# Patient Record
Sex: Female | Born: 1950 | Race: White | Hispanic: No | Marital: Married | Smoking: Former smoker
Health system: Southern US, Community
[De-identification: ages and names within clinical notes are randomized; demographics above are authoritative.]

## PROBLEM LIST (undated history)

## (undated) DIAGNOSIS — Z8619 Personal history of other infectious and parasitic diseases: Secondary | ICD-10-CM

## (undated) DIAGNOSIS — R112 Nausea with vomiting, unspecified: Secondary | ICD-10-CM

## (undated) DIAGNOSIS — K219 Gastro-esophageal reflux disease without esophagitis: Secondary | ICD-10-CM

## (undated) DIAGNOSIS — L4050 Arthropathic psoriasis, unspecified: Secondary | ICD-10-CM

## (undated) DIAGNOSIS — M459 Ankylosing spondylitis of unspecified sites in spine: Secondary | ICD-10-CM

## (undated) DIAGNOSIS — J984 Other disorders of lung: Secondary | ICD-10-CM

## (undated) DIAGNOSIS — M8580 Other specified disorders of bone density and structure, unspecified site: Secondary | ICD-10-CM

## (undated) DIAGNOSIS — N6320 Unspecified lump in the left breast, unspecified quadrant: Secondary | ICD-10-CM

## (undated) DIAGNOSIS — F329 Major depressive disorder, single episode, unspecified: Secondary | ICD-10-CM

## (undated) DIAGNOSIS — C801 Malignant (primary) neoplasm, unspecified: Secondary | ICD-10-CM

## (undated) DIAGNOSIS — I10 Essential (primary) hypertension: Secondary | ICD-10-CM

## (undated) DIAGNOSIS — F419 Anxiety disorder, unspecified: Secondary | ICD-10-CM

## (undated) DIAGNOSIS — G4733 Obstructive sleep apnea (adult) (pediatric): Secondary | ICD-10-CM

## (undated) DIAGNOSIS — Z9889 Other specified postprocedural states: Secondary | ICD-10-CM

## (undated) DIAGNOSIS — F429 Obsessive-compulsive disorder, unspecified: Secondary | ICD-10-CM

## (undated) DIAGNOSIS — F32A Depression, unspecified: Secondary | ICD-10-CM

## (undated) DIAGNOSIS — K449 Diaphragmatic hernia without obstruction or gangrene: Secondary | ICD-10-CM

## (undated) DIAGNOSIS — I493 Ventricular premature depolarization: Secondary | ICD-10-CM

## (undated) DIAGNOSIS — Z8719 Personal history of other diseases of the digestive system: Secondary | ICD-10-CM

## (undated) DIAGNOSIS — Z85828 Personal history of other malignant neoplasm of skin: Secondary | ICD-10-CM

## (undated) DIAGNOSIS — G2401 Drug induced subacute dyskinesia: Secondary | ICD-10-CM

## (undated) DIAGNOSIS — M069 Rheumatoid arthritis, unspecified: Secondary | ICD-10-CM

## (undated) DIAGNOSIS — G4700 Insomnia, unspecified: Secondary | ICD-10-CM

## (undated) DIAGNOSIS — D649 Anemia, unspecified: Secondary | ICD-10-CM

## (undated) DIAGNOSIS — R0600 Dyspnea, unspecified: Secondary | ICD-10-CM

## (undated) DIAGNOSIS — M797 Fibromyalgia: Secondary | ICD-10-CM

## (undated) DIAGNOSIS — E785 Hyperlipidemia, unspecified: Secondary | ICD-10-CM

## (undated) HISTORY — DX: Other specified disorders of bone density and structure, unspecified site: M85.80

## (undated) HISTORY — DX: Fibromyalgia: M79.7

## (undated) HISTORY — PX: SPINAL FIXATION SURGERY W/ IMPLANT: SHX785

## (undated) HISTORY — DX: Diaphragmatic hernia without obstruction or gangrene: K44.9

## (undated) HISTORY — PX: BREAST EXCISIONAL BIOPSY: SUR124

## (undated) HISTORY — DX: Ankylosing spondylitis of unspecified sites in spine: M45.9

## (undated) HISTORY — DX: Other disorders of lung: J98.4

## (undated) HISTORY — DX: Insomnia, unspecified: G47.00

## (undated) HISTORY — DX: Ventricular premature depolarization: I49.3

## (undated) HISTORY — DX: Arthropathic psoriasis, unspecified: L40.50

## (undated) HISTORY — DX: Hyperlipidemia, unspecified: E78.5

## (undated) HISTORY — DX: Essential (primary) hypertension: I10

## (undated) HISTORY — DX: Gastro-esophageal reflux disease without esophagitis: K21.9

## (undated) HISTORY — DX: Depression, unspecified: F32.A

## (undated) HISTORY — DX: Obsessive-compulsive disorder, unspecified: F42.9

## (undated) HISTORY — DX: Rheumatoid arthritis, unspecified: M06.9

## (undated) HISTORY — DX: Drug induced subacute dyskinesia: G24.01

## (undated) HISTORY — DX: Major depressive disorder, single episode, unspecified: F32.9

## (undated) HISTORY — DX: Malignant (primary) neoplasm, unspecified: C80.1

---

## 1973-05-25 HISTORY — PX: BREAST SURGERY: SHX581

## 1989-05-25 HISTORY — PX: BUNIONECTOMY: SHX129

## 1993-05-25 HISTORY — PX: EYE SURGERY: SHX253

## 1995-05-26 HISTORY — PX: SHOULDER SURGERY: SHX246

## 1998-05-25 HISTORY — PX: CATARACT EXTRACTION W/ INTRAOCULAR LENS  IMPLANT, BILATERAL: SHX1307

## 1999-01-01 ENCOUNTER — Encounter: Payer: Self-pay | Admitting: Ophthalmology

## 1999-01-01 ENCOUNTER — Ambulatory Visit (HOSPITAL_COMMUNITY): Admission: RE | Admit: 1999-01-01 | Discharge: 1999-01-02 | Payer: Self-pay | Admitting: Ophthalmology

## 1999-10-27 ENCOUNTER — Ambulatory Visit (HOSPITAL_COMMUNITY): Admission: RE | Admit: 1999-10-27 | Discharge: 1999-10-28 | Payer: Self-pay | Admitting: Ophthalmology

## 1999-10-27 HISTORY — PX: OTHER SURGICAL HISTORY: SHX169

## 2000-07-27 ENCOUNTER — Encounter: Payer: Self-pay | Admitting: Family Medicine

## 2000-07-27 ENCOUNTER — Encounter: Admission: RE | Admit: 2000-07-27 | Discharge: 2000-07-27 | Payer: Self-pay | Admitting: Family Medicine

## 2000-08-03 ENCOUNTER — Encounter: Payer: Self-pay | Admitting: Family Medicine

## 2000-08-03 ENCOUNTER — Encounter: Admission: RE | Admit: 2000-08-03 | Discharge: 2000-08-03 | Payer: Self-pay | Admitting: Family Medicine

## 2000-09-09 ENCOUNTER — Other Ambulatory Visit: Admission: RE | Admit: 2000-09-09 | Discharge: 2000-09-09 | Payer: Self-pay | Admitting: *Deleted

## 2000-09-22 ENCOUNTER — Encounter: Payer: Self-pay | Admitting: *Deleted

## 2000-09-22 ENCOUNTER — Encounter: Admission: RE | Admit: 2000-09-22 | Discharge: 2000-09-22 | Payer: Self-pay | Admitting: *Deleted

## 2000-11-15 ENCOUNTER — Other Ambulatory Visit: Admission: RE | Admit: 2000-11-15 | Discharge: 2000-11-15 | Payer: Self-pay | Admitting: *Deleted

## 2000-11-15 ENCOUNTER — Encounter (INDEPENDENT_AMBULATORY_CARE_PROVIDER_SITE_OTHER): Payer: Self-pay

## 2003-04-11 ENCOUNTER — Ambulatory Visit (HOSPITAL_COMMUNITY): Admission: RE | Admit: 2003-04-11 | Discharge: 2003-04-11 | Payer: Self-pay | Admitting: Gastroenterology

## 2003-04-25 ENCOUNTER — Ambulatory Visit (HOSPITAL_COMMUNITY): Admission: RE | Admit: 2003-04-25 | Discharge: 2003-04-25 | Payer: Self-pay | Admitting: Specialist

## 2003-10-31 ENCOUNTER — Ambulatory Visit (HOSPITAL_BASED_OUTPATIENT_CLINIC_OR_DEPARTMENT_OTHER): Admission: RE | Admit: 2003-10-31 | Discharge: 2003-10-31 | Payer: Self-pay | Admitting: Otolaryngology

## 2004-02-20 ENCOUNTER — Encounter: Admission: RE | Admit: 2004-02-20 | Discharge: 2004-02-20 | Payer: Self-pay | Admitting: *Deleted

## 2004-03-03 ENCOUNTER — Observation Stay (HOSPITAL_COMMUNITY): Admission: RE | Admit: 2004-03-03 | Discharge: 2004-03-04 | Payer: Self-pay | Admitting: Orthopedic Surgery

## 2004-03-03 HISTORY — PX: KNEE ARTHROSCOPY: SUR90

## 2004-03-18 ENCOUNTER — Encounter: Admission: RE | Admit: 2004-03-18 | Discharge: 2004-03-18 | Payer: Self-pay | Admitting: *Deleted

## 2005-11-10 ENCOUNTER — Encounter: Admission: RE | Admit: 2005-11-10 | Discharge: 2005-11-10 | Payer: Self-pay | Admitting: Family Medicine

## 2005-12-10 ENCOUNTER — Encounter: Admission: RE | Admit: 2005-12-10 | Discharge: 2005-12-10 | Payer: Self-pay | Admitting: Gastroenterology

## 2005-12-14 ENCOUNTER — Inpatient Hospital Stay (HOSPITAL_COMMUNITY): Admission: RE | Admit: 2005-12-14 | Discharge: 2005-12-17 | Payer: Self-pay | Admitting: Orthopedic Surgery

## 2005-12-14 HISTORY — PX: TOTAL KNEE ARTHROPLASTY: SHX125

## 2006-02-04 ENCOUNTER — Ambulatory Visit (HOSPITAL_COMMUNITY): Admission: RE | Admit: 2006-02-04 | Discharge: 2006-02-04 | Payer: Self-pay | Admitting: Gastroenterology

## 2006-03-26 ENCOUNTER — Encounter: Admission: RE | Admit: 2006-03-26 | Discharge: 2006-03-26 | Payer: Self-pay | Admitting: Otolaryngology

## 2006-04-26 ENCOUNTER — Encounter (INDEPENDENT_AMBULATORY_CARE_PROVIDER_SITE_OTHER): Payer: Self-pay | Admitting: Specialist

## 2006-04-26 ENCOUNTER — Ambulatory Visit (HOSPITAL_COMMUNITY): Admission: RE | Admit: 2006-04-26 | Discharge: 2006-04-27 | Payer: Self-pay | Admitting: Otolaryngology

## 2006-04-26 HISTORY — PX: UVULOPALATOPHARYNGOPLASTY: SHX827

## 2006-06-25 LAB — HM DEXA SCAN

## 2006-07-02 ENCOUNTER — Encounter: Admission: RE | Admit: 2006-07-02 | Discharge: 2006-07-02 | Payer: Self-pay | Admitting: Family Medicine

## 2006-07-02 LAB — HM DEXA SCAN

## 2008-04-17 LAB — HM PAP SMEAR: HM Pap smear: NORMAL

## 2008-04-23 ENCOUNTER — Encounter: Admission: RE | Admit: 2008-04-23 | Discharge: 2008-04-23 | Payer: Self-pay | Admitting: Orthopaedic Surgery

## 2008-09-04 ENCOUNTER — Inpatient Hospital Stay (HOSPITAL_COMMUNITY): Admission: EM | Admit: 2008-09-04 | Discharge: 2008-09-07 | Payer: Self-pay | Admitting: Emergency Medicine

## 2009-01-03 ENCOUNTER — Encounter: Admission: RE | Admit: 2009-01-03 | Discharge: 2009-01-03 | Payer: Self-pay | Admitting: Orthopedic Surgery

## 2009-02-15 ENCOUNTER — Encounter: Admission: RE | Admit: 2009-02-15 | Discharge: 2009-02-15 | Payer: Self-pay | Admitting: Orthopedic Surgery

## 2009-03-01 ENCOUNTER — Encounter: Admission: RE | Admit: 2009-03-01 | Discharge: 2009-03-01 | Payer: Self-pay | Admitting: Family Medicine

## 2009-03-12 ENCOUNTER — Encounter: Admission: RE | Admit: 2009-03-12 | Discharge: 2009-03-12 | Payer: Self-pay | Admitting: Family Medicine

## 2010-01-13 ENCOUNTER — Ambulatory Visit: Payer: Self-pay | Admitting: Cardiology

## 2010-06-06 ENCOUNTER — Encounter
Admission: RE | Admit: 2010-06-06 | Discharge: 2010-06-06 | Payer: Self-pay | Source: Home / Self Care | Attending: Family Medicine | Admitting: Family Medicine

## 2010-06-06 LAB — HM MAMMOGRAPHY: HM Mammogram: NORMAL

## 2010-06-15 ENCOUNTER — Encounter: Payer: Self-pay | Admitting: Otolaryngology

## 2010-09-03 LAB — BASIC METABOLIC PANEL
BUN: 2 mg/dL — ABNORMAL LOW (ref 6–23)
BUN: 4 mg/dL — ABNORMAL LOW (ref 6–23)
BUN: 7 mg/dL (ref 6–23)
CO2: 28 mEq/L (ref 19–32)
CO2: 30 mEq/L (ref 19–32)
CO2: 32 mEq/L (ref 19–32)
Calcium: 8 mg/dL — ABNORMAL LOW (ref 8.4–10.5)
Calcium: 8.4 mg/dL (ref 8.4–10.5)
Calcium: 8.5 mg/dL (ref 8.4–10.5)
Chloride: 101 mEq/L (ref 96–112)
Chloride: 102 mEq/L (ref 96–112)
Chloride: 98 mEq/L (ref 96–112)
Creatinine, Ser: 0.56 mg/dL (ref 0.4–1.2)
Creatinine, Ser: 0.57 mg/dL (ref 0.4–1.2)
Creatinine, Ser: 0.65 mg/dL (ref 0.4–1.2)
GFR calc Af Amer: >60 mL/min (ref >60)
GFR calc Af Amer: >60 mL/min (ref >60)
GFR calc Af Amer: >60 mL/min (ref >60)
GFR calc non Af Amer: >60 mL/min (ref >60)
GFR calc non Af Amer: >60 mL/min (ref >60)
GFR calc non Af Amer: >60 mL/min (ref >60)
Glucose, Bld: 106 mg/dL — ABNORMAL HIGH (ref 70–99)
Glucose, Bld: 123 mg/dL — ABNORMAL HIGH (ref 70–99)
Glucose, Bld: 125 mg/dL — ABNORMAL HIGH (ref 70–99)
Potassium: 3.5 mEq/L (ref 3.5–5.1)
Potassium: 3.7 mEq/L (ref 3.5–5.1)
Potassium: 3.8 mEq/L (ref 3.5–5.1)
Sodium: 133 mEq/L — ABNORMAL LOW (ref 135–145)
Sodium: 136 mEq/L (ref 135–145)
Sodium: 137 mEq/L (ref 135–145)

## 2010-09-03 LAB — CBC
HCT: 28.6 % — ABNORMAL LOW (ref 36.0–46.0)
HCT: 30.5 % — ABNORMAL LOW (ref 36.0–46.0)
HCT: 31.9 % — ABNORMAL LOW (ref 36.0–46.0)
Hemoglobin: 10 g/dL — ABNORMAL LOW (ref 12.0–15.0)
Hemoglobin: 10.7 g/dL — ABNORMAL LOW (ref 12.0–15.0)
Hemoglobin: 10.9 g/dL — ABNORMAL LOW (ref 12.0–15.0)
MCHC: 34.4 g/dL (ref 30.0–36.0)
MCHC: 34.9 g/dL (ref 30.0–36.0)
MCHC: 35 g/dL (ref 30.0–36.0)
MCV: 94.7 fL (ref 78.0–100.0)
MCV: 95 fL (ref 78.0–100.0)
MCV: 96.1 fL (ref 78.0–100.0)
Platelets: 207 10*3/uL (ref 150–400)
Platelets: 217 10*3/uL (ref 150–400)
Platelets: 218 10*3/uL (ref 150–400)
RBC: 3.01 MIL/uL — ABNORMAL LOW (ref 3.87–5.11)
RBC: 3.22 MIL/uL — ABNORMAL LOW (ref 3.87–5.11)
RBC: 3.32 MIL/uL — ABNORMAL LOW (ref 3.87–5.11)
RDW: 13.7 % (ref 11.5–15.5)
RDW: 13.7 % (ref 11.5–15.5)
RDW: 13.9 % (ref 11.5–15.5)
WBC: 6.6 10*3/uL (ref 4.0–10.5)
WBC: 7.5 10*3/uL (ref 4.0–10.5)
WBC: 8.2 10*3/uL (ref 4.0–10.5)

## 2010-10-07 NOTE — Discharge Summary (Signed)
NAME:  Janet, Peterson                   ACCOUNT NO.:  [id number]   MEDICAL RECORD NO.:  [id number]          PATIENT TYPE:  INP   LOCATION:  5024                         FACILITY:  [facility]   PHYSICIAN:  Doralee Albino. Carola Frost, M.D. DATE OF BIRTH:  March 21, 1951   DATE OF ADMISSION:  09/04/2008  DATE OF DISCHARGE:  09/07/2008                               DISCHARGE SUMMARY   DISCHARGE DIAGNOSES:  1. Right trimalleolar ankle fracture dislocation.  2. Hypotension.  3. Hyponatremia, asymptomatic.   ADDITIONAL DISCHARGE DIAGNOSES:  1. Anxiety.  2. Obsessive-compulsive disorder.  3. Hypotension.  4. Gastroesophageal reflux disease.  5. Fibromyalgia.  6. History of migraine.  7. Psoriasis with psoriatic arthritis.  8. History of basal cell skin cancer.  9. History of sleep apnea not on continuous positive airway pressure.   PROCEDURE PERFORMED:  On September 04, 2008, ORIF right trimalleolar ankle  fracture without fixation of posterior lip.   BRIEF HISTORY AND HOSPITAL COURSE:  Janet Peterson is a pleasant 60 year old  Caucasian female who sustained a ground level fall while at home on the  early morning of September 04, 2008.  The patient was reportedly getting  something to eat when she fell in her kitchen resulting in a right ankle  fracture dislocation.  The patient reported that she did not recall  passing out or any other evidence suggestive of a syncopal episode prior  to that, she simply fell resulting in injury to her right ankle.  The  patient was brought to the [facility] for evaluation where it  was determined that she had a trimalleolar ankle fracture of the right  ankle.  The patient was seen by Dr. Jene Every who attempted  reduction which was partially successful and the patient was splinted.  Given the significant injury to her right ankle, Dr. Shelle Iron contacted Dr.  Carola Frost in the Orthopedic Trauma Service for consultation and definitive  management.  As such, the patient was  scheduled for surgery on the day  of her injury where she underwent without any significant complication.  The patient tolerated the procedure as described above very well.  She  was then transported to the PACU for recovery from anesthesia and was  transported up to the Orthopedic floor for continued observation and  pain control.  Postoperative day #1, the patient was doing very well.  She did continue to have some right ankle pain, but was improving and  also had a decreased need for PCA.   Vital signs and laboratory values were unremarkable.  Physical exam was  also unremarkable.  Distal motor and sensory functions were intact.  No  pain with passive motion of her toes were noted.  We did decrease her IV  fluids to [facility].  On postoperative day 1, she was also worked with  physical therapy on postoperative day 1 as well.  She continued with her  home medications as well.  Janet Peterson was also placed on Lovenox for DVT  prophylaxis on postoperative day 1 with anticipated continuation of  Lovenox for 14 days after surgery.  Given  her well controlled pain, we  did anticipate transitioning from IV to p.o. pain medications, and we  are hopeful for discharge either Thursday or Friday.  The patient worked  well with Physical Therapy on postoperative day 1 without any  significant issues.  I was contacted during the afternoon on  postoperative day #1 that the patient did have episodes of hypotension,  which her systolic blood pressures was in the 80s with a diastolic BP in  the 50s.  The patient was given a one time bolus of 250 mL of normal  saline and her blood pressure responded to the bolus.  In addition, her  PM antihypertensives were held.  We did discontinue her Robaxin and  decreased her narcotic dosage to limit additional factors that may be  contributing to her hypotension.  It did not appear based on her labs  that this was due to acute blood anemia as well.  On postoperative day  2,  the patient was feeling better.  Pain is still in her right leg, but  vastly improved.  Again episode of hypotension from the prior day was  noted and we discussed situation with the patient.  The patient did not  have any significant dizziness or lightheadedness.  No shortness of  breath or chest pain were noted.  Overall, the patient was doing much  better and no complications were noted.  Again, vital signs were stable.  On postoperative day 2, temperature 98.0, heart rate of 60, BP of 114/74  with a respiration of 18, and 95%.  On physical exam, again no  significant changes were noted.  The patient continued to do well with  physical therapy.  Durable medical equipment was ordered and obtained  for discharge to home on Friday.  On postoperative day 3, the patient  was feeling great.  No complaints, is ready to discharge home.  No  further episodes of significant hypotension were noted.  No numbness or  tingling.  No shortness of breath or chest pain.  No nausea, vomiting,  or diarrhea.  The patient was tolerating p.o. intake well and voiding  well.   PHYSICAL EXAMINATION:  VITAL SIGNS:  Temperature 97.2, heart rate 82,  respirations 18, 98% on room air, and BP 132/84.  GENERAL:  The patient is awake and alert, in no acute distress.  LUNGS:  Clear.  CARDIAC:  S1 and S2.  ABDOMEN:  Soft and nontender.  Positive bowel sounds.  EXTREMITIES:  Right lower extremity splint is clean, dry and intact.  Decreased edema is noted.  EHL, FHL, muscle tone, motor function is  intact.  Deep peroneal nerve, superficial peroneal nerve, and tibial  nerve sensory functions are intact.  No pain with passive range of  motion is noted.  Contralateral calf is nontender and supple.  Brisk  capillary refill is noted.  Extremities are warm.   LABORATORY DATA:  Sodium 133, potassium 3.7, chloride 98, bicarb 32, BUN  4, creatinine 0.57, and glucose 123.  White blood cell 7.5, hemoglobin  10.9, hematocrit  31.9, and platelets 217.   ASSESSMENT AND PLAN:  7. A 60 year old female status post fall with right trimalleolar ankle      fracture dislocation now postoperative day #3, right trimalleolar      ankle fracture.  The patient will be nonweightbearing, right lower      extremity.  PT and OT.  Full range of motion encouraged for swelling and pain  control.  Continued ice and elevation.  Given history of falls and fall  risk, okay for patient to use wheelchair to help with mobilization.  1. Hypotension.  Did appear to be isolated, but again did note that      the patient did have a BP of 96/62 at 16:30 yesterday;  however for      the remainder of the day, she was in the 110s to 130s over 70 to      80, remained asymptomatic as well.  I do feel that hypotension may      possibly be due to medications such as antihypertensives,      trazodone, and narcotics as well as Zanaflex, which are all      contributing to her episodes of hypotension thankfully in the      hospital.  We did get routine vital signs, which demonstrates these      episodes of hypotension and this actually may be occurring as well      at home leading to fall by the patient.  The medications coupled      with possible hypovolemia/postoperative third spacing from surgery      may also be contributing to hypotension as well, but again feel      that likely due to medications.  We strongly recommend that the      patient followup with her primary care physician to review      medication and to make adjustments as needed.  2. From an orthopedic standpoint, we will limit our narcotic use, but      she will still need narcotics to help control the pain, also      suggest Tylenol as supplement for additional pain.  3. Hypertension/hyperlipidemia/obsessive-compulsive      disorder/OT/insomnia.  The patient will continue with her home meds      and please see no 2, for pain Norco 5/325 one to two p.o. q.4-6 h.      as needed for  pain, will limit the use.  Tylenol may be used as a      supplement.  4. Fluids, electrolytes, nutrition.  Continue with regular diet and we      will discontinue all lines.  5. Deep venous thrombosis prophylaxis.  We will discharge home with      Lovenox x14 days.   DISPOSITION:  Discharge home today with home health PT.  Followup in 10-  14 days.  The patient should also follow up with her primary care  physician, Dr. Maida Sale at [facility].   DISCHARGE MEDICATIONS:  1. Norco 5/325 one to two p.o. q.4-6 h. as needed for pain.  2. Lovenox 40 mg 1 subcutaneous injection daily x14 days.  3. Over-the-counter stool softeners as needed and as directed.  4. The patient may also use over-the-counter Tylenol as needed for      pain 1-2 every 6 hours and instructed that she should not exceed      4000 mg of Tylenol a day and remember that each Norco pill has 325      mg of Tylenol.   ADDITIONAL MEDICATIONS:  The patient may resume,  1. Klor-Con 10 mEq 1 p.o. daily.  2. Hydrochlorothiazide 25 mg 1 p.o. daily.  3. Kapidex 60 mg 1 p.o. daily.  4. Paxil 40 mg 1 p.o. daily.  5. Coreg 12.5 mg 1 p.o. b.i.d.  6. Zanaflex 1 mg 1 p.o. b.i.d.  7. Zetia 10 mg 1 p.o. daily.  8. Midrin 1 p.o. as needed.  9. Trazodone 50 mg 1 p.o. daily at bedtime.  Again, the patient will      follow up with her PCP to evaluate the need for medications.  10.The patient is also on Boniva 150 mg 1 p.o. every month.  We will      continue to hold her Boniva as it is a bisphosphonate, which can      actually limit bone healing capabilities due to its effects on      osteoclast inhibition.  At this point in time, we need appropriate      osteoclast function to promote bone healing and remodeling.   DISCHARGE INSTRUCTIONS AND PLANS:  Janet Peterson did sustain a significant  injury to her right lower extremity; however, we were able to achieve  excellent fixation and excellent alignment of her fracture.  We were  able to  restore length, rotation, and appropriate alignment through open  reduction and internal fixation with plate osteosynthesis.  Janet Peterson  will be strictly nonweightbearing on her right lower extremity for the  next 6-8 weeks.  She will continue to remain in her splint for the next  2 weeks as well.  She should engage in good splint care and she will  prevent her splint from getting dirty or wet .  Again, she will follow  up in office in 2 weeks at which time we will remove the splint and  evaluate her healing as well as obtain x-rays of her right lower  extremity.  At that time, we will also remove her sutures from the  operative wounds.  We would anticipate at that time perhaps placing Ms.  Peterson in a removable boot versus short leg cast depending on healing.  If  placed in a removable boot, we will be able to work on range of motion  activities in the sagittal plane and we will refrain from frontal plane  activities.  Janet Peterson will be discharged on Lovenox for DVT prophylaxis  and will continue this for 14 days postoperatively.  Again, Janet Peterson  needs to follow up with her primary care physician to review the  necessity of her medications which we feel may be a significant  contributing factor to her frequent falls as many of these medications  have hypotension and  dizziness as a major side effect.  We are very hopeful that Janet Peterson  will fully recover from this fracture and will progress very well.  Ms.  Peterson should contact our office with any questions or concerns at [phone].  We also encourage her primary care physician to contact us as  well if she has any questions.      Mearl Latin, PA      Doralee Albino. Carola Frost, M.D.  Electronically Signed    KWP/MEDQ  D:  09/07/2008  T:  09/08/2008  Job:  [id number]   cc:   Jene Every, M.D.  Broadus John T. Pamalee Leyden, MD

## 2010-10-07 NOTE — H&P (Signed)
Janet Peterson, Janet Peterson                   ACCOUNT NO.:  [id number]   MEDICAL RECORD NO.:  [id number]          PATIENT TYPE:  INP   LOCATION:  5024                         FACILITY:  [facility]   PHYSICIAN:  Jene Every, M.D.    DATE OF BIRTH:  Mar 02, 1951   DATE OF ADMISSION:  09/04/2008  DATE OF DISCHARGE:                              HISTORY & PHYSICAL   CHIEF COMPLAINT:  Right ankle pain.   HISTORY:  This is a 60 year old female who fell in the kitchen this  morning after midnight, had an acute pain in the ankle and foot.  She  was seen in the emergency room diagnosed with a fracture dislocation of  the ankle; underwent attempted reduction in the emergency room which was  unsuccessful in terms of splinting.  The reduction apparently was easily  obtained, however, was maintained in the reduction that was the issue.  She was called for orthopedic consultation.   She reports some numbness and tingling noted into the foot __________  dislocated.   REVIEW OF SYSTEMS:  Negative for chest pain, shortness of breath,  headaches, etc.   PAST MEDICAL HISTORY:  Significant for osteoporosis, hypertension, COPD,  chronic back pain, depression, hyperlipidemia.  Tobacco occasional.  Alcoholic beverages occasional.   ALLERGIES:  None.   MEDICATIONS:  Boniva, hydrochlorothiazide, Paxil, Kapidex, Coreg,  Zanaflex, and Zetia.   PHYSICAL EXAMINATION:  GENERAL:  Healthy female in mild amount of  stress.  Mood and affect is appropriate.  Inspection of the foot and  ankle revealed a valgus tilt to the ankle.  Good capillary refill.  She  has dorsiflexion, plantar flexion, 1+ dorsalis pedis pulse.  Tingling is  noted in the lateral aspect of the foot.  No DVT, ipsilateral knee exam  was unremarkable as is the hip.  HEENT:  Within normal limits.  HEART:  Regular rate and rhythm.  PULMONARY:  Clear to auscultation.  ABDOMEN:  Soft and nontender.  Positive bowel sounds.   Radiographs of the ankle  demonstrates a fracture dislocation of the  ankle laterally, fracture of the fibula, widening of syndesmosis.   IMPRESSION:  1. Fracture dislocation of the right ankle.  2. Comorbidities including chronic obstructive pulmonary disease,      hypertension, osteoporosis, obesity, and smoking.   PLAN:  Former re-reduction followed by admission, edema control,  neurovascular check, and delayed ORIF.   The patient in the emergency room, appropriately monitored.  She was  given Propofol.  Following relaxation, there was a reduction maneuver  performed without difficulty, felt the relocation and held her in slight  supination and forward translation.  Then, we applied a splint.  Post-  splinting radiographs are pending.   She had good capillary refill following reduction.   Again, we will admit for preop clearance and possible ORIF or delayed  ORIF following edema reduction.  Discussed the risks and benefits of the  procedure with her husband and the patient.      Jene Every, M.D.  Electronically Signed     JB/MEDQ  D:  09/04/2008  T:  09/05/2008  Job:  [id number]

## 2010-10-07 NOTE — Op Note (Signed)
NAME:  SHALIE, SCHREMP                   ACCOUNT NO.:  [id number]   MEDICAL RECORD NO.:  [id number]          PATIENT TYPE:  INP   LOCATION:  5024                         FACILITY:  [facility]   PHYSICIAN:  Doralee Albino. Carola Frost, M.D. DATE OF BIRTH:  December 04, 1950   DATE OF PROCEDURE:  DATE OF DISCHARGE:                               OPERATIVE REPORT   PREOPERATIVE DIAGNOSIS:  Right trimalleolar fracture dislocation of the  ankle.   POSTOPERATIVE DIAGNOSIS:  Right trimalleolar fracture dislocation of the  ankle.   PROCEDURE:  Open reduction and internal fixation of right trimalleolar  ankle fracture without fixation of the posterior lip.   SURGEON:  Doralee Albino. Carola Frost, MD   ASSISTANT:  Mearl Latin, PA   ANESTHESIA:  General.   COMPLICATIONS:  None.   ESTIMATED BLOOD LOSS:  50 mL.   FINDINGS:  Partial-thickness scrape of the talar dome but no full-  thickness loss.   DISPOSITION:  PACU.   CONDITION:  Stable.   BRIEF SUMMERY AND INDICATION FOR PROCEDURE:  Janet Peterson is a very  pleasant 60 year old female who sustained a ground-level fall resulting  in right ankle fracture dislocation.  She underwent serial reduction  attempts by an ED physician as well as Dr. Jene Every from  Orthopedics.  Unfortunately, fracture was severely unstable and I was  consulted emergently to see if patient could proceed to the OR for  reduction of her ankle and internal fixation if soft tissues allow.  I  discussed with the patient preoperative risks and benefits of surgery  including the possibility of infection, nerve injury, vessel injury,  wound healing problems, arthritis, decreased range of motion, need for  further surgery, heart attack, stroke, DVT, PE, multiple others.  After  full discussion, she wished to proceed.   DESCRIPTION OF PROCEDURE:  Ms. Couts was taken to the operating room  after administration of preoperative antibiotics.  Her right lower  extremity was prepped and draped in usual  sterile fashion after  induction of general anesthesia.  I made a 10-cm incision along the  lateral malleolus, carried dissection carefully down to the periosteum,  which was left intact.  I then opened the medial side through a so-  called hockey-stick incision, which was curvilinear over the  anteromedial malleolus.  The fracture site was distracted on this side  and full evaluation of the articular surface and joint undertaken.  There was a scrape on the talar dome but no full-thickness loss such  that the bone could be visualized.  This was thoroughly irrigated by  removing any fragments.  Curettage was used on the bone ends and a  provisional reduction performed.  I then turned attention back to the  lateral side where the fracture site was likewise cleaned out with  curette lavage, anatomic reduction obtained and maintained with a clamp  provisionally with the help of my assistant Montez Morita.  A Mr. Renae Fickle  applied, maintained reduction, and retraction during instrumentation  from that point forward with three bicortical screws proximally, 3  cancellus screws distally.  The most proximal one  of which was a lag  screw.  This was placed in a buttressing fashion along the posterior  aspect of the fibula.  This restored the mortise and furthermore from  the CT scan the medial aspect of the lateral malleolus could be seen  maintaining reduction within the syndesmosis.  Final AP mortise and  lateral films confirmed appropriate reduction and replacement.  Mr. Renae Fickle  then assisted me with simultaneous wound closure to facilitate expedient  care to minimize operative time.  The patient was awaked from anesthesia  and transported to the PACU in stable condition.  Sterile gently  compressive dressing and posterior stirrup splint was applied prior to  taking her to the recovery room.   PROGNOSIS:  Ms. Crupi will be strictly nonweightbearing on the right  lower extremity.  She will be on DVT  prophylaxis with Lovenox for 10  days after discharge from the hospital.  She will have unrestricted  motion as soon as her wound is evaluated for healing instability, which  will be at her first postoperative followup in 10 days or so.  We  anticipate 1-2 days stay in the hospital.      Doralee Albino. Carola Frost, M.D.  Electronically Signed     MHH/MEDQ  D:  09/04/2008  T:  09/05/2008  Job:  [id number]

## 2010-10-10 ENCOUNTER — Other Ambulatory Visit: Payer: Self-pay | Admitting: Dermatology

## 2010-10-10 NOTE — Op Note (Signed)
Janet Peterson, Janet Peterson                   ACCOUNT NO.:  [id number]   MEDICAL RECORD NO.:  [id number]          PATIENT TYPE:  OBV   LOCATION:  1610                         FACILITY:  [facility]   PHYSICIAN:  Ollen Gross, M.D.    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   PROCEDURE:  Left knee arthroscopy with chondroplasty and lateral retinacular  release.   SURGEON:  Ollen Gross, M.D.   ASSISTANT:  None.   ANESTHESIA:  Spinal.   ESTIMATED BLOOD LOSS:  Minimal.   DRAINS:  Hemovac x1.   COMPLICATIONS:  None.   CONDITION:  Stable to the recovery room.   CLINICAL NOTE:  [name] is a 60 year old female with a long history of  progressively worsening left knee pain.  The pain is mostly anterior.  She  has had numerous nonoperative interventions which have failed.  On exam, she  has patellofemoral crepitus and lateral tilt.  Given the failure of  nonoperative management, she presents now for arthroscopic treatment with  lateral release.   PROCEDURE IN DETAIL:  After successful administration of spinal anesthetic,  a tourniquet is placed high on the left thigh, and left lower extremity  prepped and draped in the usual sterile fashion.  A standard superior medial  and inferolateral incisions were made.  Inflow cannula was passed  superomedially, and camera passed inferolaterally.  Arthroscopic  visualization proceeds.  On the surface of the patella, it has two focal  areas of grade 3 degenerative change, one at the superior pole of the  patella and one down inferior.  There is no focal chondral defect.  The rest  of the patella looks normal.  The trochlea appears normal.  The patella does  appear tilted laterally.  The  medial and lateral gutters are then  visualized, and there is no evidence of any synovitis or loose bodies.  Flexion and valgus force is applied at the knee, and the medial compartment  is entered, which looks perfectly normal.  A spinal needle is used to  localize the inferomedial portal.  A small incision is made, __________  placed, and the probe passed into the joint.  The medial meniscus probes  normally.  The intercondylar notch is visualized, and ACL appears and probes  normally.  The lateral compartment is entered, and it is normal also.   We then debrided the unstable cartilage on the under-surface of the patella,  back to a stable cartilaginous base.  There is no exposed bone.  I then  performed a lateral release, first by marking the junction of the superior  and lateral borders of the patella.  We then started the release from the  lateral side and then switched the ports, putting the camera lateral and the  working port medial.  We then completed the lateral release all the way  down  to the inferior medial portal.  I then decreased the pressure on the inflow  and stopped all minor bleeding with electrocautery.  Once this was  completed, then the arthroscopic equipment is removed from the inferior  portals, and 20 cc of 0.25% Marcaine with epinephrine was injected through  an inflow cannula.  The Hemovac drain is then threaded through an inflow  cannula, and the cannula removed.  The incision is closed, but the drain is  not sewn in.  The drain is hooked to suction.  A bulky sterile dressing is  then placed, and she is awakened and transported to the recovery room in  stable condition.      FA/MEDQ  D:  03/03/2004  T:  03/03/2004  Job:  [id number]

## 2010-10-10 NOTE — Discharge Summary (Signed)
Janet Peterson, Janet Peterson                   ACCOUNT NO.:  [id number]   MEDICAL RECORD NO.:  [id number]          PATIENT TYPE:  INP   LOCATION:  1617                         FACILITY:  [facility]   PHYSICIAN:  Ollen Gross, M.D.    DATE OF BIRTH:  11/10/1950   DATE OF ADMISSION:  12/14/2005  DATE OF DISCHARGE:  12/17/2005                                 DISCHARGE SUMMARY   ADMITTING DIAGNOSES:  1. Osteoarthritis left knee.  2. Obsessive-compulsive disorder.  3. History of migraines.  4. Anxiety.  5. History of bronchitis.  6. Hypertension.  7. Psoriasis.  8. Fibromyalgia.  9. Sleep apnea.  10.Psoriatic arthritis.  11.Reflux disease.  12.History of urinary tract infections.  13.History of basal cell skin cancer.   DISCHARGE DIAGNOSES:  1. Osteoarthritis left knee status post left total knee arthroplasty.  2. Acute blood loss anemia, did not require transfusion.  3. Mild hyponatremia, improved.  4. Obsessive-compulsive disorder.  5. History of migraines.  6. Anxiety.  7. History of bronchitis.  8. Hypertension.  9. Psoriasis.  10.Fibromyalgia.  11.Sleep apnea.  12.Psoriatic arthritis.  13.Reflux disease.  14.History of urinary tract infections.  15.History of basal cell skin cancer.   PROCEDURE:  December 14, 2005:  Left total knee.  Surgeon:  Dr. Lequita Halt.  Assistant:  Avel Peace, P.A.-C.  Tourniquet time 37 minutes.   CONSULTS:  None.   BRIEF HISTORY:  Ms. Gregg is a 60 year old female with known arthritis in  both knees, progressive worsening pain in the left knee with being  refractory to nonoperative management, now presents for a total knee.   LABORATORY DATA:  Preoperative CBC:  Hemoglobin of 12.0, hematocrit 35.0,  white cell count 5.9.  Postoperative hemoglobin 9.6, drifted down to 8.5.  Last noted at 8.4 and 24.2 hematocrit.  PT/PTT 13.1 and 30 respectively, INR  1.9.  Serial pro times followed.  Last noted PT/INR 26.1 and 2.3.  Chem  panel on admission:  Low sodium of  132, low potassium of 3.4.  Remaining  chem panel within normal limits.  Sodium did improve up to 135, potassium  improved up to 4.2, last noted at 3.8.  The remaining electrolytes remained  within normal limits.  Preoperative UA:  Cloudy, otherwise small leukocyte  esterase, few epithelial, 0-2 white cells.  Blood group/type O negative.   EKG, December 09, 2005:  Normal sinus rhythm, ST and T wave abnormalities,  consider anterolateral ischemic.  When compared to December 09, 2005, no  significant change.  Confirmed by Dr. Caprice Kluver.  Two-view chest, December 09, 2005:  No acute disease.   HOSPITAL COURSE:  Admitted to [facility], tolerated procedure  well.  Later transferred to the recovery room and then the orthopedic floor.  Started on PCA and p.o. analgesia for pain control following surgery.  Had a  fair amount of pain and a rough night following the surgery but still a  little bit better by the next morning.  She was a little bit drowsy which  was felt to be due to the IV narcotics.  Hemovac drain was pulled.  Had some  low sodium and pressure was on the lower side so she was bolused with fluids  and switched over to normal saline.  Did have excellent urinary output on  the day of surgery but it was minimal on the morning of day #1.  Urine  output did improve and her pressure improved with the fluids.  Hemoglobin  dropped down to 8.5.  She did not have any symptoms, started getting up with  physical therapy.  By day #2 she was already up ambulating 60 and 90 feet.  Dressing changed, incision looked excellent.  Continued to progress well.  By day #3 was feeling better, no complaints, progressing with physical  therapy, and was ready to go home.   DISCHARGE PLAN:  1. The patient discharged home on December 17, 2005.  2. Discharge diagnoses:  Please see above.  3. Discharge medications:  Iron, Coumadin, Percocet, Robaxin.  4. Diet:  Resume previous home diet.  5. Followup:  Two  weeks.  6. Activity:  Total knee protocol.  Home health PT, home health nursing,      weightbearing as tolerated.   DISPOSITION:  Home.   CONDITION UPON DISCHARGE:  Improved.      Alexzandrew L. Julien Girt, P.A.      Ollen Gross, M.D.  Electronically Signed    ALP/MEDQ  D:  01/27/2006  T:  01/27/2006  Job:  [id number]   cc:   Ernestina Penna, M.D.  Fax: [phone]   Sanjeev K. Corliss Skains, M.D.  Fax: [phone]   Peter M. [place], M.D.  Fax: [phone]

## 2010-10-10 NOTE — Op Note (Signed)
NAME:  Janet Peterson, Janet Peterson                             ACCOUNT NO.:  [id number]   MEDICAL RECORD NO.:  [id number]                   PATIENT TYPE:  AMB   LOCATION:  ENDO                                 FACILITY:  [facility]   PHYSICIAN:  Graylin Shiver, M.D.                DATE OF BIRTH:  19-Jul-1950   DATE OF PROCEDURE:  04/11/2003  DATE OF DISCHARGE:                                 OPERATIVE REPORT   PROCEDURE:  Colonoscopy.   INDICATIONS FOR PROCEDURE:  Rectal bleeding.   CONSENT:  Informed consent was obtained after explanation of the risks of  bleeding, infection, and perforation.   PREMEDICATION:  The procedure was done immediately after an EGD with an  additional 40 mcg of Fentanyl given and 5 mg of Versed given.   PROCEDURE IN DETAIL:  With the patient in the left lateral decubitus  position, a rectal exam was performed and no masses were felt.  The Olympus  colonoscope was inserted into the rectum and advanced around the colon to  the cecum.  Cecal landmarks were identified.  The cecum and ascending colon  were normal.  The transverse colon was normal.  The descending colon,  sigmoid, and rectum were normal.  She tolerated the procedure well without  complications.   IMPRESSION:  Normal colonoscopy to the cecum.                                               Graylin Shiver, M.D.    SFG/MEDQ  D:  04/11/2003  T:  04/11/2003  Job:  [id number]   cc:   Reinaldo Raddle. Lance Bosch, M.D.  [facility]  [address]  [place]  [place] [address]  Fax: [phone]

## 2010-10-10 NOTE — H&P (Signed)
NAME:  Janet Peterson, Janet Peterson NO.:  [id number]   MEDICAL RECORD NO.:  [id number]           PATIENT TYPE:   LOCATION:                                FACILITY:  [facility]   PHYSICIAN:  Ollen Gross, M.D.         DATE OF BIRTH:   DATE OF ADMISSION:  DATE OF DISCHARGE:                                HISTORY & PHYSICAL   CHIEF COMPLAINT:  Left knee pain.   HISTORY OF PRESENT ILLNESS:  The patient is a 60 year old female who had  been for ongoing left knee pain who has a multiple year history of knee pain  that has been progressive in nature.  She has undergone arthroscopy and a  patella chondroplasty with lateral release.  This was back in 2005.  She had  degenerative changes at that point.  She did extremely well until the end of  2006 and since she has had increased pain.  She has undergone multiple  injections in the past with no benefit.  X-rays show significant medial  joint space narrowing where she is just about bone on bone, patellofemoral  narrowing with bone on bone.  It is felt she has reached the point where she  would benefit from undergoing surgical intervention.  Risks and benefits  were discussed.  The patient is subsequently admitted to the hospital.   ALLERGIES:  No known drug allergies.   CURRENT MEDICATIONS:  Hydrochlorothiazide, Diovan, Clonazepam, Paxil,  Nexium, Lipitor, Flexeril, Altace, Mobic, DuoDerm, Rozeram, Boniva.   PAST MEDICAL HISTORY:  1. Obsessive compulsive disorder.  2. History of migraines.  3. Anxiety.  4. History of bronchitis.  5. Hypertension.  6. Reflux disease.  7. History of UTIs.  8. Psoriasis.  9. Fibromyalgia.  10.Psoriatic arthritis.  11.History of sleep apnea.  12.History of basal cell skin cancer.   PAST SURGICAL HISTORY:  1. Lumpectomy, benign, 1975.  2. Ectopic pregnancy, 1982.  3. Cesarean section, 1985.  4. Right foot surgery, 1991.  5. Varicose veins, 1992.  6. Basal cell skin cancer excision, 1992.  7. Eye  surgery, bilateral, 1995.  8. Basal cell skin cancer again in 1997.  9. Shoulder surgery in 1997.  10.Detached retina, 2000.  11.Eye surgery, 2001.  12.Nose surgery, 2003.  13.Cataract surgery, 2004.  14.Knee surgery, 2005.  15.Also a third basal cell skin cancer removal in 1998.   SOCIAL HISTORY:  She is married, part time Engineer, technical sales.  Nonsmoker.  Two drinks  every couple of weeks.  She has a set of twin children.   REVIEW OF SYSTEMS:  GENERAL:  No fevers, chills, or night sweats.  NEUROLOGIC:  She does have obsessive compulsive disorder, anxiety, and  migraines.  No seizures, syncope, or paralysis.  RESPIRATORY:  No shortness  of breath, productive cough, or hemoptysis.  CARDIOVASCULAR:  She has  undergone cardiac workup back in 1997 for an EKG change and another workup  prior to her nose surgery in 2003 for EKG change.  Both workups were  negative.  No chest pain, angina, or orthopnea.  GASTROINTESTINAL:  No  nausea, vomiting, diarrhea, constipation.  GENITOURINARY:  No dysuria,  hematuria, discharge.  MUSCULOSKELETAL:  Left knee.   PHYSICAL EXAMINATION:  VITAL SIGNS:  Pulse 76, respiratory rate 12, blood  pressure 118/84.  GENERAL:  A 60 year old white female, well-nourished, well-developed, no  acute distress, alert, oriented, and cooperative, pleasant.  HEENT:  Normocephalic, atraumatic.  Pupils round and reactive.  Oropharynx  clear.  EOMs intact.  NECK:  Supple.  CHEST:  Clear.  HEART:  Regular rate and rhythm.  No murmur.  S1/S2 noted.  ABDOMEN:  Soft, slightly round.  Nontender.  Bowel sounds present.  RECTAL/BREASTS/GENITALIA:  Not done, not pertinent to present illness.  EXTREMITIES:  Left knee.  No effusion.  Range of motion 5-115.  No  instability.  Marked crepitus.   IMPRESSION:  1. Osteoarthritis, left knee.  2. Obsessive compulsive disorder.  3. History of migraines.  4. Anxiety.  5. History of bronchitis.  6. Hypertension.  7. Psoriasis.  8. Fibromyalgia.   9. History of sleep apnea.  10.History of psoriatic arthritis.  11.Reflux disease.  12.History of urinary tract infections.  13.History of basal cell skin cancer.   PLAN:  The patient is admitted to [facility] to undergo a left  total knee arthroplasty.  Surgery will be performed by Dr. Ollen Gross.      Alexzandrew L. Julien Girt, P.A.      Ollen Gross, M.D.  Electronically Signed    ALP/MEDQ  D:  12/13/2005  T:  12/13/2005  Job:  [id number]   cc:   Ollen Gross, M.D.  Fax: [phone]   Ernestina Penna, M.D.  Fax: [phone]   Sanjeev K. Corliss Skains, M.D.  Fax: [phone]   Peter M. [place], M.D.  Fax: [phone]

## 2010-10-10 NOTE — Op Note (Signed)
[place]  Patient:    Janet Peterson, Janet Peterson                          MRN: [id number] Proc. Date: 10/27/99 Adm. Date:  [id number] Disc. 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  POSTOPERATIVE DIAGNOSES: 1. 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 2. Retinal hole, right eye, inferior of the buckle with risk of reopening once    the vitrectomy had been performed.  PROCEDURE:  Posterior vitrectomy with focal laser photocoagulation of the right eye.  SURGEON:  Ernesto Rutherford, M.D.  ANESTHESIA:  Local retrobulbar with monitored anesthesia control.  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 All medical management _________ to improve her visual acuity had not failed. This is an attempt to improve her visual acuity and functioning via vitrectomy to release traction and remove vitreous debris and diminish the low grade proliferative vitreal retinopathy, grade A or B, potentially exacerbating the cystoid macular edema of the right eye.  The patient also understands the risks of anesthesia including the rare occurrence of death, but also to the eye including hemorrhage, infection, scarring, need for further surgery, no change in vision, loss of vision, or progression of the disease despite intervention.  DESCRIPTION OF PROCEDURE:  After appropriate signed consent was obtained, the patient was taken to the  operating room.  In the operating room, appropriate monitoring followed by mild sedation.  0.75% Marcaine, 5 cc retrobulbar delivered without difficulty into the right eye and an additional 5 cc laterally and fashioned in modified van Lint.  The right periocular region was sterilely prepped and draped in the usual ophthalmic fashion.  Lid speculum applied.  Conjunctival peritomy fashioned temporally and supranasally.  A 4 mm infusion was then secured 4 mm posterior to the limbus in the inferotemporal quadrant.  Placement in the vitreous cavity verified visually.  Superior sclerotomies were then fashioned.  A Wild microscope was placed in position. Core vitrectomy was then begun.  Vitreous skirt was trimmed 360 degrees over the slope of the anterior buckle.  Notable findings of the retinal flap inferiorly which is the initial cause of the retinal hole.  For safety, the decision was made to support the posterior edge of the retinal tear which was on the posterior slope of the buckle with focal laser photocoagulation so as to decrease the risk of redetachment.  Care was taken to avoid the natural lens injury.  At this time, the instruments were removed from the eye.  Superior sclerotomies were closed with 7-0 Vicryl suture.  The conjunctiva closed with 7-0 Vicryl suture after the infusion had been removed and ______ closed. Subconjunctival injection of antibiotic and steroid were applied.  The patient tolerated the procedure well without complications.  Notably, Decadron had been used in the infusion to minimize the intraocular inflammatory results.  The  patient had a sterile patch and Fox shield applied to the right eye and taken to the recovery room in good and stable condition after tolerating the procedure without complication. DD:  10/27/99 TD:  10/30/99 Job: [id number]

## 2010-10-10 NOTE — Op Note (Signed)
Janet, Peterson                   ACCOUNT NO.:  [id number]   MEDICAL RECORD NO.:  [id number]          PATIENT TYPE:  OIB   LOCATION:  3302                         FACILITY:  [facility]   PHYSICIAN:  Janet Peterson, M.D.DATE OF BIRTH:  06-28-1950   DATE OF PROCEDURE:  04/26/2006  DATE OF DISCHARGE:                               OPERATIVE REPORT   PREOPERATIVE DIAGNOSIS:  1. Obstructive sleep apnea.  2. Left-sided nasal obstruction.  3. Recurrent sinus infections with nasal obstruction.   POSTOPERATIVE DIAGNOSIS:  1. Obstructive sleep apnea.  2. Left-sided nasal obstruction.  3. Recurrent sinus infections with nasal obstruction.   OPERATION:  Functional endoscopic sinus surgery with bilateral anterior  ethmoidectomy, bilateral maxillary ostial enlargement.  Bilateral  inferior turbinate reductions.  Uvulopalatopharyngoplasty with  tonsillectomy.   SURGEON:  Janet Peterson, M.D.   ANESTHESIA:  General endotracheal anesthesia.   COMPLICATIONS:  None.   BRIEF CLINICAL NOTE:  Janet Peterson is a 60 year old female who has had a  long problem with trouble sleep pain.  She underwent a sleep test which  showed moderate obstructive sleep apnea with an RDI of 0 and O2 sats in  the 80%.  She also has problems with nasal obstruction, left side worse  than right.  She has had previous septoplasty and turbinate reductions a  number of years ago but still has some problems with left sided nasal  obstruction as well as a history of recurrent sinus infections.  Recent  CT scan showed minimal evidence of significant sinus disease.  The  patient continues to complain of chronic nasal congestion and pressure  in the perinasal area.  She is taken to the operating room at this time  for turbinate reductions and limited sinus procedure.  In addition,  uvulopalatopharyngoplasty with tonsillectomy will be done.   DESCRIPTION OF PROCEDURE:  After adequate endotracheal anesthesia, the  nose  was prepped with Betadine, draped out with sterile towels.  The  nose was then further prepped with Afrin soaked pledgets and the middle  turbinate were injected with Xylocaine with epinephrine.  Using the 0  degree endoscope, the right side was approached first.  The uncinate  process was incised and removed and the anterior ethmoid area was opened  up with straight through cut forceps.  Using the third degree scope, an  accessory maxillary ostia was identified on the right side.  The natural  ostia was identified with curved suction and using back biting and  straight through cut forceps, the accessory and the main maxillary  ostium on the right side were connected.  The maxillary sinus was clear  of any significant disease.  This completed the right side. On the left  side, again, the uncinate process was incised with a sickle knife and  anterior and a few of the posterior ethmoid cells were opened on the  left side.  The maxillary ostium was identified and enlarged slightly  with straight through cup  and back biting forceps.  This completed the  anterior ethmoidectomy and maxillary ostial enlargement.   Next, inferior turbinate reductions  were performed.  On the left side,  an incision was made along the inferior edge of the turbinate and the  mucosa was elevated off the turbinate bone and then, using scissors, the  turbinate bone and lateral turbinate mucosa was amputated.  Suction  cautery was used for hemostasis.  The procedure was repeated on the  right side.  Again, an incision was made along the inferior edge of the  turbinate.  The turbinate mucosa was elevated off of the medial aspect  of the turbinate bone and then the turbinate bone and inferior turbinate  mucosa was amputated.  The remaining posterior turbinate was out  fractured.  This completed the sinus portion of the procedure.  Sinus  packs were placed in the middle meatus bilaterally and the nose was  packed with  some Telfa soaked in bacitracin ointment which will be  removed later today.   A mouth gag was used to expose the oropharynx.  [name] had average sized  embedded tonsils bilaterally.  The tonsils were dissected from tonsillar  fossae.  Then, the uvula was transected at its base and the distal 0.5  cm of palate was incised along with the uvula.  4-0 Vicryl suture was  then used to reapproximate the mucosa on either side of the palate and  uvula.  Hemostasis was obtained with cautery.  The oropharynx was  irrigated with saline and the procedure was completed.  [name] was  awakened from anesthesia and transferred to recovery postop doing well.   DISPOSITION:  Patches will be observed in the step down unit and planned  for discharge in the morning on Lortab Elixir 1 tbs q.4h. p.r.n. pain,  amoxicillin suspension 500 mg b.i.d. for one week.  We will have her  follow up in my office in 10-14 days for recheck.           ______________________________  Janet Peterson, M.D.     CEN/MEDQ  D:  04/26/2006  T:  04/26/2006  Job:  [id number]   cc:   Janet Peterson, M.D.

## 2010-10-10 NOTE — Op Note (Signed)
NAMEARUSHI, Janet Peterson                   ACCOUNT NO.:  [id number]   MEDICAL RECORD NO.:  [id number]          PATIENT TYPE:  INP   LOCATION:  0007                         FACILITY:  [facility]   PHYSICIAN:  Ollen Gross, M.D.    DATE OF BIRTH:  Oct 24, 1950   DATE OF PROCEDURE:  12/14/2005  DATE OF DISCHARGE:                                 OPERATIVE REPORT   PREOPERATIVE DIAGNOSIS:  Osteoarthritis left knee.   POSTOP DIAGNOSIS:  Osteoarthritis left knee.   PROCEDURE:  Left total knee arthroplasty.   SURGEON:  Ollen Gross, M.D.   ASSISTANT:  Alexzandrew L. Julien Girt, P.A.   ANESTHESIA:  General with postop Marcaine pain pump.   ESTIMATED BLOOD LOSS:  Minimal.   DRAIN:  Hemovac x1.   TOURNIQUET:  37 minutes at 300 mmHg.   COMPLICATIONS:  None.   CONDITION:  Stable to recovery.   BRIEF CLINICAL NOTE:  Janet Peterson is a 60 year old female with known arthritic  changes in both knees.  She has progressively worsening pain in the left  knee refractory to nonoperative management.  She presents now for total knee  arthroplasty.   PROCEDURE IN DETAIL:  After successful administration of general anesthetic,  the tourniquet is placed high on the left thigh; and the left lower  extremity prepped and draped in the usual sterile fashion.  Extremities were  wrapped in Esmarch, knee flexed, and tourniquet inflated to 300 mmHg.  Standard midline incision made with the 10-blade through the subcutaneous  tissue to a level of the extensor mechanism.  A fresh blade is used to make  a medial parapatellar arthrotomy, then the soft tissue over the proximal  medial tibia subperiosteally elevated to the joint line with a knife, and  the semimembranosus bursa with a Cobb elevator.  Soft tissue was proximal  and lateral, tibia was also elevated with attention being paid to avoid the  patellar tendon and the tibial tubercle.  Patella subluxed laterally, knee  flexed 90 degrees, ACL, and PCL removed. The drill is  used to create a  starting hole in the  distal femur and the canal was thoroughly irrigated; 5  degrees left valgus alignment guide is placed referencing off the posterior  condyles.  Its rotations is marked and a block pinned to remove 10 mm of the  distal femur.  Distal femoral resection is made with an oscillating saw.  Sizing block is placed and size 3 is most appropriate.  Cutting block is  placed with rotation marked off the epicondylar axis.  The anterior-  posterior chamfer cuts are then made with a size 3.   Tibia is subluxed forward and the menisci removed.  Extramedullary tibial  alignment guide is placed referencing proximally at the medial aspect of the  tibial tubercle and distally along the second metatarsal axis and tibial  crest.  Blocks pinned to remove 10 mm of the nondeficient lateral side.  Tibial resection is made an oscillating saw.  A size 3 is the most  appropriate tibial component and the proximal tibia is prepared to modular  drill  and keel punch for a size 3.  Femoral preparation is completed with an  intercondylar cut.   The size 3 mobile bearing tibial trial and size 3 posterior stabilized  femoral trial, and a 10 mm posterior stabilized rotating platform insert  trial were placed.  With a 10 she hyperextends and is a little loose in  flexion.  With the 12/5 which allowed for full extension with excellent  varus and valgus balance throughout full range of motion.  Patella was  everted and thickness measured to be 22 mm.  Freehand resection is taken to  12 mm. A 38 template is placed, lug holes were drilled, trial patella is  placed and it tracks normally.  Osteophytes are then removed up the  posterior femur.  All trials are removed and the cut bone surfaces prepared  with pulsatile lavage.  Cement is mixed and once ready for implantation, the  size 3 mobile bearing tibial tray, size 3 posterior stabilized femur and 38  patella are cemented into place.  The  patella is held with a clamp.   A trial 12/5 inserts is placed and the knee held in full extension and all  extruded cement removed.  Once the cement is fully hardened, then the  permanent 12.5 mm posterior stabilized rotating platform insert is placed  into the tibial tray.  The wound is copiously irrigated with saline  solution; and the extensor mechanism closed over Hemovac drain with  interrupted #1 PDS.  Flexion against gravity is 135 degrees.  Tourniquets is  released with total time 37 minutes.  Subcu is closed with interrupted 2-0  Vicryl, and subcuticular running 4-0 Monocryl.  The catheter for the  Marcaine pain pump is placed; and the pump is initiated.  Drain is hooked to  suction; bulky sterile dressing applied.  She is placed into a knee  immobilizer, awakened, and transported to recovery in stable condition.      Ollen Gross, M.D.  Electronically Signed     FA/MEDQ  D:  12/14/2005  T:  12/14/2005  Job:  [id number]

## 2010-10-10 NOTE — Op Note (Signed)
NAME:  Janet Peterson, Janet Peterson                             ACCOUNT NO.:  [id number]   MEDICAL RECORD NO.:  [id number]                   PATIENT TYPE:  AMB   LOCATION:  ENDO                                 FACILITY:  [facility]   PHYSICIAN:  Graylin Shiver, M.D.                DATE OF BIRTH:  06-02-1950   DATE OF PROCEDURE:  04/11/2003  DATE OF DISCHARGE:                                 OPERATIVE REPORT   PROCEDURE:  Esophagogastroduodenoscopy.   INDICATIONS FOR PROCEDURE:  Chronic heartburn.   CONSENT:  Informed consent was obtained after explanation of the risks of  bleeding, infection, and perforation.   PREMEDICATION:  Fentanyl 100 mcg IV, Versed 8 mg IV.   PROCEDURE IN DETAIL:  With the patient in the left lateral decubitus  position, the Olympus gastroscope was inserted into the oropharynx and  passed into the esophagus.  It was advanced down the esophagus, into the  stomach, and into the duodenum.  The second portion of all the duodenum were  normal.  The stomach looked normal in its entirety.  The upper fundus and  cardia were also normal on retroflexion.  The scope was then straightened  and brought back.  The esophagogastric junction was at 36 cm and looked  normal.  The esophagus looked normal.  She tolerated the procedure well  without complications.   IMPRESSION:  Normal esophagogastroduodenoscopy.                                               Graylin Shiver, M.D.    SFG/MEDQ  D:  04/11/2003  T:  04/11/2003  Job:  [id number]   cc:   Reinaldo Raddle. Lance Bosch, M.D.  [facility]  [address]  [place]  [place] [address]  Fax: [phone]

## 2010-12-09 ENCOUNTER — Ambulatory Visit (INDEPENDENT_AMBULATORY_CARE_PROVIDER_SITE_OTHER): Payer: 59 | Admitting: Surgery

## 2010-12-09 ENCOUNTER — Encounter (INDEPENDENT_AMBULATORY_CARE_PROVIDER_SITE_OTHER): Payer: Self-pay | Admitting: Surgery

## 2010-12-09 VITALS — BP 138/92 | HR 60 | Temp 97.4°F | Ht 64.5 in | Wt 191.8 lb

## 2010-12-09 DIAGNOSIS — R1013 Epigastric pain: Secondary | ICD-10-CM

## 2010-12-09 NOTE — Patient Instructions (Signed)
You will be scheduled for an abdominal ultrasound and HIDA study to evaluate gallbladder function.  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 these are not causing your  abdominal discomfort and bloating.  Follow up after the above tests are done.

## 2010-12-09 NOTE — Progress Notes (Signed)
Janet Peterson is a 60 y.o. female.    Chief Complaint  Patient presents with  . Other    umbilical hernia    HPI HPI  The patient is sent today at the request of Dr. Evette Cristal due to abdominal discomfort and bloating. She was told she had a umbilical hernia she's had abdominal bloating after meals for a number of years. 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 She had her gallbladder workup about 4 years ago to evaluate her abdominal bloating and discomfort. Most of her discomfort in her epigastrium after meals. They last for a couple of hours and goes away. She was found to have a small umbilical hernia. The discomfort is dull. It goes away on its own after a couple of hours. Food makes it worse.   Past Medical History  Diagnosis Date  . 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 Broken ankle 08/2008    Past Surgical History  Procedure Date  . Bunionectomy 1991  . Breast surgery 1975    lump removed  . Cesarean section 1985  . Eye surgery 1995    rk (laser surgery), semi cornea transplant, detacted retina,  fluid removal  . Shoulder surgery 1997  . Nose surgery 2003  . Joint replacement 2007    left knee    Family History  Problem Relation Age of Onset  . Diabetes Mother   . Heart disease Mother   . Diabetes Father   . Anuerysm Father   . Diabetes Brother   . Heart disease Brother     Social History History  Substance Use Topics  . Smoking status: Passive Smoker  . Smokeless tobacco: Not on file  . Alcohol Use: Yes     special occasions    No Known Allergies  Current Outpatient Prescriptions  Medication Sig Dispense Refill  . clonazePAM (KLONOPIN) 1 MG tablet Take 1 mg by mouth daily.        Marland Kitchen desloratadine (CLARINEX) 5 MG tablet Take 5 mg by mouth daily.        Marland Kitchen dexlansoprazole (KAPIDEX) 60 MG capsule Take 60 mg by mouth daily.        Marland Kitchen ezetimibe (ZETIA) 10 MG tablet Take 10 mg by mouth daily.        .  hydrochlorothiazide 25 MG tablet Take 25 mg by mouth daily.        . mirtazapine (REMERON) 15 MG tablet Take 15 mg by mouth at bedtime.        Marland Kitchen PARoxetine (PAXIL) 40 MG tablet Take 40 mg by mouth every morning.        . SUMAtriptan (IMITREX) 50 MG tablet Take 50 mg by mouth as needed.        Marland Kitchen tiZANidine (ZANAFLEX) 4 MG capsule Take 4 mg by mouth 2 (two) times daily.        . traMADol (ULTRAM) 50 MG tablet Take 50 mg by mouth as needed.        . traZODone (DESYREL) 50 MG tablet Take 50 mg by mouth as needed.          Review of Systems Review of Systems  Constitutional: Negative.   HENT: Negative.   Eyes: Negative.   Respiratory: Negative.   Cardiovascular: Negative.   Gastrointestinal: Positive for heartburn, nausea, vomiting and abdominal pain.  Genitourinary: Negative.   Musculoskeletal: Positive for myalgias, back pain and joint pain.  Skin: Negative.   Neurological: Negative.   Endo/Heme/Allergies: Negative.   Psychiatric/Behavioral: Negative.     Physical Exam Physical Exam  Constitutional: She is oriented to person, place, and time. She appears well-developed and well-nourished.  HENT:  Head: Normocephalic and atraumatic.  Nose: Nose normal.  Eyes: Conjunctivae and EOM are normal. Pupils are equal, round, and reactive to light.  Neck: Normal range of motion. Neck supple.  Cardiovascular: Normal rate, regular rhythm and normal heart sounds.  Exam reveals no gallop.   No murmur heard. Respiratory: Effort normal and breath sounds normal.  GI: Soft. Bowel sounds are normal. There is no tenderness. There is no rebound and no guarding.       Abdominal wall diastasis noted.  Small umbilical hernia.   Musculoskeletal: Normal range of motion.  Neurological: She is alert and oriented to person, place, and time.  Skin: Skin is warm and dry.  Psychiatric: She has a normal mood and affect. Her behavior is normal. Judgment normal.     Blood pressure 138/92, pulse 60, temperature  97.4 F (36.3 C), temperature source Temporal, height 5' 4.5" (1.638 m), weight 191 lb 12.8 oz (87 kg).  Assessment/Plan Epigastric abdominal pain with bloating and small umbilical hernia uncomplicated  Plan: Her symptoms are not related to her small umbilical hernia. She has abdominal wall diastases and this is not related to her symptoms. I recommended working up her gallbladder function and to evaluate for gallstones. If this is normal, we can discuss repair of her umbilical hernia. I do not think the causing her symptoms. 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  She will follow up after the tests are done.  CORNETT,THOMAS A. 12/09/2010, 4:00 PM

## 2010-12-23 ENCOUNTER — Encounter: Payer: Self-pay | Admitting: Family Medicine

## 2010-12-23 DIAGNOSIS — F429 Obsessive-compulsive disorder, unspecified: Secondary | ICD-10-CM | POA: Insufficient documentation

## 2010-12-23 DIAGNOSIS — K219 Gastro-esophageal reflux disease without esophagitis: Secondary | ICD-10-CM | POA: Insufficient documentation

## 2010-12-23 DIAGNOSIS — I10 Essential (primary) hypertension: Secondary | ICD-10-CM | POA: Insufficient documentation

## 2010-12-23 LAB — HM DEXA SCAN

## 2010-12-25 ENCOUNTER — Encounter (HOSPITAL_COMMUNITY)
Admission: RE | Admit: 2010-12-25 | Discharge: 2010-12-25 | Disposition: A | Discharge status: Home / Self Care | Payer: 59 | Source: Ambulatory Visit | Attending: Surgery | Admitting: Surgery

## 2010-12-25 ENCOUNTER — Encounter (HOSPITAL_COMMUNITY): Payer: Self-pay

## 2010-12-25 DIAGNOSIS — R110 Nausea: Secondary | ICD-10-CM | POA: Insufficient documentation

## 2010-12-25 DIAGNOSIS — R109 Unspecified abdominal pain: Secondary | ICD-10-CM | POA: Insufficient documentation

## 2010-12-25 DIAGNOSIS — R1013 Epigastric pain: Secondary | ICD-10-CM

## 2010-12-25 MED ORDER — TECHNETIUM TC 99M MEBROFENIN IV KIT
5.5000 | PACK | Freq: Once | INTRAVENOUS | Status: AC | PRN
  Administered 2010-12-25: 5.5 via INTRAVENOUS

## 2010-12-25 MED ORDER — SINCALIDE 5 MCG IJ SOLR: 0.0200 ug/kg | Freq: Once | INTRAMUSCULAR | Status: DC

## 2011-01-16 ENCOUNTER — Encounter (INDEPENDENT_AMBULATORY_CARE_PROVIDER_SITE_OTHER): Payer: 59 | Admitting: Surgery

## 2011-02-06 ENCOUNTER — Encounter (INDEPENDENT_AMBULATORY_CARE_PROVIDER_SITE_OTHER): Payer: Self-pay | Admitting: Surgery

## 2011-02-17 ENCOUNTER — Ambulatory Visit (INDEPENDENT_AMBULATORY_CARE_PROVIDER_SITE_OTHER): Payer: 59 | Admitting: Cardiology

## 2011-02-17 ENCOUNTER — Encounter: Payer: Self-pay | Admitting: Cardiology

## 2011-02-17 VITALS — BP 152/116 | HR 80 | Ht 65.0 in | Wt 188.4 lb

## 2011-02-17 DIAGNOSIS — I4949 Other premature depolarization: Secondary | ICD-10-CM

## 2011-02-17 DIAGNOSIS — R55 Syncope and collapse: Secondary | ICD-10-CM | POA: Insufficient documentation

## 2011-02-17 DIAGNOSIS — I10 Essential (primary) hypertension: Secondary | ICD-10-CM

## 2011-02-17 DIAGNOSIS — I493 Ventricular premature depolarization: Secondary | ICD-10-CM

## 2011-02-17 NOTE — Assessment & Plan Note (Signed)
Blood pressure today is elevated but her readings at home have been quite acceptable. She is going to continue to monitor this.

## 2011-02-17 NOTE — Progress Notes (Signed)
Janet Peterson Date of Birth: 05-25-1951   History of Present Illness: Janet Peterson is seen for yearly followup. She has a history of syncope related to hypotension. This is exacerbated by beta blocker therapy. Since we stopped this therapy she has had no recurrent syncopal episodes. She denies any dizziness. She's had no palpitations or chest pain. She reports that her blood pressure at home has been under good control with readings typically of 126/80.  Current Outpatient Prescriptions on File Prior to Visit  Medication Sig Dispense Refill  . clonazePAM (KLONOPIN) 1 MG tablet Take 1 mg by mouth daily.        Marland Kitchen desloratadine (CLARINEX) 5 MG tablet Take 5 mg by mouth as needed.       Marland Kitchen dexlansoprazole (KAPIDEX) 60 MG capsule Take 60 mg by mouth daily.        Marland Kitchen ezetimibe (ZETIA) 10 MG tablet Take 10 mg by mouth daily.        . Gabapentin (NEURONTIN PO) Take by mouth 2 (two) times daily.        . hydrochlorothiazide 25 MG tablet Take 25 mg by mouth daily.        Marland Kitchen leflunomide (ARAVA) 20 MG tablet Take 20 mg by mouth daily.        Marland Kitchen PARoxetine (PAXIL) 40 MG tablet Take 40 mg by mouth every morning.        . SUMAtriptan (IMITREX) 50 MG tablet Take 50 mg by mouth as needed.        Marland Kitchen tiZANidine (ZANAFLEX) 4 MG capsule Take 4 mg by mouth 2 (two) times daily.        . traMADol (ULTRAM) 50 MG tablet Take 50 mg by mouth as needed.        . Zolpidem Tartrate (AMBIEN PO) Take by mouth as needed.          Allergies  Allergen Reactions  . Ace Inhibitors   . Augmentin   . Bextra (Valdecoxib)   . Lipitor (Atorvastatin Calcium)   . Lotrel   . Norvasc (Amlodipine Besylate)   . Sulfa Antibiotics   . Zocor (Simvastatin - High Dose)     Past Medical History  Diagnosis Date  . 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 Broken ankle 08/2008  . OCD (obsessive compulsive disorder)   . GERD (gastroesophageal reflux disease)   . Hypertension   . Allergy   . Insomnia   . Fibromyalgia   .  Arthritis   . Osteopenia   . Syncope   . PVC (premature ventricular contraction)     Past Surgical History  Procedure Date  . Bunionectomy 1991  . Breast surgery 1975    lump removed  . Cesarean section 1985  . Eye surgery 1995    rk (laser surgery), semi cornea transplant, detacted retina,  fluid removal  . Shoulder surgery 1997  . Nose surgery 2003  . Joint replacement 2007    left knee    History  Smoking status  . Passive Smoker  Smokeless tobacco  . Not on file    History  Alcohol Use  . Yes    special occasions    Family History  Problem Relation Age of Onset  . Diabetes Mother   . Heart disease Mother   . Diabetes Father   . Anuerysm Father   . Diabetes Brother   . Heart disease Brother     Review of Systems: The  review of systems is positive for chronic back pain. She has received several epidural injections. 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 She is currently taking Neurontin for this. She has gained 10 pounds this past year. All other systems were reviewed and are negative.  Physical Exam: BP 152/116  Pulse 80  Ht 5\' 5"  (1.651 m)  Wt 188 lb 6.4 oz (85.458 kg)  BMI 31.35 kg/m2 She is an overweight white female in no acute distress.The patient is alert and oriented x 3.  The mood and affect are normal.  The skin is warm and dry.  Color is normal.  The HEENT exam reveals that the sclera are nonicteric.  The mucous membranes are moist.  The carotids are 2+ without bruits.  There is no thyromegaly.  There is no JVD.  The lungs are clear.  The chest wall is non tender.  The heart exam reveals a regular rate with a normal S1 and S2.  There are no murmurs, gallops, or rubs.  The PMI is not displaced.   Abdominal exam reveals good bowel sounds.  There is no guarding or rebound.  There is no hepatosplenomegaly or tenderness.  There are no masses.  Exam of the legs reveal no clubbing, cyanosis, or edema.  The legs are without rashes.  The distal pulses are  intact.  Cranial nerves II - XII are intact.  Motor and sensory functions are intact.  The gait is normal.  LABORATORY DATA: ECG demonstrates sinus tachycardia with a rate of 105 beats per minute. She has diffuse nonspecific ST-T wave abnormalities.  Assessment / Plan:

## 2011-02-17 NOTE — Assessment & Plan Note (Signed)
Prior history of syncope related to hypotension. This has resolved. Would avoid beta blocker therapy in the future. I'll plan on seeing her back on a when necessary basis.

## 2011-02-17 NOTE — Patient Instructions (Signed)
Continue your current therapy  I will see you as needed. 

## 2011-04-24 ENCOUNTER — Other Ambulatory Visit: Payer: Self-pay | Admitting: Gastroenterology

## 2011-04-24 LAB — HM COLONOSCOPY

## 2011-07-24 HISTORY — PX: CERVICAL FUSION: SHX112

## 2011-09-07 ENCOUNTER — Other Ambulatory Visit: Payer: Self-pay | Admitting: Family Medicine

## 2011-09-07 DIAGNOSIS — Z1231 Encounter for screening mammogram for malignant neoplasm of breast: Secondary | ICD-10-CM

## 2011-09-18 ENCOUNTER — Ambulatory Visit
Admission: RE | Admit: 2011-09-18 | Discharge: 2011-09-18 | Disposition: A | Discharge status: Home / Self Care | Payer: 59 | Source: Ambulatory Visit | Attending: Family Medicine | Admitting: Family Medicine

## 2011-09-18 DIAGNOSIS — Z1231 Encounter for screening mammogram for malignant neoplasm of breast: Secondary | ICD-10-CM

## 2011-09-18 LAB — HM MAMMOGRAPHY: HM Mammogram: NORMAL

## 2011-10-21 LAB — HM PAP SMEAR: HM Pap smear: NORMAL

## 2012-01-15 ENCOUNTER — Other Ambulatory Visit: Payer: Self-pay | Admitting: Family Medicine

## 2012-01-15 ENCOUNTER — Ambulatory Visit
Admission: RE | Admit: 2012-01-15 | Discharge: 2012-01-15 | Disposition: A | Discharge status: Home / Self Care | Payer: 59 | Source: Ambulatory Visit | Attending: Family Medicine | Admitting: Family Medicine

## 2012-01-15 DIAGNOSIS — M545 Low back pain, unspecified: Secondary | ICD-10-CM

## 2012-08-12 ENCOUNTER — Ambulatory Visit (INDEPENDENT_AMBULATORY_CARE_PROVIDER_SITE_OTHER): Payer: 59 | Admitting: Family Medicine

## 2012-08-12 ENCOUNTER — Encounter: Payer: Self-pay | Admitting: Family Medicine

## 2012-08-12 VITALS — BP 128/71 | HR 88 | Temp 98.3°F | Resp 18 | Wt 187.0 lb

## 2012-08-12 DIAGNOSIS — N390 Urinary tract infection, site not specified: Secondary | ICD-10-CM

## 2012-08-12 DIAGNOSIS — M545 Low back pain, unspecified: Secondary | ICD-10-CM

## 2012-08-12 DIAGNOSIS — N760 Acute vaginitis: Secondary | ICD-10-CM

## 2012-08-12 DIAGNOSIS — IMO0001 Reserved for inherently not codable concepts without codable children: Secondary | ICD-10-CM

## 2012-08-12 DIAGNOSIS — I10 Essential (primary) hypertension: Secondary | ICD-10-CM

## 2012-08-12 DIAGNOSIS — M797 Fibromyalgia: Secondary | ICD-10-CM | POA: Insufficient documentation

## 2012-08-12 LAB — URINALYSIS, ROUTINE W REFLEX MICROSCOPIC
Bilirubin Urine: NEGATIVE
Glucose, UA: NEGATIVE mg/dL
Hgb urine dipstick: NEGATIVE
Ketones, ur: NEGATIVE mg/dL
Nitrite: NEGATIVE
Protein, ur: NEGATIVE mg/dL
Specific Gravity, Urine: 1.015 (ref 1.005–1.030)
Urobilinogen, UA: 0.2 mg/dL (ref 0.0–1.0)
pH: 7 (ref 5.0–8.0)

## 2012-08-12 LAB — URINALYSIS, MICROSCOPIC ONLY
Bacteria, UA: NONE SEEN
Casts: NONE SEEN
Crystals: NONE SEEN

## 2012-08-12 MED ORDER — METOPROLOL SUCCINATE ER 25 MG PO TB24: 25.0000 mg | ORAL_TABLET | Freq: Every day | ORAL | Status: DC

## 2012-08-12 MED ORDER — FLUCONAZOLE 150 MG PO TABS: 150.0000 mg | ORAL_TABLET | Freq: Once | ORAL | Status: DC

## 2012-08-12 NOTE — Progress Notes (Signed)
Subjective:     Patient ID: Janet Peterson, female   DOB: [birth year], 62 y.o.   MRN: [id number]  HPI  Problem #1 hypertension-  she is currently on losartan 100 mg by mouth daily and hydrochlorothiazide 25 mg by mouth daily.  She denies chest pain shortness of breath or dyspnea on exertion.  Blood pressure varies between 120 and 160/80-90.  The average is greater than 140/90. She also reports urinary hesitancy.  She reports vaginal itching.  She denies dysuria or hematuria.  He has chronic low back pain but she is not sure that is related to the urinary symptoms.  She denies fevers or chills Review of Systems    review of systems is otherwise negative Past Medical History  Diagnosis Date  . 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 Broken ankle 08/2008  . OCD (obsessive compulsive disorder)   . GERD (gastroesophageal reflux disease)   . Hypertension   . Allergy   . Insomnia   . Fibromyalgia   . Arthritis   . Osteopenia   . Syncope   . PVC (premature ventricular contraction)    Current Outpatient Prescriptions on File Prior to Visit  Medication Sig Dispense Refill  . clonazePAM (KLONOPIN) 1 MG tablet Take 1 mg by mouth daily.        Marland Kitchen desloratadine (CLARINEX) 5 MG tablet Take 5 mg by mouth as needed.       Marland Kitchen dexlansoprazole (KAPIDEX) 60 MG capsule Take 60 mg by mouth daily.        Marland Kitchen ezetimibe (ZETIA) 10 MG tablet Take 10 mg by mouth daily.        . hydrochlorothiazide 25 MG tablet Take 25 mg by mouth daily.        Marland Kitchen leflunomide (ARAVA) 20 MG tablet Take 20 mg by mouth daily.        Marland Kitchen PARoxetine (PAXIL) 40 MG tablet Take 40 mg by mouth every morning.        . SUMAtriptan (IMITREX) 50 MG tablet Take 50 mg by mouth as needed.        Marland Kitchen tiZANidine (ZANAFLEX) 4 MG capsule Take 4 mg by mouth 2 (two) times daily.        . traMADol (ULTRAM) 50 MG tablet Take 50 mg by mouth as needed.         No current facility-administered medications on file prior to visit.    Objective:   Physical Exam  Constitutional: She appears well-developed and well-nourished.  HENT:  Head: Normocephalic.  Right Ear: External ear normal.  Left Ear: External ear normal.  Eyes: Conjunctivae are normal. Pupils are equal, round, and reactive to light.  Cardiovascular: Normal rate, regular rhythm and normal heart sounds.   Pulmonary/Chest: Effort normal and breath sounds normal.  Abdominal: Soft. Bowel sounds are normal.   no CVA tenderness     Assessment:     Vaginitis Low back pain Hypertension     Plan:     Urinalysis shows no obvious sign of UTI I will send a urine culture.  Meanwhile treat vaginitis with Diflucan 150 mg by mouth x1. Toprol-XL 25 mg by mouth daily for hypertension.  Check blood pressure in one month.

## 2012-08-14 LAB — URINE CULTURE
Colony Count: NO GROWTH
Organism ID, Bacteria: NO GROWTH

## 2012-08-15 NOTE — Progress Notes (Signed)
Pt aware.

## 2012-08-23 ENCOUNTER — Encounter: Payer: Self-pay | Admitting: Family Medicine

## 2012-08-23 DIAGNOSIS — L4050 Arthropathic psoriasis, unspecified: Secondary | ICD-10-CM

## 2012-08-23 DIAGNOSIS — G2581 Restless legs syndrome: Secondary | ICD-10-CM

## 2012-08-23 DIAGNOSIS — E785 Hyperlipidemia, unspecified: Secondary | ICD-10-CM | POA: Insufficient documentation

## 2012-08-23 DIAGNOSIS — G4700 Insomnia, unspecified: Secondary | ICD-10-CM

## 2012-08-23 DIAGNOSIS — M8580 Other specified disorders of bone density and structure, unspecified site: Secondary | ICD-10-CM

## 2012-08-24 ENCOUNTER — Encounter: Payer: Self-pay | Admitting: Physician Assistant

## 2012-08-24 ENCOUNTER — Other Ambulatory Visit: Payer: Self-pay | Admitting: Physician Assistant

## 2012-08-24 ENCOUNTER — Ambulatory Visit (INDEPENDENT_AMBULATORY_CARE_PROVIDER_SITE_OTHER): Payer: 59 | Admitting: Physician Assistant

## 2012-08-24 VITALS — BP 120/70 | HR 76 | Temp 98.4°F | Resp 16 | Wt 188.0 lb

## 2012-08-24 DIAGNOSIS — N898 Other specified noninflammatory disorders of vagina: Secondary | ICD-10-CM

## 2012-08-24 DIAGNOSIS — L408 Other psoriasis: Secondary | ICD-10-CM

## 2012-08-24 DIAGNOSIS — A499 Bacterial infection, unspecified: Secondary | ICD-10-CM

## 2012-08-24 DIAGNOSIS — L409 Psoriasis, unspecified: Secondary | ICD-10-CM

## 2012-08-24 DIAGNOSIS — N760 Acute vaginitis: Secondary | ICD-10-CM

## 2012-08-24 DIAGNOSIS — B9689 Other specified bacterial agents as the cause of diseases classified elsewhere: Secondary | ICD-10-CM

## 2012-08-24 LAB — WET PREP FOR TRICH, YEAST, CLUE
Trich, Wet Prep: NONE SEEN
Yeast Wet Prep HPF POC: NONE SEEN

## 2012-08-24 MED ORDER — TRIAMCINOLONE ACETONIDE 0.1 % EX CREA: TOPICAL_CREAM | Freq: Two times a day (BID) | CUTANEOUS | Status: DC

## 2012-08-24 MED ORDER — METRONIDAZOLE 500 MG PO TABS: 500.0000 mg | ORAL_TABLET | Freq: Three times a day (TID) | ORAL | Status: DC

## 2012-08-24 NOTE — Progress Notes (Signed)
Patient ID: Janet Peterson MRN: [id number], DOB: 13-Jul-1950, 62 y.o. Date of Encounter: 08/24/2012, 2:40 PM    Chief Complaint:  Chief Complaint  Patient presents with  . Vaginal Itching    Has been going on for 2 months off and on  . Vaginal Discharge     HPI: 62 y.o. year old female has been experiencing vaginal irritation and vaginal discharge that is malodorous.     Home Meds: Current Outpatient Prescriptions on File Prior to Visit  Medication Sig Dispense Refill  . clonazePAM (KLONOPIN) 1 MG tablet Take 1 mg by mouth daily.        Marland Kitchen desloratadine (CLARINEX) 5 MG tablet Take 5 mg by mouth as needed.       . ezetimibe (ZETIA) 10 MG tablet Take 10 mg by mouth daily.        . Golimumab (SIMPONI) 50 MG/0.5ML SOLN Inject 50 mg into the skin every 30 (thirty) days.      . hydrochlorothiazide 25 MG tablet Take 25 mg by mouth daily.        Marland Kitchen leflunomide (ARAVA) 20 MG tablet Take 20 mg by mouth daily.        . methocarbamol (ROBAXIN) 750 MG tablet Take 750 mg by mouth 3 (three) times daily.      . metoprolol succinate (TOPROL-XL) 25 MG 24 hr tablet Take 1 tablet (25 mg total) by mouth daily.  30 tablet  3  . oxyCODONE-acetaminophen (PERCOCET) 5-325 MG per tablet Take 1 tablet by mouth every 4 (four) hours as needed for pain.      Marland Kitchen PARoxetine (PAXIL) 40 MG tablet Take 40 mg by mouth every morning.        . pravastatin (PRAVACHOL) 20 MG tablet Take 20 mg by mouth once a week.      . SUMAtriptan (IMITREX) 50 MG tablet Take 50 mg by mouth as needed.        Marland Kitchen tiZANidine (ZANAFLEX) 4 MG capsule Take 4 mg by mouth 2 (two) times daily.        . traMADol (ULTRAM) 50 MG tablet Take 50 mg by mouth as needed.        . traZODone (DESYREL) 150 MG tablet Take 150 mg by mouth at bedtime.       No current facility-administered medications on file prior to visit.    Allergies:  Allergies  Allergen Reactions  . Ace Inhibitors   . Amlodipine Besy-Benazepril Hcl   . Amoxicillin-Pot Clavulanate   .  Bextra (Valdecoxib)   . Lipitor (Atorvastatin Calcium)   . Norvasc (Amlodipine Besylate)   . Sulfa Antibiotics   . Zocor (Simvastatin - High Dose)       Review of Systems: Constitutional: negative for chills, fever, night sweats, weight changes, or fatigue  HEENT: negative for vision changes, hearing loss, congestion, rhinorrhea, ST, epistaxis, or sinus pressure Cardiovascular: negative for chest pain or palpitations Respiratory: negative for hemoptysis, wheezing, shortness of breath, or cough Abdominal: negative for abdominal pain, nausea, vomiting, diarrhea, or constipation Dermatological: negative for rash Neurologic: negative for headache, dizziness, or syncope    Physical Exam: Blood pressure 120/70, pulse 76, temperature 98.4 F (36.9 C), temperature source Oral, resp. rate 16, weight 188 lb (85.276 kg)., Body mass index is 31.28 kg/(m^2). General: Well developed, well nourished, in no acute distress. Neck: Supple. No thyromegaly. Full ROM. No lymphadenopathy. Lungs: Clear bilaterally to auscultation without wheezes, rales, or rhonchi. Breathing is unlabored. Heart: RRR with S1 S2.  No murmurs, rubs, or gallops appreciated. Abdomen: Soft, non-tender, non-distended with normoactive bowel sounds. No hepatomegaly. No rebound/guarding. No obvious abdominal masses. Pelvic: External Genitalia normal. She was unable to get in complete position because she recently had back surgery. Therefor I did not use speculum--just examined external genitalia and inserted swab without speculum. Bimanual exam nml-no cervical motion tenderness. No mass. Psych:  Responds to questions appropriately with a normal affect.     ASSESSMENT AND PLAN:  62 y.o. year old female with  1. Bacterial vaginosis Discussed pathophysiology of this and things to avoid to prevent recurrence. - metroNIDAZOLE (FLAGYL) 500 MG tablet; Take 1 tablet (500 mg total) by mouth 3 (three) times daily.  Dispense: 21 tablet;  Refill: 0  2. Vaginal discharge  - WET PREP FOR TRICH, YEAST, CLUE - GC/chlamydia probe amp, genital - metroNIDAZOLE (FLAGYL) 500 MG tablet; Take 1 tablet (500 mg total) by mouth 3 (three) times daily.  Dispense: 21 tablet; Refill: 0  3. Psoriasis Needs refill on cream. Says this works well. - triamcinolone cream (KENALOG) 0.1 %; Apply topically 2 (two) times daily.  Dispense: 30 g; Refill: 0   Signed, [address] [place], [place], [facility] 08/24/2012 2:40 PM

## 2012-08-25 LAB — GC/CHLAMYDIA PROBE AMP
CT Probe RNA: NEGATIVE
GC Probe RNA: NEGATIVE

## 2012-08-26 ENCOUNTER — Other Ambulatory Visit: Payer: Self-pay | Admitting: Family Medicine

## 2012-09-07 ENCOUNTER — Telehealth: Payer: Self-pay | Admitting: Physician Assistant

## 2012-09-07 DIAGNOSIS — N898 Other specified noninflammatory disorders of vagina: Secondary | ICD-10-CM

## 2012-09-07 DIAGNOSIS — B9689 Other specified bacterial agents as the cause of diseases classified elsewhere: Secondary | ICD-10-CM

## 2012-09-07 DIAGNOSIS — N760 Acute vaginitis: Secondary | ICD-10-CM

## 2012-09-07 MED ORDER — METRONIDAZOLE 500 MG PO TABS: 500.0000 mg | ORAL_TABLET | Freq: Three times a day (TID) | ORAL | Status: DC

## 2012-09-07 NOTE — Telephone Encounter (Signed)
At OV Wet Prep showed BV. Rxed Flagyl 500 for 7days.  Will give one more course of treatment.If does not resolve with this, will ntbs.  Send in Rx for : Flagyl 500mg  1 BID x 7 days # 14/0

## 2012-09-07 NOTE — Telephone Encounter (Signed)
Pt called told to repeat flagyl for another 7 days per provider.  Rx sent to pharmacy.  Pt told NTBS if not better after this course.

## 2012-09-19 ENCOUNTER — Other Ambulatory Visit (INDEPENDENT_AMBULATORY_CARE_PROVIDER_SITE_OTHER): Payer: 59

## 2012-09-19 DIAGNOSIS — Z0000 Encounter for general adult medical examination without abnormal findings: Secondary | ICD-10-CM

## 2012-09-19 DIAGNOSIS — E782 Mixed hyperlipidemia: Secondary | ICD-10-CM

## 2012-09-19 DIAGNOSIS — Z79899 Other long term (current) drug therapy: Secondary | ICD-10-CM

## 2012-09-20 LAB — LIPID PANEL
Cholesterol: 194 mg/dL (ref 0–200)
HDL: 49 mg/dL (ref >39)
LDL Cholesterol: 117 mg/dL — ABNORMAL HIGH (ref 0–99)
Total CHOL/HDL Ratio: 4 Ratio
Triglycerides: 141 mg/dL (ref <150)
VLDL: 28 mg/dL (ref 0–40)

## 2012-09-20 LAB — COMPREHENSIVE METABOLIC PANEL
ALT: <8 U/L (ref 0–35)
AST: 12 U/L (ref 0–37)
Albumin: 4.1 g/dL (ref 3.5–5.2)
Alkaline Phosphatase: 87 U/L (ref 39–117)
BUN: 6 mg/dL (ref 6–23)
CO2: 28 mEq/L (ref 19–32)
Calcium: 10.2 mg/dL (ref 8.4–10.5)
Chloride: 101 mEq/L (ref 96–112)
Creat: 0.62 mg/dL (ref 0.50–1.10)
Glucose, Bld: 116 mg/dL — ABNORMAL HIGH (ref 70–99)
Potassium: 4.3 mEq/L (ref 3.5–5.3)
Sodium: 139 mEq/L (ref 135–145)
Total Bilirubin: 0.4 mg/dL (ref 0.3–1.2)
Total Protein: 6.6 g/dL (ref 6.0–8.3)

## 2012-09-20 LAB — CBC WITH DIFFERENTIAL/PLATELET
Basophils Absolute: 0.1 K/uL (ref 0.0–0.1)
Basophils Relative: 1 % (ref 0–1)
Eosinophils Absolute: 0.1 K/uL (ref 0.0–0.7)
Eosinophils Relative: 2 % (ref 0–5)
HCT: 39.5 % (ref 36.0–46.0)
Hemoglobin: 13.2 g/dL (ref 12.0–15.0)
Lymphocytes Relative: 42 % (ref 12–46)
Lymphs Abs: 2.2 K/uL (ref 0.7–4.0)
MCH: 30 pg (ref 26.0–34.0)
MCHC: 33.4 g/dL (ref 30.0–36.0)
MCV: 89.8 fL (ref 78.0–100.0)
Monocytes Absolute: 0.5 K/uL (ref 0.1–1.0)
Monocytes Relative: 10 % (ref 3–12)
Neutro Abs: 2.4 K/uL (ref 1.7–7.7)
Neutrophils Relative %: 45 % (ref 43–77)
Platelets: 258 K/uL (ref 150–400)
RBC: 4.4 MIL/uL (ref 3.87–5.11)
RDW: 16 % — ABNORMAL HIGH (ref 11.5–15.5)
WBC: 5.3 K/uL (ref 4.0–10.5)

## 2012-09-20 LAB — ALT: ALT: <8 U/L (ref 0–35)

## 2012-09-20 LAB — ALBUMIN: Albumin: 4.1 g/dL (ref 3.5–5.2)

## 2012-09-28 ENCOUNTER — Telehealth: Payer: Self-pay | Admitting: Family Medicine

## 2012-09-28 DIAGNOSIS — I10 Essential (primary) hypertension: Secondary | ICD-10-CM

## 2012-09-28 MED ORDER — METOPROLOL SUCCINATE ER 25 MG PO TB24: 25.0000 mg | ORAL_TABLET | Freq: Every day | ORAL | Status: DC

## 2012-09-28 NOTE — Telephone Encounter (Signed)
Rx Refilled  

## 2012-10-27 ENCOUNTER — Encounter: Payer: Self-pay | Admitting: Physician Assistant

## 2012-10-27 ENCOUNTER — Ambulatory Visit (INDEPENDENT_AMBULATORY_CARE_PROVIDER_SITE_OTHER): Payer: 59 | Admitting: Physician Assistant

## 2012-10-27 VITALS — BP 150/96 | HR 80 | Temp 97.3°F | Resp 18 | Wt 180.0 lb

## 2012-10-27 DIAGNOSIS — J322 Chronic ethmoidal sinusitis: Secondary | ICD-10-CM

## 2012-10-27 MED ORDER — CEFDINIR 300 MG PO CAPS: 300.0000 mg | ORAL_CAPSULE | Freq: Two times a day (BID) | ORAL | Status: DC

## 2012-10-27 NOTE — Progress Notes (Signed)
Patient ID: Janet Peterson MRN: [id number], DOB: [birth year], 62 y.o. Date of Encounter: 10/27/2012, 2:45 PM    Chief Complaint:  Chief Complaint  Patient presents with  . c/o sinus infection/ fluids in ears     HPI: 62 y.o. year old female here for eval of sinus pain/pressure and head, nasal congestion. A lot of times, it jsu tfeels stopped up but occasionally able to get out mucu-thick green. Some drainage down throat but unable to cough out any phlegm/drainage. No chest congestion. No fever. No sore throat. Feels pressure behind ears but no earache.   Home Meds: See attached medication section for any medications that were entered at today's visit. The computer does not put those onto this list.The following list is a list of meds entered prior to today's visit.   Current Outpatient Prescriptions on File Prior to Visit  Medication Sig Dispense Refill  . clonazePAM (KLONOPIN) 1 MG tablet Take 1 mg by mouth daily.        Marland Kitchen desloratadine (CLARINEX) 5 MG tablet Take 5 mg by mouth as needed.       Marland Kitchen dexlansoprazole (DEXILANT) 60 MG capsule Take 60 mg by mouth daily.      Marland Kitchen ezetimibe (ZETIA) 10 MG tablet Take 10 mg by mouth daily.        . Golimumab (SIMPONI) 50 MG/0.5ML SOLN Inject 50 mg into the skin every 30 (thirty) days.      . hydrochlorothiazide 25 MG tablet Take 25 mg by mouth daily.        Marland Kitchen leflunomide (ARAVA) 20 MG tablet Take 20 mg by mouth daily.        Marland Kitchen losartan (COZAAR) 50 MG tablet TAKE 1 TABLET EVERY DAY  30 tablet  3  . methocarbamol (ROBAXIN) 750 MG tablet Take 750 mg by mouth 3 (three) times daily.      . metoprolol succinate (TOPROL-XL) 25 MG 24 hr tablet Take 1 tablet (25 mg total) by mouth daily.  30 tablet  5  . oxyCODONE-acetaminophen (PERCOCET) 5-325 MG per tablet Take 1 tablet by mouth every 4 (four) hours as needed for pain.      Marland Kitchen PARoxetine (PAXIL) 40 MG tablet Take 40 mg by mouth every morning.        . pravastatin (PRAVACHOL) 20 MG tablet Take 20 mg by mouth  once a week.      . SUMAtriptan (IMITREX) 50 MG tablet Take 50 mg by mouth as needed.        Marland Kitchen tiZANidine (ZANAFLEX) 4 MG capsule Take 4 mg by mouth 2 (two) times daily.        . traMADol (ULTRAM) 50 MG tablet Take 50 mg by mouth as needed.        . traZODone (DESYREL) 150 MG tablet Take 150 mg by mouth at bedtime.      . triamcinolone cream (KENALOG) 0.1 % Apply topically 2 (two) times daily.  30 g  0   No current facility-administered medications on file prior to visit.    Allergies:  Allergies  Allergen Reactions  . Ace Inhibitors   . Amlodipine Besy-Benazepril Hcl   . Amoxicillin-Pot Clavulanate   . Bextra (Valdecoxib)   . Lipitor (Atorvastatin Calcium)   . Norvasc (Amlodipine Besylate)   . Sulfa Antibiotics   . Zocor (Simvastatin - High Dose)       Review of Systems: See HPI for pertinent ROS. All other ROS negative.    Physical Exam: Blood pressure  150/96, pulse 80, temperature 97.3 F (36.3 C), temperature source Oral, resp. rate 18, weight 180 lb (81.647 kg)., Body mass index is 29.95 kg/(m^2). General: WNWD WF.  Appears in no acute distress. HEENT: Normocephalic, atraumatic, eyes without discharge, sclera non-icteric, nares are without discharge. Bilateral auditory canals clear, TM's are without perforation, pearly grey and translucent with reflective cone of light bilaterally. Oral cavity moist, posterior pharynx without exudate, erythema, peritonsillar abscess. Bilateral maxillary sinuses with moderate tenderness with percussion.   Neck: Supple. No thyromegaly. No lymphadenopathy. Lungs: Clear bilaterally to auscultation without wheezes, rales, or rhonchi. Breathing is unlabored. Heart: Regular rhythm. No murmurs, rubs, or gallops. Msk:  Strength and tone normal for age. Extremities/Skin: No rashes or suspicious lesions. Neuro: Alert and oriented X 3. Moves all extremities spontaneously. Gait is normal. CNII-XII grossly in tact. Psych:  Responds to questions  appropriately with a normal affect.     ASSESSMENT AND PLAN:  62 y.o. year old female with  1. Ethmoid sinusitis She reprots h/o GI upset with Augmentin. Reports that ZPack usually doesn't work for her. Will use Omnicef. - cefdinir (OMNICEF) 300 MG capsule; Take 1 capsule (300 mg total) by mouth 2 (two) times daily.  Dispense: 20 capsule; Refill: 0 She is using otc Mucinex-cont this. F/U if symtoms do not resolve.   [address] [place], [place], [facility] 10/27/2012 2:45 PM

## 2012-12-23 ENCOUNTER — Other Ambulatory Visit: Payer: Self-pay | Admitting: Family Medicine

## 2012-12-23 ENCOUNTER — Other Ambulatory Visit: Payer: 59

## 2012-12-23 DIAGNOSIS — Z79899 Other long term (current) drug therapy: Secondary | ICD-10-CM

## 2012-12-23 LAB — COMPLETE METABOLIC PANEL WITH GFR
ALT: 8 U/L (ref 0–35)
AST: 16 U/L (ref 0–37)
Albumin: 4 g/dL (ref 3.5–5.2)
Alkaline Phosphatase: 85 U/L (ref 39–117)
BUN: 11 mg/dL (ref 6–23)
CO2: 30 mEq/L (ref 19–32)
Calcium: 9.4 mg/dL (ref 8.4–10.5)
Chloride: 100 mEq/L (ref 96–112)
Creat: 0.74 mg/dL (ref 0.50–1.10)
GFR, Est African American: >89 mL/min
GFR, Est Non African American: 87 mL/min
Glucose, Bld: 101 mg/dL — ABNORMAL HIGH (ref 70–99)
Potassium: 3.7 mEq/L (ref 3.5–5.3)
Sodium: 138 mEq/L (ref 135–145)
Total Bilirubin: 0.5 mg/dL (ref 0.3–1.2)
Total Protein: 6.2 g/dL (ref 6.0–8.3)

## 2012-12-23 LAB — CBC WITH DIFFERENTIAL/PLATELET
Basophils Absolute: 0.1 10*3/uL (ref 0.0–0.1)
Basophils Relative: 1 % (ref 0–1)
Eosinophils Absolute: 0.1 10*3/uL (ref 0.0–0.7)
Eosinophils Relative: 3 % (ref 0–5)
HCT: 37.2 % (ref 36.0–46.0)
Hemoglobin: 12.5 g/dL (ref 12.0–15.0)
Lymphocytes Relative: 47 % — ABNORMAL HIGH (ref 12–46)
Lymphs Abs: 2.5 10*3/uL (ref 0.7–4.0)
MCH: 30.2 pg (ref 26.0–34.0)
MCHC: 33.6 g/dL (ref 30.0–36.0)
MCV: 89.9 fL (ref 78.0–100.0)
Monocytes Absolute: 0.7 10*3/uL (ref 0.1–1.0)
Monocytes Relative: 13 % — ABNORMAL HIGH (ref 3–12)
Neutro Abs: 2 10*3/uL (ref 1.7–7.7)
Neutrophils Relative %: 36 % — ABNORMAL LOW (ref 43–77)
Platelets: 237 10*3/uL (ref 150–400)
RBC: 4.14 MIL/uL (ref 3.87–5.11)
RDW: 14 % (ref 11.5–15.5)
WBC: 5.4 10*3/uL (ref 4.0–10.5)

## 2012-12-28 LAB — LIPID PANEL
Cholesterol: 192 mg/dL (ref 0–200)
HDL: 50 mg/dL (ref >39)
LDL Cholesterol: 114 mg/dL — ABNORMAL HIGH (ref 0–99)
Total CHOL/HDL Ratio: 3.8 Ratio
Triglycerides: 139 mg/dL (ref <150)
VLDL: 28 mg/dL (ref 0–40)

## 2013-02-28 ENCOUNTER — Other Ambulatory Visit (INDEPENDENT_AMBULATORY_CARE_PROVIDER_SITE_OTHER): Payer: 59

## 2013-02-28 DIAGNOSIS — Z23 Encounter for immunization: Secondary | ICD-10-CM

## 2013-03-01 ENCOUNTER — Other Ambulatory Visit: Payer: Self-pay | Admitting: Orthopaedic Surgery

## 2013-03-01 DIAGNOSIS — S22000A Wedge compression fracture of unspecified thoracic vertebra, initial encounter for closed fracture: Secondary | ICD-10-CM

## 2013-03-01 DIAGNOSIS — M419 Scoliosis, unspecified: Secondary | ICD-10-CM

## 2013-03-03 ENCOUNTER — Ambulatory Visit
Admission: RE | Admit: 2013-03-03 | Discharge: 2013-03-03 | Disposition: A | Discharge status: Home / Self Care | Payer: 59 | Source: Ambulatory Visit | Attending: Orthopaedic Surgery | Admitting: Orthopaedic Surgery

## 2013-03-03 VITALS — BP 114/62 | HR 62

## 2013-03-03 DIAGNOSIS — M419 Scoliosis, unspecified: Secondary | ICD-10-CM

## 2013-03-03 DIAGNOSIS — S22000A Wedge compression fracture of unspecified thoracic vertebra, initial encounter for closed fracture: Secondary | ICD-10-CM

## 2013-03-03 DIAGNOSIS — M545 Low back pain, unspecified: Secondary | ICD-10-CM

## 2013-03-03 MED ORDER — DIAZEPAM 5 MG PO TABS
10.0000 mg | ORAL_TABLET | Freq: Once | ORAL | Status: AC
  Administered 2013-03-03: 10 mg via ORAL

## 2013-03-03 MED ORDER — MORPHINE SULFATE 4 MG/ML IJ SOLN
6.0000 mg | Freq: Once | INTRAMUSCULAR | Status: AC
  Administered 2013-03-03: 6 mg via INTRAMUSCULAR

## 2013-03-03 MED ORDER — ONDANSETRON HCL 8 MG PO TABS
8.0000 mg | ORAL_TABLET | Freq: Once | ORAL | Status: AC
  Administered 2013-03-03: 8 mg via ORAL

## 2013-03-03 MED ORDER — IOHEXOL 300 MG/ML  SOLN
10.0000 mL | Freq: Once | INTRAMUSCULAR | Status: AC | PRN
  Administered 2013-03-03: 10 mL via INTRATHECAL

## 2013-03-03 MED ORDER — MEPERIDINE HCL 100 MG/ML IJ SOLN
75.0000 mg | Freq: Once | INTRAMUSCULAR | Status: AC
  Administered 2013-03-03: 75 mg via INTRAMUSCULAR

## 2013-03-03 NOTE — Progress Notes (Signed)
Patient states she has been off Imitrex, Paxil and Trazodone for the past 48 hours.  jkl

## 2013-03-13 HISTORY — PX: THORACIC FUSION: SHX1062

## 2013-04-17 ENCOUNTER — Ambulatory Visit (INDEPENDENT_AMBULATORY_CARE_PROVIDER_SITE_OTHER): Payer: 59 | Admitting: Physician Assistant

## 2013-04-17 ENCOUNTER — Encounter: Payer: Self-pay | Admitting: Physician Assistant

## 2013-04-17 VITALS — BP 134/86 | HR 80 | Temp 98.3°F | Resp 18 | Wt 179.0 lb

## 2013-04-17 DIAGNOSIS — B372 Candidiasis of skin and nail: Secondary | ICD-10-CM

## 2013-04-17 MED ORDER — HYDROXYZINE HCL 50 MG PO TABS: ORAL_TABLET | ORAL | Status: DC

## 2013-04-17 MED ORDER — NYSTATIN 100000 UNIT/GM EX CREA: 1.0000 "application " | TOPICAL_CREAM | Freq: Two times a day (BID) | CUTANEOUS | Status: DC

## 2013-04-17 NOTE — Progress Notes (Signed)
Patient ID: Janet Peterson MRN: [id number], DOB: 01-06-1951, 62 y.o. Date of Encounter: 04/17/2013, 3:08 PM    Chief Complaint:  Chief Complaint  Patient presents with  . rash in both axillas     HPI: 62 y.o. year old white female reports that she had surgery on her spine in October. Not sure if her rash is related to that. Just noticed this rash under both armpits 2 days ago. She's not sure if it may have been there prior to that. Has had minimal itching there. She has used no new deodarant or other products. Says it right after surgery she did have some times of feeling as if she was covered in a sweat.  She also reports that since the surgery she has had some area on her lower shins that feels itchy. She has seen no rash or skin lesions there. However just feels itchy. She has no other areas of itching on any other parts of her skin. No other areas of rash or skin lesions.     Home Meds: See attached medication section for any medications that were entered at today's visit. The computer does not put those onto this list.The following list is a list of meds entered prior to today's visit.   Current Outpatient Prescriptions on File Prior to Visit  Medication Sig Dispense Refill  . clonazePAM (KLONOPIN) 1 MG tablet Take 1 mg by mouth daily.        Marland Kitchen desloratadine (CLARINEX) 5 MG tablet Take 5 mg by mouth as needed.       Marland Kitchen dexlansoprazole (DEXILANT) 60 MG capsule Take 60 mg by mouth daily.      Marland Kitchen ezetimibe (ZETIA) 10 MG tablet Take 10 mg by mouth daily.        . Golimumab (SIMPONI) 50 MG/0.5ML SOLN Inject 50 mg into the skin every 30 (thirty) days.      . hydrochlorothiazide 25 MG tablet Take 25 mg by mouth daily.        Marland Kitchen leflunomide (ARAVA) 20 MG tablet Take 20 mg by mouth daily.        Marland Kitchen losartan (COZAAR) 50 MG tablet TAKE 1 TABLET EVERY DAY  30 tablet  3  . methocarbamol (ROBAXIN) 750 MG tablet Take 750 mg by mouth 3 (three) times daily.      . metoprolol succinate (TOPROL-XL) 25  MG 24 hr tablet Take 1 tablet (25 mg total) by mouth daily.  30 tablet  5  . oxyCODONE-acetaminophen (PERCOCET) 5-325 MG per tablet Take 1 tablet by mouth every 4 (four) hours as needed for pain.      Marland Kitchen PARoxetine (PAXIL) 40 MG tablet Take 40 mg by mouth every morning.        . pravastatin (PRAVACHOL) 20 MG tablet Take 20 mg by mouth once a week.      . SUMAtriptan (IMITREX) 50 MG tablet Take 50 mg by mouth as needed.        Marland Kitchen tiZANidine (ZANAFLEX) 4 MG capsule Take 4 mg by mouth 2 (two) times daily.        . traZODone (DESYREL) 150 MG tablet Take 150 mg by mouth at bedtime.      . traMADol (ULTRAM) 50 MG tablet Take 50 mg by mouth as needed.         No current facility-administered medications on file prior to visit.    Allergies:  Allergies  Allergen Reactions  . Amlodipine Besy-Benazepril Hcl   . Amoxicillin-Pot  Clavulanate   . Bextra [Valdecoxib]   . Lipitor [Atorvastatin Calcium]   . Norvasc [Amlodipine Besylate]   . Sulfa Antibiotics   . Zocor [Simvastatin - High Dose]   . Latex Rash      Review of Systems: See HPI for pertinent ROS. All other ROS negative.    Physical Exam: Blood pressure 134/86, pulse 80, temperature 98.3 F (36.8 C), temperature source Oral, resp. rate 18, weight 179 lb (81.194 kg)., Body mass index is 29.79 kg/(m^2). General: WNWD WF. Appears in no acute distress. Lungs: Clear bilaterally to auscultation without wheezes, rales, or rhonchi. Breathing is unlabored. Heart: Regular rhythm. No murmurs, rubs, or gallops. Msk:  Strength and tone normal for age. Extremities/Skin: Under both axilla, is an approximate 1-1/2 inch area of diffuse pink erythema. Her are no distinct skin lesions. On exam and her lower calves and shins where she says she has some itching. There is no rash and no skin lesions there. Skin appears normal. Neuro: Alert and oriented X 3. Moves all extremities spontaneously. Gait is normal. CNII-XII grossly in tact. Psych:  Responds to  questions appropriately with a normal affect.     ASSESSMENT AND PLAN:  62 y.o. year old female with  1. Candidiasis of skin - nystatin cream (MYCOSTATIN); Apply 1 application topically 2 (two) times daily.  Dispense: 30 g; Refill: 0 - hydrOXYzine (ATARAX/VISTARIL) 50 MG tablet; Take 1-2 up to 3 times a day as needed for itching.  Dispense: 30 tablet; Refill: 1   the areas in her axillae appear to be consistent with Candida.  Discussed with the patient that this comes from areas of moisture in skin folds. She needs to dry the area very well after her showers. To keep the areas dry with using deodorants. If the rash has not resolved with use of nystatin, and I think this is coming from a contact dermatitis. Then she would need to make further her clothing is not rubbing of the skin in that area or make sure that she's not having a reaction to any deodorant.  I have given her Atarax to use for the itching that she's having on her lower extremities. She says that she has an appointment to follow up with a surgeon in just a couple weeks. Told her to discuss this itching with the surgeon if it is not resolved by then.  Murray Hodgkins [place], [place], [facility] 04/17/2013 3:08 PM

## 2013-04-28 ENCOUNTER — Other Ambulatory Visit: Payer: Self-pay | Admitting: Family Medicine

## 2013-04-28 NOTE — Telephone Encounter (Signed)
Medication refilled per protocol. 

## 2013-05-20 ENCOUNTER — Other Ambulatory Visit: Payer: Self-pay | Admitting: Family Medicine

## 2013-05-30 ENCOUNTER — Other Ambulatory Visit: Payer: 59

## 2013-05-30 DIAGNOSIS — Z79899 Other long term (current) drug therapy: Secondary | ICD-10-CM

## 2013-05-30 LAB — COMPLETE METABOLIC PANEL WITH GFR
ALT: 9 U/L (ref 0–35)
AST: 22 U/L (ref 0–37)
Albumin: 3.9 g/dL (ref 3.5–5.2)
Alkaline Phosphatase: 116 U/L (ref 39–117)
BUN: 7 mg/dL (ref 6–23)
CO2: 28 mEq/L (ref 19–32)
Calcium: 9.7 mg/dL (ref 8.4–10.5)
Chloride: 99 mEq/L (ref 96–112)
Creat: 0.59 mg/dL (ref 0.50–1.10)
GFR, Est African American: >89 mL/min
GFR, Est Non African American: >89 mL/min
Glucose, Bld: 102 mg/dL — ABNORMAL HIGH (ref 70–99)
Potassium: 3.6 mEq/L (ref 3.5–5.3)
Sodium: 138 mEq/L (ref 135–145)
Total Bilirubin: 0.4 mg/dL (ref 0.3–1.2)
Total Protein: 6.6 g/dL (ref 6.0–8.3)

## 2013-05-30 LAB — CBC WITH DIFFERENTIAL/PLATELET
Basophils Absolute: 0 10*3/uL (ref 0.0–0.1)
Basophils Relative: 1 % (ref 0–1)
Eosinophils Absolute: 0.1 10*3/uL (ref 0.0–0.7)
Eosinophils Relative: 2 % (ref 0–5)
HCT: 36.5 % (ref 36.0–46.0)
Hemoglobin: 12.1 g/dL (ref 12.0–15.0)
Lymphocytes Relative: 41 % (ref 12–46)
Lymphs Abs: 2.5 10*3/uL (ref 0.7–4.0)
MCH: 28.4 pg (ref 26.0–34.0)
MCHC: 33.2 g/dL (ref 30.0–36.0)
MCV: 85.7 fL (ref 78.0–100.0)
Monocytes Absolute: 0.4 10*3/uL (ref 0.1–1.0)
Monocytes Relative: 7 % (ref 3–12)
Neutro Abs: 3 10*3/uL (ref 1.7–7.7)
Neutrophils Relative %: 49 % (ref 43–77)
Platelets: 265 10*3/uL (ref 150–400)
RBC: 4.26 MIL/uL (ref 3.87–5.11)
RDW: 14.2 % (ref 11.5–15.5)
WBC: 6.1 10*3/uL (ref 4.0–10.5)

## 2013-06-08 ENCOUNTER — Other Ambulatory Visit: Payer: 59

## 2013-06-08 DIAGNOSIS — Z79899 Other long term (current) drug therapy: Secondary | ICD-10-CM

## 2013-06-08 DIAGNOSIS — M899 Disorder of bone, unspecified: Secondary | ICD-10-CM

## 2013-06-08 DIAGNOSIS — M949 Disorder of cartilage, unspecified: Principal | ICD-10-CM

## 2013-06-09 LAB — VITAMIN D 25 HYDROXY (VIT D DEFICIENCY, FRACTURES): Vit D, 25-Hydroxy: 33 ng/mL (ref 30–89)

## 2013-06-12 ENCOUNTER — Encounter: Payer: Self-pay | Admitting: Family Medicine

## 2013-06-12 LAB — QUANTIFERON TB GOLD ASSAY (BLOOD)
Interferon Gamma Release Assay: NEGATIVE
Mitogen value: 2.82 IU/mL
Quantiferon Nil Value: 0.03 IU/mL
Quantiferon Tb Ag Minus Nil Value: 0 IU/mL
TB Ag value: 0.03 IU/mL

## 2013-06-26 ENCOUNTER — Ambulatory Visit (INDEPENDENT_AMBULATORY_CARE_PROVIDER_SITE_OTHER): Payer: 59 | Admitting: Physician Assistant

## 2013-06-26 ENCOUNTER — Encounter: Payer: Self-pay | Admitting: Physician Assistant

## 2013-06-26 ENCOUNTER — Telehealth: Payer: Self-pay | Admitting: Family Medicine

## 2013-06-26 VITALS — BP 148/106 | HR 80 | Temp 98.0°F | Resp 18 | Ht 63.5 in | Wt 180.0 lb

## 2013-06-26 DIAGNOSIS — H8110 Benign paroxysmal vertigo, unspecified ear: Secondary | ICD-10-CM

## 2013-06-26 DIAGNOSIS — R110 Nausea: Secondary | ICD-10-CM

## 2013-06-26 DIAGNOSIS — R42 Dizziness and giddiness: Secondary | ICD-10-CM

## 2013-06-26 DIAGNOSIS — R109 Unspecified abdominal pain: Secondary | ICD-10-CM

## 2013-06-26 LAB — CBC W/MCH & 3 PART DIFF
HCT: 43.9 % (ref 36.0–46.0)
Hemoglobin: 13.2 g/dL (ref 12.0–15.0)
Lymphocytes Relative: 41 % (ref 12–46)
Lymphs Abs: 2.5 10*3/uL (ref 0.7–4.0)
MCH: 28.3 pg (ref 26.0–34.0)
MCHC: 30.1 g/dL (ref 30.0–36.0)
MCV: 94 fL (ref 78.0–100.0)
Neutro Abs: 3 10*3/uL (ref 1.7–7.7)
Neutrophils Relative %: 48 % (ref 43–77)
Platelets: 245 10*3/uL (ref 150–400)
RBC: 4.67 MIL/uL (ref 3.87–5.11)
RDW: 18.2 % — ABNORMAL HIGH (ref 11.5–15.5)
WBC mixed population %: 11 % (ref 3–18)
WBC mixed population: 0.7 10*3/uL (ref 0.1–1.8)
WBC: 6.2 10*3/uL (ref 4.0–10.5)

## 2013-06-26 MED ORDER — MECLIZINE HCL 32 MG PO TABS: 32.0000 mg | ORAL_TABLET | Freq: Three times a day (TID) | ORAL | Status: DC | PRN

## 2013-06-26 MED ORDER — MECLIZINE HCL 25 MG PO TABS: 25.0000 mg | ORAL_TABLET | Freq: Three times a day (TID) | ORAL | Status: DC | PRN

## 2013-06-26 NOTE — Telephone Encounter (Signed)
Pharmacy questioned dose for RX meclizine.  Provider order 32 mg dose.  Pharmacy states that dose not available.  Per provider dose change to 25 mg and new RX to pharmacy

## 2013-06-26 NOTE — Progress Notes (Signed)
Patient ID: Janet Peterson MRN: [id number], DOB: Nov 14, 1950, 63 y.o. Date of Encounter: @DATE @  Chief Complaint:  Chief Complaint  Patient presents with  . stomach issues, dizzy    HPI: 63 y.o. year old white female  presents with above complaints.   Says that she has had problems with positional vertigo in the past. Says she recently had a couple of episodes of this. Was in bed and when she got up developed spinning sensation. Laid back down for a few minutes-resolved. When got up again, the spinning re-occurred. Has had few similar episodes over past several days.   Also reports that over the past couple weeks she has felt a little bit nauseous but says it has gotten much worse over the past 4 days.  States that the last meal she ate was on Friday night. Today it is now Monday. Says that since Friday night, she has only been able to eat several bites of food and then has to stop because she develops nausea. Has not actually vomited. Has had no diarrhea. Says that she is on pain medication and therefore uses a stool softener on a routine basis and therefore had some loose stools on a routine basis. No new changes in bowels.  She  does have psoriatic arthritis as well as rheumatoid arthritis and is on medications for this with Dr. Estanislado Pandy. However, she states that she is on no new rheumatology medications and she feels quite certain that those medications are not causing these side effects.  In regards to the pain medications she mentioned above, she states that her last surgery was 03/13/13. Said that they placed rods from S1 up to T4. She is on pain medications but has been on no increased medications recently. These are stable.  Says in the past she had a different type of abdominal pain and had every test imaginable by Dr. Penelope Coop. States she had endoscopy and colonoscopy approximately 2 years ago which were normal other than some polyps. Again, says that the pain she was experienceing  when she saw Dr. Penelope Coop in the past felt different than her current abd pain.    Past Medical History  Diagnosis Date  . 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 Broken ankle 08/2008  . OCD (obsessive compulsive disorder)   . GERD (gastroesophageal reflux disease)   . Hypertension   . Allergy   . Insomnia   . Fibromyalgia   . Arthritis   . Osteopenia   . Syncope   . PVC (premature ventricular contraction)   . Hyperlipidemia   . Psoriatic arthritis   . Rheumatoid arthritis      Home Meds: See attached medication section for current medication list. Any medications entered into computer today will not appear on this note's list. The medications listed below were entered prior to today. Current Outpatient Prescriptions on File Prior to Visit  Medication Sig Dispense Refill  . clonazePAM (KLONOPIN) 1 MG tablet Take 1 mg by mouth daily.        Marland Kitchen desloratadine (CLARINEX) 5 MG tablet Take 5 mg by mouth as needed.       Marland Kitchen dexlansoprazole (DEXILANT) 60 MG capsule Take 60 mg by mouth daily.      . Golimumab (SIMPONI) 50 MG/0.5ML SOLN Inject 50 mg into the skin every 30 (thirty) days.      . hydrochlorothiazide 25 MG tablet Take 25 mg by mouth daily.        Marland Kitchen  hydrOXYzine (ATARAX/VISTARIL) 50 MG tablet Take 1-2 up to 3 times a day as needed for itching.  30 tablet  1  . leflunomide (ARAVA) 20 MG tablet Take 20 mg by mouth daily.        . methocarbamol (ROBAXIN) 750 MG tablet Take 750 mg by mouth 3 (three) times daily.      . metoprolol succinate (TOPROL-XL) 25 MG 24 hr tablet TAKE 1 TABLET EVERY DAY  30 tablet  5  . nystatin cream (MYCOSTATIN) Apply 1 application topically 2 (two) times daily.  30 g  0  . oxyCODONE-acetaminophen (PERCOCET) 5-325 MG per tablet Take 1 tablet by mouth every 4 (four) hours as needed for pain.      . pravastatin (PRAVACHOL) 20 MG tablet Take 20 mg by mouth once a week.      . SUMAtriptan (IMITREX) 50 MG tablet Take 50 mg by mouth as needed.        Marland Kitchen  tiZANidine (ZANAFLEX) 4 MG capsule Take 4 mg by mouth 2 (two) times daily.        . traZODone (DESYREL) 150 MG tablet Take 150 mg by mouth at bedtime.      Marland Kitchen ZETIA 10 MG tablet TAKE 1 TABLET DAILY  90 tablet  3   No current facility-administered medications on file prior to visit.    Allergies:  Allergies  Allergen Reactions  . Amlodipine Besy-Benazepril Hcl   . Amoxicillin-Pot Clavulanate   . Bextra [Valdecoxib]   . Lipitor [Atorvastatin Calcium]   . Norvasc [Amlodipine Besylate]   . Sulfa Antibiotics   . Zocor [Simvastatin - High Dose]   . Latex Rash    History   Social History  . Marital Status: Married    Spouse Name: N/A    Number of Children: N/A  . Years of Education: N/A   Occupational History  . Not on file.   Social History Main Topics  . Smoking status: Passive Smoke Exposure - Never Smoker  . Smokeless tobacco: Not on file  . Alcohol Use: Yes     Comment: special occasions  . Drug Use: No  . Sexual Activity: Not on file   Other Topics Concern  . Not on file   Social History Narrative  . No narrative on file    Family History  Problem Relation Age of Onset  . Diabetes Mother   . Heart disease Mother   . Diabetes Father   . Anuerysm Father   . Diabetes Brother   . Heart disease Brother      Review of Systems:  See HPI for pertinent ROS. All other ROS negative.    Physical Exam: Blood pressure 148/106, pulse 80, temperature 98 F (36.7 C), temperature source Oral, resp. rate 18, height 5' 3.5" (1.613 m), weight 180 lb (81.647 kg)., Body mass index is 31.38 kg/(m^2). General: WNWD WF. Appears in no acute distress. Neck: Supple. No thyromegaly. No lymphadenopathy. Lungs: Clear bilaterally to auscultation without wheezes, rales, or rhonchi. Breathing is unlabored. Heart: RRR with S1 S2. No murmurs, rubs, or gallops. Abdomen: Soft,  non-distended with normoactive bowel sounds. No hepatomegaly. No rebound/guarding. No obvious abdominal  masses.There is a vertical scar. I asked what this is from and she reports secondary to past C-sections an ectopic pregnancy remotely. She has tenderness with palpation in the left lower abdomen, and tenderness in the left mid/upper abdomen.  Musculoskeletal:  Strength and tone normal for age. Extremities/Skin: Warm and dry. No clubbing or cyanosis. No  edema. No rashes or suspicious lesions. Neuro: Alert and oriented X 3. Moves all extremities spontaneously. Gait is normal. CNII-XII grossly in tact. Psych:  Responds to questions appropriately with a normal affect.   Results for orders placed in visit on 06/26/13  CBC [facility] & 3 PART DIFF      Result Value Range   WBC 6.2  4.0 - 10.5 K/uL   RBC 4.67  3.87 - 5.11 MIL/uL   Hemoglobin 13.2  12.0 - 15.0 g/dL   HCT 43.9  36.0 - 46.0 %   MCV 94.0  78.0 - 100.0 fL   MCH 28.3  26.0 - 34.0 pg   MCHC 30.1  30.0 - 36.0 g/dL   RDW 18.2 (*) 11.5 - 15.5 %   Platelets 245  150 - 400 K/uL   Neutrophils Relative % 48  43 - 77 %   Neutro Abs 3.0  1.7 - 7.7 K/uL   Lymphocytes Relative 41  12 - 46 %   Lymphs Abs 2.5  0.7 - 4.0 K/uL   WBC mixed population % 11  3 - 18 %   WBC mixed population 0.7  0.1 - 1.8 K/uL     ASSESSMENT AND PLAN:  63 y.o. year old female with  1. Nausea - CBC w/[facility] & 3 Part Diff - COMPLETE METABOLIC PANEL WITH GFR  2. Abdominal pain - CBC w/[facility] & 3 Part Diff - COMPLETE METABOLIC PANEL WITH GFR  CBC normal. Afebrile. No indication of bacterial infection/acute abdomen.  Will obtain CT Abdomen/Pelvis. This is being scheduled for tomorrow morning. If her symptoms significantly worsen in the interim she'll need to go to the emergency room.   3. Benign Positional Vertigo:  Meclizine 25mg  Q 6 hrs prn.   Marin Olp [place], [place], [facility] 06/26/2013 3:13 PM

## 2013-06-27 LAB — COMPLETE METABOLIC PANEL WITH GFR
ALT: 14 U/L (ref 0–35)
AST: 28 U/L (ref 0–37)
Albumin: 4.2 g/dL (ref 3.5–5.2)
Alkaline Phosphatase: 111 U/L (ref 39–117)
BUN: 5 mg/dL — ABNORMAL LOW (ref 6–23)
CO2: 31 mEq/L (ref 19–32)
Calcium: 9.8 mg/dL (ref 8.4–10.5)
Chloride: 97 mEq/L (ref 96–112)
Creat: 0.62 mg/dL (ref 0.50–1.10)
GFR, Est African American: >89 mL/min
GFR, Est Non African American: >89 mL/min
Glucose, Bld: 120 mg/dL — ABNORMAL HIGH (ref 70–99)
Potassium: 3.5 mEq/L (ref 3.5–5.3)
Sodium: 135 mEq/L (ref 135–145)
Total Bilirubin: 0.4 mg/dL (ref 0.2–1.2)
Total Protein: 6.8 g/dL (ref 6.0–8.3)

## 2013-06-28 ENCOUNTER — Ambulatory Visit
Admission: RE | Admit: 2013-06-28 | Discharge: 2013-06-28 | Disposition: A | Discharge status: Home / Self Care | Payer: 59 | Source: Ambulatory Visit | Attending: Physician Assistant | Admitting: Physician Assistant

## 2013-06-28 DIAGNOSIS — R110 Nausea: Secondary | ICD-10-CM

## 2013-06-28 DIAGNOSIS — R109 Unspecified abdominal pain: Secondary | ICD-10-CM

## 2013-06-28 MED ORDER — IOHEXOL 300 MG/ML  SOLN
100.0000 mL | Freq: Once | INTRAMUSCULAR | Status: AC | PRN
  Administered 2013-06-28: 100 mL via INTRAVENOUS

## 2013-07-26 ENCOUNTER — Other Ambulatory Visit: Payer: Self-pay | Admitting: Physician Assistant

## 2013-07-26 ENCOUNTER — Encounter: Payer: Self-pay | Admitting: Family Medicine

## 2013-07-26 NOTE — Telephone Encounter (Signed)
Medication refill for one time only.  Patient needs to be seen.  Letter sent for patient to call and schedule 

## 2013-08-15 ENCOUNTER — Other Ambulatory Visit: Payer: 59

## 2013-08-15 ENCOUNTER — Telehealth: Payer: Self-pay | Admitting: Family Medicine

## 2013-08-15 MED ORDER — HYDROCHLOROTHIAZIDE 25 MG PO TABS: 25.0000 mg | ORAL_TABLET | Freq: Every day | ORAL | Status: DC

## 2013-08-15 MED ORDER — DEXLANSOPRAZOLE 60 MG PO CPDR: 60.0000 mg | DELAYED_RELEASE_CAPSULE | Freq: Every day | ORAL | Status: DC

## 2013-08-15 NOTE — Telephone Encounter (Signed)
Rx Refilled  

## 2013-08-15 NOTE — Telephone Encounter (Signed)
Message copied by Alyson Locket on Tue Aug 15, 2013 11:08 AM ------      Message from: Kristine Garbe      Created: Tue Aug 15, 2013 10:48 AM       She needs new Rxs called in for Dexilant and HCTZ      She gets 3 mpmth supply at a time and she is now using      [facility] [place] [facility] instead of mail order ------

## 2013-08-17 ENCOUNTER — Telehealth: Payer: Self-pay | Admitting: Family Medicine

## 2013-08-17 MED ORDER — SUCRALFATE 1 G PO TABS
1.0000 g | ORAL_TABLET | Freq: Three times a day (TID) | ORAL | Status: DC
Start: 2013-08-17 — End: 2018-07-14

## 2013-08-17 NOTE — Telephone Encounter (Signed)
Medication refilled per protocol. 

## 2013-08-28 ENCOUNTER — Other Ambulatory Visit: Payer: 59

## 2013-08-28 DIAGNOSIS — Z79899 Other long term (current) drug therapy: Secondary | ICD-10-CM

## 2013-08-29 LAB — CBC WITH DIFFERENTIAL/PLATELET
Basophils Absolute: 0.1 10*3/uL (ref 0.0–0.1)
Basophils Relative: 1 % (ref 0–1)
Eosinophils Absolute: 0.1 10*3/uL (ref 0.0–0.7)
Eosinophils Relative: 1 % (ref 0–5)
HCT: 37.7 % (ref 36.0–46.0)
Hemoglobin: 12.6 g/dL (ref 12.0–15.0)
Lymphocytes Relative: 30 % (ref 12–46)
Lymphs Abs: 2.3 10*3/uL (ref 0.7–4.0)
MCH: 28.9 pg (ref 26.0–34.0)
MCHC: 33.4 g/dL (ref 30.0–36.0)
MCV: 86.5 fL (ref 78.0–100.0)
Monocytes Absolute: 0.7 10*3/uL (ref 0.1–1.0)
Monocytes Relative: 9 % (ref 3–12)
Neutro Abs: 4.5 10*3/uL (ref 1.7–7.7)
Neutrophils Relative %: 59 % (ref 43–77)
Platelets: 256 10*3/uL (ref 150–400)
RBC: 4.36 MIL/uL (ref 3.87–5.11)
RDW: 16.4 % — ABNORMAL HIGH (ref 11.5–15.5)
WBC: 7.7 10*3/uL (ref 4.0–10.5)

## 2013-08-29 LAB — COMPLETE METABOLIC PANEL WITH GFR
ALT: <8 U/L (ref 0–35)
AST: 17 U/L (ref 0–37)
Albumin: 3.9 g/dL (ref 3.5–5.2)
Alkaline Phosphatase: 92 U/L (ref 39–117)
BUN: 11 mg/dL (ref 6–23)
CO2: 30 mEq/L (ref 19–32)
Calcium: 9.7 mg/dL (ref 8.4–10.5)
Chloride: 98 mEq/L (ref 96–112)
Creat: 0.69 mg/dL (ref 0.50–1.10)
GFR, Est African American: >89 mL/min
GFR, Est Non African American: >89 mL/min
Glucose, Bld: 79 mg/dL (ref 70–99)
Potassium: 3.6 mEq/L (ref 3.5–5.3)
Sodium: 138 mEq/L (ref 135–145)
Total Bilirubin: 0.3 mg/dL (ref 0.2–1.2)
Total Protein: 6.3 g/dL (ref 6.0–8.3)

## 2013-11-21 ENCOUNTER — Encounter: Payer: Self-pay | Admitting: Family Medicine

## 2013-11-21 ENCOUNTER — Ambulatory Visit (INDEPENDENT_AMBULATORY_CARE_PROVIDER_SITE_OTHER): Payer: 59 | Admitting: Family Medicine

## 2013-11-21 VITALS — BP 140/84 | HR 76 | Temp 97.7°F | Resp 16 | Ht 64.0 in | Wt 184.0 lb

## 2013-11-21 DIAGNOSIS — R5383 Other fatigue: Principal | ICD-10-CM

## 2013-11-21 DIAGNOSIS — Z79899 Other long term (current) drug therapy: Secondary | ICD-10-CM

## 2013-11-21 DIAGNOSIS — R5381 Other malaise: Secondary | ICD-10-CM

## 2013-11-21 NOTE — Progress Notes (Signed)
Subjective:    Patient ID: Janet Peterson, female    DOB: 1951-04-11, 63 y.o.   MRN: [id number]  HPI Patient is a very pleasant 63 year old white female with a past medical history significant for fibromyalgia as well as rheumatoid arthritis. She is currently on simponi and arava for rheumatoid arthritis. She's been on both of these medications for more than 7 months. Last 64 months she reports worsening fatigue. Otherwise she is asymptomatic. She denies any fevers or chills or easy bruising. She denies any sudden weight loss or weight gain. She denies any palpitations. She denies any anxiety or depression. She denies any chest pain shortness of breath or dyspnea on exertion. She denies any melena or hematochezia. She denies any abdominal pain. She denies any recent infections. She does have a past history of obstructive sleep apnea that was treated with uvvp.  She has not been checked for obstructive sleep apnea in 8 or 9 years.  However she denies hypersomnolence. This is more fatigue. Past Medical History  Diagnosis Date  . 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 Broken ankle 08/2008  . OCD (obsessive compulsive disorder)   . GERD (gastroesophageal reflux disease)   . Hypertension   . Allergy   . Insomnia   . Fibromyalgia   . Arthritis   . Osteopenia   . Syncope   . PVC (premature ventricular contraction)   . Hyperlipidemia   . Psoriatic arthritis   . Rheumatoid arthritis    Past Surgical History  Procedure Laterality Date  . Bunionectomy  1991  . Breast surgery  1975    lump removed  . Cesarean section  1985  . Eye surgery  1995    rk (laser surgery), semi cornea transplant, detacted retina,  fluid removal  . Shoulder surgery  1997  . Nose surgery  2003  . Joint replacement  2007    left knee  . Spine surgery  03/13/13   Current Outpatient Prescriptions on File Prior to Visit  Medication Sig Dispense Refill  . clonazePAM (KLONOPIN) 1 MG tablet Take 1 mg by mouth  daily.        Marland Kitchen desloratadine (CLARINEX) 5 MG tablet Take 5 mg by mouth as needed.       Marland Kitchen dexlansoprazole (DEXILANT) 60 MG capsule Take 1 capsule (60 mg total) by mouth daily.  90 capsule  4  . Golimumab (SIMPONI) 50 MG/0.5ML SOLN Inject 50 mg into the skin every 30 (thirty) days.      . hydrochlorothiazide (HYDRODIURIL) 25 MG tablet Take 1 tablet (25 mg total) by mouth daily.  90 tablet  4  . hydrOXYzine (ATARAX/VISTARIL) 50 MG tablet Take 1-2 up to 3 times a day as needed for itching.  30 tablet  1  . leflunomide (ARAVA) 20 MG tablet Take 20 mg by mouth daily.        . meclizine (ANTIVERT) 25 MG tablet Take 1 tablet (25 mg total) by mouth 3 (three) times daily as needed for dizziness.  30 tablet  0  . methocarbamol (ROBAXIN) 750 MG tablet Take 750 mg by mouth 3 (three) times daily.      . metoprolol succinate (TOPROL-XL) 25 MG 24 hr tablet TAKE 1 TABLET EVERY DAY  30 tablet  5  . oxyCODONE-acetaminophen (PERCOCET) 5-325 MG per tablet Take 1 tablet by mouth every 4 (four) hours as needed for pain.      . pravastatin (PRAVACHOL) 20 MG tablet  Take 20 mg by mouth once a week.      . sucralfate (CARAFATE) 1 G tablet Take 1 tablet (1 g total) by mouth 4 (four) times daily -  with meals and at bedtime.  120 tablet  3  . SUMAtriptan (IMITREX) 50 MG tablet Take 50 mg by mouth as needed.        . traZODone (DESYREL) 150 MG tablet Take 150 mg by mouth at bedtime.      Marland Kitchen ZETIA 10 MG tablet TAKE 1 TABLET DAILY  90 tablet  3   No current facility-administered medications on file prior to visit.   Allergies  Allergen Reactions  . Amlodipine Besy-Benazepril Hcl   . Amoxicillin-Pot Clavulanate   . Bextra [Valdecoxib]   . Lipitor [Atorvastatin Calcium]   . Norvasc [Amlodipine Besylate]   . Sulfa Antibiotics   . Zocor [Simvastatin - High Dose]   . Latex Rash   History   Social History  . Marital Status: Married    Spouse Name: N/A    Number of Children: N/A  . Years of Education: N/A    Occupational History  . Not on file.   Social History Main Topics  . Smoking status: Passive Smoke Exposure - Never Smoker  . Smokeless tobacco: Not on file  . Alcohol Use: Yes     Comment: special occasions  . Drug Use: No  . Sexual Activity: Not on file   Other Topics Concern  . Not on file   Social History Narrative  . No narrative on file       Review of Systems  All other systems reviewed and are negative.      Objective:   Physical Exam  Vitals reviewed. Constitutional: She is oriented to person, place, and time. She appears well-developed and well-nourished. No distress.  HENT:  Nose: Nose normal.  Mouth/Throat: Oropharynx is clear and moist. No oropharyngeal exudate.  Eyes: Conjunctivae are normal. Pupils are equal, round, and reactive to light. No scleral icterus.  Neck: Neck supple. No JVD present. No thyromegaly present.  Cardiovascular: Normal rate, regular rhythm and normal heart sounds.  Exam reveals no gallop and no friction rub.   No murmur heard. Pulmonary/Chest: Effort normal and breath sounds normal. No respiratory distress. She has no wheezes. She has no rales. She exhibits no tenderness.  Abdominal: Soft. Bowel sounds are normal. She exhibits no distension and no mass. There is no tenderness. There is no rebound and no guarding.  Musculoskeletal: She exhibits no edema.  Lymphadenopathy:    She has no cervical adenopathy.  Neurological: She is alert and oriented to person, place, and time. She has normal reflexes. She displays normal reflexes. No cranial nerve deficit. She exhibits normal muscle tone. Coordination normal.  Skin: Skin is warm. No rash noted. She is not diaphoretic. No erythema.          Assessment & Plan:  1. Other malaise and fatigue  Complicated problem due to the lack of other symptoms. Differential diagnosis includes depression, malignancy, polypharmacy, anemia, hypothyroidism, vitamin deficiency, cardiovascular disease,  obstructive sleep apnea. However will evaluate the patient first with a CMP, CBC, TSH, vitamin B12, and an iron level. If lab work is completely normal, I would suggest a sleep study.  I have also asked the patient to monitor for any other symptoms that may point to a particular organ system as a cause of her fatigue - COMPLETE METABOLIC PANEL WITH GFR - CBC with Differential - TSH -  Vitamin B12 - Iron  2. Encounter for long-term (current) use of other medications - COMPLETE METABOLIC PANEL WITH GFR - CBC with Differential - TSH - Vitamin B12 - Iron

## 2013-11-22 LAB — TSH: TSH: 1.379 u[IU]/mL (ref 0.350–4.500)

## 2013-11-22 LAB — COMPLETE METABOLIC PANEL WITH GFR
ALT: 9 U/L (ref 0–35)
AST: 15 U/L (ref 0–37)
Albumin: 4.1 g/dL (ref 3.5–5.2)
Alkaline Phosphatase: 99 U/L (ref 39–117)
BUN: 6 mg/dL (ref 6–23)
CO2: 30 mEq/L (ref 19–32)
Calcium: 9.5 mg/dL (ref 8.4–10.5)
Chloride: 95 mEq/L — ABNORMAL LOW (ref 96–112)
Creat: 0.61 mg/dL (ref 0.50–1.10)
GFR, Est African American: >89 mL/min
GFR, Est Non African American: >89 mL/min
Glucose, Bld: 84 mg/dL (ref 70–99)
Potassium: 3.9 mEq/L (ref 3.5–5.3)
Sodium: 132 mEq/L — ABNORMAL LOW (ref 135–145)
Total Bilirubin: 0.3 mg/dL (ref 0.2–1.2)
Total Protein: 6.1 g/dL (ref 6.0–8.3)

## 2013-11-22 LAB — CBC WITH DIFFERENTIAL/PLATELET
Basophils Absolute: 0.1 10*3/uL (ref 0.0–0.1)
Basophils Relative: 1 % (ref 0–1)
Eosinophils Absolute: 0.2 10*3/uL (ref 0.0–0.7)
Eosinophils Relative: 3 % (ref 0–5)
HCT: 36.3 % (ref 36.0–46.0)
Hemoglobin: 12.3 g/dL (ref 12.0–15.0)
Lymphocytes Relative: 39 % (ref 12–46)
Lymphs Abs: 2 10*3/uL (ref 0.7–4.0)
MCH: 30.4 pg (ref 26.0–34.0)
MCHC: 33.9 g/dL (ref 30.0–36.0)
MCV: 89.9 fL (ref 78.0–100.0)
Monocytes Absolute: 0.6 10*3/uL (ref 0.1–1.0)
Monocytes Relative: 12 % (ref 3–12)
Neutro Abs: 2.3 10*3/uL (ref 1.7–7.7)
Neutrophils Relative %: 45 % (ref 43–77)
Platelets: 249 10*3/uL (ref 150–400)
RBC: 4.04 MIL/uL (ref 3.87–5.11)
RDW: 14.7 % (ref 11.5–15.5)
WBC: 5 10*3/uL (ref 4.0–10.5)

## 2013-11-22 LAB — IRON: Iron: 91 ug/dL (ref 42–145)

## 2013-11-22 LAB — VITAMIN B12: Vitamin B-12: 496 pg/mL (ref 211–911)

## 2013-11-29 ENCOUNTER — Other Ambulatory Visit: Payer: Self-pay | Admitting: Family Medicine

## 2013-12-13 ENCOUNTER — Other Ambulatory Visit: Payer: Self-pay

## 2013-12-13 DIAGNOSIS — Z1231 Encounter for screening mammogram for malignant neoplasm of breast: Secondary | ICD-10-CM

## 2013-12-18 ENCOUNTER — Ambulatory Visit
Admission: RE | Admit: 2013-12-18 | Discharge: 2013-12-18 | Disposition: A | Discharge status: Home / Self Care | Payer: 59 | Source: Ambulatory Visit

## 2013-12-18 DIAGNOSIS — Z1231 Encounter for screening mammogram for malignant neoplasm of breast: Secondary | ICD-10-CM

## 2013-12-19 ENCOUNTER — Encounter: Payer: Self-pay | Admitting: Family Medicine

## 2013-12-19 ENCOUNTER — Ambulatory Visit (INDEPENDENT_AMBULATORY_CARE_PROVIDER_SITE_OTHER): Payer: 59 | Admitting: Family Medicine

## 2013-12-19 VITALS — BP 126/94 | HR 86 | Temp 97.7°F | Resp 18 | Ht 64.0 in | Wt 181.0 lb

## 2013-12-19 DIAGNOSIS — I10 Essential (primary) hypertension: Secondary | ICD-10-CM

## 2013-12-19 NOTE — Progress Notes (Signed)
Subjective:    Patient ID: Janet Peterson, female    DOB: [birth year], 63 y.o.   MRN: [id number]  HPI Patient's blood  pressure has recently been running extremely high. Her blood pressure has been ranging 140-160/90-103.  Her heart rate has been averaging 90-100. She denies any chest pain shortness of breath or dyspnea on exertion.  Past Medical History  Diagnosis Date  . 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 Broken ankle 08/2008  . OCD (obsessive compulsive disorder)   . GERD (gastroesophageal reflux disease)   . Hypertension   . Allergy   . Insomnia   . Fibromyalgia   . Arthritis   . Osteopenia   . Syncope   . PVC (premature ventricular contraction)   . Hyperlipidemia   . Psoriatic arthritis   . Rheumatoid arthritis    Current Outpatient Prescriptions on File Prior to Visit  Medication Sig Dispense Refill  . clomiPRAMINE (ANAFRANIL) 50 MG capsule Take 50 mg by mouth 2 (two) times daily.       . clonazePAM (KLONOPIN) 1 MG tablet Take 1 mg by mouth daily.        Marland Kitchen desloratadine (CLARINEX) 5 MG tablet Take 5 mg by mouth as needed.       Marland Kitchen dexlansoprazole (DEXILANT) 60 MG capsule Take 1 capsule (60 mg total) by mouth daily.  90 capsule  4  . Golimumab (SIMPONI) 50 MG/0.5ML SOLN Inject 50 mg into the skin every 30 (thirty) days.      . hydrochlorothiazide (HYDRODIURIL) 25 MG tablet Take 1 tablet (25 mg total) by mouth daily.  90 tablet  4  . hydrOXYzine (ATARAX/VISTARIL) 50 MG tablet Take 1-2 up to 3 times a day as needed for itching.  30 tablet  1  . leflunomide (ARAVA) 20 MG tablet Take 20 mg by mouth daily.        . meclizine (ANTIVERT) 25 MG tablet Take 1 tablet (25 mg total) by mouth 3 (three) times daily as needed for dizziness.  30 tablet  0  . methocarbamol (ROBAXIN) 750 MG tablet Take 750 mg by mouth 3 (three) times daily.      . metoprolol succinate (TOPROL-XL) 25 MG 24 hr tablet TAKE 1 TABLET EVERY DAY  30 tablet  11  . oxyCODONE-acetaminophen (PERCOCET) 5-325  MG per tablet Take 1 tablet by mouth every 4 (four) hours as needed for pain.      . pravastatin (PRAVACHOL) 20 MG tablet Take 20 mg by mouth once a week.      . sucralfate (CARAFATE) 1 G tablet Take 1 tablet (1 g total) by mouth 4 (four) times daily -  with meals and at bedtime.  120 tablet  3  . SUMAtriptan (IMITREX) 50 MG tablet Take 50 mg by mouth as needed.        . traZODone (DESYREL) 150 MG tablet Take 150 mg by mouth at bedtime.      Marland Kitchen ZETIA 10 MG tablet TAKE 1 TABLET DAILY  90 tablet  3   No current facility-administered medications on file prior to visit.   Allergies  Allergen Reactions  . Amlodipine Besy-Benazepril Hcl   . Amoxicillin-Pot Clavulanate   . Bextra [Valdecoxib]   . Lipitor [Atorvastatin Calcium]   . Norvasc [Amlodipine Besylate]   . Sulfa Antibiotics   . Zocor [Simvastatin - High Dose]   . Latex Rash   History   Social History  . Marital Status: Married  Spouse Name: N/A    Number of Children: N/A  . Years of Education: N/A   Occupational History  . Not on file.   Social History Main Topics  . Smoking status: Passive Smoke Exposure - Never Smoker  . Smokeless tobacco: Not on file  . Alcohol Use: Yes     Comment: special occasions  . Drug Use: No  . Sexual Activity: Not on file   Other Topics Concern  . Not on file   Social History Narrative  . No narrative on file      Review of Systems  All other systems reviewed and are negative.      Objective:   Physical Exam  Vitals reviewed. Cardiovascular: Normal rate, regular rhythm and normal heart sounds.   No murmur heard. Pulmonary/Chest: Effort normal and breath sounds normal. No respiratory distress. She has no wheezes. She has no rales.          Assessment & Plan:  1. Essential hypertension  Increase Toprol-XL to 50 mg by mouth daily and recheck blood pressure in 2 weeks.

## 2013-12-20 ENCOUNTER — Telehealth: Payer: Self-pay | Admitting: Family Medicine

## 2013-12-20 MED ORDER — METOPROLOL SUCCINATE ER 50 MG PO TB24: 50.0000 mg | ORAL_TABLET | Freq: Every day | ORAL | Status: DC

## 2013-12-20 NOTE — Telephone Encounter (Signed)
Need rx for increased dose of Metoprolol - med sent to pharm and pt aware

## 2013-12-20 NOTE — Telephone Encounter (Signed)
Patient is calling to speak with you about her topral ? Please call her back at [phone]

## 2014-01-01 ENCOUNTER — Telehealth: Payer: Self-pay | Admitting: Family Medicine

## 2014-01-01 NOTE — Telephone Encounter (Signed)
Patient is calling to give dr pickard the average blood pressure she has been having to see if he needs to adjust the dosage of medication  It is 150/94 average

## 2014-01-02 MED ORDER — VALSARTAN 160 MG PO TABS: 160.0000 mg | ORAL_TABLET | Freq: Every day | ORAL | Status: DC

## 2014-01-02 NOTE — Telephone Encounter (Signed)
bp is too high.   I would add hctz 25 mg poqday and recheck bp in 1 month.

## 2014-01-02 NOTE — Telephone Encounter (Signed)
Call placed to patient.   States that she currently is taking HCTZ 67mcg PO QD.   States that MD had mentioned adding Diovan if needed.   MD please advise.

## 2014-01-02 NOTE — Telephone Encounter (Signed)
Prescription sent to pharmacy. .   Call placed to patient and patient made aware.  

## 2014-01-02 NOTE — Telephone Encounter (Signed)
diovan 160 poqday.

## 2014-01-09 ENCOUNTER — Other Ambulatory Visit: Payer: Self-pay | Admitting: Family Medicine

## 2014-03-05 ENCOUNTER — Other Ambulatory Visit: Payer: Self-pay | Admitting: Orthopaedic Surgery

## 2014-03-05 DIAGNOSIS — M4722 Other spondylosis with radiculopathy, cervical region: Secondary | ICD-10-CM

## 2014-03-07 ENCOUNTER — Other Ambulatory Visit: Payer: 59

## 2014-03-07 ENCOUNTER — Ambulatory Visit (INDEPENDENT_AMBULATORY_CARE_PROVIDER_SITE_OTHER): Payer: 59 | Admitting: Family Medicine

## 2014-03-07 DIAGNOSIS — Z23 Encounter for immunization: Secondary | ICD-10-CM

## 2014-03-08 ENCOUNTER — Ambulatory Visit
Admission: RE | Admit: 2014-03-08 | Discharge: 2014-03-08 | Disposition: A | Discharge status: Home / Self Care | Payer: 59 | Source: Ambulatory Visit | Attending: Orthopaedic Surgery | Admitting: Orthopaedic Surgery

## 2014-03-08 DIAGNOSIS — M4722 Other spondylosis with radiculopathy, cervical region: Secondary | ICD-10-CM

## 2014-03-09 ENCOUNTER — Other Ambulatory Visit: Payer: 59

## 2014-03-21 ENCOUNTER — Telehealth: Payer: Self-pay | Admitting: Family Medicine

## 2014-03-21 NOTE — Telephone Encounter (Signed)
PA submitted through [email or web address]  

## 2014-03-23 NOTE — Telephone Encounter (Signed)
Additional paperwork was sent by insurance and filled out and faxed back

## 2014-03-23 NOTE — Telephone Encounter (Signed)
Medication approved from 03/23/2014 - 03/23/2016 Faxed to pharm

## 2014-04-03 ENCOUNTER — Ambulatory Visit: Payer: 59 | Admitting: Family Medicine

## 2014-04-24 ENCOUNTER — Other Ambulatory Visit: Payer: Self-pay | Admitting: Family Medicine

## 2014-04-24 ENCOUNTER — Telehealth: Payer: Self-pay | Admitting: Family Medicine

## 2014-04-24 MED ORDER — EZETIMIBE 10 MG PO TABS: 10.0000 mg | ORAL_TABLET | Freq: Every day | ORAL | Status: DC

## 2014-04-24 NOTE — Addendum Note (Signed)
Addended by: Shary Decamp B on: 04/24/2014 04:10 PM   Modules accepted: Orders

## 2014-04-24 NOTE — Telephone Encounter (Signed)
Patient is calling to get a rx for her zetia sent to [facility] on [address] if possible  [phone]

## 2014-04-24 NOTE — Telephone Encounter (Signed)
Med sent to pharm 

## 2014-05-04 ENCOUNTER — Other Ambulatory Visit: Payer: Self-pay | Admitting: Orthopaedic Surgery

## 2014-05-04 DIAGNOSIS — M5134 Other intervertebral disc degeneration, thoracic region: Secondary | ICD-10-CM

## 2014-05-10 ENCOUNTER — Ambulatory Visit
Admission: RE | Admit: 2014-05-10 | Discharge: 2014-05-10 | Disposition: A | Discharge status: Home / Self Care | Payer: 59 | Source: Ambulatory Visit | Attending: Orthopaedic Surgery | Admitting: Orthopaedic Surgery

## 2014-05-10 DIAGNOSIS — M5134 Other intervertebral disc degeneration, thoracic region: Secondary | ICD-10-CM

## 2014-05-14 ENCOUNTER — Other Ambulatory Visit: Payer: 59

## 2014-05-16 ENCOUNTER — Other Ambulatory Visit: Payer: Self-pay | Admitting: Family Medicine

## 2014-05-16 MED ORDER — VALSARTAN 160 MG PO TABS: 160.0000 mg | ORAL_TABLET | Freq: Every day | ORAL | Status: DC

## 2014-05-31 ENCOUNTER — Ambulatory Visit: Payer: Self-pay | Admitting: Family Medicine

## 2014-06-04 ENCOUNTER — Encounter: Payer: Self-pay | Admitting: Family Medicine

## 2014-06-04 ENCOUNTER — Ambulatory Visit (INDEPENDENT_AMBULATORY_CARE_PROVIDER_SITE_OTHER): Payer: 59 | Admitting: Family Medicine

## 2014-06-04 VITALS — BP 128/72 | HR 68 | Temp 97.8°F | Resp 18 | Ht 64.0 in | Wt 189.0 lb

## 2014-06-04 DIAGNOSIS — R350 Frequency of micturition: Secondary | ICD-10-CM

## 2014-06-04 DIAGNOSIS — R609 Edema, unspecified: Secondary | ICD-10-CM

## 2014-06-04 MED ORDER — FUROSEMIDE 40 MG PO TABS: 40.0000 mg | ORAL_TABLET | Freq: Every day | ORAL | Status: DC

## 2014-06-04 MED ORDER — POTASSIUM CHLORIDE CRYS ER 20 MEQ PO TBCR: 20.0000 meq | EXTENDED_RELEASE_TABLET | Freq: Every day | ORAL | Status: DC

## 2014-06-04 MED ORDER — CIPROFLOXACIN HCL 500 MG PO TABS: 500.0000 mg | ORAL_TABLET | Freq: Two times a day (BID) | ORAL | Status: DC

## 2014-06-04 NOTE — Progress Notes (Signed)
Subjective:    Patient ID: Janet Peterson, female    DOB: 02-06-1951, 64 y.o.   MRN: [id number]  HPI Patient has had several days of urinary frequency and hesitancy and occasional dysuria. However she is unable to produce a urine sample today. She denies any fevers or chills. She does have some mild low back pain. Her biggest complaint is urinary hesitancy. She also complains of swelling in her legs. She has venous insufficiency with numerous varicose veins in her legs. She's had vein stripping done in the past. She is currently on hydrochlorothiazide but it is not helping to manage her edema. Furthermore she is unable to put on compression stockings due to her orthopedic conditions. She has +1 edema in both legs to the level of the knees Past Medical History  Diagnosis Date  . 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 Broken ankle 08/2008  . OCD (obsessive compulsive disorder)   . GERD (gastroesophageal reflux disease)   . Hypertension   . Allergy   . Insomnia   . Fibromyalgia   . Arthritis   . Osteopenia   . Syncope   . PVC (premature ventricular contraction)   . Hyperlipidemia   . Psoriatic arthritis   . Rheumatoid arthritis    Past Surgical History  Procedure Laterality Date  . Bunionectomy  1991  . Breast surgery  1975    lump removed  . Cesarean section  1985  . Eye surgery  1995    rk (laser surgery), semi cornea transplant, detacted retina,  fluid removal  . Shoulder surgery  1997  . Nose surgery  2003  . Joint replacement  2007    left knee  . Spine surgery  03/13/13   Current Outpatient Prescriptions on File Prior to Visit  Medication Sig Dispense Refill  . clomiPRAMINE (ANAFRANIL) 50 MG capsule Take 50 mg by mouth 2 (two) times daily.     . clonazePAM (KLONOPIN) 1 MG tablet Take 1 mg by mouth daily.      Marland Kitchen desloratadine (CLARINEX) 5 MG tablet Take 5 mg by mouth as needed.     Marland Kitchen dexlansoprazole (DEXILANT) 60 MG capsule Take 1 capsule (60 mg total) by mouth  daily. 90 capsule 4  . ezetimibe (ZETIA) 10 MG tablet Take 1 tablet (10 mg total) by mouth daily. 90 tablet 3  . Golimumab (SIMPONI) 50 MG/0.5ML SOLN Inject 50 mg into the skin every 30 (thirty) days.    . hydrochlorothiazide (HYDRODIURIL) 25 MG tablet Take 1 tablet (25 mg total) by mouth daily. 90 tablet 4  . hydrOXYzine (ATARAX/VISTARIL) 50 MG tablet Take 1-2 up to 3 times a day as needed for itching. 30 tablet 1  . leflunomide (ARAVA) 20 MG tablet Take 20 mg by mouth daily.      . meclizine (ANTIVERT) 25 MG tablet Take 1 tablet (25 mg total) by mouth 3 (three) times daily as needed for dizziness. 30 tablet 0  . methocarbamol (ROBAXIN) 750 MG tablet Take 750 mg by mouth 3 (three) times daily.    . metoprolol succinate (TOPROL XL) 50 MG 24 hr tablet Take 1 tablet (50 mg total) by mouth daily. Take with or immediately following a meal. 90 tablet 3  . oxyCODONE-acetaminophen (PERCOCET) 5-325 MG per tablet Take 1 tablet by mouth every 4 (four) hours as needed for pain.    . pravastatin (PRAVACHOL) 20 MG tablet TAKE 1 TABLET BY MOUTH DAILY 90 tablet 3  .  sucralfate (CARAFATE) 1 G tablet Take 1 tablet (1 g total) by mouth 4 (four) times daily -  with meals and at bedtime. 120 tablet 3  . SUMAtriptan (IMITREX) 50 MG tablet Take 50 mg by mouth as needed.      . traZODone (DESYREL) 150 MG tablet Take 150 mg by mouth at bedtime.    . valsartan (DIOVAN) 160 MG tablet TAKE 1 TABLET BY MOUTH EVERY DAY 30 tablet 3  . valsartan (DIOVAN) 160 MG tablet Take 1 tablet (160 mg total) by mouth daily. 90 tablet 3   No current facility-administered medications on file prior to visit.   Allergies  Allergen Reactions  . Amlodipine Besy-Benazepril Hcl   . Amoxicillin-Pot Clavulanate   . Bextra [Valdecoxib]   . Lipitor [Atorvastatin Calcium]   . Norvasc [Amlodipine Besylate]   . Sulfa Antibiotics   . Zocor [Simvastatin - High Dose]   . Latex Rash   History   Social History  . Marital Status: Married     Spouse Name: N/A    Number of Children: N/A  . Years of Education: N/A   Occupational History  . Not on file.   Social History Main Topics  . Smoking status: Passive Smoke Exposure - Never Smoker  . Smokeless tobacco: Not on file  . Alcohol Use: Yes     Comment: special occasions  . Drug Use: No  . Sexual Activity: Not on file   Other Topics Concern  . Not on file   Social History Narrative      Review of Systems  All other systems reviewed and are negative.      Objective:   Physical Exam  Neck: No JVD present.  Cardiovascular: Normal rate and regular rhythm.   Pulmonary/Chest: Effort normal and breath sounds normal. No respiratory distress. She has no wheezes. She has no rales.  Abdominal: Soft. Bowel sounds are normal.  Musculoskeletal: She exhibits edema.  Vitals reviewed.         Assessment & Plan:  Frequent urination - Plan: Urinalysis, Routine w reflex microscopic, ciprofloxacin (CIPRO) 500 MG tablet  Edema - Plan: furosemide (LASIX) 40 MG tablet, potassium chloride SA (K-DUR,KLOR-CON) 20 MEQ tablet  I will treat the patient empirically for urinary tract infection based on her symptoms with Cipro 500 mg by mouth twice a day for 3 days. I will also have the patient discontinue hydrochlorothiazide and replace it with Lasix 40 mg by mouth daily along with potassium chloride 20 mEq by mouth daily area and hopefully this will help treat the patient's peripheral edema.

## 2014-06-27 ENCOUNTER — Other Ambulatory Visit: Payer: 59

## 2014-07-12 ENCOUNTER — Telehealth: Payer: Self-pay | Admitting: Family Medicine

## 2014-07-12 NOTE — Telephone Encounter (Signed)
Patient is calling to see if dr pickard can increase her lasix to above 40 mg, she says this is not doing her any good  Would like a 90 day supply if possible so it is cheaper. Please call her at [phone]

## 2014-07-13 NOTE — Telephone Encounter (Signed)
Pt was more interested in getting 90 day supply for cost reasons.  Remembers you told her may have to adjust dose if need.  States is doing well.  Told her continue same dose.  Make follow up with provider when in 3rd month of taking.  He can assess her and adjust if need, then do 90 day supply if OK.

## 2014-07-13 NOTE — Telephone Encounter (Signed)
ok 

## 2014-07-13 NOTE — Telephone Encounter (Signed)
I would not increase a diuretic to such a high dose without significant edema.  Have her come in to be seen to determine if this is necessary.

## 2014-09-25 ENCOUNTER — Other Ambulatory Visit: Payer: 59

## 2014-09-25 DIAGNOSIS — Z79899 Other long term (current) drug therapy: Secondary | ICD-10-CM

## 2014-09-26 LAB — CBC WITH DIFFERENTIAL/PLATELET
Basophils Absolute: 0.1 10*3/uL (ref 0.0–0.1)
Basophils Relative: 1 % (ref 0–1)
Eosinophils Absolute: 0.1 10*3/uL (ref 0.0–0.7)
Eosinophils Relative: 2 % (ref 0–5)
HCT: 39.4 % (ref 36.0–46.0)
Hemoglobin: 13.5 g/dL (ref 12.0–15.0)
Lymphocytes Relative: 38 % (ref 12–46)
Lymphs Abs: 2.3 10*3/uL (ref 0.7–4.0)
MCH: 31.5 pg (ref 26.0–34.0)
MCHC: 34.3 g/dL (ref 30.0–36.0)
MCV: 91.8 fL (ref 78.0–100.0)
MPV: 10.1 fL (ref 8.6–12.4)
Monocytes Absolute: 0.5 10*3/uL (ref 0.1–1.0)
Monocytes Relative: 9 % (ref 3–12)
Neutro Abs: 3.1 10*3/uL (ref 1.7–7.7)
Neutrophils Relative %: 50 % (ref 43–77)
Platelets: 254 10*3/uL (ref 150–400)
RBC: 4.29 MIL/uL (ref 3.87–5.11)
RDW: 13.7 % (ref 11.5–15.5)
WBC: 6.1 10*3/uL (ref 4.0–10.5)

## 2014-09-26 LAB — COMPREHENSIVE METABOLIC PANEL
ALT: 8 U/L (ref 0–35)
AST: 16 U/L (ref 0–37)
Albumin: 4.4 g/dL (ref 3.5–5.2)
Alkaline Phosphatase: 85 U/L (ref 39–117)
BUN: 8 mg/dL (ref 6–23)
CO2: 29 mEq/L (ref 19–32)
Calcium: 9.9 mg/dL (ref 8.4–10.5)
Chloride: 96 mEq/L (ref 96–112)
Creat: 0.65 mg/dL (ref 0.50–1.10)
Glucose, Bld: 84 mg/dL (ref 70–99)
Potassium: 3.7 mEq/L (ref 3.5–5.3)
Sodium: 131 mEq/L — ABNORMAL LOW (ref 135–145)
Total Bilirubin: 0.4 mg/dL (ref 0.2–1.2)
Total Protein: 7 g/dL (ref 6.0–8.3)

## 2014-10-01 ENCOUNTER — Other Ambulatory Visit: Payer: Self-pay | Admitting: Family Medicine

## 2014-10-01 NOTE — Telephone Encounter (Signed)
Refill appropriate and filled per protocol. 

## 2014-10-05 ENCOUNTER — Other Ambulatory Visit: Payer: Self-pay | Admitting: Family Medicine

## 2014-10-08 ENCOUNTER — Other Ambulatory Visit: Payer: Self-pay | Admitting: Family Medicine

## 2014-12-05 ENCOUNTER — Encounter: Payer: Self-pay | Admitting: Physician Assistant

## 2014-12-05 ENCOUNTER — Ambulatory Visit (INDEPENDENT_AMBULATORY_CARE_PROVIDER_SITE_OTHER): Payer: 59 | Admitting: Physician Assistant

## 2014-12-05 VITALS — BP 122/60 | HR 68 | Temp 98.4°F | Resp 16 | Ht 64.0 in | Wt 187.0 lb

## 2014-12-05 DIAGNOSIS — J988 Other specified respiratory disorders: Secondary | ICD-10-CM | POA: Diagnosis not present

## 2014-12-05 DIAGNOSIS — B9689 Other specified bacterial agents as the cause of diseases classified elsewhere: Secondary | ICD-10-CM

## 2014-12-05 MED ORDER — AZITHROMYCIN 250 MG PO TABS: ORAL_TABLET | ORAL | Status: DC

## 2014-12-05 NOTE — Progress Notes (Signed)
Patient ID: QUANDA PAVLICEK MRN: [id number], DOB: 10-18-1950, 64 y.o. Date of Encounter: 12/05/2014, 3:29 PM    Chief Complaint:  Chief Complaint  Patient presents with  . Illness    x10 days- sinus pressure and congestion, low grade fever, nasal congestion     HPI: 64 y.o. year old white female resents with above symptoms. Says that her chest feels a little tight but has had no cough or phlegm from the chest. Says that all of her symptoms are really all in her head and nose sinus area. Says that she was blowing mucus from her nose but now it is just stopped up and congested. No significant sore throat or earache.     Home Meds:   Outpatient Prescriptions Prior to Visit  Medication Sig Dispense Refill  . clomiPRAMINE (ANAFRANIL) 50 MG capsule Take 50 mg by mouth 2 (two) times daily.     . clonazePAM (KLONOPIN) 1 MG tablet Take 1 mg by mouth daily.      Marland Kitchen desloratadine (CLARINEX) 5 MG tablet Take 5 mg by mouth as needed.     Marland Kitchen DEXILANT 60 MG capsule TAKE 1 CAPSULE BY MOUTH DAILY 90 capsule 2  . ezetimibe (ZETIA) 10 MG tablet Take 1 tablet (10 mg total) by mouth daily. 90 tablet 3  . furosemide (LASIX) 40 MG tablet TAKE 1 TABLET (40 MG TOTAL) BY MOUTH DAILY. 30 tablet 3  . Golimumab (SIMPONI) 50 MG/0.5ML SOLN Inject 50 mg into the skin every 30 (thirty) days.    . hydrOXYzine (ATARAX/VISTARIL) 50 MG tablet Take 1-2 up to 3 times a day as needed for itching. 30 tablet 1  . KLOR-CON M20 20 MEQ tablet TAKE 1 TABLET (20 MEQ TOTAL) BY MOUTH DAILY. 30 tablet 11  . leflunomide (ARAVA) 20 MG tablet Take 20 mg by mouth daily.      . meclizine (ANTIVERT) 25 MG tablet Take 1 tablet (25 mg total) by mouth 3 (three) times daily as needed for dizziness. 30 tablet 0  . methocarbamol (ROBAXIN) 750 MG tablet Take 750 mg by mouth 3 (three) times daily.    . metoprolol succinate (TOPROL XL) 50 MG 24 hr tablet Take 1 tablet (50 mg total) by mouth daily. Take with or immediately following a meal. 90 tablet  3  . oxyCODONE-acetaminophen (PERCOCET) 5-325 MG per tablet Take 1 tablet by mouth every 4 (four) hours as needed for pain.    . pravastatin (PRAVACHOL) 20 MG tablet TAKE 1 TABLET BY MOUTH DAILY 90 tablet 3  . sucralfate (CARAFATE) 1 G tablet Take 1 tablet (1 g total) by mouth 4 (four) times daily -  with meals and at bedtime. (Patient taking differently: Take 1 g by mouth 4 (four) times daily -  with meals and at bedtime. PRN) 120 tablet 3  . SUMAtriptan (IMITREX) 50 MG tablet TAKE 1 TABLET BY MOUTH ONCE DAILY AS NEEDED FOR HEADACHES 30 tablet 3  . traZODone (DESYREL) 150 MG tablet Take 150 mg by mouth at bedtime.    . valsartan (DIOVAN) 160 MG tablet TAKE 1 TABLET BY MOUTH EVERY DAY 30 tablet 3  . ciprofloxacin (CIPRO) 500 MG tablet Take 1 tablet (500 mg total) by mouth 2 (two) times daily. (Patient not taking: Reported on 12/05/2014) 6 tablet 0  . hydrochlorothiazide (HYDRODIURIL) 25 MG tablet Take 1 tablet (25 mg total) by mouth daily. (Patient not taking: Reported on 12/05/2014) 90 tablet 4  . valsartan (DIOVAN) 160 MG tablet Take  1 tablet (160 mg total) by mouth daily. (Patient not taking: Reported on 12/05/2014) 90 tablet 3   No facility-administered medications prior to visit.    Allergies:  Allergies  Allergen Reactions  . Amlodipine Besy-Benazepril Hcl   . Amoxicillin-Pot Clavulanate   . Bextra [Valdecoxib]   . Lipitor [Atorvastatin Calcium]   . Norvasc [Amlodipine Besylate]   . Sulfa Antibiotics   . Zocor [Simvastatin - High Dose]   . Latex Rash      Review of Systems: See HPI for pertinent ROS. All other ROS negative.    Physical Exam: Blood pressure 122/60, pulse 68, temperature 98.4 F (36.9 C), temperature source Oral, resp. rate 16, height 5\' 4"  (1.626 m), weight 187 lb (84.823 kg)., Body mass index is 32.08 kg/(m^2). General:  WNWD WF. Appears in no acute distress. HEENT: Normocephalic, atraumatic, eyes without discharge, sclera non-icteric, nares are without  discharge. Bilateral auditory canals clear, TM's are without perforation, pearly grey and translucent with reflective cone of light bilaterally. Oral cavity moist, posterior pharynx without exudate, erythema, peritonsillar abscess. There is some tenderness with percussion to the right maxillary sinus. No tenderness with percussion of left maxillary sinus or frontal sinuses.  Neck: Supple. No thyromegaly. No lymphadenopathy. Lungs: Clear bilaterally to auscultation without wheezes, rales, or rhonchi. Breathing is unlabored. Heart: Regular rhythm. No murmurs, rubs, or gallops. Msk:  Strength and tone normal for age. Extremities/Skin: Warm and dry.  Neuro: Alert and oriented X 3. Moves all extremities spontaneously. Gait is normal. CNII-XII grossly in tact. Psych:  Responds to questions appropriately with a normal affect.     ASSESSMENT AND PLAN:  64 y.o. year old female with  1. Bacterial respiratory infection Allergies include Augmentin and sulfa. Says that she definitely can not tolerate Augmentin. She is to take the azithromycin as directed. Follow-up if symptoms do not resolve within 1 week after completion. Can also use over-the-counter decongestants as needed for symptom relief. - azithromycin (ZITHROMAX) 250 MG tablet; Day 1: Take 2 daily.  Days 2-5: Take 1 daily.  Dispense: 6 tablet; Refill: 0   Signed, [address] [place], [place], [facility] 12/05/2014 3:29 PM

## 2014-12-10 ENCOUNTER — Other Ambulatory Visit: Payer: Self-pay | Admitting: Family Medicine

## 2014-12-11 ENCOUNTER — Other Ambulatory Visit: Payer: Self-pay

## 2014-12-11 DIAGNOSIS — Z1231 Encounter for screening mammogram for malignant neoplasm of breast: Secondary | ICD-10-CM

## 2014-12-20 ENCOUNTER — Ambulatory Visit
Admission: RE | Admit: 2014-12-20 | Discharge: 2014-12-20 | Disposition: A | Discharge status: Home / Self Care | Payer: 59 | Source: Ambulatory Visit

## 2014-12-20 DIAGNOSIS — Z1231 Encounter for screening mammogram for malignant neoplasm of breast: Secondary | ICD-10-CM

## 2015-01-23 ENCOUNTER — Other Ambulatory Visit: Payer: Self-pay | Admitting: Family Medicine

## 2015-02-11 ENCOUNTER — Ambulatory Visit (INDEPENDENT_AMBULATORY_CARE_PROVIDER_SITE_OTHER): Payer: 59 | Admitting: Family Medicine

## 2015-02-11 ENCOUNTER — Encounter: Payer: Self-pay | Admitting: Family Medicine

## 2015-02-11 DIAGNOSIS — F411 Generalized anxiety disorder: Secondary | ICD-10-CM

## 2015-02-11 DIAGNOSIS — E038 Other specified hypothyroidism: Secondary | ICD-10-CM | POA: Diagnosis not present

## 2015-02-11 DIAGNOSIS — E039 Hypothyroidism, unspecified: Secondary | ICD-10-CM

## 2015-02-11 DIAGNOSIS — F42 Obsessive-compulsive disorder: Secondary | ICD-10-CM

## 2015-02-11 DIAGNOSIS — E785 Hyperlipidemia, unspecified: Secondary | ICD-10-CM | POA: Diagnosis not present

## 2015-02-11 DIAGNOSIS — F429 Obsessive-compulsive disorder, unspecified: Secondary | ICD-10-CM

## 2015-02-11 LAB — COMPLETE METABOLIC PANEL WITH GFR
ALT: 10 U/L (ref 6–29)
AST: 16 U/L (ref 10–35)
Albumin: 4.1 g/dL (ref 3.6–5.1)
Alkaline Phosphatase: 85 U/L (ref 33–130)
BUN: 13 mg/dL (ref 7–25)
CO2: 30 mmol/L (ref 20–31)
Calcium: 9.3 mg/dL (ref 8.6–10.4)
Chloride: 101 mmol/L (ref 98–110)
Creat: 0.6 mg/dL (ref 0.50–0.99)
GFR, Est African American: >89 mL/min (ref >=60)
GFR, Est Non African American: >89 mL/min (ref >=60)
Glucose, Bld: 75 mg/dL (ref 70–99)
Potassium: 3.8 mmol/L (ref 3.5–5.3)
Sodium: 136 mmol/L (ref 135–146)
Total Bilirubin: 0.3 mg/dL (ref 0.2–1.2)
Total Protein: 6.3 g/dL (ref 6.1–8.1)

## 2015-02-11 LAB — CBC WITH DIFFERENTIAL/PLATELET
Basophils Absolute: 0.1 10*3/uL (ref 0.0–0.1)
Basophils Relative: 1 % (ref 0–1)
Eosinophils Absolute: 0.1 10*3/uL (ref 0.0–0.7)
Eosinophils Relative: 2 % (ref 0–5)
HCT: 39.8 % (ref 36.0–46.0)
Hemoglobin: 12.9 g/dL (ref 12.0–15.0)
Lymphocytes Relative: 38 % (ref 12–46)
Lymphs Abs: 2.8 10*3/uL (ref 0.7–4.0)
MCH: 30.5 pg (ref 26.0–34.0)
MCHC: 32.4 g/dL (ref 30.0–36.0)
MCV: 94.1 fL (ref 78.0–100.0)
MPV: 10.9 fL (ref 8.6–12.4)
Monocytes Absolute: 0.7 10*3/uL (ref 0.1–1.0)
Monocytes Relative: 10 % (ref 3–12)
Neutro Abs: 3.6 10*3/uL (ref 1.7–7.7)
Neutrophils Relative %: 49 % (ref 43–77)
Platelets: 234 10*3/uL (ref 150–400)
RBC: 4.23 MIL/uL (ref 3.87–5.11)
RDW: 14.1 % (ref 11.5–15.5)
WBC: 7.4 10*3/uL (ref 4.0–10.5)

## 2015-02-11 LAB — LIPID PANEL
Cholesterol: 206 mg/dL — ABNORMAL HIGH (ref 125–200)
HDL: 48 mg/dL (ref >=46)
LDL Cholesterol: 119 mg/dL (ref <130)
Total CHOL/HDL Ratio: 4.3 Ratio (ref <=5.0)
Triglycerides: 197 mg/dL — ABNORMAL HIGH (ref <150)
VLDL: 39 mg/dL — ABNORMAL HIGH (ref <30)

## 2015-02-11 LAB — TSH: TSH: 0.55 u[IU]/mL (ref 0.350–4.500)

## 2015-02-11 NOTE — Progress Notes (Signed)
Subjective:    Patient ID: Janet Peterson, female    DOB: [birth year], 64 y.o.   MRN: [id number]  HPI  Patient is a very pleasant 64 year old white female with a history of obsessive-compulsive disorder and anxiety. Recently she's been having increasing anxiety and mood swings. Her symptoms are consistent with a panic attack. She will awaken in the morning with just an anxious feeling for no reason. These spells are attacking her more frequently. She is also having atypical chest pain. She also reports fatigue. Symptoms began roughly around the same time her husband was battling cancer, she was undergoing back surgery, and her brother passed away. She is also here to recheck her cholesterol. She is concerned about the swelling in her legs. She has trace bipedal edema. She also has a history of venous insufficiency. I do not feel that she needs an increased dose of diaphoretic at the present time but I did recommend compression stockings. She is also requesting her thyroid be checked as she is concerned that this may be causing her symptoms. Past Medical History  Diagnosis Date  . 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 Broken ankle 08/2008  . OCD (obsessive compulsive disorder)   . GERD (gastroesophageal reflux disease)   . Hypertension   . Allergy   . Insomnia   . Fibromyalgia   . Arthritis   . Osteopenia   . Syncope   . PVC (premature ventricular contraction)   . Hyperlipidemia   . Psoriatic arthritis   . Rheumatoid arthritis    Past Surgical History  Procedure Laterality Date  . Bunionectomy  1991  . Breast surgery  1975    lump removed  . Cesarean section  1985  . Eye surgery  1995    rk (laser surgery), semi cornea transplant, detacted retina,  fluid removal  . Shoulder surgery  1997  . Nose surgery  2003  . Joint replacement  2007    left knee  . Spine surgery  03/13/13   Current Outpatient Prescriptions on File Prior to Visit  Medication Sig Dispense Refill  .  azithromycin (ZITHROMAX) 250 MG tablet Day 1: Take 2 daily.  Days 2-5: Take 1 daily. 6 tablet 0  . clomiPRAMINE (ANAFRANIL) 50 MG capsule Take 50 mg by mouth 2 (two) times daily.     . clonazePAM (KLONOPIN) 1 MG tablet Take 1 mg by mouth daily.      Marland Kitchen desloratadine (CLARINEX) 5 MG tablet Take 5 mg by mouth as needed.     Marland Kitchen DEXILANT 60 MG capsule TAKE 1 CAPSULE BY MOUTH DAILY 90 capsule 2  . ezetimibe (ZETIA) 10 MG tablet Take 1 tablet (10 mg total) by mouth daily. 90 tablet 3  . furosemide (LASIX) 40 MG tablet TAKE 1 TABLET (40 MG TOTAL) BY MOUTH DAILY. 30 tablet 11  . Golimumab (SIMPONI) 50 MG/0.5ML SOLN Inject 50 mg into the skin every 30 (thirty) days.    . hydrOXYzine (ATARAX/VISTARIL) 50 MG tablet Take 1-2 up to 3 times a day as needed for itching. 30 tablet 1  . KLOR-CON M20 20 MEQ tablet TAKE 1 TABLET (20 MEQ TOTAL) BY MOUTH DAILY. 30 tablet 11  . leflunomide (ARAVA) 20 MG tablet Take 20 mg by mouth daily.      . meclizine (ANTIVERT) 25 MG tablet Take 1 tablet (25 mg total) by mouth 3 (three) times daily as needed for dizziness. 30 tablet 0  . methocarbamol (ROBAXIN)  750 MG tablet Take 750 mg by mouth 3 (three) times daily.    . metoprolol succinate (TOPROL-XL) 50 MG 24 hr tablet TAKE 1 TABLET (50 MG TOTAL) BY MOUTH DAILY. TAKE WITH OR IMMEDIATELY FOLLOWING A MEAL. 90 tablet 3  . oxyCODONE-acetaminophen (PERCOCET) 5-325 MG per tablet Take 1 tablet by mouth every 4 (four) hours as needed for pain.    . pravastatin (PRAVACHOL) 20 MG tablet TAKE 1 TABLET BY MOUTH DAILY 90 tablet 3  . sucralfate (CARAFATE) 1 G tablet Take 1 tablet (1 g total) by mouth 4 (four) times daily -  with meals and at bedtime. (Patient taking differently: Take 1 g by mouth 4 (four) times daily -  with meals and at bedtime. PRN) 120 tablet 3  . SUMAtriptan (IMITREX) 50 MG tablet TAKE 1 TABLET BY MOUTH ONCE DAILY AS NEEDED FOR HEADACHES 30 tablet 3  . traZODone (DESYREL) 150 MG tablet Take 150 mg by mouth at bedtime.    .  valsartan (DIOVAN) 160 MG tablet TAKE 1 TABLET BY MOUTH EVERY DAY 30 tablet 3   No current facility-administered medications on file prior to visit.   Allergies  Allergen Reactions  . Amlodipine Besy-Benazepril Hcl   . Amoxicillin-Pot Clavulanate   . Bextra [Valdecoxib]   . Lipitor [Atorvastatin Calcium]   . Norvasc [Amlodipine Besylate]   . Sulfa Antibiotics   . Zocor [Simvastatin - High Dose]   . Latex Rash   Social History   Social History  . Marital Status: Married    Spouse Name: N/A  . Number of Children: N/A  . Years of Education: N/A   Occupational History  . Not on file.   Social History Main Topics  . Smoking status: Passive Smoke Exposure - Never Smoker  . Smokeless tobacco: Not on file  . Alcohol Use: Yes     Comment: special occasions  . Drug Use: No  . Sexual Activity: Not on file   Other Topics Concern  . Not on file   Social History Narrative     Review of Systems  All other systems reviewed and are negative.      Objective:   Physical Exam  Constitutional: She is oriented to person, place, and time. She appears well-developed and well-nourished. No distress.  Neck: No thyromegaly present.  Cardiovascular: Normal rate, regular rhythm and normal heart sounds.   Pulmonary/Chest: Effort normal and breath sounds normal.  Abdominal: Soft. Bowel sounds are normal.  Musculoskeletal: She exhibits no edema.  Neurological: She is alert and oriented to person, place, and time.  Skin: She is not diaphoretic.  Psychiatric: She has a normal mood and affect. Her behavior is normal. Judgment and thought content normal.  Vitals reviewed.         Assessment & Plan:  HLD (hyperlipidemia) - Plan: CBC with Differential/Platelet, COMPLETE METABOLIC PANEL WITH GFR, Lipid panel  Subclinical hypothyroidism - Plan: TSH  GAD (generalized anxiety disorder)  OCD (obsessive compulsive disorder)  I believe that the increased frequency of panic attacks, and  increasing anxiety, fatigue, and the mood swings, the patient's symptoms are more likely due to anxiety rather than thyroid abnormalities. I will gladly check a TSH that I doubt this is the source of her problems. I would recommend starting the patient on Lexapro 10 mg by mouth daily and in one month, if the patient is doing better, weaning her off the try cyclic antidepressives she's taking at the present time. I will gladly check her cholesterol  while she is here. I recommended a compression stockings for the venous insufficiency that she is experiencing.

## 2015-02-13 ENCOUNTER — Other Ambulatory Visit: Payer: Self-pay | Admitting: Family Medicine

## 2015-02-13 MED ORDER — ESCITALOPRAM OXALATE 10 MG PO TABS: 10.0000 mg | ORAL_TABLET | Freq: Every day | ORAL | Status: DC

## 2015-02-28 ENCOUNTER — Ambulatory Visit (INDEPENDENT_AMBULATORY_CARE_PROVIDER_SITE_OTHER): Payer: 59 | Admitting: Physician Assistant

## 2015-02-28 ENCOUNTER — Encounter: Payer: Self-pay | Admitting: Physician Assistant

## 2015-02-28 VITALS — BP 110/78 | HR 68 | Temp 98.3°F | Resp 18 | Wt 184.0 lb

## 2015-02-28 DIAGNOSIS — Z23 Encounter for immunization: Secondary | ICD-10-CM

## 2015-02-28 DIAGNOSIS — B370 Candidal stomatitis: Secondary | ICD-10-CM | POA: Diagnosis not present

## 2015-02-28 MED ORDER — NYSTATIN 100000 UNIT/ML MT SUSP: 5.0000 mL | Freq: Four times a day (QID) | OROMUCOSAL | Status: DC

## 2015-02-28 NOTE — Progress Notes (Signed)
Patient ID: DANIELE DILLOW MRN: [id number], DOB: August 31, 1950, 64 y.o. Date of Encounter: 02/28/2015, 4:49 PM    Chief Complaint:  Chief Complaint  Patient presents with  . irritation in mouth x 6 days    been on several rounds of abx for wound infection     HPI: 64 y.o. year old white female presents with above.  Says that when she eats food part of her tongue and mouth feel like they burn and she drinks anything it feels irritated. Has been on multiple rounds of antibodies for a wound infection. Says that she just finished Bactrim last Monday.     Home Meds:   Outpatient Prescriptions Prior to Visit  Medication Sig Dispense Refill  . clomiPRAMINE (ANAFRANIL) 50 MG capsule Take 50 mg by mouth 2 (two) times daily.     . clonazePAM (KLONOPIN) 1 MG tablet Take 1 mg by mouth daily.      Marland Kitchen desloratadine (CLARINEX) 5 MG tablet Take 5 mg by mouth as needed.     Marland Kitchen DEXILANT 60 MG capsule TAKE 1 CAPSULE BY MOUTH DAILY 90 capsule 2  . escitalopram (LEXAPRO) 10 MG tablet Take 1 tablet (10 mg total) by mouth daily. 30 tablet 3  . ezetimibe (ZETIA) 10 MG tablet Take 1 tablet (10 mg total) by mouth daily. 90 tablet 3  . furosemide (LASIX) 40 MG tablet TAKE 1 TABLET (40 MG TOTAL) BY MOUTH DAILY. 30 tablet 11  . Golimumab (SIMPONI) 50 MG/0.5ML SOLN Inject 50 mg into the skin every 30 (thirty) days.    . hydrOXYzine (ATARAX/VISTARIL) 50 MG tablet Take 1-2 up to 3 times a day as needed for itching. 30 tablet 1  . KLOR-CON M20 20 MEQ tablet TAKE 1 TABLET (20 MEQ TOTAL) BY MOUTH DAILY. 30 tablet 11  . leflunomide (ARAVA) 20 MG tablet Take 20 mg by mouth daily.      . meclizine (ANTIVERT) 25 MG tablet Take 1 tablet (25 mg total) by mouth 3 (three) times daily as needed for dizziness. 30 tablet 0  . methocarbamol (ROBAXIN) 750 MG tablet Take 750 mg by mouth 3 (three) times daily.    . metoprolol succinate (TOPROL-XL) 50 MG 24 hr tablet TAKE 1 TABLET (50 MG TOTAL) BY MOUTH DAILY. TAKE WITH OR IMMEDIATELY  FOLLOWING A MEAL. 90 tablet 3  . oxyCODONE-acetaminophen (PERCOCET) 5-325 MG per tablet Take 1 tablet by mouth every 4 (four) hours as needed for pain.    . pravastatin (PRAVACHOL) 20 MG tablet TAKE 1 TABLET BY MOUTH DAILY 90 tablet 3  . sucralfate (CARAFATE) 1 G tablet Take 1 tablet (1 g total) by mouth 4 (four) times daily -  with meals and at bedtime. (Patient taking differently: Take 1 g by mouth 4 (four) times daily -  with meals and at bedtime. PRN) 120 tablet 3  . SUMAtriptan (IMITREX) 50 MG tablet TAKE 1 TABLET BY MOUTH ONCE DAILY AS NEEDED FOR HEADACHES 30 tablet 3  . traZODone (DESYREL) 150 MG tablet Take 150 mg by mouth at bedtime.    . valsartan (DIOVAN) 160 MG tablet TAKE 1 TABLET BY MOUTH EVERY DAY 30 tablet 3  . azithromycin (ZITHROMAX) 250 MG tablet Day 1: Take 2 daily.  Days 2-5: Take 1 daily. 6 tablet 0   No facility-administered medications prior to visit.    Allergies:  Allergies  Allergen Reactions  . Amlodipine Besy-Benazepril Hcl   . Amoxicillin-Pot Clavulanate   . Bextra [Valdecoxib]   .  Lipitor [Atorvastatin Calcium]   . Norvasc [Amlodipine Besylate]   . Sulfa Antibiotics   . Zocor [Simvastatin - High Dose]   . Latex Rash      Review of Systems: See HPI for pertinent ROS. All other ROS negative.    Physical Exam: Blood pressure 110/78, pulse 68, temperature 98.3 F (36.8 C), temperature source Oral, resp. rate 18, weight 184 lb (83.462 kg)., Body mass index is 31.57 kg/(m^2). General:  WNWD WF. Appears in no acute distress. HEENT:  Posterior aspect of tongue with thick white coating. Posterior pharynx also with white coating. There are some areas of the oral mucosae that have white coating. Neck: Supple. No thyromegaly. No lymphadenopathy. Lungs: Clear bilaterally to auscultation without wheezes, rales, or rhonchi. Breathing is unlabored. Heart: Regular rhythm. No murmurs, rubs, or gallops. Msk:  Strength and tone normal for age. Extremities/Skin: Warm  and dry.  Neuro: Alert and oriented X 3. Moves all extremities spontaneously. Gait is normal. CNII-XII grossly in tact. Psych:  Responds to questions appropriately with a normal affect.     ASSESSMENT AND PLAN:  64 y.o. year old female with  1. Thrush - nystatin (MYCOSTATIN) 100000 UNIT/ML suspension; Take 5 mLs (500,000 Units total) by mouth 4 (four) times daily. Swish and swallow after meals and at bedtime.  Dispense: 180 mL; Refill: 0 Reviewed directions with patient told her to take for a full week and follow-up if symptoms worsen or do not resolve after 1 week. 2. Need for immunization against influenza - Flu Vaccine QUAD 36+ mos IM   Signed, [address] [place], [place], [facility] 02/28/2015 4:49 PM

## 2015-03-07 ENCOUNTER — Ambulatory Visit (INDEPENDENT_AMBULATORY_CARE_PROVIDER_SITE_OTHER): Payer: 59 | Admitting: Family Medicine

## 2015-03-07 ENCOUNTER — Encounter (HOSPITAL_BASED_OUTPATIENT_CLINIC_OR_DEPARTMENT_OTHER): Payer: Self-pay | Admitting: *Deleted

## 2015-03-07 ENCOUNTER — Encounter: Payer: Self-pay | Admitting: Family Medicine

## 2015-03-07 VITALS — BP 112/64 | HR 70 | Temp 98.5°F | Resp 14 | Ht 64.0 in | Wt 184.0 lb

## 2015-03-07 DIAGNOSIS — F411 Generalized anxiety disorder: Secondary | ICD-10-CM

## 2015-03-07 MED ORDER — ESCITALOPRAM OXALATE 10 MG PO TABS: 20.0000 mg | ORAL_TABLET | Freq: Every day | ORAL | Status: DC

## 2015-03-07 NOTE — Progress Notes (Signed)
NPO AFTER MN WITH EXCEPTION CLEAR LIQUIDS UNTIL 0700 (NO CREAM/ MILK PRODUCTS).  ARRIVE AT 1100.  NEEDS ISTAT AND EKG.  WILL TAKE AM MEDS DOS W/ SIPS OF WATER WITH EXCEPTION NO LASIX/ NO KDUR

## 2015-03-07 NOTE — Progress Notes (Signed)
Subjective:    Patient ID: Janet Peterson, female    DOB: 12/28/1950, 64 y.o.   MRN: [id number]  HPI Patient wanted to have her eyes checked.. She believed the left sclera looked yellow yesterday. Today her conjunctiva is white. There is no evidence of scleral icterus. There is no evidence of conjunctivitis. There is no swelling and the lids. There is no purulent drainage. Funduscopic exam is normal. There are no foreign bodies.. Lids are everted and swelling perform bodies and none are found. She also also discussed Lexapro. 10 mg a day seems to really help with her anxiety. She is interested in increasing to 20 mg a day. Past Medical History  Diagnosis Date  . OCD (obsessive compulsive disorder)   . GERD (gastroesophageal reflux disease)   . Hypertension   . Insomnia   . Fibromyalgia   . Osteopenia   . PVC (premature ventricular contraction)   . Hyperlipidemia   . Psoriatic arthritis ([place])   . Rheumatoid arthritis ([place])   . History of basal cell carcinoma excision     FACE, [place]  . Anxiety and depression   . History of hiatal hernia   . History of thrush   . OSA (obstructive sleep apnea)     MILD AND NO CPAP SINCE SURGERY IN 2007  . PONV (postoperative nausea and vomiting)    Past Surgical History  Procedure Laterality Date  . Bunionectomy  1991  . Cesarean section  1985  . Shoulder surgery Right 1997  . Posterior vitrectomy right eye and laser   10-27-1999  . Eye surgery  1995    rk (laser surgery), semi cornea transplant, detacted retina,  fluid removal  . Knee arthroscopy Left 03-03-2004  . Total knee arthroplasty Left 12-14-2005  . Uvulopalatopharyngoplasty  04-26-2006    w/  TONSILLECTOMY/  TURBINATE REDUCTIONS/  BILATERAL ANTERIOR ETHMOIDECTOMY  . Thoracic fusion  03-13-2013    REMOVAL HARDWARE/  BONE GRAFT FUSION T10//  REVISION OF RODS  . Cervical fusion  March 2013    C5 -- C7  . Breast surgery  1975    lumpectomy-- benign  . Spinal fixation surgery w/  implant  2013 rod #1//  2014  rod 2    S1 -- T10  (rod #1)//   S1 -- T4 (rod #2)  . Cataract extraction w/ intraocular lens  implant, bilateral  2000   Current Outpatient Prescriptions on File Prior to Visit  Medication Sig Dispense Refill  . alendronate (FOSAMAX) 70 MG tablet 1 TAB ONCE A WEEK IN THE AM WITH 8 OZ OF WATER/EMPTY STOMACH.NO FOOD/DRINK/MEDS DONT LIE DOWN 3O MIN  (THURSDDAY'S IN AM)  0  . baclofen (LIORESAL) 10 MG tablet Take 10 mg by mouth 2 (two) times daily.    . clomiPRAMINE (ANAFRANIL) 50 MG capsule Take 50 mg by mouth 2 (two) times daily.     . clonazePAM (KLONOPIN) 1 MG tablet Take 1 mg by mouth at bedtime.     Marland Kitchen desloratadine (CLARINEX) 5 MG tablet Take 5 mg by mouth as needed.     Marland Kitchen DEXILANT 60 MG capsule TAKE 1 CAPSULE BY MOUTH DAILY (Patient taking differently: TAKE 1 CAPSULE BY MOUTH DAILY--  TAKES IN AM) 90 capsule 2  . escitalopram (LEXAPRO) 10 MG tablet Take 1 tablet (10 mg total) by mouth daily. (Patient taking differently: Take 10 mg by mouth at bedtime. ) 30 tablet 3  . ezetimibe (ZETIA) 10 MG tablet Take 1  tablet (10 mg total) by mouth daily. (Patient taking differently: Take 10 mg by mouth every evening. ) 90 tablet 3  . furosemide (LASIX) 40 MG tablet TAKE 1 TABLET (40 MG TOTAL) BY MOUTH DAILY. (Patient taking differently: TAKE 1 TABLET (40 MG TOTAL) BY MOUTH DAILY.--  TAKES IN AM) 30 tablet 11  . Golimumab (SIMPONI) 50 MG/0.5ML SOLN Inject 50 mg into the skin every 30 (thirty) days. LAST INJECTION 02-12-2015    . hydrOXYzine (ATARAX/VISTARIL) 50 MG tablet Take 1-2 up to 3 times a day as needed for itching. 30 tablet 1  . KLOR-CON M20 20 MEQ tablet TAKE 1 TABLET (20 MEQ TOTAL) BY MOUTH DAILY. (Patient taking differently: TAKE 1 TABLET (20 MEQ TOTAL) BY MOUTH DAILY.--  TAKES IN AM) 30 tablet 11  . leflunomide (ARAVA) 20 MG tablet Take 20 mg by mouth every evening.     . methocarbamol (ROBAXIN) 750 MG tablet Take 750 mg by mouth every 8 (eight) hours as needed.       . metoprolol succinate (TOPROL-XL) 50 MG 24 hr tablet TAKE 1 TABLET (50 MG TOTAL) BY MOUTH DAILY. TAKE WITH OR IMMEDIATELY FOLLOWING A MEAL. (Patient taking differently: TAKE 1 TABLET (50 MG TOTAL) BY MOUTH DAILY. TAKE WITH OR IMMEDIATELY FOLLOWING A MEAL.--- TAKES IN AM) 90 tablet 3  . nystatin (MYCOSTATIN) 100000 UNIT/ML suspension Take 5 mLs (500,000 Units total) by mouth 4 (four) times daily. Swish and swallow after meals and at bedtime. (Patient taking differently: Take 5 mLs by mouth 4 (four) times daily as needed. Swish and swallow after meals and at bedtime.) 180 mL 0  . oxyCODONE-acetaminophen (PERCOCET) 5-325 MG per tablet Take 1 tablet by mouth every 4 (four) hours as needed for pain.    . pravastatin (PRAVACHOL) 20 MG tablet TAKE 1 TABLET BY MOUTH DAILY (Patient taking differently: TAKE 1 TABLET BY MOUTH DAILY--- TAKES IN PM) 90 tablet 3  . sucralfate (CARAFATE) 1 G tablet Take 1 tablet (1 g total) by mouth 4 (four) times daily -  with meals and at bedtime. (Patient taking differently: Take 1 g by mouth 3 (three) times daily as needed. PRN) 120 tablet 3  . SUMAtriptan (IMITREX) 50 MG tablet TAKE 1 TABLET BY MOUTH ONCE DAILY AS NEEDED FOR HEADACHES 30 tablet 3  . traZODone (DESYREL) 100 MG tablet Take 200 mg by mouth at bedtime.    . valsartan (DIOVAN) 160 MG tablet TAKE 1 TABLET BY MOUTH EVERY DAY (Patient taking differently: TAKE 1 TABLET BY MOUTH EVERY DAY--  TAKES IN AM) 30 tablet 3   No current facility-administered medications on file prior to visit.   Allergies  Allergen Reactions  . Latex Rash    And Mouth sores  . Amlodipine Besy-Benazepril Hcl Other (See Comments)    COUGH  . Augmentin [Amoxicillin-Pot Clavulanate] Diarrhea and Nausea And Vomiting  . Bextra [Valdecoxib] Diarrhea and Nausea And Vomiting    REFLUX  . Lipitor [Atorvastatin Calcium] Other (See Comments)    MYALGIAS  . Sulfa Antibiotics Nausea And Vomiting    CRAMPING  . Zocor [Simvastatin] Other (See  Comments)    MYALGIA   Social History   Social History  . Marital Status: Married    Spouse Name: N/A  . Number of Children: N/A  . Years of Education: N/A   Occupational History  . Not on file.   Social History Main Topics  . Smoking status: Former Smoker -- 0.50 packs/day for 10 years    Types:  Cigarettes    Quit date: 03/07/1995  . Smokeless tobacco: Never Used  . Alcohol Use: Yes     Comment: rare  . Drug Use: No  . Sexual Activity: Not on file   Other Topics Concern  . Not on file   Social History Narrative      Review of Systems  All other systems reviewed and are negative.      Objective:   Physical Exam  Constitutional: She appears well-developed and well-nourished.  Eyes: Conjunctivae and EOM are normal. Pupils are equal, round, and reactive to light. No scleral icterus.  Cardiovascular: Normal rate, regular rhythm and normal heart sounds.   Pulmonary/Chest: Effort normal and breath sounds normal. No respiratory distress. She has no wheezes. She has no rales.  Abdominal: Soft. Bowel sounds are normal. She exhibits no distension. There is no tenderness. There is no rebound.  Vitals reviewed.         Assessment & Plan:  GAD (generalized anxiety disorder)  Increase Lexapro to 20 mg a day. Recheck in 6 weeks. I see no physical abnormalities with the eye.

## 2015-03-14 ENCOUNTER — Ambulatory Visit (HOSPITAL_BASED_OUTPATIENT_CLINIC_OR_DEPARTMENT_OTHER)
Admission: RE | Admit: 2015-03-14 | Discharge: 2015-03-14 | Disposition: A | Discharge status: Home / Self Care | Payer: 59 | Source: Ambulatory Visit | Attending: Orthopedic Surgery | Admitting: Orthopedic Surgery

## 2015-03-14 ENCOUNTER — Encounter (HOSPITAL_BASED_OUTPATIENT_CLINIC_OR_DEPARTMENT_OTHER): Payer: Self-pay | Admitting: *Deleted

## 2015-03-14 ENCOUNTER — Ambulatory Visit (HOSPITAL_BASED_OUTPATIENT_CLINIC_OR_DEPARTMENT_OTHER): Payer: 59 | Admitting: Anesthesiology

## 2015-03-14 ENCOUNTER — Encounter (HOSPITAL_BASED_OUTPATIENT_CLINIC_OR_DEPARTMENT_OTHER)
Admission: RE | Disposition: A | Discharge status: Home / Self Care | Payer: Self-pay | Source: Ambulatory Visit | Attending: Orthopedic Surgery

## 2015-03-14 DIAGNOSIS — Z85828 Personal history of other malignant neoplasm of skin: Secondary | ICD-10-CM | POA: Insufficient documentation

## 2015-03-14 DIAGNOSIS — E785 Hyperlipidemia, unspecified: Secondary | ICD-10-CM | POA: Diagnosis not present

## 2015-03-14 DIAGNOSIS — M05741 Rheumatoid arthritis with rheumatoid factor of right hand without organ or systems involvement: Secondary | ICD-10-CM

## 2015-03-14 DIAGNOSIS — F429 Obsessive-compulsive disorder, unspecified: Secondary | ICD-10-CM | POA: Diagnosis not present

## 2015-03-14 DIAGNOSIS — Z882 Allergy status to sulfonamides status: Secondary | ICD-10-CM | POA: Diagnosis not present

## 2015-03-14 DIAGNOSIS — I493 Ventricular premature depolarization: Secondary | ICD-10-CM | POA: Insufficient documentation

## 2015-03-14 DIAGNOSIS — M05742 Rheumatoid arthritis with rheumatoid factor of left hand without organ or systems involvement: Secondary | ICD-10-CM

## 2015-03-14 DIAGNOSIS — M19041 Primary osteoarthritis, right hand: Secondary | ICD-10-CM | POA: Insufficient documentation

## 2015-03-14 DIAGNOSIS — F329 Major depressive disorder, single episode, unspecified: Secondary | ICD-10-CM | POA: Diagnosis not present

## 2015-03-14 DIAGNOSIS — Z881 Allergy status to other antibiotic agents status: Secondary | ICD-10-CM | POA: Insufficient documentation

## 2015-03-14 DIAGNOSIS — M797 Fibromyalgia: Secondary | ICD-10-CM | POA: Diagnosis not present

## 2015-03-14 DIAGNOSIS — M069 Rheumatoid arthritis, unspecified: Secondary | ICD-10-CM | POA: Insufficient documentation

## 2015-03-14 DIAGNOSIS — Z91040 Latex allergy status: Secondary | ICD-10-CM | POA: Diagnosis not present

## 2015-03-14 DIAGNOSIS — Z888 Allergy status to other drugs, medicaments and biological substances status: Secondary | ICD-10-CM | POA: Diagnosis not present

## 2015-03-14 DIAGNOSIS — F419 Anxiety disorder, unspecified: Secondary | ICD-10-CM | POA: Diagnosis not present

## 2015-03-14 DIAGNOSIS — K219 Gastro-esophageal reflux disease without esophagitis: Secondary | ICD-10-CM | POA: Insufficient documentation

## 2015-03-14 DIAGNOSIS — G4733 Obstructive sleep apnea (adult) (pediatric): Secondary | ICD-10-CM | POA: Insufficient documentation

## 2015-03-14 DIAGNOSIS — I10 Essential (primary) hypertension: Secondary | ICD-10-CM | POA: Diagnosis not present

## 2015-03-14 DIAGNOSIS — M8580 Other specified disorders of bone density and structure, unspecified site: Secondary | ICD-10-CM | POA: Insufficient documentation

## 2015-03-14 DIAGNOSIS — Z87891 Personal history of nicotine dependence: Secondary | ICD-10-CM | POA: Insufficient documentation

## 2015-03-14 DIAGNOSIS — K449 Diaphragmatic hernia without obstruction or gangrene: Secondary | ICD-10-CM | POA: Diagnosis not present

## 2015-03-14 DIAGNOSIS — L4050 Arthropathic psoriasis, unspecified: Secondary | ICD-10-CM | POA: Diagnosis not present

## 2015-03-14 DIAGNOSIS — G4700 Insomnia, unspecified: Secondary | ICD-10-CM | POA: Insufficient documentation

## 2015-03-14 HISTORY — DX: Depression, unspecified: F32.A

## 2015-03-14 HISTORY — PX: DISTAL INTERPHALANGEAL JOINT FUSION: SHX6428

## 2015-03-14 HISTORY — DX: Nausea with vomiting, unspecified: R11.2

## 2015-03-14 HISTORY — DX: Other specified postprocedural states: Z98.890

## 2015-03-14 HISTORY — DX: Personal history of other diseases of the digestive system: Z87.19

## 2015-03-14 HISTORY — DX: Personal history of other malignant neoplasm of skin: Z85.828

## 2015-03-14 HISTORY — DX: Personal history of other infectious and parasitic diseases: Z86.19

## 2015-03-14 HISTORY — DX: Anxiety disorder, unspecified: F41.9

## 2015-03-14 HISTORY — DX: Major depressive disorder, single episode, unspecified: F32.9

## 2015-03-14 HISTORY — DX: Obstructive sleep apnea (adult) (pediatric): G47.33

## 2015-03-14 LAB — POCT I-STAT 4, (NA,K, GLUC, HGB,HCT)
Glucose, Bld: 107 mg/dL — ABNORMAL HIGH (ref 65–99)
HCT: 40 % (ref 36.0–46.0)
Hemoglobin: 13.6 g/dL (ref 12.0–15.0)
Potassium: 3.9 mmol/L (ref 3.5–5.1)
Sodium: 137 mmol/L (ref 135–145)

## 2015-03-14 SURGERY — DISTAL INTERPHALANGEAL JOINT FUSION
Anesthesia: General | Site: Finger | Laterality: Right

## 2015-03-14 MED ORDER — LACTATED RINGERS IV SOLN
INTRAVENOUS | Status: DC
  Administered 2015-03-14 (×2): via INTRAVENOUS
  Filled 2015-03-14: qty 1000

## 2015-03-14 MED ORDER — OXYCODONE HCL 5 MG PO TABS
5.0000 mg | ORAL_TABLET | Freq: Once | ORAL | Status: AC | PRN
  Administered 2015-03-14: 5 mg via ORAL
  Filled 2015-03-14: qty 1

## 2015-03-14 MED ORDER — PHENYLEPHRINE HCL 10 MG/ML IJ SOLN
INTRAMUSCULAR | Status: DC | PRN
  Administered 2015-03-14: 40 ug via INTRAVENOUS
  Administered 2015-03-14 (×2): 80 ug via INTRAVENOUS

## 2015-03-14 MED ORDER — HYDROMORPHONE HCL 1 MG/ML IJ SOLN
INTRAMUSCULAR | Status: AC
  Filled 2015-03-14: qty 1

## 2015-03-14 MED ORDER — CHLORHEXIDINE GLUCONATE 4 % EX LIQD
60.0000 mL | Freq: Once | CUTANEOUS | Status: DC
  Filled 2015-03-14: qty 60

## 2015-03-14 MED ORDER — FENTANYL CITRATE (PF) 100 MCG/2ML IJ SOLN
INTRAMUSCULAR | Status: DC | PRN
  Administered 2015-03-14: 100 ug via INTRAVENOUS

## 2015-03-14 MED ORDER — OXYCODONE HCL 5 MG PO TABS
ORAL_TABLET | ORAL | Status: AC
  Filled 2015-03-14: qty 1

## 2015-03-14 MED ORDER — CLINDAMYCIN PHOSPHATE 900 MG/50ML IV SOLN
900.0000 mg | INTRAVENOUS | Status: AC
  Administered 2015-03-14: 50 mg via INTRAVENOUS
  Filled 2015-03-14: qty 50

## 2015-03-14 MED ORDER — LIDOCAINE HCL (CARDIAC) 20 MG/ML IV SOLN
INTRAVENOUS | Status: DC | PRN
  Administered 2015-03-14: 65 mg via INTRAVENOUS

## 2015-03-14 MED ORDER — SCOPOLAMINE 1 MG/3DAYS TD PT72
MEDICATED_PATCH | TRANSDERMAL | Status: AC
  Filled 2015-03-14: qty 1

## 2015-03-14 MED ORDER — CLINDAMYCIN PHOSPHATE 900 MG/50ML IV SOLN
INTRAVENOUS | Status: AC
  Filled 2015-03-14: qty 50

## 2015-03-14 MED ORDER — ACETAMINOPHEN 10 MG/ML IV SOLN
INTRAVENOUS | Status: DC | PRN
  Administered 2015-03-14: 1000 mg via INTRAVENOUS

## 2015-03-14 MED ORDER — PROPOFOL 10 MG/ML IV BOLUS
INTRAVENOUS | Status: DC | PRN
  Administered 2015-03-14: 200 mg via INTRAVENOUS

## 2015-03-14 MED ORDER — MIDAZOLAM HCL 2 MG/2ML IJ SOLN
INTRAMUSCULAR | Status: AC
  Filled 2015-03-14: qty 2

## 2015-03-14 MED ORDER — PROMETHAZINE HCL 25 MG/ML IJ SOLN
6.2500 mg | INTRAMUSCULAR | Status: DC | PRN
  Filled 2015-03-14: qty 1

## 2015-03-14 MED ORDER — HYDROMORPHONE HCL 1 MG/ML IJ SOLN
0.2500 mg | INTRAMUSCULAR | Status: DC | PRN
  Administered 2015-03-14: 0.5 mg via INTRAVENOUS
  Administered 2015-03-14: 0.25 mg via INTRAVENOUS
  Filled 2015-03-14: qty 1

## 2015-03-14 MED ORDER — MEPERIDINE HCL 25 MG/ML IJ SOLN
6.2500 mg | INTRAMUSCULAR | Status: DC | PRN
  Filled 2015-03-14: qty 1

## 2015-03-14 MED ORDER — BUPIVACAINE HCL (PF) 0.25 % IJ SOLN
INTRAMUSCULAR | Status: DC | PRN
  Administered 2015-03-14: 5 mL

## 2015-03-14 MED ORDER — ONDANSETRON HCL 4 MG/2ML IJ SOLN
INTRAMUSCULAR | Status: DC | PRN
  Administered 2015-03-14: 4 mg via INTRAVENOUS

## 2015-03-14 MED ORDER — SCOPOLAMINE 1 MG/3DAYS TD PT72
MEDICATED_PATCH | TRANSDERMAL | Status: DC | PRN
  Administered 2015-03-14: 1 via TRANSDERMAL

## 2015-03-14 MED ORDER — FENTANYL CITRATE (PF) 100 MCG/2ML IJ SOLN
INTRAMUSCULAR | Status: AC
  Filled 2015-03-14: qty 2

## 2015-03-14 MED ORDER — MIDAZOLAM HCL 5 MG/5ML IJ SOLN
INTRAMUSCULAR | Status: DC | PRN
  Administered 2015-03-14: 2 mg via INTRAVENOUS

## 2015-03-14 MED ORDER — DEXAMETHASONE SODIUM PHOSPHATE 4 MG/ML IJ SOLN
INTRAMUSCULAR | Status: DC | PRN
  Administered 2015-03-14: 10 mg via INTRAVENOUS

## 2015-03-14 MED ORDER — OXYCODONE HCL 5 MG/5ML PO SOLN
5.0000 mg | Freq: Once | ORAL | Status: AC | PRN
  Filled 2015-03-14: qty 5

## 2015-03-14 SURGICAL SUPPLY — 65 items
APL SKNCLS STERI-STRIP NONHPOA (GAUZE/BANDAGES/DRESSINGS)
BANDAGE CONFORM 3  STR LF (GAUZE/BANDAGES/DRESSINGS) IMPLANT
BANDAGE ELASTIC 3 VELCRO ST LF (GAUZE/BANDAGES/DRESSINGS) IMPLANT
BANDAGE ELASTIC 4 VELCRO ST LF (GAUZE/BANDAGES/DRESSINGS) IMPLANT
BANDAGE GAUZE ELAST BULKY 4 IN (GAUZE/BANDAGES/DRESSINGS) IMPLANT
BENZOIN TINCTURE PRP APPL 2/3 (GAUZE/BANDAGES/DRESSINGS) IMPLANT
BLADE SURG 15 STRL LF DISP TIS (BLADE) ×1 IMPLANT
BLADE SURG 15 STRL SS (BLADE) ×2
BNDG CMPR 9X4 STRL LF SNTH (GAUZE/BANDAGES/DRESSINGS)
BNDG COHESIVE 1X5 TAN STRL LF (GAUZE/BANDAGES/DRESSINGS) ×1 IMPLANT
BNDG COHESIVE 3X5 TAN STRL LF (GAUZE/BANDAGES/DRESSINGS) IMPLANT
BNDG ESMARK 4X9 LF (GAUZE/BANDAGES/DRESSINGS) IMPLANT
BNDG GAUZE ELAST 4 BULKY (GAUZE/BANDAGES/DRESSINGS) IMPLANT
CLOTH BEACON ORANGE TIMEOUT ST (SAFETY) ×2 IMPLANT
CORDS BIPOLAR (ELECTRODE) IMPLANT
COVER BACK TABLE 60X90IN (DRAPES) ×2 IMPLANT
CUFF TOURNIQUET SINGLE 18IN (TOURNIQUET CUFF) ×2 IMPLANT
CUFF TOURNIQUET SINGLE 24IN (TOURNIQUET CUFF) IMPLANT
DRAIN PENROSE 18X1/4 LTX STRL (WOUND CARE) IMPLANT
DRAPE EXTREMITY T 121X128X90 (DRAPE) ×2 IMPLANT
DRAPE OEC MINIVIEW 54X84 (DRAPES) IMPLANT
DRAPE SURG 17X23 STRL (DRAPES) IMPLANT
DRSG EMULSION OIL 3X3 NADH (GAUZE/BANDAGES/DRESSINGS) ×1 IMPLANT
ELECT NDL TIP 2.8 STRL (NEEDLE) IMPLANT
ELECT NEEDLE TIP 2.8 STRL (NEEDLE) IMPLANT
ELECT REM PT RETURN 9FT ADLT (ELECTROSURGICAL) ×2
ELECTRODE REM PT RTRN 9FT ADLT (ELECTROSURGICAL) ×1 IMPLANT
GAUZE SPONGE 4X4 16PLY XRAY LF (GAUZE/BANDAGES/DRESSINGS) IMPLANT
GAUZE XEROFORM 1X8 LF (GAUZE/BANDAGES/DRESSINGS) ×2 IMPLANT
GLOVE BIO SURGEON STRL SZ8 (GLOVE) ×2 IMPLANT
GOWN STRL REUS W/TWL LRG LVL3 (GOWN DISPOSABLE) ×2 IMPLANT
GOWN STRL REUS W/TWL XL LVL3 (GOWN DISPOSABLE) ×2 IMPLANT
K-WIRE .035X4 (WIRE) ×1 IMPLANT
K-WIRE .045X4 (WIRE) ×1 IMPLANT
KIT ROOM TURNOVER WOR (KITS) ×2 IMPLANT
LOOP VESSEL MAXI BLUE (MISCELLANEOUS) IMPLANT
MANIFOLD NEPTUNE II (INSTRUMENTS) IMPLANT
NDL HYPO 25X1 1.5 SAFETY (NEEDLE) IMPLANT
NEEDLE HYPO 25X1 1.5 SAFETY (NEEDLE) IMPLANT
NS IRRIG 500ML POUR BTL (IV SOLUTION) ×2 IMPLANT
PACK BASIN DAY SURGERY FS (CUSTOM PROCEDURE TRAY) ×2 IMPLANT
PAD CAST 3X4 CTTN HI CHSV (CAST SUPPLIES) ×1 IMPLANT
PADDING CAST ABS 4INX4YD NS (CAST SUPPLIES) ×1
PADDING CAST ABS COTTON 4X4 ST (CAST SUPPLIES) ×1 IMPLANT
PADDING CAST COTTON 3X4 STRL (CAST SUPPLIES) ×2
PENCIL BUTTON HOLSTER BLD 10FT (ELECTRODE) IMPLANT
SPLINT PLASTER CAST XFAST 3X15 (CAST SUPPLIES) IMPLANT
SPLINT PLASTER CAST XFAST 4X15 (CAST SUPPLIES) IMPLANT
SPLINT PLASTER XTRA FAST SET 4 (CAST SUPPLIES)
SPLINT PLASTER XTRA FASTSET 3X (CAST SUPPLIES)
SPONGE GAUZE 4X4 12PLY (GAUZE/BANDAGES/DRESSINGS) ×2 IMPLANT
SPONGE GAUZE 4X4 12PLY STER LF (GAUZE/BANDAGES/DRESSINGS) ×1 IMPLANT
STOCKINETTE 4X48 STRL (DRAPES) ×2 IMPLANT
STRIP CLOSURE SKIN 1/2X4 (GAUZE/BANDAGES/DRESSINGS) IMPLANT
SUT ETHILON 4 0 P 3 18 (SUTURE) IMPLANT
SUT ETHILON 5 0 P 3 18 (SUTURE)
SUT NYLON ETHILON 5-0 P-3 1X18 (SUTURE) IMPLANT
SUT PROLENE 4 0 PS 2 18 (SUTURE) ×2 IMPLANT
SUT VIC AB 3-0 PS2 18 (SUTURE) ×2
SUT VIC AB 3-0 PS2 18XBRD (SUTURE) IMPLANT
SYR BULB 3OZ (MISCELLANEOUS) ×2 IMPLANT
SYR CONTROL 10ML LL (SYRINGE) IMPLANT
TOWEL OR 17X24 6PK STRL BLUE (TOWEL DISPOSABLE) ×4 IMPLANT
UNDERPAD 30X30 INCONTINENT (UNDERPADS AND DIAPERS) ×2 IMPLANT
WATER STERILE IRR 500ML POUR (IV SOLUTION) ×2 IMPLANT

## 2015-03-14 NOTE — Anesthesia Postprocedure Evaluation (Signed)
  Anesthesia Post-op Note  Patient: Janet Peterson  Procedure(s) Performed: Procedure(s) (LRB): RIGHT LONG FINGER DISTAL INTERPHALANGEAL JOINT ARTHRODESIS (Right)  Patient Location: PACU  Anesthesia Type: General  Level of Consciousness: awake and alert   Airway and Oxygen Therapy: Patient Spontanous Breathing  Post-op Pain: mild  Post-op Assessment: Post-op Vital signs reviewed, Patient's Cardiovascular Status Stable, Respiratory Function Stable, Patent Airway and No signs of Nausea or vomiting  Last Vitals:  Filed Vitals:   03/14/15 1501  BP:   Pulse: 83  Temp:   Resp: 14    Post-op Vital Signs: stable   Complications: No apparent anesthesia complications

## 2015-03-14 NOTE — Anesthesia Procedure Notes (Signed)
Procedure Name: LMA Insertion Date/Time: 03/14/2015 1:36 PM Performed by: Wanita Chamberlain Pre-anesthesia Checklist: Patient identified, Timeout performed, Emergency Drugs available, Suction available and Patient being monitored Patient Re-evaluated:Patient Re-evaluated prior to inductionOxygen Delivery Method: Circle system utilized Preoxygenation: Pre-oxygenation with 100% oxygen Intubation Type: IV induction LMA: LMA inserted LMA Size: 4.0 Number of attempts: 1 Placement Confirmation: breath sounds checked- equal and bilateral and positive ETCO2 Tube secured with: Tape Dental Injury: Teeth and Oropharynx as per pre-operative assessment

## 2015-03-14 NOTE — H&P (Signed)
Janet Peterson is an 64 y.o. female.   Chief Complaint: RIGHT LONG FINGER PAIN HPI: PT WITH PERSISTENT PAIN AND DEFORMITY TO RIGHT LONG FINGER NO PRIOR SURGERY TO RIGHT LONG FINGER PT HERE FOR SURGERY  Past Medical History  Diagnosis Date  . OCD (obsessive compulsive disorder)   . GERD (gastroesophageal reflux disease)   . Hypertension   . Insomnia   . Fibromyalgia   . Osteopenia   . PVC (premature ventricular contraction)   . Hyperlipidemia   . Psoriatic arthritis ([place])   . Rheumatoid arthritis ([place])   . History of basal cell carcinoma excision     FACE, [place]  . Anxiety and depression   . History of hiatal hernia   . History of thrush   . OSA (obstructive sleep apnea)     MILD AND NO CPAP SINCE SURGERY IN 2007  . PONV (postoperative nausea and vomiting)     Past Surgical History  Procedure Laterality Date  . Bunionectomy  1991  . Cesarean section  1985  . Shoulder surgery Right 1997  . Posterior vitrectomy right eye and laser   10-27-1999  . Eye surgery  1995    rk (laser surgery), semi cornea transplant, detacted retina,  fluid removal  . Knee arthroscopy Left 03-03-2004  . Total knee arthroplasty Left 12-14-2005  . Uvulopalatopharyngoplasty  04-26-2006    w/  TONSILLECTOMY/  TURBINATE REDUCTIONS/  BILATERAL ANTERIOR ETHMOIDECTOMY  . Thoracic fusion  03-13-2013    REMOVAL HARDWARE/  BONE GRAFT FUSION T10//  REVISION OF RODS  . Cervical fusion  March 2013    C5 -- C7  . Breast surgery  1975    lumpectomy-- benign  . Spinal fixation surgery w/ implant  2013 rod #1//  2014  rod 2    S1 -- T10  (rod #1)//   S1 -- T4 (rod #2)  . Cataract extraction w/ intraocular lens  implant, bilateral  2000    Family History  Problem Relation Age of Onset  . Diabetes Mother   . Heart disease Mother   . Diabetes Father   . Anuerysm Father   . Diabetes Brother   . Heart disease Brother    Social History:  reports that she quit smoking about 20 years ago. Her smoking use  included Cigarettes. She has a 5 pack-year smoking history. She has never used smokeless tobacco. She reports that she drinks alcohol. She reports that she does not use illicit drugs.  Allergies:  Allergies  Allergen Reactions  . Latex Rash    And Mouth sores  . Amlodipine Besy-Benazepril Hcl Other (See Comments)    COUGH  . Augmentin [Amoxicillin-Pot Clavulanate] Diarrhea and Nausea And Vomiting  . Bextra [Valdecoxib] Diarrhea and Nausea And Vomiting    REFLUX  . Lipitor [Atorvastatin Calcium] Other (See Comments)    MYALGIAS  . Sulfa Antibiotics Nausea And Vomiting    CRAMPING  . Zocor [Simvastatin] Other (See Comments)    MYALGIA    No prescriptions prior to admission    No results found for this or any previous visit (from the past 52 hour(s)). No results found.  ROS  NO RECENT ILLNESSES OR HOSPITALIZATIONS   Height 5\' 4"  (1.626 m), weight 83.915 kg (185 lb). Physical Exam  General Appearance:  Alert, cooperative, no distress, appears stated age  Head:  Normocephalic, without obvious abnormality, atraumatic  Eyes:  Pupils equal, conjunctiva/corneas clear,         Throat:  Lips, mucosa, and tongue normal; teeth and gums normal  Neck: No visible masses     Lungs:   respirations unlabored  Chest Wall:  No tenderness or deformity  Heart:  Regular rate and rhythm,  Abdomen:   Soft, non-tender,         Extremities: RIGHT LONG: MARKED DEFORMITY TO LONG FINGER +HEBERDENS NODULES TO FINGERS GOOD THUMB MOBILITY GOOD WRIST AND FOREARM MOBILITY  Pulses: 2+ and symmetric  Skin: Skin color, texture, turgor normal, no rashes or lesions     Neurologic: Normal    Assessment/Plan RIGHT LONG FINGER ADVANCED DISTAL INTERPHALANGEAL JOINT BONE ON BONE ARTHRITIS  RIGHT LONG FINGER DISTAL INTERPHALANGEAL JOINT ARTHRODESIS  R/B/A DISCUSSED WITH PT IN OFFICE.  PT VOICED UNDERSTANDING OF PLAN CONSENT SIGNED DAY OF SURGERY PT SEEN AND EXAMINED PRIOR TO OPERATIVE PROCEDURE/DAY OF  SURGERY SITE MARKED. QUESTIONS ANSWERED WILL GO HOME FOLLOWING SURGERY  WE ARE PLANNING SURGERY FOR YOUR UPPER EXTREMITY. THE RISKS AND BENEFITS OF SURGERY INCLUDE BUT NOT LIMITED TO BLEEDING INFECTION, DAMAGE TO NEARBY NERVES ARTERIES TENDONS, FAILURE OF SURGERY TO ACCOMPLISH ITS INTENDED GOALS, PERSISTENT SYMPTOMS AND NEED FOR FURTHER SURGICAL INTERVENTION. WITH THIS IN MIND WE WILL PROCEED. I HAVE DISCUSSED WITH THE PATIENT THE PRE AND POSTOPERATIVE REGIMEN AND THE DOS AND DON'TS. PT VOICED UNDERSTANDING AND INFORMED CONSENT SIGNED.  Linna Hoff 03/14/2015, 7:43 AM

## 2015-03-14 NOTE — Anesthesia Preprocedure Evaluation (Addendum)
Anesthesia Evaluation  Patient identified by MRN, date of birth, ID band Patient awake    Reviewed: Allergy & Precautions, NPO status , Patient's Chart, lab work & pertinent test results, reviewed documented beta blocker date and time   History of Anesthesia Complications (+) PONV and history of anesthetic complications  Airway Mallampati: II  TM Distance: >3 FB Neck ROM: Limited    Dental no notable dental hx. (+) Teeth Intact, Dental Advisory Given   Pulmonary sleep apnea , former smoker,    Pulmonary exam normal breath sounds clear to auscultation       Cardiovascular hypertension, Pt. on medications and Pt. on home beta blockers Normal cardiovascular exam Rhythm:Regular Rate:Normal     Neuro/Psych PSYCHIATRIC DISORDERS negative neurological ROS     GI/Hepatic Neg liver ROS, hiatal hernia, GERD  ,  Endo/Other  negative endocrine ROS  Renal/GU negative Renal ROS     Musculoskeletal  (+) Arthritis , Fibromyalgia -, narcotic dependent  Abdominal (+) + obese,   Peds  Hematology negative hematology ROS (+)   Anesthesia Other Findings Cervical to lumbar fusion  Reproductive/Obstetrics negative OB ROS                          Anesthesia Physical Anesthesia Plan  ASA: II  Anesthesia Plan: General   Post-op Pain Management:    Induction: Intravenous  Airway Management Planned: LMA  Additional Equipment:   Intra-op Plan:   Post-operative Plan: Extubation in OR  Informed Consent: I have reviewed the patients History and Physical, chart, labs and discussed the procedure including the risks, benefits and alternatives for the proposed anesthesia with the patient or authorized representative who has indicated his/her understanding and acceptance.   Dental advisory given  Plan Discussed with: CRNA  Anesthesia Plan Comments:         Anesthesia Quick Evaluation

## 2015-03-14 NOTE — Brief Op Note (Signed)
03/14/2015  12:40 PM  PATIENT:  Janet Peterson  64 y.o. 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   POST-OPERATIVE DIAGNOSIS:  * No post-op diagnosis entered *  PROCEDURE:  Procedure(s): RIGHT LONG FINGER DISTAL INTERPHALANGEAL JOINT ARTHRODESIS (Right)  SURGEON:  Surgeon(s) and Role:    * Iran Planas, MD - Primary  PHYSICIAN ASSISTANT:   ASSISTANTS: none   ANESTHESIA:   general  EBL:     BLOOD ADMINISTERED:none  DRAINS: none   LOCAL MEDICATIONS USED:  MARCAINE     SPECIMEN:  No Specimen  DISPOSITION OF SPECIMEN:  N/A  COUNTS:  YES  TOURNIQUET:  * No tourniquets in log *  DICTATION: .Other Dictation: Dictation Number [id number]  PLAN OF CARE: Discharge to home after PACU  PATIENT DISPOSITION:  PACU - hemodynamically stable.   Delay start of Pharmacological VTE agent (>24hrs) due to surgical blood loss or risk of bleeding: not applicable

## 2015-03-14 NOTE — Transfer of Care (Signed)
Immediate Anesthesia Transfer of Care Note  Patient: Janet Peterson  Procedure(s) Performed: Procedure(s): RIGHT LONG FINGER DISTAL INTERPHALANGEAL JOINT ARTHRODESIS (Right)  Patient Location: PACU  Anesthesia Type:General  Level of Consciousness: awake, alert , oriented and patient cooperative  Airway & Oxygen Therapy: Patient Spontanous Breathing and Patient connected to nasal cannula oxygen  Post-op Assessment: Report given to RN and Post -op Vital signs reviewed and stable  Post vital signs: Reviewed and stable  Last Vitals:  Filed Vitals:   03/14/15 1155  BP: 124/71  Pulse: 85  Temp: 37.1 C  Resp: 16    Complications: No apparent anesthesia complications

## 2015-03-14 NOTE — Discharge Instructions (Signed)
KEEP BANDAGE CLEAN AND DRY CALL OFFICE FOR F/U APPT [phone] IN 14 DAYS KEEP HAND ELEVATED ABOVE HEART OK TO APPLY ICE TO OPERATIVE AREA CONTACT OFFICE IF ANY WORSENING PAIN OR CONCERNS.Call your surgeon if you experience:   1.  Fever over 101.0. 2.  Inability to urinate. 3.  Nausea and/or vomiting. 4.  Extreme swelling or bruising at the surgical site. 5.  Continued bleeding from the incision. 6.  Increased pain, redness or drainage from the incision. 7.  Problems related to your pain medication. 8. Any change in color, movement and/or sensation 9. Any problems and/or concerns  Post Anesthesia Home Care Instructions  Activity: Get plenty of rest for the remainder of the day. A responsible adult should stay with you for 24 hours following the procedure.  For the next 24 hours, DO NOT: -Drive a car -Paediatric nurse -Drink alcoholic beverages -Take any medication unless instructed by your physician -Make any legal decisions or sign important papers.  Meals: Start with liquid foods such as gelatin or soup. Progress to regular foods as tolerated. Avoid greasy, spicy, heavy foods. If nausea and/or vomiting occur, drink only clear liquids until the nausea and/or vomiting subsides. Call your physician if vomiting continues.  Special Instructions/Symptoms: Your throat may feel dry or sore from the anesthesia or the breathing tube placed in your throat during surgery. If this causes discomfort, gargle with warm salt water. The discomfort should disappear within 24 hours.  If you had a scopolamine patch placed behind your ear for the management of post- operative nausea and/or vomiting:  1. The medication in the patch is effective for 72 hours, after which it should be removed.  Wrap patch in a tissue and discard in the trash. Wash hands thoroughly with soap and water. 2. You may remove the patch earlier than 72 hours if you experience unpleasant side effects which may include dry mouth,  dizziness or visual disturbances. 3. Avoid touching the patch. Wash your hands with soap and water after contact with the patch.

## 2015-03-15 ENCOUNTER — Encounter (HOSPITAL_BASED_OUTPATIENT_CLINIC_OR_DEPARTMENT_OTHER): Payer: Self-pay | Admitting: Orthopedic Surgery

## 2015-03-15 NOTE — Op Note (Signed)
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  ASSISTANT:  None.  ANESTHESIA:  General via LMA.  SURGICAL PROCEDURE: 1. Right long finger distal interphalangeal joint arthrodesis without     graft. 2. Radiographs, two-views, right long finger.  SURGICAL IMPLANTS:  One 0.045 K-wire and one 0.035 K-wire.  SURGICAL INDICATIONS:  The patient is a right-hand-dominant female with end-stage bone-on-bone osteoarthritis of the distal interphalangeal joint of the long finger.  The patient had the marked deformity; and after talking with her in detail, we elected to undergo the above procedure.  Risks, benefits, and alternatives were discussed in detail with the patient.  Signed informed consent was obtained.  Risks include but are not limited to bleeding, infection, damage to nearby nerves, arteries, and tendons, loss of motion of the wrist and digits, incomplete relief of symptoms, need for further surgical intervention.  DESCRIPTION OF PROCEDURE:  The patient was properly identified in the preop holding area, marked with a permanent marker made on the right long finger to indicate the correct operative site.  The patient was then brought back to the operating room, placed supine on the anesthesia room table.  General anesthesia was administered.  Antibiotics were given prior to  the skin incision.  A well-padded tourniquet was placed on the right brachium and sealed with a 1000 drape.  Right upper extremity was then prepped and draped in normal sterile fashion.  A time- out was called, correct side was identified, and the procedure was then begun.  Attention was then turned to where the right long finger, where an H-shaped incision was then made directly over the distal interphalangeal joint.  Dissection carried all the way down to the joint.  The extensor mechanism was incised transversely.  The joint was then exposed.  Removal of the large osteophytes were then done with rongeurs.  Denuding in the articular surface with remaining articular cartilage was then carried out with a rongeur.  This was taken all the way back on both surfaces to cancellous bone.  Once the articular surfaces were repaired, it was felt based on the amount of bone that it was more amenable to K-wire fixation rather than intramedullary screw. The wound was then thoroughly irrigated.  The joint surfaces were coapted together.  Once this was carried out, a 0.045 K-wire was then placed antegrade and then back retrograde across nicely.  This was also done with a 0.035 K-wire crossing in different planes.  The K-wires were cut and then left underneath the skin.  The K-wires were confirmed using mini C-arm.  Final radiographs were then obtained.  This showed a nice coapted surfaces.  Wound was then irrigated.  The extensor mechanism then closed.  The skin was closed with 4-0 Prolene sutures.  Adaptic dressing, sterile compressive bandage were applied.  Tourniquet deflated.  There was good perfusion of the fingertips.  The patient was placed in a small splint and taken to recovery room in good condition. Intraoperative radiographs, AP and lateral views and oblique views of the finger did show the K-wire fixation placed in quite good position.  POSTPROCEDURE PLAN:  The patient will be discharged  to home, will be seen back in the office in approximately two weeks for wound check, suture removal, x-rays.  The patient can go to a therapist for a small tip protector splint.  K-wires in for a total of 12 weeks.  Radiographs at each visit.     Melrose Nakayama, M.D.     FWO/MEDQ  D:  03/15/2015  T:  03/15/2015  Job:  [id number]

## 2015-03-19 ENCOUNTER — Ambulatory Visit: Payer: 59 | Admitting: Family Medicine

## 2015-03-21 ENCOUNTER — Telehealth: Payer: Self-pay | Admitting: Family Medicine

## 2015-03-21 NOTE — Telephone Encounter (Signed)
Patient says that the lexapro is making her jittery  [phone]

## 2015-03-22 MED ORDER — VENLAFAXINE HCL 75 MG PO TABS: ORAL_TABLET | ORAL | Status: DC

## 2015-03-22 NOTE — Telephone Encounter (Signed)
Try 1/2 a pill per day and increase in 2 weeks once she has adjusted.

## 2015-03-22 NOTE — Telephone Encounter (Signed)
Pt states that the lexapro 10 did not help and the 20mg  makes her too jittery and WTP had mentioned taking Effexor and she would like to switch.  Per WTP ok to switch Effexor 75mg  po qam x 2 weeks then increase to 150mg  po qam.  Pt aware and med sent to pharm. Once pt hits the 150mg  will call in different RX for the 150mg  if able to tolerate.

## 2015-03-28 DIAGNOSIS — Z4789 Encounter for other orthopedic aftercare: Secondary | ICD-10-CM | POA: Diagnosis not present

## 2015-03-29 ENCOUNTER — Other Ambulatory Visit: Payer: Self-pay | Admitting: Family Medicine

## 2015-03-29 NOTE — Telephone Encounter (Signed)
Med denied has been discontinued

## 2015-04-05 DIAGNOSIS — M7989 Other specified soft tissue disorders: Secondary | ICD-10-CM | POA: Diagnosis not present

## 2015-04-05 DIAGNOSIS — Z4789 Encounter for other orthopedic aftercare: Secondary | ICD-10-CM | POA: Diagnosis not present

## 2015-04-05 DIAGNOSIS — M19041 Primary osteoarthritis, right hand: Secondary | ICD-10-CM | POA: Diagnosis not present

## 2015-04-17 DIAGNOSIS — F329 Major depressive disorder, single episode, unspecified: Secondary | ICD-10-CM | POA: Diagnosis not present

## 2015-04-25 DIAGNOSIS — G894 Chronic pain syndrome: Secondary | ICD-10-CM | POA: Diagnosis not present

## 2015-04-25 DIAGNOSIS — M4324 Fusion of spine, thoracic region: Secondary | ICD-10-CM | POA: Diagnosis not present

## 2015-04-25 DIAGNOSIS — L4052 Psoriatic arthritis mutilans: Secondary | ICD-10-CM | POA: Diagnosis not present

## 2015-04-25 DIAGNOSIS — M4326 Fusion of spine, lumbar region: Secondary | ICD-10-CM | POA: Diagnosis not present

## 2015-04-26 DIAGNOSIS — Z4789 Encounter for other orthopedic aftercare: Secondary | ICD-10-CM | POA: Diagnosis not present

## 2015-04-26 DIAGNOSIS — M19041 Primary osteoarthritis, right hand: Secondary | ICD-10-CM | POA: Diagnosis not present

## 2015-05-17 DIAGNOSIS — Z4789 Encounter for other orthopedic aftercare: Secondary | ICD-10-CM | POA: Diagnosis not present

## 2015-05-17 DIAGNOSIS — M19041 Primary osteoarthritis, right hand: Secondary | ICD-10-CM | POA: Diagnosis not present

## 2015-05-26 ENCOUNTER — Other Ambulatory Visit: Payer: Self-pay | Admitting: Family Medicine

## 2015-06-07 DIAGNOSIS — Z4789 Encounter for other orthopedic aftercare: Secondary | ICD-10-CM | POA: Diagnosis not present

## 2015-06-08 ENCOUNTER — Other Ambulatory Visit: Payer: Self-pay | Admitting: Family Medicine

## 2015-06-11 DIAGNOSIS — Z4789 Encounter for other orthopedic aftercare: Secondary | ICD-10-CM | POA: Diagnosis not present

## 2015-06-12 ENCOUNTER — Telehealth: Payer: Self-pay | Admitting: Family Medicine

## 2015-06-12 NOTE — Telephone Encounter (Signed)
PA submitted to [email or web address] for Zetia. DX: E78.2 - unable to tolerate statins

## 2015-06-14 MED ORDER — EZETIMIBE 10 MG PO TABS: ORAL_TABLET | ORAL | Status: DC

## 2015-06-14 NOTE — Telephone Encounter (Signed)
Received PA determination.   PA approved 03/15/2015- 06/12/2016.  Pharmacy made aware.

## 2015-06-19 ENCOUNTER — Telehealth: Payer: Self-pay | Admitting: Family Medicine

## 2015-06-19 DIAGNOSIS — F329 Major depressive disorder, single episode, unspecified: Secondary | ICD-10-CM | POA: Diagnosis not present

## 2015-06-19 MED ORDER — DEXLANSOPRAZOLE 60 MG PO CPDR: 1.0000 | DELAYED_RELEASE_CAPSULE | Freq: Every day | ORAL | Status: DC

## 2015-06-19 NOTE — Telephone Encounter (Signed)
A submitted for Dexilant and approved from 03/26/2015 - 06/17/2016. Pharm aware

## 2015-06-20 ENCOUNTER — Encounter: Payer: Self-pay | Admitting: Family Medicine

## 2015-06-20 ENCOUNTER — Ambulatory Visit (INDEPENDENT_AMBULATORY_CARE_PROVIDER_SITE_OTHER): Payer: Medicare Other | Admitting: Family Medicine

## 2015-06-20 VITALS — BP 110/68 | HR 68 | Temp 98.4°F | Resp 16 | Ht 64.0 in | Wt 187.0 lb

## 2015-06-20 DIAGNOSIS — E669 Obesity, unspecified: Secondary | ICD-10-CM | POA: Diagnosis not present

## 2015-06-20 DIAGNOSIS — J300 Vasomotor rhinitis: Secondary | ICD-10-CM

## 2015-06-20 MED ORDER — IPRATROPIUM BROMIDE 0.06 % NA SOLN: 2.0000 | Freq: Three times a day (TID) | NASAL | Status: DC

## 2015-06-20 MED ORDER — TOPIRAMATE 25 MG PO CPSP: 50.0000 mg | ORAL_CAPSULE | Freq: Two times a day (BID) | ORAL | Status: DC

## 2015-06-20 NOTE — Progress Notes (Signed)
Subjective:    Patient ID: Janet Peterson, female    DOB: 11-02-1950, 65 y.o.   MRN: [id number]  HPI  patient reports several months of rhinorrhea. The rhinorrhea occurs only when she is eating. There are no specific foods that seem to trigger it. However it is causing her a significant impairment in her quality of life because she is constantly having to blow her nose at the table or wipe her nose. 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 She is tried allergy medication with no benefit. She has not tried [facility]. She denies any fevers or chills or sinus pain. She would also like assistance with weight loss. She is trying different diets include Weight Watchers and Nutrisystem. She is interested in medications to help with her appetite. She is specifically asking about Contrave  However she is on numerous mood altering medications including trazodone , Klonopin, and latuda  Long with pain medication and muscle relaxers Past Medical History  Diagnosis Date  . OCD (obsessive compulsive disorder)   . GERD (gastroesophageal reflux disease)   . Hypertension   . Insomnia   . Fibromyalgia   . Osteopenia   . PVC (premature ventricular contraction)   . Hyperlipidemia   . Psoriatic arthritis ([place])   . Rheumatoid arthritis ([place])   . History of basal cell carcinoma excision     FACE, [place]  . Anxiety and depression   . History of hiatal hernia   . History of thrush   . OSA (obstructive sleep apnea)     MILD AND NO CPAP SINCE SURGERY IN 2007  . PONV (postoperative nausea and vomiting)    Past Surgical History  Procedure Laterality Date  . Bunionectomy  1991  . Cesarean section  1985  . Shoulder surgery Right 1997  . Posterior vitrectomy right eye and laser   10-27-1999  . Eye surgery  1995    rk (laser surgery), semi cornea transplant, detacted retina,  fluid removal  . Knee arthroscopy Left 03-03-2004  . Total knee arthroplasty Left 12-14-2005  . Uvulopalatopharyngoplasty   04-26-2006    w/  TONSILLECTOMY/  TURBINATE REDUCTIONS/  BILATERAL ANTERIOR ETHMOIDECTOMY  . Thoracic fusion  03-13-2013    REMOVAL HARDWARE/  BONE GRAFT FUSION T10//  REVISION OF RODS  . Cervical fusion  March 2013    C5 -- C7  . Breast surgery  1975    lumpectomy-- benign  . Spinal fixation surgery w/ implant  2013 rod #1//  2014  rod 2    S1 -- T10  (rod #1)//   S1 -- T4 (rod #2)  . Cataract extraction w/ intraocular lens  implant, bilateral  2000  . Distal interphalangeal joint fusion Right 03/14/2015    Procedure: RIGHT LONG FINGER DISTAL INTERPHALANGEAL JOINT ARTHRODESIS;  Surgeon: Iran Planas, MD;  Location: [facility];  Service: Orthopedics;  Laterality: Right;   Current Outpatient Prescriptions on File Prior to Visit  Medication Sig Dispense Refill  . alendronate (FOSAMAX) 70 MG tablet 1 TAB ONCE A WEEK IN THE AM WITH 8 OZ OF WATER/EMPTY STOMACH.NO FOOD/DRINK/MEDS DONT LIE DOWN 3O MIN  (THURSDDAY'S IN AM)  0  . baclofen (LIORESAL) 10 MG tablet Take 10 mg by mouth 2 (two) times daily.    . clomiPRAMINE (ANAFRANIL) 50 MG capsule Take 50 mg by mouth 2 (two) times daily.     . clonazePAM (KLONOPIN) 1 MG tablet Take 1 mg by mouth at bedtime.     Marland Kitchen  desloratadine (CLARINEX) 5 MG tablet Take 5 mg by mouth as needed.     Marland Kitchen dexlansoprazole (DEXILANT) 60 MG capsule Take 1 capsule (60 mg total) by mouth daily. 90 capsule 3  . ezetimibe (ZETIA) 10 MG tablet TAKE 1 TABLET (10 MG TOTAL) BY MOUTH DAILY. 90 tablet 3  . furosemide (LASIX) 40 MG tablet TAKE 1 TABLET (40 MG TOTAL) BY MOUTH DAILY. (Patient taking differently: TAKE 1 TABLET (40 MG TOTAL) BY MOUTH DAILY.--  TAKES IN AM) 30 tablet 11  . Golimumab (SIMPONI) 50 MG/0.5ML SOLN Inject 50 mg into the skin every 30 (thirty) days. LAST INJECTION 02-12-2015    . hydrOXYzine (ATARAX/VISTARIL) 50 MG tablet Take 1-2 up to 3 times a day as needed for itching. 30 tablet 1  . KLOR-CON M20 20 MEQ tablet TAKE 1 TABLET (20 MEQ TOTAL) BY  MOUTH DAILY. (Patient taking differently: TAKE 1 TABLET (20 MEQ TOTAL) BY MOUTH DAILY.--  TAKES IN AM) 30 tablet 11  . leflunomide (ARAVA) 20 MG tablet Take 20 mg by mouth every evening.     . methocarbamol (ROBAXIN) 750 MG tablet Take 750 mg by mouth every 8 (eight) hours as needed.     . metoprolol succinate (TOPROL-XL) 50 MG 24 hr tablet TAKE 1 TABLET (50 MG TOTAL) BY MOUTH DAILY. TAKE WITH OR IMMEDIATELY FOLLOWING A MEAL. (Patient taking differently: TAKE 1 TABLET (50 MG TOTAL) BY MOUTH DAILY. TAKE WITH OR IMMEDIATELY FOLLOWING A MEAL.--- TAKES IN AM) 90 tablet 3  . nystatin (MYCOSTATIN) 100000 UNIT/ML suspension Take 5 mLs (500,000 Units total) by mouth 4 (four) times daily. Swish and swallow after meals and at bedtime. (Patient taking differently: Take 5 mLs by mouth 4 (four) times daily as needed. Swish and swallow after meals and at bedtime.) 180 mL 0  . oxyCODONE-acetaminophen (PERCOCET) 5-325 MG per tablet Take 1 tablet by mouth every 4 (four) hours as needed for pain.    . pravastatin (PRAVACHOL) 20 MG tablet TAKE 1 TABLET BY MOUTH DAILY (Patient taking differently: TAKE 1 TABLET BY MOUTH DAILY--- TAKES IN PM) 90 tablet 3  . sucralfate (CARAFATE) 1 G tablet Take 1 tablet (1 g total) by mouth 4 (four) times daily -  with meals and at bedtime. (Patient taking differently: Take 1 g by mouth 3 (three) times daily as needed. PRN) 120 tablet 3  . SUMAtriptan (IMITREX) 50 MG tablet TAKE 1 TABLET BY MOUTH ONCE DAILY AS NEEDED FOR HEADACHES 30 tablet 3  . traZODone (DESYREL) 100 MG tablet Take 200 mg by mouth at bedtime.    . valsartan (DIOVAN) 160 MG tablet TAKE 1 TABLET BY MOUTH DAILY. 90 tablet 3   No current facility-administered medications on file prior to visit.   Allergies  Allergen Reactions  . Latex Rash    And Mouth sores  . Amlodipine Besy-Benazepril Hcl Other (See Comments)    COUGH  . Augmentin [Amoxicillin-Pot Clavulanate] Diarrhea and Nausea And Vomiting  . Bextra [Valdecoxib]  Diarrhea and Nausea And Vomiting    REFLUX  . Lipitor [Atorvastatin Calcium] Other (See Comments)    MYALGIAS  . Sulfa Antibiotics Nausea And Vomiting    CRAMPING  . Zocor [Simvastatin] Other (See Comments)    MYALGIA   Social History   Social History  . Marital Status: Married    Spouse Name: N/A  . Number of Children: N/A  . Years of Education: N/A   Occupational History  . Not on file.   Social History Main Topics  .  Smoking status: Former Smoker -- 0.50 packs/day for 10 years    Types: Cigarettes    Quit date: 03/07/1995  . Smokeless tobacco: Never Used  . Alcohol Use: Yes     Comment: rare  . Drug Use: No  . Sexual Activity: Not on file   Other Topics Concern  . Not on file   Social History Narrative     Review of Systems  All other systems reviewed and are negative.      Objective:   Physical Exam  Constitutional: She appears well-developed and well-nourished.  Neck: No thyromegaly present.  Cardiovascular: Normal rate, regular rhythm and normal heart sounds.   Pulmonary/Chest: Effort normal and breath sounds normal. No respiratory distress. She has no wheezes. She has no rales.  Abdominal: Soft. Bowel sounds are normal. She exhibits no distension. There is no tenderness. There is no rebound.  Musculoskeletal: She exhibits no edema.  Vitals reviewed.         Assessment & Plan:  Vasomotor rhinitis - Plan: ipratropium (ATROVENT) 0.06 % nasal spray  Obesity - Plan: topiramate (TOPAMAX) 25 MG capsule   I will try the patient on Atrovent nasal spray 2 sprays 3 times a day as needed for vasomotor rhinitis. Given her other medications I do not want to start the patient on contrave.   She has previously taken Topamax and done well with this mdication for migaines. I would lke to try this as a means to help control her appetite. Begi Topamax 25 mg by mouth dail and gradually increase to 50 mg by mouth twie a day. Recheck in 3 months to assess for weigh  loss

## 2015-07-08 ENCOUNTER — Ambulatory Visit (INDEPENDENT_AMBULATORY_CARE_PROVIDER_SITE_OTHER): Payer: Medicare Other | Admitting: Physician Assistant

## 2015-07-08 ENCOUNTER — Encounter: Payer: Self-pay | Admitting: Physician Assistant

## 2015-07-08 VITALS — BP 110/80 | HR 68 | Temp 97.7°F | Resp 18 | Wt 189.0 lb

## 2015-07-08 DIAGNOSIS — J0100 Acute maxillary sinusitis, unspecified: Secondary | ICD-10-CM

## 2015-07-08 DIAGNOSIS — H109 Unspecified conjunctivitis: Secondary | ICD-10-CM

## 2015-07-08 MED ORDER — POLYMYXIN B-TRIMETHOPRIM 10000-0.1 UNIT/ML-% OP SOLN: 2.0000 [drp] | OPHTHALMIC | Status: DC

## 2015-07-08 MED ORDER — AZITHROMYCIN 250 MG PO TABS: ORAL_TABLET | ORAL | Status: DC

## 2015-07-08 MED ORDER — PREDNISONE 20 MG PO TABS: ORAL_TABLET | ORAL | Status: DC

## 2015-07-08 NOTE — Progress Notes (Signed)
Patient ID: Janet Peterson MRN: [id number], DOB: 11-Nov-1950, 65 y.o. Date of Encounter: 07/08/2015, 12:15 PM    Chief Complaint:  Chief Complaint  Patient presents with  . c/o sinus infection    left eye draining, swollen     HPI: 65 y.o. year old white female presents with above.   Says that her left eye has been irritated and puffy around it. Getting mucousy drainage from the eye. Stuck together with golden crust in the morning. Her nose and sinuses feel clogged up. She feels tender in her sinuses along both cheek areas. Teeth even feels sensitive and painful. Throat is scratchy but not real sore. Says "this isn't as severe as some of the sinus infections she has had, but it just won't go away." Says it seems that the entire left side of her face is more affected--more swollen/puffy/congested and even feels discomfort down towards her left neck. Has had minimal chest congestion and cough. No known fevers or chills.     Home Meds:   Outpatient Prescriptions Prior to Visit  Medication Sig Dispense Refill  . alendronate (FOSAMAX) 70 MG tablet 1 TAB ONCE A WEEK IN THE AM WITH 8 OZ OF WATER/EMPTY STOMACH.NO FOOD/DRINK/MEDS DONT LIE DOWN 3O MIN  (THURSDDAY'S IN AM)  0  . baclofen (LIORESAL) 10 MG tablet Take 10 mg by mouth 2 (two) times daily. Reported on 07/08/2015    . clomiPRAMINE (ANAFRANIL) 50 MG capsule Take 50 mg by mouth 2 (two) times daily.     Marland Kitchen desloratadine (CLARINEX) 5 MG tablet Take 5 mg by mouth as needed.     Marland Kitchen dexlansoprazole (DEXILANT) 60 MG capsule Take 1 capsule (60 mg total) by mouth daily. 90 capsule 3  . ezetimibe (ZETIA) 10 MG tablet TAKE 1 TABLET (10 MG TOTAL) BY MOUTH DAILY. 90 tablet 3  . furosemide (LASIX) 40 MG tablet TAKE 1 TABLET (40 MG TOTAL) BY MOUTH DAILY. (Patient taking differently: TAKE 1 TABLET (40 MG TOTAL) BY MOUTH DAILY.--  TAKES IN AM) 30 tablet 11  . hydrOXYzine (ATARAX/VISTARIL) 50 MG tablet Take 1-2 up to 3 times a day as needed for itching. 30  tablet 1  . ipratropium (ATROVENT) 0.06 % nasal spray Place 2 sprays into both nostrils 3 (three) times daily. 15 mL 1  . KLOR-CON M20 20 MEQ tablet TAKE 1 TABLET (20 MEQ TOTAL) BY MOUTH DAILY. (Patient taking differently: TAKE 1 TABLET (20 MEQ TOTAL) BY MOUTH DAILY.--  TAKES IN AM) 30 tablet 11  . leflunomide (ARAVA) 20 MG tablet Take 20 mg by mouth every evening.     . lurasidone (LATUDA) 20 MG TABS tablet Take 20 mg by mouth daily.    . metoprolol succinate (TOPROL-XL) 50 MG 24 hr tablet TAKE 1 TABLET (50 MG TOTAL) BY MOUTH DAILY. TAKE WITH OR IMMEDIATELY FOLLOWING A MEAL. (Patient taking differently: TAKE 1 TABLET (50 MG TOTAL) BY MOUTH DAILY. TAKE WITH OR IMMEDIATELY FOLLOWING A MEAL.--- TAKES IN AM) 90 tablet 3  . oxyCODONE-acetaminophen (PERCOCET) 5-325 MG per tablet Take 1 tablet by mouth every 4 (four) hours as needed for pain.    . pravastatin (PRAVACHOL) 20 MG tablet TAKE 1 TABLET BY MOUTH DAILY (Patient taking differently: TAKE 1 TABLET BY MOUTH DAILY--- TAKES IN PM) 90 tablet 3  . sucralfate (CARAFATE) 1 G tablet Take 1 tablet (1 g total) by mouth 4 (four) times daily -  with meals and at bedtime. (Patient taking differently: Take 1 g by mouth  3 (three) times daily as needed. PRN) 120 tablet 3  . SUMAtriptan (IMITREX) 50 MG tablet TAKE 1 TABLET BY MOUTH ONCE DAILY AS NEEDED FOR HEADACHES 30 tablet 3  . topiramate (TOPAMAX) 25 MG capsule Take 2 capsules (50 mg total) by mouth 2 (two) times daily. 120 capsule 1  . traZODone (DESYREL) 100 MG tablet Take 200 mg by mouth at bedtime.    . valsartan (DIOVAN) 160 MG tablet TAKE 1 TABLET BY MOUTH DAILY. 90 tablet 3  . clonazePAM (KLONOPIN) 1 MG tablet Take 1 mg by mouth at bedtime. Reported on 07/08/2015    . Golimumab (SIMPONI) 50 MG/0.5ML SOLN Inject 50 mg into the skin every 30 (thirty) days. Reported on 07/08/2015    . methocarbamol (ROBAXIN) 750 MG tablet Take 750 mg by mouth every 8 (eight) hours as needed. Reported on 07/08/2015    . nystatin  (MYCOSTATIN) 100000 UNIT/ML suspension Take 5 mLs (500,000 Units total) by mouth 4 (four) times daily. Swish and swallow after meals and at bedtime. (Patient not taking: Reported on 07/08/2015) 180 mL 0   No facility-administered medications prior to visit.    Allergies:  Allergies  Allergen Reactions  . Latex Rash    And Mouth sores  . Amlodipine Besy-Benazepril Hcl Other (See Comments)    COUGH  . Augmentin [Amoxicillin-Pot Clavulanate] Diarrhea and Nausea And Vomiting  . Bextra [Valdecoxib] Diarrhea and Nausea And Vomiting    REFLUX  . Lipitor [Atorvastatin Calcium] Other (See Comments)    MYALGIAS  . Sulfa Antibiotics Nausea And Vomiting    CRAMPING  . Zocor [Simvastatin] Other (See Comments)    MYALGIA      Review of Systems: See HPI for pertinent ROS. All other ROS negative.    Physical Exam: Blood pressure 110/80, pulse 68, temperature 97.7 F (36.5 C), temperature source Oral, resp. rate 18, weight 189 lb (85.73 kg)., Body mass index is 32.43 kg/(m^2). General:  WNWD WF. Appears in no acute distress. HEENT: Normocephalic, atraumatic, sclera non-icteric. Currently there is no mucus in the eyes. Left ey with very minimal conjuctival injection. No rash on the face, no rash on nose. On inspection, slight puffiness under left eye and to left maxillary sinus compared to the right.  Bilateral auditory canals clear, TM's are without perforation, pearly grey and translucent with reflective cone of light bilaterally. Oral cavity moist, posterior pharynx without exudate, erythema, peritonsillar abscess. Positive tenderness with percussion to bilateral maxillary sinuses. No tenderness with percussion to frontal sinuses. Neck: Supple. No thyromegaly. No lymphadenopathy. Lungs: Clear bilaterally to auscultation without wheezes, rales, or rhonchi. Breathing is unlabored. Heart: Regular rhythm. No murmurs, rubs, or gallops. Msk:  Strength and tone normal for age. Extremities/Skin: Warm and  dry.  No rashes. Neuro: Alert and oriented X 3. Moves all extremities spontaneously. Gait is normal. CNII-XII grossly in tact. Psych:  Responds to questions appropriately with a normal affect.     ASSESSMENT AND PLAN:  65 y.o. year old female with  1. Acute maxillary sinusitis, recurrence not specified She is allergic to Augmentin and sulfa. She is to take antibiotic and prednisone as directed. Follow-up if symptoms do not resolve upon completion of these medications. - azithromycin (ZITHROMAX) 250 MG tablet; Day 1: Take 2 daily. Days 2-5: Take 1 daily.  Dispense: 6 tablet; Refill: 0 - predniSONE (DELTASONE) 20 MG tablet; Take 3 daily for 2 days, then 2 daily for 2 days, then 1 daily for 2 days.  Dispense: 12 tablet; Refill: 0  2. Conjunctivitis of left eye She has sulfa allergy so will not use Bleph-10. She is to use these drops as directed. Discussed proper hygiene. Follow-up if symptoms worsen/change or not controlled with these drops over the next couple of days. - trimethoprim-polymyxin b (POLYTRIM) ophthalmic solution; Place 2 drops into the left eye every 4 (four) hours.  Dispense: 10 mL; Refill: 0   Signed, [address] [place], [place], [facility] 07/08/2015 12:15 PM

## 2015-07-10 ENCOUNTER — Encounter: Payer: Self-pay | Admitting: Rheumatology

## 2015-07-10 DIAGNOSIS — Z9225 Personal history of immunosupression therapy: Secondary | ICD-10-CM | POA: Diagnosis not present

## 2015-07-10 DIAGNOSIS — M85851 Other specified disorders of bone density and structure, right thigh: Secondary | ICD-10-CM | POA: Diagnosis not present

## 2015-07-16 ENCOUNTER — Encounter: Payer: Self-pay | Admitting: Family Medicine

## 2015-07-16 ENCOUNTER — Ambulatory Visit (INDEPENDENT_AMBULATORY_CARE_PROVIDER_SITE_OTHER): Payer: Medicare Other | Admitting: Family Medicine

## 2015-07-16 VITALS — BP 110/70 | HR 68 | Temp 97.9°F | Resp 18 | Ht 64.0 in | Wt 188.0 lb

## 2015-07-16 DIAGNOSIS — Z4789 Encounter for other orthopedic aftercare: Secondary | ICD-10-CM | POA: Diagnosis not present

## 2015-07-16 DIAGNOSIS — G8929 Other chronic pain: Secondary | ICD-10-CM | POA: Diagnosis not present

## 2015-07-16 DIAGNOSIS — M19041 Primary osteoarthritis, right hand: Secondary | ICD-10-CM | POA: Diagnosis not present

## 2015-07-16 DIAGNOSIS — M549 Dorsalgia, unspecified: Secondary | ICD-10-CM

## 2015-07-16 MED ORDER — CEFDINIR 300 MG PO CAPS: 300.0000 mg | ORAL_CAPSULE | Freq: Two times a day (BID) | ORAL | Status: DC

## 2015-07-16 MED ORDER — OXYCODONE-ACETAMINOPHEN 5-325 MG PO TABS: 1.0000 | ORAL_TABLET | ORAL | Status: DC | PRN

## 2015-07-16 NOTE — Progress Notes (Signed)
Subjective:    Patient ID: Janet Peterson, female    DOB: 16-Mar-1951, 65 y.o.   MRN: [id number]  HPI 06/20/15 Patient reports several months of rhinorrhea. The rhinorrhea occurs only when she is eating. There are no specific foods that seem to trigger it. However it is causing her a significant impairment in her quality of life because she is constantly having to blow her nose at the table or wipe her nose. 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 She has tried allergy medication with no benefit. She has not tried [facility]. She denies any fevers or chills or sinus pain. She would also like assistance with weight loss. She is trying different diets include Weight Watchers and Nutrisystem. She is interested in medications to help with her appetite. She is specifically asking about Contrave  However she is on numerous mood altering medications including trazodone, Klonopin, and latuda  Long with pain medication and muscle relaxers.  At that time, my plan was:  I will try the patient on Atrovent nasal spray 2 sprays 3 times a day as needed for vasomotor rhinitis. Given her other medications I do not want to start the patient on contrave.   She has previously taken Topamax and done well with this mdication for migaines. I would lke to try this as a means to help control her appetite. Begin Topamax 25 mg by mouth daily and gradually increase to 50 mg by mouth twice a day. Recheck in 3 months to assess for weight loss  07/16/15 Wt Readings from Last 3 Encounters:  07/16/15 188 lb (85.276 kg)  07/08/15 189 lb (85.73 kg)  06/20/15 187 lb (84.823 kg)   patient has not seen any weight loss since starting Topamax however she was placed on prednisone recently by another provider for a sinus infection. Her symptoms have improved but not completely resolved. She continues to complain of pain and pressure in her left maxillary sinus along with persistent rhinorrhea and head congestion. She also reports subjective  fevers and pressure behind her left eye. Her primary concern is pain control. She has a history of 4 back surgeries under the care of Dr. Patrice Paradise.   She has undergone numerous fusions of the thoracic, cervical, and lumbar spine. She also suffers from psoriatic arthritis and ankylosing spondylitis. All these factors contribute to chronic low mid back pain. Particularly on days when she does a lot of housework or she is on her feet for prolonged periods of time she will develop a sharp severe pain in the middle and lower portion of her back that keeps her from sleeping. At the present time she takes 5-10 mg of oxicodone at night to help her sleep.   She is asking if I'll prescribe this long-term so that she does not have to see his many specialist. She is not a candidate for any further surgery and she is not interested in any further surgery. 60 tablets of Percocet were therefore last her at least one month  Past Medical History  Diagnosis Date  . OCD (obsessive compulsive disorder)   . GERD (gastroesophageal reflux disease)   . Hypertension   . Insomnia   . Fibromyalgia   . Osteopenia   . PVC (premature ventricular contraction)   . Hyperlipidemia   . Psoriatic arthritis ([place])   . Rheumatoid arthritis ([place])   . History of basal cell carcinoma excision     FACE, [place]  . Anxiety and depression   .  History of hiatal hernia   . History of thrush   . OSA (obstructive sleep apnea)     MILD AND NO CPAP SINCE SURGERY IN 2007  . PONV (postoperative nausea and vomiting)    Past Surgical History  Procedure Laterality Date  . Bunionectomy  1991  . Cesarean section  1985  . Shoulder surgery Right 1997  . Posterior vitrectomy right eye and laser   10-27-1999  . Eye surgery  1995    rk (laser surgery), semi cornea transplant, detacted retina,  fluid removal  . Knee arthroscopy Left 03-03-2004  . Total knee arthroplasty Left 12-14-2005  . Uvulopalatopharyngoplasty  04-26-2006    w/   TONSILLECTOMY/  TURBINATE REDUCTIONS/  BILATERAL ANTERIOR ETHMOIDECTOMY  . Thoracic fusion  03-13-2013    REMOVAL HARDWARE/  BONE GRAFT FUSION T10//  REVISION OF RODS  . Cervical fusion  March 2013    C5 -- C7  . Breast surgery  1975    lumpectomy-- benign  . Spinal fixation surgery w/ implant  2013 rod #1//  2014  rod 2    S1 -- T10  (rod #1)//   S1 -- T4 (rod #2)  . Cataract extraction w/ intraocular lens  implant, bilateral  2000  . Distal interphalangeal joint fusion Right 03/14/2015    Procedure: RIGHT LONG FINGER DISTAL INTERPHALANGEAL JOINT ARTHRODESIS;  Surgeon: Iran Planas, MD;  Location: [facility];  Service: Orthopedics;  Laterality: Right;   Current Outpatient Prescriptions on File Prior to Visit  Medication Sig Dispense Refill  . alendronate (FOSAMAX) 70 MG tablet 1 TAB ONCE A WEEK IN THE AM WITH 8 OZ OF WATER/EMPTY STOMACH.NO FOOD/DRINK/MEDS DONT LIE DOWN 3O MIN  (THURSDDAY'S IN AM)  0  . azithromycin (ZITHROMAX) 250 MG tablet Day 1: Take 2 daily. Days 2-5: Take 1 daily. 6 tablet 0  . baclofen (LIORESAL) 10 MG tablet Take 10 mg by mouth 2 (two) times daily. Reported on 07/08/2015    . clomiPRAMINE (ANAFRANIL) 50 MG capsule Take 50 mg by mouth 2 (two) times daily.     . clonazePAM (KLONOPIN) 1 MG tablet Take 1 mg by mouth at bedtime. Reported on 07/08/2015    . desloratadine (CLARINEX) 5 MG tablet Take 5 mg by mouth as needed.     Marland Kitchen dexlansoprazole (DEXILANT) 60 MG capsule Take 1 capsule (60 mg total) by mouth daily. 90 capsule 3  . ezetimibe (ZETIA) 10 MG tablet TAKE 1 TABLET (10 MG TOTAL) BY MOUTH DAILY. 90 tablet 3  . furosemide (LASIX) 40 MG tablet TAKE 1 TABLET (40 MG TOTAL) BY MOUTH DAILY. (Patient taking differently: TAKE 1 TABLET (40 MG TOTAL) BY MOUTH DAILY.--  TAKES IN AM) 30 tablet 11  . Golimumab (SIMPONI) 50 MG/0.5ML SOLN Inject 50 mg into the skin every 30 (thirty) days. Reported on 07/08/2015    . hydrOXYzine (ATARAX/VISTARIL) 50 MG tablet Take 1-2  up to 3 times a day as needed for itching. 30 tablet 1  . ipratropium (ATROVENT) 0.06 % nasal spray Place 2 sprays into both nostrils 3 (three) times daily. 15 mL 1  . KLOR-CON M20 20 MEQ tablet TAKE 1 TABLET (20 MEQ TOTAL) BY MOUTH DAILY. (Patient taking differently: TAKE 1 TABLET (20 MEQ TOTAL) BY MOUTH DAILY.--  TAKES IN AM) 30 tablet 11  . leflunomide (ARAVA) 20 MG tablet Take 20 mg by mouth every evening.     . lurasidone (LATUDA) 20 MG TABS tablet Take 20 mg by mouth daily.    Marland Kitchen  methocarbamol (ROBAXIN) 750 MG tablet Take 750 mg by mouth every 8 (eight) hours as needed. Reported on 07/08/2015    . metoprolol succinate (TOPROL-XL) 50 MG 24 hr tablet TAKE 1 TABLET (50 MG TOTAL) BY MOUTH DAILY. TAKE WITH OR IMMEDIATELY FOLLOWING A MEAL. (Patient taking differently: TAKE 1 TABLET (50 MG TOTAL) BY MOUTH DAILY. TAKE WITH OR IMMEDIATELY FOLLOWING A MEAL.--- TAKES IN AM) 90 tablet 3  . nystatin (MYCOSTATIN) 100000 UNIT/ML suspension Take 5 mLs (500,000 Units total) by mouth 4 (four) times daily. Swish and swallow after meals and at bedtime. (Patient not taking: Reported on 07/08/2015) 180 mL 0  . oxyCODONE-acetaminophen (PERCOCET) 5-325 MG per tablet Take 1 tablet by mouth every 4 (four) hours as needed for pain.    . pravastatin (PRAVACHOL) 20 MG tablet TAKE 1 TABLET BY MOUTH DAILY (Patient taking differently: TAKE 1 TABLET BY MOUTH DAILY--- TAKES IN PM) 90 tablet 3  . predniSONE (DELTASONE) 20 MG tablet Take 3 daily for 2 days, then 2 daily for 2 days, then 1 daily for 2 days. 12 tablet 0  . sucralfate (CARAFATE) 1 G tablet Take 1 tablet (1 g total) by mouth 4 (four) times daily -  with meals and at bedtime. (Patient taking differently: Take 1 g by mouth 3 (three) times daily as needed. PRN) 120 tablet 3  . SUMAtriptan (IMITREX) 50 MG tablet TAKE 1 TABLET BY MOUTH ONCE DAILY AS NEEDED FOR HEADACHES 30 tablet 3  . topiramate (TOPAMAX) 25 MG capsule Take 2 capsules (50 mg total) by mouth 2 (two) times daily.  120 capsule 1  . traZODone (DESYREL) 100 MG tablet Take 200 mg by mouth at bedtime.    Marland Kitchen trimethoprim-polymyxin b (POLYTRIM) ophthalmic solution Place 2 drops into the left eye every 4 (four) hours. 10 mL 0  . valsartan (DIOVAN) 160 MG tablet TAKE 1 TABLET BY MOUTH DAILY. 90 tablet 3   No current facility-administered medications on file prior to visit.   Allergies  Allergen Reactions  . Latex Rash    And Mouth sores  . Amlodipine Besy-Benazepril Hcl Other (See Comments)    COUGH  . Augmentin [Amoxicillin-Pot Clavulanate] Diarrhea and Nausea And Vomiting  . Bextra [Valdecoxib] Diarrhea and Nausea And Vomiting    REFLUX  . Lipitor [Atorvastatin Calcium] Other (See Comments)    MYALGIAS  . Sulfa Antibiotics Nausea And Vomiting    CRAMPING  . Zocor [Simvastatin] Other (See Comments)    MYALGIA   Social History   Social History  . Marital Status: Married    Spouse Name: N/A  . Number of Children: N/A  . Years of Education: N/A   Occupational History  . Not on file.   Social History Main Topics  . Smoking status: Former Smoker -- 0.50 packs/day for 10 years    Types: Cigarettes    Quit date: 03/07/1995  . Smokeless tobacco: Never Used  . Alcohol Use: Yes     Comment: rare  . Drug Use: No  . Sexual Activity: Not on file   Other Topics Concern  . Not on file   Social History Narrative     Review of Systems  All other systems reviewed and are negative.      Objective:   Physical Exam  Constitutional: She appears well-developed and well-nourished.  HENT:  Nose: Mucosal edema and rhinorrhea present. Left sinus exhibits maxillary sinus tenderness.  Neck: No thyromegaly present.  Cardiovascular: Normal rate, regular rhythm and normal heart sounds.  Pulmonary/Chest: Effort normal and breath sounds normal. No respiratory distress. She has no wheezes. She has no rales.  Abdominal: Soft. Bowel sounds are normal. She exhibits no distension. There is no tenderness.  There is no rebound.  Musculoskeletal: She exhibits no edema.       Cervical back: She exhibits decreased range of motion, tenderness and bony tenderness.       Thoracic back: She exhibits decreased range of motion and tenderness.       Lumbar back: She exhibits decreased range of motion, tenderness and pain.  Vitals reviewed.         Assessment & Plan:  Chronic back pain - Plan: oxyCODONE-acetaminophen (PERCOCET) 5-325 MG tablet, cefdinir (OMNICEF) 300 MG capsule  Rhinosinusitis   I will treat her sinus infection with Omnicef 300 mg by mouth twice a day for 10 days. I agreed to prescribe the patient Percocet 5/325 one to 2 tablets every night to help her sleep. I will give her 60 tablets per month. This should last at least 30 days and possibly longer. We discussed a pain contract. She is to receive pain medication from no other physician and I may periodically drug screen her to ensure compliance. That being said I have no concern about this patient abusing or diverting medication.   I recommended she give the Topamax a longer trial. She's only been on the medication for one month and half of that she was taking prednisone. Recheck in 3 months

## 2015-07-22 DIAGNOSIS — L408 Other psoriasis: Secondary | ICD-10-CM | POA: Diagnosis not present

## 2015-07-22 DIAGNOSIS — M79641 Pain in right hand: Secondary | ICD-10-CM | POA: Diagnosis not present

## 2015-07-22 DIAGNOSIS — Z9225 Personal history of immunosupression therapy: Secondary | ICD-10-CM | POA: Diagnosis not present

## 2015-07-22 DIAGNOSIS — Z09 Encounter for follow-up examination after completed treatment for conditions other than malignant neoplasm: Secondary | ICD-10-CM | POA: Diagnosis not present

## 2015-07-22 DIAGNOSIS — M542 Cervicalgia: Secondary | ICD-10-CM | POA: Diagnosis not present

## 2015-08-26 ENCOUNTER — Telehealth: Payer: Self-pay | Admitting: *Deleted

## 2015-08-26 DIAGNOSIS — G8929 Other chronic pain: Secondary | ICD-10-CM

## 2015-08-26 DIAGNOSIS — M549 Dorsalgia, unspecified: Principal | ICD-10-CM

## 2015-08-26 MED ORDER — OXYCODONE-ACETAMINOPHEN 5-325 MG PO TABS: 1.0000 | ORAL_TABLET | ORAL | Status: DC | PRN

## 2015-08-26 NOTE — Telephone Encounter (Signed)
Pt calling needing refills on oxyCODONE-acetaminophen (PERCOCET) 5-325 MG tablet   Last rf:07/16/15  Last ov:07/16/15

## 2015-08-26 NOTE — Telephone Encounter (Signed)
ok 

## 2015-08-26 NOTE — Telephone Encounter (Signed)
Script printed ready for provider signature 

## 2015-09-10 DIAGNOSIS — F329 Major depressive disorder, single episode, unspecified: Secondary | ICD-10-CM | POA: Diagnosis not present

## 2015-09-24 ENCOUNTER — Encounter: Payer: Self-pay | Admitting: Family Medicine

## 2015-09-24 ENCOUNTER — Ambulatory Visit (INDEPENDENT_AMBULATORY_CARE_PROVIDER_SITE_OTHER): Payer: Medicare Other | Admitting: Family Medicine

## 2015-09-24 VITALS — BP 100/64 | HR 68 | Temp 98.3°F | Resp 16 | Ht 64.0 in | Wt 187.0 lb

## 2015-09-24 DIAGNOSIS — M549 Dorsalgia, unspecified: Secondary | ICD-10-CM

## 2015-09-24 DIAGNOSIS — I10 Essential (primary) hypertension: Secondary | ICD-10-CM | POA: Diagnosis not present

## 2015-09-24 MED ORDER — PHENTERMINE HCL 37.5 MG PO CAPS: 37.5000 mg | ORAL_CAPSULE | ORAL | Status: DC

## 2015-09-24 NOTE — Progress Notes (Signed)
Subjective:    Patient ID: Janet Peterson, female    DOB: Oct 06, 1950, 65 y.o.   MRN: [id number]  HPI Patient has a history of rheumatoid arthritis and psoriatic arthritis. She is under the care of a rheumatologist. She has failed numerous agents including methotrexate, and Humira. She is currently on Simponi.  She also has a history of spinal fusion with rod placement from S1-T10 and a second rod placement from S1-T4. In 2016 she underwent operative removal of hardware from [id number] due to complications in this area. She continues to have sharp severe pain at the level of T4 in the center of her back. This occurs on a daily basis. It tends to be worse with standing for prolonged periods of time such as vacuuming and doing dishes. At times the pain will radiate up her spine into her neck in a burning neuropathic nature.  She has tried and failed gabapentin, Lyrica, Topamax for neuropathic pain. She is also tried Cymbalta for pain secondary to fibromyalgia with no improvement. Per her report, Dr. Patrice Paradise performed an epidural steroid injection around the level of T4 and the patient experienced temporary relief although the pain has returned. She is here for other options for pain management. She does use narcotic pain medication but she only takes 1 pain pill per day. She is very leery of taking more pain medication and would like to avoid it altogether. She is interested in other options for pain management such as nerve ablations or repeated steroid injections. She also mentions possibly getting a second opinion from a neurosurgeon. She also asked for assistance with weight loss. We tried Topamax as an appetite suppressant but she saw no benefit. She took 50 mg twice daily with no loss of weight. Furthermore he did not help at all her neuropathic pain in the center of her back. She is unable to afford contrave or belviq.   Past Medical History  Diagnosis Date  . OCD (obsessive compulsive disorder)   . GERD  (gastroesophageal reflux disease)   . Hypertension   . Insomnia   . Fibromyalgia   . Osteopenia   . PVC (premature ventricular contraction)   . Hyperlipidemia   . Psoriatic arthritis ([place])   . Rheumatoid arthritis ([place])   . History of basal cell carcinoma excision     FACE, [place]  . Anxiety and depression   . History of hiatal hernia   . History of thrush   . OSA (obstructive sleep apnea)     MILD AND NO CPAP SINCE SURGERY IN 2007  . PONV (postoperative nausea and vomiting)   . Ankylosing spondylitis [facility])    Past Surgical History  Procedure Laterality Date  . Bunionectomy  1991  . Cesarean section  1985  . Shoulder surgery Right 1997  . Posterior vitrectomy right eye and laser   10-27-1999  . Eye surgery  1995    rk (laser surgery), semi cornea transplant, detacted retina,  fluid removal  . Knee arthroscopy Left 03-03-2004  . Total knee arthroplasty Left 12-14-2005  . Uvulopalatopharyngoplasty  04-26-2006    w/  TONSILLECTOMY/  TURBINATE REDUCTIONS/  BILATERAL ANTERIOR ETHMOIDECTOMY  . Thoracic fusion  03-13-2013    REMOVAL HARDWARE/  BONE GRAFT FUSION T10//  REVISION OF RODS  . Cervical fusion  March 2013    C5 -- C7  . Breast surgery  1975    lumpectomy-- benign  . Spinal fixation surgery w/ implant  2013 rod #1//  2014  rod 2    S1 -- T10  (rod #1)//   S1 -- T4 (rod #2)  . Cataract extraction w/ intraocular lens  implant, bilateral  2000  . Distal interphalangeal joint fusion Right 03/14/2015    Procedure: RIGHT LONG FINGER DISTAL INTERPHALANGEAL JOINT ARTHRODESIS;  Surgeon: Iran Planas, MD;  Location: [facility];  Service: Orthopedics;  Laterality: Right;   Current Outpatient Prescriptions on File Prior to Visit  Medication Sig Dispense Refill  . alendronate (FOSAMAX) 70 MG tablet 1 TAB ONCE A WEEK IN THE AM WITH 8 OZ OF WATER/EMPTY STOMACH.NO FOOD/DRINK/MEDS DONT LIE DOWN 3O MIN  (THURSDDAY'S IN AM)  0  . baclofen (LIORESAL) 10 MG tablet  Take 10 mg by mouth 2 (two) times daily. Reported on 07/08/2015    . clomiPRAMINE (ANAFRANIL) 50 MG capsule Take 50 mg by mouth 2 (two) times daily.     Marland Kitchen desloratadine (CLARINEX) 5 MG tablet Take 5 mg by mouth as needed.     Marland Kitchen dexlansoprazole (DEXILANT) 60 MG capsule Take 1 capsule (60 mg total) by mouth daily. 90 capsule 3  . ezetimibe (ZETIA) 10 MG tablet TAKE 1 TABLET (10 MG TOTAL) BY MOUTH DAILY. 90 tablet 3  . furosemide (LASIX) 40 MG tablet TAKE 1 TABLET (40 MG TOTAL) BY MOUTH DAILY. (Patient taking differently: TAKE 1 TABLET (40 MG TOTAL) BY MOUTH DAILY.--  TAKES IN AM) 30 tablet 11  . Golimumab (SIMPONI) 50 MG/0.5ML SOLN Inject 50 mg into the skin every 30 (thirty) days. Reported on 07/08/2015    . hydrOXYzine (ATARAX/VISTARIL) 50 MG tablet Take 1-2 up to 3 times a day as needed for itching. 30 tablet 1  . ipratropium (ATROVENT) 0.06 % nasal spray Place 2 sprays into both nostrils 3 (three) times daily. 15 mL 1  . KLOR-CON M20 20 MEQ tablet TAKE 1 TABLET (20 MEQ TOTAL) BY MOUTH DAILY. (Patient taking differently: TAKE 1 TABLET (20 MEQ TOTAL) BY MOUTH DAILY.--  TAKES IN AM) 30 tablet 11  . leflunomide (ARAVA) 20 MG tablet Take 20 mg by mouth every evening.     . lurasidone (LATUDA) 20 MG TABS tablet Take 20 mg by mouth daily.    . metoprolol succinate (TOPROL-XL) 50 MG 24 hr tablet TAKE 1 TABLET (50 MG TOTAL) BY MOUTH DAILY. TAKE WITH OR IMMEDIATELY FOLLOWING A MEAL. (Patient taking differently: TAKE 1 TABLET (50 MG TOTAL) BY MOUTH DAILY. TAKE WITH OR IMMEDIATELY FOLLOWING A MEAL.--- TAKES IN AM) 90 tablet 3  . oxyCODONE-acetaminophen (PERCOCET) 5-325 MG tablet Take 1 tablet by mouth every 4 (four) hours as needed. 60 tablet 0  . pravastatin (PRAVACHOL) 20 MG tablet TAKE 1 TABLET BY MOUTH DAILY (Patient taking differently: TAKE 1 TABLET BY MOUTH DAILY--- TAKES IN PM) 90 tablet 3  . sucralfate (CARAFATE) 1 G tablet Take 1 tablet (1 g total) by mouth 4 (four) times daily -  with meals and at  bedtime. (Patient taking differently: Take 1 g by mouth 3 (three) times daily as needed. PRN) 120 tablet 3  . SUMAtriptan (IMITREX) 50 MG tablet TAKE 1 TABLET BY MOUTH ONCE DAILY AS NEEDED FOR HEADACHES 30 tablet 3  . traZODone (DESYREL) 100 MG tablet Take 200 mg by mouth at bedtime.    Marland Kitchen trimethoprim-polymyxin b (POLYTRIM) ophthalmic solution Place 2 drops into the left eye every 4 (four) hours. 10 mL 0  . valsartan (DIOVAN) 160 MG tablet TAKE 1 TABLET BY MOUTH DAILY. 90 tablet 3  No current facility-administered medications on file prior to visit.   Allergies  Allergen Reactions  . Latex Rash    And Mouth sores  . Amlodipine Besy-Benazepril Hcl Other (See Comments)    COUGH  . Augmentin [Amoxicillin-Pot Clavulanate] Diarrhea and Nausea And Vomiting  . Bextra [Valdecoxib] Diarrhea and Nausea And Vomiting    REFLUX  . Lipitor [Atorvastatin Calcium] Other (See Comments)    MYALGIAS  . Sulfa Antibiotics Nausea And Vomiting    CRAMPING  . Zocor [Simvastatin] Other (See Comments)    MYALGIA   Social History   Social History  . Marital Status: Married    Spouse Name: N/A  . Number of Children: N/A  . Years of Education: N/A   Occupational History  . Not on file.   Social History Main Topics  . Smoking status: Former Smoker -- 0.50 packs/day for 10 years    Types: Cigarettes    Quit date: 03/07/1995  . Smokeless tobacco: Never Used  . Alcohol Use: Yes     Comment: rare  . Drug Use: No  . Sexual Activity: Not on file   Other Topics Concern  . Not on file   Social History Narrative      Review of Systems  All other systems reviewed and are negative.      Objective:   Physical Exam  Cardiovascular: Normal rate, regular rhythm and normal heart sounds.   Pulmonary/Chest: Effort normal and breath sounds normal. No respiratory distress. She has no wheezes. She has no rales.  Abdominal: Soft. Bowel sounds are normal. She exhibits no distension. There is no tenderness.  There is no rebound and no guarding.  Musculoskeletal: She exhibits tenderness.       Cervical back: She exhibits decreased range of motion.       Thoracic back: She exhibits decreased range of motion, tenderness, bony tenderness, deformity and pain. She exhibits no spasm.       Lumbar back: She exhibits decreased range of motion and tenderness.  Vitals reviewed.         Assessment & Plan:  Mid back pain - Plan: DG Thoracic Spine W/Swimmers, Ambulatory referral to Pain Clinic  Benign essential HTN - Plan: CBC with Differential/Platelet, COMPLETE METABOLIC PANEL WITH GFR, Lipid panel  : Repeat an x-ray of her thoracic spine given her chronic immunosuppression to rule out a vertebral fracture given the sudden worsening of the pain in the middle of her thoracic spine however I doubt that is a possibility. We discussed trying high-dose Lyrica combined with Cymbalta. She does not want to increase narcotic pain medication. She is interested in seeing a pain clinic to discuss possible spinal cord stimulators versus epidural steroid injections versus nerve ablations if possible. Given her significant past medical and surgical history in that area I'm not sure what options they may have for her but I do believe is appropriate simply to have her talk with him to discuss what options exist. Her blood pressures well controlled on her like her to return fasting for a CBC, CMP, fasting lipid panel. Meanwhile we will try 3 months of Adipex 37.5 mg by mouth every morning persistent weight loss. I warned the patient that if she experienced any palpitations or rapid heartbeat or chest pain or shortness of breath to discontinue the medication immediately.

## 2015-09-25 ENCOUNTER — Ambulatory Visit
Admission: RE | Admit: 2015-09-25 | Discharge: 2015-09-25 | Disposition: A | Discharge status: Home / Self Care | Payer: Medicare Other | Source: Ambulatory Visit | Attending: Family Medicine | Admitting: Family Medicine

## 2015-09-25 DIAGNOSIS — Z79899 Other long term (current) drug therapy: Secondary | ICD-10-CM | POA: Diagnosis not present

## 2015-09-25 DIAGNOSIS — M549 Dorsalgia, unspecified: Secondary | ICD-10-CM

## 2015-09-25 DIAGNOSIS — M546 Pain in thoracic spine: Secondary | ICD-10-CM | POA: Diagnosis not present

## 2015-10-10 DIAGNOSIS — F329 Major depressive disorder, single episode, unspecified: Secondary | ICD-10-CM | POA: Diagnosis not present

## 2015-10-15 ENCOUNTER — Telehealth: Payer: Self-pay | Admitting: Family Medicine

## 2015-10-15 DIAGNOSIS — G8929 Other chronic pain: Secondary | ICD-10-CM

## 2015-10-15 DIAGNOSIS — M549 Dorsalgia, unspecified: Principal | ICD-10-CM

## 2015-10-15 NOTE — Telephone Encounter (Signed)
Pt is requesting a refill of Oxycodone 5-325  [phone]

## 2015-10-15 NOTE — Telephone Encounter (Signed)
ok 

## 2015-10-15 NOTE — Telephone Encounter (Signed)
Ok to refill 

## 2015-10-16 MED ORDER — OXYCODONE-ACETAMINOPHEN 5-325 MG PO TABS: 1.0000 | ORAL_TABLET | ORAL | Status: DC | PRN

## 2015-10-16 NOTE — Telephone Encounter (Signed)
LMTRC

## 2015-10-17 ENCOUNTER — Encounter: Payer: Self-pay | Admitting: Family Medicine

## 2015-10-17 NOTE — Telephone Encounter (Signed)
This encounter was created in error - please disregard.

## 2015-10-17 NOTE — Telephone Encounter (Signed)
rx printed and left up front

## 2015-10-17 NOTE — Telephone Encounter (Signed)
Pt aware to p/u 

## 2015-10-17 NOTE — Telephone Encounter (Signed)
Patient calling for rx of her oxycodone  [phone]

## 2015-10-22 ENCOUNTER — Other Ambulatory Visit: Payer: Medicare Other

## 2015-10-22 ENCOUNTER — Other Ambulatory Visit: Payer: Self-pay | Admitting: Family Medicine

## 2015-10-22 DIAGNOSIS — Z79899 Other long term (current) drug therapy: Secondary | ICD-10-CM | POA: Diagnosis not present

## 2015-10-22 LAB — COMPREHENSIVE METABOLIC PANEL
ALT: 9 U/L (ref 6–29)
AST: 16 U/L (ref 10–35)
Albumin: 4.3 g/dL (ref 3.6–5.1)
Alkaline Phosphatase: 88 U/L (ref 33–130)
BUN: 9 mg/dL (ref 7–25)
CO2: 25 mmol/L (ref 20–31)
Calcium: 9.6 mg/dL (ref 8.6–10.4)
Chloride: 100 mmol/L (ref 98–110)
Creat: 0.74 mg/dL (ref 0.50–0.99)
Glucose, Bld: 132 mg/dL — ABNORMAL HIGH (ref 70–99)
Potassium: 4.5 mmol/L (ref 3.5–5.3)
Sodium: 136 mmol/L (ref 135–146)
Total Bilirubin: 0.6 mg/dL (ref 0.2–1.2)
Total Protein: 6.5 g/dL (ref 6.1–8.1)

## 2015-10-22 LAB — CBC WITH DIFFERENTIAL/PLATELET
Basophils Absolute: 47 cells/uL (ref 0–200)
Basophils Relative: 1 %
Eosinophils Absolute: 94 cells/uL (ref 15–500)
Eosinophils Relative: 2 %
HCT: 40.4 % (ref 35.0–45.0)
Hemoglobin: 13 g/dL (ref 12.0–15.0)
Lymphocytes Relative: 57 %
Lymphs Abs: 2679 cells/uL (ref 850–3900)
MCH: 30.4 pg (ref 27.0–33.0)
MCHC: 32.2 g/dL (ref 32.0–36.0)
MCV: 94.6 fL (ref 80.0–100.0)
MPV: 10.4 fL (ref 7.5–12.5)
Monocytes Absolute: 423 cells/uL (ref 200–950)
Monocytes Relative: 9 %
Neutro Abs: 1457 cells/uL — ABNORMAL LOW (ref 1500–7800)
Neutrophils Relative %: 31 %
Platelets: 242 10*3/uL (ref 140–400)
RBC: 4.27 MIL/uL (ref 3.80–5.10)
RDW: 13.8 % (ref 11.0–15.0)
WBC: 4.7 10*3/uL (ref 3.8–10.8)

## 2015-10-22 NOTE — Telephone Encounter (Signed)
Refill appropriate and filled per protocol. 

## 2015-11-12 ENCOUNTER — Other Ambulatory Visit: Payer: Self-pay | Admitting: Family Medicine

## 2015-11-12 DIAGNOSIS — E039 Hypothyroidism, unspecified: Secondary | ICD-10-CM

## 2015-11-12 DIAGNOSIS — E785 Hyperlipidemia, unspecified: Secondary | ICD-10-CM

## 2015-11-12 DIAGNOSIS — I10 Essential (primary) hypertension: Secondary | ICD-10-CM

## 2015-11-12 DIAGNOSIS — R7301 Impaired fasting glucose: Secondary | ICD-10-CM

## 2015-11-12 DIAGNOSIS — E038 Other specified hypothyroidism: Secondary | ICD-10-CM

## 2015-11-19 ENCOUNTER — Other Ambulatory Visit: Payer: Medicare Other

## 2015-11-19 DIAGNOSIS — Z79899 Other long term (current) drug therapy: Secondary | ICD-10-CM

## 2015-11-19 DIAGNOSIS — R7301 Impaired fasting glucose: Secondary | ICD-10-CM | POA: Diagnosis not present

## 2015-11-19 DIAGNOSIS — E785 Hyperlipidemia, unspecified: Secondary | ICD-10-CM | POA: Diagnosis not present

## 2015-11-19 DIAGNOSIS — I10 Essential (primary) hypertension: Secondary | ICD-10-CM

## 2015-11-19 DIAGNOSIS — E039 Hypothyroidism, unspecified: Secondary | ICD-10-CM

## 2015-11-19 DIAGNOSIS — E038 Other specified hypothyroidism: Secondary | ICD-10-CM

## 2015-11-19 LAB — CBC WITH DIFFERENTIAL/PLATELET
Basophils Absolute: 48 cells/uL (ref 0–200)
Basophils Relative: 1 %
Eosinophils Absolute: 144 cells/uL (ref 15–500)
Eosinophils Relative: 3 %
HCT: 41.2 % (ref 35.0–45.0)
Hemoglobin: 13.6 g/dL (ref 12.0–15.0)
Lymphocytes Relative: 45 %
Lymphs Abs: 2160 cells/uL (ref 850–3900)
MCH: 31 pg (ref 27.0–33.0)
MCHC: 33 g/dL (ref 32.0–36.0)
MCV: 93.8 fL (ref 80.0–100.0)
MPV: 10.1 fL (ref 7.5–12.5)
Monocytes Absolute: 576 cells/uL (ref 200–950)
Monocytes Relative: 12 %
Neutro Abs: 1872 cells/uL (ref 1500–7800)
Neutrophils Relative %: 39 %
Platelets: 223 10*3/uL (ref 140–400)
RBC: 4.39 MIL/uL (ref 3.80–5.10)
RDW: 13.7 % (ref 11.0–15.0)
WBC: 4.8 10*3/uL (ref 3.8–10.8)

## 2015-11-19 LAB — HEMOGLOBIN A1C
Hgb A1c MFr Bld: 5.6 % (ref <5.7)
Mean Plasma Glucose: 114 mg/dL

## 2015-11-20 LAB — LIPID PANEL
Cholesterol: 191 mg/dL (ref 125–200)
HDL: 64 mg/dL (ref >=46)
LDL Cholesterol: 104 mg/dL (ref <130)
Total CHOL/HDL Ratio: 3 Ratio (ref <=5.0)
Triglycerides: 115 mg/dL (ref <150)
VLDL: 23 mg/dL (ref <30)

## 2015-11-20 LAB — COMPREHENSIVE METABOLIC PANEL
ALT: 10 U/L (ref 6–29)
AST: 17 U/L (ref 10–35)
Albumin: 4.3 g/dL (ref 3.6–5.1)
Alkaline Phosphatase: 82 U/L (ref 33–130)
BUN: 8 mg/dL (ref 7–25)
CO2: 27 mmol/L (ref 20–31)
Calcium: 10.1 mg/dL (ref 8.6–10.4)
Chloride: 102 mmol/L (ref 98–110)
Creat: 0.69 mg/dL (ref 0.50–0.99)
Glucose, Bld: 120 mg/dL — ABNORMAL HIGH (ref 70–99)
Potassium: 4.6 mmol/L (ref 3.5–5.3)
Sodium: 139 mmol/L (ref 135–146)
Total Bilirubin: 0.4 mg/dL (ref 0.2–1.2)
Total Protein: 6.5 g/dL (ref 6.1–8.1)

## 2015-11-27 ENCOUNTER — Other Ambulatory Visit: Payer: Self-pay | Admitting: Family Medicine

## 2015-12-05 ENCOUNTER — Telehealth: Payer: Self-pay | Admitting: Family Medicine

## 2015-12-05 DIAGNOSIS — M549 Dorsalgia, unspecified: Principal | ICD-10-CM

## 2015-12-05 DIAGNOSIS — G8929 Other chronic pain: Secondary | ICD-10-CM

## 2015-12-05 MED ORDER — OXYCODONE-ACETAMINOPHEN 5-325 MG PO TABS: 1.0000 | ORAL_TABLET | ORAL | Status: DC | PRN

## 2015-12-05 NOTE — Telephone Encounter (Signed)
Ok to refill 

## 2015-12-05 NOTE — Telephone Encounter (Signed)
Pt is requesting oxycodone 5-325 [phone]

## 2015-12-05 NOTE — Telephone Encounter (Signed)
ok 

## 2015-12-05 NOTE — Telephone Encounter (Signed)
RX printed, left up front and patient aware to pick up in am 

## 2015-12-09 ENCOUNTER — Ambulatory Visit (HOSPITAL_BASED_OUTPATIENT_CLINIC_OR_DEPARTMENT_OTHER): Payer: Medicare Other | Admitting: Physical Medicine & Rehabilitation

## 2015-12-09 ENCOUNTER — Encounter: Payer: Medicare Other | Attending: Physical Medicine & Rehabilitation

## 2015-12-09 ENCOUNTER — Encounter: Payer: Self-pay | Admitting: Physical Medicine & Rehabilitation

## 2015-12-09 VITALS — BP 117/83 | HR 91 | Resp 14

## 2015-12-09 DIAGNOSIS — Z981 Arthrodesis status: Secondary | ICD-10-CM | POA: Insufficient documentation

## 2015-12-09 DIAGNOSIS — M791 Myalgia: Secondary | ICD-10-CM | POA: Insufficient documentation

## 2015-12-09 DIAGNOSIS — M7918 Myalgia, other site: Secondary | ICD-10-CM

## 2015-12-09 DIAGNOSIS — M542 Cervicalgia: Secondary | ICD-10-CM | POA: Insufficient documentation

## 2015-12-09 DIAGNOSIS — M961 Postlaminectomy syndrome, not elsewhere classified: Secondary | ICD-10-CM | POA: Insufficient documentation

## 2015-12-09 DIAGNOSIS — G8929 Other chronic pain: Secondary | ICD-10-CM | POA: Insufficient documentation

## 2015-12-09 DIAGNOSIS — M25512 Pain in left shoulder: Secondary | ICD-10-CM | POA: Diagnosis not present

## 2015-12-09 NOTE — Progress Notes (Signed)
Subjective:    Patient ID: Janet Peterson, female    DOB: 1950-11-28, 65 y.o.   MRN: [id number]  HPI Chief complaint is neck pain, left shoulder pain 65 year old female with history of chronic spine pain. Spinous pain started in the neck area. She underwent spinal injections without relief. Then underwent C5-C7 ACDF by Dr. Rennis Harding. Prior to surgery, patient was having shooting pains down into the left hand, however, that improved postoperatively. Patient then developed increasing thoracic pain. She was evaluated by orthopedic spine surgery and diagnosed with thoracic scoliosis. She underwent T4-S 1 fusion With revision surgery. Initial surgery T10-S1, subsequent surgery T4-S1. These were both in 2013. Then, patient had partial removal of one of the rods in 2016 and bone grafting.   Patient has some residual pain in the left shoulder as noted above. Does not notice any numbness on a day-to-day basis. Has had problems with balance, no bowel or bladder problems. Patient states that she has difficulty with weakness in the left grasp.  Post- operatively, Patient has had physical therapy after all surgeries with exception of the surgery in 2016.  Patient takes oxycodone 5 mg 1-2 tablets per day. Patient denies any severe side effects with this medication. Previously tried tramadol. She was not helpful. Also, tried on gabapentin, which was not helpful. Patient had headaches with gabapentin.  Pain Inventory Average Pain 4 Pain Right Now 3 My pain is intermittent, sharp, burning and aching  In the last 24 hours, has pain interfered with the following? General activity 4 Relation with others 0 Enjoyment of life 4 What TIME of day is your pain at its worst? evening Sleep (in general) Fair  Pain is worse with: walking, bending, standing and some activites Pain improves with: rest, pacing activities and medication Relief from Meds: 7  Mobility walk without assistance how many minutes can  you walk? 10 ability to climb steps?  yes do you drive?  yes Do you have any goals in this area?  yes  Function retired Do you have any goals in this area?  yes  Neuro/Psych weakness depression anxiety  Prior Studies Any changes since last visit?  no  Physicians involved in your care Any changes since last visit?  no     Family History  Problem Relation Age of Onset  . Diabetes Mother   . Heart disease Mother   . Diabetes Father   . Anuerysm Father   . Diabetes Brother   . Heart disease Brother    Social History   Social History  . Marital Status: Married    Spouse Name: N/A  . Number of Children: N/A  . Years of Education: N/A   Social History Main Topics  . Smoking status: Former Smoker -- 0.50 packs/day for 10 years    Types: Cigarettes    Quit date: 03/07/1995  . Smokeless tobacco: Never Used  . Alcohol Use: Yes     Comment: rare  . Drug Use: No  . Sexual Activity: Not Asked   Other Topics Concern  . None   Social History Narrative   Past Surgical History  Procedure Laterality Date  . Bunionectomy  1991  . Cesarean section  1985  . Shoulder surgery Right 1997  . Posterior vitrectomy right eye and laser   10-27-1999  . Eye surgery  1995    rk (laser surgery), semi cornea transplant, detacted retina,  fluid removal  . Knee arthroscopy Left 03-03-2004  . Total knee arthroplasty Left  12-14-2005  . Uvulopalatopharyngoplasty  04-26-2006    w/  TONSILLECTOMY/  TURBINATE REDUCTIONS/  BILATERAL ANTERIOR ETHMOIDECTOMY  . Thoracic fusion  03-13-2013    REMOVAL HARDWARE/  BONE GRAFT FUSION T10//  REVISION OF RODS  . Cervical fusion  March 2013    C5 -- C7  . Breast surgery  1975    lumpectomy-- benign  . Spinal fixation surgery w/ implant  2013 rod #1//  2014  rod 2    S1 -- T10  (rod #1)//   S1 -- T4 (rod #2)  . Cataract extraction w/ intraocular lens  implant, bilateral  2000  . Distal interphalangeal joint fusion Right 03/14/2015    Procedure:  RIGHT LONG FINGER DISTAL INTERPHALANGEAL JOINT ARTHRODESIS;  Surgeon: Iran Planas, MD;  Location: [facility];  Service: Orthopedics;  Laterality: Right;   Past Medical History  Diagnosis Date  . OCD (obsessive compulsive disorder)   . GERD (gastroesophageal reflux disease)   . Hypertension   . Insomnia   . Fibromyalgia   . Osteopenia   . PVC (premature ventricular contraction)   . Hyperlipidemia   . Psoriatic arthritis ([place])   . Rheumatoid arthritis ([place])   . History of basal cell carcinoma excision     FACE, [place]  . Anxiety and depression   . History of hiatal hernia   . History of thrush   . OSA (obstructive sleep apnea)     MILD AND NO CPAP SINCE SURGERY IN 2007  . PONV (postoperative nausea and vomiting)   . Ankylosing spondylitis ([facility])    BP 117/83 mmHg  Pulse 91  Resp 14  SpO2 94%  Opioid Risk Score:   Fall Risk Score:  `1  Depression screen PHQ 2/9  Depression screen PHQ 2/9 12/09/2015  Decreased Interest 1  Down, Depressed, Hopeless 1  PHQ - 2 Score 2  Altered sleeping 1  Tired, decreased energy 3  Change in appetite 1  Feeling bad or failure about yourself  0  Trouble concentrating 0  Moving slowly or fidgety/restless 0  Suicidal thoughts 0  PHQ-9 Score 7  Difficult doing work/chores Somewhat difficult      Review of Systems  Constitutional: Positive for diaphoresis and unexpected weight change.  Eyes: Negative.   Respiratory: Negative.   Cardiovascular: Positive for leg swelling.  Gastrointestinal: Positive for constipation.  Endocrine: Negative.   Genitourinary: Negative.   Musculoskeletal: Positive for back pain, neck pain and neck stiffness.  Neurological: Positive for weakness.  Hematological: Negative.   Psychiatric/Behavioral: Positive for dysphoric mood. The patient is nervous/anxious.        Objective:   Physical Exam  Constitutional: She is oriented to person, place, and time. She appears well-developed and  well-nourished.  HENT:  Head: Normocephalic and atraumatic.  Eyes: Conjunctivae and EOM are normal. Pupils are equal, round, and reactive to light.  Cardiovascular: Normal rate, regular rhythm and normal heart sounds.   Pulmonary/Chest: Effort normal. No respiratory distress. She has no wheezes.  Abdominal: She exhibits no distension.  Neurological: She is alert and oriented to person, place, and time.  Psychiatric: She has a normal mood and affect.  Nursing note and vitals reviewed.  Motor strength is 5/5 bilateral deltoid, biceps, triceps and grip Decreased sensation left C5, intact bilateral C6, C7, C8 dermatomal distribution. Pinprick sensation.  Palpable. Palpable instrumentation rod, left upper thoracic. Tenderness to palpation left trapezius Cervical range of motion 50% flexion, extension, lateral bending and rotation  Assessment & Plan:  1. Cervical postlaminectomy syndrome, in a patient with history of prior thoraco- Lumbar fusion.  I suspect patient may have some adjacent level degeneration around T3, T4. given the location of this discomfort. Patient has pain mainly when working with her hands in front of her body. I do not think there is any type of spinal injection that would be helpful in this situation.  She may try using OTC TENS unit to left periscapular region.  If this is only partially helpful would consider PENS-Biowave system  2. Cervical myofascial pain affecting left trapezius. Has tried topical patches and creams. Also has tried analgesic medications.  We'll do trigger point injections today  Trigger Point Injection  Indication: Left trapezius Myofascial pain not relieved by medication management and other conservative care.  Informed consent was obtained after describing risk and benefits of the procedure with the patient, this includes bleeding, bruising, infection and medication side effects.  The patient wishes to proceed and has given written  consent.  The patient was placed in a seated position.  The left trapezius area was marked and prepped with Betadine.  It was entered with a 25-gauge 1-1/2 inch needle and 1 mL of 1% lidocaine was injected into each of 2 trigger points, after negative draw back for blood.  The patient tolerated the procedure well.  Post procedure instructions were given.

## 2015-12-09 NOTE — Patient Instructions (Signed)
He did a trigger point injection. You may have some soreness in that area may apply ice 20 minutes every 2 hours for the next 24 hours as needed   I recommend over-the-counter TENS unit. This can be placed over the area where you have your pain near the left shoulder blade  If you get partial relief with this treatment, you may benefit from percutaneous "nerve stimulation such as by a wave system. We can discuss this at the next visit

## 2015-12-10 DIAGNOSIS — J310 Chronic rhinitis: Secondary | ICD-10-CM | POA: Diagnosis not present

## 2015-12-24 DIAGNOSIS — Z79899 Other long term (current) drug therapy: Secondary | ICD-10-CM | POA: Diagnosis not present

## 2015-12-24 DIAGNOSIS — L408 Other psoriasis: Secondary | ICD-10-CM | POA: Diagnosis not present

## 2015-12-24 DIAGNOSIS — M25561 Pain in right knee: Secondary | ICD-10-CM | POA: Diagnosis not present

## 2015-12-24 DIAGNOSIS — E559 Vitamin D deficiency, unspecified: Secondary | ICD-10-CM | POA: Diagnosis not present

## 2015-12-24 DIAGNOSIS — M8589 Other specified disorders of bone density and structure, multiple sites: Secondary | ICD-10-CM | POA: Diagnosis not present

## 2015-12-27 ENCOUNTER — Encounter: Payer: Self-pay | Admitting: Family Medicine

## 2015-12-27 DIAGNOSIS — D1801 Hemangioma of skin and subcutaneous tissue: Secondary | ICD-10-CM | POA: Diagnosis not present

## 2015-12-27 DIAGNOSIS — L821 Other seborrheic keratosis: Secondary | ICD-10-CM | POA: Diagnosis not present

## 2015-12-27 DIAGNOSIS — D2371 Other benign neoplasm of skin of right lower limb, including hip: Secondary | ICD-10-CM | POA: Diagnosis not present

## 2015-12-27 DIAGNOSIS — L723 Sebaceous cyst: Secondary | ICD-10-CM | POA: Diagnosis not present

## 2015-12-27 DIAGNOSIS — Z85828 Personal history of other malignant neoplasm of skin: Secondary | ICD-10-CM | POA: Diagnosis not present

## 2015-12-27 DIAGNOSIS — L820 Inflamed seborrheic keratosis: Secondary | ICD-10-CM | POA: Diagnosis not present

## 2015-12-27 DIAGNOSIS — D485 Neoplasm of uncertain behavior of skin: Secondary | ICD-10-CM | POA: Diagnosis not present

## 2015-12-27 DIAGNOSIS — L814 Other melanin hyperpigmentation: Secondary | ICD-10-CM | POA: Diagnosis not present

## 2015-12-27 DIAGNOSIS — D224 Melanocytic nevi of scalp and neck: Secondary | ICD-10-CM | POA: Diagnosis not present

## 2016-01-07 DIAGNOSIS — F329 Major depressive disorder, single episode, unspecified: Secondary | ICD-10-CM | POA: Diagnosis not present

## 2016-01-08 ENCOUNTER — Other Ambulatory Visit: Payer: Self-pay | Admitting: Family Medicine

## 2016-01-08 DIAGNOSIS — Z1231 Encounter for screening mammogram for malignant neoplasm of breast: Secondary | ICD-10-CM

## 2016-01-17 ENCOUNTER — Ambulatory Visit (HOSPITAL_BASED_OUTPATIENT_CLINIC_OR_DEPARTMENT_OTHER): Payer: Medicare Other | Admitting: Physical Medicine & Rehabilitation

## 2016-01-17 ENCOUNTER — Encounter: Payer: Medicare Other | Attending: Physical Medicine & Rehabilitation

## 2016-01-17 ENCOUNTER — Encounter: Payer: Self-pay | Admitting: Physical Medicine & Rehabilitation

## 2016-01-17 VITALS — BP 119/86 | HR 80

## 2016-01-17 DIAGNOSIS — G243 Spasmodic torticollis: Secondary | ICD-10-CM | POA: Diagnosis not present

## 2016-01-17 DIAGNOSIS — M542 Cervicalgia: Secondary | ICD-10-CM | POA: Diagnosis not present

## 2016-01-17 DIAGNOSIS — Z981 Arthrodesis status: Secondary | ICD-10-CM | POA: Diagnosis not present

## 2016-01-17 DIAGNOSIS — M961 Postlaminectomy syndrome, not elsewhere classified: Secondary | ICD-10-CM | POA: Diagnosis not present

## 2016-01-17 DIAGNOSIS — M25512 Pain in left shoulder: Secondary | ICD-10-CM | POA: Diagnosis not present

## 2016-01-17 DIAGNOSIS — G8929 Other chronic pain: Secondary | ICD-10-CM | POA: Insufficient documentation

## 2016-01-17 DIAGNOSIS — M791 Myalgia: Secondary | ICD-10-CM | POA: Insufficient documentation

## 2016-01-17 NOTE — Patient Instructions (Addendum)
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

## 2016-01-17 NOTE — Progress Notes (Signed)
Subjective:    Patient ID: Janet Peterson, female    DOB: [birth year], 65 y.o.   MRN: [id number]  HPI   CC Neck and left-sided shoulder pain, upper trapezius pain Left trapezius trigger point injection was helpful for couple weeks. The TENS unit only helped a little bit. She states that the left scapula. Pain is not a daily problem, it occurs a couple times a week on average Has tried some BenGay did not have much relief from this. Patient has not tried acupuncture. Pain Inventory Average Pain 4 Pain Right Now 5 My pain is intermittent, burning and aching  In the last 24 hours, has pain interfered with the following? General activity 3 Relation with others 0 Enjoyment of life 2 What TIME of day is your pain at its worst? evening Sleep (in general) Fair  Pain is worse with: bending, standing and some activites Pain improves with: rest, medication and injections Relief from Meds: 6  Mobility walk without assistance how many minutes can you walk? 15 ability to climb steps?  yes do you drive?  yes Do you have any goals in this area?  yes  Function retired Do you have any goals in this area?  no  Neuro/Psych weakness trouble walking depression anxiety  Prior Studies Any changes since last visit?  no  Physicians involved in your care Any changes since last visit?  no   Family History  Problem Relation Age of Onset  . Diabetes Mother   . Heart disease Mother   . Diabetes Father   . Anuerysm Father   . Diabetes Brother   . Heart disease Brother    Social History   Social History  . Marital status: Married    Spouse name: N/A  . Number of children: N/A  . Years of education: N/A   Social History Main Topics  . Smoking status: Former Smoker    Packs/day: 0.50    Years: 10.00    Types: Cigarettes    Quit date: 03/07/1995  . Smokeless tobacco: Never Used  . Alcohol use Yes     Comment: rare  . Drug use: No  . Sexual activity: Not Asked   Other Topics  Concern  . None   Social History Narrative  . None   Past Surgical History:  Procedure Laterality Date  . BREAST SURGERY  1975   lumpectomy-- benign  . BUNIONECTOMY  1991  . CATARACT EXTRACTION W/ INTRAOCULAR LENS  IMPLANT, BILATERAL  2000  . CERVICAL FUSION  March 2013   C5 -- C7  . CESAREAN SECTION  1985  . DISTAL INTERPHALANGEAL JOINT FUSION Right 03/14/2015   Procedure: RIGHT LONG FINGER DISTAL INTERPHALANGEAL JOINT ARTHRODESIS;  Surgeon: Iran Planas, MD;  Location: [facility];  Service: Orthopedics;  Laterality: Right;  . EYE SURGERY  1995   rk (laser surgery), semi cornea transplant, detacted retina,  fluid removal  . KNEE ARTHROSCOPY Left 03-03-2004  . POSTERIOR VITRECTOMY RIGHT EYE AND LASER   10-27-1999  . SHOULDER SURGERY Right 1997  . SPINAL FIXATION SURGERY W/ IMPLANT  2013 rod #1//  2014  rod 2   S1 -- T10  (rod #1)//   S1 -- T4 (rod #2)  . THORACIC FUSION  03-13-2013   REMOVAL HARDWARE/  BONE GRAFT FUSION T10//  REVISION OF RODS  . TOTAL KNEE ARTHROPLASTY Left 12-14-2005  . UVULOPALATOPHARYNGOPLASTY  04-26-2006   w/  TONSILLECTOMY/  TURBINATE REDUCTIONS/  BILATERAL ANTERIOR ETHMOIDECTOMY   Past  Medical History:  Diagnosis Date  . Ankylosing spondylitis ([place])   . Anxiety and depression   . Fibromyalgia   . GERD (gastroesophageal reflux disease)   . History of basal cell carcinoma excision    FACE, [place]  . History of hiatal hernia   . History of thrush   . Hyperlipidemia   . Hypertension   . Insomnia   . OCD (obsessive compulsive disorder)   . OSA (obstructive sleep apnea)    MILD AND NO CPAP SINCE SURGERY IN 2007  . Osteopenia   . PONV (postoperative nausea and vomiting)   . Psoriatic arthritis ([place])   . PVC (premature ventricular contraction)   . Rheumatoid arthritis ([facility])    BP 119/86   Pulse 80   SpO2 96%   Opioid Risk Score:   Fall Risk Score:  `1  Depression screen PHQ 2/9  Depression screen PHQ 2/9 12/09/2015    Decreased Interest 1  Down, Depressed, Hopeless 1  PHQ - 2 Score 2  Altered sleeping 1  Tired, decreased energy 3  Change in appetite 1  Feeling bad or failure about yourself  0  Trouble concentrating 0  Moving slowly or fidgety/restless 0  Suicidal thoughts 0  PHQ-9 Score 7  Difficult doing work/chores Somewhat difficult    Review of Systems  HENT: Negative.   Eyes: Negative.   Respiratory: Negative.   Cardiovascular: Positive for leg swelling.  Gastrointestinal: Negative.   Endocrine: Negative.   Genitourinary: Negative.   Musculoskeletal: Positive for neck pain.  Skin: Negative.   Allergic/Immunologic: Negative.   Neurological: Positive for weakness.  Hematological: Negative.   Psychiatric/Behavioral: Positive for dysphoric mood. The patient is nervous/anxious.        Objective:   Physical Exam  Constitutional: She is oriented to person, place, and time. She appears well-developed and well-nourished.  HENT:  Head: Normocephalic and atraumatic.  Eyes: Pupils are equal, round, and reactive to light.  Musculoskeletal:       Left shoulder: Normal.  Neurological: She is alert and oriented to person, place, and time.  Psychiatric: She has a normal mood and affect.  Nursing note and vitals reviewed.  Tenderness to palpation and increased tone at the left trapezius, left splenius capitis, left semispinalis capitis.  She has pain with cervical extension but not with cervical flexion. She has mild pain with cervical lateral bending, left greater than right. She has normal range of motion with rotation.  Upper extremity strength is normal.         Assessment & Plan:  1. Chronic neck pain. 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 We discussed treatment options for this, which include botulinum toxin injections.  2. Cervical postlaminectomy  syndrome with chronic pain. She takes 1 or 2. Oxycodone 5 mg tablets per day, and these are prescribed by her primary care physician.

## 2016-01-22 ENCOUNTER — Telehealth: Payer: Self-pay | Admitting: *Deleted

## 2016-01-22 DIAGNOSIS — G8929 Other chronic pain: Secondary | ICD-10-CM

## 2016-01-22 DIAGNOSIS — M549 Dorsalgia, unspecified: Principal | ICD-10-CM

## 2016-01-22 NOTE — Telephone Encounter (Signed)
Received call from patient.   Requested refill on Percocet.   Ok to refill??  Last office visit 09/24/2015.  Last refill 12/05/2015.

## 2016-01-23 MED ORDER — OXYCODONE-ACETAMINOPHEN 5-325 MG PO TABS: 1.0000 | ORAL_TABLET | ORAL | 0 refills | Status: DC | PRN

## 2016-01-23 NOTE — Telephone Encounter (Signed)
Prescription printed and patient made aware to come to office to pick up after 2 pm on 01/23/2016.

## 2016-01-23 NOTE — Telephone Encounter (Signed)
ok 

## 2016-02-04 ENCOUNTER — Ambulatory Visit
Admission: RE | Admit: 2016-02-04 | Discharge: 2016-02-04 | Disposition: A | Discharge status: Home / Self Care | Payer: Medicare Other | Source: Ambulatory Visit | Attending: Family Medicine | Admitting: Family Medicine

## 2016-02-04 DIAGNOSIS — Z1231 Encounter for screening mammogram for malignant neoplasm of breast: Secondary | ICD-10-CM

## 2016-02-06 ENCOUNTER — Ambulatory Visit (HOSPITAL_BASED_OUTPATIENT_CLINIC_OR_DEPARTMENT_OTHER): Payer: Medicare Other | Admitting: Physical Medicine & Rehabilitation

## 2016-02-06 ENCOUNTER — Encounter: Payer: Self-pay | Admitting: Physical Medicine & Rehabilitation

## 2016-02-06 ENCOUNTER — Encounter: Payer: Medicare Other | Attending: Physical Medicine & Rehabilitation

## 2016-02-06 VITALS — BP 131/89 | HR 88 | Resp 17

## 2016-02-06 DIAGNOSIS — M791 Myalgia: Secondary | ICD-10-CM | POA: Insufficient documentation

## 2016-02-06 DIAGNOSIS — G8929 Other chronic pain: Secondary | ICD-10-CM | POA: Insufficient documentation

## 2016-02-06 DIAGNOSIS — M542 Cervicalgia: Secondary | ICD-10-CM | POA: Insufficient documentation

## 2016-02-06 DIAGNOSIS — M961 Postlaminectomy syndrome, not elsewhere classified: Secondary | ICD-10-CM | POA: Insufficient documentation

## 2016-02-06 DIAGNOSIS — M25512 Pain in left shoulder: Secondary | ICD-10-CM | POA: Insufficient documentation

## 2016-02-06 DIAGNOSIS — G243 Spasmodic torticollis: Secondary | ICD-10-CM | POA: Diagnosis not present

## 2016-02-06 DIAGNOSIS — Z981 Arthrodesis status: Secondary | ICD-10-CM | POA: Diagnosis not present

## 2016-02-06 NOTE — Progress Notes (Signed)
Botulinum toxin injection for cervical dystonia CPT code [phone] Diagnosis code G 24.3 Indication is cervical dystonia that has not responded to conservative care and interferes with activities of daily living as well as cervical range of motion. Chronic cervical pain not relieved by other treatments.  Informed consent was obtained after describing risks and benefits of the procedure with the patient this included bleeding bruising and infection The patient elects to proceed and has given Written consent.  REMS form completed  Patient placed in a seated position A 27-gauge 1 inch needle electrode was used to guide the injection under EMG guidance.  Muscles and dosing: Left trap 75U Left splenius cap 75U Left semispinalis capitus 50U  All injections done after negative drawback for blood. Patient tolerated procedure well. Post procedure instructions given. Follow up appointment made

## 2016-02-13 ENCOUNTER — Telehealth: Payer: Self-pay | Admitting: Physical Medicine & Rehabilitation

## 2016-02-13 NOTE — Telephone Encounter (Signed)
I talked to the patient and explained the effects of Botox. It sounds like she is just having some muscle weakness.

## 2016-02-13 NOTE — Telephone Encounter (Signed)
Please advise 

## 2016-02-13 NOTE — Telephone Encounter (Signed)
Patient had a Botox injection in her neck and it is getting very stiff and going into shoulder.  This pain is very unbearable and would like to know what she should do.  Please call patient.

## 2016-03-17 ENCOUNTER — Other Ambulatory Visit: Payer: Self-pay | Admitting: Rheumatology

## 2016-03-17 NOTE — Telephone Encounter (Signed)
I have called patient to advise her she needs Vit D labs for refill of Vit D / patient understands [facility], she is aware she needs the lab

## 2016-03-20 ENCOUNTER — Encounter: Payer: Self-pay | Admitting: Physical Medicine & Rehabilitation

## 2016-03-20 ENCOUNTER — Encounter: Payer: Medicare Other | Attending: Physical Medicine & Rehabilitation

## 2016-03-20 ENCOUNTER — Ambulatory Visit (HOSPITAL_BASED_OUTPATIENT_CLINIC_OR_DEPARTMENT_OTHER): Payer: Medicare Other | Admitting: Physical Medicine & Rehabilitation

## 2016-03-20 VITALS — BP 134/99 | HR 98 | Resp 14

## 2016-03-20 DIAGNOSIS — G243 Spasmodic torticollis: Secondary | ICD-10-CM | POA: Diagnosis not present

## 2016-03-20 DIAGNOSIS — M542 Cervicalgia: Secondary | ICD-10-CM | POA: Diagnosis not present

## 2016-03-20 DIAGNOSIS — M5382 Other specified dorsopathies, cervical region: Secondary | ICD-10-CM | POA: Diagnosis not present

## 2016-03-20 DIAGNOSIS — M25512 Pain in left shoulder: Secondary | ICD-10-CM | POA: Insufficient documentation

## 2016-03-20 DIAGNOSIS — Z981 Arthrodesis status: Secondary | ICD-10-CM | POA: Insufficient documentation

## 2016-03-20 DIAGNOSIS — G8929 Other chronic pain: Secondary | ICD-10-CM | POA: Insufficient documentation

## 2016-03-20 DIAGNOSIS — M791 Myalgia: Secondary | ICD-10-CM | POA: Diagnosis not present

## 2016-03-20 DIAGNOSIS — M961 Postlaminectomy syndrome, not elsewhere classified: Secondary | ICD-10-CM | POA: Diagnosis not present

## 2016-03-20 NOTE — Patient Instructions (Signed)
Botox will wear off completely. At around 3 months. Post injection. You may notice some strengthening of neck muscle starting around Thanksgiving You may perform neck extension exercises as I demonstrated twice a day

## 2016-03-20 NOTE — Progress Notes (Signed)
Subjective:    Patient ID: Janet Peterson, female    DOB: 26-May-1950, 65 y.o.   MRN: [id number]  HPI  65 year old female with history of extensive thoracic fusion from T4-S1 as well as C5-C7 ACDF She has had chronic postoperative pain in the cervical spine area with complaints of tightness. She underwent botulinum toxin injection after minimal relief following trapezius trigger point injections with lidocaine. On 02/06/2016. The following muscles were injected Left trap 75U Left splenius cap 75U Left semispinalis capitus 50U  Starting a week postinjection. Patient noticed some neck weakness. She has been using a soft cervical collar to help support the head. Her neck pain, however, has improved almost completely Pain Inventory Average Pain 2 Pain Right Now 0 My pain is n/a  In the last 24 hours, has pain interfered with the following? General activity 0 Relation with others 0 Enjoyment of life 3 What TIME of day is your pain at its worst? evening Sleep (in general) Fair  Pain is worse with: bending, standing and some activites Pain improves with: rest, medication and injections Relief from Meds: 6  Mobility walk without assistance how many minutes can you walk? 15 ability to climb steps?  yes do you drive?  yes Do you have any goals in this area?  yes  Function retired Do you have any goals in this area?  no  Neuro/Psych weakness trouble walking depression anxiety  Prior Studies Any changes since last visit?  no  Physicians involved in your care Any changes since last visit?  no   Family History  Problem Relation Age of Onset  . Diabetes Mother   . Heart disease Mother   . Diabetes Father   . Anuerysm Father   . Diabetes Brother   . Heart disease Brother    Social History   Social History  . Marital status: Married    Spouse name: N/A  . Number of children: N/A  . Years of education: N/A   Social History Main Topics  . Smoking status: Former  Smoker    Packs/day: 0.50    Years: 10.00    Types: Cigarettes    Quit date: 03/07/1995  . Smokeless tobacco: Never Used  . Alcohol use Yes     Comment: rare  . Drug use: No  . Sexual activity: Not Asked   Other Topics Concern  . None   Social History Narrative  . None   Past Surgical History:  Procedure Laterality Date  . BREAST SURGERY  1975   lumpectomy-- benign  . BUNIONECTOMY  1991  . CATARACT EXTRACTION W/ INTRAOCULAR LENS  IMPLANT, BILATERAL  2000  . CERVICAL FUSION  March 2013   C5 -- C7  . CESAREAN SECTION  1985  . DISTAL INTERPHALANGEAL JOINT FUSION Right 03/14/2015   Procedure: RIGHT LONG FINGER DISTAL INTERPHALANGEAL JOINT ARTHRODESIS;  Surgeon: Iran Planas, MD;  Location: [facility];  Service: Orthopedics;  Laterality: Right;  . EYE SURGERY  1995   rk (laser surgery), semi cornea transplant, detacted retina,  fluid removal  . KNEE ARTHROSCOPY Left 03-03-2004  . POSTERIOR VITRECTOMY RIGHT EYE AND LASER   10-27-1999  . SHOULDER SURGERY Right 1997  . SPINAL FIXATION SURGERY W/ IMPLANT  2013 rod #1//  2014  rod 2   S1 -- T10  (rod #1)//   S1 -- T4 (rod #2)  . THORACIC FUSION  03-13-2013   REMOVAL HARDWARE/  BONE GRAFT FUSION T10//  REVISION OF RODS  .  TOTAL KNEE ARTHROPLASTY Left 12-14-2005  . UVULOPALATOPHARYNGOPLASTY  04-26-2006   w/  TONSILLECTOMY/  TURBINATE REDUCTIONS/  BILATERAL ANTERIOR ETHMOIDECTOMY   Past Medical History:  Diagnosis Date  . Ankylosing spondylitis ([place])   . Anxiety and depression   . Fibromyalgia   . GERD (gastroesophageal reflux disease)   . History of basal cell carcinoma excision    FACE, [place]  . History of hiatal hernia   . History of thrush   . Hyperlipidemia   . Hypertension   . Insomnia   . OCD (obsessive compulsive disorder)   . OSA (obstructive sleep apnea)    MILD AND NO CPAP SINCE SURGERY IN 2007  . Osteopenia   . PONV (postoperative nausea and vomiting)   . Psoriatic arthritis ([place])   .  PVC (premature ventricular contraction)   . Rheumatoid arthritis ([facility])    BP (!) 134/99   Pulse 98   Resp 14   SpO2 95%   Opioid Risk Score:   Fall Risk Score:  `1  Depression screen PHQ 2/9  Depression screen PHQ 2/9 12/09/2015  Decreased Interest 1  Down, Depressed, Hopeless 1  PHQ - 2 Score 2  Altered sleeping 1  Tired, decreased energy 3  Change in appetite 1  Feeling bad or failure about yourself  0  Trouble concentrating 0  Moving slowly or fidgety/restless 0  Suicidal thoughts 0  PHQ-9 Score 7  Difficult doing work/chores Somewhat difficult     Review of Systems  All other systems reviewed and are negative.      Objective:   Physical Exam  Constitutional: She is oriented to person, place, and time. She appears well-developed and well-nourished.  HENT:  Head: Normocephalic and atraumatic.  Eyes: Conjunctivae and EOM are normal. Pupils are equal, round, and reactive to light.  Neck:  Patient has 100% flexion, extension 75%, lateral imaging and rotation bilaterally. She has no evidence of extensor lag in the head and neck area  Neurological: She is alert and oriented to person, place, and time.  Psychiatric: She has a normal mood and affect. Her behavior is normal. Judgment and thought content normal.  Nursing note and vitals reviewed.  Upper extremity strength is 5/5 deltoid, biceps, triceps, grip  Mood and affect are appropriate.      Assessment & Plan:  , 1. Cervical dystonia. Postoperative with excellent improvement in terms of pain. Patient has subjective weakness of the neck musculature. We discussed that Botox has a duration of 3 months.  While I do not appreciate any objective cervical weakness, patient is not sure whether she wants repeat injection. Despite her excellent pain relief. We will discuss this further in 6-7 weeks and an office visit and then decide whether to proceed with repeat Botox injection at lower dose, keeping the same muscle  groups.

## 2016-03-25 ENCOUNTER — Telehealth: Payer: Self-pay | Admitting: *Deleted

## 2016-03-25 NOTE — Telephone Encounter (Signed)
Received call from patient.   Requested refill on Percocet.   Ok to refill??  Last office visit 09/24/2015.  Last refill 01/23/2016.

## 2016-03-26 MED ORDER — OXYCODONE-ACETAMINOPHEN 5-325 MG PO TABS: 1.0000 | ORAL_TABLET | ORAL | 0 refills | Status: DC | PRN

## 2016-03-26 NOTE — Telephone Encounter (Signed)
Prescription printed and patient made aware to come to office to pick up after 2 pm on 03/26/2016.

## 2016-03-26 NOTE — Telephone Encounter (Signed)
ok 

## 2016-03-27 ENCOUNTER — Ambulatory Visit (INDEPENDENT_AMBULATORY_CARE_PROVIDER_SITE_OTHER): Payer: Medicare Other | Admitting: Family Medicine

## 2016-03-27 DIAGNOSIS — Z23 Encounter for immunization: Secondary | ICD-10-CM

## 2016-03-31 DIAGNOSIS — F329 Major depressive disorder, single episode, unspecified: Secondary | ICD-10-CM | POA: Diagnosis not present

## 2016-04-21 ENCOUNTER — Telehealth: Payer: Self-pay

## 2016-04-21 NOTE — Telephone Encounter (Signed)
Called patient to discuss Simponi.  We had applied for Simponi patient assistance in August and received temporary (30-day) approval for the patient assistance program.  Patient was asked to submit additional documentation to get approval through 05/24/16.    Called patient to discuss whether she wants to get Simp[facility] through [facility] or through patient assistance program.  Patient confirms she submitted the additional form and was approved for Simponi patient assistance program through 05/24/16.  She reports the patient assistance program gave her information to get her prescription from [facility] and they need prior authorization.  I informed her that the prior authorization was submitted.  She voiced understanding.   Elisabeth Most, Pharm.D., BCPS Clinical Pharmacist Pager: [phone] Phone: [phone] 04/21/2016 12:53 PM

## 2016-04-21 NOTE — Telephone Encounter (Signed)
Received P.[facility]. Request from [facility] for [facility]oni. Faxed forms  Hopkins, [place], CPhT

## 2016-04-22 ENCOUNTER — Telehealth: Payer: Self-pay

## 2016-04-22 NOTE — Progress Notes (Deleted)
Received a fax from [place] regarding a prior authorization approval for Simponi from 01/22/16 to 04/21/17.   Will scan document into epic.  Hopkins, [place], CPhT  3:33 PM

## 2016-04-22 NOTE — Progress Notes (Addendum)
Received a fax from [place] regarding a prior authorization approval for Simponi from 01/22/16 to 04/21/17.  Called patient to let her know.  Will scan document into epic.  Hopkins, [place], CPhT   3:42 PM

## 2016-04-22 NOTE — Telephone Encounter (Signed)
Called patient to inform her that her simponi was approved by her insurance from 01/22/16 to 04/21/17.  Will upload consent in epic.  Hopkins, [place], CPhT 3:49 PM

## 2016-04-23 DIAGNOSIS — H52223 Regular astigmatism, bilateral: Secondary | ICD-10-CM | POA: Diagnosis not present

## 2016-04-23 DIAGNOSIS — H5213 Myopia, bilateral: Secondary | ICD-10-CM | POA: Diagnosis not present

## 2016-04-23 DIAGNOSIS — H04123 Dry eye syndrome of bilateral lacrimal glands: Secondary | ICD-10-CM | POA: Diagnosis not present

## 2016-04-23 DIAGNOSIS — H524 Presbyopia: Secondary | ICD-10-CM | POA: Diagnosis not present

## 2016-04-27 ENCOUNTER — Telehealth: Payer: Self-pay | Admitting: Rheumatology

## 2016-04-27 NOTE — Telephone Encounter (Signed)
Patient says [facility] has not received approval for Simponi and patient is wondering what is going on with it.

## 2016-04-29 ENCOUNTER — Telehealth: Payer: Self-pay

## 2016-04-29 ENCOUNTER — Other Ambulatory Visit: Payer: Self-pay | Admitting: *Deleted

## 2016-04-29 DIAGNOSIS — E559 Vitamin D deficiency, unspecified: Secondary | ICD-10-CM

## 2016-04-29 MED ORDER — GOLIMUMAB 50 MG/0.5ML ~~LOC~~ SOAJ: 0.5000 mL | SUBCUTANEOUS | 0 refills | Status: DC

## 2016-04-29 NOTE — Telephone Encounter (Signed)
Spoke to [employer] about RX for simponi. Pharmacy is unable to run claim as her patient assistance ID did not match what was in the system.  I called the patient assistance program and verified ID.  Simponi claim was re-ran at the pharmacy but claim was still unable to be processed as patient's card was only active through 04/02/16.  I spoke to a representative at [facility] patient assistance program who states they will look into patient's account and contact me back tomorrow.    I called the patient to update her.  Advised her that prescription may not be sent until next week as we are waiting to hear from the patient program.  Patient voiced understanding.   Advised her we will continue to update her as we know more information.

## 2016-04-29 NOTE — Telephone Encounter (Signed)
I called [facility]. Representative states that her RX for simponi is rejecting on her insurance. The rejection states that patient is enrolled in a patient assistance program. I called the PAP ([employer]) to verify benefits. Patient has temporary assistance through 05/24/16. She should have received an RX card that covers the the cost of the medication. She can have the RX filled at any pharmacy with the card. He gave me the card information for her if she misplaced her car.

## 2016-04-29 NOTE — Telephone Encounter (Addendum)
I called patient to explain how patient assistance program works.  Advised patient that she can get the prescription filled at any pharmacy.  Patient decided she would like to get the prescription filled at [facility].    Last Visit: 12/24/15 Next visit: 05/07/16 Standing Labs: 12/24/15 were normal TB Gold: 07/22/15 negative  Patient is due for her standing labs this month.  She is scheduled for a follow up visit next week.  Okay to fill 30 day supply of Simponi at this time?

## 2016-04-29 NOTE — Telephone Encounter (Signed)
Can you call [facility] on this?  We have received prior approval from patient's insurance company.

## 2016-04-29 NOTE — Telephone Encounter (Signed)
Ok to give 30 d supply.

## 2016-04-29 NOTE — Telephone Encounter (Signed)
Prescription sent pharmacy.

## 2016-04-30 ENCOUNTER — Telehealth: Payer: Self-pay | Admitting: Pharmacist

## 2016-04-30 ENCOUNTER — Other Ambulatory Visit: Payer: Self-pay | Admitting: Radiology

## 2016-04-30 DIAGNOSIS — E559 Vitamin D deficiency, unspecified: Secondary | ICD-10-CM

## 2016-04-30 DIAGNOSIS — Z79899 Other long term (current) drug therapy: Secondary | ICD-10-CM | POA: Diagnosis not present

## 2016-04-30 LAB — CBC WITH DIFFERENTIAL/PLATELET
Basophils Absolute: 37 cells/uL (ref 0–200)
Basophils Relative: 1 %
Eosinophils Absolute: 37 cells/uL (ref 15–500)
Eosinophils Relative: 1 %
HCT: 36.6 % (ref 35.0–45.0)
Hemoglobin: 11.7 g/dL (ref 11.7–15.5)
Lymphocytes Relative: 50 %
Lymphs Abs: 1850 cells/uL (ref 850–3900)
MCH: 30.2 pg (ref 27.0–33.0)
MCHC: 32 g/dL (ref 32.0–36.0)
MCV: 94.3 fL (ref 80.0–100.0)
MPV: 10.7 fL (ref 7.5–12.5)
Monocytes Absolute: 407 cells/uL (ref 200–950)
Monocytes Relative: 11 %
Neutro Abs: 1369 cells/uL — ABNORMAL LOW (ref 1500–7800)
Neutrophils Relative %: 37 %
Platelets: 219 10*3/uL (ref 140–400)
RBC: 3.88 MIL/uL (ref 3.80–5.10)
RDW: 13.8 % (ref 11.0–15.0)
WBC: 3.7 10*3/uL — ABNORMAL LOW (ref 3.8–10.8)

## 2016-04-30 LAB — COMPLETE METABOLIC PANEL WITH GFR
ALT: 8 U/L (ref 6–29)
AST: 18 U/L (ref 10–35)
Albumin: 3.5 g/dL — ABNORMAL LOW (ref 3.6–5.1)
Alkaline Phosphatase: 65 U/L (ref 33–130)
BUN: 7 mg/dL (ref 7–25)
CO2: 26 mmol/L (ref 20–31)
Calcium: 9.1 mg/dL (ref 8.6–10.4)
Chloride: 105 mmol/L (ref 98–110)
Creat: 0.6 mg/dL (ref 0.50–0.99)
GFR, Est African American: >89 mL/min (ref >=60)
GFR, Est Non African American: >89 mL/min (ref >=60)
Glucose, Bld: 94 mg/dL (ref 65–99)
Potassium: 4.2 mmol/L (ref 3.5–5.3)
Sodium: 139 mmol/L (ref 135–146)
Total Bilirubin: 0.4 mg/dL (ref 0.2–1.2)
Total Protein: 5.5 g/dL — ABNORMAL LOW (ref 6.1–8.1)

## 2016-04-30 NOTE — Telephone Encounter (Signed)
I received a phone call from [place] asking the status on the prior approval for patient's Simponi prescription.  I informed them that we had to fax more information to the Simponi patient assistance program today and we provided patient with a sample of Simponi in the office today.  They will put her prescription on hold.    Elisabeth Most, Pharm.D., BCPS Clinical Pharmacist Pager: [phone] Phone: [phone] 04/30/2016 5:07 PM

## 2016-04-30 NOTE — Telephone Encounter (Signed)
Called patient and advised that we can give her a sample of Simponi since we are having problems with the patient assistance program.  Advised patient that she will need labs today and she voiced understanding.     Cyndia Skeeters 12:34 PM 04/30/2016

## 2016-04-30 NOTE — Telephone Encounter (Addendum)
Patient came to the office to sign form for Simponi patient assistance program.  She had labs drawn while in the office.    Medication Samples have been provided to the patient.  Drug name: Simponi       Strength: 50 mg        Qty: 1  LOT: [id number]  Exp.Date: 12/2016  Dosing instructions: Inject under the skin every 30 days.    The patient has been instructed regarding the correct time, dose, and frequency of taking this medication, including desired effects and most common side effects.   Janet Peterson 3:14 PM 04/30/2016

## 2016-04-30 NOTE — Telephone Encounter (Signed)
Simponi patient assistance program called back this morning and states that they reviewed patient's account and they state patient did not submit her medicare D certification form and that is why she is unable to use the patient assistance program at this time.  She will need to submit that form to be eligible.   Patient was called by the pharmacy technician, Sharyn Blitz, who informed patient that the form needs to be signed.  Patient reports she will come by this afternoon to sign the form.     Last Visit: 12/24/15 Next visit: 05/07/16 Standing Labs: 12/24/15 were normal, due for labs this month, will ask patient to get labs drawn today when she come by the office TB Gold: 07/22/15 negative  Would it be ok to provide patient with a sample of Simponi at this time until the patient assistance program is approved?

## 2016-05-01 LAB — VITAMIN D 25 HYDROXY (VIT D DEFICIENCY, FRACTURES): Vit D, 25-Hydroxy: 48 ng/mL (ref 30–100)

## 2016-05-01 NOTE — Telephone Encounter (Signed)
Mild decrease in WBC. Check labs in 2 months. Will continue to monitor

## 2016-05-01 NOTE — Progress Notes (Signed)
Vit D normal. Should take 2000 U po qd

## 2016-05-05 ENCOUNTER — Other Ambulatory Visit: Payer: Self-pay | Admitting: *Deleted

## 2016-05-05 DIAGNOSIS — Z79899 Other long term (current) drug therapy: Secondary | ICD-10-CM | POA: Insufficient documentation

## 2016-05-05 DIAGNOSIS — L409 Psoriasis, unspecified: Secondary | ICD-10-CM | POA: Insufficient documentation

## 2016-05-05 NOTE — Progress Notes (Signed)
Office Visit Note  Patient: Janet Peterson             Date of Birth: 1951-01-13           MRN: [id number]             PCP: Odette Fraction, MD Referring: Susy Frizzle, MD Visit Date: 05/07/2016 Occupation: Retired    Subjective:  Pain hands   History of Present Illness: Janet Peterson is a 65 y.o. female with history of psoriasis and psoriatic arthritis. She states she is doing fairly well she has intermittent pain in her hands which she denies any joint swelling. She has not had much pain from sacroiliitis. She does have some discomfort in the neck and thoracic region.  Activities of Daily Living:  Patient reports morning stiffness for 1 hour.   Patient Denies nocturnal pain.  Difficulty dressing/grooming: Denies Difficulty climbing stairs: Reports Difficulty getting out of chair: Denies Difficulty using hands for taps, buttons, cutlery, and/or writing: Denies   Review of Systems  Constitutional: Positive for fatigue and weight gain. Negative for night sweats, weight loss and weakness.  HENT: Negative for mouth sores, trouble swallowing, trouble swallowing, mouth dryness and nose dryness.   Eyes: Positive for dryness. Negative for pain, redness and visual disturbance.  Respiratory: Negative for cough, shortness of breath and difficulty breathing.   Cardiovascular: Negative for chest pain, palpitations, hypertension, irregular heartbeat and swelling in legs/feet.  Gastrointestinal: Positive for constipation. Negative for blood in stool and diarrhea.  Endocrine: Negative for increased urination.  Genitourinary: Negative for vaginal dryness.  Musculoskeletal: Positive for arthralgias, joint pain and morning stiffness. Negative for joint swelling, myalgias, muscle weakness, muscle tenderness and myalgias.  Skin: Negative for color change, rash, hair loss, skin tightness, ulcers and sensitivity to sunlight.  Allergic/Immunologic: Negative for susceptible to infections.    Neurological: Negative for dizziness, memory loss and night sweats.  Hematological: Negative for swollen glands.  Psychiatric/Behavioral: Positive for depressed mood. Negative for sleep disturbance. The patient is nervous/anxious.     PMFS History:  Patient Active Problem List   Diagnosis Date Noted  . H/O total knee replacement, left 05/06/2016  . DDD lumbar spine status post fusion 05/06/2016  . Psoriasis 05/05/2016  . High risk medication use 05/05/2016  . Cervical post-laminectomy syndrome 12/09/2015  . Thoracic postlaminectomy syndrome 12/09/2015  . Psoriatic arthritis ([place]) 08/23/2012  . Osteopenia 08/23/2012  . HLD (hyperlipidemia) 08/23/2012  . RLS (restless legs syndrome) 08/23/2012  . Insomnia 08/23/2012  . Fibromyalgia syndrome 08/12/2012  . Low back pain 08/12/2012  . Syncope   . PVC (premature ventricular contraction)   . OCD (obsessive compulsive disorder)   . GERD (gastroesophageal reflux disease)   . Hypertension     Past Medical History:  Diagnosis Date  . Ankylosing spondylitis ([place])   . Anxiety and depression   . Fibromyalgia   . GERD (gastroesophageal reflux disease)   . History of basal cell carcinoma excision    FACE, [place]  . History of hiatal hernia   . History of thrush   . Hyperlipidemia   . Hypertension   . Insomnia   . OCD (obsessive compulsive disorder)   . OSA (obstructive sleep apnea)    MILD AND NO CPAP SINCE SURGERY IN 2007  . Osteopenia   . PONV (postoperative nausea and vomiting)   . Psoriatic arthritis ([place])   . PVC (premature ventricular contraction)   . Rheumatoid arthritis ([place])  Family History  Problem Relation Age of Onset  . Diabetes Mother   . Heart disease Mother   . Diabetes Father   . Anuerysm Father   . Diabetes Brother   . Heart disease Brother    Past Surgical History:  Procedure Laterality Date  . BREAST SURGERY  1975   lumpectomy-- benign  . BUNIONECTOMY  1991  . CATARACT EXTRACTION W/  INTRAOCULAR LENS  IMPLANT, BILATERAL  2000  . CERVICAL FUSION  March 2013   C5 -- C7  . CESAREAN SECTION  1985  . DISTAL INTERPHALANGEAL JOINT FUSION Right 03/14/2015   Procedure: RIGHT LONG FINGER DISTAL INTERPHALANGEAL JOINT ARTHRODESIS;  Surgeon: Fred Ortmann, MD;  Location: [facility];  Service: Orthopedics;  Laterality: Right;  . EYE SURGERY  1995   rk (laser surgery), semi cornea transplant, detacted retina,  fluid removal  . KNEE ARTHROSCOPY Left 03-03-2004  . POSTERIOR VITRECTOMY RIGHT EYE AND LASER   10-27-1999  . SHOULDER SURGERY Right 1997  . SPINAL FIXATION SURGERY W/ IMPLANT  2013 rod #1//  2014  rod 2   S1 -- T10  (rod #1)//   S1 -- T4 (rod #2)  . THORACIC FUSION  03-13-2013   REMOVAL HARDWARE/  BONE GRAFT FUSION T10//  REVISION OF RODS  . TOTAL KNEE ARTHROPLASTY Left 12-14-2005  . UVULOPALATOPHARYNGOPLASTY  04-26-2006   w/  TONSILLECTOMY/  TURBINATE REDUCTIONS/  BILATERAL ANTERIOR ETHMOIDECTOMY   Social History   Social History Narrative  . No narrative on file     Objective: Vital Signs: There were no vitals taken for this visit.   Physical Exam  Constitutional: She is oriented to person, place, and time. She appears well-developed and well-nourished.  HENT:  Head: Normocephalic and atraumatic.  Eyes: Conjunctivae and EOM are normal.  Neck: Normal range of motion.  Cardiovascular: Normal rate, regular rhythm, normal heart sounds and intact distal pulses.   Pulmonary/Chest: Effort normal and breath sounds normal.  Abdominal: Soft. Bowel sounds are normal.  Lymphadenopathy:    She has no cervical adenopathy.  Neurological: She is alert and oriented to person, place, and time.  Skin: Skin is warm and dry. Capillary refill takes less than 2 seconds.  Psychiatric: She has a normal mood and affect. Her behavior is normal.  Nursing note and vitals reviewed.    Musculoskeletal Exam: C-spine limited range of motion, thoracic kyphosis, lumbar spine  limited range of motion, no SI joint tenderness. Shoulder joints, elbow joints, wrist joints, MCP joints good range of motion with no synovitis. She has DIP thickening with no synovitis. She has subluxation of DIP on her left third finger. Hip joints, knee joints, ankle joints, MTPs PIPs with good range of motion with no synovitis.  CDAI Exam: CDAI Homunculus Exam:   Tenderness:  Left hand: 2nd DIP and 3rd DIP  Joint Counts:  CDAI Tender Joint count: 0 CDAI Swollen Joint count: 0  Global Assessments:  Patient Global Assessment: 3 Provider Global Assessment: 3  CDAI Calculated Score: 6    Investigation: Findings:  07/22/2015 negative TB gold 11/25/2010 hepatitis panel negative, 12/24/2015 CBC normal, CMP normal, vitamin D 28 07/10/15 DEXA shows a T-score of -1.6 which is decreased by -8% compared to her last bone density on 06/15/2013     Imaging: No results found.  Speciality Comments: No specialty comments available.    Procedures:  No procedures performed Allergies: Latex; Amlodipine besy-benazepril hcl; Augmentin [amoxicillin-pot clavulanate]; Bextra [valdecoxib]; Lipitor [atorvastatin calcium]; Sulfa antibiotics; and Zocor [  simvastatin]   Assessment / Plan:     Visit Diagnoses: Psoriatic arthritis (HCC): Her arthritis is very well controlled with no synovitis on examination she is tolerating Simponi subcutaneous very well. We discussed possibly decreasing Arava to 10 mg a day if she tolerates it well then she will notify us and we can reduce her next prescription of Arava. She's having problems with the left third finger DIP due to subluxation. I will refer her to physical therapy for ring splint for that digit.  Psoriasis: She has no active lesions currently  High risk medication use - Simponi subcutaneous every month, Arava 20 mg daily. Labs from 04/30/2016 were within normal limits except for WBC count of 3.5. Reducing Arava to 10 mg a day will be helpful. We will  refill Arava today. H/O total knee replacement, left: Doing well  DDD cervical spine status post fusion - Dr. Cohen: Limited range of motion. 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 I will advised her to discontinue Fosamax for right now. We will repeat bone density in 2 years.  Vitamin D deficiency: In the past  Other medical problems are listed as follows for which she sees other physicians  Obsessive-compulsive disorder  Essential hypertension  Mixed hyperlipidemia   Orders Only on 04/30/2016  Component Date Value Ref Range Status  . Vit D, 25-Hydroxy 05/01/2016 48  30 - 100 ng/mL Final   Comment: Vitamin D Status           25-OH Vitamin D        Deficiency                <20 ng/mL        Insufficiency         20 - 29 ng/mL        Optimal             > or = 30 ng/mL   For 25-OH Vitamin D testing on patients on D2-supplementation and patients for whom quantitation of D2 and D3 fractions is required, the QuestAssureD 25-OH VIT D, (D2,D3), LC/MS/MS is recommended: order code 85814 (patients > 2 yrs).   Telephone on 04/30/2016  Component Date Value Ref Range Status  . WBC 04/30/2016 3.7* 3.8 - 10.8 K/uL Final  . RBC 04/30/2016 3.88  3.80 - 5.10 MIL/uL Final  . Hemoglobin 04/30/2016 11.7  11.7 - 15.5 g/dL Final  . HCT 04/30/2016 36.6  35.0 - 45.0 % Final  . MCV 04/30/2016 94.3  80.0 - 100.0 fL Final  . MCH 04/30/2016 30.2  27.0 - 33.0 pg Final  . MCHC 04/30/2016 32.0  32.0 - 36.0 g/dL Final  . RDW 04/30/2016 13.8  11.0 - 15.0 % Final  . Platelets 04/30/2016 219  140 - 400 K/uL Final  . MPV 04/30/2016 10.7  7.5 - 12.5 fL  Final  . Neutro Abs 04/30/2016 1369* 1,500 - 7,800 cells/uL Final  . Lymphs Abs 04/30/2016 1850  850 - 3,900 cells/uL Final  . Monocytes Absolute 04/30/2016 407  200 - 950 cells/uL Final  . Eosinophils Absolute 04/30/2016 37  15 - 500 cells/uL Final  . Basophils Absolute   04/30/2016 37  0 - 200 cells/uL Final  . Neutrophils Relative % 04/30/2016 37  % Final  . Lymphocytes Relative 04/30/2016 50  % Final  . Monocytes Relative 04/30/2016 11  % Final  . Eosinophils Relative 04/30/2016 1  % Final  . Basophils Relative 04/30/2016 1  % Final  . Smear Review 04/30/2016 Criteria for review not met   Final  . Sodium 04/30/2016 139  135 - 146 mmol/L Final  . Potassium 04/30/2016 4.2  3.5 - 5.3 mmol/L Final  . Chloride 04/30/2016 105  98 - 110 mmol/L Final  . CO2 04/30/2016 26  20 - 31 mmol/L Final  . Glucose, Bld 04/30/2016 94  65 - 99 mg/dL Final  . BUN 04/30/2016 7  7 - 25 mg/dL Final  . Creat 04/30/2016 0.60  0.50 - 0.99 mg/dL Final   Comment:   For patients > or = 66 years of age: The upper reference limit for Creatinine is approximately 13% higher for people identified as African-American.     . Total Bilirubin 04/30/2016 0.4  0.2 - 1.2 mg/dL Final  . Alkaline Phosphatase 04/30/2016 65  33 - 130 U/L Final  . AST 04/30/2016 18  10 - 35 U/L Final  . ALT 04/30/2016 8  6 - 29 U/L Final  . Total Protein 04/30/2016 5.5* 6.1 - 8.1 g/dL Final  . Albumin 04/30/2016 3.5* 3.6 - 5.1 g/dL Final  . Calcium 04/30/2016 9.1  8.6 - 10.4 mg/dL Final  . GFR, Est African American 04/30/2016 >89  >=60 mL/min Final  . GFR, Est Non African American 04/30/2016 >89  >=60 mL/min Final    Orders: No orders of the defined types were placed in this encounter.  No orders of the defined types were placed in this encounter.   Face-to-face time spent with patient was 30 minutes. 50% of time was spent in counseling and coordination of care.  Follow-Up Instructions: No Follow-up on file.   Bo Merino,  MD

## 2016-05-06 ENCOUNTER — Telehealth: Payer: Self-pay

## 2016-05-06 DIAGNOSIS — Z96652 Presence of left artificial knee joint: Secondary | ICD-10-CM | POA: Insufficient documentation

## 2016-05-06 DIAGNOSIS — M47816 Spondylosis without myelopathy or radiculopathy, lumbar region: Secondary | ICD-10-CM | POA: Insufficient documentation

## 2016-05-06 NOTE — Telephone Encounter (Signed)
Called [name] and [employer] Patient Assistance program to verify status of Janet Peterson's application for continuation of benefits through December 31st 2017. Application still being process.   Hopkins, [place], CPhT 1:09 PM

## 2016-05-07 ENCOUNTER — Ambulatory Visit (INDEPENDENT_AMBULATORY_CARE_PROVIDER_SITE_OTHER): Payer: Medicare Other | Admitting: Rheumatology

## 2016-05-07 ENCOUNTER — Other Ambulatory Visit: Payer: Self-pay | Admitting: Family Medicine

## 2016-05-07 ENCOUNTER — Encounter: Payer: Self-pay | Admitting: Rheumatology

## 2016-05-07 VITALS — BP 138/80 | HR 82 | Resp 16 | Ht 65.0 in | Wt 190.0 lb

## 2016-05-07 DIAGNOSIS — F429 Obsessive-compulsive disorder, unspecified: Secondary | ICD-10-CM | POA: Diagnosis not present

## 2016-05-07 DIAGNOSIS — M5030 Other cervical disc degeneration, unspecified cervical region: Secondary | ICD-10-CM

## 2016-05-07 DIAGNOSIS — Z79899 Other long term (current) drug therapy: Secondary | ICD-10-CM

## 2016-05-07 DIAGNOSIS — M47812 Spondylosis without myelopathy or radiculopathy, cervical region: Secondary | ICD-10-CM

## 2016-05-07 DIAGNOSIS — L4050 Arthropathic psoriasis, unspecified: Secondary | ICD-10-CM

## 2016-05-07 DIAGNOSIS — E782 Mixed hyperlipidemia: Secondary | ICD-10-CM

## 2016-05-07 DIAGNOSIS — E559 Vitamin D deficiency, unspecified: Secondary | ICD-10-CM | POA: Diagnosis not present

## 2016-05-07 DIAGNOSIS — L409 Psoriasis, unspecified: Secondary | ICD-10-CM

## 2016-05-07 DIAGNOSIS — M47814 Spondylosis without myelopathy or radiculopathy, thoracic region: Secondary | ICD-10-CM

## 2016-05-07 DIAGNOSIS — M797 Fibromyalgia: Secondary | ICD-10-CM | POA: Diagnosis not present

## 2016-05-07 DIAGNOSIS — M47816 Spondylosis without myelopathy or radiculopathy, lumbar region: Secondary | ICD-10-CM | POA: Diagnosis not present

## 2016-05-07 DIAGNOSIS — M8589 Other specified disorders of bone density and structure, multiple sites: Secondary | ICD-10-CM

## 2016-05-07 DIAGNOSIS — Z96652 Presence of left artificial knee joint: Secondary | ICD-10-CM | POA: Diagnosis not present

## 2016-05-07 DIAGNOSIS — I10 Essential (primary) hypertension: Secondary | ICD-10-CM | POA: Diagnosis not present

## 2016-05-07 MED ORDER — LEFLUNOMIDE 10 MG PO TABS: 10.0000 mg | ORAL_TABLET | Freq: Every day | ORAL | 0 refills | Status: DC

## 2016-05-07 NOTE — Patient Instructions (Signed)
Standing Labs We placed an order today for your standing lab work.    Please come back and get your standing labs in March and every 3 months  We have open lab Monday through Friday from 8:30-11:30 AM and 1:30-4 PM at the office of Dr. Shaili Deveshwar/Naitik Panwala, PA.   The office is located at [address], [address], [place], [place] [address] No appointment is necessary.   Labs are drawn by [facility].  You may receive a bill from [facility] for your lab work.    

## 2016-05-22 ENCOUNTER — Telehealth: Payer: Self-pay

## 2016-05-22 NOTE — Telephone Encounter (Signed)
Received a call from [employer] Patient Assistance program regarding Janet Peterson's application for Simponi. They are missing the patient declaration signature from page 2.   I spoke with patient who voiced understanding. She plans to come in to sign form on 1/2.   Hopkins, [place], CPhT

## 2016-05-26 ENCOUNTER — Telehealth: Payer: Self-pay | Admitting: Family Medicine

## 2016-05-26 ENCOUNTER — Telehealth: Payer: Self-pay | Admitting: Pharmacist

## 2016-05-26 MED ORDER — OXYCODONE-ACETAMINOPHEN 5-325 MG PO TABS: 1.0000 | ORAL_TABLET | ORAL | 0 refills | Status: DC | PRN

## 2016-05-26 NOTE — Telephone Encounter (Signed)
Okay 

## 2016-05-26 NOTE — Telephone Encounter (Signed)
Patient came by the office to sign the form that was needed for the Simponi patient assistance program.  Form was submitted to the patient assistance program.  Patient reports she is due for her Simponi injection on 05/31/16 and does not currently have the medication.   Last Visit: 05/07/16 Next visit: 10/05/16 Standing Labs: 04/30/16 - CMP normal, CBC with WBC of 3.7 TB Gold: 07/22/15 negative  Discussed with Dr. Estanislado Pandy.  Okay to give sample per Dr. Estanislado Pandy.   Medication Samples have been provided to the patient.  Drug name: Simponi       Strength: 50 mg        Qty: 1   LOT: [id number]  Exp.Date: 12/2016  Dosing instructions: Inject under the skin every 30 days.    The patient has been instructed regarding the correct time, dose, and frequency of taking this medication, including desired effects and most common side effects.    Cyndia Skeeters 2:59 PM 05/26/2016

## 2016-05-26 NOTE — Telephone Encounter (Signed)
RX printed, left up front and patient aware to pick up  

## 2016-05-26 NOTE — Telephone Encounter (Signed)
Requesting refill on percocet - Ok to refill??

## 2016-05-28 ENCOUNTER — Telehealth (INDEPENDENT_AMBULATORY_CARE_PROVIDER_SITE_OTHER): Payer: Self-pay | Admitting: Radiology

## 2016-05-28 NOTE — Telephone Encounter (Signed)
Raquel Sarna called from [facility], saying that pt called them saying she was referred by Deveshwar.  No referral is in chart.  Please fax them demographics and notes and order if pt is to be referred there.   Fax # [phone]

## 2016-05-29 ENCOUNTER — Encounter: Payer: Medicare Other | Attending: Physical Medicine & Rehabilitation

## 2016-05-29 ENCOUNTER — Other Ambulatory Visit (INDEPENDENT_AMBULATORY_CARE_PROVIDER_SITE_OTHER): Payer: Self-pay | Admitting: *Deleted

## 2016-05-29 ENCOUNTER — Telehealth: Payer: Self-pay

## 2016-05-29 ENCOUNTER — Ambulatory Visit: Payer: Medicare Other | Admitting: Physical Medicine & Rehabilitation

## 2016-05-29 DIAGNOSIS — M961 Postlaminectomy syndrome, not elsewhere classified: Secondary | ICD-10-CM | POA: Insufficient documentation

## 2016-05-29 DIAGNOSIS — M79645 Pain in left finger(s): Secondary | ICD-10-CM

## 2016-05-29 DIAGNOSIS — M791 Myalgia: Secondary | ICD-10-CM | POA: Insufficient documentation

## 2016-05-29 DIAGNOSIS — M542 Cervicalgia: Secondary | ICD-10-CM | POA: Insufficient documentation

## 2016-05-29 DIAGNOSIS — G8929 Other chronic pain: Secondary | ICD-10-CM | POA: Insufficient documentation

## 2016-05-29 DIAGNOSIS — Z981 Arthrodesis status: Secondary | ICD-10-CM | POA: Insufficient documentation

## 2016-05-29 DIAGNOSIS — M25512 Pain in left shoulder: Secondary | ICD-10-CM | POA: Insufficient documentation

## 2016-05-29 NOTE — Telephone Encounter (Signed)
I called patient, order faxed to [facility] for ring splint

## 2016-06-01 DIAGNOSIS — M25542 Pain in joints of left hand: Secondary | ICD-10-CM | POA: Diagnosis not present

## 2016-06-01 DIAGNOSIS — M19042 Primary osteoarthritis, left hand: Secondary | ICD-10-CM | POA: Diagnosis not present

## 2016-06-03 NOTE — Telephone Encounter (Signed)
Spoke with representative Janett Billow) to check the status of patients application for assistance on Simponi injections. Janett Billow states that the patients did not meet the income criteria.   Hopkins, [place], CPhT

## 2016-06-03 NOTE — Telephone Encounter (Signed)
Called patient to discuss.  Patient was agreeable to switch from Simponi SQ to [place].  Reviewed purpose, proper use, and adverse effects of Simponi Aria.  Patient denied any questions regarding the medication.  Most recent Simponi SQ injection was on 05/29/16.  First available infusion date would be 06/29/16.    Sharyn Blitz - will you run benefits verification through Lauretta Grill for this patient?

## 2016-06-03 NOTE — Telephone Encounter (Signed)
Okay to switch to [employer] if patient is in agreement.

## 2016-06-03 NOTE — Telephone Encounter (Signed)
Patient has difficulty affording SimponiSQ injections.  Patient did not qualify for the patient assistance program for Simponi SQ injections.  We previously discussed changing her to [place] on 12/24/15 and patient consented.  Simponi Donalda Ewings was not FDA approved for Psoriatic Arthritis at that time, but is now FDA approved for that indication.    Dr. Estanislado Pandy, please advise.  Do you want to consider changing patient to [place]?

## 2016-06-16 NOTE — Telephone Encounter (Signed)
Benefits review for Simponi Aria infusions was completed for patient.  Patient has medicare and [employer] Supplement plan.  No prior authorization is needed for patient's insurance.  Medicare benefits are "subject to $183 copay and 20% insurance for the administration of Simponi Aria."  AARP Medicare Supplement Plan F "covers the medicare part B deductible, coinsurance and 100% of the excess charges."  I called patient to review this information with her.  I left a voicemail asking patient to call me back.

## 2016-06-17 NOTE — Telephone Encounter (Signed)
Received return call from patient.  Reviewed the benefits review information with patient and she confirms she wants to proceed with Simponi Aria at this time.  Most recent Simponi SQ injection was on 05/29/16.  Advised patient that she should not schedule her infusion until after 06/29/16.  Patient voiced understanding.

## 2016-06-19 ENCOUNTER — Other Ambulatory Visit: Payer: Self-pay | Admitting: Radiology

## 2016-06-19 NOTE — Telephone Encounter (Signed)
Do you want to load Simponi Aria ? She has been on Simponi SQ in the past   Loading would be weeks 0/4/then every 8 weeks, If she does not need to load will put in for every 8 weeks

## 2016-06-22 ENCOUNTER — Other Ambulatory Visit: Payer: Self-pay | Admitting: Rheumatology

## 2016-06-22 DIAGNOSIS — L409 Psoriasis, unspecified: Secondary | ICD-10-CM

## 2016-06-22 DIAGNOSIS — L4050 Arthropathic psoriasis, unspecified: Secondary | ICD-10-CM

## 2016-06-22 NOTE — Telephone Encounter (Signed)
Thank you orders placed

## 2016-06-22 NOTE — Telephone Encounter (Signed)
No need to load

## 2016-06-22 NOTE — Progress Notes (Signed)
Orders placed for Simponi Aria no need to load per Dr Estanislado Pandy   TB gold negative 07/22/2015 put in order for this to be done with infusion in Feb, since it is due.  This is new start

## 2016-06-24 ENCOUNTER — Telehealth: Payer: Self-pay | Admitting: Family Medicine

## 2016-06-24 MED ORDER — OXYCODONE-ACETAMINOPHEN 5-325 MG PO TABS: 1.0000 | ORAL_TABLET | ORAL | 0 refills | Status: DC | PRN

## 2016-06-24 NOTE — Telephone Encounter (Signed)
RX printed, left up front and patient aware to pick up in AM via vm

## 2016-06-24 NOTE — Telephone Encounter (Signed)
Requesting refill on her Oxycodone - Ok to refill??        CB# [phone]

## 2016-06-24 NOTE — Telephone Encounter (Signed)
ok 

## 2016-06-30 DIAGNOSIS — F329 Major depressive disorder, single episode, unspecified: Secondary | ICD-10-CM | POA: Diagnosis not present

## 2016-07-08 ENCOUNTER — Telehealth: Payer: Self-pay | Admitting: Rheumatology

## 2016-07-08 NOTE — Telephone Encounter (Signed)
I spoke to patient.  I reminded patient that the benefits review was completed on 06/16/16 for the Simponi infusions.  Reviewed information again with patient:  Medicare benefits are "subject to $183 copay and 20% insurance for the administration of Simponi Aria."  AARP Medicare Supplement Plan F "covers the medicare part B deductible, coinsurance and 100% of the excess charges."   Informed her that the infusion orders were sent on 06/22/16.  Provided patient with the phone number of the [facility] [phone]) and advised her to call to schedule her first infusion.  Patient voiced understanding.     Elisabeth Most, Pharm.D., BCPS, CPP Clinical Pharmacist Pager: [phone] Phone: [phone] 07/08/2016 3:28 PM

## 2016-07-08 NOTE — Telephone Encounter (Signed)
Patient called and inquired as to whether her symphoni injection has been approved.  Cb#[phone].  Thank you.

## 2016-07-20 ENCOUNTER — Telehealth: Payer: Self-pay | Admitting: Rheumatology

## 2016-07-20 ENCOUNTER — Telehealth: Payer: Self-pay | Admitting: Radiology

## 2016-07-20 ENCOUNTER — Ambulatory Visit (HOSPITAL_COMMUNITY)
Admission: RE | Admit: 2016-07-20 | Discharge: 2016-07-20 | Disposition: A | Discharge status: Home / Self Care | Payer: Medicare Other | Source: Ambulatory Visit | Attending: Rheumatology | Admitting: Rheumatology

## 2016-07-20 NOTE — Telephone Encounter (Signed)
Erasmo Downer called about patients infusion she has presented to infuse and is on ABX for dental infection I have advised not to infuse until she has recovered from upcoming dental procedure AND finished her ABX to you FYI

## 2016-07-20 NOTE — Telephone Encounter (Signed)
Amy L spoke with Erasmo Downer and advised not to infuse.

## 2016-07-20 NOTE — Progress Notes (Signed)
Pt has come for her first dose of Iv Simponi Aria.  When asking her MCA questions today she stated that she has just a course of Amoxicillin and has a week left.  I have called Dr Estanislado Pandy and am awaiting further instructions

## 2016-07-20 NOTE — Telephone Encounter (Signed)
Renee from [facility] health called stating patient Janet Peterson is there requesting to fill her [place].  She just started antibiotics.  Renee wants to clarify if the patient is suppose to wait until 10 days after she stops taking the antibiotics.  [phone].  Thank you.

## 2016-07-20 NOTE — Progress Notes (Signed)
Spoke with Janet Peterson at Dr Arlean Hopping office and she stated to tell the pt she needed to be fully recovered from her root canal and completely done with her antibiotics before receiving her simponi aria.  Pt verbalized understanding and rescheduled based on when she should be finished with her antibiotics.

## 2016-08-05 ENCOUNTER — Other Ambulatory Visit: Payer: Self-pay | Admitting: Family Medicine

## 2016-08-05 NOTE — Telephone Encounter (Signed)
Patient requesting a refill on Oxycodone - Ok to refill??        

## 2016-08-06 MED ORDER — OXYCODONE-ACETAMINOPHEN 5-325 MG PO TABS: 1.0000 | ORAL_TABLET | ORAL | 0 refills | Status: DC | PRN

## 2016-08-06 NOTE — Telephone Encounter (Signed)
Prescription printed and patient made aware to come to office to pick up after 2pm on 08/06/2016.

## 2016-08-06 NOTE — Telephone Encounter (Signed)
ok 

## 2016-08-07 ENCOUNTER — Other Ambulatory Visit: Payer: Self-pay | Admitting: Radiology

## 2016-08-07 ENCOUNTER — Other Ambulatory Visit: Payer: Self-pay | Admitting: *Deleted

## 2016-08-07 DIAGNOSIS — Z79899 Other long term (current) drug therapy: Secondary | ICD-10-CM

## 2016-08-07 LAB — COMPLETE METABOLIC PANEL WITH GFR
ALT: 8 U/L (ref 6–29)
AST: 13 U/L (ref 10–35)
Albumin: 4 g/dL (ref 3.6–5.1)
Alkaline Phosphatase: 83 U/L (ref 33–130)
BUN: 14 mg/dL (ref 7–25)
CO2: 27 mmol/L (ref 20–31)
Calcium: 9.7 mg/dL (ref 8.6–10.4)
Chloride: 97 mmol/L — ABNORMAL LOW (ref 98–110)
Creat: 0.56 mg/dL (ref 0.50–0.99)
GFR, Est African American: >89 mL/min (ref >=60)
GFR, Est Non African American: >89 mL/min (ref >=60)
Glucose, Bld: 73 mg/dL (ref 65–99)
Potassium: 4 mmol/L (ref 3.5–5.3)
Sodium: 134 mmol/L — ABNORMAL LOW (ref 135–146)
Total Bilirubin: 0.3 mg/dL (ref 0.2–1.2)
Total Protein: 6.5 g/dL (ref 6.1–8.1)

## 2016-08-07 LAB — CBC WITH DIFFERENTIAL/PLATELET
Basophils Absolute: 71 cells/uL (ref 0–200)
Basophils Relative: 1 %
Eosinophils Absolute: 71 cells/uL (ref 15–500)
Eosinophils Relative: 1 %
HCT: 39.3 % (ref 35.0–45.0)
Hemoglobin: 13.1 g/dL (ref 11.7–15.5)
Lymphocytes Relative: 39 %
Lymphs Abs: 2769 cells/uL (ref 850–3900)
MCH: 31.3 pg (ref 27.0–33.0)
MCHC: 33.3 g/dL (ref 32.0–36.0)
MCV: 93.8 fL (ref 80.0–100.0)
MPV: 10.6 fL (ref 7.5–12.5)
Monocytes Absolute: 781 cells/uL (ref 200–950)
Monocytes Relative: 11 %
Neutro Abs: 3408 cells/uL (ref 1500–7800)
Neutrophils Relative %: 48 %
Platelets: 246 10*3/uL (ref 140–400)
RBC: 4.19 MIL/uL (ref 3.80–5.10)
RDW: 13.6 % (ref 11.0–15.0)
WBC: 7.1 10*3/uL (ref 3.8–10.8)

## 2016-08-09 NOTE — Progress Notes (Signed)
Labs normal.

## 2016-08-13 ENCOUNTER — Other Ambulatory Visit (HOSPITAL_COMMUNITY): Payer: Self-pay | Admitting: *Deleted

## 2016-08-14 ENCOUNTER — Other Ambulatory Visit: Payer: Self-pay | Admitting: Family Medicine

## 2016-08-14 ENCOUNTER — Ambulatory Visit (HOSPITAL_COMMUNITY)
Admission: RE | Admit: 2016-08-14 | Discharge: 2016-08-14 | Disposition: A | Discharge status: Home / Self Care | Payer: Medicare Other | Source: Ambulatory Visit | Attending: Rheumatology | Admitting: Rheumatology

## 2016-08-14 ENCOUNTER — Telehealth: Payer: Self-pay | Admitting: Radiology

## 2016-08-14 DIAGNOSIS — L4050 Arthropathic psoriasis, unspecified: Secondary | ICD-10-CM | POA: Insufficient documentation

## 2016-08-14 DIAGNOSIS — L409 Psoriasis, unspecified: Secondary | ICD-10-CM

## 2016-08-14 MED ORDER — ACETAMINOPHEN 325 MG PO TABS
ORAL_TABLET | ORAL | Status: AC
  Administered 2016-08-14: 650 mg via ORAL
  Filled 2016-08-14: qty 2

## 2016-08-14 MED ORDER — DIPHENHYDRAMINE HCL 25 MG PO CAPS
25.0000 mg | ORAL_CAPSULE | ORAL | Status: DC
  Administered 2016-08-14: 25 mg via ORAL

## 2016-08-14 MED ORDER — DIPHENHYDRAMINE HCL 25 MG PO CAPS
ORAL_CAPSULE | ORAL | Status: AC
  Administered 2016-08-14: 25 mg via ORAL
  Filled 2016-08-14: qty 1

## 2016-08-14 MED ORDER — SODIUM CHLORIDE 0.9 % IV SOLN
Freq: Once | INTRAVENOUS | Status: AC
  Administered 2016-08-14: 14:00:00 via INTRAVENOUS

## 2016-08-14 MED ORDER — SODIUM CHLORIDE 0.9 % IV SOLN
2.0000 mg/kg | INTRAVENOUS | Status: DC
  Administered 2016-08-14: 172.5 mg via INTRAVENOUS
  Filled 2016-08-14: qty 13.8

## 2016-08-14 MED ORDER — ACETAMINOPHEN 325 MG PO TABS
650.0000 mg | ORAL_TABLET | ORAL | Status: DC
  Administered 2016-08-14: 650 mg via ORAL

## 2016-08-14 NOTE — Telephone Encounter (Signed)
Medication refilled per protocol. 

## 2016-08-14 NOTE — Telephone Encounter (Signed)
I spoke to Janet Peterson at the hospital today, she has asked if patient needs TB gold today, I have verified that yes, this is due now. She will make sure this is done today.

## 2016-08-19 LAB — QUANTIFERON IN TUBE
QFT TB AG MINUS NIL VALUE: 0.02 IU/mL
QUANTIFERON MITOGEN VALUE: 0.06 IU/mL
QUANTIFERON TB AG VALUE: 0.08 IU/mL
QUANTIFERON TB GOLD: UNDETERMINED
Quantiferon Nil Value: 0.06 IU/mL

## 2016-08-19 LAB — QUANTIFERON TB GOLD ASSAY (BLOOD)

## 2016-08-19 NOTE — Progress Notes (Signed)
TB neg

## 2016-08-28 ENCOUNTER — Other Ambulatory Visit: Payer: Self-pay | Admitting: Family Medicine

## 2016-08-28 NOTE — Telephone Encounter (Signed)
Refill appropriate 

## 2016-08-29 ENCOUNTER — Other Ambulatory Visit: Payer: Self-pay | Admitting: Rheumatology

## 2016-08-31 NOTE — Telephone Encounter (Signed)
Last Visit: 05/07/16 Next Visit: 10/05/16 Labs: 08/07/16 WNL  Okay to refill Arava?

## 2016-08-31 NOTE — Telephone Encounter (Signed)
ok 

## 2016-09-10 ENCOUNTER — Encounter: Payer: Self-pay | Admitting: Family Medicine

## 2016-09-10 ENCOUNTER — Ambulatory Visit (INDEPENDENT_AMBULATORY_CARE_PROVIDER_SITE_OTHER): Payer: Medicare Other | Admitting: Family Medicine

## 2016-09-10 VITALS — BP 110/80 | HR 74 | Temp 98.1°F | Resp 16 | Ht 64.0 in | Wt 193.0 lb

## 2016-09-10 DIAGNOSIS — G2581 Restless legs syndrome: Secondary | ICD-10-CM

## 2016-09-10 DIAGNOSIS — B372 Candidiasis of skin and nail: Secondary | ICD-10-CM

## 2016-09-10 DIAGNOSIS — M47816 Spondylosis without myelopathy or radiculopathy, lumbar region: Secondary | ICD-10-CM

## 2016-09-10 DIAGNOSIS — K219 Gastro-esophageal reflux disease without esophagitis: Secondary | ICD-10-CM

## 2016-09-10 DIAGNOSIS — Z96652 Presence of left artificial knee joint: Secondary | ICD-10-CM

## 2016-09-10 DIAGNOSIS — M545 Low back pain: Secondary | ICD-10-CM | POA: Diagnosis not present

## 2016-09-10 DIAGNOSIS — Z23 Encounter for immunization: Secondary | ICD-10-CM

## 2016-09-10 DIAGNOSIS — L4050 Arthropathic psoriasis, unspecified: Secondary | ICD-10-CM

## 2016-09-10 DIAGNOSIS — I10 Essential (primary) hypertension: Secondary | ICD-10-CM

## 2016-09-10 DIAGNOSIS — G8929 Other chronic pain: Secondary | ICD-10-CM

## 2016-09-10 DIAGNOSIS — M961 Postlaminectomy syndrome, not elsewhere classified: Secondary | ICD-10-CM

## 2016-09-10 DIAGNOSIS — M8589 Other specified disorders of bone density and structure, multiple sites: Secondary | ICD-10-CM | POA: Diagnosis not present

## 2016-09-10 DIAGNOSIS — Z0000 Encounter for general adult medical examination without abnormal findings: Secondary | ICD-10-CM

## 2016-09-10 MED ORDER — HYDROXYZINE HCL 50 MG PO TABS: ORAL_TABLET | ORAL | 1 refills | Status: DC

## 2016-09-10 NOTE — Progress Notes (Signed)
Subjective:    Patient ID: Janet Peterson, female    DOB: 1951/04/08, 66 y.o.   MRN: [id number]  HPI She is here today for complete physical exam. Last colonoscopy was 5 years ago however she is on a every 5 year screening pattern. Patient is to schedule that later this year with her gastroenterologist.  Mammogram was performed in September 2017 and is not due again until this coming September. Due to her age, she no longer requires a Pap smear.  In density test was performed by her rheumatologist. Rheumatology discontinued Fosamax. They plan to recheck a bone density in 2-3 years to monitor. She is due for hepatitis C screening. She is also due for a fasting lipid panel. CBC and CMP were recently checked in March and was found to be normal. Past Medical History:  Diagnosis Date  . Ankylosing spondylitis ([place])   . Anxiety and depression   . Fibromyalgia   . GERD (gastroesophageal reflux disease)   . History of basal cell carcinoma excision    FACE, [place]  . History of hiatal hernia   . History of thrush   . Hyperlipidemia   . Hypertension   . Insomnia   . OCD (obsessive compulsive disorder)   . OSA (obstructive sleep apnea)    MILD AND NO CPAP SINCE SURGERY IN 2007  . Osteopenia   . PONV (postoperative nausea and vomiting)   . Psoriatic arthritis ([place])   . PVC (premature ventricular contraction)   . Rheumatoid arthritis [facility])    Past Surgical History:  Procedure Laterality Date  . BREAST SURGERY  1975   lumpectomy-- benign  . BUNIONECTOMY  1991  . CATARACT EXTRACTION W/ INTRAOCULAR LENS  IMPLANT, BILATERAL  2000  . CERVICAL FUSION  March 2013   C5 -- C7  . CESAREAN SECTION  1985  . DISTAL INTERPHALANGEAL JOINT FUSION Right 03/14/2015   Procedure: RIGHT LONG FINGER DISTAL INTERPHALANGEAL JOINT ARTHRODESIS;  Surgeon: Iran Planas, MD;  Location: [place];  Service: Orthopedics;  Laterality: Right;  . EYE SURGERY  1995   rk (laser surgery), semi cornea  transplant, detacted retina,  fluid removal  . KNEE ARTHROSCOPY Left 03-03-2004  . POSTERIOR VITRECTOMY RIGHT EYE AND LASER   10-27-1999  . SHOULDER SURGERY Right 1997  . SPINAL FIXATION SURGERY W/ IMPLANT  2013 rod #1//  2014  rod 2   S1 -- T10  (rod #1)//   S1 -- T4 (rod #2)  . THORACIC FUSION  03-13-2013   REMOVAL HARDWARE/  BONE GRAFT FUSION T10//  REVISION OF RODS  . TOTAL KNEE ARTHROPLASTY Left 12-14-2005  . UVULOPALATOPHARYNGOPLASTY  04-26-2006   w/  TONSILLECTOMY/  TURBINATE REDUCTIONS/  BILATERAL ANTERIOR ETHMOIDECTOMY   Current Outpatient Prescriptions on File Prior to Visit  Medication Sig Dispense Refill  . baclofen (LIORESAL) 10 MG tablet Take 10 mg by mouth 2 (two) times daily. Reported on 12/09/2015    . clonazePAM (KLONOPIN) 1 MG tablet TAKE 1 TABLET BY MOUTH EVERY DAY AS NEEDED FOR ANXIETY  1  . DEXILANT 60 MG capsule TAKE 1 CAPSULE BY MOUTH DAILY. 90 capsule 0  . ezetimibe (ZETIA) 10 MG tablet Take 1 tablet (10 mg total) by mouth daily. 90 tablet 0  . furosemide (LASIX) 40 MG tablet TAKE 1 TABLET (40 MG TOTAL) BY MOUTH DAILY. 30 tablet 9  . hydrOXYzine (ATARAX/VISTARIL) 50 MG tablet Take 1-2 up to 3 times a day as needed for itching.  30 tablet 1  . KLOR-CON M20 20 MEQ tablet TAKE 1 TABLET (20 MEQ TOTAL) BY MOUTH DAILY. 30 tablet 6  . leflunomide (ARAVA) 10 MG tablet TAKE 1 TABLET (10 MG TOTAL) BY MOUTH DAILY. 90 tablet 0  . metoprolol succinate (TOPROL-XL) 50 MG 24 hr tablet TAKE 1 TABLET (50 MG TOTAL) BY MOUTH DAILY. TAKE WITH OR IMMEDIATELY FOLLOWING A MEAL. 90 tablet 3  . oxyCODONE-acetaminophen (PERCOCET) 5-325 MG tablet Take 1 tablet by mouth every 4 (four) hours as needed. 60 tablet 0  . pravastatin (PRAVACHOL) 20 MG tablet TAKE 1 TABLET BY MOUTH DAILY (Patient taking differently: TAKE 1 TABLET BY MOUTH DAILY--- TAKES IN PM) 90 tablet 3  . sucralfate (CARAFATE) 1 G tablet Take 1 tablet (1 g total) by mouth 4 (four) times daily -  with meals and at bedtime. (Patient  taking differently: Take 1 g by mouth 3 (three) times daily as needed. PRN) 120 tablet 3  . SUMAtriptan (IMITREX) 50 MG tablet TAKE 1 TABLET BY MOUTH ONCE DAILY AS NEEDED FOR HEADACHES 30 tablet 3  . traZODone (DESYREL) 100 MG tablet Take 200 mg by mouth at bedtime.    . valsartan (DIOVAN) 160 MG tablet TAKE 1 TABLET BY MOUTH DAILY. 90 tablet 3  . [DISCONTINUED] Golimumab 50 MG/0.5ML SOAJ Inject 0.5 mLs into the skin every 30 (thirty) days. 1 Syringe 0   No current facility-administered medications on file prior to visit.    Allergies  Allergen Reactions  . Latex Rash    And Mouth sores  . Amlodipine Besy-Benazepril Hcl Other (See Comments)    COUGH  . Augmentin [Amoxicillin-Pot Clavulanate] Diarrhea and Nausea And Vomiting  . Bextra [Valdecoxib] Diarrhea and Nausea And Vomiting    REFLUX  . Lipitor [Atorvastatin Calcium] Other (See Comments)    MYALGIAS  . Sulfa Antibiotics Nausea And Vomiting    CRAMPING  . Zocor [Simvastatin] Other (See Comments)    MYALGIA   Social History   Social History  . Marital status: Married    Spouse name: N/A  . Number of children: N/A  . Years of education: N/A   Occupational History  . Not on file.   Social History Main Topics  . Smoking status: Former Smoker    Packs/day: 0.50    Years: 10.00    Types: Cigarettes    Quit date: 03/07/1995  . Smokeless tobacco: Never Used  . Alcohol use Yes     Comment: rare  . Drug use: No  . Sexual activity: Not on file   Other Topics Concern  . Not on file   Social History Narrative  . No narrative on file   Family History  Problem Relation Age of Onset  . Diabetes Mother   . Heart disease Mother   . Diabetes Father   . Anuerysm Father   . Diabetes Brother   . Heart disease Brother      Review of Systems  All other systems reviewed and are negative.      Objective:   Physical Exam  Constitutional: She is oriented to person, place, and time. She appears well-developed and  well-nourished. No distress.  HENT:  Head: Normocephalic and atraumatic.  Right Ear: External ear normal.  Left Ear: External ear normal.  Nose: Nose normal.  Mouth/Throat: Oropharynx is clear and moist. No oropharyngeal exudate.  Eyes: Conjunctivae and EOM are normal. Pupils are equal, round, and reactive to light. Right eye exhibits no discharge. Left eye exhibits no discharge.  No scleral icterus.  Neck: Neck supple. No JVD present. No tracheal deviation present. No thyromegaly present.  Cardiovascular: Normal rate, regular rhythm, normal heart sounds and intact distal pulses.  Exam reveals no gallop and no friction rub.   No murmur heard. Pulmonary/Chest: Effort normal and breath sounds normal. No stridor. No respiratory distress. She has no wheezes. She has no rales. She exhibits no tenderness.  Abdominal: Soft. Bowel sounds are normal. She exhibits no distension and no mass. There is no tenderness. There is no rebound and no guarding.  Musculoskeletal: Normal range of motion. She exhibits tenderness and deformity. She exhibits no edema.  Lymphadenopathy:    She has no cervical adenopathy.  Neurological: She is alert and oriented to person, place, and time. She has normal reflexes. She displays normal reflexes. No cranial nerve deficit. Coordination normal.  Skin: Skin is warm. No rash noted. She is not diaphoretic. No erythema. No pallor.  Psychiatric: She has a normal mood and affect. Her behavior is normal. Judgment and thought content normal.  Vitals reviewed.         Assessment & Plan:  Benign essential HTN  Chronic midline low back pain, with sciatica presence unspecified  Gastroesophageal reflux disease without esophagitis  Psoriatic arthritis (HCC)  DDD lumbar spine status post fusion  Osteopenia of multiple sites  RLS (restless legs syndrome)  Cervical post-laminectomy syndrome  Thoracic postlaminectomy syndrome  H/O total knee replacement, left  General  medical exam  Candidiasis of skin - Plan: hydrOXYzine (ATARAX/VISTARIL) 50 MG tablet  Need for prophylactic vaccination against Streptococcus pneumoniae (pneumococcus) - Plan: Pneumococcal conjugate vaccine 13-valent IM  Blood pressure is well controlled. Return fasting for a fasting lipid panel. Goal LDL cholesterol is less than 130. Mammogram is up-to-date. Patient will schedule a colonoscopy later this year with her gastroenterologist. Bone density is up-to-date.  Pap smear is no longer required if she is greater than 97 years old. Patient requests refill for hydroxyzine which she uses sparingly for itching triggered sometimes by her pain medication. Immunizations are up-to-date except for Prevnar 13 which she received today. She declined a tetanus shot. We also discussed the shingles vaccine but she will check on the price prior to receiving this.

## 2016-09-25 ENCOUNTER — Other Ambulatory Visit: Payer: Self-pay | Admitting: Radiology

## 2016-09-25 DIAGNOSIS — L4050 Arthropathic psoriasis, unspecified: Secondary | ICD-10-CM

## 2016-09-25 MED ORDER — ACETAMINOPHEN 325 MG PO TABS
650.0000 mg | ORAL_TABLET | ORAL | Status: DC
Start: 2016-09-25 — End: 2016-09-25

## 2016-09-25 MED ORDER — DIPHENHYDRAMINE HCL 25 MG PO CAPS
25.0000 mg | ORAL_CAPSULE | ORAL | Status: DC
Start: 2016-09-25 — End: 2016-09-25

## 2016-09-25 MED ORDER — SODIUM CHLORIDE 0.9 % IV SOLN: 2.0000 mg/kg | INTRAVENOUS | Status: DC

## 2016-09-25 NOTE — Progress Notes (Signed)
Simponi Aria Infusion orders updated today including CBC CMP Tylenol and Benadryl appointments are up to date and follow up appointment is scheduled TB gold is due on 07/2017

## 2016-09-29 DIAGNOSIS — F329 Major depressive disorder, single episode, unspecified: Secondary | ICD-10-CM | POA: Diagnosis not present

## 2016-10-05 ENCOUNTER — Telehealth: Payer: Self-pay | Admitting: Family Medicine

## 2016-10-05 ENCOUNTER — Ambulatory Visit: Payer: Medicare Other | Admitting: Rheumatology

## 2016-10-05 NOTE — Telephone Encounter (Signed)
Patient requesting a refill on Oxycodone - Ok to refill??        

## 2016-10-06 MED ORDER — OXYCODONE-ACETAMINOPHEN 5-325 MG PO TABS: 1.0000 | ORAL_TABLET | ORAL | 0 refills | Status: DC | PRN

## 2016-10-06 NOTE — Telephone Encounter (Signed)
ok 

## 2016-10-06 NOTE — Telephone Encounter (Signed)
RX printed, left up front and patient's husband aware to pick up after 2 pm

## 2016-10-08 ENCOUNTER — Telehealth: Payer: Self-pay | Admitting: Rheumatology

## 2016-10-08 ENCOUNTER — Other Ambulatory Visit: Payer: Self-pay | Admitting: Radiology

## 2016-10-08 DIAGNOSIS — L4050 Arthropathic psoriasis, unspecified: Secondary | ICD-10-CM

## 2016-10-08 DIAGNOSIS — Z85828 Personal history of other malignant neoplasm of skin: Secondary | ICD-10-CM | POA: Diagnosis not present

## 2016-10-08 DIAGNOSIS — D2239 Melanocytic nevi of other parts of face: Secondary | ICD-10-CM | POA: Diagnosis not present

## 2016-10-08 DIAGNOSIS — L720 Epidermal cyst: Secondary | ICD-10-CM | POA: Diagnosis not present

## 2016-10-08 NOTE — Telephone Encounter (Signed)
Janet Peterson at [facility] short stay is requesting orders be placed for Simponi. Patient has scheduled appointment for tomorrow (10/09/16).   Cb# [phone]

## 2016-10-08 NOTE — Telephone Encounter (Signed)
Infusion orders updated today including CBC CMP Tylenol and Benadryl appointments are up to date and follow up appointment is scheduled TB gold is due on 07/2017

## 2016-10-08 NOTE — Progress Notes (Signed)
TB gold negative 08/14/16  Infusion orders updated today including CBC CMP Tylenol and Benadryl appointments are up to date and follow up appointment is scheduled TB gold is due on 07/2017

## 2016-10-09 ENCOUNTER — Ambulatory Visit (HOSPITAL_COMMUNITY)
Admission: RE | Admit: 2016-10-09 | Discharge: 2016-10-09 | Disposition: A | Discharge status: Home / Self Care | Payer: Medicare Other | Source: Ambulatory Visit | Attending: Rheumatology | Admitting: Rheumatology

## 2016-10-09 DIAGNOSIS — L4050 Arthropathic psoriasis, unspecified: Secondary | ICD-10-CM | POA: Insufficient documentation

## 2016-10-09 LAB — COMPREHENSIVE METABOLIC PANEL
ALT: 12 U/L — ABNORMAL LOW (ref 14–54)
AST: 20 U/L (ref 15–41)
Albumin: 3.7 g/dL (ref 3.5–5.0)
Alkaline Phosphatase: 77 U/L (ref 38–126)
Anion gap: 4 — ABNORMAL LOW (ref 5–15)
BUN: 7 mg/dL (ref 6–20)
CO2: 28 mmol/L (ref 22–32)
Calcium: 9.7 mg/dL (ref 8.9–10.3)
Chloride: 105 mmol/L (ref 101–111)
Creatinine, Ser: 0.7 mg/dL (ref 0.44–1.00)
GFR calc Af Amer: >60 mL/min (ref >60)
GFR calc non Af Amer: >60 mL/min (ref >60)
Glucose, Bld: 82 mg/dL (ref 65–99)
Potassium: 4.8 mmol/L (ref 3.5–5.1)
Sodium: 137 mmol/L (ref 135–145)
Total Bilirubin: 0.4 mg/dL (ref 0.3–1.2)
Total Protein: 6.3 g/dL — ABNORMAL LOW (ref 6.5–8.1)

## 2016-10-09 LAB — CBC WITH DIFFERENTIAL/PLATELET
Basophils Absolute: 0 10*3/uL (ref 0.0–0.1)
Basophils Relative: 1 %
Eosinophils Absolute: 0.1 10*3/uL (ref 0.0–0.7)
Eosinophils Relative: 1 %
HCT: 37 % (ref 36.0–46.0)
Hemoglobin: 12 g/dL (ref 12.0–15.0)
Lymphocytes Relative: 42 %
Lymphs Abs: 2.3 10*3/uL (ref 0.7–4.0)
MCH: 30.5 pg (ref 26.0–34.0)
MCHC: 32.4 g/dL (ref 30.0–36.0)
MCV: 93.9 fL (ref 78.0–100.0)
Monocytes Absolute: 0.7 10*3/uL (ref 0.1–1.0)
Monocytes Relative: 14 %
Neutro Abs: 2.3 10*3/uL (ref 1.7–7.7)
Neutrophils Relative %: 42 %
Platelets: 208 10*3/uL (ref 150–400)
RBC: 3.94 MIL/uL (ref 3.87–5.11)
RDW: 13.9 % (ref 11.5–15.5)
WBC: 5.4 10*3/uL (ref 4.0–10.5)

## 2016-10-09 MED ORDER — DIPHENHYDRAMINE HCL 25 MG PO CAPS: 25.0000 mg | ORAL_CAPSULE | ORAL | Status: DC

## 2016-10-09 MED ORDER — SODIUM CHLORIDE 0.9 % IV SOLN
2.0000 mg/kg | INTRAVENOUS | Status: DC
  Administered 2016-10-09: 176.25 mg via INTRAVENOUS
  Filled 2016-10-09: qty 14.1

## 2016-10-09 MED ORDER — ACETAMINOPHEN 325 MG PO TABS: 650.0000 mg | ORAL_TABLET | ORAL | Status: DC

## 2016-10-14 ENCOUNTER — Telehealth: Payer: Self-pay | Admitting: Rheumatology

## 2016-10-14 NOTE — Telephone Encounter (Signed)
Patient would like to change her Ariva back to 20mg  because her walking is getting worse.

## 2016-10-14 NOTE — Telephone Encounter (Signed)
"  Psoriatic arthritis (Janet Peterson): Her arthritis is very well controlled with no synovitis on examination she is tolerating Simponi subcutaneous very well. We discussed possibly decreasing Arava to 10 mg a day if she tolerates it well then she will notify us and we can reduce her next prescription of [place].   High risk medication use - Simponi subcutaneous every month, Arava 20 mg daily. Labs from 04/30/2016 were within normal limits except for WBC count of 3.5. Reducing Arava to 10 mg a day will be helpful. We will refill Arava today.l"  Patient switched to Simponi Aria on 07/20/16. Asking to increase Arava back to 20 mg. Labs 10/09/16 WNL. Follow up visit 11/03/16. Is this okay?

## 2016-10-14 NOTE — Telephone Encounter (Signed)
Ok to increase Arava to 20 mg po qd. She should have labs in 1 month after the increased dose.

## 2016-10-15 ENCOUNTER — Other Ambulatory Visit: Payer: Medicare Other

## 2016-10-15 DIAGNOSIS — Z79899 Other long term (current) drug therapy: Secondary | ICD-10-CM

## 2016-10-15 DIAGNOSIS — E7849 Other hyperlipidemia: Secondary | ICD-10-CM

## 2016-10-15 DIAGNOSIS — E784 Other hyperlipidemia: Secondary | ICD-10-CM | POA: Diagnosis not present

## 2016-10-15 LAB — LIPID PANEL
Cholesterol: 182 mg/dL (ref <200)
HDL: 54 mg/dL (ref >50)
LDL Cholesterol: 104 mg/dL — ABNORMAL HIGH (ref <100)
Total CHOL/HDL Ratio: 3.4 Ratio (ref <5.0)
Triglycerides: 121 mg/dL (ref <150)
VLDL: 24 mg/dL (ref <30)

## 2016-10-15 NOTE — Telephone Encounter (Signed)
Patient advised she can increase Arava to 20 mg and she will need labs in 1 month. Patient verbalized understanding.

## 2016-11-03 ENCOUNTER — Ambulatory Visit: Payer: Medicare Other | Admitting: Rheumatology

## 2016-11-12 DIAGNOSIS — M5030 Other cervical disc degeneration, unspecified cervical region: Secondary | ICD-10-CM | POA: Insufficient documentation

## 2016-11-12 NOTE — Progress Notes (Signed)
Office Visit Note  Patient: Janet Peterson             Date of Birth: 06/11/1950           MRN: [id number]             PCP: Susy Frizzle, MD Referring: Susy Frizzle, MD Visit Date: 11/13/2016 Occupation: @GUAROCC @    Subjective:  Stiffness.   History of Present Illness: Janet Peterson is a 66 y.o. female with history of psoriatic arthritis and psoriasis. She states she's having problems with mobility due to pain and discomfort in her upper and lower back. She denies any joint pain or joint swelling. Psoriasis is well controlled. She's been tolerating Simponi Aria and [place]. The pain is mostly in her upper back. She also gets flares of fibromyalgia especially in the trapezius area.  Activities of Daily Living:  Patient reports morning stiffness for 1 hour.   Patient Denies nocturnal pain.  Difficulty dressing/grooming: Denies Difficulty climbing stairs: Reports Difficulty getting out of chair: Reports Difficulty using hands for taps, buttons, cutlery, and/or writing: Reports   Review of Systems  Constitutional: Positive for fatigue. Negative for night sweats, weight gain, weight loss and weakness.  HENT: Negative for mouth sores, trouble swallowing, trouble swallowing, mouth dryness and nose dryness.   Eyes: Positive for dryness. Negative for pain, redness and visual disturbance.  Respiratory: Negative for cough, shortness of breath and difficulty breathing.   Cardiovascular: Negative for chest pain, palpitations, hypertension, irregular heartbeat and swelling in legs/feet.  Gastrointestinal: Negative for blood in stool, constipation and diarrhea.  Endocrine: Negative for increased urination.  Genitourinary: Negative for vaginal dryness.  Musculoskeletal: Positive for arthralgias, joint pain, myalgias, morning stiffness and myalgias. Negative for joint swelling, muscle weakness and muscle tenderness.  Skin: Negative for color change, rash, hair loss, skin tightness, ulcers  and sensitivity to sunlight.  Allergic/Immunologic: Negative for susceptible to infections.  Neurological: Negative for dizziness, memory loss and night sweats.  Hematological: Negative for swollen glands.  Psychiatric/Behavioral: Positive for depressed mood and sleep disturbance. The patient is nervous/anxious.     PMFS History:  Patient Active Problem List   Diagnosis Date Noted  . DDD (degenerative disc disease), cervical s/p fusion 11/12/2016  . H/O total knee replacement, left 05/06/2016  . DDD lumbar spine status post fusion 05/06/2016  . Psoriasis 05/05/2016  . High risk medication use 05/05/2016  . Cervical post-laminectomy syndrome 12/09/2015  . Thoracic postlaminectomy syndrome 12/09/2015  . Psoriatic arthritis ([place]) 08/23/2012  . Osteopenia 08/23/2012  . HLD (hyperlipidemia) 08/23/2012  . RLS (restless legs syndrome) 08/23/2012  . Insomnia 08/23/2012  . Fibromyalgia syndrome 08/12/2012  . Low back pain 08/12/2012  . Syncope   . PVC (premature ventricular contraction)   . OCD (obsessive compulsive disorder)   . GERD (gastroesophageal reflux disease)   . Hypertension     Past Medical History:  Diagnosis Date  . Ankylosing spondylitis ([place])   . Anxiety and depression   . Fibromyalgia   . GERD (gastroesophageal reflux disease)   . History of basal cell carcinoma excision    FACE, [place]  . History of hiatal hernia   . History of thrush   . Hyperlipidemia   . Hypertension   . Insomnia   . OCD (obsessive compulsive disorder)   . OSA (obstructive sleep apnea)    MILD AND NO CPAP SINCE SURGERY IN 2007  . Osteopenia   . PONV (postoperative nausea and  vomiting)   . Psoriatic arthritis ([place])   . PVC (premature ventricular contraction)   . Rheumatoid arthritis ([place])     Family History  Problem Relation Age of Onset  . Diabetes Mother   . Heart disease Mother   . Diabetes Father   . Anuerysm Father   . Diabetes Brother   . Heart disease Brother     Past Surgical History:  Procedure Laterality Date  . BREAST SURGERY  1975   lumpectomy-- benign  . BUNIONECTOMY  1991  . CATARACT EXTRACTION W/ INTRAOCULAR LENS  IMPLANT, BILATERAL  2000  . CERVICAL FUSION  March 2013   C5 -- C7  . CESAREAN SECTION  1985  . DISTAL INTERPHALANGEAL JOINT FUSION Right 03/14/2015   Procedure: RIGHT LONG FINGER DISTAL INTERPHALANGEAL JOINT ARTHRODESIS;  Surgeon: Iran Planas, MD;  Location: [place];  Service: Orthopedics;  Laterality: Right;  . EYE SURGERY  1995   rk (laser surgery), semi cornea transplant, detacted retina,  fluid removal  . KNEE ARTHROSCOPY Left 03-03-2004  . POSTERIOR VITRECTOMY RIGHT EYE AND LASER   10-27-1999  . SHOULDER SURGERY Right 1997  . SPINAL FIXATION SURGERY W/ IMPLANT  2013 rod #1//  2014  rod 2   S1 -- T10  (rod #1)//   S1 -- T4 (rod #2)  . THORACIC FUSION  03-13-2013   REMOVAL HARDWARE/  BONE GRAFT FUSION T10//  REVISION OF RODS  . TOTAL KNEE ARTHROPLASTY Left 12-14-2005  . UVULOPALATOPHARYNGOPLASTY  04-26-2006   w/  TONSILLECTOMY/  TURBINATE REDUCTIONS/  BILATERAL ANTERIOR ETHMOIDECTOMY   Social History   Social History Narrative  . No narrative on file     Objective: Vital Signs: BP 132/85   Pulse 97   Resp 14   Ht 5' 4.5" (1.638 m)   Wt 198 lb (89.8 kg)   BMI 33.46 kg/m    Physical Exam  Constitutional: She is oriented to person, place, and time. She appears well-developed and well-nourished.  HENT:  Head: Normocephalic and atraumatic.  Eyes: Conjunctivae and EOM are normal.  Neck: Normal range of motion.  Cardiovascular: Normal rate, regular rhythm, normal heart sounds and intact distal pulses.   Bilateral pedal edema  Pulmonary/Chest: Effort normal and breath sounds normal.  Abdominal: Soft. Bowel sounds are normal.  Lymphadenopathy:    She has no cervical adenopathy.  Neurological: She is alert and oriented to person, place, and time.  Skin: Skin is warm and dry. Capillary  refill takes less than 2 seconds.  Psychiatric: She has a normal mood and affect. Her behavior is normal.  Nursing note and vitals reviewed.    Musculoskeletal Exam: C-spine very limited range of motion. Thoracic and lumbar spine limited range of motion with some discomfort. Her shoulder joint abduction is limited to 90 bilaterally. Elbow joints wrist joints are good range of motion. She has some thickening of PIP/DIP joints but no active synovitis. Hip joints are good range of motion. The left knees replaced doing well. Ankle joints MTPs PIPs DIPs with good range of motion with no synovitis.  CDAI Exam: CDAI Homunculus Exam:   Joint Counts:  CDAI Tender Joint count: 0 CDAI Swollen Joint count: 0  Global Assessments:  Patient Global Assessment: 5 Provider Global Assessment: 2  CDAI Calculated Score: 7    Investigation: Findings:  TB gold indeterminate 08/19/16   CBC Latest Ref Rng & Units 10/09/2016 08/07/2016 04/30/2016  WBC 4.0 - 10.5 K/uL 5.4 7.1 3.7(L)  Hemoglobin 12.0 - 15.0  g/dL 12.0 13.1 11.7  Hematocrit 36.0 - 46.0 % 37.0 39.3 36.6  Platelets 150 - 400 K/uL 208 246 219   CMP Latest Ref Rng & Units 10/09/2016 08/07/2016 04/30/2016  Glucose 65 - 99 mg/dL 82 73 94  BUN 6 - 20 mg/dL 7 14 7   Creatinine 0.44 - 1.00 mg/dL 0.70 0.56 0.60  Sodium 135 - 145 mmol/L 137 134(L) 139  Potassium 3.5 - 5.1 mmol/L 4.8 4.0 4.2  Chloride 101 - 111 mmol/L 105 97(L) 105  CO2 22 - 32 mmol/L 28 27 26   Calcium 8.9 - 10.3 mg/dL 9.7 9.7 9.1  Total Protein 6.5 - 8.1 g/dL 6.3(L) 6.5 5.5(L)  Total Bilirubin 0.3 - 1.2 mg/dL 0.4 0.3 0.4  Alkaline Phos 38 - 126 U/L 77 83 65  AST 15 - 41 U/L 20 13 18   ALT 14 - 54 U/L 12(L) 8 8     Imaging: No results found.  Speciality Comments: No specialty comments available.    Procedures:  No procedures performed Allergies: Latex; Amlodipine besy-benazepril hcl; Augmentin [amoxicillin-pot clavulanate]; Bextra [valdecoxib]; Lipitor [atorvastatin  calcium]; Sulfa antibiotics; and Zocor [simvastatin]   Assessment / Plan:     Visit Diagnoses: Psoriatic arthritis ([place]): She does not have any active synovitis on examination today. she appears to be doing quite well on current combination of therapy.  Psoriasis: She does not have any active psoriasis lesions on exam today  High risk medication use - Simponi Aria IV, Arava 20 mg po qd. She'll increase Arava to 20 mg by mouth daily 3 weeks ago. - Plan: Quantiferon tb gold assay (blood)  H/O total knee replacement, left: Doing well  DDD (degenerative disc disease), cervical s/p fusion: Chronic pain with limited range of motion  DDD (degenerative disc disease), thoracic: Chronic discomfort  DDD lumbar spine status post fusion: Chronic pain  Fibromyalgia syndrome: She continues to have some generalized pain and discomfort  Primary insomnia: Better with current medications  History of OCD (obsessive compulsive disorder)  History of hypertension Janet Peterson is mildly elevated at advised her to monitor that closely  History of hyperlipidemia  History of migraine  History of gastroesophageal reflux (GERD)  RLS (restless legs syndrome)  Other fatigue - Plan: VITAMIN D 25 Hydroxy (Vit-D Deficiency, Fractures)    Orders: Orders Placed This Encounter  Procedures  . Quantiferon tb gold assay (blood)  . VITAMIN D 25 Hydroxy (Vit-D Deficiency, Fractures)   Meds ordered this encounter  Medications                 . leflunomide (ARAVA) 20 MG tablet    Sig: Take 1 tablet (20 mg total) by mouth daily.    Dispense:  90 tablet    Refill:  0    Face-to-face time spent with patient was 30 minutes. 50% of time was spent in counseling and coordination of care.  Follow-Up Instructions: Return in about 5 months (around 04/15/2017) for Psoriatic arthritis DDD FMS.   Bo Merino, MD  Note - This record has been created using Editor, commissioning.  Chart creation errors have been sought, but  may not always  have been located. Such creation errors do not reflect on  the standard of medical care.

## 2016-11-13 ENCOUNTER — Ambulatory Visit (INDEPENDENT_AMBULATORY_CARE_PROVIDER_SITE_OTHER): Payer: Medicare Other | Admitting: Rheumatology

## 2016-11-13 ENCOUNTER — Encounter: Payer: Self-pay | Admitting: Rheumatology

## 2016-11-13 VITALS — BP 132/85 | HR 97 | Resp 14 | Ht 64.5 in | Wt 198.0 lb

## 2016-11-13 DIAGNOSIS — R5383 Other fatigue: Secondary | ICD-10-CM | POA: Diagnosis not present

## 2016-11-13 DIAGNOSIS — L4050 Arthropathic psoriasis, unspecified: Secondary | ICD-10-CM | POA: Diagnosis not present

## 2016-11-13 DIAGNOSIS — Z8679 Personal history of other diseases of the circulatory system: Secondary | ICD-10-CM

## 2016-11-13 DIAGNOSIS — M47816 Spondylosis without myelopathy or radiculopathy, lumbar region: Secondary | ICD-10-CM

## 2016-11-13 DIAGNOSIS — Z8659 Personal history of other mental and behavioral disorders: Secondary | ICD-10-CM

## 2016-11-13 DIAGNOSIS — Z8719 Personal history of other diseases of the digestive system: Secondary | ICD-10-CM

## 2016-11-13 DIAGNOSIS — G2581 Restless legs syndrome: Secondary | ICD-10-CM

## 2016-11-13 DIAGNOSIS — M5134 Other intervertebral disc degeneration, thoracic region: Secondary | ICD-10-CM | POA: Diagnosis not present

## 2016-11-13 DIAGNOSIS — Z96652 Presence of left artificial knee joint: Secondary | ICD-10-CM

## 2016-11-13 DIAGNOSIS — Z8669 Personal history of other diseases of the nervous system and sense organs: Secondary | ICD-10-CM

## 2016-11-13 DIAGNOSIS — L409 Psoriasis, unspecified: Secondary | ICD-10-CM | POA: Diagnosis not present

## 2016-11-13 DIAGNOSIS — M5030 Other cervical disc degeneration, unspecified cervical region: Secondary | ICD-10-CM | POA: Diagnosis not present

## 2016-11-13 DIAGNOSIS — M797 Fibromyalgia: Secondary | ICD-10-CM

## 2016-11-13 DIAGNOSIS — Z79899 Other long term (current) drug therapy: Secondary | ICD-10-CM | POA: Diagnosis not present

## 2016-11-13 DIAGNOSIS — F5101 Primary insomnia: Secondary | ICD-10-CM

## 2016-11-13 DIAGNOSIS — Z8639 Personal history of other endocrine, nutritional and metabolic disease: Secondary | ICD-10-CM | POA: Diagnosis not present

## 2016-11-13 MED ORDER — LEFLUNOMIDE 20 MG PO TABS: 20.0000 mg | ORAL_TABLET | Freq: Every day | ORAL | 0 refills | Status: DC

## 2016-11-13 MED ORDER — LEFLUNOMIDE 10 MG PO TABS: 20.0000 mg | ORAL_TABLET | Freq: Every day | ORAL | 0 refills | Status: DC

## 2016-11-14 LAB — VITAMIN D 25 HYDROXY (VIT D DEFICIENCY, FRACTURES): Vit D, 25-Hydroxy: 26 ng/mL — ABNORMAL LOW (ref 30–100)

## 2016-11-16 LAB — QUANTIFERON TB GOLD ASSAY (BLOOD)
Interferon Gamma Release Assay: NEGATIVE
Mitogen-Nil: 7.67 IU/mL
Quantiferon Nil Value: 0.03 IU/mL
Quantiferon Tb Ag Minus Nil Value: 0.01 IU/mL

## 2016-11-16 NOTE — Progress Notes (Signed)
Call and vitamin D 50,000 units once a week for 3 months. Repeat vitamin D level in 3 months

## 2016-11-17 ENCOUNTER — Other Ambulatory Visit: Payer: Self-pay | Admitting: Family Medicine

## 2016-11-17 ENCOUNTER — Ambulatory Visit: Payer: Medicare Other | Admitting: Rheumatology

## 2016-11-17 ENCOUNTER — Telehealth: Payer: Self-pay | Admitting: Radiology

## 2016-11-17 DIAGNOSIS — E559 Vitamin D deficiency, unspecified: Secondary | ICD-10-CM

## 2016-11-17 MED ORDER — VITAMIN D3 1.25 MG (50000 UT) PO CAPS
50000.0000 [IU] | ORAL_CAPSULE | ORAL | 0 refills | Status: AC
Start: 2016-11-17 — End: 2017-02-15

## 2016-11-17 NOTE — Telephone Encounter (Signed)
-----   Message from Bo Merino, MD sent at 11/16/2016  3:54 PM EDT ----- Call and vitamin D 50,000 units once a week for 3 months. Repeat vitamin D level in 3 months

## 2016-12-01 ENCOUNTER — Other Ambulatory Visit: Payer: Self-pay | Admitting: Family Medicine

## 2016-12-01 ENCOUNTER — Telehealth: Payer: Self-pay | Admitting: Family Medicine

## 2016-12-01 ENCOUNTER — Other Ambulatory Visit: Payer: Self-pay | Admitting: Radiology

## 2016-12-01 MED ORDER — OXYCODONE-ACETAMINOPHEN 5-325 MG PO TABS: 1.0000 | ORAL_TABLET | ORAL | 0 refills | Status: DC | PRN

## 2016-12-01 NOTE — Telephone Encounter (Signed)
Patient requesting a refill on Oxycodone - Ok to refill??         Per Dr. Dennard Schaumann OK to refill

## 2016-12-01 NOTE — Progress Notes (Signed)
Infusion orders are current for patient CBC CMP Tylenol Benadryl TB gold not due yet, due March 2019 her appointments are up to date and follow up appointment is scheduled

## 2016-12-02 NOTE — Telephone Encounter (Signed)
RX printed, left up front and patient aware to pick up  

## 2016-12-03 ENCOUNTER — Other Ambulatory Visit (HOSPITAL_COMMUNITY): Payer: Self-pay | Admitting: *Deleted

## 2016-12-04 ENCOUNTER — Ambulatory Visit (HOSPITAL_COMMUNITY)
Admission: RE | Admit: 2016-12-04 | Discharge: 2016-12-04 | Disposition: A | Discharge status: Home / Self Care | Payer: Medicare Other | Source: Ambulatory Visit | Attending: Rheumatology | Admitting: Rheumatology

## 2016-12-04 DIAGNOSIS — L4050 Arthropathic psoriasis, unspecified: Secondary | ICD-10-CM | POA: Insufficient documentation

## 2016-12-04 LAB — CBC WITH DIFFERENTIAL/PLATELET
Basophils Absolute: 0 10*3/uL (ref 0.0–0.1)
Basophils Relative: 1 %
Eosinophils Absolute: 0.1 10*3/uL (ref 0.0–0.7)
Eosinophils Relative: 2 %
HCT: 39.6 % (ref 36.0–46.0)
Hemoglobin: 13 g/dL (ref 12.0–15.0)
Lymphocytes Relative: 40 %
Lymphs Abs: 2 10*3/uL (ref 0.7–4.0)
MCH: 30.9 pg (ref 26.0–34.0)
MCHC: 32.8 g/dL (ref 30.0–36.0)
MCV: 94.1 fL (ref 78.0–100.0)
Monocytes Absolute: 0.3 10*3/uL (ref 0.1–1.0)
Monocytes Relative: 5 %
Neutro Abs: 2.5 10*3/uL (ref 1.7–7.7)
Neutrophils Relative %: 52 %
Platelets: 206 10*3/uL (ref 150–400)
RBC: 4.21 MIL/uL (ref 3.87–5.11)
RDW: 13.7 % (ref 11.5–15.5)
WBC: 4.9 10*3/uL (ref 4.0–10.5)

## 2016-12-04 LAB — COMPREHENSIVE METABOLIC PANEL
ALT: 13 U/L — ABNORMAL LOW (ref 14–54)
AST: 22 U/L (ref 15–41)
Albumin: 3.7 g/dL (ref 3.5–5.0)
Alkaline Phosphatase: 84 U/L (ref 38–126)
Anion gap: 7 (ref 5–15)
BUN: 9 mg/dL (ref 6–20)
CO2: 29 mmol/L (ref 22–32)
Calcium: 9.6 mg/dL (ref 8.9–10.3)
Chloride: 99 mmol/L — ABNORMAL LOW (ref 101–111)
Creatinine, Ser: 0.77 mg/dL (ref 0.44–1.00)
GFR calc Af Amer: >60 mL/min (ref >60)
GFR calc non Af Amer: >60 mL/min (ref >60)
Glucose, Bld: 169 mg/dL — ABNORMAL HIGH (ref 65–99)
Potassium: 3.7 mmol/L (ref 3.5–5.1)
Sodium: 135 mmol/L (ref 135–145)
Total Bilirubin: 0.5 mg/dL (ref 0.3–1.2)
Total Protein: 6.4 g/dL — ABNORMAL LOW (ref 6.5–8.1)

## 2016-12-04 MED ORDER — DIPHENHYDRAMINE HCL 25 MG PO CAPS: 25.0000 mg | ORAL_CAPSULE | ORAL | Status: DC

## 2016-12-04 MED ORDER — SODIUM CHLORIDE 0.9 % IV SOLN
2.0000 mg/kg | INTRAVENOUS | Status: DC
  Administered 2016-12-04: 176.25 mg via INTRAVENOUS
  Filled 2016-12-04: qty 14.1

## 2016-12-04 MED ORDER — ACETAMINOPHEN 325 MG PO TABS: 650.0000 mg | ORAL_TABLET | ORAL | Status: DC

## 2016-12-04 NOTE — Progress Notes (Signed)
Within normal limits

## 2016-12-04 NOTE — Progress Notes (Signed)
Elevated glucose. Please notify patient and fax results to PCP

## 2016-12-08 ENCOUNTER — Other Ambulatory Visit: Payer: Self-pay | Admitting: Family Medicine

## 2016-12-30 DIAGNOSIS — F329 Major depressive disorder, single episode, unspecified: Secondary | ICD-10-CM | POA: Diagnosis not present

## 2017-01-13 ENCOUNTER — Other Ambulatory Visit: Payer: Self-pay | Admitting: Family Medicine

## 2017-01-13 MED ORDER — LOSARTAN POTASSIUM 50 MG PO TABS: 50.0000 mg | ORAL_TABLET | Freq: Every day | ORAL | 3 refills | Status: DC

## 2017-01-27 DIAGNOSIS — D126 Benign neoplasm of colon, unspecified: Secondary | ICD-10-CM | POA: Diagnosis not present

## 2017-01-27 DIAGNOSIS — Z86010 Personal history of colonic polyps: Secondary | ICD-10-CM | POA: Diagnosis not present

## 2017-01-27 DIAGNOSIS — K5730 Diverticulosis of large intestine without perforation or abscess without bleeding: Secondary | ICD-10-CM | POA: Diagnosis not present

## 2017-01-29 ENCOUNTER — Encounter (HOSPITAL_COMMUNITY): Payer: Medicare Other

## 2017-02-01 ENCOUNTER — Telehealth: Payer: Self-pay | Admitting: *Deleted

## 2017-02-01 DIAGNOSIS — D126 Benign neoplasm of colon, unspecified: Secondary | ICD-10-CM | POA: Diagnosis not present

## 2017-02-01 NOTE — Telephone Encounter (Signed)
Received call from patient.   Requested refill on Oxycodone.   Ok to refill??  Last office visit 09/10/2016.  Last refill 12/01/2016.

## 2017-02-02 MED ORDER — OXYCODONE-ACETAMINOPHEN 5-325 MG PO TABS: 1.0000 | ORAL_TABLET | ORAL | 0 refills | Status: DC | PRN

## 2017-02-02 NOTE — Telephone Encounter (Signed)
ok 

## 2017-02-02 NOTE — Telephone Encounter (Signed)
Prescription printed and patient made aware to come to office to pick up after 2pm on 02/02/2017.

## 2017-02-05 ENCOUNTER — Ambulatory Visit (HOSPITAL_COMMUNITY): Admission: RE | Admit: 2017-02-05 | Payer: Medicare Other | Source: Ambulatory Visit

## 2017-02-05 ENCOUNTER — Other Ambulatory Visit (HOSPITAL_COMMUNITY): Payer: Self-pay | Admitting: *Deleted

## 2017-02-06 ENCOUNTER — Other Ambulatory Visit: Payer: Self-pay | Admitting: Rheumatology

## 2017-02-08 ENCOUNTER — Ambulatory Visit (HOSPITAL_COMMUNITY)
Admission: RE | Admit: 2017-02-08 | Discharge: 2017-02-08 | Disposition: A | Discharge status: Home / Self Care | Payer: Medicare Other | Source: Ambulatory Visit | Attending: Rheumatology | Admitting: Rheumatology

## 2017-02-08 DIAGNOSIS — L4050 Arthropathic psoriasis, unspecified: Secondary | ICD-10-CM

## 2017-02-08 LAB — CBC WITH DIFFERENTIAL/PLATELET
Basophils Absolute: 0 10*3/uL (ref 0.0–0.1)
Basophils Relative: 1 %
Eosinophils Absolute: 0.1 10*3/uL (ref 0.0–0.7)
Eosinophils Relative: 3 %
HCT: 39.2 % (ref 36.0–46.0)
Hemoglobin: 12.9 g/dL (ref 12.0–15.0)
Lymphocytes Relative: 43 %
Lymphs Abs: 1.8 10*3/uL (ref 0.7–4.0)
MCH: 30.8 pg (ref 26.0–34.0)
MCHC: 32.9 g/dL (ref 30.0–36.0)
MCV: 93.6 fL (ref 78.0–100.0)
Monocytes Absolute: 0.4 10*3/uL (ref 0.1–1.0)
Monocytes Relative: 10 %
Neutro Abs: 1.9 10*3/uL (ref 1.7–7.7)
Neutrophils Relative %: 43 %
Platelets: 226 10*3/uL (ref 150–400)
RBC: 4.19 MIL/uL (ref 3.87–5.11)
RDW: 14 % (ref 11.5–15.5)
WBC: 4.3 10*3/uL (ref 4.0–10.5)

## 2017-02-08 LAB — COMPREHENSIVE METABOLIC PANEL
ALT: 13 U/L — ABNORMAL LOW (ref 14–54)
AST: 25 U/L (ref 15–41)
Albumin: 3.8 g/dL (ref 3.5–5.0)
Alkaline Phosphatase: 87 U/L (ref 38–126)
Anion gap: 9 (ref 5–15)
BUN: 7 mg/dL (ref 6–20)
CO2: 25 mmol/L (ref 22–32)
Calcium: 9.6 mg/dL (ref 8.9–10.3)
Chloride: 98 mmol/L — ABNORMAL LOW (ref 101–111)
Creatinine, Ser: 0.8 mg/dL (ref 0.44–1.00)
GFR calc Af Amer: >60 mL/min (ref >60)
GFR calc non Af Amer: >60 mL/min (ref >60)
Glucose, Bld: 146 mg/dL — ABNORMAL HIGH (ref 65–99)
Potassium: 3.6 mmol/L (ref 3.5–5.1)
Sodium: 132 mmol/L — ABNORMAL LOW (ref 135–145)
Total Bilirubin: 0.5 mg/dL (ref 0.3–1.2)
Total Protein: 6.7 g/dL (ref 6.5–8.1)

## 2017-02-08 MED ORDER — SODIUM CHLORIDE 0.9 % IV SOLN
2.0000 mg/kg | INTRAVENOUS | Status: DC
  Administered 2017-02-08: 181.25 mg via INTRAVENOUS
  Filled 2017-02-08: qty 14.5

## 2017-02-08 MED ORDER — ACETAMINOPHEN 325 MG PO TABS: 650.0000 mg | ORAL_TABLET | ORAL | Status: DC

## 2017-02-08 MED ORDER — DIPHENHYDRAMINE HCL 25 MG PO CAPS: 25.0000 mg | ORAL_CAPSULE | ORAL | Status: DC

## 2017-02-08 NOTE — Telephone Encounter (Signed)
Left message to advise patient to we will needs labs before we can refill.

## 2017-02-08 NOTE — Progress Notes (Signed)
Labs are stable.

## 2017-02-11 ENCOUNTER — Other Ambulatory Visit: Payer: Self-pay | Admitting: Rheumatology

## 2017-02-11 NOTE — Telephone Encounter (Signed)
Last Visit: 11/13/16 Next Visit: 04/20/17 Labs: 02/08/17 stable  Okay to refill per Dr. Estanislado Pandy

## 2017-03-09 ENCOUNTER — Ambulatory Visit (INDEPENDENT_AMBULATORY_CARE_PROVIDER_SITE_OTHER): Payer: Medicare Other | Admitting: Family Medicine

## 2017-03-09 ENCOUNTER — Encounter: Payer: Self-pay | Admitting: Family Medicine

## 2017-03-09 VITALS — BP 132/94 | HR 88 | Temp 98.1°F | Resp 14 | Wt 196.0 lb

## 2017-03-09 DIAGNOSIS — J310 Chronic rhinitis: Secondary | ICD-10-CM

## 2017-03-09 DIAGNOSIS — J329 Chronic sinusitis, unspecified: Secondary | ICD-10-CM

## 2017-03-09 DIAGNOSIS — Z23 Encounter for immunization: Secondary | ICD-10-CM | POA: Diagnosis not present

## 2017-03-09 MED ORDER — CEFDINIR 300 MG PO CAPS: 300.0000 mg | ORAL_CAPSULE | Freq: Two times a day (BID) | ORAL | 0 refills | Status: DC

## 2017-03-09 NOTE — Progress Notes (Signed)
Subjective:    Patient ID: Janet Peterson, female    DOB: Jul 08, 1950, 66 y.o.   MRN: [id number]  HPI Symptoms began 14 days ago.Started with a head cold. She now has pain and pressure in her frontal sinus bilaterally as well as in her left maxillary sinus. She has constant pressure, constant pain, rhinorrhea, postnasal drip. She has mild cough secondary to postnasal drip. She has a scratchy throat secondary to postnasal drip. She also reports hearing loss and pressure in her left ear similar to eustachian tube dysfunction. Past Medical History:  Diagnosis Date  . Ankylosing spondylitis ([place])   . Anxiety and depression   . Fibromyalgia   . GERD (gastroesophageal reflux disease)   . History of basal cell carcinoma excision    FACE, [place]  . History of hiatal hernia   . History of thrush   . Hyperlipidemia   . Hypertension   . Insomnia   . OCD (obsessive compulsive disorder)   . OSA (obstructive sleep apnea)    MILD AND NO CPAP SINCE SURGERY IN 2007  . Osteopenia   . PONV (postoperative nausea and vomiting)   . Psoriatic arthritis ([place])   . PVC (premature ventricular contraction)   . Rheumatoid arthritis [facility])    Past Surgical History:  Procedure Laterality Date  . BREAST SURGERY  1975   lumpectomy-- benign  . BUNIONECTOMY  1991  . CATARACT EXTRACTION W/ INTRAOCULAR LENS  IMPLANT, BILATERAL  2000  . CERVICAL FUSION  March 2013   C5 -- C7  . CESAREAN SECTION  1985  . DISTAL INTERPHALANGEAL JOINT FUSION Right 03/14/2015   Procedure: RIGHT LONG FINGER DISTAL INTERPHALANGEAL JOINT ARTHRODESIS;  Surgeon: Iran Planas, MD;  Location: [place];  Service: Orthopedics;  Laterality: Right;  . EYE SURGERY  1995   rk (laser surgery), semi cornea transplant, detacted retina,  fluid removal  . KNEE ARTHROSCOPY Left 03-03-2004  . POSTERIOR VITRECTOMY RIGHT EYE AND LASER   10-27-1999  . SHOULDER SURGERY Right 1997  . SPINAL FIXATION SURGERY W/ IMPLANT  2013 rod #1//   2014  rod 2   S1 -- T10  (rod #1)//   S1 -- T4 (rod #2)  . THORACIC FUSION  03-13-2013   REMOVAL HARDWARE/  BONE GRAFT FUSION T10//  REVISION OF RODS  . TOTAL KNEE ARTHROPLASTY Left 12-14-2005  . UVULOPALATOPHARYNGOPLASTY  04-26-2006   w/  TONSILLECTOMY/  TURBINATE REDUCTIONS/  BILATERAL ANTERIOR ETHMOIDECTOMY   Current Outpatient Prescriptions on File Prior to Visit  Medication Sig Dispense Refill  . baclofen (LIORESAL) 10 MG tablet Take 10 mg by mouth 2 (two) times daily. Reported on 12/09/2015    . buPROPion (WELLBUTRIN) 75 MG tablet Take 75 mg by mouth daily.    . clomiPRAMINE (ANAFRANIL) 75 MG capsule Take 75 mg by mouth 2 (two) times daily.  2  . clonazePAM (KLONOPIN) 1 MG tablet TAKE 1 TABLET BY MOUTH EVERY DAY AS NEEDED FOR ANXIETY  1  . DEXILANT 60 MG capsule TAKE 1 CAPSULE BY MOUTH EVERY DAY 90 capsule 3  . ezetimibe (ZETIA) 10 MG tablet TAKE 1 TABLET BY MOUTH EVERY DAY 90 tablet 3  . furosemide (LASIX) 40 MG tablet TAKE 1 TABLET (40 MG TOTAL) BY MOUTH DAILY. 30 tablet 9  . KLOR-CON M20 20 MEQ tablet TAKE 1 TABLET BY MOUTH EVERY DAY 90 tablet 3  . leflunomide (ARAVA) 20 MG tablet TAKE 1 TABLET BY MOUTH EVERY DAY 90 tablet  0  . losartan (COZAAR) 50 MG tablet Take 1 tablet (50 mg total) by mouth daily. 90 tablet 3  . metoprolol succinate (TOPROL-XL) 50 MG 24 hr tablet TAKE 1 TABLET BY MOUTH EVERY DAY WITH OR IMMEDIATELY FOLLOWING A MEAL 90 tablet 3  . oxyCODONE-acetaminophen (PERCOCET) 5-325 MG tablet Take 1 tablet by mouth every 4 (four) hours as needed. 60 tablet 0  . pravastatin (PRAVACHOL) 20 MG tablet TAKE 1 TABLET BY MOUTH DAILY (Patient taking differently: TAKE 1 TABLET BY MOUTH DAILY--- TAKES IN PM) 90 tablet 3  . sucralfate (CARAFATE) 1 G tablet Take 1 tablet (1 g total) by mouth 4 (four) times daily -  with meals and at bedtime. 120 tablet 3  . SUMAtriptan (IMITREX) 50 MG tablet TAKE 1 TABLET BY MOUTH ONCE DAILY AS NEEDED FOR HEADACHES 30 tablet 3  . traZODone (DESYREL) 100  MG tablet Take 200 mg by mouth at bedtime.    . [DISCONTINUED] Golimumab 50 MG/0.5ML SOAJ Inject 0.5 mLs into the skin every 30 (thirty) days. 1 Syringe 0   No current facility-administered medications on file prior to visit.    Allergies  Allergen Reactions  . Latex Rash    And Mouth sores  . Amlodipine Besy-Benazepril Hcl Other (See Comments)    COUGH  . Augmentin [Amoxicillin-Pot Clavulanate] Diarrhea and Nausea And Vomiting  . Bextra [Valdecoxib] Diarrhea and Nausea And Vomiting    REFLUX  . Lipitor [Atorvastatin Calcium] Other (See Comments)    MYALGIAS  . Sulfa Antibiotics Nausea And Vomiting    CRAMPING  . Zocor [Simvastatin] Other (See Comments)    MYALGIA   Social History   Social History  . Marital status: Married    Spouse name: N/A  . Number of children: N/A  . Years of education: N/A   Occupational History  . Not on file.   Social History Main Topics  . Smoking status: Former Smoker    Packs/day: 0.50    Years: 10.00    Types: Cigarettes    Quit date: 03/07/1995  . Smokeless tobacco: Never Used  . Alcohol use Yes     Comment: rare  . Drug use: No  . Sexual activity: Not on file   Other Topics Concern  . Not on file   Social History Narrative  . No narrative on file      Review of Systems  All other systems reviewed and are negative.      Objective:   Physical Exam  HENT:  Right Ear: Tympanic membrane, external ear and ear canal normal.  Left Ear: Tympanic membrane, external ear and ear canal normal.  Nose: Mucosal edema and rhinorrhea present. Right sinus exhibits frontal sinus tenderness. Left sinus exhibits maxillary sinus tenderness and frontal sinus tenderness.  Mouth/Throat: Oropharynx is clear and moist. No oropharyngeal exudate.  Eyes: Conjunctivae are normal.  Neck: Neck supple.  Cardiovascular: Normal rate, regular rhythm and normal heart sounds.   Pulmonary/Chest: Effort normal and breath sounds normal. No respiratory  distress. She has no wheezes. She has no rales.  Lymphadenopathy:    She has no cervical adenopathy.  Vitals reviewed.         Assessment & Plan:  Rhinosinusitis - Plan: cefdinir (OMNICEF) 300 MG capsule  Flu vaccine need - Plan: Flu Vaccine QUAD 36+ mos IM  Begin Omnicef 300 mg by mouth twice a day for 10 days. She also received her flu shot

## 2017-03-11 ENCOUNTER — Telehealth: Payer: Self-pay | Admitting: Family Medicine

## 2017-03-11 ENCOUNTER — Other Ambulatory Visit: Payer: Self-pay

## 2017-03-11 MED ORDER — SUMATRIPTAN SUCCINATE 50 MG PO TABS: ORAL_TABLET | ORAL | 0 refills | Status: DC

## 2017-03-11 NOTE — Telephone Encounter (Signed)
Medication refilled per protocol. 

## 2017-04-02 ENCOUNTER — Other Ambulatory Visit: Payer: Self-pay | Admitting: *Deleted

## 2017-04-02 ENCOUNTER — Telehealth: Payer: Self-pay | Admitting: Family Medicine

## 2017-04-02 NOTE — Progress Notes (Signed)
Infusion orders are current for patient CBC CMP Tylenol Benadryl appointments are up to date and follow up appointment  is scheduled TB gold not due yet.  

## 2017-04-02 NOTE — Telephone Encounter (Signed)
Pt called wanting refill on oxycodone.

## 2017-04-02 NOTE — Telephone Encounter (Signed)
Ok to refill 

## 2017-04-02 NOTE — Telephone Encounter (Signed)
ok 

## 2017-04-05 ENCOUNTER — Encounter (HOSPITAL_COMMUNITY): Payer: Medicare Other

## 2017-04-05 MED ORDER — OXYCODONE-ACETAMINOPHEN 5-325 MG PO TABS: 1.0000 | ORAL_TABLET | ORAL | 0 refills | Status: DC | PRN

## 2017-04-05 NOTE — Addendum Note (Signed)
Addended by: Shary Decamp B on: 04/05/2017 09:23 AM   Modules accepted: Orders

## 2017-04-05 NOTE — Telephone Encounter (Signed)
RX printed, left up front and patient aware to pick up via vm 

## 2017-04-06 DIAGNOSIS — F329 Major depressive disorder, single episode, unspecified: Secondary | ICD-10-CM | POA: Diagnosis not present

## 2017-04-08 ENCOUNTER — Ambulatory Visit (HOSPITAL_COMMUNITY)
Admission: RE | Admit: 2017-04-08 | Discharge: 2017-04-08 | Disposition: A | Discharge status: Home / Self Care | Payer: Medicare Other | Source: Ambulatory Visit | Attending: Rheumatology | Admitting: Rheumatology

## 2017-04-08 DIAGNOSIS — L4050 Arthropathic psoriasis, unspecified: Secondary | ICD-10-CM

## 2017-04-08 LAB — CBC WITH DIFFERENTIAL/PLATELET
Basophils Absolute: 0 10*3/uL (ref 0.0–0.1)
Basophils Relative: 1 %
Eosinophils Absolute: 0.1 10*3/uL (ref 0.0–0.7)
Eosinophils Relative: 2 %
HCT: 37.2 % (ref 36.0–46.0)
Hemoglobin: 12.1 g/dL (ref 12.0–15.0)
Lymphocytes Relative: 34 %
Lymphs Abs: 1.5 10*3/uL (ref 0.7–4.0)
MCH: 30.9 pg (ref 26.0–34.0)
MCHC: 32.5 g/dL (ref 30.0–36.0)
MCV: 95.1 fL (ref 78.0–100.0)
Monocytes Absolute: 0.4 10*3/uL (ref 0.1–1.0)
Monocytes Relative: 9 %
Neutro Abs: 2.4 10*3/uL (ref 1.7–7.7)
Neutrophils Relative %: 54 %
Platelets: 189 10*3/uL (ref 150–400)
RBC: 3.91 MIL/uL (ref 3.87–5.11)
RDW: 14.1 % (ref 11.5–15.5)
WBC: 4.5 10*3/uL (ref 4.0–10.5)

## 2017-04-08 LAB — COMPREHENSIVE METABOLIC PANEL
ALT: 10 U/L — ABNORMAL LOW (ref 14–54)
AST: 22 U/L (ref 15–41)
Albumin: 3.6 g/dL (ref 3.5–5.0)
Alkaline Phosphatase: 98 U/L (ref 38–126)
Anion gap: 8 (ref 5–15)
BUN: <5 mg/dL — ABNORMAL LOW (ref 6–20)
CO2: 25 mmol/L (ref 22–32)
Calcium: 9.3 mg/dL (ref 8.9–10.3)
Chloride: 104 mmol/L (ref 101–111)
Creatinine, Ser: 0.69 mg/dL (ref 0.44–1.00)
GFR calc Af Amer: >60 mL/min (ref >60)
GFR calc non Af Amer: >60 mL/min (ref >60)
Glucose, Bld: 154 mg/dL — ABNORMAL HIGH (ref 65–99)
Potassium: 3.6 mmol/L (ref 3.5–5.1)
Sodium: 137 mmol/L (ref 135–145)
Total Bilirubin: 0.5 mg/dL (ref 0.3–1.2)
Total Protein: 6.3 g/dL — ABNORMAL LOW (ref 6.5–8.1)

## 2017-04-08 MED ORDER — DIPHENHYDRAMINE HCL 25 MG PO CAPS: 25.0000 mg | ORAL_CAPSULE | ORAL | Status: DC

## 2017-04-08 MED ORDER — SODIUM CHLORIDE 0.9 % IV SOLN
2.0000 mg/kg | INTRAVENOUS | Status: AC
  Administered 2017-04-08: 181.25 mg via INTRAVENOUS
  Filled 2017-04-08: qty 14.5

## 2017-04-08 MED ORDER — SODIUM CHLORIDE 0.9 % IV SOLN
INTRAVENOUS | Status: DC
  Administered 2017-04-08: 12:00:00 via INTRAVENOUS

## 2017-04-08 MED ORDER — ACETAMINOPHEN 325 MG PO TABS: 650.0000 mg | ORAL_TABLET | ORAL | Status: DC

## 2017-04-08 NOTE — Progress Notes (Signed)
stable °

## 2017-04-10 NOTE — Progress Notes (Signed)
Office Visit Note  Patient: Janet Peterson             Date of Birth: [birth year]           MRN: [id number]             PCP: Susy Frizzle, MD Referring: Susy Frizzle, MD Visit Date: 04/20/2017 Occupation: @GUAROCC @    Subjective:  Other (doing good, bruised ribs (post fall 3 weeks ago))   History of Present Illness: Janet Peterson is a 66 y.o. female with history of psoriatic arthritis psoriasis and this disease. She states she is doing fairly well without any joint swelling. She's been taking Arava 20 mg a day. She's also on Simponi Aria infusions. She fell about 3 weeks ago and bruised her ribs which are gradually improving. She does have some discomfort from underlying disc disease and she has to take pain medication at night time. Her fibromyalgia is fairly well controlled.  Activities of Daily Living:  Patient reports morning stiffness for 1 hour.   Patient Reports nocturnal pain.  Difficulty dressing/grooming: Denies Difficulty climbing stairs: Reports Difficulty getting out of chair: Reports Difficulty using hands for taps, buttons, cutlery, and/or writing: Denies   Review of Systems  Constitutional: Positive for fatigue. Negative for night sweats, weight gain, weight loss and weakness.  HENT: Negative for mouth sores, trouble swallowing, trouble swallowing, mouth dryness and nose dryness.   Eyes: Positive for dryness. Negative for pain, redness and visual disturbance.  Respiratory: Negative for cough, shortness of breath and difficulty breathing.   Cardiovascular: Negative for chest pain, palpitations, hypertension, irregular heartbeat and swelling in legs/feet.  Gastrointestinal: Negative for blood in stool, constipation and diarrhea.  Endocrine: Negative for increased urination.  Genitourinary: Negative for vaginal dryness.  Musculoskeletal: Positive for arthralgias, joint pain and morning stiffness. Negative for joint swelling, myalgias, muscle weakness, muscle  tenderness and myalgias.  Skin: Negative for color change, rash, hair loss, skin tightness, ulcers and sensitivity to sunlight.  Allergic/Immunologic: Negative for susceptible to infections.  Neurological: Negative for dizziness, memory loss and night sweats.  Hematological: Negative for swollen glands.  Psychiatric/Behavioral: Positive for depressed mood. Negative for sleep disturbance. The patient is nervous/anxious.     PMFS History:  Patient Active Problem List   Diagnosis Date Noted  . DDD (degenerative disc disease), cervical s/p fusion 11/12/2016  . H/O total knee replacement, left 05/06/2016  . DDD lumbar spine status post fusion 05/06/2016  . Psoriasis 05/05/2016  . High risk medication use 05/05/2016  . Cervical post-laminectomy syndrome 12/09/2015  . Thoracic postlaminectomy syndrome 12/09/2015  . Psoriatic arthritis ([place]) 08/23/2012  . Osteopenia 08/23/2012  . HLD (hyperlipidemia) 08/23/2012  . RLS (restless legs syndrome) 08/23/2012  . Insomnia 08/23/2012  . Fibromyalgia syndrome 08/12/2012  . Low back pain 08/12/2012  . Syncope   . PVC (premature ventricular contraction)   . OCD (obsessive compulsive disorder)   . GERD (gastroesophageal reflux disease)   . Hypertension     Past Medical History:  Diagnosis Date  . Ankylosing spondylitis ([place])   . Anxiety and depression   . Depression   . Fibromyalgia   . GERD (gastroesophageal reflux disease)   . History of basal cell carcinoma excision    FACE, [place]  . History of hiatal hernia   . History of thrush   . Hyperlipidemia   . Hypertension   . Insomnia   . OCD (obsessive compulsive disorder)   . OSA (  obstructive sleep apnea)    MILD AND NO CPAP SINCE SURGERY IN 2007  . Osteopenia   . PONV (postoperative nausea and vomiting)   . Psoriatic arthritis ([place])   . PVC (premature ventricular contraction)   . Rheumatoid arthritis ([place])     Family History  Problem Relation Age of Onset  . Diabetes Mother    . Heart disease Mother   . Diabetes Father   . Anuerysm Father   . Diabetes Brother   . Heart disease Brother   . Diabetes Brother   . Heart disease Brother    Past Surgical History:  Procedure Laterality Date  . BREAST SURGERY  1975   lumpectomy-- benign  . BUNIONECTOMY  1991  . CATARACT EXTRACTION W/ INTRAOCULAR LENS  IMPLANT, BILATERAL  2000  . CERVICAL FUSION  March 2013   C5 -- C7  . CESAREAN SECTION  1985  . DISTAL INTERPHALANGEAL JOINT FUSION Right 03/14/2015   Procedure: RIGHT LONG FINGER DISTAL INTERPHALANGEAL JOINT ARTHRODESIS;  Surgeon: Iran Planas, MD;  Location: [place];  Service: Orthopedics;  Laterality: Right;  . EYE SURGERY  1995   rk (laser surgery), semi cornea transplant, detacted retina,  fluid removal  . KNEE ARTHROSCOPY Left 03-03-2004  . POSTERIOR VITRECTOMY RIGHT EYE AND LASER   10-27-1999  . SHOULDER SURGERY Right 1997  . SPINAL FIXATION SURGERY W/ IMPLANT  2013 rod #1//  2014  rod 2   S1 -- T10  (rod #1)//   S1 -- T4 (rod #2)  . THORACIC FUSION  03-13-2013   REMOVAL HARDWARE/  BONE GRAFT FUSION T10//  REVISION OF RODS  . TOTAL KNEE ARTHROPLASTY Left 12-14-2005  . UVULOPALATOPHARYNGOPLASTY  04-26-2006   w/  TONSILLECTOMY/  TURBINATE REDUCTIONS/  BILATERAL ANTERIOR ETHMOIDECTOMY   Social History   Social History Narrative  . Not on file     Objective: Vital Signs: BP 138/84 (BP Location: Left Arm, Patient Position: Sitting, Cuff Size: Normal)   Pulse 88   Resp 16   Ht 5\' 4"  (1.626 m)   Wt 195 lb (88.5 kg)   BMI 33.47 kg/m    Physical Exam  Constitutional: She is oriented to person, place, and time. She appears well-developed and well-nourished.  HENT:  Head: Normocephalic and atraumatic.  Eyes: Conjunctivae and EOM are normal.  Neck: Normal range of motion.  Cardiovascular: Normal rate, regular rhythm, normal heart sounds and intact distal pulses.  Pulmonary/Chest: Effort normal and breath sounds normal.    Abdominal: Soft. Bowel sounds are normal.  Lymphadenopathy:    She has no cervical adenopathy.  Neurological: She is alert and oriented to person, place, and time.  Skin: Skin is warm and dry. Capillary refill takes less than 2 seconds.  Psychiatric: She has a normal mood and affect. Her behavior is normal.  Nursing note and vitals reviewed.    Musculoskeletal Exam: C-spine limited ROM. Thoracic and lumbar spine good ROM. Shoulder joints, elbow joints, and wrists good ROM.  MCPs and PIPs DIPs have synovial thickening.Knees no warmth, swelling, or effusion.  Hips good ROM. No tenderness across MTPs and PIPs was noted.  CDAI Exam: CDAI Homunculus Exam:   Joint Counts:  CDAI Tender Joint count: 0 CDAI Swollen Joint count: 0  Global Assessments:  Patient Global Assessment: 5 Provider Global Assessment: 2  CDAI Calculated Score: 7    Investigation: No additional findings.TB Gold: 11/13/2016 Negative CBC Latest Ref Rng & Units 04/08/2017 02/08/2017 12/04/2016  WBC 4.0 - 10.5 K/uL  4.5 4.3 4.9  Hemoglobin 12.0 - 15.0 g/dL 12.1 12.9 13.0  Hematocrit 36.0 - 46.0 % 37.2 39.2 39.6  Platelets 150 - 400 K/uL 189 226 206   CMP Latest Ref Rng & Units 04/08/2017 02/08/2017 12/04/2016  Glucose 65 - 99 mg/dL 154(H) 146(H) 169(H)  BUN 6 - 20 mg/dL <5(L) 7 9  Creatinine 0.44 - 1.00 mg/dL 0.69 0.80 0.77  Sodium 135 - 145 mmol/L 137 132(L) 135  Potassium 3.5 - 5.1 mmol/L 3.6 3.6 3.7  Chloride 101 - 111 mmol/L 104 98(L) 99(L)  CO2 22 - 32 mmol/L 25 25 29   Calcium 8.9 - 10.3 mg/dL 9.3 9.6 9.6  Total Protein 6.5 - 8.1 g/dL 6.3(L) 6.7 6.4(L)  Total Bilirubin 0.3 - 1.2 mg/dL 0.5 0.5 0.5  Alkaline Phos 38 - 126 U/L 98 87 84  AST 15 - 41 U/L 22 25 22   ALT 14 - 54 U/L 10(L) 13(L) 13(L)    Imaging: No results found.  Speciality Comments: Carita Pian every 8 weeks started Jan 2018  TB gold negative 08/19/16    Procedures:  No procedures performed Allergies: Latex; Amlodipine besy-benazepril  hcl; Augmentin [amoxicillin-pot clavulanate]; Bextra [valdecoxib]; Lipitor [atorvastatin calcium]; Sulfa antibiotics; and Zocor [simvastatin]   Assessment / Plan:     Visit Diagnoses: Psoriatic arthritis ([place]): She has no active synovitis on examination. She continues to have arthralgias which are related to osteoarthritis.  Psoriasis: She has no active lesions.  High risk medication use - Simponi Aria IV, Arava 20 mg po qd. her labs have been stable except for mild elevation of blood glucose. Have advised to cut back on her sugar intake. We will continue to monitor her labs with her infusions.  H/O total knee replacement, left: Doing well  DDD (degenerative disc disease), cervical s/p fusion: She is limited range of motion of her C-spine.  DDD (degenerative disc disease), thoracic: She is thoracic kyphosis but not much discomfort.  DDD lumbar spine status post fusion: Limited range of motion.  Fibromyalgia: Doing fairly well.   Other medical problems are listed as follows:  RLS (restless legs syndrome)  History of OCD (obsessive compulsive disorder)  History of gastroesophageal reflux (GERD)  History of hyperlipidemia  History of hypertension  History of migraine  Other fatigue  Vitamin D deficiency -she is on vitamin D supplement. Plan: VITAMIN D 25 Hydroxy (Vit-D Deficiency, Fractures)    Orders: No orders of the defined types were placed in this encounter.  No orders of the defined types were placed in this encounter.   Face-to-face time spent with patient was 30 minutes. Greater than 50% of time was spent in counseling and coordination of care.  Follow-Up Instructions: Return in about 5 months (around 09/18/2017) for Psoriatic arthritis, Osteoarthritis.   Bo Merino, MD  Note - This record has been created using Editor, commissioning.  Chart creation errors have been sought, but may not always  have been located. Such creation errors do not reflect on  the  standard of medical care.

## 2017-04-20 ENCOUNTER — Ambulatory Visit (INDEPENDENT_AMBULATORY_CARE_PROVIDER_SITE_OTHER): Payer: Medicare Other | Admitting: Rheumatology

## 2017-04-20 ENCOUNTER — Encounter: Payer: Self-pay | Admitting: Rheumatology

## 2017-04-20 VITALS — BP 138/84 | HR 88 | Resp 16 | Ht 64.0 in | Wt 195.0 lb

## 2017-04-20 DIAGNOSIS — M5030 Other cervical disc degeneration, unspecified cervical region: Secondary | ICD-10-CM

## 2017-04-20 DIAGNOSIS — L4050 Arthropathic psoriasis, unspecified: Secondary | ICD-10-CM

## 2017-04-20 DIAGNOSIS — M19042 Primary osteoarthritis, left hand: Secondary | ICD-10-CM

## 2017-04-20 DIAGNOSIS — M5134 Other intervertebral disc degeneration, thoracic region: Secondary | ICD-10-CM

## 2017-04-20 DIAGNOSIS — Z8639 Personal history of other endocrine, nutritional and metabolic disease: Secondary | ICD-10-CM

## 2017-04-20 DIAGNOSIS — M797 Fibromyalgia: Secondary | ICD-10-CM

## 2017-04-20 DIAGNOSIS — M47816 Spondylosis without myelopathy or radiculopathy, lumbar region: Secondary | ICD-10-CM

## 2017-04-20 DIAGNOSIS — E559 Vitamin D deficiency, unspecified: Secondary | ICD-10-CM | POA: Diagnosis not present

## 2017-04-20 DIAGNOSIS — Z79899 Other long term (current) drug therapy: Secondary | ICD-10-CM | POA: Diagnosis not present

## 2017-04-20 DIAGNOSIS — L409 Psoriasis, unspecified: Secondary | ICD-10-CM | POA: Diagnosis not present

## 2017-04-20 DIAGNOSIS — Z8719 Personal history of other diseases of the digestive system: Secondary | ICD-10-CM

## 2017-04-20 DIAGNOSIS — Z8679 Personal history of other diseases of the circulatory system: Secondary | ICD-10-CM

## 2017-04-20 DIAGNOSIS — M19041 Primary osteoarthritis, right hand: Secondary | ICD-10-CM | POA: Diagnosis not present

## 2017-04-20 DIAGNOSIS — Z8669 Personal history of other diseases of the nervous system and sense organs: Secondary | ICD-10-CM

## 2017-04-20 DIAGNOSIS — G2581 Restless legs syndrome: Secondary | ICD-10-CM | POA: Diagnosis not present

## 2017-04-20 DIAGNOSIS — Z8659 Personal history of other mental and behavioral disorders: Secondary | ICD-10-CM | POA: Diagnosis not present

## 2017-04-20 DIAGNOSIS — Z96652 Presence of left artificial knee joint: Secondary | ICD-10-CM

## 2017-04-20 DIAGNOSIS — R5383 Other fatigue: Secondary | ICD-10-CM

## 2017-04-21 LAB — VITAMIN D 25 HYDROXY (VIT D DEFICIENCY, FRACTURES): Vit D, 25-Hydroxy: 45 ng/mL (ref 30–100)

## 2017-04-21 NOTE — Progress Notes (Signed)
Vitamin D is normal now. Patient can take vitamin D 2000 units by mouth over-the-counter or 50,000 units every month by prescription.

## 2017-04-23 ENCOUNTER — Telehealth: Payer: Self-pay | Admitting: *Deleted

## 2017-04-23 ENCOUNTER — Telehealth (INDEPENDENT_AMBULATORY_CARE_PROVIDER_SITE_OTHER): Payer: Self-pay

## 2017-04-23 MED ORDER — VITAMIN D (ERGOCALCIFEROL) 1.25 MG (50000 UNIT) PO CAPS: 50000.0000 [IU] | ORAL_CAPSULE | ORAL | 0 refills | Status: DC

## 2017-04-23 NOTE — Telephone Encounter (Signed)
-----   Message from Bo Merino, MD sent at 04/21/2017  8:51 AM EST ----- Vitamin D is normal now. Patient can take vitamin D 2000 units by mouth over-the-counter or 50,000 units every month by prescription.

## 2017-04-23 NOTE — Telephone Encounter (Signed)
Patient was calling concerning lab results.  Would like a call back, if no answer, please leave a message?  Cb# is [phone].  Please advise.  Thank you.

## 2017-04-26 NOTE — Telephone Encounter (Signed)
Vitamin D is normal now. Patient can take vitamin D 2000 units by mouth over-the-counter or 50,000 units every month by prescription. Patient advised and verbalized understanding.

## 2017-05-04 DIAGNOSIS — H524 Presbyopia: Secondary | ICD-10-CM | POA: Diagnosis not present

## 2017-05-04 DIAGNOSIS — H04123 Dry eye syndrome of bilateral lacrimal glands: Secondary | ICD-10-CM | POA: Diagnosis not present

## 2017-05-04 DIAGNOSIS — H52223 Regular astigmatism, bilateral: Secondary | ICD-10-CM | POA: Diagnosis not present

## 2017-05-04 DIAGNOSIS — Z961 Presence of intraocular lens: Secondary | ICD-10-CM | POA: Diagnosis not present

## 2017-05-04 DIAGNOSIS — H5213 Myopia, bilateral: Secondary | ICD-10-CM | POA: Diagnosis not present

## 2017-05-10 ENCOUNTER — Encounter: Payer: Self-pay | Admitting: Family Medicine

## 2017-05-10 ENCOUNTER — Ambulatory Visit (INDEPENDENT_AMBULATORY_CARE_PROVIDER_SITE_OTHER): Payer: Medicare Other | Admitting: Family Medicine

## 2017-05-10 ENCOUNTER — Other Ambulatory Visit: Payer: Self-pay | Admitting: Rheumatology

## 2017-05-10 VITALS — BP 136/94 | HR 90 | Temp 98.4°F | Resp 14 | Ht 64.0 in | Wt 197.0 lb

## 2017-05-10 DIAGNOSIS — I10 Essential (primary) hypertension: Secondary | ICD-10-CM | POA: Diagnosis not present

## 2017-05-10 MED ORDER — HYDROCHLOROTHIAZIDE 25 MG PO TABS: 25.0000 mg | ORAL_TABLET | Freq: Every day | ORAL | 3 refills | Status: DC

## 2017-05-10 NOTE — Telephone Encounter (Signed)
Last Visit: 04/20/17 Next Visit: 09/23/17 Labs: 04/08/17 Stable  Okay to refill per Dr. Estanislado Pandy

## 2017-05-10 NOTE — Progress Notes (Signed)
Subjective:    Patient ID: Janet Peterson, female    DOB: Sep 23, 1950, 66 y.o.   MRN: [id number]  HPI Patient is on valsartan 160 mg p.o. daily for hypertension coupled with metoprolol XL.  She also takes Lasix 40 mg 1-2 days a week for swelling in her legs.  She only takes her potassium supplement when she takes her Lasix.  Recently she has been noticing her blood pressure is running consistently high.  Her blood pressure has been averaging between 140 and 160/90-100.  She denies any chest pain shortness of breath or dyspnea on exertion Past Medical History:  Diagnosis Date  . Ankylosing spondylitis ([place])   . Anxiety and depression   . Depression   . Fibromyalgia   . GERD (gastroesophageal reflux disease)   . History of basal cell carcinoma excision    FACE, [place]  . History of hiatal hernia   . History of thrush   . Hyperlipidemia   . Hypertension   . Insomnia   . OCD (obsessive compulsive disorder)   . OSA (obstructive sleep apnea)    MILD AND NO CPAP SINCE SURGERY IN 2007  . Osteopenia   . PONV (postoperative nausea and vomiting)   . Psoriatic arthritis ([place])   . PVC (premature ventricular contraction)   . Rheumatoid arthritis [facility])    Past Surgical History:  Procedure Laterality Date  . BREAST SURGERY  1975   lumpectomy-- benign  . BUNIONECTOMY  1991  . CATARACT EXTRACTION W/ INTRAOCULAR LENS  IMPLANT, BILATERAL  2000  . CERVICAL FUSION  March 2013   C5 -- C7  . CESAREAN SECTION  1985  . DISTAL INTERPHALANGEAL JOINT FUSION Right 03/14/2015   Procedure: RIGHT LONG FINGER DISTAL INTERPHALANGEAL JOINT ARTHRODESIS;  Surgeon: Iran Planas, MD;  Location: [place];  Service: Orthopedics;  Laterality: Right;  . EYE SURGERY  1995   rk (laser surgery), semi cornea transplant, detacted retina,  fluid removal  . KNEE ARTHROSCOPY Left 03-03-2004  . POSTERIOR VITRECTOMY RIGHT EYE AND LASER   10-27-1999  . SHOULDER SURGERY Right 1997  . SPINAL FIXATION  SURGERY W/ IMPLANT  2013 rod #1//  2014  rod 2   S1 -- T10  (rod #1)//   S1 -- T4 (rod #2)  . THORACIC FUSION  03-13-2013   REMOVAL HARDWARE/  BONE GRAFT FUSION T10//  REVISION OF RODS  . TOTAL KNEE ARTHROPLASTY Left 12-14-2005  . UVULOPALATOPHARYNGOPLASTY  04-26-2006   w/  TONSILLECTOMY/  TURBINATE REDUCTIONS/  BILATERAL ANTERIOR ETHMOIDECTOMY   Current Outpatient Medications on File Prior to Visit  Medication Sig Dispense Refill  . baclofen (LIORESAL) 10 MG tablet Take 10 mg by mouth 2 (two) times daily. Reported on 12/09/2015    . Brexpiprazole (REXULTI) 2 MG TABS Take 2 mg by mouth daily.    Marland Kitchen buPROPion (WELLBUTRIN XL) 300 MG 24 hr tablet Take 300 mg by mouth every morning.  1  . clomiPRAMINE (ANAFRANIL) 75 MG capsule Take 75 mg by mouth 2 (two) times daily.  2  . clonazePAM (KLONOPIN) 1 MG tablet TAKE 1 TABLET BY MOUTH EVERY DAY AS NEEDED FOR ANXIETY  1  . DEXILANT 60 MG capsule TAKE 1 CAPSULE BY MOUTH EVERY DAY 90 capsule 3  . ezetimibe (ZETIA) 10 MG tablet TAKE 1 TABLET BY MOUTH EVERY DAY 90 tablet 3  . furosemide (LASIX) 40 MG tablet TAKE 1 TABLET (40 MG TOTAL) BY MOUTH DAILY. 30 tablet 9  .  KLOR-CON M20 20 MEQ tablet TAKE 1 TABLET BY MOUTH EVERY DAY 90 tablet 3  . metoprolol succinate (TOPROL-XL) 50 MG 24 hr tablet TAKE 1 TABLET BY MOUTH EVERY DAY WITH OR IMMEDIATELY FOLLOWING A MEAL 90 tablet 3  . oxyCODONE-acetaminophen (PERCOCET) 5-325 MG tablet Take 1 tablet every 4 (four) hours as needed by mouth. 60 tablet 0  . pravastatin (PRAVACHOL) 20 MG tablet TAKE 1 TABLET BY MOUTH DAILY (Patient taking differently: TAKE 1 TABLET BY MOUTH DAILY--- TAKES IN PM) 90 tablet 3  . sucralfate (CARAFATE) 1 G tablet Take 1 tablet (1 g total) by mouth 4 (four) times daily -  with meals and at bedtime. 120 tablet 3  . SUMAtriptan (IMITREX) 50 MG tablet TAKE 1 TABLET BY MOUTH ONCE DAILY AS NEEDED FOR HEADACHES 30 tablet 0  . traZODone (DESYREL) 100 MG tablet Take 100 mg by mouth at bedtime.     .  valsartan (DIOVAN) 160 MG tablet Take 160 mg by mouth daily.    . Vitamin D, Ergocalciferol, (DRISDOL) 50000 units CAPS capsule Take 1 capsule (50,000 Units total) by mouth every 30 (thirty) days. 6 capsule 0  . [DISCONTINUED] Golimumab 50 MG/0.5ML SOAJ Inject 0.5 mLs into the skin every 30 (thirty) days. 1 Syringe 0   No current facility-administered medications on file prior to visit.    Allergies  Allergen Reactions  . Latex Rash    And Mouth sores  . Amlodipine Besy-Benazepril Hcl Other (See Comments)    COUGH  . Augmentin [Amoxicillin-Pot Clavulanate] Diarrhea and Nausea And Vomiting  . Bextra [Valdecoxib] Diarrhea and Nausea And Vomiting    REFLUX  . Lipitor [Atorvastatin Calcium] Other (See Comments)    MYALGIAS  . Sulfa Antibiotics Nausea And Vomiting    CRAMPING  . Zocor [Simvastatin] Other (See Comments)    MYALGIA   Social History   Socioeconomic History  . Marital status: Married    Spouse name: Not on file  . Number of children: Not on file  . Years of education: Not on file  . Highest education level: Not on file  Social Needs  . Financial resource strain: Not on file  . Food insecurity - worry: Not on file  . Food insecurity - inability: Not on file  . Transportation needs - medical: Not on file  . Transportation needs - non-medical: Not on file  Occupational History  . Not on file  Tobacco Use  . Smoking status: Former Smoker    Packs/day: 0.50    Years: 10.00    Pack years: 5.00    Types: Cigarettes    Last attempt to quit: 03/07/1995    Years since quitting: 22.1  . Smokeless tobacco: Never Used  Substance and Sexual Activity  . Alcohol use: Yes    Comment: rare  . Drug use: No  . Sexual activity: Not on file  Other Topics Concern  . Not on file  Social History Narrative  . Not on file      Review of Systems  All other systems reviewed and are negative.      Objective:   Physical Exam  Constitutional: She appears well-developed and  well-nourished. No distress.  Neck: Neck supple.  Cardiovascular: Normal rate, regular rhythm and normal heart sounds.  No murmur heard. Pulmonary/Chest: Effort normal and breath sounds normal. No respiratory distress. She has no wheezes. She has no rales.  Musculoskeletal: She exhibits no edema.  Skin: She is not diaphoretic.  Vitals reviewed.  Assessment & Plan:  Benign essential HTN  Patient's blood pressure is elevated.  I will have her discontinue Lasix which she uses sparingly for leg swelling.  I will also have her discontinue potassium supplementation which she only takes when she uses her Lasix.  Instead I will have her use hydrochlorothiazide 25 mg p.o. daily.  Hopefully we can successfully lower her blood pressure and help with the swelling simultaneously.  Recheck blood pressure as well as a BMP in 1 month

## 2017-05-12 ENCOUNTER — Other Ambulatory Visit: Payer: Self-pay | Admitting: Family Medicine

## 2017-05-12 DIAGNOSIS — L720 Epidermal cyst: Secondary | ICD-10-CM | POA: Diagnosis not present

## 2017-05-12 DIAGNOSIS — Z85828 Personal history of other malignant neoplasm of skin: Secondary | ICD-10-CM | POA: Diagnosis not present

## 2017-05-12 DIAGNOSIS — L820 Inflamed seborrheic keratosis: Secondary | ICD-10-CM | POA: Diagnosis not present

## 2017-05-12 MED ORDER — EZETIMIBE 10 MG PO TABS: 10.0000 mg | ORAL_TABLET | Freq: Every day | ORAL | 3 refills | Status: DC

## 2017-05-20 ENCOUNTER — Telehealth: Payer: Self-pay | Admitting: Family Medicine

## 2017-05-20 NOTE — Telephone Encounter (Signed)
Pt called Janet Peterson stating that she has not seen much change in her BP since starting HCTZ her BP average is 145-146/95.

## 2017-05-21 NOTE — Telephone Encounter (Signed)
I'd give it a full month to take full effect (has been 10 days).  If no better in 1 month (jan 17) would adjust med further.

## 2017-05-21 NOTE — Telephone Encounter (Signed)
Patient aware of providers recommendations.  

## 2017-06-01 ENCOUNTER — Other Ambulatory Visit: Payer: Self-pay | Admitting: Family Medicine

## 2017-06-01 MED ORDER — OXYCODONE-ACETAMINOPHEN 5-325 MG PO TABS: 1.0000 | ORAL_TABLET | ORAL | 0 refills | Status: DC | PRN

## 2017-06-01 NOTE — Telephone Encounter (Signed)
Patient requesting a refill on Oxycodone     LOV:  05/10/17  LRF:  04/05/17

## 2017-06-02 ENCOUNTER — Telehealth: Payer: Self-pay | Admitting: Rheumatology

## 2017-06-02 NOTE — Telephone Encounter (Signed)
Patient called stating that she had two teeth extracted yesterday and wanted to know how long she should wait before taking her Simponi.  CB # [phone]

## 2017-06-03 ENCOUNTER — Inpatient Hospital Stay (HOSPITAL_COMMUNITY): Admission: RE | Admit: 2017-06-03 | Payer: Medicare Other | Source: Ambulatory Visit

## 2017-06-03 NOTE — Telephone Encounter (Signed)
Patient advised to wait one week after extraction and then give the Simponi.

## 2017-06-03 NOTE — Telephone Encounter (Signed)
One week

## 2017-06-08 ENCOUNTER — Telehealth: Payer: Self-pay

## 2017-06-08 NOTE — Telephone Encounter (Signed)
Called pt to verify benefits for 2019. She states that her insurance will remain the same [employer], petroleum [employer]).   A BIV was complete through [employer] for [employer]. A prior auth/pre-cert will not be required for pt to receive infusions at [facility].   Will send documents to scan center.  Hopkins, [place], CPhT 4:00 PM

## 2017-06-10 ENCOUNTER — Telehealth: Payer: Self-pay | Admitting: Family Medicine

## 2017-06-10 NOTE — Telephone Encounter (Signed)
Pt called Janet Peterson stating that her BP has not changed much still at 146/94 average.

## 2017-06-10 NOTE — Telephone Encounter (Signed)
Up diovan to 320 mg a day.

## 2017-06-11 NOTE — Telephone Encounter (Signed)
LMTRC

## 2017-06-14 MED ORDER — VALSARTAN 320 MG PO TABS: 320.0000 mg | ORAL_TABLET | Freq: Every day | ORAL | 3 refills | Status: DC

## 2017-06-14 NOTE — Telephone Encounter (Signed)
Medication called/sent to requested pharmacy and pt aware 

## 2017-06-17 ENCOUNTER — Other Ambulatory Visit: Payer: Self-pay | Admitting: *Deleted

## 2017-06-17 ENCOUNTER — Ambulatory Visit (HOSPITAL_COMMUNITY)
Admission: RE | Admit: 2017-06-17 | Discharge: 2017-06-17 | Disposition: A | Discharge status: Home / Self Care | Payer: Medicare Other | Source: Ambulatory Visit | Attending: Rheumatology | Admitting: Rheumatology

## 2017-06-17 DIAGNOSIS — L409 Psoriasis, unspecified: Secondary | ICD-10-CM

## 2017-06-17 DIAGNOSIS — L4050 Arthropathic psoriasis, unspecified: Secondary | ICD-10-CM

## 2017-06-17 LAB — COMPREHENSIVE METABOLIC PANEL
ALT: 13 U/L — ABNORMAL LOW (ref 14–54)
AST: 22 U/L (ref 15–41)
Albumin: 3.5 g/dL (ref 3.5–5.0)
Alkaline Phosphatase: 99 U/L (ref 38–126)
Anion gap: 9 (ref 5–15)
BUN: 10 mg/dL (ref 6–20)
CO2: 26 mmol/L (ref 22–32)
Calcium: 9.5 mg/dL (ref 8.9–10.3)
Chloride: 97 mmol/L — ABNORMAL LOW (ref 101–111)
Creatinine, Ser: 0.7 mg/dL (ref 0.44–1.00)
GFR calc Af Amer: >60 mL/min (ref >60)
GFR calc non Af Amer: >60 mL/min (ref >60)
Glucose, Bld: 123 mg/dL — ABNORMAL HIGH (ref 65–99)
Potassium: 3.7 mmol/L (ref 3.5–5.1)
Sodium: 132 mmol/L — ABNORMAL LOW (ref 135–145)
Total Bilirubin: 0.5 mg/dL (ref 0.3–1.2)
Total Protein: 6.2 g/dL — ABNORMAL LOW (ref 6.5–8.1)

## 2017-06-17 LAB — CBC
HCT: 37.2 % (ref 36.0–46.0)
Hemoglobin: 12.5 g/dL (ref 12.0–15.0)
MCH: 31.2 pg (ref 26.0–34.0)
MCHC: 33.6 g/dL (ref 30.0–36.0)
MCV: 92.8 fL (ref 78.0–100.0)
Platelets: 225 10*3/uL (ref 150–400)
RBC: 4.01 MIL/uL (ref 3.87–5.11)
RDW: 13.5 % (ref 11.5–15.5)
WBC: 4.3 10*3/uL (ref 4.0–10.5)

## 2017-06-17 MED ORDER — SODIUM CHLORIDE 0.9 % IV SOLN
2.0000 mg/kg | Freq: Once | INTRAVENOUS | Status: DC
  Administered 2017-06-17: 177.5 mg via INTRAVENOUS
  Filled 2017-06-17: qty 14.2

## 2017-06-17 MED ORDER — ACETAMINOPHEN 325 MG PO TABS: 650.0000 mg | ORAL_TABLET | ORAL | Status: DC

## 2017-06-17 MED ORDER — DIPHENHYDRAMINE HCL 25 MG PO CAPS: 25.0000 mg | ORAL_CAPSULE | ORAL | Status: DC

## 2017-06-17 NOTE — Progress Notes (Signed)
Labs are stable.

## 2017-07-26 ENCOUNTER — Other Ambulatory Visit: Payer: Self-pay | Admitting: Family Medicine

## 2017-07-26 MED ORDER — OXYCODONE-ACETAMINOPHEN 5-325 MG PO TABS: 1.0000 | ORAL_TABLET | ORAL | 0 refills | Status: DC | PRN

## 2017-07-26 NOTE — Telephone Encounter (Signed)
Patient requesting a refill on Oxycodone     LOV: 05/10/17  LRF:  06/01/17

## 2017-07-28 ENCOUNTER — Other Ambulatory Visit: Payer: Self-pay | Admitting: *Deleted

## 2017-07-28 NOTE — Progress Notes (Signed)
Infusion orders are current for patient CBC CMP Tylenol Benadryl appointments are up to date and follow up appointment  is scheduled TB gold not due yet.  

## 2017-08-05 ENCOUNTER — Other Ambulatory Visit: Payer: Self-pay | Admitting: Rheumatology

## 2017-08-05 NOTE — Telephone Encounter (Signed)
Last Visit: 04/20/17 Next Visit: 09/23/17 Labs: 06/17/17 Stable  Okay to refill per Dr.Deveshwar

## 2017-08-12 ENCOUNTER — Inpatient Hospital Stay (HOSPITAL_COMMUNITY): Admission: RE | Admit: 2017-08-12 | Payer: Medicare Other | Source: Ambulatory Visit

## 2017-08-16 ENCOUNTER — Encounter (HOSPITAL_COMMUNITY)
Admission: RE | Admit: 2017-08-16 | Discharge: 2017-08-16 | Disposition: A | Discharge status: Home / Self Care | Payer: Medicare Other | Source: Ambulatory Visit | Attending: Rheumatology | Admitting: Rheumatology

## 2017-08-16 DIAGNOSIS — L409 Psoriasis, unspecified: Secondary | ICD-10-CM | POA: Diagnosis present

## 2017-08-16 DIAGNOSIS — L4050 Arthropathic psoriasis, unspecified: Secondary | ICD-10-CM | POA: Diagnosis present

## 2017-08-16 LAB — CBC
HCT: 35 % — ABNORMAL LOW (ref 36.0–46.0)
Hemoglobin: 11.7 g/dL — ABNORMAL LOW (ref 12.0–15.0)
MCH: 30.9 pg (ref 26.0–34.0)
MCHC: 33.4 g/dL (ref 30.0–36.0)
MCV: 92.3 fL (ref 78.0–100.0)
Platelets: 205 10*3/uL (ref 150–400)
RBC: 3.79 MIL/uL — ABNORMAL LOW (ref 3.87–5.11)
RDW: 13.8 % (ref 11.5–15.5)
WBC: 3.8 10*3/uL — ABNORMAL LOW (ref 4.0–10.5)

## 2017-08-16 LAB — COMPREHENSIVE METABOLIC PANEL
ALT: 12 U/L — ABNORMAL LOW (ref 14–54)
AST: 21 U/L (ref 15–41)
Albumin: 3.5 g/dL (ref 3.5–5.0)
Alkaline Phosphatase: 79 U/L (ref 38–126)
Anion gap: 8 (ref 5–15)
BUN: 5 mg/dL — ABNORMAL LOW (ref 6–20)
CO2: 27 mmol/L (ref 22–32)
Calcium: 9.5 mg/dL (ref 8.9–10.3)
Chloride: 99 mmol/L — ABNORMAL LOW (ref 101–111)
Creatinine, Ser: 0.67 mg/dL (ref 0.44–1.00)
GFR calc Af Amer: >60 mL/min (ref >60)
GFR calc non Af Amer: >60 mL/min (ref >60)
Glucose, Bld: 121 mg/dL — ABNORMAL HIGH (ref 65–99)
Potassium: 3.8 mmol/L (ref 3.5–5.1)
Sodium: 134 mmol/L — ABNORMAL LOW (ref 135–145)
Total Bilirubin: 0.5 mg/dL (ref 0.3–1.2)
Total Protein: 6 g/dL — ABNORMAL LOW (ref 6.5–8.1)

## 2017-08-16 MED ORDER — SODIUM CHLORIDE 0.9 % IV SOLN
2.0000 mg/kg | Freq: Once | INTRAVENOUS | Status: AC
  Administered 2017-08-16: 177.5 mg via INTRAVENOUS
  Filled 2017-08-16: qty 14.2

## 2017-08-16 MED ORDER — ACETAMINOPHEN 325 MG PO TABS: 650.0000 mg | ORAL_TABLET | ORAL | Status: DC

## 2017-08-16 MED ORDER — DIPHENHYDRAMINE HCL 25 MG PO CAPS: 25.0000 mg | ORAL_CAPSULE | ORAL | Status: DC

## 2017-08-16 NOTE — Progress Notes (Signed)
WBCs are mildly decreased.  We will continue to monitor.

## 2017-08-24 ENCOUNTER — Telehealth: Payer: Self-pay | Admitting: Family Medicine

## 2017-08-24 NOTE — Telephone Encounter (Signed)
40 mg of omeprazole.

## 2017-08-24 NOTE — Telephone Encounter (Signed)
Pt called and states that her Dexilant has gone up to $400 and wanted to know if we could replace with Omeprazole as it would be a lot cheaper and how much of the omeprazole should she take to be comparable to the [place]?

## 2017-08-26 MED ORDER — OMEPRAZOLE 40 MG PO CPDR: 40.0000 mg | DELAYED_RELEASE_CAPSULE | Freq: Every day | ORAL | 3 refills | Status: DC

## 2017-08-26 NOTE — Telephone Encounter (Signed)
Medication called/sent to requested pharmacy and pt aware 

## 2017-09-13 NOTE — Progress Notes (Signed)
Office Visit Note  Patient: Janet Peterson             Date of Birth: 25-Apr-1951           MRN: [id number]             PCP: Susy Frizzle, MD Referring: Susy Frizzle, MD Visit Date: 09/23/2017 Occupation: @GUAROCC @    Subjective:  Generalized muscle tenderness   History of Present Illness: Janet Peterson is a 68 y.o. female with history of psoriatic arthritis, fibromyalgia, and DDD.  Patient continues to get [facility] Aria infusions every 8 weeks and takes Arava 20 mg daily.  She denies any joint pain or joint swelling at this time.  She does not have any concerns and has been very happy with her combination of medications.  She denies any recent flares of her psoriasis.  She denies any plantar fasciitis or Achilles tendinitis.  She denies any SI joint pain.  She continues to have lower back pain that is chronic due to post surgery changes.  She says she has pain in her lower back when lying flat.  She states she continues to have muscle tenderness and worsening fatigue with fibromyalgia.   Activities of Daily Living:  Patient reports morning stiffness for 1  hour.   Patient Denies nocturnal pain.  Difficulty dressing/grooming: Denies Difficulty climbing stairs: Reports Difficulty getting out of chair: Denies Difficulty using hands for taps, buttons, cutlery, and/or writing: Denies   Review of Systems  Constitutional: Positive for fatigue.  HENT: Negative for mouth sores, mouth dryness and nose dryness.   Eyes: Negative for pain, visual disturbance and dryness.  Respiratory: Negative for cough, hemoptysis, shortness of breath and difficulty breathing.   Cardiovascular: Negative for chest pain, palpitations, hypertension and swelling in legs/feet.  Gastrointestinal: Negative for blood in stool, constipation and diarrhea.  Endocrine: Negative for increased urination.  Genitourinary: Negative for painful urination.  Musculoskeletal: Positive for morning stiffness. Negative for  arthralgias, joint pain, joint swelling, myalgias, muscle weakness, muscle tenderness and myalgias.  Skin: Negative for color change, pallor, rash, hair loss, nodules/bumps, skin tightness, ulcers and sensitivity to sunlight.  Allergic/Immunologic: Negative for susceptible to infections.  Neurological: Negative for dizziness, numbness, headaches and weakness.  Hematological: Negative for swollen glands.  Psychiatric/Behavioral: Positive for depressed mood. Negative for sleep disturbance. The patient is nervous/anxious.     PMFS History:  Patient Active Problem List   Diagnosis Date Noted  . DDD (degenerative disc disease), cervical s/p fusion 11/12/2016  . H/O total knee replacement, left 05/06/2016  . DDD lumbar spine status post fusion 05/06/2016  . Psoriasis 05/05/2016  . High risk medication use 05/05/2016  . Cervical post-laminectomy syndrome 12/09/2015  . Thoracic postlaminectomy syndrome 12/09/2015  . Psoriatic arthritis ([place]) 08/23/2012  . Osteopenia 08/23/2012  . HLD (hyperlipidemia) 08/23/2012  . RLS (restless legs syndrome) 08/23/2012  . Insomnia 08/23/2012  . Fibromyalgia syndrome 08/12/2012  . Low back pain 08/12/2012  . Syncope   . PVC (premature ventricular contraction)   . OCD (obsessive compulsive disorder)   . GERD (gastroesophageal reflux disease)   . Hypertension     Past Medical History:  Diagnosis Date  . Ankylosing spondylitis ([place])   . Anxiety and depression   . Depression   . Fibromyalgia   . GERD (gastroesophageal reflux disease)   . History of basal cell carcinoma excision    FACE, [place]  . History of hiatal hernia   .  History of thrush   . Hyperlipidemia   . Hypertension   . Insomnia   . OCD (obsessive compulsive disorder)   . OSA (obstructive sleep apnea)    MILD AND NO CPAP SINCE SURGERY IN 2007  . Osteopenia   . PONV (postoperative nausea and vomiting)   . Psoriatic arthritis ([place])   . PVC (premature ventricular contraction)     . Rheumatoid arthritis ([place])     Family History  Problem Relation Age of Onset  . Diabetes Mother   . Heart disease Mother   . Diabetes Father   . Anuerysm Father   . Diabetes Brother   . Heart disease Brother   . Diabetes Brother   . Heart disease Brother    Past Surgical History:  Procedure Laterality Date  . BREAST SURGERY  1975   lumpectomy-- benign  . BUNIONECTOMY  1991  . CATARACT EXTRACTION W/ INTRAOCULAR LENS  IMPLANT, BILATERAL  2000  . CERVICAL FUSION  March 2013   C5 -- C7  . CESAREAN SECTION  1985  . DISTAL INTERPHALANGEAL JOINT FUSION Right 03/14/2015   Procedure: RIGHT LONG FINGER DISTAL INTERPHALANGEAL JOINT ARTHRODESIS;  Surgeon: Iran Planas, MD;  Location: [facility];  Service: Orthopedics;  Laterality: Right;  . EYE SURGERY  1995   rk (laser surgery), semi cornea transplant, detacted retina,  fluid removal  . KNEE ARTHROSCOPY Left 03-03-2004  . POSTERIOR VITRECTOMY RIGHT EYE AND LASER   10-27-1999  . SHOULDER SURGERY Right 1997  . SPINAL FIXATION SURGERY W/ IMPLANT  2013 rod #1//  2014  rod 2   S1 -- T10  (rod #1)//   S1 -- T4 (rod #2)  . THORACIC FUSION  03-13-2013   REMOVAL HARDWARE/  BONE GRAFT FUSION T10//  REVISION OF RODS  . TOTAL KNEE ARTHROPLASTY Left 12-14-2005  . UVULOPALATOPHARYNGOPLASTY  04-26-2006   w/  TONSILLECTOMY/  TURBINATE REDUCTIONS/  BILATERAL ANTERIOR ETHMOIDECTOMY   Social History   Social History Narrative  . Not on file     Objective: Vital Signs: BP 113/81 (BP Location: Left Arm, Patient Position: Sitting, Cuff Size: Normal)   Pulse 86   Resp 15   Ht 5\' 4"  (1.626 m)   Wt 194 lb (88 kg)   BMI 33.30 kg/m    Physical Exam  Constitutional: She is oriented to person, place, and time. She appears well-developed and well-nourished.  HENT:  Head: Normocephalic and atraumatic.  Eyes: Conjunctivae and EOM are normal.  Neck: Normal range of motion.  Cardiovascular: Normal rate, regular rhythm, normal heart  sounds and intact distal pulses.  Pulmonary/Chest: Effort normal and breath sounds normal.  Abdominal: Soft. Bowel sounds are normal.  Lymphadenopathy:    She has no cervical adenopathy.  Neurological: She is alert and oriented to person, place, and time.  Skin: Skin is warm and dry. Capillary refill takes less than 2 seconds.  Psychiatric: She has a normal mood and affect. Her behavior is normal.  Nursing note and vitals reviewed.    Musculoskeletal Exam: C-spine limited ROM.  Thoracic and lumbar spine limited range of motion.  No midline spinal tenderness.  No SI joint tenderness. Shoulder joints, elbow joints, wrist joints, MCPs, PIPs, and DIPs good ROM. Complete fist formation.  DIP synovial thickening but no synovitis.  Limited ROM of left hip joint.  knee joints, ankle joints, MTPs, PIPs, and DIPs good ROM with no synovitis. No warmth or effusion of knee joints.  CDAI Exam: CDAI Homunculus Exam:  Joint Counts:  CDAI Tender Joint count: 0 CDAI Swollen Joint count: 0  Global Assessments:  Patient Global Assessment: 2 Provider Global Assessment: 2  CDAI Calculated Score: 4    Investigation: No additional findings.TB Gold: 11/13/2016 Negative  CBC Latest Ref Rng & Units 08/16/2017 06/17/2017 04/08/2017  WBC 4.0 - 10.5 K/uL 3.8(L) 4.3 4.5  Hemoglobin 12.0 - 15.0 g/dL 11.7(L) 12.5 12.1  Hematocrit 36.0 - 46.0 % 35.0(L) 37.2 37.2  Platelets 150 - 400 K/uL 205 225 189   CMP Latest Ref Rng & Units 08/16/2017 06/17/2017 04/08/2017  Glucose 65 - 99 mg/dL 121(H) 123(H) 154(H)  BUN 6 - 20 mg/dL 5(L) 10 <5(L)  Creatinine 0.44 - 1.00 mg/dL 0.67 0.70 0.69  Sodium 135 - 145 mmol/L 134(L) 132(L) 137  Potassium 3.5 - 5.1 mmol/L 3.8 3.7 3.6  Chloride 101 - 111 mmol/L 99(L) 97(L) 104  CO2 22 - 32 mmol/L 27 26 25   Calcium 8.9 - 10.3 mg/dL 9.5 9.5 9.3  Total Protein 6.5 - 8.1 g/dL 6.0(L) 6.2(L) 6.3(L)  Total Bilirubin 0.3 - 1.2 mg/dL 0.5 0.5 0.5  Alkaline Phos 38 - 126 U/L 79 99 98  AST 15  - 41 U/L 21 22 22   ALT 14 - 54 U/L 12(L) 13(L) 10(L)    Imaging: Xr Foot 2 Views Left  Result Date: 09/23/2017 1st MTP, all PIP, and all DIP joint space narrowing. No erosive changes. Tibitalar joint space narroiwng. No subtalar joint space narrowing. No intertarsal joint space narrowing.  Small calcaneal spur.  Impression: Findings are consistent with osteoarthritis.    Xr Foot 2 Views Right  Result Date: 09/23/2017 1st MTP narrowing and subluxation.  All PIP and DIP joint narrowing. No intertarsal joint space narrowing. Hardware present in fibula and screws in tibitalar joint.  No subtalar joint space narrowing. No erosive changes. Impression: Findings are consistent with osteoarthritis.   Xr Hand 2 View Left  Result Date: 09/23/2017 Juxta-articular osteopenia.  All PIP and all DIP joint space narrowing.  Severe narrowing of 1st, 2nd, and 4th DIP joints.  No erosive changes.  No radiocarpal or intercarpal joint space narrowing. 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   Xr Hand 2 View Right  Result Date: 09/23/2017 Juxta-articular osteopenia. All PIP and DIP joint space narrowing.  1st MCP joint space narrowing. Ankylosis of 2nd DIP joint. CMC joint space narrowing.  No radiocarpal or intercarpal joint space narrowing. No erosions noted. 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    Speciality Comments: Carita Pian every 8 weeks started Jan 2018  TB gold negative 08/19/16    Procedures:  No procedures performed Allergies: Latex; Amlodipine besy-benazepril hcl; Augmentin [amoxicillin-pot clavulanate]; Bextra [valdecoxib]; Lipitor [atorvastatin calcium]; Sulfa antibiotics; and Zocor [simvastatin]   Assessment / Plan:     Visit Diagnoses: Psoriatic arthritis ([place]) -She has no synovitis or dactylitis on exam.  She has no joint pain or joint swelling at this time.  She will continue getting Simponi  Aria infusions every 8 weeks and taking Arava 20 mg daily.  She does not need any refills of Arava at this time.  X-rays of her hands and feet were obtained today to monitor disease progression.  X-rays of bilateral hands revealed findings consistent with ulcer arthritis and psoriatic arthritis overlap.  No erosive changes were noted.  X-rays of her feet revealed osteoarthritic changes no erosions are present.  Plan: XR Hand 2 View Left, XR Hand 2 View Right, XR  Foot 2 Views Left, XR Foot 2 Views Right  Psoriasis: She has no active psoriasis at this time.  High risk medication use - Simponi Aria IV, Arava 20 mg po qd. Most recent CBC and CMP was drawn on 08/16/2017.  She has labs drawn with her infusions.  TB gold will be drawn with her next labs.  TB gold was negative on 11/13/16.  Future order for TB gold was placed today.  H/O total knee replacement, left: Doing well.  No warmth or effusion.  She is no discomfort at this time.  DDD (degenerative disc disease), cervical s/p fusion: Limited range of motion of her C-spine.  She has no discomfort at this time.  DDD lumbar spine status post fusion: Chronic pain.  She has limited range of motion.  She experiences discomfort when she is lying flat at night.  DDD (degenerative disc disease), thoracic: No midline spinal tenderness in the thoracic region.  Fibromyalgia: She continues to have generalized muscle aches and muscle tenderness.  Fatigue.  RLS (restless legs syndrome)  Other fatigue: Chronic.   Other medical conditions are listed as follows:  History of OCD (obsessive compulsive disorder)  History of migraine  History of gastroesophageal reflux (GERD)  History of hypertension  History of hyperlipidemia  History of vitamin D deficiency: She takes vitamin D 50,000 units once a month.  A refill vitamin D was sent to the pharmacy.   Orders: Orders Placed This Encounter  Procedures  . XR Hand 2 View Left  . XR Hand 2 View Right  .  XR Foot 2 Views Left  . XR Foot 2 Views Right  . QuantiFERON-TB Gold Plus   Meds ordered this encounter  Medications  . Vitamin D, Ergocalciferol, (DRISDOL) 50000 units CAPS capsule    Sig: Take 1 capsule (50,000 Units total) by mouth every 30 (thirty) days.    Dispense:  6 capsule    Refill:  0    Face-to-face time spent with patient was 30 minutes. >50% of time was spent in counseling and coordination of care.  Follow-Up Instructions: Return in about 5 months (around 02/23/2018) for Psoriatic arthritis, Fibromyalgia, DDD.   Ofilia Neas, PA-C   I examined and evaluated the patient with Hazel Sams PA.Patient had no synovitis on my exam. Xray findings were discussed with patient .The plan of care was discussed as noted above.  Bo Merino, MD  Note - This record has been created using Editor, commissioning.  Chart creation errors have been sought, but may not always  have been located. Such creation errors do not reflect on  the standard of medical care.

## 2017-09-20 ENCOUNTER — Other Ambulatory Visit: Payer: Self-pay | Admitting: Family Medicine

## 2017-09-20 NOTE — Telephone Encounter (Signed)
Patient requesting a refill on Oxycodone     LOV:  05/10/17  LRF:     07/26/17

## 2017-09-21 MED ORDER — OXYCODONE-ACETAMINOPHEN 5-325 MG PO TABS: 1.0000 | ORAL_TABLET | ORAL | 0 refills | Status: DC | PRN

## 2017-09-22 ENCOUNTER — Ambulatory Visit (INDEPENDENT_AMBULATORY_CARE_PROVIDER_SITE_OTHER): Payer: Medicare Other | Admitting: Physician Assistant

## 2017-09-22 ENCOUNTER — Encounter: Payer: Self-pay | Admitting: Physician Assistant

## 2017-09-22 ENCOUNTER — Other Ambulatory Visit: Payer: Self-pay

## 2017-09-22 VITALS — BP 130/88 | HR 82 | Temp 98.3°F | Resp 16 | Ht 64.0 in | Wt 193.0 lb

## 2017-09-22 DIAGNOSIS — B9689 Other specified bacterial agents as the cause of diseases classified elsewhere: Secondary | ICD-10-CM

## 2017-09-22 DIAGNOSIS — J988 Other specified respiratory disorders: Secondary | ICD-10-CM | POA: Diagnosis not present

## 2017-09-22 MED ORDER — CEFDINIR 300 MG PO CAPS: 300.0000 mg | ORAL_CAPSULE | Freq: Two times a day (BID) | ORAL | 0 refills | Status: DC

## 2017-09-22 NOTE — Progress Notes (Signed)
Patient ID: Janet Peterson MRN: [id number], DOB: 14-Jun-1950, 67 y.o. Date of Encounter: 09/22/2017, 2:42 PM    Chief Complaint:  Chief Complaint  Patient presents with  . Nasal Congestion    symptoms for 1 week   . nasal drainage     HPI: 67 y.o. year old female presents with above.   She reports that she has been having congestion in her head and nose for over a week now.  States that it has been thick dark mucus.  Says it it has been going down her throat and into her chest the last couple days.  Points to upper chest and says that she has felt it rattling there for the last couple days.  Has had no significant sore throat.  Has had no fevers or chills.  No other concerns to address.     Home Meds:   Outpatient Medications Prior to Visit  Medication Sig Dispense Refill  . baclofen (LIORESAL) 10 MG tablet Take 10 mg by mouth 2 (two) times daily. Reported on 12/09/2015    . Brexpiprazole (REXULTI) 2 MG TABS Take 2 mg by mouth daily.    Marland Kitchen buPROPion (WELLBUTRIN XL) 300 MG 24 hr tablet Take 300 mg by mouth every morning.  1  . clomiPRAMINE (ANAFRANIL) 75 MG capsule Take 75 mg by mouth 2 (two) times daily.  2  . clonazePAM (KLONOPIN) 1 MG tablet TAKE 1 TABLET BY MOUTH EVERY DAY AS NEEDED FOR ANXIETY  1  . ezetimibe (ZETIA) 10 MG tablet Take 1 tablet (10 mg total) by mouth daily. 90 tablet 3  . hydrochlorothiazide (HYDRODIURIL) 25 MG tablet Take 1 tablet (25 mg total) by mouth daily. 90 tablet 3  . leflunomide (ARAVA) 20 MG tablet TAKE 1 TABLET BY MOUTH EVERY DAY 90 tablet 0  . metoprolol succinate (TOPROL-XL) 50 MG 24 hr tablet TAKE 1 TABLET BY MOUTH EVERY DAY WITH OR IMMEDIATELY FOLLOWING A MEAL 90 tablet 3  . omeprazole (PRILOSEC) 40 MG capsule Take 1 capsule (40 mg total) by mouth daily. 90 capsule 3  . oxyCODONE-acetaminophen (PERCOCET) 5-325 MG tablet Take 1 tablet by mouth every 4 (four) hours as needed. 60 tablet 0  . pravastatin (PRAVACHOL) 20 MG tablet TAKE 1 TABLET BY MOUTH  DAILY (Patient taking differently: TAKE 1 TABLET BY MOUTH DAILY--- TAKES IN PM) 90 tablet 3  . sucralfate (CARAFATE) 1 G tablet Take 1 tablet (1 g total) by mouth 4 (four) times daily -  with meals and at bedtime. 120 tablet 3  . SUMAtriptan (IMITREX) 50 MG tablet TAKE 1 TABLET BY MOUTH ONCE DAILY AS NEEDED FOR HEADACHES 30 tablet 0  . traZODone (DESYREL) 100 MG tablet Take 100 mg by mouth at bedtime.     . valsartan (DIOVAN) 320 MG tablet Take 1 tablet (320 mg total) by mouth daily. 90 tablet 3  . Vitamin D, Ergocalciferol, (DRISDOL) 50000 units CAPS capsule Take 1 capsule (50,000 Units total) by mouth every 30 (thirty) days. 6 capsule 0  . furosemide (LASIX) 40 MG tablet TAKE 1 TABLET (40 MG TOTAL) BY MOUTH DAILY. (Patient not taking: Reported on 09/22/2017) 30 tablet 9  . KLOR-CON M20 20 MEQ tablet TAKE 1 TABLET BY MOUTH EVERY DAY 90 tablet 3   No facility-administered medications prior to visit.     Allergies:  Allergies  Allergen Reactions  . Latex Rash    And Mouth sores  . Amlodipine Besy-Benazepril Hcl Other (See Comments)    COUGH  .  Augmentin [Amoxicillin-Pot Clavulanate] Diarrhea and Nausea And Vomiting  . Bextra [Valdecoxib] Diarrhea and Nausea And Vomiting    REFLUX  . Lipitor [Atorvastatin Calcium] Other (See Comments)    MYALGIAS  . Sulfa Antibiotics Nausea And Vomiting    CRAMPING  . Zocor [Simvastatin] Other (See Comments)    MYALGIA      Review of Systems: See HPI for pertinent ROS. All other ROS negative.    Physical Exam: Blood pressure 130/88, pulse 82, temperature 98.3 F (36.8 C), temperature source Oral, resp. rate 16, height 5\' 4"  (1.626 m), weight 87.5 kg (193 lb), SpO2 96 %., Body mass index is 33.13 kg/m. General:  WF. Appears in no acute distress. HEENT: Normocephalic, atraumatic, eyes without discharge, sclera non-icteric, nares are without discharge. Bilateral auditory canals clear, TM's are without perforation, pearly grey and translucent with  reflective cone of light bilaterally. Oral cavity moist, posterior pharynx without exudate, erythema, peritonsillar abscess.  No tenderness with percussion to frontal or maxillary sinuses bilaterally.  Neck: Supple. No thyromegaly. No lymphadenopathy. Lungs: Clear bilaterally to auscultation without wheezes, rales, or rhonchi. Breathing is unlabored. Heart: Regular rhythm. No murmurs, rubs, or gallops. Msk:  Strength and tone normal for age. Extremities/Skin: Warm and dry.  Neuro: Alert and oriented X 3. Moves all extremities spontaneously. Gait is normal. CNII-XII grossly in tact. Psych:  Responds to questions appropriately with a normal affect.     ASSESSMENT AND PLAN:  67 y.o. year old female with  1. Bacterial respiratory infection Allergy list includes Augmentin but reviewed that at visit 02/2017 she was prescribed Omnicef and that worked well and caused no adverse effect. She is to take the [facility] as directed.  Follow-up if symptoms do not resolve upon completion of antibiotic. - cefdinir (OMNICEF) 300 MG capsule; Take 1 capsule (300 mg total) by mouth 2 (two) times daily.  Dispense: 20 capsule; Refill: 0   Signed, [address] [place], [place], [facility] 09/22/2017 2:42 PM

## 2017-09-23 ENCOUNTER — Ambulatory Visit (INDEPENDENT_AMBULATORY_CARE_PROVIDER_SITE_OTHER): Payer: Medicare Other

## 2017-09-23 ENCOUNTER — Encounter: Payer: Self-pay | Admitting: Rheumatology

## 2017-09-23 ENCOUNTER — Ambulatory Visit (INDEPENDENT_AMBULATORY_CARE_PROVIDER_SITE_OTHER): Payer: Self-pay

## 2017-09-23 ENCOUNTER — Ambulatory Visit (INDEPENDENT_AMBULATORY_CARE_PROVIDER_SITE_OTHER): Payer: Medicare Other | Admitting: Rheumatology

## 2017-09-23 VITALS — BP 113/81 | HR 86 | Resp 15 | Ht 64.0 in | Wt 194.0 lb

## 2017-09-23 DIAGNOSIS — Z8659 Personal history of other mental and behavioral disorders: Secondary | ICD-10-CM

## 2017-09-23 DIAGNOSIS — Z8669 Personal history of other diseases of the nervous system and sense organs: Secondary | ICD-10-CM

## 2017-09-23 DIAGNOSIS — M47816 Spondylosis without myelopathy or radiculopathy, lumbar region: Secondary | ICD-10-CM | POA: Diagnosis not present

## 2017-09-23 DIAGNOSIS — M5030 Other cervical disc degeneration, unspecified cervical region: Secondary | ICD-10-CM | POA: Diagnosis not present

## 2017-09-23 DIAGNOSIS — L4050 Arthropathic psoriasis, unspecified: Secondary | ICD-10-CM

## 2017-09-23 DIAGNOSIS — M5134 Other intervertebral disc degeneration, thoracic region: Secondary | ICD-10-CM

## 2017-09-23 DIAGNOSIS — Z79899 Other long term (current) drug therapy: Secondary | ICD-10-CM

## 2017-09-23 DIAGNOSIS — Z96652 Presence of left artificial knee joint: Secondary | ICD-10-CM | POA: Diagnosis not present

## 2017-09-23 DIAGNOSIS — M797 Fibromyalgia: Secondary | ICD-10-CM

## 2017-09-23 DIAGNOSIS — R5383 Other fatigue: Secondary | ICD-10-CM

## 2017-09-23 DIAGNOSIS — L409 Psoriasis, unspecified: Secondary | ICD-10-CM | POA: Diagnosis not present

## 2017-09-23 DIAGNOSIS — Z8679 Personal history of other diseases of the circulatory system: Secondary | ICD-10-CM

## 2017-09-23 DIAGNOSIS — Z8639 Personal history of other endocrine, nutritional and metabolic disease: Secondary | ICD-10-CM

## 2017-09-23 DIAGNOSIS — G2581 Restless legs syndrome: Secondary | ICD-10-CM

## 2017-09-23 DIAGNOSIS — Z8719 Personal history of other diseases of the digestive system: Secondary | ICD-10-CM

## 2017-09-23 MED ORDER — VITAMIN D (ERGOCALCIFEROL) 1.25 MG (50000 UNIT) PO CAPS: 50000.0000 [IU] | ORAL_CAPSULE | ORAL | 0 refills | Status: DC

## 2017-09-23 NOTE — Patient Instructions (Signed)
Standing Labs We placed an order today for your standing lab work.    Please come back and get your standing labs in June and every 3 months  We have open lab Monday through Friday from 8:30-11:30 AM and 1:30-4:00 PM  at the office of Dr. Shaili Deveshwar.   You may experience shorter wait times on Monday and Friday afternoons. The office is located at [address], [address], [place], [place] [address] No appointment is necessary.   Labs are drawn by [facility].  You may receive a bill from [facility] for your lab work. If you have any questions regarding directions or hours of operation,  please call [phone].    

## 2017-10-11 ENCOUNTER — Other Ambulatory Visit: Payer: Self-pay | Admitting: *Deleted

## 2017-10-11 DIAGNOSIS — L4050 Arthropathic psoriasis, unspecified: Secondary | ICD-10-CM

## 2017-10-14 ENCOUNTER — Telehealth: Payer: Self-pay | Admitting: Physician Assistant

## 2017-10-14 ENCOUNTER — Ambulatory Visit (HOSPITAL_COMMUNITY)
Admission: RE | Admit: 2017-10-14 | Discharge: 2017-10-14 | Disposition: A | Discharge status: Home / Self Care | Payer: Medicare Other | Source: Ambulatory Visit | Attending: Rheumatology | Admitting: Rheumatology

## 2017-10-14 DIAGNOSIS — L4050 Arthropathic psoriasis, unspecified: Secondary | ICD-10-CM | POA: Diagnosis present

## 2017-10-14 LAB — COMPREHENSIVE METABOLIC PANEL
ALT: 15 U/L (ref 14–54)
AST: 26 U/L (ref 15–41)
Albumin: 3.9 g/dL (ref 3.5–5.0)
Alkaline Phosphatase: 98 U/L (ref 38–126)
Anion gap: 9 (ref 5–15)
BUN: 5 mg/dL — ABNORMAL LOW (ref 6–20)
CO2: 28 mmol/L (ref 22–32)
Calcium: 9.7 mg/dL (ref 8.9–10.3)
Chloride: 94 mmol/L — ABNORMAL LOW (ref 101–111)
Creatinine, Ser: 0.76 mg/dL (ref 0.44–1.00)
GFR calc Af Amer: >60 mL/min (ref >60)
GFR calc non Af Amer: >60 mL/min (ref >60)
Glucose, Bld: 105 mg/dL — ABNORMAL HIGH (ref 65–99)
Potassium: 3.7 mmol/L (ref 3.5–5.1)
Sodium: 131 mmol/L — ABNORMAL LOW (ref 135–145)
Total Bilirubin: 0.4 mg/dL (ref 0.3–1.2)
Total Protein: 6.6 g/dL (ref 6.5–8.1)

## 2017-10-14 LAB — CBC
HCT: 36.4 % (ref 36.0–46.0)
Hemoglobin: 12.3 g/dL (ref 12.0–15.0)
MCH: 31.5 pg (ref 26.0–34.0)
MCHC: 33.8 g/dL (ref 30.0–36.0)
MCV: 93.1 fL (ref 78.0–100.0)
Platelets: 150 10*3/uL (ref 150–400)
RBC: 3.91 MIL/uL (ref 3.87–5.11)
RDW: 13.4 % (ref 11.5–15.5)
WBC: 3.2 10*3/uL — ABNORMAL LOW (ref 4.0–10.5)

## 2017-10-14 MED ORDER — SODIUM CHLORIDE 0.9 % IV SOLN
2.0000 mg/kg | INTRAVENOUS | Status: DC
  Administered 2017-10-14: 176.25 mg via INTRAVENOUS
  Filled 2017-10-14: qty 14.1

## 2017-10-14 MED ORDER — DIPHENHYDRAMINE HCL 25 MG PO CAPS: 25.0000 mg | ORAL_CAPSULE | ORAL | Status: DC

## 2017-10-14 MED ORDER — ACETAMINOPHEN 325 MG PO TABS: 650.0000 mg | ORAL_TABLET | ORAL | Status: DC

## 2017-10-14 NOTE — Progress Notes (Signed)
Please advise patient to decrease Arava to half a tablet p.o. daily.  Her WBC count has decreased.  We will recheck her WBC count in 1 month.

## 2017-10-14 NOTE — Telephone Encounter (Signed)
I called patient to discuss lab work.  Her WBC is trending down.  Today WBC was 3.2. I advised her to only take Arava 1/2 tablet (10 mg) by mouth daily.  We will recheck CBC in 1 month.  She understands the plan, and she had no further questions or concerns.

## 2017-10-15 ENCOUNTER — Ambulatory Visit (INDEPENDENT_AMBULATORY_CARE_PROVIDER_SITE_OTHER): Payer: Medicare Other | Admitting: Family Medicine

## 2017-10-15 ENCOUNTER — Encounter: Payer: Self-pay | Admitting: Family Medicine

## 2017-10-15 VITALS — BP 130/84 | HR 91 | Temp 98.4°F | Resp 14 | Ht 64.0 in | Wt 189.0 lb

## 2017-10-15 DIAGNOSIS — B029 Zoster without complications: Secondary | ICD-10-CM | POA: Diagnosis not present

## 2017-10-15 MED ORDER — VALACYCLOVIR HCL 1 G PO TABS: 1000.0000 mg | ORAL_TABLET | Freq: Three times a day (TID) | ORAL | 0 refills | Status: DC

## 2017-10-15 MED ORDER — OXYCODONE-ACETAMINOPHEN 10-325 MG PO TABS: 1.0000 | ORAL_TABLET | Freq: Three times a day (TID) | ORAL | 0 refills | Status: DC | PRN

## 2017-10-15 MED ORDER — RIZATRIPTAN BENZOATE 10 MG PO TABS: 10.0000 mg | ORAL_TABLET | ORAL | 0 refills | Status: DC | PRN

## 2017-10-15 NOTE — Progress Notes (Signed)
Subjective:    Patient ID: Janet Peterson, female    DOB: 04-Apr-1951, 67 y.o.   MRN: [id number]  HPI Patient reports a one-week history of a painful burning rash underneath her left breast.  On physical examination, there are linear clusters of yellow vesicles on erythematous bases directly below her left breast radiating around onto her left rib cage, streaking around to her thoracic spine.  They are in a dermatomal pattern and have a consistent appearance of shingles.  She also states that she gets migraines 1-2 times a month.  Recently Imitrex has not been working for her.  She is interested in trying another alternative Past Medical History:  Diagnosis Date  . Ankylosing spondylitis ([place])   . Anxiety and depression   . Depression   . Fibromyalgia   . GERD (gastroesophageal reflux disease)   . History of basal cell carcinoma excision    FACE, [place]  . History of hiatal hernia   . History of thrush   . Hyperlipidemia   . Hypertension   . Insomnia   . OCD (obsessive compulsive disorder)   . OSA (obstructive sleep apnea)    MILD AND NO CPAP SINCE SURGERY IN 2007  . Osteopenia   . PONV (postoperative nausea and vomiting)   . Psoriatic arthritis ([place])   . PVC (premature ventricular contraction)   . Rheumatoid arthritis [facility])    Past Surgical History:  Procedure Laterality Date  . BREAST SURGERY  1975   lumpectomy-- benign  . BUNIONECTOMY  1991  . CATARACT EXTRACTION W/ INTRAOCULAR LENS  IMPLANT, BILATERAL  2000  . CERVICAL FUSION  March 2013   C5 -- C7  . CESAREAN SECTION  1985  . DISTAL INTERPHALANGEAL JOINT FUSION Right 03/14/2015   Procedure: RIGHT LONG FINGER DISTAL INTERPHALANGEAL JOINT ARTHRODESIS;  Surgeon: Iran Planas, MD;  Location: [place];  Service: Orthopedics;  Laterality: Right;  . EYE SURGERY  1995   rk (laser surgery), semi cornea transplant, detacted retina,  fluid removal  . KNEE ARTHROSCOPY Left 03-03-2004  . POSTERIOR VITRECTOMY  RIGHT EYE AND LASER   10-27-1999  . SHOULDER SURGERY Right 1997  . SPINAL FIXATION SURGERY W/ IMPLANT  2013 rod #1//  2014  rod 2   S1 -- T10  (rod #1)//   S1 -- T4 (rod #2)  . THORACIC FUSION  03-13-2013   REMOVAL HARDWARE/  BONE GRAFT FUSION T10//  REVISION OF RODS  . TOTAL KNEE ARTHROPLASTY Left 12-14-2005  . UVULOPALATOPHARYNGOPLASTY  04-26-2006   w/  TONSILLECTOMY/  TURBINATE REDUCTIONS/  BILATERAL ANTERIOR ETHMOIDECTOMY   Current Outpatient Medications on File Prior to Visit  Medication Sig Dispense Refill  . baclofen (LIORESAL) 10 MG tablet Take 10 mg by mouth 2 (two) times daily. Reported on 12/09/2015    . buPROPion (WELLBUTRIN XL) 300 MG 24 hr tablet Take 300 mg by mouth every morning.  1  . clomiPRAMINE (ANAFRANIL) 75 MG capsule Take 75 mg by mouth 2 (two) times daily.  2  . clonazePAM (KLONOPIN) 1 MG tablet TAKE 1 TABLET BY MOUTH EVERY DAY AS NEEDED FOR ANXIETY  1  . ezetimibe (ZETIA) 10 MG tablet Take 1 tablet (10 mg total) by mouth daily. 90 tablet 3  . Golimumab (SIMPONI ARIA IV) Inject into the vein every 8 (eight) weeks.    . hydrochlorothiazide (HYDRODIURIL) 25 MG tablet Take 1 tablet (25 mg total) by mouth daily. 90 tablet 3  . leflunomide (ARAVA)  20 MG tablet TAKE 1 TABLET BY MOUTH EVERY DAY 90 tablet 0  . metoprolol succinate (TOPROL-XL) 50 MG 24 hr tablet TAKE 1 TABLET BY MOUTH EVERY DAY WITH OR IMMEDIATELY FOLLOWING A MEAL 90 tablet 3  . omeprazole (PRILOSEC) 40 MG capsule Take 1 capsule (40 mg total) by mouth daily. 90 capsule 3  . oxyCODONE-acetaminophen (PERCOCET) 5-325 MG tablet Take 1 tablet by mouth every 4 (four) hours as needed. 60 tablet 0  . pravastatin (PRAVACHOL) 20 MG tablet TAKE 1 TABLET BY MOUTH DAILY (Patient taking differently: TAKE 1 TABLET BY MOUTH DAILY--- TAKES IN PM) 90 tablet 3  . sucralfate (CARAFATE) 1 G tablet Take 1 tablet (1 g total) by mouth 4 (four) times daily -  with meals and at bedtime. 120 tablet 3  . SUMAtriptan (IMITREX) 50 MG tablet  TAKE 1 TABLET BY MOUTH ONCE DAILY AS NEEDED FOR HEADACHES 30 tablet 0  . traZODone (DESYREL) 100 MG tablet Take 100 mg by mouth at bedtime.     . valsartan (DIOVAN) 320 MG tablet Take 1 tablet (320 mg total) by mouth daily. 90 tablet 3  . Vitamin D, Ergocalciferol, (DRISDOL) 50000 units CAPS capsule Take 1 capsule (50,000 Units total) by mouth every 30 (thirty) days. 6 capsule 0   No current facility-administered medications on file prior to visit.    Allergies  Allergen Reactions  . Latex Rash    And Mouth sores  . Amlodipine Besy-Benazepril Hcl Other (See Comments)    COUGH  . Augmentin [Amoxicillin-Pot Clavulanate] Diarrhea and Nausea And Vomiting  . Bextra [Valdecoxib] Diarrhea and Nausea And Vomiting    REFLUX  . Lipitor [Atorvastatin Calcium] Other (See Comments)    MYALGIAS  . Sulfa Antibiotics Nausea And Vomiting    CRAMPING  . Zocor [Simvastatin] Other (See Comments)    MYALGIA   Social History   Socioeconomic History  . Marital status: Married    Spouse name: Not on file  . Number of children: Not on file  . Years of education: Not on file  . Highest education level: Not on file  Occupational History  . Not on file  Social Needs  . Financial resource strain: Not on file  . Food insecurity:    Worry: Not on file    Inability: Not on file  . Transportation needs:    Medical: Not on file    Non-medical: Not on file  Tobacco Use  . Smoking status: Former Smoker    Packs/day: 0.50    Years: 10.00    Pack years: 5.00    Types: Cigarettes    Last attempt to quit: 03/07/1995    Years since quitting: 22.6  . Smokeless tobacco: Never Used  Substance and Sexual Activity  . Alcohol use: Yes    Comment: rare  . Drug use: Never  . Sexual activity: Not on file  Lifestyle  . Physical activity:    Days per week: Not on file    Minutes per session: Not on file  . Stress: Not on file  Relationships  . Social connections:    Talks on phone: Not on file    Gets  together: Not on file    Attends religious service: Not on file    Active member of club or organization: Not on file    Attends meetings of clubs or organizations: Not on file    Relationship status: Not on file  . Intimate partner violence:    Fear of current  or ex partner: Not on file    Emotionally abused: Not on file    Physically abused: Not on file    Forced sexual activity: Not on file  Other Topics Concern  . Not on file  Social History Narrative  . Not on file      Review of Systems  All other systems reviewed and are negative.      Objective:   Physical Exam  Cardiovascular: Normal rate, regular rhythm and normal heart sounds.  Pulmonary/Chest: Breath sounds normal. No stridor. No respiratory distress. She has no wheezes.        Skin: Rash noted. There is erythema.  Vitals reviewed.         Assessment & Plan:  Herpes zoster without complication  Start Valtrex 1 g p.o. 3 times daily for 7 days.  Temporarily increase Percocet to 10/325 1 p.o. every 6 hours as needed pain.  When shingles is better go back to her previous standard dose.  Discontinue Imitrex and replaced with Maxalt 10 mg p.o. at the first sign of migraine.  She could repeat a pill in 2 hours if necessary

## 2017-10-18 LAB — QUANTIFERON-TB GOLD PLUS (RQFGPL)
QuantiFERON Mitogen Value: >10 IU/mL
QuantiFERON Nil Value: 0.11 IU/mL
QuantiFERON TB1 Ag Value: 0.13 IU/mL
QuantiFERON TB2 Ag Value: 0.12 IU/mL

## 2017-10-18 LAB — QUANTIFERON-TB GOLD PLUS: QuantiFERON-TB Gold Plus: NEGATIVE

## 2017-10-19 ENCOUNTER — Telehealth: Payer: Self-pay | Admitting: Rheumatology

## 2017-10-19 NOTE — Telephone Encounter (Signed)
She may have infusion after all shingles lesions have dried up.

## 2017-10-19 NOTE — Telephone Encounter (Signed)
Patient called stating she was returning Dr. Deveshwar's call.   

## 2017-10-19 NOTE — Telephone Encounter (Signed)
Patient has been advised of information below.

## 2017-10-19 NOTE — Telephone Encounter (Signed)
Advised patient of lab results and recommendations. Patient verbalized understanding.   Patient states she had her infusion on 10/14/2017 and was diagnosed with shingles on 10/15/2017. Patient would like to know what the next step would be since she did have the infusion.

## 2017-10-22 NOTE — Progress Notes (Signed)
NOTIFIED PT OF LAB RESULTS

## 2017-10-27 ENCOUNTER — Other Ambulatory Visit: Payer: Self-pay | Admitting: Rheumatology

## 2017-10-27 NOTE — Telephone Encounter (Signed)
Last Visit: 09/23/17 Next Visit: 02/24/18 Labs: 10/14/17 Her WBC count has decreased  Okay to refill per Dr. Estanislado Pandy

## 2017-11-11 ENCOUNTER — Other Ambulatory Visit: Payer: Self-pay | Admitting: Family Medicine

## 2017-11-11 MED ORDER — OXYCODONE-ACETAMINOPHEN 5-325 MG PO TABS: 1.0000 | ORAL_TABLET | ORAL | 0 refills | Status: DC | PRN

## 2017-11-11 NOTE — Telephone Encounter (Signed)
Patient requesting a refill on Oxycodone     LOV: 10/15/17  LRF: 10/15/17

## 2017-11-30 ENCOUNTER — Other Ambulatory Visit: Payer: Self-pay | Admitting: Family Medicine

## 2017-12-06 ENCOUNTER — Other Ambulatory Visit: Payer: Self-pay | Admitting: *Deleted

## 2017-12-06 NOTE — Progress Notes (Signed)
Infusion orders are current for patient CBC CMP Tylenol Benadryl appointments are up to date and follow up appointment  is scheduled TB gold not due yet.  

## 2017-12-09 ENCOUNTER — Ambulatory Visit (HOSPITAL_COMMUNITY)
Admission: RE | Admit: 2017-12-09 | Discharge: 2017-12-09 | Disposition: A | Discharge status: Home / Self Care | Payer: Medicare Other | Source: Ambulatory Visit | Attending: Rheumatology | Admitting: Rheumatology

## 2017-12-09 DIAGNOSIS — L4050 Arthropathic psoriasis, unspecified: Secondary | ICD-10-CM | POA: Diagnosis not present

## 2017-12-09 LAB — COMPREHENSIVE METABOLIC PANEL
ALT: 12 U/L (ref 0–44)
AST: 17 U/L (ref 15–41)
Albumin: 3.7 g/dL (ref 3.5–5.0)
Alkaline Phosphatase: 73 U/L (ref 38–126)
Anion gap: 9 (ref 5–15)
BUN: 7 mg/dL — ABNORMAL LOW (ref 8–23)
CO2: 28 mmol/L (ref 22–32)
Calcium: 9.3 mg/dL (ref 8.9–10.3)
Chloride: 94 mmol/L — ABNORMAL LOW (ref 98–111)
Creatinine, Ser: 0.67 mg/dL (ref 0.44–1.00)
GFR calc Af Amer: >60 mL/min (ref >60)
GFR calc non Af Amer: >60 mL/min (ref >60)
Glucose, Bld: 117 mg/dL — ABNORMAL HIGH (ref 70–99)
Potassium: 4.1 mmol/L (ref 3.5–5.1)
Sodium: 131 mmol/L — ABNORMAL LOW (ref 135–145)
Total Bilirubin: 0.3 mg/dL (ref 0.3–1.2)
Total Protein: 6.4 g/dL — ABNORMAL LOW (ref 6.5–8.1)

## 2017-12-09 LAB — CBC
HCT: 36.7 % (ref 36.0–46.0)
Hemoglobin: 12.2 g/dL (ref 12.0–15.0)
MCH: 31.6 pg (ref 26.0–34.0)
MCHC: 33.2 g/dL (ref 30.0–36.0)
MCV: 95.1 fL (ref 78.0–100.0)
Platelets: 207 10*3/uL (ref 150–400)
RBC: 3.86 MIL/uL — ABNORMAL LOW (ref 3.87–5.11)
RDW: 13.7 % (ref 11.5–15.5)
WBC: 3.8 10*3/uL — ABNORMAL LOW (ref 4.0–10.5)

## 2017-12-09 MED ORDER — DIPHENHYDRAMINE HCL 25 MG PO CAPS: 25.0000 mg | ORAL_CAPSULE | ORAL | Status: DC

## 2017-12-09 MED ORDER — ACETAMINOPHEN 325 MG PO TABS: 650.0000 mg | ORAL_TABLET | ORAL | Status: DC

## 2017-12-09 MED ORDER — SODIUM CHLORIDE 0.9 % IV SOLN
2.0000 mg/kg | INTRAVENOUS | Status: DC
  Administered 2017-12-09: 176.25 mg via INTRAVENOUS
  Filled 2017-12-09: qty 14.1

## 2017-12-09 NOTE — Progress Notes (Signed)
Labs are stable.

## 2018-01-04 ENCOUNTER — Other Ambulatory Visit: Payer: Self-pay | Admitting: Family Medicine

## 2018-01-04 NOTE — Telephone Encounter (Signed)
Patient requesting a refill on Oxycodone     LOV:  10/15/17  LRF:   11/11/17

## 2018-01-06 MED ORDER — OXYCODONE-ACETAMINOPHEN 5-325 MG PO TABS: 1.0000 | ORAL_TABLET | ORAL | 0 refills | Status: DC | PRN

## 2018-01-18 ENCOUNTER — Other Ambulatory Visit: Payer: Self-pay | Admitting: *Deleted

## 2018-01-18 NOTE — Progress Notes (Signed)
Infusion orders are current for patient CBC CMP Tylenol Benadryl appointments are up to date and follow up appointment  is scheduled TB gold not due yet.  

## 2018-01-21 ENCOUNTER — Other Ambulatory Visit: Payer: Self-pay | Admitting: Rheumatology

## 2018-01-21 NOTE — Telephone Encounter (Signed)
Last Visit: 09/23/17 Next Visit: 02/24/18 Labs: 12/09/17 stable  Okay to refill per Dr. Estanislado Pandy

## 2018-02-03 ENCOUNTER — Ambulatory Visit (HOSPITAL_COMMUNITY)
Admission: RE | Admit: 2018-02-03 | Discharge: 2018-02-03 | Disposition: A | Discharge status: Home / Self Care | Payer: Medicare Other | Source: Ambulatory Visit | Attending: Rheumatology | Admitting: Rheumatology

## 2018-02-03 ENCOUNTER — Telehealth: Payer: Self-pay | Admitting: Rheumatology

## 2018-02-03 DIAGNOSIS — L4050 Arthropathic psoriasis, unspecified: Secondary | ICD-10-CM | POA: Diagnosis not present

## 2018-02-03 LAB — CBC
HCT: 35.9 % — ABNORMAL LOW (ref 36.0–46.0)
Hemoglobin: 11.7 g/dL — ABNORMAL LOW (ref 12.0–15.0)
MCH: 31.2 pg (ref 26.0–34.0)
MCHC: 32.6 g/dL (ref 30.0–36.0)
MCV: 95.7 fL (ref 78.0–100.0)
Platelets: 183 10*3/uL (ref 150–400)
RBC: 3.75 MIL/uL — ABNORMAL LOW (ref 3.87–5.11)
RDW: 13.2 % (ref 11.5–15.5)
WBC: 3.7 10*3/uL — ABNORMAL LOW (ref 4.0–10.5)

## 2018-02-03 LAB — COMPREHENSIVE METABOLIC PANEL
ALT: 11 U/L (ref 0–44)
AST: 19 U/L (ref 15–41)
Albumin: 3.5 g/dL (ref 3.5–5.0)
Alkaline Phosphatase: 69 U/L (ref 38–126)
Anion gap: 10 (ref 5–15)
BUN: 5 mg/dL — ABNORMAL LOW (ref 8–23)
CO2: 25 mmol/L (ref 22–32)
Calcium: 9.4 mg/dL (ref 8.9–10.3)
Chloride: 94 mmol/L — ABNORMAL LOW (ref 98–111)
Creatinine, Ser: 0.74 mg/dL (ref 0.44–1.00)
GFR calc Af Amer: >60 mL/min (ref >60)
GFR calc non Af Amer: >60 mL/min (ref >60)
Glucose, Bld: 135 mg/dL — ABNORMAL HIGH (ref 70–99)
Potassium: 3.5 mmol/L (ref 3.5–5.1)
Sodium: 129 mmol/L — ABNORMAL LOW (ref 135–145)
Total Bilirubin: 0.4 mg/dL (ref 0.3–1.2)
Total Protein: 5.9 g/dL — ABNORMAL LOW (ref 6.5–8.1)

## 2018-02-03 MED ORDER — ACETAMINOPHEN 325 MG PO TABS: 650.0000 mg | ORAL_TABLET | ORAL | Status: DC

## 2018-02-03 MED ORDER — DIPHENHYDRAMINE HCL 25 MG PO CAPS: 25.0000 mg | ORAL_CAPSULE | ORAL | Status: DC

## 2018-02-03 MED ORDER — SODIUM CHLORIDE 0.9 % IV SOLN
2.0000 mg/kg | INTRAVENOUS | Status: AC
  Administered 2018-02-03: 181.25 mg via INTRAVENOUS
  Filled 2018-02-03: qty 14.5

## 2018-02-03 NOTE — Telephone Encounter (Signed)
Patient called stating she received her lab results and requested a return call regarding her Anemia.

## 2018-02-03 NOTE — Progress Notes (Signed)
Mild anemia

## 2018-02-03 NOTE — Telephone Encounter (Signed)
Attempted to contact the patient and left message for patient to call the office.  

## 2018-02-11 NOTE — Progress Notes (Signed)
Office Visit Note  Patient: Janet Peterson             Date of Birth: [birth year]           MRN: [id number]             PCP: Susy Frizzle, MD Referring: Susy Frizzle, MD Visit Date: 02/24/2018 Occupation: @GUAROCC @  Subjective:  Stiffness in knees and legs.   History of Present Illness: Janet Peterson is a 67 y.o. female history of psoriatic arthritis, psoriasis, osteoarthritis, DDD and fibromyalgia.  She states she has been experiencing increasing stiffness in her knee joints in her lower extremities.  She has additional discomfort in her hands which she describes over the DIP joints.  She has a stiffness in her replaced knee as well.  She continues to have some discomfort due to underlying disc disease in her cervical, thoracic and lumbar region.  She continues to have discomfort from fibromyalgia.  She complains of hyperalgesia.  She had had shingles in the spring.  Activities of Daily Living:  Patient reports morning stiffness for 1 hour.   Patient Reports nocturnal pain.  Difficulty dressing/grooming: Denies Difficulty climbing stairs: Reports Difficulty getting out of chair: Reports Difficulty using hands for taps, buttons, cutlery, and/or writing: Denies  Review of Systems  Constitutional: Positive for fatigue. Negative for night sweats, weight gain and weight loss.  HENT: Negative for mouth sores, trouble swallowing, trouble swallowing, mouth dryness and nose dryness.   Eyes: Positive for dryness. Negative for pain, redness and visual disturbance.  Respiratory: Negative for cough, shortness of breath and difficulty breathing.   Cardiovascular: Negative for chest pain, palpitations, hypertension, irregular heartbeat and swelling in legs/feet.  Gastrointestinal: Negative for blood in stool, constipation and diarrhea.  Endocrine: Negative for increased urination.  Genitourinary: Negative for vaginal dryness.  Musculoskeletal: Positive for myalgias, morning stiffness and  myalgias. Negative for arthralgias, joint pain, joint swelling, muscle weakness and muscle tenderness.  Skin: Positive for rash. Negative for color change, hair loss, skin tightness, ulcers and sensitivity to sunlight.  Allergic/Immunologic: Negative for susceptible to infections.  Neurological: Negative for dizziness, memory loss, night sweats and weakness.  Hematological: Negative for swollen glands.  Psychiatric/Behavioral: Positive for depressed mood and sleep disturbance. The patient is not nervous/anxious.     PMFS History:  Patient Active Problem List   Diagnosis Date Noted  . DDD (degenerative disc disease), cervical s/p fusion 11/12/2016  . H/O total knee replacement, left 05/06/2016  . DDD lumbar spine status post fusion 05/06/2016  . Psoriasis 05/05/2016  . High risk medication use 05/05/2016  . Cervical post-laminectomy syndrome 12/09/2015  . Thoracic postlaminectomy syndrome 12/09/2015  . Psoriatic arthritis ([place]) 08/23/2012  . Osteopenia 08/23/2012  . HLD (hyperlipidemia) 08/23/2012  . RLS (restless legs syndrome) 08/23/2012  . Insomnia 08/23/2012  . Fibromyalgia syndrome 08/12/2012  . Low back pain 08/12/2012  . Syncope   . PVC (premature ventricular contraction)   . OCD (obsessive compulsive disorder)   . GERD (gastroesophageal reflux disease)   . Hypertension     Past Medical History:  Diagnosis Date  . Ankylosing spondylitis ([place])   . Anxiety and depression   . Depression   . Fibromyalgia   . GERD (gastroesophageal reflux disease)   . History of basal cell carcinoma excision    FACE, [place]  . History of hiatal hernia   . History of thrush   . Hyperlipidemia   . Hypertension   .  Insomnia   . OCD (obsessive compulsive disorder)   . OSA (obstructive sleep apnea)    MILD AND NO CPAP SINCE SURGERY IN 2007  . Osteopenia   . PONV (postoperative nausea and vomiting)   . Psoriatic arthritis ([place])   . PVC (premature ventricular contraction)   .  Rheumatoid arthritis ([place])     Family History  Problem Relation Age of Onset  . Diabetes Mother   . Heart disease Mother   . Diabetes Father   . Anuerysm Father   . Diabetes Brother   . Heart disease Brother   . Diabetes Brother   . Heart disease Brother    Past Surgical History:  Procedure Laterality Date  . BREAST SURGERY  1975   lumpectomy-- benign  . BUNIONECTOMY  1991  . CATARACT EXTRACTION W/ INTRAOCULAR LENS  IMPLANT, BILATERAL  2000  . CERVICAL FUSION  March 2013   C5 -- C7  . CESAREAN SECTION  1985  . DISTAL INTERPHALANGEAL JOINT FUSION Right 03/14/2015   Procedure: RIGHT LONG FINGER DISTAL INTERPHALANGEAL JOINT ARTHRODESIS;  Surgeon: Iran Planas, MD;  Location: [place];  Service: Orthopedics;  Laterality: Right;  . EYE SURGERY  1995   rk (laser surgery), semi cornea transplant, detacted retina,  fluid removal  . KNEE ARTHROSCOPY Left 03-03-2004  . POSTERIOR VITRECTOMY RIGHT EYE AND LASER   10-27-1999  . SHOULDER SURGERY Right 1997  . SPINAL FIXATION SURGERY W/ IMPLANT  2013 rod #1//  2014  rod 2   S1 -- T10  (rod #1)//   S1 -- T4 (rod #2)  . THORACIC FUSION  03-13-2013   REMOVAL HARDWARE/  BONE GRAFT FUSION T10//  REVISION OF RODS  . TOTAL KNEE ARTHROPLASTY Left 12-14-2005  . UVULOPALATOPHARYNGOPLASTY  04-26-2006   w/  TONSILLECTOMY/  TURBINATE REDUCTIONS/  BILATERAL ANTERIOR ETHMOIDECTOMY   Social History   Social History Narrative  . Not on file    Objective: Vital Signs: BP 140/90 (BP Location: Left Arm, Patient Position: Sitting, Cuff Size: Normal)   Pulse 78   Resp 14   Ht 5\' 4"  (1.626 m)   Wt 192 lb 3.2 oz (87.2 kg)   BMI 32.99 kg/m    Physical Exam  Constitutional: She is oriented to person, place, and time. She appears well-developed and well-nourished.  HENT:  Head: Normocephalic and atraumatic.  Eyes: Conjunctivae and EOM are normal.  Neck: Normal range of motion.  Cardiovascular: Normal rate, regular rhythm, normal  heart sounds and intact distal pulses.  Pulmonary/Chest: Effort normal and breath sounds normal.  Abdominal: Soft. Bowel sounds are normal.  Lymphadenopathy:    She has no cervical adenopathy.  Neurological: She is alert and oriented to person, place, and time.  Skin: Skin is warm and dry. Capillary refill takes less than 2 seconds.  Psychiatric: She has a normal mood and affect. Her behavior is normal.  Nursing note and vitals reviewed.    Musculoskeletal Exam: C-spine thoracic and lumbar spine limited range of motion.  Shoulder joints elbow joints wrist joints were in good range of motion.  She has some DIP thickening but no synovitis.  Left total knee replacement is doing well.  She had no warmth or swelling in her right knee joint but some crepitus with range of motion.  She has some generalized hyperalgesia and positive tender points.  CDAI Exam: CDAI Score: Not documented Patient Global Assessment: Not documented; Provider Global Assessment: Not documented Swollen: Not documented; Tender: Not documented Joint Exam  Not documented   There is currently no information documented on the homunculus. Go to the Rheumatology activity and complete the homunculus joint exam.  Investigation: No additional findings.  Imaging: No results found.  Recent Labs: Lab Results  Component Value Date   WBC 3.7 (L) 02/03/2018   HGB 11.7 (L) 02/03/2018   PLT 183 02/03/2018   NA 129 (L) 02/03/2018   K 3.5 02/03/2018   CL 94 (L) 02/03/2018   CO2 25 02/03/2018   GLUCOSE 135 (H) 02/03/2018   BUN 5 (L) 02/03/2018   CREATININE 0.74 02/03/2018   BILITOT 0.4 02/03/2018   ALKPHOS 69 02/03/2018   AST 19 02/03/2018   ALT 11 02/03/2018   PROT 5.9 (L) 02/03/2018   ALBUMIN 3.5 02/03/2018   CALCIUM 9.4 02/03/2018   GFRAA >60 02/03/2018   QFTBGOLD Indeterminate 08/14/2016   QFTBGOLDPLUS Negative 10/14/2017    Speciality Comments: Simponi Aria every 8 weeks started Jan 2018  TB gold negative  08/19/16  Procedures:  No procedures performed Allergies: Latex; Amlodipine besy-benazepril hcl; Augmentin [amoxicillin-pot clavulanate]; Bextra [valdecoxib]; Lipitor [atorvastatin calcium]; Sulfa antibiotics; and Zocor [simvastatin]   Assessment / Plan:     Visit Diagnoses: Psoriatic arthritis (HCC)-complaints of arthralgias.  She had no synovitis on examination.  She has some synovial thickening.  Psoriasis-no active lesions of psoriasis were noted.  High risk medication use - Simponi Aria IV, Arava 10 mg po qd.  She has stable neutropenia and thrombocytopenia.  We are monitoring her labs with her infusions.  H/O total knee replacement, left-she has chronic a stiffness.  DDD (degenerative disc disease), cervical s/p fusion-she has limited range of motion and discomfort.  DDD lumbar spine status post fusion-chronic pain  DDD (degenerative disc disease), thoracic-chronic pain  Fibromyalgia-she continues to have generalized pain discomfort and positive tender points.  Other fatigue-related to fibromyalgia and insomnia.  History of vitamin D deficiency-she is on vitamin D.  We will check her levels today.  RLS (restless legs syndrome)  History of hypertension-blood pressure is slightly elevated today have advised to monitor blood pressure and follow-up with her PCP.  History of hyperlipidemia  History of gastroesophageal reflux (GERD)  History of OCD (obsessive compulsive disorder)  History of migraine   Orders: Orders Placed This Encounter  Procedures  . VITAMIN D 25 Hydroxy (Vit-D Deficiency, Fractures)   No orders of the defined types were placed in this encounter.   Face-to-face time spent with patient was 30 minutes. Greater than 50% of time was spent in counseling and coordination of care.  Follow-Up Instructions: Return in about 5 months (around 07/26/2018) for Psoriatic arthritis, Osteoarthritis.   Bo Merino, MD  Note - This record has been created  using Editor, commissioning.  Chart creation errors have been sought, but may not always  have been located. Such creation errors do not reflect on  the standard of medical care.

## 2018-02-21 ENCOUNTER — Other Ambulatory Visit: Payer: Self-pay | Admitting: Family Medicine

## 2018-02-21 DIAGNOSIS — Z1231 Encounter for screening mammogram for malignant neoplasm of breast: Secondary | ICD-10-CM

## 2018-02-21 MED ORDER — OXYCODONE-ACETAMINOPHEN 5-325 MG PO TABS: 1.0000 | ORAL_TABLET | ORAL | 0 refills | Status: DC | PRN

## 2018-02-21 NOTE — Telephone Encounter (Signed)
Patient left VM asking for a refill on her oxycodone   CB# [phone]

## 2018-02-21 NOTE — Telephone Encounter (Signed)
Patient requesting a refill on Oxycodone     LOV:  10/15/17  LRF:  01/06/18

## 2018-02-24 ENCOUNTER — Encounter: Payer: Self-pay | Admitting: Rheumatology

## 2018-02-24 ENCOUNTER — Ambulatory Visit (INDEPENDENT_AMBULATORY_CARE_PROVIDER_SITE_OTHER): Payer: Medicare Other | Admitting: Rheumatology

## 2018-02-24 VITALS — BP 140/90 | HR 78 | Resp 14 | Ht 64.0 in | Wt 192.2 lb

## 2018-02-24 DIAGNOSIS — Z8659 Personal history of other mental and behavioral disorders: Secondary | ICD-10-CM

## 2018-02-24 DIAGNOSIS — Z8669 Personal history of other diseases of the nervous system and sense organs: Secondary | ICD-10-CM

## 2018-02-24 DIAGNOSIS — Z79899 Other long term (current) drug therapy: Secondary | ICD-10-CM | POA: Diagnosis not present

## 2018-02-24 DIAGNOSIS — L4050 Arthropathic psoriasis, unspecified: Secondary | ICD-10-CM | POA: Diagnosis not present

## 2018-02-24 DIAGNOSIS — M5030 Other cervical disc degeneration, unspecified cervical region: Secondary | ICD-10-CM

## 2018-02-24 DIAGNOSIS — Z8719 Personal history of other diseases of the digestive system: Secondary | ICD-10-CM

## 2018-02-24 DIAGNOSIS — L409 Psoriasis, unspecified: Secondary | ICD-10-CM | POA: Diagnosis not present

## 2018-02-24 DIAGNOSIS — Z96652 Presence of left artificial knee joint: Secondary | ICD-10-CM

## 2018-02-24 DIAGNOSIS — R5383 Other fatigue: Secondary | ICD-10-CM

## 2018-02-24 DIAGNOSIS — Z8639 Personal history of other endocrine, nutritional and metabolic disease: Secondary | ICD-10-CM

## 2018-02-24 DIAGNOSIS — M797 Fibromyalgia: Secondary | ICD-10-CM

## 2018-02-24 DIAGNOSIS — M47816 Spondylosis without myelopathy or radiculopathy, lumbar region: Secondary | ICD-10-CM

## 2018-02-24 DIAGNOSIS — Z8679 Personal history of other diseases of the circulatory system: Secondary | ICD-10-CM

## 2018-02-24 DIAGNOSIS — G2581 Restless legs syndrome: Secondary | ICD-10-CM

## 2018-02-24 DIAGNOSIS — M5134 Other intervertebral disc degeneration, thoracic region: Secondary | ICD-10-CM

## 2018-02-25 LAB — VITAMIN D 25 HYDROXY (VIT D DEFICIENCY, FRACTURES): Vit D, 25-Hydroxy: 32 ng/mL (ref 30–100)

## 2018-02-25 NOTE — Progress Notes (Signed)
Vit D is within normal limits. She should continue VitD 50,000 U q month.

## 2018-03-01 ENCOUNTER — Telehealth: Payer: Self-pay | Admitting: *Deleted

## 2018-03-01 MED ORDER — VITAMIN D (ERGOCALCIFEROL) 1.25 MG (50000 UNIT) PO CAPS: 50000.0000 [IU] | ORAL_CAPSULE | ORAL | 1 refills | Status: DC

## 2018-03-01 NOTE — Telephone Encounter (Signed)
-----   Message from Bo Merino, MD sent at 02/25/2018  8:33 AM EDT ----- Vit D is within normal limits. She should continue VitD 50,000 U q month.

## 2018-03-01 NOTE — Telephone Encounter (Signed)
Prescription sent to the pharmacy.

## 2018-03-14 ENCOUNTER — Other Ambulatory Visit: Payer: Self-pay | Admitting: *Deleted

## 2018-03-14 DIAGNOSIS — L4050 Arthropathic psoriasis, unspecified: Secondary | ICD-10-CM

## 2018-03-14 NOTE — Progress Notes (Signed)
Infusion orders are current for patient CBC CMP Tylenol Benadryl appointments are up to date and follow up appointment  is scheduled TB gold not due yet.  

## 2018-03-24 ENCOUNTER — Ambulatory Visit
Admission: RE | Admit: 2018-03-24 | Discharge: 2018-03-24 | Disposition: A | Discharge status: Home / Self Care | Payer: Medicare Other | Source: Ambulatory Visit | Attending: Family Medicine | Admitting: Family Medicine

## 2018-03-24 DIAGNOSIS — Z1231 Encounter for screening mammogram for malignant neoplasm of breast: Secondary | ICD-10-CM

## 2018-03-28 ENCOUNTER — Other Ambulatory Visit: Payer: Self-pay | Admitting: Family Medicine

## 2018-03-28 DIAGNOSIS — R928 Other abnormal and inconclusive findings on diagnostic imaging of breast: Secondary | ICD-10-CM

## 2018-03-30 ENCOUNTER — Other Ambulatory Visit: Payer: Self-pay | Admitting: Family Medicine

## 2018-03-30 ENCOUNTER — Ambulatory Visit
Admission: RE | Admit: 2018-03-30 | Discharge: 2018-03-30 | Disposition: A | Discharge status: Home / Self Care | Payer: Medicare Other | Source: Ambulatory Visit | Attending: Family Medicine | Admitting: Family Medicine

## 2018-03-30 DIAGNOSIS — R928 Other abnormal and inconclusive findings on diagnostic imaging of breast: Secondary | ICD-10-CM

## 2018-03-30 DIAGNOSIS — N6489 Other specified disorders of breast: Secondary | ICD-10-CM

## 2018-03-31 ENCOUNTER — Ambulatory Visit (HOSPITAL_COMMUNITY)
Admission: RE | Admit: 2018-03-31 | Discharge: 2018-03-31 | Disposition: A | Discharge status: Home / Self Care | Payer: Medicare Other | Source: Ambulatory Visit | Attending: Rheumatology | Admitting: Rheumatology

## 2018-03-31 ENCOUNTER — Telehealth: Payer: Self-pay | Admitting: Rheumatology

## 2018-03-31 DIAGNOSIS — L4050 Arthropathic psoriasis, unspecified: Secondary | ICD-10-CM | POA: Diagnosis not present

## 2018-03-31 LAB — CBC
HCT: 36.1 % (ref 36.0–46.0)
Hemoglobin: 11.9 g/dL — ABNORMAL LOW (ref 12.0–15.0)
MCH: 31.2 pg (ref 26.0–34.0)
MCHC: 33 g/dL (ref 30.0–36.0)
MCV: 94.5 fL (ref 80.0–100.0)
Platelets: 216 10*3/uL (ref 150–400)
RBC: 3.82 MIL/uL — ABNORMAL LOW (ref 3.87–5.11)
RDW: 13.4 % (ref 11.5–15.5)
WBC: 3.7 10*3/uL — ABNORMAL LOW (ref 4.0–10.5)
nRBC: 0 % (ref 0.0–0.2)

## 2018-03-31 LAB — COMPREHENSIVE METABOLIC PANEL
ALT: 12 U/L (ref 0–44)
AST: 22 U/L (ref 15–41)
Albumin: 3.7 g/dL (ref 3.5–5.0)
Alkaline Phosphatase: 68 U/L (ref 38–126)
Anion gap: 8 (ref 5–15)
BUN: 6 mg/dL — ABNORMAL LOW (ref 8–23)
CO2: 28 mmol/L (ref 22–32)
Calcium: 9.7 mg/dL (ref 8.9–10.3)
Chloride: 95 mmol/L — ABNORMAL LOW (ref 98–111)
Creatinine, Ser: 0.67 mg/dL (ref 0.44–1.00)
GFR calc Af Amer: >60 mL/min (ref >60)
GFR calc non Af Amer: >60 mL/min (ref >60)
Glucose, Bld: 126 mg/dL — ABNORMAL HIGH (ref 70–99)
Potassium: 3.3 mmol/L — ABNORMAL LOW (ref 3.5–5.1)
Sodium: 131 mmol/L — ABNORMAL LOW (ref 135–145)
Total Bilirubin: 0.7 mg/dL (ref 0.3–1.2)
Total Protein: 6.3 g/dL — ABNORMAL LOW (ref 6.5–8.1)

## 2018-03-31 MED ORDER — ACETAMINOPHEN 325 MG PO TABS: 650.0000 mg | ORAL_TABLET | ORAL | Status: DC

## 2018-03-31 MED ORDER — DIPHENHYDRAMINE HCL 25 MG PO CAPS: 25.0000 mg | ORAL_CAPSULE | ORAL | Status: DC

## 2018-03-31 MED ORDER — SODIUM CHLORIDE 0.9 % IV SOLN
2.0000 mg/kg | Freq: Once | INTRAVENOUS | Status: AC
  Administered 2018-03-31: 181.25 mg via INTRAVENOUS
  Filled 2018-03-31: qty 14.5

## 2018-03-31 NOTE — Progress Notes (Signed)
Labs are stable.  Potassium is low.  Please notify patient and advised her to discuss this further with her PCP.

## 2018-03-31 NOTE — Telephone Encounter (Signed)
Patient needs to schedule her next infusion for January. Please call to advise.

## 2018-04-04 ENCOUNTER — Ambulatory Visit
Admission: RE | Admit: 2018-04-04 | Discharge: 2018-04-04 | Disposition: A | Discharge status: Home / Self Care | Payer: Medicare Other | Source: Ambulatory Visit | Attending: Family Medicine | Admitting: Family Medicine

## 2018-04-04 ENCOUNTER — Other Ambulatory Visit: Payer: Self-pay | Admitting: *Deleted

## 2018-04-04 DIAGNOSIS — N6489 Other specified disorders of breast: Secondary | ICD-10-CM

## 2018-04-04 NOTE — Telephone Encounter (Signed)
Patient states Laverne at short stay said that the authorization for medication has run through insurance. Patient needs her Simponi Aria to be re-authorized.

## 2018-04-04 NOTE — Progress Notes (Signed)
Infusion orders are current for patient CBC CMP Tylenol Benadryl appointments are up to date and follow up appointment  is scheduled TB gold not due yet.  

## 2018-04-04 NOTE — Telephone Encounter (Signed)
Spoke to patient, she was advised that her benefits for her next infusion (in 2020) will need to be reviewed. Patient confirmed that she will still have Medicare part B and Supplement, both which don't required precertification. Benefit Investigations for 2020 have already been submitted to [facility].

## 2018-04-10 ENCOUNTER — Other Ambulatory Visit: Payer: Self-pay | Admitting: Family Medicine

## 2018-04-13 ENCOUNTER — Other Ambulatory Visit: Payer: Self-pay | Admitting: Family Medicine

## 2018-04-13 MED ORDER — OXYCODONE-ACETAMINOPHEN 5-325 MG PO TABS: 1.0000 | ORAL_TABLET | ORAL | 0 refills | Status: DC | PRN

## 2018-04-13 NOTE — Telephone Encounter (Signed)
Patient requesting a refill on Oxycodone     LOV: 10/15/17  LRF:      02/21/18

## 2018-04-15 ENCOUNTER — Other Ambulatory Visit: Payer: Self-pay | Admitting: General Surgery

## 2018-04-15 ENCOUNTER — Ambulatory Visit: Payer: Self-pay | Admitting: General Surgery

## 2018-04-15 DIAGNOSIS — N6022 Fibroadenosis of left breast: Secondary | ICD-10-CM

## 2018-04-18 ENCOUNTER — Other Ambulatory Visit: Payer: Self-pay | Admitting: Rheumatology

## 2018-04-18 NOTE — Telephone Encounter (Signed)
Last Visit: 02/24/2018 Next Visit: 07/26/2018 Labs: 03/31/2018 Labs are stable. Potassium is low  Okay to refill per Dr. Estanislado Pandy.

## 2018-04-25 ENCOUNTER — Encounter (HOSPITAL_BASED_OUTPATIENT_CLINIC_OR_DEPARTMENT_OTHER): Payer: Self-pay | Admitting: *Deleted

## 2018-04-25 ENCOUNTER — Other Ambulatory Visit: Payer: Self-pay

## 2018-04-25 ENCOUNTER — Telehealth: Payer: Self-pay | Admitting: Family Medicine

## 2018-04-25 NOTE — Telephone Encounter (Signed)
Patient would like you to call her in reference to her potassium is low and if she doesn't start taken something they won't be able to preform surgery on Wednesday. She would like to know if she can just take the klor-con m 20 mg she has this at home. She would like a call back if anyway possible.   CB# [phone]

## 2018-04-25 NOTE — Progress Notes (Signed)
Patient's potassium was 3.3 on labs done 3wks ago at Dr Arlean Hopping office. She states she forgot to call Dr Dennard Schaumann her PCP to let him know. I instructed her to call the office today for possible replacement and to come in on Fri 04-29-18 for recheck.

## 2018-04-26 ENCOUNTER — Other Ambulatory Visit: Payer: Self-pay

## 2018-04-26 ENCOUNTER — Encounter (HOSPITAL_BASED_OUTPATIENT_CLINIC_OR_DEPARTMENT_OTHER)
Admission: RE | Admit: 2018-04-26 | Discharge: 2018-04-26 | Disposition: A | Discharge status: Home / Self Care | Payer: Medicare Other | Source: Ambulatory Visit | Attending: General Surgery | Admitting: General Surgery

## 2018-04-26 DIAGNOSIS — Z01818 Encounter for other preprocedural examination: Secondary | ICD-10-CM | POA: Diagnosis not present

## 2018-04-26 LAB — BASIC METABOLIC PANEL
Anion gap: 9 (ref 5–15)
BUN: <5 mg/dL — ABNORMAL LOW (ref 8–23)
CO2: 28 mmol/L (ref 22–32)
Calcium: 9.6 mg/dL (ref 8.9–10.3)
Chloride: 95 mmol/L — ABNORMAL LOW (ref 98–111)
Creatinine, Ser: 0.87 mg/dL (ref 0.44–1.00)
GFR calc Af Amer: >60 mL/min (ref >60)
GFR calc non Af Amer: >60 mL/min (ref >60)
Glucose, Bld: 95 mg/dL (ref 70–99)
Potassium: 4 mmol/L (ref 3.5–5.1)
Sodium: 132 mmol/L — ABNORMAL LOW (ref 135–145)

## 2018-04-26 NOTE — Telephone Encounter (Signed)
That was drawn 1 month ago?  I would repeat the lab but if her surgery is tomorrow she could take 2 tabs of 20 meq of Kdur today (40 meq total) then repeat the level.

## 2018-04-26 NOTE — Progress Notes (Signed)
EKG and chart reviewed with Dr. Gifford Shave at pt bedside. Pt denies SOB, or chest pain. No new orders, and OK to proceed with surgery as scheduled at day surgery center.

## 2018-04-27 NOTE — Telephone Encounter (Signed)
Spoke to pt on 04/26/18 and aware of providers recommendations to have redrawn as her potassium has not been low in the past per our records. Pt states she will have redrawn at her pre-op at hospital. Potassium repeat was 4.0 - no need for potassium therapy.

## 2018-05-03 ENCOUNTER — Ambulatory Visit
Admission: RE | Admit: 2018-05-03 | Discharge: 2018-05-03 | Disposition: A | Discharge status: Home / Self Care | Payer: Medicare Other | Source: Ambulatory Visit | Attending: General Surgery | Admitting: General Surgery

## 2018-05-03 DIAGNOSIS — N6022 Fibroadenosis of left breast: Secondary | ICD-10-CM

## 2018-05-04 ENCOUNTER — Encounter (HOSPITAL_BASED_OUTPATIENT_CLINIC_OR_DEPARTMENT_OTHER)
Admission: RE | Disposition: A | Discharge status: Home / Self Care | Payer: Self-pay | Source: Ambulatory Visit | Attending: General Surgery

## 2018-05-04 ENCOUNTER — Other Ambulatory Visit: Payer: Self-pay

## 2018-05-04 ENCOUNTER — Encounter (HOSPITAL_BASED_OUTPATIENT_CLINIC_OR_DEPARTMENT_OTHER): Payer: Self-pay | Admitting: Certified Registered"

## 2018-05-04 ENCOUNTER — Ambulatory Visit (HOSPITAL_BASED_OUTPATIENT_CLINIC_OR_DEPARTMENT_OTHER)
Admission: RE | Admit: 2018-05-04 | Discharge: 2018-05-04 | Disposition: A | Discharge status: Home / Self Care | Payer: Medicare Other | Source: Ambulatory Visit | Attending: General Surgery | Admitting: General Surgery

## 2018-05-04 ENCOUNTER — Ambulatory Visit (HOSPITAL_BASED_OUTPATIENT_CLINIC_OR_DEPARTMENT_OTHER): Payer: Medicare Other | Admitting: Anesthesiology

## 2018-05-04 ENCOUNTER — Ambulatory Visit
Admission: RE | Admit: 2018-05-04 | Discharge: 2018-05-04 | Disposition: A | Discharge status: Home / Self Care | Payer: Medicare Other | Source: Ambulatory Visit | Attending: General Surgery | Admitting: General Surgery

## 2018-05-04 DIAGNOSIS — F329 Major depressive disorder, single episode, unspecified: Secondary | ICD-10-CM | POA: Insufficient documentation

## 2018-05-04 DIAGNOSIS — Z881 Allergy status to other antibiotic agents status: Secondary | ICD-10-CM | POA: Insufficient documentation

## 2018-05-04 DIAGNOSIS — Z87891 Personal history of nicotine dependence: Secondary | ICD-10-CM | POA: Insufficient documentation

## 2018-05-04 DIAGNOSIS — N6022 Fibroadenosis of left breast: Secondary | ICD-10-CM

## 2018-05-04 DIAGNOSIS — N6489 Other specified disorders of breast: Secondary | ICD-10-CM | POA: Diagnosis not present

## 2018-05-04 DIAGNOSIS — Z79899 Other long term (current) drug therapy: Secondary | ICD-10-CM | POA: Insufficient documentation

## 2018-05-04 DIAGNOSIS — L905 Scar conditions and fibrosis of skin: Secondary | ICD-10-CM | POA: Diagnosis not present

## 2018-05-04 DIAGNOSIS — E7800 Pure hypercholesterolemia, unspecified: Secondary | ICD-10-CM | POA: Diagnosis not present

## 2018-05-04 DIAGNOSIS — Z880 Allergy status to penicillin: Secondary | ICD-10-CM | POA: Diagnosis not present

## 2018-05-04 DIAGNOSIS — Z803 Family history of malignant neoplasm of breast: Secondary | ICD-10-CM | POA: Insufficient documentation

## 2018-05-04 DIAGNOSIS — K219 Gastro-esophageal reflux disease without esophagitis: Secondary | ICD-10-CM | POA: Insufficient documentation

## 2018-05-04 DIAGNOSIS — F419 Anxiety disorder, unspecified: Secondary | ICD-10-CM | POA: Insufficient documentation

## 2018-05-04 DIAGNOSIS — I10 Essential (primary) hypertension: Secondary | ICD-10-CM | POA: Insufficient documentation

## 2018-05-04 HISTORY — PX: BREAST LUMPECTOMY WITH RADIOACTIVE SEED LOCALIZATION: SHX6424

## 2018-05-04 HISTORY — DX: Unspecified lump in the left breast, unspecified quadrant: N63.20

## 2018-05-04 HISTORY — PX: BREAST EXCISIONAL BIOPSY: SUR124

## 2018-05-04 SURGERY — BREAST LUMPECTOMY WITH RADIOACTIVE SEED LOCALIZATION
Anesthesia: General | Site: Breast | Laterality: Left

## 2018-05-04 MED ORDER — FENTANYL CITRATE (PF) 100 MCG/2ML IJ SOLN
25.0000 ug | INTRAMUSCULAR | Status: DC | PRN
  Administered 2018-05-04 (×3): 50 ug via INTRAVENOUS

## 2018-05-04 MED ORDER — HYDROCODONE-ACETAMINOPHEN 5-325 MG PO TABS
1.0000 | ORAL_TABLET | Freq: Once | ORAL | Status: AC
  Administered 2018-05-04: 1 via ORAL

## 2018-05-04 MED ORDER — DEXAMETHASONE SODIUM PHOSPHATE 4 MG/ML IJ SOLN
INTRAMUSCULAR | Status: DC | PRN
  Administered 2018-05-04: 10 mg via INTRAVENOUS

## 2018-05-04 MED ORDER — PROPOFOL 500 MG/50ML IV EMUL
INTRAVENOUS | Status: DC | PRN
  Administered 2018-05-04: 125 ug via INTRAVENOUS

## 2018-05-04 MED ORDER — ACETAMINOPHEN 500 MG PO TABS: 1000.0000 mg | ORAL_TABLET | ORAL | Status: DC

## 2018-05-04 MED ORDER — CHLORHEXIDINE GLUCONATE CLOTH 2 % EX PADS: 6.0000 | MEDICATED_PAD | Freq: Once | CUTANEOUS | Status: DC

## 2018-05-04 MED ORDER — VANCOMYCIN HCL IN DEXTROSE 1-5 GM/200ML-% IV SOLN
INTRAVENOUS | Status: AC
  Filled 2018-05-04: qty 200

## 2018-05-04 MED ORDER — HYDROCODONE-ACETAMINOPHEN 5-325 MG PO TABS: 1.0000 | ORAL_TABLET | Freq: Four times a day (QID) | ORAL | 0 refills | Status: DC | PRN

## 2018-05-04 MED ORDER — GABAPENTIN 300 MG PO CAPS
300.0000 mg | ORAL_CAPSULE | ORAL | Status: AC
  Administered 2018-05-04: 300 mg via ORAL

## 2018-05-04 MED ORDER — VANCOMYCIN HCL IN DEXTROSE 1-5 GM/200ML-% IV SOLN
1000.0000 mg | INTRAVENOUS | Status: AC
  Administered 2018-05-04: 1000 mg via INTRAVENOUS

## 2018-05-04 MED ORDER — ACETAMINOPHEN 500 MG PO TABS
ORAL_TABLET | ORAL | Status: AC
  Filled 2018-05-04: qty 2

## 2018-05-04 MED ORDER — FENTANYL CITRATE (PF) 100 MCG/2ML IJ SOLN
50.0000 ug | INTRAMUSCULAR | Status: DC | PRN
  Administered 2018-05-04 (×2): 50 ug via INTRAVENOUS

## 2018-05-04 MED ORDER — HYDROCODONE-ACETAMINOPHEN 5-325 MG PO TABS
ORAL_TABLET | ORAL | Status: AC
  Filled 2018-05-04: qty 1

## 2018-05-04 MED ORDER — FENTANYL CITRATE (PF) 100 MCG/2ML IJ SOLN
INTRAMUSCULAR | Status: AC
  Filled 2018-05-04: qty 2

## 2018-05-04 MED ORDER — MIDAZOLAM HCL 2 MG/2ML IJ SOLN
INTRAMUSCULAR | Status: AC
  Filled 2018-05-04: qty 2

## 2018-05-04 MED ORDER — LACTATED RINGERS IV SOLN
INTRAVENOUS | Status: DC
  Administered 2018-05-04 (×2): via INTRAVENOUS

## 2018-05-04 MED ORDER — BUPIVACAINE-EPINEPHRINE (PF) 0.25% -1:200000 IJ SOLN
INTRAMUSCULAR | Status: DC | PRN
  Administered 2018-05-04: 20 mL via PERINEURAL

## 2018-05-04 MED ORDER — SCOPOLAMINE 1 MG/3DAYS TD PT72: 1.0000 | MEDICATED_PATCH | Freq: Once | TRANSDERMAL | Status: DC | PRN

## 2018-05-04 MED ORDER — ONDANSETRON HCL 4 MG/2ML IJ SOLN
INTRAMUSCULAR | Status: DC | PRN
  Administered 2018-05-04: 4 mg via INTRAVENOUS

## 2018-05-04 MED ORDER — MIDAZOLAM HCL 2 MG/2ML IJ SOLN: 1.0000 mg | INTRAMUSCULAR | Status: DC | PRN

## 2018-05-04 MED ORDER — LIDOCAINE HCL (CARDIAC) PF 100 MG/5ML IV SOSY
PREFILLED_SYRINGE | INTRAVENOUS | Status: DC | PRN
  Administered 2018-05-04: 60 mg via INTRAVENOUS

## 2018-05-04 MED ORDER — GABAPENTIN 300 MG PO CAPS
ORAL_CAPSULE | ORAL | Status: AC
  Filled 2018-05-04: qty 1

## 2018-05-04 MED ORDER — PROPOFOL 10 MG/ML IV BOLUS
INTRAVENOUS | Status: DC | PRN
  Administered 2018-05-04: 80 mg via INTRAVENOUS

## 2018-05-04 SURGICAL SUPPLY — 46 items
ADH SKN CLS APL DERMABOND .7 (GAUZE/BANDAGES/DRESSINGS) ×1
APPLIER CLIP 9.375 MED OPEN (MISCELLANEOUS)
APR CLP MED 9.3 20 MLT OPN (MISCELLANEOUS)
BLADE SURG 15 STRL LF DISP TIS (BLADE) ×1 IMPLANT
BLADE SURG 15 STRL SS (BLADE) ×2
CANISTER SUC SOCK COL 7IN (MISCELLANEOUS) ×1 IMPLANT
CANISTER SUCT 1200ML W/VALVE (MISCELLANEOUS) ×1 IMPLANT
CHLORAPREP W/TINT 26ML (MISCELLANEOUS) ×2 IMPLANT
CLIP APPLIE 9.375 MED OPEN (MISCELLANEOUS) IMPLANT
COVER BACK TABLE 60X90IN (DRAPES) ×2 IMPLANT
COVER MAYO STAND STRL (DRAPES) ×2 IMPLANT
COVER PROBE W GEL 5X96 (DRAPES) ×2 IMPLANT
COVER WAND RF STERILE (DRAPES) IMPLANT
DECANTER SPIKE VIAL GLASS SM (MISCELLANEOUS) IMPLANT
DERMABOND ADVANCED (GAUZE/BANDAGES/DRESSINGS) ×1
DERMABOND ADVANCED .7 DNX12 (GAUZE/BANDAGES/DRESSINGS) ×1 IMPLANT
DRAPE LAPAROSCOPIC ABDOMINAL (DRAPES) ×2 IMPLANT
DRAPE UTILITY XL STRL (DRAPES) ×2 IMPLANT
ELECT COATED BLADE 2.86 ST (ELECTRODE) ×2 IMPLANT
ELECT REM PT RETURN 9FT ADLT (ELECTROSURGICAL) ×2
ELECTRODE REM PT RTRN 9FT ADLT (ELECTROSURGICAL) ×1 IMPLANT
GLOVE BIO SURGEON STRL SZ7.5 (GLOVE) ×2 IMPLANT
GLOVE EXAM NITRILE MD LF STRL (GLOVE) ×1 IMPLANT
GLOVE SURG SS PI 6.5 STRL IVOR (GLOVE) ×1 IMPLANT
GLOVE SURG SS PI 7.5 STRL IVOR (GLOVE) ×2 IMPLANT
GOWN STRL REUS W/ TWL LRG LVL3 (GOWN DISPOSABLE) ×2 IMPLANT
GOWN STRL REUS W/TWL LRG LVL3 (GOWN DISPOSABLE) ×4
ILLUMINATOR WAVEGUIDE N/F (MISCELLANEOUS) IMPLANT
KIT MARKER MARGIN INK (KITS) ×2 IMPLANT
LIGHT WAVEGUIDE WIDE FLAT (MISCELLANEOUS) IMPLANT
NDL HYPO 25X1 1.5 SAFETY (NEEDLE) IMPLANT
NEEDLE HYPO 25X1 1.5 SAFETY (NEEDLE) ×2 IMPLANT
NS IRRIG 1000ML POUR BTL (IV SOLUTION) ×1 IMPLANT
PACK BASIN DAY SURGERY FS (CUSTOM PROCEDURE TRAY) ×2 IMPLANT
PENCIL BUTTON HOLSTER BLD 10FT (ELECTRODE) ×2 IMPLANT
SLEEVE SCD COMPRESS KNEE MED (MISCELLANEOUS) ×2 IMPLANT
SPONGE LAP 18X18 RF (DISPOSABLE) ×2 IMPLANT
SUT MON AB 4-0 PC3 18 (SUTURE) ×1 IMPLANT
SUT SILK 2 0 SH (SUTURE) IMPLANT
SUT VICRYL 3-0 CR8 SH (SUTURE) ×2 IMPLANT
SYR CONTROL 10ML LL (SYRINGE) ×1 IMPLANT
TOWEL GREEN STERILE FF (TOWEL DISPOSABLE) ×2 IMPLANT
TOWEL OR NON WOVEN STRL DISP B (DISPOSABLE) ×1 IMPLANT
TRAY FAXITRON CT DISP (TRAY / TRAY PROCEDURE) ×2 IMPLANT
TUBE CONNECTING 20X1/4 (TUBING) ×1 IMPLANT
YANKAUER SUCT BULB TIP NO VENT (SUCTIONS) IMPLANT

## 2018-05-04 NOTE — Interval H&P Note (Signed)
History and Physical Interval Note:  05/04/2018 10:01 AM  Janet Peterson  has presented today for surgery, with the diagnosis of left complex sclerosing lesion  The various methods of treatment have been discussed with the patient and family. After consideration of risks, benefits and other options for treatment, the patient has consented to  Procedure(s): LEFT BREAST LUMPECTOMY WITH RADIOACTIVE SEED LOCALIZATION AND LEFT BREAST NIPPLE BIOPSY (Left) as a surgical intervention .  The patient's history has been reviewed, patient examined, no change in status, stable for surgery.  I have reviewed the patient's chart and labs.  Questions were answered to the patient's satisfaction.     [name]

## 2018-05-04 NOTE — Anesthesia Preprocedure Evaluation (Addendum)
Anesthesia Evaluation  Patient identified by MRN, date of birth, ID band Patient awake    Reviewed: Allergy & Precautions, NPO status , Patient's Chart, lab work & pertinent test results, reviewed documented beta blocker date and time   History of Anesthesia Complications (+) PONV and history of anesthetic complications  Airway Mallampati: IV   Neck ROM: Limited  Mouth opening: Limited Mouth Opening  Dental  (+) Edentulous Lower, Edentulous Upper   Pulmonary sleep apnea , former smoker,    breath sounds clear to auscultation       Cardiovascular hypertension, Pt. on medications and Pt. on home beta blockers  Rhythm:Regular Rate:Normal     Neuro/Psych PSYCHIATRIC DISORDERS Anxiety Depression negative neurological ROS     GI/Hepatic Neg liver ROS, GERD  Medicated,  Endo/Other  negative endocrine ROS  Renal/GU negative Renal ROS  negative genitourinary   Musculoskeletal  (+) Arthritis , Rheumatoid disorders,  Fibromyalgia -  Abdominal   Peds  Hematology negative hematology ROS (+)   Anesthesia Other Findings Left complex sclerosing lesion Ankylosing spondylitis  Reproductive/Obstetrics                           Anesthesia Physical Anesthesia Plan  ASA: III  Anesthesia Plan: General   Post-op Pain Management:    Induction: Intravenous  PONV Risk Score and Plan: 4 or greater and Ondansetron, Dexamethasone, Midazolam and Scopolamine patch - Pre-op  Airway Management Planned: LMA  Additional Equipment:   Intra-op Plan:   Post-operative Plan: Extubation in OR  Informed Consent: I have reviewed the patients History and Physical, chart, labs and discussed the procedure including the risks, benefits and alternatives for the proposed anesthesia with the patient or authorized representative who has indicated his/her understanding and acceptance.   Dental advisory given  Plan Discussed  with: CRNA  Anesthesia Plan Comments:         Anesthesia Quick Evaluation

## 2018-05-04 NOTE — Transfer of Care (Signed)
Immediate Anesthesia Transfer of Care Note  Patient: Janet Peterson  Procedure(s) Performed: LEFT BREAST LUMPECTOMY WITH RADIOACTIVE SEED LOCALIZATION AND LEFT BREAST NIPPLE BIOPSY (Left Breast)  Patient Location: PACU  Anesthesia Type:General  Level of Consciousness: awake, alert , oriented and patient cooperative  Airway & Oxygen Therapy: Patient Spontanous Breathing and Patient connected to face mask oxygen  Post-op Assessment: Report given to RN and Post -op Vital signs reviewed and stable  Post vital signs: Reviewed and stable  Last Vitals:  Vitals Value Taken Time  BP    Temp    Pulse 81 05/04/2018 11:25 AM  Resp 13 05/04/2018 11:25 AM  SpO2 98 % 05/04/2018 11:25 AM  Vitals shown include unvalidated device data.  Last Pain:  Vitals:   05/04/18 0850  TempSrc: Oral  PainSc: 0-No pain         Complications: No apparent anesthesia complications

## 2018-05-04 NOTE — Anesthesia Procedure Notes (Signed)
Procedure Name: LMA Insertion Date/Time: 05/04/2018 10:21 AM Performed by: Signe Colt, CRNA Pre-anesthesia Checklist: Patient identified, Emergency Drugs available, Suction available and Patient being monitored Patient Re-evaluated:Patient Re-evaluated prior to induction Oxygen Delivery Method: Circle system utilized Preoxygenation: Pre-oxygenation with 100% oxygen Induction Type: IV induction Ventilation: Mask ventilation without difficulty LMA: LMA inserted LMA Size: 4.0 Number of attempts: 1 Airway Equipment and Method: Bite block Placement Confirmation: positive ETCO2 Tube secured with: Tape Dental Injury: Teeth and Oropharynx as per pre-operative assessment

## 2018-05-04 NOTE — Op Note (Signed)
05/04/2018  11:22 AM  PATIENT:  Janet Peterson  67 y.o. female  PRE-OPERATIVE DIAGNOSIS:  left complex sclerosing lesion, lesion left nipple  POST-OPERATIVE DIAGNOSIS:  left complex sclerosing lesion, lesion left nipple  PROCEDURE:  Procedure(s): LEFT BREAST LUMPECTOMY WITH RADIOACTIVE SEED LOCALIZATION AND LEFT BREAST NIPPLE BIOPSY (Left)  SURGEON:  Surgeon(s) and Role:    Jovita Kussmaul, MD - Primary  PHYSICIAN ASSISTANT:   ASSISTANTS: none   ANESTHESIA:   local and general  EBL:  minimal   BLOOD ADMINISTERED:none  DRAINS: none   LOCAL MEDICATIONS USED:  MARCAINE     SPECIMEN:  Source of Specimen:  left nipple lesion, left breast tissue  DISPOSITION OF SPECIMEN:  PATHOLOGY  COUNTS:  YES  TOURNIQUET:  * No tourniquets in log *  DICTATION: .Dragon Dictation   After informed consent was obtained the patient was brought to the operating room and placed in the supine position on the operating table.  After adequate induction of general anesthesia the patient's left breast was prepped with ChloraPrep, allowed to dry, and draped in usual sterile manner.  An appropriate timeout was performed.  Previously an I-125 seed was placed in the upper inner quadrant of the left breast to mark an area of a complex sclerosing lesion.  The neoprobe was set to I-125 in the area of radioactivity was readily identified.  The area around this was infiltrated with quarter percent Marcaine.  A curvilinear incision was made along the upper inner edge of the areola of the left breast.  The incision was carried through the skin and subcutaneous tissue sharply with the electrocautery.  The dissection was then carried superficially into the upper inner quadrant sharply with electrocautery.  Once we were well beyond the area of radioactivity then a circular portion of breast tissue was excised sharply with the electrocautery around the radioactive seed while checking the area of radioactivity frequently.   Once the specimen was removed it was oriented with the appropriate paint colors.  A specimen radiograph was obtained that showed the clip and seed to be near the center of the specimen.  The specimen was then sent to pathology for further evaluation.  Hemostasis was achieved using the Bovie electrocautery.  The wound was irrigated with saline and infiltrated with more quarter percent Marcaine.  The deep layer of the wound was then closed with layers of interrupted 3-0 Vicryl stitches.  The skin was then closed with interrupted 4-0 Monocryl subcuticular stitches.  Dermabond dressings were applied.  Attention was then turned to the left nipple.  There was a small dark lesion on the inner aspect of the left nipple.  This was shaved off sharply with a 15 blade knife.  Hemostasis was achieved using the Bovie electrocautery.  The small incision on the nipple was then closed with Dermabond.  The specimen was sent also sent to pathology for further evaluation.  The patient tolerated the procedure well.  At the end of the case all needle sponge and instrument counts were correct.  The patient was then awakened and taken to recovery in stable condition.  PLAN OF CARE: Discharge to home after PACU  PATIENT DISPOSITION:  PACU - hemodynamically stable.   Delay start of Pharmacological VTE agent (>24hrs) due to surgical blood loss or risk of bleeding: not applicable

## 2018-05-04 NOTE — Discharge Instructions (Signed)

## 2018-05-04 NOTE — H&P (Signed)
Janet Peterson  Location: [facility] Patient #: [id number] DOB: 11-10-1950 Married / Language: English / Race: White Female   History of Present Illness  The patient is a 67 year old female who presents with a breast mass. We are asked to see the patient in consultation by Dr. Lovey Newcomer to evaluate her for a left breast complex sclerosing lesion. The patient is a 67 year old white female who recently went for a routine screening mammogram. At that time she was found to have a small area of distortion in the upper inner quadrant of the left breast. They could not measure the distortion. She denied any breast pain or discharge from the nipple. She does have a history of breast cancer in a maternal aunt. The distortion was biopsied and came back as a complex sclerosing lesion with atypia.   Past Surgical History  Breast Biopsy  Bilateral. Cataract Surgery  Bilateral. Cesarean Section - 1  Colon Polyp Removal - Colonoscopy  Foot Surgery  Right. Knee Surgery  Left. Oral Surgery  Shoulder Surgery  Right. Spinal Surgery - Lower Back  Spinal Surgery - Neck  Spinal Surgery Midback  Tonsillectomy   Diagnostic Studies History Colonoscopy  within last year Mammogram  within last year  Allergies  Augmentin *PENICILLINS*  GI upset Sulfa Drugs  GI upset Latex   Medication History oxyCODONE-Acetaminophen (5-325MG  Tablet, Oral) Active. buPROPion HCl ER (XL) (300MG  Tablet ER 24HR, Oral) Active. clomiPRAMINE HCl (75MG  Capsule, Oral) Active. (Anafranil) Ezetimibe (10MG  Tablet, Oral) Active. hydroCHLOROthiazide (25MG  Tablet, Oral) Active. Metoprolol Succinate ER (50MG  Tablet ER 24HR, Oral) Active. Omeprazole (40MG  Capsule DR, Oral) Active. traZODone HCl (100MG  Tablet, Oral) Active. Valsartan (320MG  Tablet, Oral) Active. Vitamin D (Ergocalciferol) (50000UNIT Capsule, Oral) Active. Potassium Chloride (20MEQ Tablet ER, Oral) Active. Leflunomide  (20MG  Tablet, Oral) Active. Flonase (50MCG/DOSE Inhaler, Nasal) Active. Simponi (50MG /0.5ML Soln Pref Syr, Subcutaneous) Active. Medications Reconciled clonazePAM (1MG  Tablet, Oral) Active.  Social History  Alcohol use  Occasional alcohol use. Caffeine use  Carbonated beverages, Coffee, Tea. Illicit drug use  Prefer to discuss with provider. Tobacco use  Former smoker.  Family History Arthritis  Brother, Father, Mother. Breast Cancer  Family Members In General. Cerebrovascular Accident  Mother. Depression  Mother, Son. Diabetes Mellitus  Brother, Father, Mother. Heart Disease  Brother, Father, Mother. Heart disease in female family member before age 36  Heart disease in female family member before age 87  Hypertension  Brother, Father, Mother. Melanoma  Brother.  Pregnancy / Birth History Age at menarche  60 years. Age of menopause  <44 Contraceptive History  Contraceptive implant, Oral contraceptives. Gravida  1 Maternal age  38-35 Para  2  Other Problems  Anxiety Disorder  Arthritis  Back Pain  Depression  Gastroesophageal Reflux Disease  High blood pressure  Hypercholesterolemia  Melanoma  Migraine Headache     Review of Systems  General Present- Fatigue and Weight Gain. Not Present- Appetite Loss, Chills, Fever, Night Sweats and Weight Loss. Skin Present- Change in Wart/Mole, Dryness and New Lesions. Not Present- Hives, Jaundice, Non-Healing Wounds, Rash and Ulcer. HEENT Present- Seasonal Allergies. Not Present- Earache, Hearing Loss, Hoarseness, Nose Bleed, Oral Ulcers, Ringing in the Ears, Sinus Pain, Sore Throat, Visual Disturbances, Wears glasses/contact lenses and Yellow Eyes. Respiratory Present- Difficulty Breathing. Not Present- Bloody sputum, Chronic Cough, Snoring and Wheezing. Breast Present- Skin Changes. Not Present- Breast Mass, Breast Pain and Nipple Discharge. Cardiovascular Present- Shortness of Breath and Swelling  of Extremities. Not Present- Chest Pain, Difficulty Breathing  Lying Down, Leg Cramps, Palpitations and Rapid Heart Rate. Gastrointestinal Not Present- Abdominal Pain, Bloating, Bloody Stool, Change in Bowel Habits, Chronic diarrhea, Constipation, Difficulty Swallowing, Excessive gas, Gets full quickly at meals, Hemorrhoids, Indigestion, Nausea, Rectal Pain and Vomiting. Female Genitourinary Present- Frequency. Not Present- Nocturia, Painful Urination, Pelvic Pain and Urgency. Musculoskeletal Present- Back Pain, Joint Pain, Joint Stiffness, Muscle Weakness and Swelling of Extremities. Not Present- Muscle Pain. Neurological Present- Trouble walking. Not Present- Decreased Memory, Fainting, Headaches, Numbness, Seizures, Tingling, Tremor and Weakness. Psychiatric Present- Anxiety and Depression. Not Present- Bipolar, Change in Sleep Pattern, Fearful and Frequent crying. Endocrine Present- Cold Intolerance. Not Present- Excessive Hunger, Hair Changes, Heat Intolerance, Hot flashes and New Diabetes. Hematology Present- Easy Bruising. Not Present- Blood Thinners, Excessive bleeding, Gland problems, HIV and Persistent Infections.  Vitals Weight: 198.2 lb Height: 64in Body Surface Area: 1.95 m Body Mass Index: 34.02 kg/m  Temp.: 97.77F(Temporal)  Pulse: 69 (Regular)  BP: 122/84 (Sitting, Right Arm, Standard)       Physical Exam  General Mental Status-Alert. General Appearance-Consistent with stated age. Hydration-Well hydrated. Voice-Normal.  Head and Neck Head-normocephalic, atraumatic with no lesions or palpable masses. Trachea-midline. Thyroid Gland Characteristics - normal size and consistency.  Eye Eyeball - Bilateral-Extraocular movements intact. Sclera/Conjunctiva - Bilateral-No scleral icterus.  Chest and Lung Exam Chest and lung exam reveals -quiet, even and easy respiratory effort with no use of accessory muscles and on auscultation, normal  breath sounds, no adventitious sounds and normal vocal resonance. Inspection Chest Wall - Normal. Back - normal.  Breast Note: There is a palpable hematoma measuring 3-4 cm in the upper inner quadrant of the left breast. There is no other palpable mass in either breast. There is no palpable axillary, supraclavicular, or cervical lymphadenopathy. She also has a small discoloration on the inner aspect of the left nipple that has been present for about 3 months.   Cardiovascular Cardiovascular examination reveals -normal heart sounds, regular rate and rhythm with no murmurs and normal pedal pulses bilaterally.  Abdomen Inspection Inspection of the abdomen reveals - No Hernias. Skin - Scar - no surgical scars. Palpation/Percussion Palpation and Percussion of the abdomen reveal - Soft, Non Tender, No Rebound tenderness, No Rigidity (guarding) and No hepatosplenomegaly. Auscultation Auscultation of the abdomen reveals - Bowel sounds normal.  Neurologic Neurologic evaluation reveals -alert and oriented x 3 with no impairment of recent or remote memory. Mental Status-Normal.  Musculoskeletal Normal Exam - Left-Upper Extremity Strength Normal and Lower Extremity Strength Normal. Normal Exam - Right-Upper Extremity Strength Normal and Lower Extremity Strength Normal.  Lymphatic Head & Neck  General Head & Neck Lymphatics: Bilateral - Description - Normal. Axillary  General Axillary Region: Bilateral - Description - Normal. Tenderness - Non Tender. Femoral & Inguinal  Generalized Femoral & Inguinal Lymphatics: Bilateral - Description - Normal. Tenderness - Non Tender.    Assessment & Plan  SCLEROSING ADENOSIS OF BREAST, LEFT (N60.22) Impression: The patient appears to have a small area of distortion consistent with a complex sclerosing lesion in the upper inner left breast. Because this is associated with atypical cells I think it would be best to have this area removed. I  have discussed with her in detail the risks and benefits of the operation to do this as well as some of the technical aspects and she understands and wishes to proceed. I will plan for a left breast radioactive seed localized lumpectomy. If her hematoma does not resolve she may need a wire instead. She also  has a discoloration on the inner aspect of the left nipple that she would like to have removed. I think this can be done at the same time as her breast surgery. Current Plans Pt Education - Breast Diseases: discussed with patient and provided information.

## 2018-05-04 NOTE — Anesthesia Postprocedure Evaluation (Signed)
Anesthesia Post Note  Patient: Janet Peterson  Procedure(s) Performed: LEFT BREAST LUMPECTOMY WITH RADIOACTIVE SEED LOCALIZATION AND LEFT BREAST NIPPLE BIOPSY (Left Breast)     Patient location during evaluation: PACU Anesthesia Type: General Level of consciousness: awake and alert Pain management: pain level controlled Vital Signs Assessment: post-procedure vital signs reviewed and stable Respiratory status: spontaneous breathing, nonlabored ventilation, respiratory function stable and patient connected to nasal cannula oxygen Cardiovascular status: blood pressure returned to baseline and stable Postop Assessment: no apparent nausea or vomiting Anesthetic complications: no    Last Vitals:  Vitals:   05/04/18 1215 05/04/18 1218  BP:  130/84  Pulse: 74 74  Resp: 16 14  Temp:    SpO2: 100% 97%    Last Pain:  Vitals:   05/04/18 1218  TempSrc:   PainSc: 7                  Chelsey L Woodrum

## 2018-05-05 ENCOUNTER — Encounter (HOSPITAL_BASED_OUTPATIENT_CLINIC_OR_DEPARTMENT_OTHER): Payer: Self-pay | Admitting: General Surgery

## 2018-05-07 ENCOUNTER — Other Ambulatory Visit: Payer: Self-pay

## 2018-05-07 ENCOUNTER — Emergency Department (HOSPITAL_COMMUNITY)
Admission: EM | Admit: 2018-05-07 | Discharge: 2018-05-07 | Disposition: A | Discharge status: Home / Self Care | Payer: Medicare Other | Attending: Emergency Medicine | Admitting: Emergency Medicine

## 2018-05-07 ENCOUNTER — Encounter (HOSPITAL_COMMUNITY): Payer: Self-pay | Admitting: Emergency Medicine

## 2018-05-07 DIAGNOSIS — L259 Unspecified contact dermatitis, unspecified cause: Secondary | ICD-10-CM | POA: Diagnosis not present

## 2018-05-07 DIAGNOSIS — Z79899 Other long term (current) drug therapy: Secondary | ICD-10-CM | POA: Diagnosis not present

## 2018-05-07 DIAGNOSIS — R21 Rash and other nonspecific skin eruption: Secondary | ICD-10-CM | POA: Diagnosis present

## 2018-05-07 DIAGNOSIS — Z87891 Personal history of nicotine dependence: Secondary | ICD-10-CM | POA: Insufficient documentation

## 2018-05-07 DIAGNOSIS — I10 Essential (primary) hypertension: Secondary | ICD-10-CM | POA: Diagnosis not present

## 2018-05-07 DIAGNOSIS — L4050 Arthropathic psoriasis, unspecified: Secondary | ICD-10-CM | POA: Insufficient documentation

## 2018-05-07 LAB — COMPREHENSIVE METABOLIC PANEL
ALT: 16 U/L (ref 0–44)
AST: 21 U/L (ref 15–41)
Albumin: 3.6 g/dL (ref 3.5–5.0)
Alkaline Phosphatase: 72 U/L (ref 38–126)
Anion gap: 8 (ref 5–15)
BUN: 9 mg/dL (ref 8–23)
CO2: 27 mmol/L (ref 22–32)
Calcium: 9 mg/dL (ref 8.9–10.3)
Chloride: 97 mmol/L — ABNORMAL LOW (ref 98–111)
Creatinine, Ser: 0.72 mg/dL (ref 0.44–1.00)
GFR calc Af Amer: >60 mL/min (ref >60)
GFR calc non Af Amer: >60 mL/min (ref >60)
Glucose, Bld: 122 mg/dL — ABNORMAL HIGH (ref 70–99)
Potassium: 3.2 mmol/L — ABNORMAL LOW (ref 3.5–5.1)
Sodium: 132 mmol/L — ABNORMAL LOW (ref 135–145)
Total Bilirubin: 0.3 mg/dL (ref 0.3–1.2)
Total Protein: 6 g/dL — ABNORMAL LOW (ref 6.5–8.1)

## 2018-05-07 LAB — CBC
HCT: 35.5 % — ABNORMAL LOW (ref 36.0–46.0)
Hemoglobin: 11.5 g/dL — ABNORMAL LOW (ref 12.0–15.0)
MCH: 32.2 pg (ref 26.0–34.0)
MCHC: 32.4 g/dL (ref 30.0–36.0)
MCV: 99.4 fL (ref 80.0–100.0)
Platelets: 198 10*3/uL (ref 150–400)
RBC: 3.57 MIL/uL — ABNORMAL LOW (ref 3.87–5.11)
RDW: 13.9 % (ref 11.5–15.5)
WBC: 5.7 10*3/uL (ref 4.0–10.5)
nRBC: 0 % (ref 0.0–0.2)

## 2018-05-07 MED ORDER — HYDROXYZINE HCL 10 MG PO TABS: 10.0000 mg | ORAL_TABLET | Freq: Three times a day (TID) | ORAL | 0 refills | Status: DC | PRN

## 2018-05-07 MED ORDER — PREDNISONE 20 MG PO TABS
40.0000 mg | ORAL_TABLET | Freq: Once | ORAL | Status: AC
  Administered 2018-05-07: 40 mg via ORAL
  Filled 2018-05-07: qty 2

## 2018-05-07 MED ORDER — PREDNISONE 20 MG PO TABS: 40.0000 mg | ORAL_TABLET | Freq: Every day | ORAL | 0 refills | Status: DC

## 2018-05-07 NOTE — Discharge Instructions (Addendum)
Thank you for allowing [place] to be part of your care.   Please take hydroxyzine (ATARAX/VISTARIL) 10 MG every six hours as needed for itching   Please take prednisone (DELTASONE) 20 mg - please take two tablets in the morning for three days  Please call your surgeon in the morning to let him know about your rash and that you have been prescribed steroids for Contact Dermatitis.   If you develop swelling of the breast, fever, increase in pain, drainage, nausea or vomiting, please return to the emergency department and call your surgeon.

## 2018-05-07 NOTE — ED Provider Notes (Addendum)
[facility] DEPT Provider Note   CSN: [id number] Arrival date & time: 05/07/18  2106     History   Chief Complaint Chief Complaint  Patient presents with  . Rash    HPI Janet Peterson is a 67 y.o. female with PMH of lumpectomy three days ago presenting with rash on her left breast and surgical site, upper left abdomen, and bilateral flexor surfaces of her arms. She states the rash itches and hurts and topical and oral benadryl have no helped. She denies mouth lesions, nausea, vomiting, changes in BM or urination. She denies fever or chills. She states this has never happened before. She is allergic to latex, augmentin, and sulfa-drugs. Her only recent change in medication besides during surgery was the addition of hydrocodone for breast pain.    HPI  Past Medical History:  Diagnosis Date  . Ankylosing spondylitis ([place])   . Anxiety and depression   . Depression   . Fibromyalgia   . GERD (gastroesophageal reflux disease)   . History of basal cell carcinoma excision    FACE, [place]  . History of hiatal hernia   . History of thrush   . Hyperlipidemia   . Hypertension   . Insomnia   . Left breast mass   . OCD (obsessive compulsive disorder)   . OSA (obstructive sleep apnea)    MILD AND NO CPAP SINCE SURGERY IN 2007  . Osteopenia   . PONV (postoperative nausea and vomiting)   . Psoriatic arthritis ([place])   . PVC (premature ventricular contraction)   . Rheumatoid arthritis [facility])     Patient Active Problem List   Diagnosis Date Noted  . DDD (degenerative disc disease), cervical s/p fusion 11/12/2016  . H/O total knee replacement, left 05/06/2016  . DDD lumbar spine status post fusion 05/06/2016  . Psoriasis 05/05/2016  . High risk medication use 05/05/2016  . Cervical post-laminectomy syndrome 12/09/2015  . Thoracic postlaminectomy syndrome 12/09/2015  . Psoriatic arthritis ([place]) 08/23/2012  . Osteopenia 08/23/2012  . HLD  (hyperlipidemia) 08/23/2012  . RLS (restless legs syndrome) 08/23/2012  . Insomnia 08/23/2012  . Fibromyalgia syndrome 08/12/2012  . Low back pain 08/12/2012  . Syncope   . PVC (premature ventricular contraction)   . OCD (obsessive compulsive disorder)   . GERD (gastroesophageal reflux disease)   . Hypertension     Past Surgical History:  Procedure Laterality Date  . BREAST LUMPECTOMY WITH RADIOACTIVE SEED LOCALIZATION Left 05/04/2018   Procedure: LEFT BREAST LUMPECTOMY WITH RADIOACTIVE SEED LOCALIZATION AND LEFT BREAST NIPPLE BIOPSY;  Surgeon: Jovita Kussmaul, MD;  Location: [place];  Service: General;  Laterality: Left;  . BREAST SURGERY  1975   lumpectomy-- benign  . BUNIONECTOMY  1991  . CATARACT EXTRACTION W/ INTRAOCULAR LENS  IMPLANT, BILATERAL  2000  . CERVICAL FUSION  March 2013   C5 -- C7  . CESAREAN SECTION  1985  . DISTAL INTERPHALANGEAL JOINT FUSION Right 03/14/2015   Procedure: RIGHT LONG FINGER DISTAL INTERPHALANGEAL JOINT ARTHRODESIS;  Surgeon: Iran Planas, MD;  Location: [facility];  Service: Orthopedics;  Laterality: Right;  . EYE SURGERY  1995   rk (laser surgery), semi cornea transplant, detacted retina,  fluid removal  . KNEE ARTHROSCOPY Left 03-03-2004  . POSTERIOR VITRECTOMY RIGHT EYE AND LASER   10-27-1999  . SHOULDER SURGERY Right 1997  . SPINAL FIXATION SURGERY W/ IMPLANT  2013 rod #1//  2014  rod 2  S1 -- T10  (rod #1)//   S1 -- T4 (rod #2)  . THORACIC FUSION  03-13-2013   REMOVAL HARDWARE/  BONE GRAFT FUSION T10//  REVISION OF RODS  . TOTAL KNEE ARTHROPLASTY Left 12-14-2005  . UVULOPALATOPHARYNGOPLASTY  04-26-2006   w/  TONSILLECTOMY/  TURBINATE REDUCTIONS/  BILATERAL ANTERIOR ETHMOIDECTOMY     OB History   No obstetric history on file.      Home Medications    Prior to Admission medications   Medication Sig Start Date End Date Taking? Authorizing Provider  baclofen (LIORESAL) 10 MG tablet Take 10 mg by  mouth 2 (two) times daily. Reported on 12/09/2015    [provider]  buPROPion (WELLBUTRIN XL) 300 MG 24 hr tablet Take 300 mg by mouth every morning. 04/07/17   [provider]  clomiPRAMINE (ANAFRANIL) 75 MG capsule Take 75 mg by mouth 2 (two) times daily. 09/21/16   [provider]  clonazePAM (KLONOPIN) 1 MG tablet TAKE 1 TABLET BY MOUTH EVERY DAY AS NEEDED FOR ANXIETY 03/31/16   [provider]  ezetimibe (ZETIA) 10 MG tablet Take 1 tablet (10 mg total) by mouth daily. 05/12/17   Susy Frizzle, MD  fluticasone (FLONASE) 50 MCG/ACT nasal spray Place 2 sprays into both nostrils daily.    [provider]  Golimumab ([place] ARIA IV) Inject into the vein every 8 (eight) weeks.    [provider]  hydrochlorothiazide (HYDRODIURIL) 25 MG tablet TAKE 1 TABLET BY MOUTH EVERY DAY 04/11/18   Susy Frizzle, MD  HYDROcodone-acetaminophen (NORCO/VICODIN) 5-325 MG tablet Take 1-2 tablets by mouth every 6 (six) hours as needed for moderate pain or severe pain. 05/04/18   [name], MD  hydrOXYzine (ATARAX/VISTARIL) 10 MG tablet Take 1 tablet (10 mg total) by mouth 3 (three) times daily as needed for itching. 05/07/18   Seawell, Jaimie A, DO  leflunomide (ARAVA) 20 MG tablet TAKE 1/2 TABLET BY MOUTH ONCE DAILY 04/18/18   Bo Merino, MD  metoprolol succinate (TOPROL-XL) 50 MG 24 hr tablet TAKE 1 TABLET BY MOUTH EVERY DAY WITH OR IMMEDIATELY FOLLOWING A MEAL 12/01/17   Susy Frizzle, MD  omeprazole (PRILOSEC) 40 MG capsule Take 1 capsule (40 mg total) by mouth daily. 08/26/17   Susy Frizzle, MD  oxyCODONE-acetaminophen (PERCOCET) 5-325 MG tablet Take 1 tablet by mouth every 4 (four) hours as needed. 04/13/18   Susy Frizzle, MD  predniSONE (DELTASONE) 20 MG tablet Take 2 tablets (40 mg total) by mouth daily with breakfast. 05/07/18   Seawell, Jaimie A, DO  sucralfate (CARAFATE) 1 G tablet Take 1 tablet (1 g total) by mouth 4 (four)  times daily -  with meals and at bedtime. 08/17/13   Orlena Sheldon, PA-C  traZODone (DESYREL) 100 MG tablet Take 100 mg by mouth at bedtime.     [provider]  valsartan (DIOVAN) 320 MG tablet Take 1 tablet (320 mg total) by mouth daily. 06/14/17   Susy Frizzle, MD  Vitamin D, Ergocalciferol, (DRISDOL) 50000 units CAPS capsule Take 1 capsule (50,000 Units total) by mouth every 30 (thirty) days. 03/01/18   Bo Merino, MD    Family History Family History  Problem Relation Age of Onset  . Diabetes Mother   . Heart disease Mother   . Diabetes Father   . Anuerysm Father   . Diabetes Brother   . Heart disease Brother   . Diabetes Brother   . Heart disease Brother  Social History Social History   Tobacco Use  . Smoking status: Former Smoker    Packs/day: 0.50    Years: 10.00    Pack years: 5.00    Types: Cigarettes    Last attempt to quit: 03/07/1995    Years since quitting: 23.1  . Smokeless tobacco: Never Used  Substance Use Topics  . Alcohol use: Yes    Comment: rare  . Drug use: Never     Allergies   Latex; Amlodipine besy-benazepril hcl; Augmentin [amoxicillin-pot clavulanate]; Bextra [valdecoxib]; Lipitor [atorvastatin calcium]; Sulfa antibiotics; and Zocor [simvastatin]   Review of Systems Review of Systems Review of systems reviewed and are negative for acute change, except as noted in the HPI.    Physical Exam Updated Vital Signs BP 127/76 (BP Location: Right Arm)   Pulse 74   Temp 98.3 F (36.8 C) (Oral)   Resp 18   Ht 5\' 4"  (1.626 m)   Wt 89.3 kg   SpO2 95%   BMI 33.79 kg/m   Physical Exam Constitutional:      General: She is not in acute distress.    Appearance: She is obese. She is not ill-appearing or diaphoretic.  HENT:     Head: Normocephalic and atraumatic.     Nose: Nose normal.     Mouth/Throat:     Mouth: Mucous membranes are moist.     Pharynx: Oropharynx is clear. No posterior oropharyngeal erythema.  Eyes:      Extraocular Movements: Extraocular movements intact.     Conjunctiva/sclera: Conjunctivae normal.     Pupils: Pupils are equal, round, and reactive to light.  Cardiovascular:     Rate and Rhythm: Normal rate and regular rhythm.     Heart sounds: Normal heart sounds.  Pulmonary:     Effort: Pulmonary effort is normal.     Breath sounds: Normal breath sounds.  Abdominal:     General: Abdomen is flat. There is no distension.     Palpations: Abdomen is soft.     Tenderness: There is no abdominal tenderness.  Musculoskeletal:        General: No swelling.  Skin:    Findings: No rash.     Comments: Erythematous vesicular rash over entire left breast and LUQ of abdomen, flexor surfaces of both arms. Mild rash over right nipple. Mild TTP over lower left breast. Surgical site superior to left nipple clean and dry with overlying rash.    Neurological:     Mental Status: She is alert and oriented to person, place, and time.      ED Treatments / Results  Labs (all labs ordered are listed, but only abnormal results are displayed) Labs Reviewed  CBC - Abnormal; Notable for the following components:      Result Value   RBC 3.57 (*)    Hemoglobin 11.5 (*)    HCT 35.5 (*)    All other components within normal limits  COMPREHENSIVE METABOLIC PANEL - Abnormal; Notable for the following components:   Sodium 132 (*)    Potassium 3.2 (*)    Chloride 97 (*)    Glucose, Bld 122 (*)    Total Protein 6.0 (*)    All other components within normal limits    EKG None  Radiology No results found.  Procedures Procedures (including critical care time)  Medications Ordered in ED Medications  predniSONE (DELTASONE) tablet 40 mg (40 mg Oral Given 05/07/18 2301)     Initial Impression / Assessment and Plan /  ED Course  I have reviewed the triage vital signs and the nursing notes.  Pertinent labs & imaging results that were available during my care of the patient were reviewed by me and  considered in my medical decision making (see chart for details).     67 yo female with latex allergy presents with likely contact dermatitis that appears yesterday, possibly secondary to latex, surgical adhesive or pre-surgical cleaning product. She has no signs of cellulitis, surgical site infection or systemic symptoms, and surgical site is clean and dry. Labs were drawn and are unremarkable. She does have history of shingles but rash does is bilateral and does not have the appearance of shingles. Hydroxyzine for itching. She has completed hydrocodone course and does not have pain. Prednisone prescribed with follow-up with her surgeon tomorrow to discuss that steroid and to determine if she needs evaluation. Patient understands and agreed with plan and is safe for discharge. Strict return precautions given in the case that she does develop infection.      Final Clinical Impressions(s) / ED Diagnoses   Final diagnoses:  Contact dermatitis, unspecified contact dermatitis type, unspecified trigger    ED Discharge Orders         Ordered    predniSONE (DELTASONE) 20 MG tablet  Daily with breakfast     05/07/18 2314    hydrOXYzine (ATARAX/VISTARIL) 10 MG tablet  3 times daily PRN     05/07/18 2314           Seawell, Jaimie A, DO 05/07/18 2359    Seawell, Jaimie A, DO 05/08/18 0001    Quintella Reichert, MD 05/11/18 1123

## 2018-05-07 NOTE — ED Triage Notes (Signed)
Pt reports having lumpectomy of left breast on 05/04/18 and then on 05/06/18 pt reports having itching to left breast and left arm. Pt then states that rash began today. Not currently on chemotherapy.

## 2018-05-13 ENCOUNTER — Telehealth: Payer: Self-pay | Admitting: Pharmacy Technician

## 2018-05-13 NOTE — Telephone Encounter (Signed)
Left message for patient to see if she is expecting any insurance changes for 2020. Patient is receiving infusions that may require a pre-certification.  3:23 PM Beatriz Chancellor, CPhT

## 2018-05-16 NOTE — Telephone Encounter (Signed)
Called the patient to verify benefits for 2020. Pt currently received infusions that may require a pre-certification. Patient states that her insurance plans will not change for 2020. Medicare covers 80% of the infusion and no authorization is required, and the supplement  would cover the 20% of the cost that was not paid for by Medicare as long as Medicare covered the medication.    2:30 PM Beatriz Chancellor, CPhT

## 2018-05-23 ENCOUNTER — Other Ambulatory Visit: Payer: Self-pay | Admitting: Family Medicine

## 2018-05-31 ENCOUNTER — Other Ambulatory Visit: Payer: Self-pay | Admitting: Family Medicine

## 2018-05-31 NOTE — Telephone Encounter (Signed)
Patient requesting a refill on Oxycodone     LOV: 10/15/17  LRF:   04/13/18

## 2018-06-02 MED ORDER — OXYCODONE-ACETAMINOPHEN 5-325 MG PO TABS: 1.0000 | ORAL_TABLET | ORAL | 0 refills | Status: DC | PRN

## 2018-06-29 ENCOUNTER — Other Ambulatory Visit: Payer: Self-pay | Admitting: *Deleted

## 2018-06-29 DIAGNOSIS — L4050 Arthropathic psoriasis, unspecified: Secondary | ICD-10-CM

## 2018-06-30 ENCOUNTER — Ambulatory Visit (HOSPITAL_COMMUNITY)
Admission: RE | Admit: 2018-06-30 | Discharge: 2018-06-30 | Disposition: A | Discharge status: Home / Self Care | Payer: Medicare Other | Source: Ambulatory Visit | Attending: Rheumatology | Admitting: Rheumatology

## 2018-06-30 DIAGNOSIS — L4050 Arthropathic psoriasis, unspecified: Secondary | ICD-10-CM | POA: Insufficient documentation

## 2018-06-30 MED ORDER — SODIUM CHLORIDE 0.9 % IV SOLN
2.0000 mg/kg | INTRAVENOUS | Status: DC
  Administered 2018-06-30: 178.75 mg via INTRAVENOUS
  Filled 2018-06-30: qty 14.3

## 2018-06-30 MED ORDER — ACETAMINOPHEN 325 MG PO TABS: 650.0000 mg | ORAL_TABLET | ORAL | Status: DC

## 2018-06-30 MED ORDER — DIPHENHYDRAMINE HCL 25 MG PO CAPS: 25.0000 mg | ORAL_CAPSULE | ORAL | Status: DC

## 2018-07-12 NOTE — Progress Notes (Signed)
Office Visit Note  Patient: Janet Peterson             Date of Birth: [birth year]           MRN: [id number]             PCP: Susy Frizzle, MD Referring: Susy Frizzle, MD Visit Date: 07/26/2018 Occupation: @GUAROCC @  Subjective:  Right knee joint pain    History of Present Illness: Janet Peterson is a 68 y.o. female with history of psoriatic arthritis, osteoarthritis, DDD, and fibromyalgia.  She receiving Simponi IV infusions every 8 weeks. Her last infusion was on 06/30/18.  She did not feel as though Arava 10 mg was effective and discontinued 2 months ago.  She reports pain in both knee joints.  She states the pain has been more severe since reducing the dose of Arava from 20 mg to 10 mg daily.  She denies any other joint pain or joint swelling.   Activities of Daily Living:  Patient reports morning stiffness for 1  hour.   Patient Denies nocturnal pain.  Difficulty dressing/grooming: Denies Difficulty climbing stairs: Reports Difficulty getting out of chair: Reports Difficulty using hands for taps, buttons, cutlery, and/or writing: Denies  Review of Systems  Constitutional: Positive for fatigue.  HENT: Negative for mouth sores, mouth dryness and nose dryness.   Eyes: Negative for pain, visual disturbance and dryness.  Respiratory: Negative for cough, hemoptysis, shortness of breath and difficulty breathing.   Cardiovascular: Negative for chest pain, palpitations, hypertension and swelling in legs/feet.  Gastrointestinal: Negative for blood in stool, constipation and diarrhea.  Endocrine: Negative for increased urination.  Genitourinary: Negative for painful urination.  Musculoskeletal: Positive for arthralgias, joint pain and morning stiffness. Negative for joint swelling, myalgias, muscle weakness, muscle tenderness and myalgias.  Skin: Negative for color change, pallor, rash, hair loss, nodules/bumps, skin tightness, ulcers and sensitivity to sunlight.    Allergic/Immunologic: Negative for susceptible to infections.  Neurological: Negative for dizziness, numbness, headaches and weakness.  Hematological: Negative for swollen glands.  Psychiatric/Behavioral: Positive for depressed mood. Negative for sleep disturbance. The patient is not nervous/anxious.     PMFS History:  Patient Active Problem List   Diagnosis Date Noted  . DDD (degenerative disc disease), cervical s/p fusion 11/12/2016  . H/O total knee replacement, left 05/06/2016  . DDD lumbar spine status post fusion 05/06/2016  . Psoriasis 05/05/2016  . High risk medication use 05/05/2016  . Cervical post-laminectomy syndrome 12/09/2015  . Thoracic postlaminectomy syndrome 12/09/2015  . Psoriatic arthritis ([place]) 08/23/2012  . Osteopenia 08/23/2012  . HLD (hyperlipidemia) 08/23/2012  . RLS (restless legs syndrome) 08/23/2012  . Insomnia 08/23/2012  . Fibromyalgia syndrome 08/12/2012  . Low back pain 08/12/2012  . Syncope   . PVC (premature ventricular contraction)   . OCD (obsessive compulsive disorder)   . GERD (gastroesophageal reflux disease)   . Hypertension     Past Medical History:  Diagnosis Date  . Ankylosing spondylitis ([place])   . Anxiety and depression   . Depression   . Fibromyalgia   . GERD (gastroesophageal reflux disease)   . History of basal cell carcinoma excision    FACE, [place]  . History of hiatal hernia   . History of thrush   . Hyperlipidemia   . Hypertension   . Insomnia   . Left breast mass   . OCD (obsessive compulsive disorder)   . OSA (obstructive sleep apnea)    MILD  AND NO CPAP SINCE SURGERY IN 2007  . Osteopenia   . PONV (postoperative nausea and vomiting)   . Psoriatic arthritis ([place])   . PVC (premature ventricular contraction)   . Rheumatoid arthritis ([place])     Family History  Problem Relation Age of Onset  . Diabetes Mother   . Heart disease Mother   . Diabetes Father   . Anuerysm Father   . Diabetes Brother   .  Heart disease Brother   . Diabetes Brother   . Heart disease Brother    Past Surgical History:  Procedure Laterality Date  . BREAST LUMPECTOMY WITH RADIOACTIVE SEED LOCALIZATION Left 05/04/2018   Procedure: LEFT BREAST LUMPECTOMY WITH RADIOACTIVE SEED LOCALIZATION AND LEFT BREAST NIPPLE BIOPSY;  Surgeon: Jovita Kussmaul, MD;  Location: [place];  Service: General;  Laterality: Left;  . BREAST SURGERY  1975   lumpectomy-- benign  . BUNIONECTOMY  1991  . CATARACT EXTRACTION W/ INTRAOCULAR LENS  IMPLANT, BILATERAL  2000  . CERVICAL FUSION  March 2013   C5 -- C7  . CESAREAN SECTION  1985  . DISTAL INTERPHALANGEAL JOINT FUSION Right 03/14/2015   Procedure: RIGHT LONG FINGER DISTAL INTERPHALANGEAL JOINT ARTHRODESIS;  Surgeon: Iran Planas, MD;  Location: [place];  Service: Orthopedics;  Laterality: Right;  . EYE SURGERY  1995   rk (laser surgery), semi cornea transplant, detacted retina,  fluid removal  . KNEE ARTHROSCOPY Left 03-03-2004  . POSTERIOR VITRECTOMY RIGHT EYE AND LASER   10-27-1999  . SHOULDER SURGERY Right 1997  . SPINAL FIXATION SURGERY W/ IMPLANT  2013 rod #1//  2014  rod 2   S1 -- T10  (rod #1)//   S1 -- T4 (rod #2)  . THORACIC FUSION  03-13-2013   REMOVAL HARDWARE/  BONE GRAFT FUSION T10//  REVISION OF RODS  . TOTAL KNEE ARTHROPLASTY Left 12-14-2005  . UVULOPALATOPHARYNGOPLASTY  04-26-2006   w/  TONSILLECTOMY/  TURBINATE REDUCTIONS/  BILATERAL ANTERIOR ETHMOIDECTOMY   Social History   Social History Narrative  . Not on file   Immunization History  Administered Date(s) Administered  . Influenza Whole 03/04/2012  . Influenza, High Dose Seasonal PF 03/25/2018  . Influenza,inj,Quad PF,6+ Mos 02/28/2013, 03/07/2014, 02/28/2015, 03/27/2016, 03/09/2017  . Pneumococcal Conjugate-13 09/10/2016  . Pneumococcal Polysaccharide-23 03/12/2006, 11/09/2011     Objective: Vital Signs: There were no vitals taken for this visit.   Physical  Exam Vitals signs and nursing note reviewed.  Constitutional:      Appearance: She is well-developed.  HENT:     Head: Normocephalic and atraumatic.  Eyes:     Conjunctiva/sclera: Conjunctivae normal.  Neck:     Musculoskeletal: Normal range of motion.  Cardiovascular:     Rate and Rhythm: Normal rate and regular rhythm.     Heart sounds: Normal heart sounds.  Pulmonary:     Effort: Pulmonary effort is normal.     Breath sounds: Normal breath sounds.  Abdominal:     General: Bowel sounds are normal.     Palpations: Abdomen is soft.  Lymphadenopathy:     Cervical: No cervical adenopathy.  Skin:    General: Skin is warm and dry.     Capillary Refill: Capillary refill takes less than 2 seconds.  Neurological:     Mental Status: She is alert and oriented to person, place, and time.  Psychiatric:        Behavior: Behavior normal.      Musculoskeletal Exam: C-spine, thoracic spine,  and lumbar spine good ROM.  No midline spinal tenderness.  No SI joint tenderness.  Shoulder joints, elbow joints, wrist joints, MCPs, PIPs ,and DIPs good ROM with no synovitis.  She has PIP and DIP synovial thickening.  Hip joints good ROM with no discomfort.  Right knee discomfort but no warmth or effusion noted.  Left knee replacement good ROM with no warmth or effusion.   CDAI Exam: CDAI Score: Not documented Patient Global Assessment: Not documented; Provider Global Assessment: Not documented Swollen: Not documented; Tender: Not documented Joint Exam   Not documented   There is currently no information documented on the homunculus. Go to the Rheumatology activity and complete the homunculus joint exam.  Investigation: No additional findings.  Imaging: Dg Chest 2 View  Result Date: 07/20/2018 CLINICAL DATA:  Shortness of breath and dry cough for 2 weeks. EXAM: CHEST - 2 VIEW COMPARISON:  09/25/2015 FINDINGS: The cardiac silhouette, mediastinal and hilar contours are within normal limits. The  lungs are clear of an acute process. No pleural effusion. Stable thoracolumbar fusion hardware with remote T10 vertebral body fracture. IMPRESSION: No acute cardiopulmonary findings. Electronically Signed   By: Marijo Sanes M.D.   On: 07/20/2018 13:38    Recent Labs: Lab Results  Component Value Date   WBC 5.7 05/07/2018   HGB 11.5 (L) 05/07/2018   PLT 198 05/07/2018   NA 132 (L) 05/07/2018   K 3.2 (L) 05/07/2018   CL 97 (L) 05/07/2018   CO2 27 05/07/2018   GLUCOSE 122 (H) 05/07/2018   BUN 9 05/07/2018   CREATININE 0.72 05/07/2018   BILITOT 0.3 05/07/2018   ALKPHOS 72 05/07/2018   AST 21 05/07/2018   ALT 16 05/07/2018   PROT 6.0 (L) 05/07/2018   ALBUMIN 3.6 05/07/2018   CALCIUM 9.0 05/07/2018   GFRAA >60 05/07/2018   QFTBGOLD Indeterminate 08/14/2016   QFTBGOLDPLUS Negative 10/14/2017    Speciality Comments: Simponi [place] every 8 weeks started Jan 2018  TB gold negative 08/19/16  Procedures:  No procedures performed Allergies: Latex; Amlodipine besy-benazepril hcl; Augmentin [amoxicillin-pot clavulanate]; Bextra [valdecoxib]; Lipitor [atorvastatin calcium]; Sulfa antibiotics; and Zocor [simvastatin]   Assessment / Plan:     Visit Diagnoses: Psoriatic arthritis ([place]): She has no synovitis or dactylitis on exam.  She has not had any recent psoriatic arthritis flares.  She is experiencing pain in the right knee joint but no warmth or effusion was noted.  X-rays of the right knee were obtained today that revealed moderate to severe OA.  She declined a cortisone injection.  She has no other joint pain or joint swelling.  No SI joint tenderness.  No achilles tendonitis or plantar fasciitis.  No psoriasis at this time. She is clinically doing well on Simpnoi infusions every 8 weeks.  Her last infusion was on 06/30/2018.  Her next infusion is scheduled for 08/25/2018.  Future orders  for CBC, CMP, and TB gold are placed today.She was previously taking Arava 10 mg by mouth daily but did not find  the reduced dose to be effective and discontinued about 2 months ago.  Since she is clinically been doing well off of [place] we discussed continuing to hold [place] at this time.  If she develops increased joint pain or joint swelling she will notify us.  She will follow-up in the office in 5 months.  Psoriasis: She has no psoriasis at this time.   High risk medication use -Simponi IV every 8 weeks (last infusion 06/30/2018) and Arava 20  mg half tablet daily.  Last TB gold negative on 10/14/2017.  Most recent CBC/CMP from hospital encounter showed creatinine and liver enzymes within normal limits and hemoglobin stable on 05/07/2018.    Will monitor CBC/CMP with each infusion. Plan: QuantiFERON-TB Gold Plus, CBC with Differential/Platelet, COMPLETE METABOLIC PANEL WITH GFR  Primary osteoarthritis of both hands: She has PIP and DIP synovial thickening consistent with osteoarthritis of both hands.  No synovitis.  Complete fist formation bilaterally.  Joint protection and muscle strengthening consistent with osteoarthritis of both knees.    H/O total knee replacement, left: Dr. Wynelle Link. Chronic pain.  No warmth or effusion.  She has good range of motion.  Primary osteoarthritis of right knee: She has been experiencing increased discomfort in the right knee joint.  She reports that the pain is worse with range of motion and exercises.  She has no pain at night.  She has no pain at rest at this time.  X-rays of the right knee were obtained today that revealed moderate to severe osteoarthritis.  She declined a cortisone injection.  We discussed that if her pain persist she can schedule a cortisone injection.  We also discussed Vis[place] gel injections in the future.  She was encouraged to use Voltaren gel topically PRN for pain relief.  Chronic pain of right knee -She has been having increased pain in the right knee joint.  No warmth or effusion noted. She has good ROM.  X-rays of right knee were obtained today, which  revealed moderate to severe OA.  She declined a cortisone injection at this time. She will use voltaren gel PRN.  Plan: XR KNEE 3 VIEW RIGHT.    DDD (degenerative disc disease), cervical s/p fusion:  She has good ROM with no discomfort at this time.   DDD (degenerative disc disease), thoracic: She has no discomfort at this time.   DDD lumbar spine status post fusion: She has no discomfort at this time.  Fibromyalgia: She has generalized muscle aches and muscle tenderness due to fibromyalgia.  She has chronic fatigue and insomnia.  The importance of regular exercise and good sleep hygiene were discussed.   Other fatigue: She as chronic fatigue related to insomnia.   Primary insomnia: She has interrupted sleep at night.   Other medical conditions are listed as follows:   RLS (restless legs syndrome)  History of vitamin D deficiency  History of hypertension  History of hyperlipidemia  History of gastroesophageal reflux (GERD)  History of OCD (obsessive compulsive disorder)  History of migraine    Orders: Orders Placed This Encounter  Procedures  . XR KNEE 3 VIEW RIGHT  . QuantiFERON-TB Gold Plus  . CBC with Differential/Platelet  . COMPLETE METABOLIC PANEL WITH GFR   No orders of the defined types were placed in this encounter.   Face-to-face time spent with patient was 30 minutes. Greater than 50% of time was spent in counseling and coordination of care.  Follow-Up Instructions: Return in about 5 months (around 12/26/2018) for Psoriatic arthritis, Osteoarthritis, Fibromyalgia, DDD.   Ofilia Neas, PA-C   I examined and evaluated the patient with Hazel Sams PA. The plan of care was discussed as noted above.  Bo Merino, MD  Note - This record has been created using Editor, commissioning.  Chart creation errors have been sought, but may not always  have been located. Such creation errors do not reflect on  the standard of medical care.

## 2018-07-14 ENCOUNTER — Encounter: Payer: Self-pay | Admitting: Family Medicine

## 2018-07-14 ENCOUNTER — Ambulatory Visit (INDEPENDENT_AMBULATORY_CARE_PROVIDER_SITE_OTHER): Payer: Medicare Other | Admitting: Family Medicine

## 2018-07-14 VITALS — BP 136/82 | HR 78 | Temp 98.4°F | Resp 18 | Ht 64.0 in | Wt 199.0 lb

## 2018-07-14 DIAGNOSIS — J069 Acute upper respiratory infection, unspecified: Secondary | ICD-10-CM | POA: Diagnosis not present

## 2018-07-14 DIAGNOSIS — R0602 Shortness of breath: Secondary | ICD-10-CM | POA: Diagnosis not present

## 2018-07-14 MED ORDER — SUCRALFATE 1 G PO TABS: 1.0000 g | ORAL_TABLET | Freq: Three times a day (TID) | ORAL | 3 refills | Status: DC

## 2018-07-14 NOTE — Progress Notes (Signed)
Subjective:    Patient ID: Janet Peterson, female    DOB: [birth year], 68 y.o.   MRN: [id number]  HPI Symptoms began Sunday.  Symptoms include cough.  Cough is mainly nonproductive.  She does occasionally have some clear mucus.  She reports a scratchy throat.  She reports some head congestion.  She reports subjective fevers.  She reports feeling mildly short of breath.  She denies any pleurisy.  She denies any hemoptysis.  She states that she is been short of breath for several months.  This is just made it worse.  She states that she gets easily winded with minimal activity.  She denies any chest pain.  She denies any angina.  She denies any pleurisy or hemoptysis. Past Medical History:  Diagnosis Date  . Ankylosing spondylitis ([place])   . Anxiety and depression   . Depression   . Fibromyalgia   . GERD (gastroesophageal reflux disease)   . History of basal cell carcinoma excision    FACE, [place]  . History of hiatal hernia   . History of thrush   . Hyperlipidemia   . Hypertension   . Insomnia   . Left breast mass   . OCD (obsessive compulsive disorder)   . OSA (obstructive sleep apnea)    MILD AND NO CPAP SINCE SURGERY IN 2007  . Osteopenia   . PONV (postoperative nausea and vomiting)   . Psoriatic arthritis ([place])   . PVC (premature ventricular contraction)   . Rheumatoid arthritis [facility])    Past Surgical History:  Procedure Laterality Date  . BREAST LUMPECTOMY WITH RADIOACTIVE SEED LOCALIZATION Left 05/04/2018   Procedure: LEFT BREAST LUMPECTOMY WITH RADIOACTIVE SEED LOCALIZATION AND LEFT BREAST NIPPLE BIOPSY;  Surgeon: Jovita Kussmaul, MD;  Location: [place];  Service: General;  Laterality: Left;  . BREAST SURGERY  1975   lumpectomy-- benign  . BUNIONECTOMY  1991  . CATARACT EXTRACTION W/ INTRAOCULAR LENS  IMPLANT, BILATERAL  2000  . CERVICAL FUSION  March 2013   C5 -- C7  . CESAREAN SECTION  1985  . DISTAL INTERPHALANGEAL JOINT FUSION Right  03/14/2015   Procedure: RIGHT LONG FINGER DISTAL INTERPHALANGEAL JOINT ARTHRODESIS;  Surgeon: Iran Planas, MD;  Location: [place];  Service: Orthopedics;  Laterality: Right;  . EYE SURGERY  1995   rk (laser surgery), semi cornea transplant, detacted retina,  fluid removal  . KNEE ARTHROSCOPY Left 03-03-2004  . POSTERIOR VITRECTOMY RIGHT EYE AND LASER   10-27-1999  . SHOULDER SURGERY Right 1997  . SPINAL FIXATION SURGERY W/ IMPLANT  2013 rod #1//  2014  rod 2   S1 -- T10  (rod #1)//   S1 -- T4 (rod #2)  . THORACIC FUSION  03-13-2013   REMOVAL HARDWARE/  BONE GRAFT FUSION T10//  REVISION OF RODS  . TOTAL KNEE ARTHROPLASTY Left 12-14-2005  . UVULOPALATOPHARYNGOPLASTY  04-26-2006   w/  TONSILLECTOMY/  TURBINATE REDUCTIONS/  BILATERAL ANTERIOR ETHMOIDECTOMY   Current Outpatient Medications on File Prior to Visit  Medication Sig Dispense Refill  . baclofen (LIORESAL) 10 MG tablet Take 10 mg by mouth 2 (two) times daily. Reported on 12/09/2015    . buPROPion (WELLBUTRIN XL) 300 MG 24 hr tablet Take 300 mg by mouth every morning.  1  . clomiPRAMINE (ANAFRANIL) 75 MG capsule Take 75 mg by mouth 2 (two) times daily.  2  . clonazePAM (KLONOPIN) 1 MG tablet TAKE 1 TABLET BY MOUTH EVERY DAY  AS NEEDED FOR ANXIETY  1  . ezetimibe (ZETIA) 10 MG tablet Take 1 tablet (10 mg total) by mouth daily. 90 tablet 3  . fluticasone (FLONASE) 50 MCG/ACT nasal spray Place 2 sprays into both nostrils daily.    . Golimumab ([place] ARIA IV) Inject into the vein every 8 (eight) weeks.    . hydrochlorothiazide (HYDRODIURIL) 25 MG tablet TAKE 1 TABLET BY MOUTH EVERY DAY 90 tablet 3  . HYDROcodone-acetaminophen (NORCO/VICODIN) 5-325 MG tablet Take 1-2 tablets by mouth every 6 (six) hours as needed for moderate pain or severe pain. 10 tablet 0  . hydrOXYzine (ATARAX/VISTARIL) 10 MG tablet Take 1 tablet (10 mg total) by mouth 3 (three) times daily as needed for itching. 12 tablet 0  . leflunomide (ARAVA) 20  MG tablet TAKE 1/2 TABLET BY MOUTH ONCE DAILY 45 tablet 0  . metoprolol succinate (TOPROL-XL) 50 MG 24 hr tablet TAKE 1 TABLET BY MOUTH EVERY DAY WITH OR IMMEDIATELY FOLLOWING A MEAL 90 tablet 3  . omeprazole (PRILOSEC) 40 MG capsule Take 1 capsule (40 mg total) by mouth daily. 90 capsule 3  . oxyCODONE-acetaminophen (PERCOCET) 5-325 MG tablet Take 1 tablet by mouth every 4 (four) hours as needed. 60 tablet 0  . predniSONE (DELTASONE) 20 MG tablet Take 2 tablets (40 mg total) by mouth daily with breakfast. 6 tablet 0  . sucralfate (CARAFATE) 1 G tablet Take 1 tablet (1 g total) by mouth 4 (four) times daily -  with meals and at bedtime. 120 tablet 3  . traZODone (DESYREL) 100 MG tablet Take 100 mg by mouth at bedtime.     . valsartan (DIOVAN) 320 MG tablet TAKE 1 TABLET BY MOUTH EVERY DAY 90 tablet 3  . Vitamin D, Ergocalciferol, (DRISDOL) 50000 units CAPS capsule Take 1 capsule (50,000 Units total) by mouth every 30 (thirty) days. 3 capsule 1   No current facility-administered medications on file prior to visit.    Allergies  Allergen Reactions  . Latex Rash    And Mouth sores  . Amlodipine Besy-Benazepril Hcl Other (See Comments)    COUGH  . Augmentin [Amoxicillin-Pot Clavulanate] Diarrhea and Nausea And Vomiting  . Bextra [Valdecoxib] Diarrhea and Nausea And Vomiting    REFLUX  . Lipitor [Atorvastatin Calcium] Other (See Comments)    MYALGIAS  . Sulfa Antibiotics Nausea And Vomiting    CRAMPING  . Zocor [Simvastatin] Other (See Comments)    MYALGIA   Social History   Socioeconomic History  . Marital status: Married    Spouse name: Not on file  . Number of children: Not on file  . Years of education: Not on file  . Highest education level: Not on file  Occupational History  . Not on file  Social Needs  . Financial resource strain: Not on file  . Food insecurity:    Worry: Not on file    Inability: Not on file  . Transportation needs:    Medical: Not on file     Non-medical: Not on file  Tobacco Use  . Smoking status: Former Smoker    Packs/day: 0.50    Years: 10.00    Pack years: 5.00    Types: Cigarettes    Last attempt to quit: 03/07/1995    Years since quitting: 23.3  . Smokeless tobacco: Never Used  Substance and Sexual Activity  . Alcohol use: Yes    Comment: rare  . Drug use: Never  . Sexual activity: Not on file  Lifestyle  .  Physical activity:    Days per week: Not on file    Minutes per session: Not on file  . Stress: Not on file  Relationships  . Social connections:    Talks on phone: Not on file    Gets together: Not on file    Attends religious service: Not on file    Active member of club or organization: Not on file    Attends meetings of clubs or organizations: Not on file    Relationship status: Not on file  . Intimate partner violence:    Fear of current or ex partner: Not on file    Emotionally abused: Not on file    Physically abused: Not on file    Forced sexual activity: Not on file  Other Topics Concern  . Not on file  Social History Narrative  . Not on file      Review of Systems  All other systems reviewed and are negative.      Objective:   Physical Exam  Constitutional: She appears well-developed and well-nourished. No distress.  Neck: Neck supple.  Cardiovascular: Normal rate, regular rhythm and normal heart sounds.  No murmur heard. Pulmonary/Chest: Effort normal and breath sounds normal. No respiratory distress. She has no wheezes. She has no rales.  Musculoskeletal:        General: No edema.  Skin: She is not diaphoretic.  Vitals reviewed.         Assessment & Plan:  Shortness of breath - Plan: DG Chest 2 View, ECHOCARDIOGRAM COMPLETE  URI, acute  Patient's exam is completely normal.  I suspect that she has a viral upper respiratory infection.  I recommended Coricidin HBP for symptomatic relief.  Symptoms should gradually improve over the next 4 to 5 days.  However she also  reports several months of shortness of breath and dyspnea on exertion.  I will start the work-up with a chest x-ray as well as an echocardiogram.  If these tests are normal, consider a stress test as well as pulmonary function test to evaluate further.

## 2018-07-19 ENCOUNTER — Ambulatory Visit
Admission: RE | Admit: 2018-07-19 | Discharge: 2018-07-19 | Disposition: A | Discharge status: Home / Self Care | Payer: Medicare Other | Source: Ambulatory Visit | Attending: Family Medicine | Admitting: Family Medicine

## 2018-07-19 ENCOUNTER — Ambulatory Visit (HOSPITAL_COMMUNITY): Payer: Medicare Other | Attending: Cardiovascular Disease

## 2018-07-19 DIAGNOSIS — R0602 Shortness of breath: Secondary | ICD-10-CM | POA: Diagnosis not present

## 2018-07-19 MED ORDER — PERFLUTREN LIPID MICROSPHERE
1.0000 mL | INTRAVENOUS | Status: AC | PRN
  Administered 2018-07-19: 2 mL via INTRAVENOUS

## 2018-07-20 ENCOUNTER — Other Ambulatory Visit: Payer: Self-pay | Admitting: Family Medicine

## 2018-07-20 NOTE — Telephone Encounter (Signed)
Patient requesting a refill on Oxycodone     LOV: 07/14/18  LRF:       06/02/18

## 2018-07-21 MED ORDER — OXYCODONE-ACETAMINOPHEN 5-325 MG PO TABS: 1.0000 | ORAL_TABLET | ORAL | 0 refills | Status: DC | PRN

## 2018-07-22 ENCOUNTER — Other Ambulatory Visit: Payer: Self-pay | Admitting: *Deleted

## 2018-07-22 MED ORDER — OMEPRAZOLE 40 MG PO CPDR: 40.0000 mg | DELAYED_RELEASE_CAPSULE | Freq: Every day | ORAL | 3 refills | Status: DC

## 2018-07-25 ENCOUNTER — Other Ambulatory Visit: Payer: Self-pay | Admitting: *Deleted

## 2018-07-25 NOTE — Progress Notes (Signed)
Infusion orders are current for patient CBC CMP Tylenol Benadryl appointments are up to date and follow up appointment  is scheduled TB gold not due yet.  

## 2018-07-26 ENCOUNTER — Ambulatory Visit (INDEPENDENT_AMBULATORY_CARE_PROVIDER_SITE_OTHER): Payer: Medicare Other | Admitting: Physician Assistant

## 2018-07-26 ENCOUNTER — Encounter: Payer: Self-pay | Admitting: Physician Assistant

## 2018-07-26 ENCOUNTER — Ambulatory Visit (INDEPENDENT_AMBULATORY_CARE_PROVIDER_SITE_OTHER): Payer: Medicare Other

## 2018-07-26 VITALS — BP 140/87 | HR 77 | Resp 14 | Ht 64.0 in | Wt 199.0 lb

## 2018-07-26 DIAGNOSIS — L409 Psoriasis, unspecified: Secondary | ICD-10-CM | POA: Diagnosis not present

## 2018-07-26 DIAGNOSIS — M5134 Other intervertebral disc degeneration, thoracic region: Secondary | ICD-10-CM

## 2018-07-26 DIAGNOSIS — G8929 Other chronic pain: Secondary | ICD-10-CM

## 2018-07-26 DIAGNOSIS — M19041 Primary osteoarthritis, right hand: Secondary | ICD-10-CM | POA: Diagnosis not present

## 2018-07-26 DIAGNOSIS — M47816 Spondylosis without myelopathy or radiculopathy, lumbar region: Secondary | ICD-10-CM

## 2018-07-26 DIAGNOSIS — M5030 Other cervical disc degeneration, unspecified cervical region: Secondary | ICD-10-CM

## 2018-07-26 DIAGNOSIS — G2581 Restless legs syndrome: Secondary | ICD-10-CM

## 2018-07-26 DIAGNOSIS — M25561 Pain in right knee: Secondary | ICD-10-CM

## 2018-07-26 DIAGNOSIS — M1711 Unilateral primary osteoarthritis, right knee: Secondary | ICD-10-CM

## 2018-07-26 DIAGNOSIS — Z8669 Personal history of other diseases of the nervous system and sense organs: Secondary | ICD-10-CM

## 2018-07-26 DIAGNOSIS — M19042 Primary osteoarthritis, left hand: Secondary | ICD-10-CM

## 2018-07-26 DIAGNOSIS — Z8639 Personal history of other endocrine, nutritional and metabolic disease: Secondary | ICD-10-CM

## 2018-07-26 DIAGNOSIS — Z8659 Personal history of other mental and behavioral disorders: Secondary | ICD-10-CM

## 2018-07-26 DIAGNOSIS — F5101 Primary insomnia: Secondary | ICD-10-CM

## 2018-07-26 DIAGNOSIS — R5383 Other fatigue: Secondary | ICD-10-CM

## 2018-07-26 DIAGNOSIS — Z8679 Personal history of other diseases of the circulatory system: Secondary | ICD-10-CM

## 2018-07-26 DIAGNOSIS — Z96652 Presence of left artificial knee joint: Secondary | ICD-10-CM

## 2018-07-26 DIAGNOSIS — Z8719 Personal history of other diseases of the digestive system: Secondary | ICD-10-CM

## 2018-07-26 DIAGNOSIS — L4050 Arthropathic psoriasis, unspecified: Secondary | ICD-10-CM

## 2018-07-26 DIAGNOSIS — M797 Fibromyalgia: Secondary | ICD-10-CM

## 2018-07-26 DIAGNOSIS — Z79899 Other long term (current) drug therapy: Secondary | ICD-10-CM | POA: Diagnosis not present

## 2018-08-25 ENCOUNTER — Ambulatory Visit (HOSPITAL_COMMUNITY)
Admission: RE | Admit: 2018-08-25 | Discharge: 2018-08-25 | Disposition: A | Discharge status: Home / Self Care | Payer: Medicare Other | Source: Ambulatory Visit | Attending: Rheumatology | Admitting: Rheumatology

## 2018-08-25 ENCOUNTER — Other Ambulatory Visit (HOSPITAL_COMMUNITY): Payer: Self-pay | Admitting: *Deleted

## 2018-08-25 ENCOUNTER — Other Ambulatory Visit: Payer: Self-pay

## 2018-08-25 DIAGNOSIS — L4050 Arthropathic psoriasis, unspecified: Secondary | ICD-10-CM | POA: Diagnosis present

## 2018-08-25 LAB — COMPREHENSIVE METABOLIC PANEL
ALT: 12 U/L (ref 0–44)
AST: 20 U/L (ref 15–41)
Albumin: 3.7 g/dL (ref 3.5–5.0)
Alkaline Phosphatase: 66 U/L (ref 38–126)
Anion gap: 8 (ref 5–15)
BUN: 7 mg/dL — ABNORMAL LOW (ref 8–23)
CO2: 28 mmol/L (ref 22–32)
Calcium: 9.8 mg/dL (ref 8.9–10.3)
Chloride: 94 mmol/L — ABNORMAL LOW (ref 98–111)
Creatinine, Ser: 0.77 mg/dL (ref 0.44–1.00)
GFR calc Af Amer: >60 mL/min (ref >60)
GFR calc non Af Amer: >60 mL/min (ref >60)
Glucose, Bld: 134 mg/dL — ABNORMAL HIGH (ref 70–99)
Potassium: 3.2 mmol/L — ABNORMAL LOW (ref 3.5–5.1)
Sodium: 130 mmol/L — ABNORMAL LOW (ref 135–145)
Total Bilirubin: 0.6 mg/dL (ref 0.3–1.2)
Total Protein: 6 g/dL — ABNORMAL LOW (ref 6.5–8.1)

## 2018-08-25 LAB — CBC WITH DIFFERENTIAL/PLATELET
Abs Immature Granulocytes: 0.01 10*3/uL (ref 0.00–0.07)
Basophils Absolute: 0 10*3/uL (ref 0.0–0.1)
Basophils Relative: 1 %
Eosinophils Absolute: 0.1 10*3/uL (ref 0.0–0.5)
Eosinophils Relative: 2 %
HCT: 36.2 % (ref 36.0–46.0)
Hemoglobin: 12.2 g/dL (ref 12.0–15.0)
Immature Granulocytes: 0 %
Lymphocytes Relative: 44 %
Lymphs Abs: 2 10*3/uL (ref 0.7–4.0)
MCH: 32 pg (ref 26.0–34.0)
MCHC: 33.7 g/dL (ref 30.0–36.0)
MCV: 95 fL (ref 80.0–100.0)
Monocytes Absolute: 0.4 10*3/uL (ref 0.1–1.0)
Monocytes Relative: 8 %
Neutro Abs: 2 10*3/uL (ref 1.7–7.7)
Neutrophils Relative %: 45 %
Platelets: 219 10*3/uL (ref 150–400)
RBC: 3.81 MIL/uL — ABNORMAL LOW (ref 3.87–5.11)
RDW: 13.4 % (ref 11.5–15.5)
WBC: 4.4 10*3/uL (ref 4.0–10.5)
nRBC: 0 % (ref 0.0–0.2)

## 2018-08-25 MED ORDER — DIPHENHYDRAMINE HCL 25 MG PO CAPS
25.0000 mg | ORAL_CAPSULE | ORAL | Status: DC
  Administered 2018-08-25: 25 mg via ORAL

## 2018-08-25 MED ORDER — DIPHENHYDRAMINE HCL 25 MG PO CAPS
ORAL_CAPSULE | ORAL | Status: AC
  Filled 2018-08-25: qty 1

## 2018-08-25 MED ORDER — ACETAMINOPHEN 325 MG PO TABS
650.0000 mg | ORAL_TABLET | ORAL | Status: DC
  Administered 2018-08-25: 650 mg via ORAL

## 2018-08-25 MED ORDER — SODIUM CHLORIDE 0.9 % IV SOLN
2.0000 mg/kg | INTRAVENOUS | Status: AC
  Administered 2018-08-25: 181.25 mg via INTRAVENOUS
  Filled 2018-08-25: qty 14.5

## 2018-08-25 MED ORDER — ACETAMINOPHEN 325 MG PO TABS
ORAL_TABLET | ORAL | Status: AC
  Filled 2018-08-25: qty 2

## 2018-08-25 NOTE — Progress Notes (Signed)
Potassium is low.  Please notify patient and advised her to contact her PCP.  Please forward her labs to her PCP.  Rest of the labs are stable.

## 2018-08-31 ENCOUNTER — Other Ambulatory Visit: Payer: Self-pay | Admitting: Rheumatology

## 2018-08-31 NOTE — Telephone Encounter (Signed)
Last Visit: 07/26/18 Next Visit: 12/27/18  Okay to refill per Dr. Estanislado Pandy

## 2018-09-01 ENCOUNTER — Telehealth: Payer: Self-pay | Admitting: Family Medicine

## 2018-09-01 NOTE — Telephone Encounter (Signed)
Patient calling to discuss recent lab work done, and being low on potassium  Please call her back at [phone]

## 2018-09-05 NOTE — Telephone Encounter (Signed)
Pt called and she was wondering about her potassium as Dr. Marjory Lies said it was low.   It is 3.2 which is the same as last time and we did not treat that at that time.   Pt just wanted to make sure she did not need to do anything.

## 2018-09-06 NOTE — Telephone Encounter (Signed)
Likely due to hctz.  Add kdur 20 meq poqday for 5 days and switch hctz to maxzide 37.5/25 poqday permanently and recheck bmp in 2 weeks.

## 2018-09-08 ENCOUNTER — Other Ambulatory Visit: Payer: Self-pay | Admitting: Family Medicine

## 2018-09-08 DIAGNOSIS — E876 Hypokalemia: Secondary | ICD-10-CM

## 2018-09-08 MED ORDER — TRIAMTERENE-HCTZ 37.5-25 MG PO TABS: 1.0000 | ORAL_TABLET | Freq: Every day | ORAL | 3 refills | Status: DC

## 2018-09-08 MED ORDER — OXYCODONE-ACETAMINOPHEN 5-325 MG PO TABS: 1.0000 | ORAL_TABLET | ORAL | 0 refills | Status: DC | PRN

## 2018-09-08 MED ORDER — POTASSIUM CHLORIDE CRYS ER 20 MEQ PO TBCR: 20.0000 meq | EXTENDED_RELEASE_TABLET | Freq: Every day | ORAL | 0 refills | Status: DC

## 2018-09-08 NOTE — Telephone Encounter (Signed)
Patient requesting a refill on Oxycodone     LOV: 07/14/18  LRF:  07/21/18

## 2018-09-08 NOTE — Telephone Encounter (Signed)
Patient aware of providers recommendations and med sent to pharm 

## 2018-09-23 ENCOUNTER — Other Ambulatory Visit: Payer: Medicare Other

## 2018-09-23 ENCOUNTER — Other Ambulatory Visit: Payer: Self-pay

## 2018-09-23 DIAGNOSIS — E876 Hypokalemia: Secondary | ICD-10-CM

## 2018-09-24 LAB — BASIC METABOLIC PANEL
BUN: 11 mg/dL (ref 7–25)
CO2: 27 mmol/L (ref 20–32)
Calcium: 10.1 mg/dL (ref 8.6–10.4)
Chloride: 94 mmol/L — ABNORMAL LOW (ref 98–110)
Creat: 0.9 mg/dL (ref 0.50–0.99)
Glucose, Bld: 128 mg/dL — ABNORMAL HIGH (ref 65–99)
Potassium: 4.3 mmol/L (ref 3.5–5.3)
Sodium: 131 mmol/L — ABNORMAL LOW (ref 135–146)

## 2018-09-28 ENCOUNTER — Telehealth: Payer: Self-pay | Admitting: Family Medicine

## 2018-09-28 NOTE — Telephone Encounter (Signed)
Pt called and states that since starting the traim/hctz she has been having some side effects and since stopped taking the med and they went away. She was having a lot more heart burn, nausea and just not feeling well. She wanted to know if we could substitute for something else?

## 2018-09-29 MED ORDER — AMLODIPINE BESYLATE 5 MG PO TABS: 5.0000 mg | ORAL_TABLET | Freq: Every day | ORAL | 3 refills | Status: DC

## 2018-09-29 NOTE — Telephone Encounter (Signed)
Pt aware and med sent to pharm 

## 2018-09-29 NOTE — Telephone Encounter (Signed)
She could use amlodipine 5 mg a day.

## 2018-09-30 ENCOUNTER — Other Ambulatory Visit: Payer: Self-pay | Admitting: Family Medicine

## 2018-10-19 ENCOUNTER — Other Ambulatory Visit: Payer: Self-pay

## 2018-10-20 ENCOUNTER — Other Ambulatory Visit: Payer: Self-pay | Admitting: Family Medicine

## 2018-10-20 ENCOUNTER — Ambulatory Visit (HOSPITAL_COMMUNITY)
Admission: RE | Admit: 2018-10-20 | Discharge: 2018-10-20 | Disposition: A | Discharge status: Home / Self Care | Payer: Medicare Other | Source: Ambulatory Visit | Attending: Rheumatology | Admitting: Rheumatology

## 2018-10-20 DIAGNOSIS — L4050 Arthropathic psoriasis, unspecified: Secondary | ICD-10-CM | POA: Insufficient documentation

## 2018-10-20 MED ORDER — SODIUM CHLORIDE 0.9 % IV SOLN
2.0000 mg/kg | INTRAVENOUS | Status: AC
  Administered 2018-10-20: 181.25 mg via INTRAVENOUS
  Filled 2018-10-20: qty 14.5

## 2018-10-20 MED ORDER — DIPHENHYDRAMINE HCL 25 MG PO CAPS: 25.0000 mg | ORAL_CAPSULE | ORAL | Status: DC

## 2018-10-20 MED ORDER — ACETAMINOPHEN 325 MG PO TABS: 650.0000 mg | ORAL_TABLET | ORAL | Status: DC

## 2018-10-20 NOTE — Telephone Encounter (Signed)
Patient requesting a refill on Oxycodone     LOV:  07/14/18  LRF:  09/08/18

## 2018-10-21 MED ORDER — OXYCODONE-ACETAMINOPHEN 5-325 MG PO TABS: 1.0000 | ORAL_TABLET | ORAL | 0 refills | Status: DC | PRN

## 2018-10-25 ENCOUNTER — Other Ambulatory Visit: Payer: Self-pay | Admitting: *Deleted

## 2018-10-25 ENCOUNTER — Telehealth: Payer: Self-pay | Admitting: Rheumatology

## 2018-10-25 DIAGNOSIS — Z9225 Personal history of immunosupression therapy: Secondary | ICD-10-CM

## 2018-10-25 DIAGNOSIS — L4050 Arthropathic psoriasis, unspecified: Secondary | ICD-10-CM

## 2018-10-25 NOTE — Telephone Encounter (Signed)
Patient left a voicemail stating her Simponi infusion orders need to be sent before they will schedule her next appointment in July.  Patient requested a return call.

## 2018-10-25 NOTE — Telephone Encounter (Signed)
Infusions order placed. Left message to advise patient.

## 2018-11-03 ENCOUNTER — Other Ambulatory Visit: Payer: Self-pay | Admitting: Family Medicine

## 2018-11-29 ENCOUNTER — Other Ambulatory Visit: Payer: Self-pay | Admitting: Family Medicine

## 2018-12-01 ENCOUNTER — Other Ambulatory Visit: Payer: Self-pay | Admitting: *Deleted

## 2018-12-01 NOTE — Progress Notes (Signed)
Infusion orders are current for patient CBC CMP Tylenol Benadryl appointments are up to date and follow up appointment  is scheduled TB gold due and order placed.  

## 2018-12-06 ENCOUNTER — Other Ambulatory Visit: Payer: Self-pay | Admitting: Family Medicine

## 2018-12-06 MED ORDER — OXYCODONE-ACETAMINOPHEN 5-325 MG PO TABS: 1.0000 | ORAL_TABLET | ORAL | 0 refills | Status: DC | PRN

## 2018-12-06 NOTE — Telephone Encounter (Signed)
Patient requesting a refill on Oxycodone     LOV:  07/14/18  LRF:      10/21/18

## 2018-12-14 NOTE — Progress Notes (Signed)
Office Visit Note  Patient: Janet Peterson             Date of Birth: 08/19/1950           MRN: [id number]             PCP: Susy Frizzle, MD Referring: Susy Frizzle, MD Visit Date: 12/27/2018 Occupation: @GUAROCC @  Subjective:  Left piriformis syndrome   History of Present Illness: Dayami Taitt is a 68 y.o. female with history of psoriatic arthritis, osteoarthritis, fibromyalgia, and DDD.  She is receiving Simponi Aria IV infusions 2 mg/kg every 8 weeks.  Her last infusion was on 12/22/18.  She denies any psoriatic arthritis flares. She is having discomfort from left piriformis syndrome. She denies any SI joint pain at this time. She states the left knee replacement is doing well. She denies any right knee joint pain at this time. She denies any knee joint swelling. She denies any active psoriasis at this time.  She denies any achilles tendonitis or plantar fasciitis.  She continues to have chronic fatigue related to fibromyalgia.  She experiences generalized muscle aches and muscle tenderness.    Activities of Daily Living:  Patient reports morning stiffness for 1 hour.   Patient Reports nocturnal pain.  Difficulty dressing/grooming: Reports Difficulty climbing stairs: Denies Difficulty getting out of chair: Denies Difficulty using hands for taps, buttons, cutlery, and/or writing: Reports  Review of Systems  Constitutional: Positive for fatigue.  HENT: Negative for mouth sores, mouth dryness and nose dryness.   Eyes: Negative for itching and dryness.  Respiratory: Negative for shortness of breath, wheezing and difficulty breathing.   Cardiovascular: Positive for swelling in legs/feet. Negative for chest pain.  Gastrointestinal: Negative for abdominal pain, blood in stool, constipation and diarrhea.  Endocrine: Negative for increased urination.  Genitourinary: Negative for painful urination.  Musculoskeletal: Positive for arthralgias, joint pain and morning stiffness.  Negative for joint swelling.  Skin: Negative for rash and redness.  Allergic/Immunologic: Negative for susceptible to infections.  Neurological: Positive for memory loss and weakness. Negative for dizziness, light-headedness and headaches.  Hematological: Positive for bruising/bleeding tendency.  Psychiatric/Behavioral: Positive for sleep disturbance. Negative for confusion.    PMFS History:  Patient Active Problem List   Diagnosis Date Noted   DDD (degenerative disc disease), cervical s/p fusion 11/12/2016   H/O total knee replacement, left 05/06/2016   DDD lumbar spine status post fusion 05/06/2016   Psoriasis 05/05/2016   High risk medication use 05/05/2016   Cervical post-laminectomy syndrome 12/09/2015   Thoracic postlaminectomy syndrome 12/09/2015   Psoriatic arthritis ([place]) 08/23/2012   Osteopenia 08/23/2012   HLD (hyperlipidemia) 08/23/2012   RLS (restless legs syndrome) 08/23/2012   Insomnia 08/23/2012   Fibromyalgia syndrome 08/12/2012   Low back pain 08/12/2012   Syncope    PVC (premature ventricular contraction)    OCD (obsessive compulsive disorder)    GERD (gastroesophageal reflux disease)    Hypertension     Past Medical History:  Diagnosis Date   Ankylosing spondylitis (HCC)    Anxiety and depression    Depression    Fibromyalgia    GERD (gastroesophageal reflux disease)    History of basal cell carcinoma excision    FACE, 1992  &  1996   History of hiatal hernia    History of thrush    Hyperlipidemia    Hypertension    Insomnia    Left breast mass    OCD (obsessive compulsive disorder)  OSA (obstructive sleep apnea)    MILD AND NO CPAP SINCE SURGERY IN 2007   Osteopenia    PONV (postoperative nausea and vomiting)    Psoriatic arthritis (HCC)    PVC (premature ventricular contraction)    Rheumatoid arthritis ([place])     Family History  Problem Relation Age of Onset   Diabetes Mother    Heart disease  Mother    Diabetes Father    Anuerysm Father    Diabetes Brother    Heart disease Brother    Diabetes Brother    Heart disease Brother    Past Surgical History:  Procedure Laterality Date   BREAST LUMPECTOMY WITH RADIOACTIVE SEED LOCALIZATION Left 05/04/2018   Procedure: LEFT BREAST LUMPECTOMY WITH RADIOACTIVE SEED LOCALIZATION AND LEFT BREAST NIPPLE BIOPSY;  Surgeon: Jovita Kussmaul, MD;  Location: [facility] [facility];  Service: General;  Laterality: Left;   BREAST SURGERY  1975   lumpectomy-- benign   BUNIONECTOMY  1991   CATARACT EXTRACTION W/ INTRAOCULAR LENS  IMPLANT, BILATERAL  2000   CERVICAL FUSION  March 2013   C5 -- C7   CESAREAN SECTION  1985   DISTAL INTERPHALANGEAL JOINT FUSION Right 03/14/2015   Procedure: RIGHT LONG FINGER DISTAL INTERPHALANGEAL JOINT ARTHRODESIS;  Surgeon: Iran Planas, MD;  Location: [facility];  Service: Orthopedics;  Laterality: Right;   EYE SURGERY  1995   rk (laser surgery), semi cornea transplant, detacted retina,  fluid removal   KNEE ARTHROSCOPY Left 03-03-2004   POSTERIOR VITRECTOMY RIGHT EYE AND LASER   10-27-1999   SHOULDER SURGERY Right 1997   SPINAL FIXATION SURGERY W/ IMPLANT  2013 rod #1//  2014  rod 2   S1 -- T10  (rod #1)//   S1 -- T4 (rod #2)   THORACIC FUSION  03-13-2013   REMOVAL HARDWARE/  BONE GRAFT FUSION T10//  REVISION OF RODS   TOTAL KNEE ARTHROPLASTY Left 12-14-2005   UVULOPALATOPHARYNGOPLASTY  04-26-2006   w/  TONSILLECTOMY/  TURBINATE REDUCTIONS/  BILATERAL ANTERIOR ETHMOIDECTOMY   Social History   Social History Narrative   Not on file   Immunization History  Administered Date(s) Administered   Influenza Whole 03/04/2012   Influenza, High Dose Seasonal PF 03/25/2018   Influenza,inj,Quad PF,6+ Mos 02/28/2013, 03/07/2014, 02/28/2015, 03/27/2016, 03/09/2017   Pneumococcal Conjugate-13 09/10/2016   Pneumococcal Polysaccharide-23 03/12/2006, 11/09/2011      Objective: Vital Signs: BP 125/84 (BP Location: Left Arm, Patient Position: Sitting, Cuff Size: Normal)    Pulse 81    Resp 14    Ht 5\' 4"  (1.626 m)    Wt 205 lb 12.8 oz (93.4 kg)    BMI 35.33 kg/m    Physical Exam Vitals signs and nursing note reviewed.  Constitutional:      Appearance: She is well-developed.  HENT:     Head: Normocephalic and atraumatic.  Eyes:     Conjunctiva/sclera: Conjunctivae normal.  Neck:     Musculoskeletal: Normal range of motion.  Cardiovascular:     Rate and Rhythm: Normal rate and regular rhythm.     Heart sounds: Normal heart sounds.  Pulmonary:     Effort: Pulmonary effort is normal.     Breath sounds: Normal breath sounds.  Abdominal:     General: Bowel sounds are normal.     Palpations: Abdomen is soft.  Lymphadenopathy:     Cervical: No cervical adenopathy.  Skin:    General: Skin is warm and dry.     Capillary Refill:  Capillary refill takes less than 2 seconds.  Neurological:     Mental Status: She is alert and oriented to person, place, and time.  Psychiatric:        Behavior: Behavior normal.      Musculoskeletal Exam: C-spine spine slightly limited ROM.  Thoracic spine and lumbar spine good ROM.  No midline spinal tenderness.  No SI joint tenderness.  Left piriformis syndrome. Shoulder joints, elbow joints, wrist joints, MCPs, PIPs, and DIPs good ROM with no synovitis.  DIP synovial thickening but no synovitis.  Hip joints good ROM with no discomfort.    CDAI Exam: CDAI Score: -- Patient Global: --; Provider Global: -- Swollen: --; Tender: -- Joint Exam   No joint exam has been documented for this visit   There is currently no information documented on the homunculus. Go to the Rheumatology activity and complete the homunculus joint exam.  Investigation: No additional findings.  Imaging: No results found.  Recent Labs: Lab Results  Component Value Date   WBC 5.3 12/22/2018   HGB 11.7 (L) 12/22/2018   PLT 206  12/22/2018   NA 134 (L) 12/22/2018   K 4.3 12/22/2018   CL 103 12/22/2018   CO2 24 12/22/2018   GLUCOSE 116 (H) 12/22/2018   BUN 8 12/22/2018   CREATININE 0.86 12/22/2018   BILITOT 0.6 12/22/2018   ALKPHOS 88 12/22/2018   AST 27 12/22/2018   ALT 23 12/22/2018   PROT 6.0 (L) 12/22/2018   ALBUMIN 3.5 12/22/2018   CALCIUM 9.5 12/22/2018   GFRAA >60 12/22/2018   QFTBGOLD Indeterminate 08/14/2016   QFTBGOLDPLUS Negative 12/22/2018    Speciality Comments: Simponi Aria every 8 weeks started Jan 2018  TB gold negative 08/19/16  Procedures:  Trigger Point Inj  Date/Time: 12/27/2018 3:54 PM Performed by: Bo Merino, MD Authorized by: Bo Merino, MD   Consent Given by:  Patient Site marked: the procedure site was marked   Timeout: prior to procedure the correct patient, procedure, and site was verified   Indications:  Pain Total # of Trigger Points:  1 Location: hip   Location comment:  Left piriformis injection  Needle Size:  27 G Approach:  Dorsal Medications #1:  0.5 mL lidocaine 1 %; 10 mg triamcinolone acetonide 40 MG/ML Patient tolerance:  Patient tolerated the procedure well with no immediate complications   Allergies: Latex, Amlodipine besy-benazepril hcl, Augmentin [amoxicillin-pot clavulanate], Bextra [valdecoxib], Chlorhexidine, Lipitor [atorvastatin calcium], Sulfa antibiotics, and Zocor [simvastatin]   Assessment / Plan:     Visit Diagnoses: Psoriatic arthritis ([place]) -She has no synovitis or dactylitis on exam.  She has not had any recent psoriatic arthritis flares.  She is clinically doing well on Simponi Aria IV infusions 2 mg/kg every 8 weeks.  Her last infusion was on 12/22/2018.  She has no SI joint tenderness on exam.  She has not had any Achilles tendinitis or plantar fasciitis.  She has no active psoriasis at this time.  She has no joint pain or joint swelling.  She has some difficulty getting up from a chair and climbing steps due to discomfort in  both knees.  Her left knee replacement is doing well overall.  No warmth or effusion of her knee joints were noted on exam.  She will continue getting Simponi Aria infusions every 8 weeks.  She was advised to notify us if she develops increased joint pain or joint swelling.  She will follow-up in the office in 5 months.  Psoriasis - She has  no active psoriasis at this time.  High risk medication use - Simponi Aria IV infusion 2 mg/kg every 8 weeks (last infusion 12/22/2018).  Last TB gold negative on 12/22/2018 and will monitor yearly.  Most recent CBC/CMP stable on 12/22/2018 and will monitor with each infusion.  She was advised to postpone Simponi Aria infusions if she develops any signs or symptoms of an infection and to resume once the infection has completely cleared.  Primary osteoarthritis of both hands -She has DIP synovial thickening but no synovitis noted.  She has complete fist formation bilaterally.  She has no discomfort at this time.  Joint protection and muscle strengthening were discussed.  Piriformis syndrome of left side - She has tenderness over the left piriformis muscle.  She requested a cortisone injection today.  She tolerated procedure well.  Aftercare was discussed.  Procedure note was completed above.  He was given a handout of exercises to perform.  H/O total knee replacement, left - Dr. Wynelle Link - Doing well.  She has good range of motion with no discomfort.  No warmth or effusion of the left knee joint was noted on exam.  She was given knee joint exercises to perform.  Primary osteoarthritis of right knee - She has good ROM with no warmth or effusion.  She had x-rays of the right knee on 07/26/2018 that revealed moderate to severe osteoarthritis and moderate chondromalacia patella.  She has some difficulty getting up from a seated position at times due to discomfort.  She is also been having more frequent falls due to losing balance.  She declined physical therapy at this time.   She was given a handout of knee exercises to perform.  DDD (degenerative disc disease), cervical s/p fusion -She has slightly limited range of motion.  She has no symptoms of radiculopathy at this time.  DDD (degenerative disc disease), thoracic -No midline spinal tenderness.   DDD lumbar spine status post fusion - She has intermittent lower back pain.  She has good range of motion.  No midline spinal tenderness.  She has no symptoms of radiculopathy at this time.  Fibromyalgia - She has generalized muscle aches and muscle tenderness due to fibromyalgia.  She experiences chronic fatigue related to insomnia and fibromyalgia.  She was encouraged to stay active and exercise on a regular basis  Other fatigue - She has chronic fatigue related to insomnia and fibromyalgia.  She was encouraged to stay active and exercise on a regular basis.  Primary insomnia -Overall she has been sleeping well at night.    Other medical conditions are listed as follows:  History of migraine   RLS (restless legs syndrome)   History of hyperlipidemia  History of gastroesophageal reflux (GERD)   History of vitamin D deficiency   History of hypertension   History of OCD (obsessive compulsive disorder)   Orders: Orders Placed This Encounter  Procedures   Trigger Point Inj   No orders of the defined types were placed in this encounter.   Face-to-face time spent with patient was 30 minutes. Greater than 50% of time was spent in counseling and coordination of care.  Follow-Up Instructions: Return in about 5 months (around 05/29/2019) for Psoriatic arthritis, Osteoarthritis, Fibromyalgia, DDD.   Ofilia Neas, PA-C  Note - This record has been created using Dragon software.  Chart creation errors have been sought, but may not always  have been located. Such creation errors do not reflect on  the standard of medical care.

## 2018-12-22 ENCOUNTER — Other Ambulatory Visit: Payer: Self-pay

## 2018-12-22 ENCOUNTER — Ambulatory Visit (HOSPITAL_COMMUNITY)
Admission: RE | Admit: 2018-12-22 | Discharge: 2018-12-22 | Disposition: A | Discharge status: Home / Self Care | Payer: Medicare Other | Source: Ambulatory Visit | Attending: Rheumatology | Admitting: Rheumatology

## 2018-12-22 DIAGNOSIS — L4050 Arthropathic psoriasis, unspecified: Secondary | ICD-10-CM | POA: Diagnosis present

## 2018-12-22 LAB — COMPREHENSIVE METABOLIC PANEL
ALT: 23 U/L (ref 0–44)
AST: 27 U/L (ref 15–41)
Albumin: 3.5 g/dL (ref 3.5–5.0)
Alkaline Phosphatase: 88 U/L (ref 38–126)
Anion gap: 7 (ref 5–15)
BUN: 8 mg/dL (ref 8–23)
CO2: 24 mmol/L (ref 22–32)
Calcium: 9.5 mg/dL (ref 8.9–10.3)
Chloride: 103 mmol/L (ref 98–111)
Creatinine, Ser: 0.86 mg/dL (ref 0.44–1.00)
GFR calc Af Amer: >60 mL/min (ref >60)
GFR calc non Af Amer: >60 mL/min (ref >60)
Glucose, Bld: 116 mg/dL — ABNORMAL HIGH (ref 70–99)
Potassium: 4.3 mmol/L (ref 3.5–5.1)
Sodium: 134 mmol/L — ABNORMAL LOW (ref 135–145)
Total Bilirubin: 0.6 mg/dL (ref 0.3–1.2)
Total Protein: 6 g/dL — ABNORMAL LOW (ref 6.5–8.1)

## 2018-12-22 LAB — CBC
HCT: 36.2 % (ref 36.0–46.0)
Hemoglobin: 11.7 g/dL — ABNORMAL LOW (ref 12.0–15.0)
MCH: 32.2 pg (ref 26.0–34.0)
MCHC: 32.3 g/dL (ref 30.0–36.0)
MCV: 99.7 fL (ref 80.0–100.0)
Platelets: 206 10*3/uL (ref 150–400)
RBC: 3.63 MIL/uL — ABNORMAL LOW (ref 3.87–5.11)
RDW: 13.7 % (ref 11.5–15.5)
WBC: 5.3 10*3/uL (ref 4.0–10.5)
nRBC: 0 % (ref 0.0–0.2)

## 2018-12-22 MED ORDER — DIPHENHYDRAMINE HCL 25 MG PO CAPS: 25.0000 mg | ORAL_CAPSULE | ORAL | Status: DC

## 2018-12-22 MED ORDER — ACETAMINOPHEN 325 MG PO TABS: 650.0000 mg | ORAL_TABLET | ORAL | Status: DC

## 2018-12-22 MED ORDER — SODIUM CHLORIDE 0.9 % IV SOLN
2.0000 mg/kg | INTRAVENOUS | Status: DC
  Administered 2018-12-22: 181.25 mg via INTRAVENOUS
  Filled 2018-12-22: qty 14.5

## 2018-12-25 LAB — QUANTIFERON-TB GOLD PLUS (RQFGPL)
QuantiFERON Mitogen Value: >10 IU/mL
QuantiFERON Nil Value: 0.03 IU/mL
QuantiFERON TB1 Ag Value: 0.03 IU/mL
QuantiFERON TB2 Ag Value: 0.04 IU/mL

## 2018-12-25 LAB — QUANTIFERON-TB GOLD PLUS: QuantiFERON-TB Gold Plus: NEGATIVE

## 2018-12-27 ENCOUNTER — Ambulatory Visit (INDEPENDENT_AMBULATORY_CARE_PROVIDER_SITE_OTHER): Payer: Medicare Other | Admitting: Physician Assistant

## 2018-12-27 ENCOUNTER — Other Ambulatory Visit: Payer: Self-pay

## 2018-12-27 ENCOUNTER — Encounter: Payer: Self-pay | Admitting: Physician Assistant

## 2018-12-27 VITALS — BP 125/84 | HR 81 | Resp 14 | Ht 64.0 in | Wt 205.8 lb

## 2018-12-27 DIAGNOSIS — M19041 Primary osteoarthritis, right hand: Secondary | ICD-10-CM | POA: Diagnosis not present

## 2018-12-27 DIAGNOSIS — L4050 Arthropathic psoriasis, unspecified: Secondary | ICD-10-CM | POA: Diagnosis not present

## 2018-12-27 DIAGNOSIS — Z8639 Personal history of other endocrine, nutritional and metabolic disease: Secondary | ICD-10-CM

## 2018-12-27 DIAGNOSIS — L409 Psoriasis, unspecified: Secondary | ICD-10-CM

## 2018-12-27 DIAGNOSIS — M5134 Other intervertebral disc degeneration, thoracic region: Secondary | ICD-10-CM

## 2018-12-27 DIAGNOSIS — Z8669 Personal history of other diseases of the nervous system and sense organs: Secondary | ICD-10-CM

## 2018-12-27 DIAGNOSIS — Z8719 Personal history of other diseases of the digestive system: Secondary | ICD-10-CM

## 2018-12-27 DIAGNOSIS — Z96652 Presence of left artificial knee joint: Secondary | ICD-10-CM

## 2018-12-27 DIAGNOSIS — Z8679 Personal history of other diseases of the circulatory system: Secondary | ICD-10-CM

## 2018-12-27 DIAGNOSIS — Z8659 Personal history of other mental and behavioral disorders: Secondary | ICD-10-CM

## 2018-12-27 DIAGNOSIS — M19042 Primary osteoarthritis, left hand: Secondary | ICD-10-CM

## 2018-12-27 DIAGNOSIS — Z79899 Other long term (current) drug therapy: Secondary | ICD-10-CM

## 2018-12-27 DIAGNOSIS — F5101 Primary insomnia: Secondary | ICD-10-CM

## 2018-12-27 DIAGNOSIS — M47816 Spondylosis without myelopathy or radiculopathy, lumbar region: Secondary | ICD-10-CM

## 2018-12-27 DIAGNOSIS — R5383 Other fatigue: Secondary | ICD-10-CM

## 2018-12-27 DIAGNOSIS — G2581 Restless legs syndrome: Secondary | ICD-10-CM

## 2018-12-27 DIAGNOSIS — M797 Fibromyalgia: Secondary | ICD-10-CM

## 2018-12-27 DIAGNOSIS — M1711 Unilateral primary osteoarthritis, right knee: Secondary | ICD-10-CM

## 2018-12-27 DIAGNOSIS — G5702 Lesion of sciatic nerve, left lower limb: Secondary | ICD-10-CM

## 2018-12-27 DIAGNOSIS — M5030 Other cervical disc degeneration, unspecified cervical region: Secondary | ICD-10-CM

## 2018-12-27 NOTE — Patient Instructions (Signed)
Piriformis Syndrome Rehab Ask your health care provider which exercises are safe for you. Do exercises exactly as told by your health care provider and adjust them as directed. It is normal to feel mild stretching, pulling, tightness, or discomfort as you do these exercises. Stop right away if you feel sudden pain or your pain gets worse. Do not begin these exercises until told by your health care provider. Stretching and range-of-motion exercises These exercises warm up your muscles and joints and improve the movement and flexibility of your hip and pelvis. The exercises also help to relieve pain, numbness, and tingling. Hip rotation This is an exercise in which you lie on your back and stretch the muscles that rotate your hip (hip rotators) to stretch your buttocks. 1. Lie on your back on a firm surface. 2. Pull your left / right knee toward your same shoulder with your left / right hand until your knee is pointing toward the ceiling. Hold your left / right ankle with your other hand. 3. Keeping your knee steady, gently pull your left / right ankle toward your other shoulder until you feel a stretch in your buttocks. 4. Hold this position for __________ seconds. Repeat __________ times. Complete this exercise __________ times a day. Hip extensor This is an exercise in which you lie on your back and pull your knee to your chest. 1. Lie on your back on a firm surface. Both of your legs should be straight. 2. Pull your left / right knee to your chest. Hold your leg in this position by holding onto the back of your thigh or the front of your knee. 3. Hold this position for __________ seconds. 4. Slowly return to the starting position. Repeat __________ times. Complete this exercise __________ times a day. Strengthening exercises These exercises build strength and endurance in your hip and thigh muscles. Endurance is the ability to use your muscles for a long time, even after they get tired.  Straight leg raises, side-lying This exercise strengthens the muscles that rotate the leg at the hip and move it away from your body (hip abductors). 1. Lie on your side with your left / right leg in the top position. Lie so your head, shoulder, knee, and hip line up. Bend your bottom knee to help you balance. 2. Lift your top leg 4-6 inches (10-15 cm) while keeping your toes pointed straight ahead. 3. Hold this position for __________ seconds. 4. Slowly lower your leg to the starting position. 5. Let your muscles relax completely after each repetition. Repeat __________ times. Complete this exercise __________ times a day. Hip abduction and rotation This is sometimes called quadruped (on hands and knees) exercises. 1. Get on your hands and knees on a firm, lightly padded surface. Your hands should be directly below your shoulders, and your knees should be directly below your hips. 2. Lift your left / right knee out to the side. Keep your knee bent. Do not twist your body. 3. Hold this position for __________ seconds. 4. Slowly lower your leg. Repeat __________ times. Complete this exercise __________ times a day. Straight leg raises, face-down This exercise stretches the muscles that move your hips away from the front of the pelvis (hip extensors). 1. Lie on your abdomen on a bed or a firm surface with a pillow under your hips. 2. Squeeze your buttocks muscles and lift your left / right leg about 4-6 inches (10-15 cm) off the bed. Do not let your back arch. 3. Hold  this position for __________ seconds. 4. Slowly lower your leg to the starting position. 5. Let your muscles relax completely after each repetition. Repeat __________ times. Complete this exercise __________ times a day. This information is not intended to replace advice given to you by your health care provider. Make sure you discuss any questions you have with your health care provider. Document Released: 05/11/2005 Document  Revised: 09/01/2018 Document Reviewed: 03/03/2018 Elsevier Patient Education  2020 [place] for [employer]itioners, 15(4), [phone]. Retrieved February 28, 2018 from http://clinicalkey.com/nursing">  Knee Exercises Ask your health care provider which exercises are safe for you. Do exercises exactly as told by your health care provider and adjust them as directed. It is normal to feel mild stretching, pulling, tightness, or discomfort as you do these exercises. Stop right away if you feel sudden pain or your pain gets worse. Do not begin these exercises until told by your health care provider. Stretching and range-of-motion exercises These exercises warm up your muscles and joints and improve the movement and flexibility of your knee. These exercises also help to relieve pain and swelling. Knee extension, prone 5. Lie on your abdomen (prone position) on a bed. 6. Place your left / right knee just beyond the edge of the surface so your knee is not on the bed. You can put a towel under your left / right thigh just above your kneecap for comfort. 7. Relax your leg muscles and allow gravity to straighten your knee (extension). You should feel a stretch behind your left / right knee. 8. Hold this position for __________ seconds. 9. Scoot up so your knee is supported between repetitions. Repeat __________ times. Complete this exercise __________ times a day. Knee flexion, active  5. Lie on your back with both legs straight. If this causes back discomfort, bend your left / right knee so your foot is flat on the floor. 6. Slowly slide your left / right heel back toward your buttocks. Stop when you feel a gentle stretch in the front of your knee or thigh (flexion). 7. Hold this position for __________ seconds. 8. Slowly slide your left / right heel back to the starting position. Repeat __________ times. Complete this exercise __________ times a day. Quadriceps stretch, prone  6. Lie on your  abdomen on a firm surface, such as a bed or padded floor. 7. Bend your left / right knee and hold your ankle. If you cannot reach your ankle or pant leg, loop a belt around your foot and grab the belt instead. 8. Gently pull your heel toward your buttocks. Your knee should not slide out to the side. You should feel a stretch in the front of your thigh and knee (quadriceps). 9. Hold this position for __________ seconds. Repeat __________ times. Complete this exercise __________ times a day. Hamstring, supine 1. Lie on your back (supine position). 2. Loop a belt or towel over the ball of your left / right foot. The ball of your foot is on the walking surface, right under your toes. 3. Straighten your left / right knee and slowly pull on the belt to raise your leg until you feel a gentle stretch behind your knee (hamstring). ? Do not let your knee bend while you do this. ? Keep your other leg flat on the floor. 4. Hold this position for __________ seconds. Repeat __________ times. Complete this exercise __________ times a day. Strengthening exercises These exercises build strength and endurance in your knee. Endurance is the  ability to use your muscles for a long time, even after they get tired. Quadriceps, isometric This exercise stretches the muscles in front of your thigh (quadriceps) without moving your knee joint (isometric). 6. Lie on your back with your left / right leg extended and your other knee bent. Put a rolled towel or small pillow under your knee if told by your health care provider. 7. Slowly tense the muscles in the front of your left / right thigh. You should see your kneecap slide up toward your hip or see increased dimpling just above the knee. This motion will push the back of the knee toward the floor. 8. For __________ seconds, hold the muscle as tight as you can without increasing your pain. 9. Relax the muscles slowly and completely. Repeat __________ times. Complete this  exercise __________ times a day. Straight leg raises This exercise stretches the muscles in front of your thigh (quadriceps) and the muscles that move your hips (hip flexors). 1. Lie on your back with your left / right leg extended and your other knee bent. 2. Tense the muscles in the front of your left / right thigh. You should see your kneecap slide up or see increased dimpling just above the knee. Your thigh may even shake a bit. 3. Keep these muscles tight as you raise your leg 4-6 inches (10-15 cm) off the floor. Do not let your knee bend. 4. Hold this position for __________ seconds. 5. Keep these muscles tense as you lower your leg. 6. Relax your muscles slowly and completely after each repetition. Repeat __________ times. Complete this exercise __________ times a day. Hamstring, isometric 1. Lie on your back on a firm surface. 2. Bend your left / right knee about __________ degrees. 3. Dig your left / right heel into the surface as if you are trying to pull it toward your buttocks. Tighten the muscles in the back of your thighs (hamstring) to "dig" as hard as you can without increasing any pain. 4. Hold this position for __________ seconds. 5. Release the tension gradually and allow your muscles to relax completely for __________ seconds after each repetition. Repeat __________ times. Complete this exercise __________ times a day. Hamstring curls If told by your health care provider, do this exercise while wearing ankle weights. Begin with __________ lb weights. Then increase the weight by 1 lb (0.5 kg) increments. Do not wear ankle weights that are more than __________ lb. 1. Lie on your abdomen with your legs straight. 2. Bend your left / right knee as far as you can without feeling pain. Keep your hips flat against the floor. 3. Hold this position for __________ seconds. 4. Slowly lower your leg to the starting position. Repeat __________ times. Complete this exercise __________  times a day. Squats This exercise strengthens the muscles in front of your thigh and knee (quadriceps). 1. Stand in front of a table, with your feet and knees pointing straight ahead. You may rest your hands on the table for balance but not for support. 2. Slowly bend your knees and lower your hips like you are going to sit in a chair. ? Keep your weight over your heels, not over your toes. ? Keep your lower legs upright so they are parallel with the table legs. ? Do not let your hips go lower than your knees. ? Do not bend lower than told by your health care provider. ? If your knee pain increases, do not bend as low. 3. Hold  the squat position for __________ seconds. 4. Slowly push with your legs to return to standing. Do not use your hands to pull yourself to standing. Repeat __________ times. Complete this exercise __________ times a day. Wall slides This exercise strengthens the muscles in front of your thigh and knee (quadriceps). 1. Lean your back against a smooth wall or door, and walk your feet out 18-24 inches (46-61 cm) from it. 2. Place your feet hip-width apart. 3. Slowly slide down the wall or door until your knees bend __________ degrees. Keep your knees over your heels, not over your toes. Keep your knees in line with your hips. 4. Hold this position for __________ seconds. Repeat __________ times. Complete this exercise __________ times a day. Straight leg raises This exercise strengthens the muscles that rotate the leg at the hip and move it away from your body (hip abductors). 1. Lie on your side with your left / right leg in the top position. Lie so your head, shoulder, knee, and hip line up. You may bend your bottom knee to help you keep your balance. 2. Roll your hips slightly forward so your hips are stacked directly over each other and your left / right knee is facing forward. 3. Leading with your heel, lift your top leg 4-6 inches (10-15 cm). You should feel the  muscles in your outer hip lifting. ? Do not let your foot drift forward. ? Do not let your knee roll toward the ceiling. 4. Hold this position for __________ seconds. 5. Slowly return your leg to the starting position. 6. Let your muscles relax completely after each repetition. Repeat __________ times. Complete this exercise __________ times a day. Straight leg raises This exercise stretches the muscles that move your hips away from the front of the pelvis (hip extensors). 1. Lie on your abdomen on a firm surface. You can put a pillow under your hips if that is more comfortable. 2. Tense the muscles in your buttocks and lift your left / right leg about 4-6 inches (10-15 cm). Keep your knee straight as you lift your leg. 3. Hold this position for __________ seconds. 4. Slowly lower your leg to the starting position. 5. Let your leg relax completely after each repetition. Repeat __________ times. Complete this exercise __________ times a day. This information is not intended to replace advice given to you by your health care provider. Make sure you discuss any questions you have with your health care provider. Document Released: 03/25/2005 Document Revised: 03/01/2018 Document Reviewed: 03/01/2018 Elsevier Patient Education  2020 [employer].

## 2019-01-05 ENCOUNTER — Encounter (HOSPITAL_COMMUNITY): Payer: Medicare Other

## 2019-01-19 ENCOUNTER — Other Ambulatory Visit: Payer: Self-pay | Admitting: Family Medicine

## 2019-01-19 MED ORDER — OXYCODONE-ACETAMINOPHEN 5-325 MG PO TABS: 1.0000 | ORAL_TABLET | ORAL | 0 refills | Status: DC | PRN

## 2019-01-19 NOTE — Telephone Encounter (Signed)
Patient requesting a refill on Oxycodone     LOV: 07/14/18  LRF:   12/06/18

## 2019-01-26 ENCOUNTER — Other Ambulatory Visit: Payer: Self-pay

## 2019-01-26 ENCOUNTER — Ambulatory Visit (INDEPENDENT_AMBULATORY_CARE_PROVIDER_SITE_OTHER): Payer: Medicare Other | Admitting: Family Medicine

## 2019-01-26 VITALS — BP 120/80 | HR 75 | Temp 98.3°F | Resp 18 | Ht 64.0 in | Wt 202.0 lb

## 2019-01-26 DIAGNOSIS — Z23 Encounter for immunization: Secondary | ICD-10-CM | POA: Diagnosis not present

## 2019-01-26 DIAGNOSIS — M25552 Pain in left hip: Secondary | ICD-10-CM

## 2019-01-26 MED ORDER — PREDNISONE 20 MG PO TABS: ORAL_TABLET | ORAL | 0 refills | Status: DC

## 2019-01-26 MED ORDER — CYCLOBENZAPRINE HCL 10 MG PO TABS: 10.0000 mg | ORAL_TABLET | Freq: Three times a day (TID) | ORAL | 0 refills | Status: DC | PRN

## 2019-01-26 NOTE — Progress Notes (Signed)
Subjective:    Patient ID: Janet Peterson, female    DOB: [birth year], 68 y.o.   MRN: [id number]  HPI 2 weeks ago, the patient fell backwards and landed on her left side against her stairway.  She suffered a contusion to her upper left back just below her scapula, her left posterior hip in her gluteus and over her lateral and posterior left thigh.  There is no visible bruising seen today however the patient complains of pain in the posterior left hip.  She has tenderness to palpation over the left iliac crest.  She denies any chest pain.  She denies any pleurisy.  She denies any hemoptysis.  She denies any hematemesis.  She denies any hematuria.  She denies any nausea or vomiting or blood in her stool.  She is mildly tender to palpation over the iliac crest and also the lower posterior ribs.  However this is minimal.  She has normal flexion internal and external rotation of the left hip without pain. Past Medical History:  Diagnosis Date  . Ankylosing spondylitis ([place])   . Anxiety and depression   . Depression   . Fibromyalgia   . GERD (gastroesophageal reflux disease)   . History of basal cell carcinoma excision    FACE, [place]  . History of hiatal hernia   . History of thrush   . Hyperlipidemia   . Hypertension   . Insomnia   . Left breast mass   . OCD (obsessive compulsive disorder)   . OSA (obstructive sleep apnea)    MILD AND NO CPAP SINCE SURGERY IN 2007  . Osteopenia   . PONV (postoperative nausea and vomiting)   . Psoriatic arthritis ([place])   . PVC (premature ventricular contraction)   . Rheumatoid arthritis [facility])    Past Surgical History:  Procedure Laterality Date  . BREAST LUMPECTOMY WITH RADIOACTIVE SEED LOCALIZATION Left 05/04/2018   Procedure: LEFT BREAST LUMPECTOMY WITH RADIOACTIVE SEED LOCALIZATION AND LEFT BREAST NIPPLE BIOPSY;  Surgeon: Jovita Kussmaul, MD;  Location: [facility];  Service: General;  Laterality: Left;  . BREAST SURGERY  1975   lumpectomy-- benign  . BUNIONECTOMY  1991  . CATARACT EXTRACTION W/ INTRAOCULAR LENS  IMPLANT, BILATERAL  2000  . CERVICAL FUSION  March 2013   C5 -- C7  . CESAREAN SECTION  1985  . DISTAL INTERPHALANGEAL JOINT FUSION Right 03/14/2015   Procedure: RIGHT LONG FINGER DISTAL INTERPHALANGEAL JOINT ARTHRODESIS;  Surgeon: Iran Planas, MD;  Location: [facility];  Service: Orthopedics;  Laterality: Right;  . EYE SURGERY  1995   rk (laser surgery), semi cornea transplant, detacted retina,  fluid removal  . KNEE ARTHROSCOPY Left 03-03-2004  . POSTERIOR VITRECTOMY RIGHT EYE AND LASER   10-27-1999  . SHOULDER SURGERY Right 1997  . SPINAL FIXATION SURGERY W/ IMPLANT  2013 rod #1//  2014  rod 2   S1 -- T10  (rod #1)//   S1 -- T4 (rod #2)  . THORACIC FUSION  03-13-2013   REMOVAL HARDWARE/  BONE GRAFT FUSION T10//  REVISION OF RODS  . TOTAL KNEE ARTHROPLASTY Left 12-14-2005  . UVULOPALATOPHARYNGOPLASTY  04-26-2006   w/  TONSILLECTOMY/  TURBINATE REDUCTIONS/  BILATERAL ANTERIOR ETHMOIDECTOMY   Current Outpatient Medications on File Prior to Visit  Medication Sig Dispense Refill  . amLODipine (NORVASC) 5 MG tablet Take 1 tablet (5 mg total) by mouth daily. 90 tablet 3  . baclofen (LIORESAL) 10 MG tablet Take  10 mg by mouth 2 (two) times daily. Reported on 12/09/2015    . buPROPion (WELLBUTRIN XL) 300 MG 24 hr tablet Take 300 mg by mouth every morning.  1  . clomiPRAMINE (ANAFRANIL) 50 MG capsule Take 50 mg by mouth 2 (two) times daily.    . clonazePAM (KLONOPIN) 1 MG tablet TAKE 1 TABLET BY MOUTH EVERY DAY AS NEEDED FOR ANXIETY  1  . ezetimibe (ZETIA) 10 MG tablet TAKE 1 TABLET BY MOUTH EVERY DAY 90 tablet 0  . fluticasone (FLONASE) 50 MCG/ACT nasal spray Place 2 sprays into both nostrils as needed.     . Golimumab ([place] ARIA IV) Inject into the vein every 8 (eight) weeks.    . metoprolol succinate (TOPROL-XL) 50 MG 24 hr tablet TAKE 1 TABLET BY MOUTH ONCE DAILY FOLLOWING A MEAL 90  tablet 3  . omeprazole (PRILOSEC) 40 MG capsule Take 1 capsule (40 mg total) by mouth daily. 90 capsule 3  . oxyCODONE-acetaminophen (PERCOCET) 5-325 MG tablet Take 1 tablet by mouth every 4 (four) hours as needed. 60 tablet 0  . sucralfate (CARAFATE) 1 g tablet Take 1 tablet (1 g total) by mouth 4 (four) times daily -  with meals and at bedtime. 120 tablet 3  . traZODone (DESYREL) 100 MG tablet Take 100 mg by mouth at bedtime.     . valsartan (DIOVAN) 320 MG tablet TAKE 1 TABLET BY MOUTH EVERY DAY 90 tablet 3  . Vitamin D, Ergocalciferol, (DRISDOL) 1.25 MG (50000 UT) CAPS capsule TAKE 1 CAPSULE (50,000 UNITS TOTAL) BY MOUTH EVERY 30 (THIRTY) DAYS. 3 capsule 1   No current facility-administered medications on file prior to visit.    Allergies  Allergen Reactions  . Latex Rash    And Mouth sores  . Amlodipine Besy-Benazepril Hcl Other (See Comments)    COUGH  . Augmentin [Amoxicillin-Pot Clavulanate] Diarrhea and Nausea And Vomiting  . Bextra [Valdecoxib] Diarrhea and Nausea And Vomiting    REFLUX  . Chlorhexidine     rash  . Lipitor [Atorvastatin Calcium] Other (See Comments)    MYALGIAS  . Sulfa Antibiotics Nausea And Vomiting    CRAMPING  . Zocor [Simvastatin] Other (See Comments)    MYALGIA   Social History   Socioeconomic History  . Marital status: Married    Spouse name: Not on file  . Number of children: Not on file  . Years of education: Not on file  . Highest education level: Not on file  Occupational History  . Not on file  Social Needs  . Financial resource strain: Not on file  . Food insecurity    Worry: Not on file    Inability: Not on file  . Transportation needs    Medical: Not on file    Non-medical: Not on file  Tobacco Use  . Smoking status: Former Smoker    Packs/day: 0.50    Years: 10.00    Pack years: 5.00    Types: Cigarettes    Quit date: 03/07/1995    Years since quitting: 23.9  . Smokeless tobacco: Never Used  Substance and Sexual  Activity  . Alcohol use: Yes    Comment: rare  . Drug use: Never  . Sexual activity: Not on file  Lifestyle  . Physical activity    Days per week: Not on file    Minutes per session: Not on file  . Stress: Not on file  Relationships  . Social Herbalist on phone:  Not on file    Gets together: Not on file    Attends religious service: Not on file    Active member of club or organization: Not on file    Attends meetings of clubs or organizations: Not on file    Relationship status: Not on file  . Intimate partner violence    Fear of current or ex partner: Not on file    Emotionally abused: Not on file    Physically abused: Not on file    Forced sexual activity: Not on file  Other Topics Concern  . Not on file  Social History Narrative  . Not on file      Review of Systems  All other systems reviewed and are negative.      Objective:   Physical Exam  Constitutional: She appears well-developed and well-nourished. No distress.  Neck: Neck supple.  Cardiovascular: Normal rate, regular rhythm and normal heart sounds.  No murmur heard. Pulmonary/Chest: Effort normal and breath sounds normal. No respiratory distress. She has no wheezes. She has no rales.  Abdominal: Soft. Bowel sounds are normal. She exhibits no distension. There is no abdominal tenderness. There is no rebound and no guarding.  Musculoskeletal:        General: No edema.     Lumbar back: She exhibits tenderness, bony tenderness and pain. She exhibits normal range of motion, no swelling, no edema, no deformity and no spasm.       Back:  Skin: She is not diaphoretic.  Vitals reviewed.         Assessment & Plan:  Left hip pain - Plan: DG Hip Unilat W OR W/O Pelvis 2-3 Views Left  I believe the patient likely suffered a contusion to her left gluteus as well as in her left flank.  I see no evidence of a fractured rib.  Patient's breath sounds are equal and symmetric bilaterally and she has no  respiratory distress or pleurisy.  However she continues to have pain in her left posterior hip although her hip range of motion is normal and without pain.  I believe this is a contusion.  There is no visible bruising.  Patient is tried and failed ibuprofen so I will try a prednisone taper pack and she can supplement with Flexeril 5 to 10 mg every 8 hours as needed.  Meanwhile I will obtain an x-ray of the pelvis and left hip.

## 2019-01-29 ENCOUNTER — Other Ambulatory Visit: Payer: Self-pay | Admitting: Family Medicine

## 2019-02-07 ENCOUNTER — Other Ambulatory Visit: Payer: Self-pay | Admitting: Rheumatology

## 2019-02-07 ENCOUNTER — Other Ambulatory Visit: Payer: Self-pay | Admitting: Family Medicine

## 2019-02-08 NOTE — Telephone Encounter (Signed)
Last Visit: 12/27/18 Next Visit: 05/30/19  Okay to refill per Dr. Deveshwar  

## 2019-02-13 ENCOUNTER — Other Ambulatory Visit: Payer: Self-pay | Admitting: *Deleted

## 2019-02-13 NOTE — Progress Notes (Signed)
Infusion orders are current for patient CBC CMP Tylenol Benadryl appointments are up to date and follow up appointment  is scheduled TB gold not due yet.  

## 2019-02-16 ENCOUNTER — Ambulatory Visit (HOSPITAL_COMMUNITY)
Admission: RE | Admit: 2019-02-16 | Discharge: 2019-02-16 | Disposition: A | Discharge status: Home / Self Care | Payer: Medicare Other | Source: Ambulatory Visit | Attending: Rheumatology | Admitting: Rheumatology

## 2019-02-16 ENCOUNTER — Other Ambulatory Visit: Payer: Self-pay

## 2019-02-16 DIAGNOSIS — L4050 Arthropathic psoriasis, unspecified: Secondary | ICD-10-CM | POA: Diagnosis not present

## 2019-02-16 LAB — COMPREHENSIVE METABOLIC PANEL
ALT: 23 U/L (ref 0–44)
AST: 25 U/L (ref 15–41)
Albumin: 3.3 g/dL — ABNORMAL LOW (ref 3.5–5.0)
Alkaline Phosphatase: 84 U/L (ref 38–126)
Anion gap: 9 (ref 5–15)
BUN: 6 mg/dL — ABNORMAL LOW (ref 8–23)
CO2: 26 mmol/L (ref 22–32)
Calcium: 9.3 mg/dL (ref 8.9–10.3)
Chloride: 98 mmol/L (ref 98–111)
Creatinine, Ser: 0.81 mg/dL (ref 0.44–1.00)
GFR calc Af Amer: >60 mL/min (ref >60)
GFR calc non Af Amer: >60 mL/min (ref >60)
Glucose, Bld: 165 mg/dL — ABNORMAL HIGH (ref 70–99)
Potassium: 4.3 mmol/L (ref 3.5–5.1)
Sodium: 133 mmol/L — ABNORMAL LOW (ref 135–145)
Total Bilirubin: 0.5 mg/dL (ref 0.3–1.2)
Total Protein: 6.3 g/dL — ABNORMAL LOW (ref 6.5–8.1)

## 2019-02-16 LAB — CBC
HCT: 38.2 % (ref 36.0–46.0)
Hemoglobin: 12.4 g/dL (ref 12.0–15.0)
MCH: 31.9 pg (ref 26.0–34.0)
MCHC: 32.5 g/dL (ref 30.0–36.0)
MCV: 98.2 fL (ref 80.0–100.0)
Platelets: 203 10*3/uL (ref 150–400)
RBC: 3.89 MIL/uL (ref 3.87–5.11)
RDW: 13.5 % (ref 11.5–15.5)
WBC: 4.5 10*3/uL (ref 4.0–10.5)
nRBC: 0 % (ref 0.0–0.2)

## 2019-02-16 MED ORDER — DIPHENHYDRAMINE HCL 25 MG PO CAPS: 25.0000 mg | ORAL_CAPSULE | ORAL | Status: DC

## 2019-02-16 MED ORDER — ACETAMINOPHEN 325 MG PO TABS: 650.0000 mg | ORAL_TABLET | ORAL | Status: DC

## 2019-02-16 MED ORDER — SODIUM CHLORIDE 0.9 % IV SOLN
2.0000 mg/kg | INTRAVENOUS | Status: DC
  Administered 2019-02-16: 181.25 mg via INTRAVENOUS
  Filled 2019-02-16: qty 14.5

## 2019-02-16 NOTE — Progress Notes (Signed)
stable °

## 2019-03-06 ENCOUNTER — Other Ambulatory Visit: Payer: Self-pay

## 2019-03-07 ENCOUNTER — Other Ambulatory Visit: Payer: Self-pay

## 2019-03-07 ENCOUNTER — Ambulatory Visit (INDEPENDENT_AMBULATORY_CARE_PROVIDER_SITE_OTHER): Payer: Medicare Other | Admitting: Family Medicine

## 2019-03-07 VITALS — BP 134/70 | HR 81 | Temp 98.2°F | Resp 18 | Ht 64.0 in | Wt 207.0 lb

## 2019-03-07 DIAGNOSIS — S20222S Contusion of left back wall of thorax, sequela: Secondary | ICD-10-CM

## 2019-03-07 DIAGNOSIS — S20222D Contusion of left back wall of thorax, subsequent encounter: Secondary | ICD-10-CM

## 2019-03-07 MED ORDER — CELECOXIB 200 MG PO CAPS: 200.0000 mg | ORAL_CAPSULE | Freq: Two times a day (BID) | ORAL | 1 refills | Status: DC

## 2019-03-07 MED ORDER — OXYCODONE-ACETAMINOPHEN 5-325 MG PO TABS: 1.0000 | ORAL_TABLET | ORAL | 0 refills | Status: DC | PRN

## 2019-03-07 NOTE — Telephone Encounter (Signed)
Requested Prescriptions   Pending Prescriptions Disp Refills  . oxyCODONE-acetaminophen (PERCOCET) 5-325 MG tablet 60 tablet 0    Sig: Take 1 tablet by mouth every 4 (four) hours as needed.    Last OV 01/26/2019  Last written 01/19/2019

## 2019-03-07 NOTE — Progress Notes (Signed)
Subjective:    Patient ID: Janet Peterson, female    DOB: January 30, 1951, 68 y.o.   MRN: [id number]  HPI  01/26/19 2 weeks ago, the patient fell backwards and landed on her left side against her stairway.  She suffered a contusion to her upper left back just below her scapula, her left posterior hip in her gluteus and over her lateral and posterior left thigh.  There is no visible bruising seen today however the patient complains of pain in the posterior left hip.  She has tenderness to palpation over the left iliac crest.  She denies any chest pain.  She denies any pleurisy.  She denies any hemoptysis.  She denies any hematemesis.  She denies any hematuria.  She denies any nausea or vomiting or blood in her stool.  She is mildly tender to palpation over the iliac crest and also the lower posterior ribs.  However this is minimal.  She has normal flexion internal and external rotation of the left hip without pain.  At that time, my plan was: I believe the patient likely suffered a contusion to her left gluteus as well as in her left flank.  I see no evidence of a fractured rib.  Patient's breath sounds are equal and symmetric bilaterally and she has no respiratory distress or pleurisy.  However she continues to have pain in her left posterior hip although her hip range of motion is normal and without pain.  I believe this is a contusion.  There is no visible bruising.  Patient is tried and failed ibuprofen so I will try a prednisone taper pack and she can supplement with Flexeril 5 to 10 mg every 8 hours as needed.  Meanwhile I will obtain an x-ray of the pelvis and left hip.  03/07/19 After I saw the patient last time she took the prednisone along with the Flexeril but saw no improvement in the pain.  She ultimately followed up with her orthopedist.  She did not get the initial x-rays that I ordered but she did have x-rays performed at her orthopedist office.  These included x-rays of the lumbar spine and  thoracic spine.  Per the patient's report, the orthopedist did not see any fractures.  The quality of the x-rays and my inexperience makes it difficult to rule out fractures but I see no obvious malalignment or visible fracture or damage to the hardware in her thoracic or lumbar spine.  Her pain now is primarily in the left gluteus and in the lower left flank primarily in the latissimus dorsi.  Please see the diagram below.  She states it hurts to lean forward.  It hurts to straighten up.  Any rotation or flexion of the spine elicits pain in her left flank.  She denies any numbness or weakness radiating down her legs.  She denies any saddle anesthesia or bowel or bladder incontinence. Past Medical History:  Diagnosis Date  . Ankylosing spondylitis ([place])   . Anxiety and depression   . Depression   . Fibromyalgia   . GERD (gastroesophageal reflux disease)   . History of basal cell carcinoma excision    FACE, [place]  . History of hiatal hernia   . History of thrush   . Hyperlipidemia   . Hypertension   . Insomnia   . Left breast mass   . OCD (obsessive compulsive disorder)   . OSA (obstructive sleep apnea)    MILD AND NO CPAP SINCE SURGERY  IN 2007  . Osteopenia   . PONV (postoperative nausea and vomiting)   . Psoriatic arthritis ([place])   . PVC (premature ventricular contraction)   . Rheumatoid arthritis [facility])    Past Surgical History:  Procedure Laterality Date  . BREAST LUMPECTOMY WITH RADIOACTIVE SEED LOCALIZATION Left 05/04/2018   Procedure: LEFT BREAST LUMPECTOMY WITH RADIOACTIVE SEED LOCALIZATION AND LEFT BREAST NIPPLE BIOPSY;  Surgeon: Jovita Kussmaul, MD;  Location: [place];  Service: General;  Laterality: Left;  . BREAST SURGERY  1975   lumpectomy-- benign  . BUNIONECTOMY  1991  . CATARACT EXTRACTION W/ INTRAOCULAR LENS  IMPLANT, BILATERAL  2000  . CERVICAL FUSION  March 2013   C5 -- C7  . CESAREAN SECTION  1985  . DISTAL INTERPHALANGEAL JOINT FUSION  Right 03/14/2015   Procedure: RIGHT LONG FINGER DISTAL INTERPHALANGEAL JOINT ARTHRODESIS;  Surgeon: Iran Planas, MD;  Location: [place];  Service: Orthopedics;  Laterality: Right;  . EYE SURGERY  1995   rk (laser surgery), semi cornea transplant, detacted retina,  fluid removal  . KNEE ARTHROSCOPY Left 03-03-2004  . POSTERIOR VITRECTOMY RIGHT EYE AND LASER   10-27-1999  . SHOULDER SURGERY Right 1997  . SPINAL FIXATION SURGERY W/ IMPLANT  2013 rod #1//  2014  rod 2   S1 -- T10  (rod #1)//   S1 -- T4 (rod #2)  . THORACIC FUSION  03-13-2013   REMOVAL HARDWARE/  BONE GRAFT FUSION T10//  REVISION OF RODS  . TOTAL KNEE ARTHROPLASTY Left 12-14-2005  . UVULOPALATOPHARYNGOPLASTY  04-26-2006   w/  TONSILLECTOMY/  TURBINATE REDUCTIONS/  BILATERAL ANTERIOR ETHMOIDECTOMY   Current Outpatient Medications on File Prior to Visit  Medication Sig Dispense Refill  . amLODipine (NORVASC) 5 MG tablet Take 1 tablet (5 mg total) by mouth daily. 90 tablet 3  . buPROPion (WELLBUTRIN XL) 300 MG 24 hr tablet Take 300 mg by mouth every morning.  1  . clomiPRAMINE (ANAFRANIL) 50 MG capsule Take 50 mg by mouth 2 (two) times daily.    . clonazePAM (KLONOPIN) 1 MG tablet TAKE 1 TABLET BY MOUTH EVERY DAY AS NEEDED FOR ANXIETY  1  . ezetimibe (ZETIA) 10 MG tablet TAKE 1 TABLET BY MOUTH EVERY DAY 90 tablet 0  . fluticasone (FLONASE) 50 MCG/ACT nasal spray Place 2 sprays into both nostrils as needed.     . Golimumab ([place] ARIA IV) Inject into the vein every 8 (eight) weeks.    . metoprolol succinate (TOPROL-XL) 50 MG 24 hr tablet TAKE 1 TABLET BY MOUTH ONCE DAILY FOLLOWING A MEAL 90 tablet 3  . omeprazole (PRILOSEC) 40 MG capsule Take 1 capsule (40 mg total) by mouth daily. 90 capsule 3  . oxyCODONE-acetaminophen (PERCOCET) 5-325 MG tablet Take 1 tablet by mouth every 4 (four) hours as needed. 60 tablet 0  . sucralfate (CARAFATE) 1 g tablet Take 1 tablet (1 g total) by mouth 4 (four) times daily -   with meals and at bedtime. 120 tablet 3  . traZODone (DESYREL) 100 MG tablet Take 100 mg by mouth at bedtime.     . valsartan (DIOVAN) 320 MG tablet TAKE 1 TABLET BY MOUTH EVERY DAY 90 tablet 3  . Vitamin D, Ergocalciferol, (DRISDOL) 1.25 MG (50000 UT) CAPS capsule TAKE 1 CAPSULE BY MOUTH EVERY 30 DAYS. 3 capsule 1  . baclofen (LIORESAL) 10 MG tablet Take 10 mg by mouth 2 (two) times daily. Reported on 12/09/2015     No current  facility-administered medications on file prior to visit.    Allergies  Allergen Reactions  . Latex Rash    And Mouth sores  . Amlodipine Besy-Benazepril Hcl Other (See Comments)    COUGH  . Augmentin [Amoxicillin-Pot Clavulanate] Diarrhea and Nausea And Vomiting  . Bextra [Valdecoxib] Diarrhea and Nausea And Vomiting    REFLUX  . Chlorhexidine     rash  . Lipitor [Atorvastatin Calcium] Other (See Comments)    MYALGIAS  . Sulfa Antibiotics Nausea And Vomiting    CRAMPING  . Zocor [Simvastatin] Other (See Comments)    MYALGIA   Social History   Socioeconomic History  . Marital status: Married    Spouse name: Not on file  . Number of children: Not on file  . Years of education: Not on file  . Highest education level: Not on file  Occupational History  . Not on file  Social Needs  . Financial resource strain: Not on file  . Food insecurity    Worry: Not on file    Inability: Not on file  . Transportation needs    Medical: Not on file    Non-medical: Not on file  Tobacco Use  . Smoking status: Former Smoker    Packs/day: 0.50    Years: 10.00    Pack years: 5.00    Types: Cigarettes    Quit date: 03/07/1995    Years since quitting: 24.0  . Smokeless tobacco: Never Used  Substance and Sexual Activity  . Alcohol use: Yes    Comment: rare  . Drug use: Never  . Sexual activity: Not on file  Lifestyle  . Physical activity    Days per week: Not on file    Minutes per session: Not on file  . Stress: Not on file  Relationships  . Social  Herbalist on phone: Not on file    Gets together: Not on file    Attends religious service: Not on file    Active member of club or organization: Not on file    Attends meetings of clubs or organizations: Not on file    Relationship status: Not on file  . Intimate partner violence    Fear of current or ex partner: Not on file    Emotionally abused: Not on file    Physically abused: Not on file    Forced sexual activity: Not on file  Other Topics Concern  . Not on file  Social History Narrative  . Not on file      Review of Systems  Musculoskeletal: Positive for back pain.  All other systems reviewed and are negative.      Objective:   Physical Exam  Constitutional: She appears well-developed and well-nourished. No distress.  Neck: Neck supple.  Cardiovascular: Normal rate, regular rhythm and normal heart sounds.  No murmur heard. Pulmonary/Chest: Effort normal and breath sounds normal. No respiratory distress. She has no wheezes. She has no rales.  Abdominal: Soft. Bowel sounds are normal. She exhibits no distension. There is no abdominal tenderness. There is no rebound and no guarding.  Musculoskeletal:        General: No edema.     Lumbar back: She exhibits tenderness and pain. She exhibits normal range of motion, no bony tenderness, no swelling, no edema, no deformity and no spasm.       Back:  Skin: She is not diaphoretic.  Vitals reviewed.         Assessment & Plan:  Back contusion, left, sequela  No obvious fractures are seen in the x-ray obtained at the orthopedics office.  Believe most of this is a contusion to the muscles in her lower back primarily latissimus dorsi and the gluteus maximus.  Therefore I have recommended the patient perform stretches at home to try to increase the flexibility in these muscles and reduce the scar tissue as the injury heals.  I have provided the patient with a series of 4 stretches I want her to start to perform  every day.  I also recommended starting Celebrex 200 mg twice daily.  Patient has seen more benefit from the muscle relaxers that her orthopedist has given her rather than the Flexeril I gave her.  Therefore I have encouraged her to continue the methocarbamol 500 mg every 6 hours as needed and discontinue the Flexeril.

## 2019-03-30 ENCOUNTER — Other Ambulatory Visit: Payer: Self-pay | Admitting: *Deleted

## 2019-03-30 NOTE — Progress Notes (Signed)
Infusion orders are current for patient CBC CMP Tylenol Benadryl appointments are up to date and follow up appointment  is scheduled TB gold not due yet.  

## 2019-04-13 ENCOUNTER — Ambulatory Visit (HOSPITAL_COMMUNITY)
Admission: RE | Admit: 2019-04-13 | Discharge: 2019-04-13 | Disposition: A | Discharge status: Home / Self Care | Payer: Medicare Other | Source: Ambulatory Visit | Attending: Rheumatology | Admitting: Rheumatology

## 2019-04-13 ENCOUNTER — Other Ambulatory Visit: Payer: Self-pay

## 2019-04-13 DIAGNOSIS — L4050 Arthropathic psoriasis, unspecified: Secondary | ICD-10-CM | POA: Diagnosis present

## 2019-04-13 LAB — CBC
HCT: 35.9 % — ABNORMAL LOW (ref 36.0–46.0)
Hemoglobin: 11.7 g/dL — ABNORMAL LOW (ref 12.0–15.0)
MCH: 31.9 pg (ref 26.0–34.0)
MCHC: 32.6 g/dL (ref 30.0–36.0)
MCV: 97.8 fL (ref 80.0–100.0)
Platelets: 189 10*3/uL (ref 150–400)
RBC: 3.67 MIL/uL — ABNORMAL LOW (ref 3.87–5.11)
RDW: 13.2 % (ref 11.5–15.5)
WBC: 4.3 10*3/uL (ref 4.0–10.5)
nRBC: 0 % (ref 0.0–0.2)

## 2019-04-13 LAB — COMPREHENSIVE METABOLIC PANEL
ALT: 20 U/L (ref 0–44)
AST: 27 U/L (ref 15–41)
Albumin: 3.5 g/dL (ref 3.5–5.0)
Alkaline Phosphatase: 75 U/L (ref 38–126)
Anion gap: 9 (ref 5–15)
BUN: 6 mg/dL — ABNORMAL LOW (ref 8–23)
CO2: 25 mmol/L (ref 22–32)
Calcium: 9.3 mg/dL (ref 8.9–10.3)
Chloride: 98 mmol/L (ref 98–111)
Creatinine, Ser: 0.76 mg/dL (ref 0.44–1.00)
GFR calc Af Amer: >60 mL/min (ref >60)
GFR calc non Af Amer: >60 mL/min (ref >60)
Glucose, Bld: 136 mg/dL — ABNORMAL HIGH (ref 70–99)
Potassium: 4.2 mmol/L (ref 3.5–5.1)
Sodium: 132 mmol/L — ABNORMAL LOW (ref 135–145)
Total Bilirubin: 0.7 mg/dL (ref 0.3–1.2)
Total Protein: 6 g/dL — ABNORMAL LOW (ref 6.5–8.1)

## 2019-04-13 MED ORDER — DIPHENHYDRAMINE HCL 25 MG PO CAPS
ORAL_CAPSULE | ORAL | Status: AC
  Filled 2019-04-13: qty 1

## 2019-04-13 MED ORDER — DIPHENHYDRAMINE HCL 25 MG PO CAPS
25.0000 mg | ORAL_CAPSULE | ORAL | Status: DC
  Administered 2019-04-13: 25 mg via ORAL

## 2019-04-13 MED ORDER — SODIUM CHLORIDE 0.9 % IV SOLN
2.0000 mg/kg | INTRAVENOUS | Status: DC
  Administered 2019-04-13: 181.25 mg via INTRAVENOUS
  Filled 2019-04-13: qty 14.5

## 2019-04-13 MED ORDER — ACETAMINOPHEN 325 MG PO TABS
ORAL_TABLET | ORAL | Status: AC
  Filled 2019-04-13: qty 2

## 2019-04-13 MED ORDER — ACETAMINOPHEN 325 MG PO TABS
650.0000 mg | ORAL_TABLET | ORAL | Status: DC
  Administered 2019-04-13: 13:00:00 650 mg via ORAL

## 2019-04-13 NOTE — Progress Notes (Signed)
stable °

## 2019-04-18 ENCOUNTER — Other Ambulatory Visit: Payer: Self-pay | Admitting: Family Medicine

## 2019-04-18 MED ORDER — OXYCODONE-ACETAMINOPHEN 5-325 MG PO TABS: 1.0000 | ORAL_TABLET | ORAL | 0 refills | Status: DC | PRN

## 2019-04-18 NOTE — Telephone Encounter (Signed)
Patient requesting a refill on Oxycodone     LOV:  03/07/2019  LRF:      03/07/2019

## 2019-05-15 ENCOUNTER — Other Ambulatory Visit: Payer: Self-pay | Admitting: Family Medicine

## 2019-05-30 ENCOUNTER — Ambulatory Visit: Payer: Medicare Other | Admitting: Rheumatology

## 2019-05-31 ENCOUNTER — Other Ambulatory Visit: Payer: Self-pay | Admitting: Family Medicine

## 2019-05-31 NOTE — Telephone Encounter (Signed)
Patient requesting a refill on Oxycodone     LOV:  03/07/19  LRF:      04/18/19

## 2019-06-01 MED ORDER — OXYCODONE-ACETAMINOPHEN 5-325 MG PO TABS: 1.0000 | ORAL_TABLET | ORAL | 0 refills | Status: DC | PRN

## 2019-06-05 ENCOUNTER — Other Ambulatory Visit: Payer: Self-pay

## 2019-06-05 ENCOUNTER — Encounter: Payer: Self-pay | Admitting: Family Medicine

## 2019-06-05 ENCOUNTER — Ambulatory Visit (INDEPENDENT_AMBULATORY_CARE_PROVIDER_SITE_OTHER): Payer: Medicare Other | Admitting: Family Medicine

## 2019-06-05 VITALS — BP 102/64 | HR 70 | Temp 96.4°F | Resp 16 | Ht 64.0 in | Wt 206.0 lb

## 2019-06-05 DIAGNOSIS — R413 Other amnesia: Secondary | ICD-10-CM

## 2019-06-05 DIAGNOSIS — M961 Postlaminectomy syndrome, not elsewhere classified: Secondary | ICD-10-CM

## 2019-06-05 DIAGNOSIS — E7800 Pure hypercholesterolemia, unspecified: Secondary | ICD-10-CM

## 2019-06-05 DIAGNOSIS — Z79899 Other long term (current) drug therapy: Secondary | ICD-10-CM | POA: Diagnosis not present

## 2019-06-05 NOTE — Progress Notes (Signed)
Subjective:    Patient ID: Janet Peterson, female    DOB: Mar 27, 1951, 69 y.o.   MRN: [id number]  HPI Patient reports mild short-term memory loss.  She states that both she and her husband have noticed this recently.  His only occasional.  She states she has good days where everything is fine and then she will have an occasional day where she will forget things.  For instance she is having a difficult time remembering phone numbers to people that she calls frequently.  She also was recently having a conversation with her friend and could not remember the focus of their conversation afterwards.  She denies losing items.  She denies forgetting money.  She does not pay bills her husband does this.  She has not been leaving on any appliances such as stove.  She is not getting lost driving.  She has not falling.  She does have some depression.  She takes Klonopin every 2 to 3 days for anxiety when she feels extremely overwhelmed.  She suffers with anxiety due to the death of her son.  She also takes oxycodone 1 tablet at night due to her chronic back pain.  However this is only at night and does not seem to be related to when she is experienced the memory issues.  She is on clomipramine for OCD as well as trazodone for insomnia under the care of a psychiatrist however she has been on these medications for years.  She denies any recent infections although she does have some mild dysuria.  We have not checked her thyroid or B12 several years.  She does have a history of hypertension which is currently well controlled hyperlipidemia which she is on Zetia for.  She does have polypharmacy which could be contributing to memory issues.  She denies any strokelike symptoms.  I performed a Mini-Mental status exam.  She scored 29 out of 30.  She easily spell world in reverse and was able to perform serial sevens with no difficulty.  She was only able to remember 2 out of 3 objects on recall.  However the remainder of her  neurologic exam is normal. Past Medical History:  Diagnosis Date  . Ankylosing spondylitis ([place])   . Anxiety and depression   . Depression   . Fibromyalgia   . GERD (gastroesophageal reflux disease)   . History of basal cell carcinoma excision    FACE, [place]  . History of hiatal hernia   . History of thrush   . Hyperlipidemia   . Hypertension   . Insomnia   . Left breast mass   . OCD (obsessive compulsive disorder)   . OSA (obstructive sleep apnea)    MILD AND NO CPAP SINCE SURGERY IN 2007  . Osteopenia   . PONV (postoperative nausea and vomiting)   . Psoriatic arthritis ([place])   . PVC (premature ventricular contraction)   . Rheumatoid arthritis [facility])    Past Surgical History:  Procedure Laterality Date  . BREAST LUMPECTOMY WITH RADIOACTIVE SEED LOCALIZATION Left 05/04/2018   Procedure: LEFT BREAST LUMPECTOMY WITH RADIOACTIVE SEED LOCALIZATION AND LEFT BREAST NIPPLE BIOPSY;  Surgeon: Jovita Kussmaul, MD;  Location: [place];  Service: General;  Laterality: Left;  . BREAST SURGERY  1975   lumpectomy-- benign  . BUNIONECTOMY  1991  . CATARACT EXTRACTION W/ INTRAOCULAR LENS  IMPLANT, BILATERAL  2000  . CERVICAL FUSION  March 2013   C5 -- C7  .  CESAREAN SECTION  1985  . DISTAL INTERPHALANGEAL JOINT FUSION Right 03/14/2015   Procedure: RIGHT LONG FINGER DISTAL INTERPHALANGEAL JOINT ARTHRODESIS;  Surgeon: Iran Planas, MD;  Location: [place];  Service: Orthopedics;  Laterality: Right;  . EYE SURGERY  1995   rk (laser surgery), semi cornea transplant, detacted retina,  fluid removal  . KNEE ARTHROSCOPY Left 03-03-2004  . POSTERIOR VITRECTOMY RIGHT EYE AND LASER   10-27-1999  . SHOULDER SURGERY Right 1997  . SPINAL FIXATION SURGERY W/ IMPLANT  2013 rod #1//  2014  rod 2   S1 -- T10  (rod #1)//   S1 -- T4 (rod #2)  . THORACIC FUSION  03-13-2013   REMOVAL HARDWARE/  BONE GRAFT FUSION T10//  REVISION OF RODS  . TOTAL KNEE ARTHROPLASTY Left  12-14-2005  . UVULOPALATOPHARYNGOPLASTY  04-26-2006   w/  TONSILLECTOMY/  TURBINATE REDUCTIONS/  BILATERAL ANTERIOR ETHMOIDECTOMY   Current Outpatient Medications on File Prior to Visit  Medication Sig Dispense Refill  . amLODipine (NORVASC) 5 MG tablet Take 1 tablet (5 mg total) by mouth daily. 90 tablet 3  . buPROPion (WELLBUTRIN XL) 300 MG 24 hr tablet Take 300 mg by mouth every morning.  1  . clomiPRAMINE (ANAFRANIL) 50 MG capsule Take 50 mg by mouth 2 (two) times daily.    . clonazePAM (KLONOPIN) 1 MG tablet TAKE 1 TABLET BY MOUTH EVERY DAY AS NEEDED FOR ANXIETY  1  . ezetimibe (ZETIA) 10 MG tablet TAKE 1 TABLET BY MOUTH EVERY DAY 90 tablet 0  . fluticasone (FLONASE) 50 MCG/ACT nasal spray Place 2 sprays into both nostrils as needed.     . Golimumab ([place] ARIA IV) Inject into the vein every 8 (eight) weeks.    . metoprolol succinate (TOPROL-XL) 50 MG 24 hr tablet TAKE 1 TABLET BY MOUTH ONCE DAILY FOLLOWING A MEAL 90 tablet 3  . omeprazole (PRILOSEC) 40 MG capsule Take 1 capsule (40 mg total) by mouth daily. 90 capsule 3  . oxyCODONE-acetaminophen (PERCOCET) 5-325 MG tablet Take 1 tablet by mouth every 4 (four) hours as needed. 60 tablet 0  . sucralfate (CARAFATE) 1 g tablet Take 1 tablet (1 g total) by mouth 4 (four) times daily -  with meals and at bedtime. 120 tablet 3  . traZODone (DESYREL) 100 MG tablet Take 100 mg by mouth at bedtime.     . valsartan (DIOVAN) 320 MG tablet TAKE 1 TABLET BY MOUTH EVERY DAY 90 tablet 3  . Vitamin D, Ergocalciferol, (DRISDOL) 1.25 MG (50000 UT) CAPS capsule TAKE 1 CAPSULE BY MOUTH EVERY 30 DAYS. 3 capsule 1   No current facility-administered medications on file prior to visit.   Allergies  Allergen Reactions  . Latex Rash    And Mouth sores  . Amlodipine Besy-Benazepril Hcl Other (See Comments)    COUGH  . Augmentin [Amoxicillin-Pot Clavulanate] Diarrhea and Nausea And Vomiting  . Bextra [Valdecoxib] Diarrhea and Nausea And Vomiting    REFLUX   . Chlorhexidine     rash  . Lipitor [Atorvastatin Calcium] Other (See Comments)    MYALGIAS  . Sulfa Antibiotics Nausea And Vomiting    CRAMPING  . Zocor [Simvastatin] Other (See Comments)    MYALGIA   Social History   Socioeconomic History  . Marital status: Married    Spouse name: Not on file  . Number of children: Not on file  . Years of education: Not on file  . Highest education level: Not on file  Occupational  History  . Not on file  Tobacco Use  . Smoking status: Former Smoker    Packs/day: 0.50    Years: 10.00    Pack years: 5.00    Types: Cigarettes    Quit date: 03/07/1995    Years since quitting: 24.2  . Smokeless tobacco: Never Used  Substance and Sexual Activity  . Alcohol use: Yes    Comment: rare  . Drug use: Never  . Sexual activity: Not on file  Other Topics Concern  . Not on file  Social History Narrative  . Not on file   Social Determinants of Health   Financial Resource Strain:   . Difficulty of Paying Living Expenses: Not on file  Food Insecurity:   . Worried About Charity fundraiser in the Last Year: Not on file  . Ran Out of Food in the Last Year: Not on file  Transportation Needs:   . Lack of Transportation (Medical): Not on file  . Lack of Transportation (Non-Medical): Not on file  Physical Activity:   . Days of Exercise per Week: Not on file  . Minutes of Exercise per Session: Not on file  Stress:   . Feeling of Stress : Not on file  Social Connections:   . Frequency of Communication with Friends and Family: Not on file  . Frequency of Social Gatherings with Friends and Family: Not on file  . Attends Religious Services: Not on file  . Active Member of Clubs or Organizations: Not on file  . Attends Archivist Meetings: Not on file  . Marital Status: Not on file  Intimate Partner Violence:   . Fear of Current or Ex-Partner: Not on file  . Emotionally Abused: Not on file  . Physically Abused: Not on file  . Sexually  Abused: Not on file      Review of Systems  All other systems reviewed and are negative.      Objective:   Physical Exam  Constitutional: She appears well-developed and well-nourished. No distress.  Cardiovascular: Normal rate, regular rhythm and normal heart sounds.  No murmur heard. Pulmonary/Chest: Effort normal and breath sounds normal. No respiratory distress. She has no wheezes. She has no rales.  Abdominal: Soft. Bowel sounds are normal. She exhibits no distension. There is no abdominal tenderness. There is no rebound and no guarding.  Musculoskeletal:        General: No edema.     Cervical back: Neck supple.     Thoracic back: Pain present. Decreased range of motion.     Lumbar back: Pain present. No swelling, edema, deformity, spasms, tenderness or bony tenderness. Decreased range of motion.  Skin: She is not diaphoretic.  Vitals reviewed.         Assessment & Plan:  Memory loss - Plan: Vitamin B12, TSH, CBC with Differential, COMPLETE METABOLIC PANEL WITH GFR, Urinalysis, Routine w reflex microscopic  High risk medication use  Thoracic postlaminectomy syndrome  Cervical post-laminectomy syndrome  Pure hypercholesterolemia  Patient has mild memory loss.  This is nonspecific.  Could represent normal age-related memory decline or slight cognitive impairment.  It may also be contributed to by polypharmacy which we discussed at length.  I would recommend checking for thyroid issues or vitamin B12 deficiency.  Neurologic exam is normal and shows no evidence of stroke.  The only abnormality on her review of systems that she is having occasional myoclonic jerks in her extremities at night however at the present time they are  mild and do not warrant therapy.  Obtain baseline lab work.  Try to limit centrally acting substances as much as possible.  If this is becomes progressive, I will treat the patient for possible early Alzheimer's disease however at the present time the  severity of the memory loss does not warrant starting Aricept

## 2019-06-06 LAB — CBC WITH DIFFERENTIAL/PLATELET
Absolute Monocytes: 448 cells/uL (ref 200–950)
Basophils Absolute: 62 cells/uL (ref 0–200)
Basophils Relative: 1.1 %
Eosinophils Absolute: 129 cells/uL (ref 15–500)
Eosinophils Relative: 2.3 %
HCT: 39.3 % (ref 35.0–45.0)
Hemoglobin: 13 g/dL (ref 11.7–15.5)
Lymphs Abs: 2195 cells/uL (ref 850–3900)
MCH: 31.5 pg (ref 27.0–33.0)
MCHC: 33.1 g/dL (ref 32.0–36.0)
MCV: 95.2 fL (ref 80.0–100.0)
MPV: 10.6 fL (ref 7.5–12.5)
Monocytes Relative: 8 %
Neutro Abs: 2766 cells/uL (ref 1500–7800)
Neutrophils Relative %: 49.4 %
Platelets: 214 10*3/uL (ref 140–400)
RBC: 4.13 10*6/uL (ref 3.80–5.10)
RDW: 12.2 % (ref 11.0–15.0)
Total Lymphocyte: 39.2 %
WBC: 5.6 10*3/uL (ref 3.8–10.8)

## 2019-06-06 LAB — COMPLETE METABOLIC PANEL WITH GFR
AG Ratio: 1.5 (calc) (ref 1.0–2.5)
ALT: 18 U/L (ref 6–29)
AST: 24 U/L (ref 10–35)
Albumin: 3.8 g/dL (ref 3.6–5.1)
Alkaline phosphatase (APISO): 87 U/L (ref 37–153)
BUN: 10 mg/dL (ref 7–25)
CO2: 28 mmol/L (ref 20–32)
Calcium: 9.7 mg/dL (ref 8.6–10.4)
Chloride: 98 mmol/L (ref 98–110)
Creat: 0.86 mg/dL (ref 0.50–0.99)
GFR, Est African American: 80 mL/min/{1.73_m2} (ref >=60)
GFR, Est Non African American: 69 mL/min/{1.73_m2} (ref >=60)
Globulin: 2.5 g/dL (calc) (ref 1.9–3.7)
Glucose, Bld: 119 mg/dL — ABNORMAL HIGH (ref 65–99)
Potassium: 4.4 mmol/L (ref 3.5–5.3)
Sodium: 134 mmol/L — ABNORMAL LOW (ref 135–146)
Total Bilirubin: 0.3 mg/dL (ref 0.2–1.2)
Total Protein: 6.3 g/dL (ref 6.1–8.1)

## 2019-06-06 LAB — VITAMIN B12: Vitamin B-12: 417 pg/mL (ref 200–1100)

## 2019-06-06 LAB — TIQ-MISC

## 2019-06-06 LAB — URINALYSIS, ROUTINE W REFLEX MICROSCOPIC

## 2019-06-06 LAB — TSH: TSH: 1.55 mIU/L (ref 0.40–4.50)

## 2019-06-08 ENCOUNTER — Other Ambulatory Visit: Payer: Self-pay

## 2019-06-08 ENCOUNTER — Ambulatory Visit (HOSPITAL_COMMUNITY)
Admission: RE | Admit: 2019-06-08 | Discharge: 2019-06-08 | Disposition: A | Discharge status: Home / Self Care | Payer: Medicare Other | Source: Ambulatory Visit | Attending: Rheumatology | Admitting: Rheumatology

## 2019-06-08 DIAGNOSIS — L4050 Arthropathic psoriasis, unspecified: Secondary | ICD-10-CM

## 2019-06-08 LAB — URINALYSIS, ROUTINE W REFLEX MICROSCOPIC
Bilirubin Urine: NEGATIVE
Glucose, UA: NEGATIVE
Hgb urine dipstick: NEGATIVE
Hyaline Cast: NONE SEEN /LPF
Ketones, ur: NEGATIVE
Leukocytes,Ua: NEGATIVE
Nitrite: POSITIVE — AB
Protein, ur: NEGATIVE
RBC / HPF: NONE SEEN /HPF (ref 0–2)
Specific Gravity, Urine: 1.005 (ref 1.001–1.03)
Squamous Epithelial / HPF: NONE SEEN /HPF (ref <=5)
WBC, UA: NONE SEEN /HPF (ref 0–5)
pH: 6.5 (ref 5.0–8.0)

## 2019-06-08 MED ORDER — DIPHENHYDRAMINE HCL 25 MG PO CAPS: 25.0000 mg | ORAL_CAPSULE | ORAL | Status: DC

## 2019-06-08 MED ORDER — SODIUM CHLORIDE 0.9 % IV SOLN
2.0000 mg/kg | INTRAVENOUS | Status: AC
  Administered 2019-06-08: 181.25 mg via INTRAVENOUS
  Filled 2019-06-08: qty 14.5

## 2019-06-08 MED ORDER — ACETAMINOPHEN 325 MG PO TABS: 650.0000 mg | ORAL_TABLET | ORAL | Status: DC

## 2019-06-09 ENCOUNTER — Other Ambulatory Visit: Payer: Self-pay | Admitting: Family Medicine

## 2019-06-09 MED ORDER — NITROFURANTOIN MONOHYD MACRO 100 MG PO CAPS: 100.0000 mg | ORAL_CAPSULE | Freq: Two times a day (BID) | ORAL | 0 refills | Status: DC

## 2019-07-01 ENCOUNTER — Other Ambulatory Visit: Payer: Self-pay | Admitting: Family Medicine

## 2019-07-06 ENCOUNTER — Other Ambulatory Visit: Payer: Self-pay | Admitting: Family Medicine

## 2019-07-06 NOTE — Telephone Encounter (Signed)
Received phone call from pt's husband requesting a refill on this medication. We have not rx's it since 2017  Requesting refill  Klonopin  LOV:  06/05/19  LRF:  03/31/16

## 2019-07-07 MED ORDER — CLONAZEPAM 1 MG PO TABS: ORAL_TABLET | ORAL | 1 refills | Status: AC

## 2019-07-16 ENCOUNTER — Other Ambulatory Visit: Payer: Self-pay | Admitting: Family Medicine

## 2019-07-18 ENCOUNTER — Other Ambulatory Visit: Payer: Self-pay | Admitting: Family Medicine

## 2019-07-18 NOTE — Telephone Encounter (Signed)
Patient requesting a refill on Oxycodone     LOV: 06/05/19  LRF:   06/01/19

## 2019-07-20 MED ORDER — OXYCODONE-ACETAMINOPHEN 5-325 MG PO TABS: 1.0000 | ORAL_TABLET | ORAL | 0 refills | Status: DC | PRN

## 2019-07-24 NOTE — Progress Notes (Signed)
Office Visit Note  Patient: Janet Peterson             Date of Birth: [birth year]           MRN: [id number]             PCP: Susy Frizzle, MD Referring: Susy Frizzle, MD Visit Date: 07/26/2019 Occupation: @GUAROCC @  Subjective:  Stiffness in both knee joints   History of Present Illness: Sanora Balderston is a 69 y.o. female with history of psoriatic arthritis, osteoarthritis, fibromyalgia, and DDD.  She is on Simponi Aria IV infusions every 8 weeks.  She has not had any recent psoriatic arthritis flares.  She states that she has been having increased stiffness in both knee joints especially after being seated for prolonged periods of time.  She denies any pain or swelling in her knee joints at this time.  Her left knee joint is replaced.  She reports that her knee joint stiffness seemed better when she was on combination therapy with Arava 20 mg daily.  She would like to discuss restarting on Arava.  She has occasional pain in her right CMC joint but denies any joint swelling.  She denies any Achilles denies or plantar fasciitis.  She has not had any SI joint pain.  She continues to have chronic lower back pain and has had several lumbar surgeries in the past.  She has occasional patches of psoriasis on her face and uses a topical agent prescribed by her dermatologist which has been effective.  Activities of Daily Living:  Patient reports morning stiffness for 1 hour.   Patient Denies nocturnal pain.  Difficulty dressing/grooming: Reports Difficulty climbing stairs: Reports Difficulty getting out of chair: Reports Difficulty using hands for taps, buttons, cutlery, and/or writing: Reports  Review of Systems  Constitutional: Positive for fatigue.  HENT: Negative for mouth sores, mouth dryness and nose dryness.   Eyes: Positive for dryness. Negative for itching.  Respiratory: Negative for shortness of breath and difficulty breathing.   Cardiovascular: Negative for chest pain and  palpitations.  Gastrointestinal: Negative for blood in stool, constipation and diarrhea.  Endocrine: Negative for increased urination.  Genitourinary: Negative for difficulty urinating and painful urination.  Musculoskeletal: Positive for arthralgias, joint pain and morning stiffness. Negative for joint swelling.  Skin: Negative for rash.  Allergic/Immunologic: Negative for susceptible to infections.  Neurological: Negative for dizziness, headaches and memory loss.  Psychiatric/Behavioral: Negative for confusion and sleep disturbance.    PMFS History:  Patient Active Problem List   Diagnosis Date Noted  . DDD (degenerative disc disease), cervical s/p fusion 11/12/2016  . H/O total knee replacement, left 05/06/2016  . DDD lumbar spine status post fusion 05/06/2016  . Psoriasis 05/05/2016  . High risk medication use 05/05/2016  . Cervical post-laminectomy syndrome 12/09/2015  . Thoracic postlaminectomy syndrome 12/09/2015  . Psoriatic arthritis ([place]) 08/23/2012  . Osteopenia 08/23/2012  . HLD (hyperlipidemia) 08/23/2012  . RLS (restless legs syndrome) 08/23/2012  . Insomnia 08/23/2012  . Fibromyalgia syndrome 08/12/2012  . Low back pain 08/12/2012  . Syncope   . PVC (premature ventricular contraction)   . OCD (obsessive compulsive disorder)   . GERD (gastroesophageal reflux disease)   . Hypertension     Past Medical History:  Diagnosis Date  . Ankylosing spondylitis ([place])   . Anxiety and depression   . Depression   . Fibromyalgia   . GERD (gastroesophageal reflux disease)   . History of basal cell carcinoma excision  [facility]  . History of hiatal hernia   . History of thrush   . Hyperlipidemia   . Hypertension   . Insomnia   . Left breast mass   . OCD (obsessive compulsive disorder)   . OSA (obstructive sleep apnea)    MILD AND NO CPAP SINCE SURGERY IN 2007  . Osteopenia   . PONV (postoperative nausea and vomiting)   . Psoriatic arthritis ([place])   .  PVC (premature ventricular contraction)   . Rheumatoid arthritis ([place])     Family History  Problem Relation Age of Onset  . Diabetes Mother   . Heart disease Mother   . Diabetes Father   . Anuerysm Father   . Diabetes Brother   . Heart disease Brother   . Diabetes Brother   . Heart disease Brother    Past Surgical History:  Procedure Laterality Date  . BREAST LUMPECTOMY WITH RADIOACTIVE SEED LOCALIZATION Left 05/04/2018   Procedure: LEFT BREAST LUMPECTOMY WITH RADIOACTIVE SEED LOCALIZATION AND LEFT BREAST NIPPLE BIOPSY;  Surgeon: Jovita Kussmaul, MD;  Location: [facility];  Service: General;  Laterality: Left;  . BREAST SURGERY  1975   lumpectomy-- benign  . BUNIONECTOMY  1991  . CATARACT EXTRACTION W/ INTRAOCULAR LENS  IMPLANT, BILATERAL  2000  . CERVICAL FUSION  March 2013   C5 -- C7  . CESAREAN SECTION  1985  . DISTAL INTERPHALANGEAL JOINT FUSION Right 03/14/2015   Procedure: RIGHT LONG FINGER DISTAL INTERPHALANGEAL JOINT ARTHRODESIS;  Surgeon: Iran Planas, MD;  Location: [facility];  Service: Orthopedics;  Laterality: Right;  . EYE SURGERY  1995   rk (laser surgery), semi cornea transplant, detacted retina,  fluid removal  . KNEE ARTHROSCOPY Left 03-03-2004  . POSTERIOR VITRECTOMY RIGHT EYE AND LASER   10-27-1999  . SHOULDER SURGERY Right 1997  . SPINAL FIXATION SURGERY W/ IMPLANT  2013 rod #1//  2014  rod 2   S1 -- T10  (rod #1)//   S1 -- T4 (rod #2)  . THORACIC FUSION  03-13-2013   REMOVAL HARDWARE/  BONE GRAFT FUSION T10//  REVISION OF RODS  . TOTAL KNEE ARTHROPLASTY Left 12-14-2005  . UVULOPALATOPHARYNGOPLASTY  04-26-2006   w/  TONSILLECTOMY/  TURBINATE REDUCTIONS/  BILATERAL ANTERIOR ETHMOIDECTOMY   Social History   Social History Narrative  . Not on file   Immunization History  Administered Date(s) Administered  . Fluad Quad(high Dose 65+) 01/26/2019  . Influenza Whole 03/04/2012  . Influenza, High Dose Seasonal PF 03/25/2018    . Influenza,inj,Quad PF,6+ Mos 02/28/2013, 03/07/2014, 02/28/2015, 03/27/2016, 03/09/2017  . Pneumococcal Conjugate-13 09/10/2016  . Pneumococcal Polysaccharide-23 03/12/2006, 11/09/2011     Objective: Vital Signs: BP 123/79 (BP Location: Left Arm, Patient Position: Sitting, Cuff Size: Normal)   Pulse 79   Resp 16   Ht 5\' 4"  (1.626 m)   Wt 201 lb 9.6 oz (91.4 kg)   BMI 34.60 kg/m    Physical Exam Vitals and nursing note reviewed.  Constitutional:      Appearance: She is well-developed.  HENT:     Head: Normocephalic and atraumatic.  Eyes:     Conjunctiva/sclera: Conjunctivae normal.  Pulmonary:     Effort: Pulmonary effort is normal.  Abdominal:     General: Bowel sounds are normal.     Palpations: Abdomen is soft.  Musculoskeletal:     Cervical back: Normal range of motion.  Lymphadenopathy:     Cervical: No cervical adenopathy.  Skin:    General: Skin is warm and dry.     Capillary Refill: Capillary refill takes less than 2 seconds.  Neurological:     Mental Status: She is alert and oriented to person, place, and time.  Psychiatric:        Behavior: Behavior normal.      Musculoskeletal Exam: C-spine limited range of motion with lateral rotation.  Thoracic kyphosis noted.  Limited range of motion lumbar spine with discomfort.  Midline spinal tenderness in the lumbar region.  No SI joint tenderness noted.  Shoulder joints, elbow joints, wrist joints, MCPs, PIPs, DIPs good range of motion with no synovitis.  Synovial thickening and tenderness of the right CMC joint.  She has PIP and DIP synovial thickening consistent with osteoarthritis of both hands.  She has complete fist formation bilaterally.  Hip joints have good range of motion with no discomfort at this time.  Left knee joint has slightly limited extension but no warmth or effusion was noted.  Right knee has good range of motion with no warmth or effusion.  She has right knee crepitus.  Ankle joints have good range  of motion with no tenderness or synovitis.  CDAI Exam: CDAI Score: -- Patient Global: --; Provider Global: -- Swollen: --; Tender: -- Joint Exam 07/26/2019   No joint exam has been documented for this visit   There is currently no information documented on the homunculus. Go to the Rheumatology activity and complete the homunculus joint exam.  Investigation: No additional findings.  Imaging: No results found.  Recent Labs: Lab Results  Component Value Date   WBC 5.6 06/05/2019   HGB 13.0 06/05/2019   PLT 214 06/05/2019   NA 134 (L) 06/05/2019   K 4.4 06/05/2019   CL 98 06/05/2019   CO2 28 06/05/2019   GLUCOSE 119 (H) 06/05/2019   BUN 10 06/05/2019   CREATININE 0.86 06/05/2019   BILITOT 0.3 06/05/2019   ALKPHOS 75 04/13/2019   AST 24 06/05/2019   ALT 18 06/05/2019   PROT 6.3 06/05/2019   ALBUMIN 3.5 04/13/2019   CALCIUM 9.7 06/05/2019   GFRAA 80 06/05/2019   QFTBGOLD Indeterminate 08/14/2016   QFTBGOLDPLUS Negative 12/22/2018    Speciality Comments: Simponi Aria every 8 weeks started Jan 2018  TB gold negative 08/19/16  Procedures:  No procedures performed Allergies: Latex, Amlodipine besy-benazepril hcl, Augmentin [amoxicillin-pot clavulanate], Bextra [valdecoxib], Chlorhexidine, Lipitor [atorvastatin calcium], Sulfa antibiotics, and Zocor [simvastatin]   Assessment / Plan:     Visit Diagnoses: Psoriatic arthritis ([place]): She has no synovitis or dactylitis on exam.  She has not had any recent psoriatic arthritis flares.  She is clinically doing well on Simponi Aria IV infusions every 8 weeks.  She is been experiencing increased stiffness in both knee joints.  The left knee joint is replaced.  We will apply for Visco gel injections for the right knee joint.  She has not had any pain or joint swelling in her knee joints recently.  No warmth or effusion was noted on exam today.  She declined a referral for physical therapy at this time but we did discuss the importance of  lower extremity muscle strengthening and fall prevention.  She is not having any other increased joint pain or joint swelling recently.  No Achilles tendinitis or plantar fasciitis.  She has not had any SI joint pain.  She is occasional psoriasis on her face and uses topical agent prescribed by her dermatologist which has been effective.  She will continue on Simponi Aria IV infusions every 8 weeks as monotherapy.  She was advised to notify us if she develops increased joint pain or joint swelling.  She will follow-up in the office in 5 months.  Psoriasis: She is occasional small patches of psoriasis on her face and uses a topical agent prescribed by her dermatologist which has been effective.  High risk medication use - Simponi Aria IV infusion 2 mg/kg every 8 weeks. Last TB gold negative on 12/22/2018 and will monitor yearly.  CBC and CMP were drawn on 06/05/2019.  She has lab work drawn with her infusions.  Primary osteoarthritis of both hands: She has PIP and DIP synovial thickening consistent with osteoarthritis of both hands.  She has tenderness and synovial thickening of the right CMC joint.  No synovitis was noted.  She has complete fist formation bilaterally.  Joint protection and muscle strengthening were discussed.   Piriformis syndrome of left side: Resolved.  She had a cortisone injection on 12/27/2018 which resolved her symptoms.   H/O total knee replacement, left - Dr. Wynelle Link: Doing well.  Doing well.  She has no discomfort or joint effusion at this time.  She is been experiencing increased stiffness in the left knee after sitting for prolonged periods of time.  We discussed the importance of changing positions frequently and working on lower extremity muscle strengthening.  She declined referral to physical therapy at this time.  Primary osteoarthritis of right knee: She has good range of motion of the right knee joint on exam.  No warmth or effusion was noted.  She has right knee crepitus.   She is been experiencing increase stiffness in the right knee joint after sitting for prolonged periods of time.  She is having difficulty rising from a seated position but has not had any discomfort or joint swelling.  She was given a handout of knee joint exercises to perform.  She declined referral to physical therapy at this time.  Discussed importance of lower extremity muscle strengthening and fall prevention.  We will apply for Visco gel injections for the right knee joint.  DDD (degenerative disc disease), cervical s/p fusion: She has limited range of motion especially with lateral rotation.  She has no symptoms of radiculopathy at this time.  DDD (degenerative disc disease), thoracic: No midline spinal tenderness at this time.  She has occasional discomfort in the thoracic spine.  DDD lumbar spine status post fusion: She has chronic pain and stiffness.  No symptoms of radiculopathy.  Fibromyalgia: Overall her fibromyalgia pain has been tolerable recently.  Her fatigue level is also improved.  She was previously taking trazodone 100 mg by mouth at bedtime but discontinued due to not finding it to be effective and also causing daytime drowsiness.  We discussed trying melatonin over-the-counter as needed for insomnia.  We discussed importance of regular exercise and good sleep hygiene.  Other fatigue: Her level of fatigue is improving.   Primary insomnia: She was previously taking trazodone 100 mg 1 tablet by mouth at bedtime but discontinued due to experiencing daytime drowsiness the following morning.   Other medical conditions are listed as follows:  History of OCD (obsessive compulsive disorder)  History of gastroesophageal reflux (GERD)  RLS (restless legs syndrome)  History of migraine  History of hyperlipidemia  History of hypertension  History of vitamin D deficiency -She requested to have her vitamin D level checked with her upcoming lab work.  Future orders placed today.   Plan:  VITAMIN D 25 Hydroxy (Vit-D Deficiency, Fractures)  Orders: Orders Placed This Encounter  Procedures  . VITAMIN D 25 Hydroxy (Vit-D Deficiency, Fractures)   No orders of the defined types were placed in this encounter.     Follow-Up Instructions: Return in about 5 months (around 12/26/2019) for Psoriatic arthritis, Fibromyalgia, Osteoarthritis.   Ofilia Neas, PA-C  Note - This record has been created using Dragon software.  Chart creation errors have been sought, but may not always  have been located. Such creation errors do not reflect on  the standard of medical care.

## 2019-07-26 ENCOUNTER — Other Ambulatory Visit: Payer: Self-pay

## 2019-07-26 ENCOUNTER — Ambulatory Visit (INDEPENDENT_AMBULATORY_CARE_PROVIDER_SITE_OTHER): Payer: Medicare Other | Admitting: Physician Assistant

## 2019-07-26 ENCOUNTER — Encounter: Payer: Self-pay | Admitting: Physician Assistant

## 2019-07-26 ENCOUNTER — Telehealth: Payer: Self-pay

## 2019-07-26 VITALS — BP 123/79 | HR 79 | Resp 16 | Ht 64.0 in | Wt 201.6 lb

## 2019-07-26 DIAGNOSIS — Z96652 Presence of left artificial knee joint: Secondary | ICD-10-CM

## 2019-07-26 DIAGNOSIS — L4050 Arthropathic psoriasis, unspecified: Secondary | ICD-10-CM | POA: Diagnosis not present

## 2019-07-26 DIAGNOSIS — M47816 Spondylosis without myelopathy or radiculopathy, lumbar region: Secondary | ICD-10-CM

## 2019-07-26 DIAGNOSIS — M797 Fibromyalgia: Secondary | ICD-10-CM

## 2019-07-26 DIAGNOSIS — Z8679 Personal history of other diseases of the circulatory system: Secondary | ICD-10-CM

## 2019-07-26 DIAGNOSIS — Z79899 Other long term (current) drug therapy: Secondary | ICD-10-CM | POA: Diagnosis not present

## 2019-07-26 DIAGNOSIS — M19041 Primary osteoarthritis, right hand: Secondary | ICD-10-CM

## 2019-07-26 DIAGNOSIS — Z8639 Personal history of other endocrine, nutritional and metabolic disease: Secondary | ICD-10-CM

## 2019-07-26 DIAGNOSIS — Z8659 Personal history of other mental and behavioral disorders: Secondary | ICD-10-CM

## 2019-07-26 DIAGNOSIS — M19042 Primary osteoarthritis, left hand: Secondary | ICD-10-CM

## 2019-07-26 DIAGNOSIS — Z8719 Personal history of other diseases of the digestive system: Secondary | ICD-10-CM

## 2019-07-26 DIAGNOSIS — L409 Psoriasis, unspecified: Secondary | ICD-10-CM | POA: Diagnosis not present

## 2019-07-26 DIAGNOSIS — R5383 Other fatigue: Secondary | ICD-10-CM

## 2019-07-26 DIAGNOSIS — M5134 Other intervertebral disc degeneration, thoracic region: Secondary | ICD-10-CM

## 2019-07-26 DIAGNOSIS — G5702 Lesion of sciatic nerve, left lower limb: Secondary | ICD-10-CM

## 2019-07-26 DIAGNOSIS — M1711 Unilateral primary osteoarthritis, right knee: Secondary | ICD-10-CM

## 2019-07-26 DIAGNOSIS — Z8669 Personal history of other diseases of the nervous system and sense organs: Secondary | ICD-10-CM

## 2019-07-26 DIAGNOSIS — F5101 Primary insomnia: Secondary | ICD-10-CM

## 2019-07-26 DIAGNOSIS — G2581 Restless legs syndrome: Secondary | ICD-10-CM

## 2019-07-26 DIAGNOSIS — M5030 Other cervical disc degeneration, unspecified cervical region: Secondary | ICD-10-CM

## 2019-07-26 NOTE — Telephone Encounter (Signed)
Please apply for right knee visco, per Taylor Dale, PA-C. Thanks!  

## 2019-07-26 NOTE — Patient Instructions (Signed)
Journal for Nurse Practitioners, 15(4), 263-267. Retrieved February 28, 2018 from http://clinicalkey.com/nursing">  Knee Exercises Ask your health care provider which exercises are safe for you. Do exercises exactly as told by your health care provider and adjust them as directed. It is normal to feel mild stretching, pulling, tightness, or discomfort as you do these exercises. Stop right away if you feel sudden pain or your pain gets worse. Do not begin these exercises until told by your health care provider. Stretching and range-of-motion exercises These exercises warm up your muscles and joints and improve the movement and flexibility of your knee. These exercises also help to relieve pain and swelling. Knee extension, prone 1. Lie on your abdomen (prone position) on a bed. 2. Place your left / right knee just beyond the edge of the surface so your knee is not on the bed. You can put a towel under your left / right thigh just above your kneecap for comfort. 3. Relax your leg muscles and allow gravity to straighten your knee (extension). You should feel a stretch behind your left / right knee. 4. Hold this position for __________ seconds. 5. Scoot up so your knee is supported between repetitions. Repeat __________ times. Complete this exercise __________ times a day. Knee flexion, active  1. Lie on your back with both legs straight. If this causes back discomfort, bend your left / right knee so your foot is flat on the floor. 2. Slowly slide your left / right heel back toward your buttocks. Stop when you feel a gentle stretch in the front of your knee or thigh (flexion). 3. Hold this position for __________ seconds. 4. Slowly slide your left / right heel back to the starting position. Repeat __________ times. Complete this exercise __________ times a day. Quadriceps stretch, prone  1. Lie on your abdomen on a firm surface, such as a bed or padded floor. 2. Bend your left / right knee and hold  your ankle. If you cannot reach your ankle or pant leg, loop a belt around your foot and grab the belt instead. 3. Gently pull your heel toward your buttocks. Your knee should not slide out to the side. You should feel a stretch in the front of your thigh and knee (quadriceps). 4. Hold this position for __________ seconds. Repeat __________ times. Complete this exercise __________ times a day. Hamstring, supine 1. Lie on your back (supine position). 2. Loop a belt or towel over the ball of your left / right foot. The ball of your foot is on the walking surface, right under your toes. 3. Straighten your left / right knee and slowly pull on the belt to raise your leg until you feel a gentle stretch behind your knee (hamstring). ? Do not let your knee bend while you do this. ? Keep your other leg flat on the floor. 4. Hold this position for __________ seconds. Repeat __________ times. Complete this exercise __________ times a day. Strengthening exercises These exercises build strength and endurance in your knee. Endurance is the ability to use your muscles for a long time, even after they get tired. Quadriceps, isometric This exercise stretches the muscles in front of your thigh (quadriceps) without moving your knee joint (isometric). 1. Lie on your back with your left / right leg extended and your other knee bent. Put a rolled towel or small pillow under your knee if told by your health care provider. 2. Slowly tense the muscles in the front of your left /   right thigh. You should see your kneecap slide up toward your hip or see increased dimpling just above the knee. This motion will push the back of the knee toward the floor. 3. For __________ seconds, hold the muscle as tight as you can without increasing your pain. 4. Relax the muscles slowly and completely. Repeat __________ times. Complete this exercise __________ times a day. Straight leg raises This exercise stretches the muscles in front  of your thigh (quadriceps) and the muscles that move your hips (hip flexors). 1. Lie on your back with your left / right leg extended and your other knee bent. 2. Tense the muscles in the front of your left / right thigh. You should see your kneecap slide up or see increased dimpling just above the knee. Your thigh may even shake a bit. 3. Keep these muscles tight as you raise your leg 4-6 inches (10-15 cm) off the floor. Do not let your knee bend. 4. Hold this position for __________ seconds. 5. Keep these muscles tense as you lower your leg. 6. Relax your muscles slowly and completely after each repetition. Repeat __________ times. Complete this exercise __________ times a day. Hamstring, isometric 1. Lie on your back on a firm surface. 2. Bend your left / right knee about __________ degrees. 3. Dig your left / right heel into the surface as if you are trying to pull it toward your buttocks. Tighten the muscles in the back of your thighs (hamstring) to "dig" as hard as you can without increasing any pain. 4. Hold this position for __________ seconds. 5. Release the tension gradually and allow your muscles to relax completely for __________ seconds after each repetition. Repeat __________ times. Complete this exercise __________ times a day. Hamstring curls If told by your health care provider, do this exercise while wearing ankle weights. Begin with __________ lb weights. Then increase the weight by 1 lb (0.5 kg) increments. Do not wear ankle weights that are more than __________ lb. 1. Lie on your abdomen with your legs straight. 2. Bend your left / right knee as far as you can without feeling pain. Keep your hips flat against the floor. 3. Hold this position for __________ seconds. 4. Slowly lower your leg to the starting position. Repeat __________ times. Complete this exercise __________ times a day. Squats This exercise strengthens the muscles in front of your thigh and knee  (quadriceps). 1. Stand in front of a table, with your feet and knees pointing straight ahead. You may rest your hands on the table for balance but not for support. 2. Slowly bend your knees and lower your hips like you are going to sit in a chair. ? Keep your weight over your heels, not over your toes. ? Keep your lower legs upright so they are parallel with the table legs. ? Do not let your hips go lower than your knees. ? Do not bend lower than told by your health care provider. ? If your knee pain increases, do not bend as low. 3. Hold the squat position for __________ seconds. 4. Slowly push with your legs to return to standing. Do not use your hands to pull yourself to standing. Repeat __________ times. Complete this exercise __________ times a day. Wall slides This exercise strengthens the muscles in front of your thigh and knee (quadriceps). 1. Lean your back against a smooth wall or door, and walk your feet out 18-24 inches (46-61 cm) from it. 2. Place your feet hip-width apart. 3.   Slowly slide down the wall or door until your knees bend __________ degrees. Keep your knees over your heels, not over your toes. Keep your knees in line with your hips. 4. Hold this position for __________ seconds. Repeat __________ times. Complete this exercise __________ times a day. Straight leg raises This exercise strengthens the muscles that rotate the leg at the hip and move it away from your body (hip abductors). 1. Lie on your side with your left / right leg in the top position. Lie so your head, shoulder, knee, and hip line up. You may bend your bottom knee to help you keep your balance. 2. Roll your hips slightly forward so your hips are stacked directly over each other and your left / right knee is facing forward. 3. Leading with your heel, lift your top leg 4-6 inches (10-15 cm). You should feel the muscles in your outer hip lifting. ? Do not let your foot drift forward. ? Do not let your knee  roll toward the ceiling. 4. Hold this position for __________ seconds. 5. Slowly return your leg to the starting position. 6. Let your muscles relax completely after each repetition. Repeat __________ times. Complete this exercise __________ times a day. Straight leg raises This exercise stretches the muscles that move your hips away from the front of the pelvis (hip extensors). 1. Lie on your abdomen on a firm surface. You can put a pillow under your hips if that is more comfortable. 2. Tense the muscles in your buttocks and lift your left / right leg about 4-6 inches (10-15 cm). Keep your knee straight as you lift your leg. 3. Hold this position for __________ seconds. 4. Slowly lower your leg to the starting position. 5. Let your leg relax completely after each repetition. Repeat __________ times. Complete this exercise __________ times a day. This information is not intended to replace advice given to you by your health care provider. Make sure you discuss any questions you have with your health care provider. Document Revised: 03/01/2018 Document Reviewed: 03/01/2018 Elsevier Patient Education  2020 [employer]  

## 2019-07-27 NOTE — Telephone Encounter (Signed)
Please schedule patient an appointment with Hazel Sams, [facility] for gel injections.  Approved for Orthovisc series, Right Knee. Buy and bill  Deductible does not apply. No PA required. No Co- Pay Secondary [facility]) will pick up remaining eligible expenses at 100%.

## 2019-07-28 ENCOUNTER — Other Ambulatory Visit: Payer: Self-pay | Admitting: *Deleted

## 2019-07-28 NOTE — Progress Notes (Signed)
Infusion orders are current for patient CBC CMP Tylenol Benadryl appointments are up to date and follow up appointment  is scheduled TB gold not due yet.  

## 2019-08-03 ENCOUNTER — Other Ambulatory Visit: Payer: Self-pay

## 2019-08-03 ENCOUNTER — Ambulatory Visit (HOSPITAL_COMMUNITY)
Admission: RE | Admit: 2019-08-03 | Discharge: 2019-08-03 | Disposition: A | Discharge status: Home / Self Care | Payer: Medicare Other | Source: Ambulatory Visit | Attending: Rheumatology | Admitting: Rheumatology

## 2019-08-03 DIAGNOSIS — L4050 Arthropathic psoriasis, unspecified: Secondary | ICD-10-CM | POA: Diagnosis present

## 2019-08-03 LAB — CBC
HCT: 39.6 % (ref 36.0–46.0)
Hemoglobin: 13.1 g/dL (ref 12.0–15.0)
MCH: 31.6 pg (ref 26.0–34.0)
MCHC: 33.1 g/dL (ref 30.0–36.0)
MCV: 95.4 fL (ref 80.0–100.0)
Platelets: 221 10*3/uL (ref 150–400)
RBC: 4.15 MIL/uL (ref 3.87–5.11)
RDW: 13.5 % (ref 11.5–15.5)
WBC: 6.1 10*3/uL (ref 4.0–10.5)
nRBC: 0 % (ref 0.0–0.2)

## 2019-08-03 LAB — COMPREHENSIVE METABOLIC PANEL
ALT: 23 U/L (ref 0–44)
AST: 34 U/L (ref 15–41)
Albumin: 3.5 g/dL (ref 3.5–5.0)
Alkaline Phosphatase: 103 U/L (ref 38–126)
Anion gap: 10 (ref 5–15)
BUN: 10 mg/dL (ref 8–23)
CO2: 25 mmol/L (ref 22–32)
Calcium: 9.5 mg/dL (ref 8.9–10.3)
Chloride: 103 mmol/L (ref 98–111)
Creatinine, Ser: 0.92 mg/dL (ref 0.44–1.00)
GFR calc Af Amer: >60 mL/min (ref >60)
GFR calc non Af Amer: >60 mL/min (ref >60)
Glucose, Bld: 109 mg/dL — ABNORMAL HIGH (ref 70–99)
Potassium: 4.1 mmol/L (ref 3.5–5.1)
Sodium: 138 mmol/L (ref 135–145)
Total Bilirubin: 1.1 mg/dL (ref 0.3–1.2)
Total Protein: 6.4 g/dL — ABNORMAL LOW (ref 6.5–8.1)

## 2019-08-03 MED ORDER — DIPHENHYDRAMINE HCL 25 MG PO CAPS: 25.0000 mg | ORAL_CAPSULE | ORAL | Status: DC

## 2019-08-03 MED ORDER — ACETAMINOPHEN 325 MG PO TABS: 650.0000 mg | ORAL_TABLET | ORAL | Status: DC

## 2019-08-03 MED ORDER — SODIUM CHLORIDE 0.9 % IV SOLN
2.0000 mg/kg | INTRAVENOUS | Status: AC
  Administered 2019-08-03: 182.5 mg via INTRAVENOUS
  Filled 2019-08-03: qty 14.6

## 2019-08-07 ENCOUNTER — Other Ambulatory Visit: Payer: Self-pay

## 2019-08-07 ENCOUNTER — Ambulatory Visit (INDEPENDENT_AMBULATORY_CARE_PROVIDER_SITE_OTHER): Payer: Medicare Other | Admitting: Physician Assistant

## 2019-08-07 DIAGNOSIS — M1711 Unilateral primary osteoarthritis, right knee: Secondary | ICD-10-CM

## 2019-08-07 NOTE — Progress Notes (Signed)
   Procedure Note  Patient: Janet Peterson             Date of Birth: [birth year]           MRN: [id number]             Visit Date: 08/07/2019  Procedures: Visit Diagnoses:  1. Primary osteoarthritis of right knee    Orthovisc #1 right knee joint injection  NDC: [id number] Lot #: [id number] Expiration: 12/22/2020  Large Joint Inj: R knee on 08/07/2019 12:06 PM Indications: pain Details: 25 G 1.5 in needle, medial approach  Arthrogram: No  Medications: 1.5 mL lidocaine 1 %; 30 mg Hyaluronan 30 MG/2ML Aspirate: 0 mL Outcome: tolerated well, no immediate complications Procedure, treatment alternatives, risks and benefits explained, specific risks discussed. Consent was given by the patient. Immediately prior to procedure a time out was called to verify the correct patient, procedure, equipment, support staff and site/side marked as required. Patient was prepped and draped in the usual sterile fashion.      Patient tolerated the procedure well.  Aftercare was discussed.  Hazel Sams, PA-C

## 2019-08-14 ENCOUNTER — Ambulatory Visit (INDEPENDENT_AMBULATORY_CARE_PROVIDER_SITE_OTHER): Payer: Medicare Other | Admitting: Physician Assistant

## 2019-08-14 ENCOUNTER — Other Ambulatory Visit: Payer: Self-pay

## 2019-08-14 DIAGNOSIS — M1711 Unilateral primary osteoarthritis, right knee: Secondary | ICD-10-CM

## 2019-08-14 MED ORDER — LIDOCAINE HCL 1 % IJ SOLN
1.5000 mL | INTRAMUSCULAR | Status: AC | PRN
  Administered 2019-08-14: 1.5 mL

## 2019-08-14 MED ORDER — HYALURONAN 30 MG/2ML IX SOSY
30.0000 mg | PREFILLED_SYRINGE | INTRA_ARTICULAR | Status: AC | PRN
  Administered 2019-08-14: 30 mg via INTRA_ARTICULAR

## 2019-08-14 NOTE — Progress Notes (Signed)
   Procedure Note  Patient: Janet Peterson             Date of Birth: 1950/12/21           MRN: [id number]             Visit Date: 08/14/2019  Procedures: Visit Diagnoses:  1. Primary osteoarthritis of right knee     Orthovisc #2 right knee, B/B Large Joint Inj: R knee on 08/14/2019 10:39 AM Indications: pain Details: 25 G 1.5 in needle, medial approach  Arthrogram: No  Medications: 1.5 mL lidocaine 1 %; 30 mg Hyaluronan 30 MG/2ML Aspirate: 0 mL Outcome: tolerated well, no immediate complications Procedure, treatment alternatives, risks and benefits explained, specific risks discussed. Consent was given by the patient. Immediately prior to procedure a time out was called to verify the correct patient, procedure, equipment, support staff and site/side marked as required. Patient was prepped and draped in the usual sterile fashion.    Patient tolerated the procedure well. Aftercare was dicussed.  Hazel Sams, PA-C

## 2019-08-21 ENCOUNTER — Other Ambulatory Visit: Payer: Self-pay

## 2019-08-21 ENCOUNTER — Ambulatory Visit (INDEPENDENT_AMBULATORY_CARE_PROVIDER_SITE_OTHER): Payer: Medicare Other | Admitting: Physician Assistant

## 2019-08-21 DIAGNOSIS — M1711 Unilateral primary osteoarthritis, right knee: Secondary | ICD-10-CM | POA: Diagnosis not present

## 2019-08-21 MED ORDER — HYALURONAN 30 MG/2ML IX SOSY
30.0000 mg | PREFILLED_SYRINGE | INTRA_ARTICULAR | Status: AC | PRN
  Administered 2019-08-21: 30 mg via INTRA_ARTICULAR

## 2019-08-21 MED ORDER — LIDOCAINE HCL 1 % IJ SOLN
1.5000 mL | INTRAMUSCULAR | Status: AC | PRN
  Administered 2019-08-21: 1.5 mL

## 2019-08-21 NOTE — Progress Notes (Signed)
   Procedure Note  Patient: Janet Peterson             Date of Birth: 1951/03/15           MRN: [id number]             Visit Date: 08/21/2019  Procedures: Visit Diagnoses:  1. Primary osteoarthritis of right knee     Orthovisc #3 right knee, B/B Large Joint Inj: R knee on 08/21/2019 9:16 AM Indications: pain Details: 25 G 1.5 in needle, medial approach  Arthrogram: No  Medications: 1.5 mL lidocaine 1 %; 30 mg Hyaluronan 30 MG/2ML Aspirate: 0 mL Outcome: tolerated well, no immediate complications Procedure, treatment alternatives, risks and benefits explained, specific risks discussed. Consent was given by the patient. Immediately prior to procedure a time out was called to verify the correct patient, procedure, equipment, support staff and site/side marked as required. Patient was prepped and draped in the usual sterile fashion.      Patient tolerated the procedure well. Aftercare was discussed.  Hazel Sams, PA-C

## 2019-08-31 ENCOUNTER — Other Ambulatory Visit: Payer: Self-pay | Admitting: Gastroenterology

## 2019-08-31 DIAGNOSIS — R1013 Epigastric pain: Secondary | ICD-10-CM

## 2019-09-12 ENCOUNTER — Ambulatory Visit (INDEPENDENT_AMBULATORY_CARE_PROVIDER_SITE_OTHER): Payer: Medicare Other | Admitting: Family Medicine

## 2019-09-12 ENCOUNTER — Ambulatory Visit
Admission: RE | Admit: 2019-09-12 | Discharge: 2019-09-12 | Disposition: A | Discharge status: Home / Self Care | Payer: Medicare Other | Source: Ambulatory Visit | Attending: Gastroenterology | Admitting: Gastroenterology

## 2019-09-12 ENCOUNTER — Other Ambulatory Visit: Payer: Self-pay

## 2019-09-12 ENCOUNTER — Encounter: Payer: Self-pay | Admitting: Family Medicine

## 2019-09-12 ENCOUNTER — Other Ambulatory Visit: Payer: Self-pay | Admitting: Gastroenterology

## 2019-09-12 VITALS — BP 130/92 | HR 86 | Temp 97.0°F | Resp 16 | Ht 64.0 in | Wt 203.0 lb

## 2019-09-12 DIAGNOSIS — I951 Orthostatic hypotension: Secondary | ICD-10-CM

## 2019-09-12 DIAGNOSIS — R1013 Epigastric pain: Secondary | ICD-10-CM

## 2019-09-12 MED ORDER — OXYCODONE-ACETAMINOPHEN 5-325 MG PO TABS: 1.0000 | ORAL_TABLET | ORAL | 0 refills | Status: DC | PRN

## 2019-09-12 MED ORDER — OMEPRAZOLE 40 MG PO CPDR: 40.0000 mg | DELAYED_RELEASE_CAPSULE | Freq: Two times a day (BID) | ORAL | 5 refills | Status: DC

## 2019-09-12 NOTE — Progress Notes (Signed)
Subjective:    Patient ID: Janet Peterson, female    DOB: Jun 30, 1950, 69 y.o.   MRN: [id number]  Medication Refill   Patient recently reports dizziness upon standing.  She states that she feels lightheaded when she stands up quickly.  At times she feels like she could pass out.  She denies any tachycardia or arrhythmias or palpitations.  She denies any chest pain or shortness of breath or dyspnea on exertion.  Here today with the patient lying supine to check her blood pressure and found to be 140/92.  When I had the patient sit up and rechecked her blood pressure, the blood pressure dropped to 120/80 and the patient felt lightheaded.  She had a negative Dix-Hallpike maneuver.  She denies any otalgia or sinus pain or congestion or tinnitus. Past Medical History:  Diagnosis Date  . Ankylosing spondylitis ([place])   . Anxiety and depression   . Depression   . Fibromyalgia   . GERD (gastroesophageal reflux disease)   . History of basal cell carcinoma excision    FACE, [place]  . History of hiatal hernia   . History of thrush   . Hyperlipidemia   . Hypertension   . Insomnia   . Left breast mass   . OCD (obsessive compulsive disorder)   . OSA (obstructive sleep apnea)    MILD AND NO CPAP SINCE SURGERY IN 2007  . Osteopenia   . PONV (postoperative nausea and vomiting)   . Psoriatic arthritis ([place])   . PVC (premature ventricular contraction)   . Rheumatoid arthritis [facility])    Past Surgical History:  Procedure Laterality Date  . BREAST LUMPECTOMY WITH RADIOACTIVE SEED LOCALIZATION Left 05/04/2018   Procedure: LEFT BREAST LUMPECTOMY WITH RADIOACTIVE SEED LOCALIZATION AND LEFT BREAST NIPPLE BIOPSY;  Surgeon: Jovita Kussmaul, MD;  Location: [facility];  Service: General;  Laterality: Left;  . BREAST SURGERY  1975   lumpectomy-- benign  . BUNIONECTOMY  1991  . CATARACT EXTRACTION W/ INTRAOCULAR LENS  IMPLANT, BILATERAL  2000  . CERVICAL FUSION  March 2013   C5 -- C7    . CESAREAN SECTION  1985  . DISTAL INTERPHALANGEAL JOINT FUSION Right 03/14/2015   Procedure: RIGHT LONG FINGER DISTAL INTERPHALANGEAL JOINT ARTHRODESIS;  Surgeon: Iran Planas, MD;  Location: [facility];  Service: Orthopedics;  Laterality: Right;  . EYE SURGERY  1995   rk (laser surgery), semi cornea transplant, detacted retina,  fluid removal  . KNEE ARTHROSCOPY Left 03-03-2004  . POSTERIOR VITRECTOMY RIGHT EYE AND LASER   10-27-1999  . SHOULDER SURGERY Right 1997  . SPINAL FIXATION SURGERY W/ IMPLANT  2013 rod #1//  2014  rod 2   S1 -- T10  (rod #1)//   S1 -- T4 (rod #2)  . THORACIC FUSION  03-13-2013   REMOVAL HARDWARE/  BONE GRAFT FUSION T10//  REVISION OF RODS  . TOTAL KNEE ARTHROPLASTY Left 12-14-2005  . UVULOPALATOPHARYNGOPLASTY  04-26-2006   w/  TONSILLECTOMY/  TURBINATE REDUCTIONS/  BILATERAL ANTERIOR ETHMOIDECTOMY   Current Outpatient Medications on File Prior to Visit  Medication Sig Dispense Refill  . amLODipine (NORVASC) 5 MG tablet Take 1 tablet (5 mg total) by mouth daily. 90 tablet 3  . buPROPion (WELLBUTRIN XL) 300 MG 24 hr tablet Take 300 mg by mouth every morning.  1  . clonazePAM (KLONOPIN) 1 MG tablet TAKE 1 TABLET BY MOUTH EVERY DAY AS NEEDED FOR ANXIETY 30 tablet 1  .  ezetimibe (ZETIA) 10 MG tablet TAKE 1 TABLET BY MOUTH EVERY DAY 90 tablet 3  . fluticasone (FLONASE) 50 MCG/ACT nasal spray Place 2 sprays into both nostrils as needed.     . fluvoxaMINE (LUVOX) 100 MG tablet Take 100 mg by mouth 2 (two) times daily.    . Golimumab ([place] ARIA IV) Inject into the vein every 8 (eight) weeks.    . metoprolol succinate (TOPROL-XL) 50 MG 24 hr tablet TAKE 1 TABLET BY MOUTH ONCE DAILY FOLLOWING A MEAL 90 tablet 3  . traZODone (DESYREL) 100 MG tablet Take 100 mg by mouth at bedtime as needed.     . valsartan (DIOVAN) 320 MG tablet TAKE 1 TABLET BY MOUTH EVERY DAY 90 tablet 3   No current facility-administered medications on file prior to visit.    Allergies  Allergen Reactions  . Latex Rash    And Mouth sores  . Amlodipine Besy-Benazepril Hcl Other (See Comments)    COUGH  . Augmentin [Amoxicillin-Pot Clavulanate] Diarrhea and Nausea And Vomiting  . Bextra [Valdecoxib] Diarrhea and Nausea And Vomiting    REFLUX  . Chlorhexidine     rash  . Lipitor [Atorvastatin Calcium] Other (See Comments)    MYALGIAS  . Sulfa Antibiotics Nausea And Vomiting    CRAMPING  . Zocor [Simvastatin] Other (See Comments)    MYALGIA   Social History   Socioeconomic History  . Marital status: Married    Spouse name: Not on file  . Number of children: Not on file  . Years of education: Not on file  . Highest education level: Not on file  Occupational History  . Not on file  Tobacco Use  . Smoking status: Former Smoker    Packs/day: 0.50    Years: 10.00    Pack years: 5.00    Types: Cigarettes    Quit date: 03/07/1995    Years since quitting: 24.5  . Smokeless tobacco: Never Used  Substance and Sexual Activity  . Alcohol use: Yes    Comment: rare  . Drug use: Never  . Sexual activity: Not on file  Other Topics Concern  . Not on file  Social History Narrative  . Not on file   Social Determinants of Health   Financial Resource Strain:   . Difficulty of Paying Living Expenses:   Food Insecurity:   . Worried About Charity fundraiser in the Last Year:   . Arboriculturist in the Last Year:   Transportation Needs:   . Film/video editor (Medical):   Marland Kitchen Lack of Transportation (Non-Medical):   Physical Activity:   . Days of Exercise per Week:   . Minutes of Exercise per Session:   Stress:   . Feeling of Stress :   Social Connections:   . Frequency of Communication with Friends and Family:   . Frequency of Social Gatherings with Friends and Family:   . Attends Religious Services:   . Active Member of Clubs or Organizations:   . Attends Archivist Meetings:   Marland Kitchen Marital Status:   Intimate Partner Violence:   .  Fear of Current or Ex-Partner:   . Emotionally Abused:   Marland Kitchen Physically Abused:   . Sexually Abused:       Review of Systems  All other systems reviewed and are negative.      Objective:   Physical Exam  Constitutional: She appears well-developed and well-nourished. No distress.  Cardiovascular: Normal rate, regular rhythm and normal  heart sounds.  No murmur heard. Pulmonary/Chest: Effort normal and breath sounds normal. No respiratory distress. She has no wheezes. She has no rales.  Abdominal: Soft. Bowel sounds are normal. She exhibits no distension. There is no abdominal tenderness. There is no rebound and no guarding.  Musculoskeletal:        General: No edema.     Cervical back: Neck supple.     Thoracic back: Pain present. Decreased range of motion.     Lumbar back: Pain present. No swelling, edema, deformity, spasms, tenderness or bony tenderness. Decreased range of motion.  Skin: She is not diaphoretic.  Vitals reviewed.         Assessment & Plan:  Orthostatic hypotension  Discontinue amlodipine and recheck blood pressure in 2 weeks.  Monitor for symptom improvement.

## 2019-09-28 ENCOUNTER — Inpatient Hospital Stay (HOSPITAL_COMMUNITY)
Admission: EM | Admit: 2019-09-28 | Discharge: 2019-10-01 | DRG: 372 | Disposition: A | Discharge status: Home / Self Care | Payer: Medicare Other | Attending: Internal Medicine | Admitting: Internal Medicine

## 2019-09-28 ENCOUNTER — Emergency Department (HOSPITAL_COMMUNITY): Payer: Medicare Other

## 2019-09-28 ENCOUNTER — Inpatient Hospital Stay (HOSPITAL_COMMUNITY): Admission: RE | Admit: 2019-09-28 | Payer: Medicare Other | Source: Ambulatory Visit

## 2019-09-28 ENCOUNTER — Encounter (HOSPITAL_COMMUNITY): Payer: Self-pay | Admitting: Emergency Medicine

## 2019-09-28 ENCOUNTER — Other Ambulatory Visit: Payer: Self-pay

## 2019-09-28 DIAGNOSIS — A0472 Enterocolitis due to Clostridium difficile, not specified as recurrent: Secondary | ICD-10-CM | POA: Diagnosis present

## 2019-09-28 DIAGNOSIS — Z888 Allergy status to other drugs, medicaments and biological substances status: Secondary | ICD-10-CM | POA: Diagnosis not present

## 2019-09-28 DIAGNOSIS — K633 Ulcer of intestine: Secondary | ICD-10-CM | POA: Diagnosis present

## 2019-09-28 DIAGNOSIS — Z87891 Personal history of nicotine dependence: Secondary | ICD-10-CM

## 2019-09-28 DIAGNOSIS — G4733 Obstructive sleep apnea (adult) (pediatric): Secondary | ICD-10-CM | POA: Diagnosis present

## 2019-09-28 DIAGNOSIS — K317 Polyp of stomach and duodenum: Secondary | ICD-10-CM | POA: Diagnosis present

## 2019-09-28 DIAGNOSIS — K222 Esophageal obstruction: Secondary | ICD-10-CM | POA: Diagnosis present

## 2019-09-28 DIAGNOSIS — M797 Fibromyalgia: Secondary | ICD-10-CM | POA: Diagnosis present

## 2019-09-28 DIAGNOSIS — K760 Fatty (change of) liver, not elsewhere classified: Secondary | ICD-10-CM | POA: Diagnosis present

## 2019-09-28 DIAGNOSIS — I10 Essential (primary) hypertension: Secondary | ICD-10-CM | POA: Diagnosis present

## 2019-09-28 DIAGNOSIS — Z86010 Personal history of colonic polyps: Secondary | ICD-10-CM

## 2019-09-28 DIAGNOSIS — Z20822 Contact with and (suspected) exposure to covid-19: Secondary | ICD-10-CM | POA: Diagnosis present

## 2019-09-28 DIAGNOSIS — Z882 Allergy status to sulfonamides status: Secondary | ICD-10-CM

## 2019-09-28 DIAGNOSIS — Z8249 Family history of ischemic heart disease and other diseases of the circulatory system: Secondary | ICD-10-CM

## 2019-09-28 DIAGNOSIS — Z791 Long term (current) use of non-steroidal anti-inflammatories (NSAID): Secondary | ICD-10-CM

## 2019-09-28 DIAGNOSIS — M069 Rheumatoid arthritis, unspecified: Secondary | ICD-10-CM | POA: Diagnosis present

## 2019-09-28 DIAGNOSIS — K529 Noninfective gastroenteritis and colitis, unspecified: Secondary | ICD-10-CM | POA: Diagnosis present

## 2019-09-28 DIAGNOSIS — Z91040 Latex allergy status: Secondary | ICD-10-CM | POA: Diagnosis not present

## 2019-09-28 DIAGNOSIS — K922 Gastrointestinal hemorrhage, unspecified: Secondary | ICD-10-CM

## 2019-09-28 DIAGNOSIS — E7800 Pure hypercholesterolemia, unspecified: Secondary | ICD-10-CM | POA: Diagnosis not present

## 2019-09-28 DIAGNOSIS — K219 Gastro-esophageal reflux disease without esophagitis: Secondary | ICD-10-CM | POA: Diagnosis present

## 2019-09-28 DIAGNOSIS — Z981 Arthrodesis status: Secondary | ICD-10-CM

## 2019-09-28 DIAGNOSIS — M8580 Other specified disorders of bone density and structure, unspecified site: Secondary | ICD-10-CM | POA: Diagnosis present

## 2019-09-28 DIAGNOSIS — Z9842 Cataract extraction status, left eye: Secondary | ICD-10-CM

## 2019-09-28 DIAGNOSIS — Z947 Corneal transplant status: Secondary | ICD-10-CM

## 2019-09-28 DIAGNOSIS — Z79899 Other long term (current) drug therapy: Secondary | ICD-10-CM

## 2019-09-28 DIAGNOSIS — Z96651 Presence of right artificial knee joint: Secondary | ICD-10-CM | POA: Diagnosis present

## 2019-09-28 DIAGNOSIS — Z9841 Cataract extraction status, right eye: Secondary | ICD-10-CM

## 2019-09-28 DIAGNOSIS — F329 Major depressive disorder, single episode, unspecified: Secondary | ICD-10-CM | POA: Diagnosis present

## 2019-09-28 DIAGNOSIS — E785 Hyperlipidemia, unspecified: Secondary | ICD-10-CM | POA: Diagnosis present

## 2019-09-28 DIAGNOSIS — L4050 Arthropathic psoriasis, unspecified: Secondary | ICD-10-CM | POA: Diagnosis present

## 2019-09-28 DIAGNOSIS — E876 Hypokalemia: Secondary | ICD-10-CM | POA: Diagnosis present

## 2019-09-28 DIAGNOSIS — K2950 Unspecified chronic gastritis without bleeding: Secondary | ICD-10-CM | POA: Diagnosis present

## 2019-09-28 DIAGNOSIS — K449 Diaphragmatic hernia without obstruction or gangrene: Secondary | ICD-10-CM | POA: Diagnosis present

## 2019-09-28 DIAGNOSIS — F419 Anxiety disorder, unspecified: Secondary | ICD-10-CM | POA: Diagnosis present

## 2019-09-28 DIAGNOSIS — Z85828 Personal history of other malignant neoplasm of skin: Secondary | ICD-10-CM

## 2019-09-28 DIAGNOSIS — D649 Anemia, unspecified: Secondary | ICD-10-CM | POA: Diagnosis present

## 2019-09-28 DIAGNOSIS — G43909 Migraine, unspecified, not intractable, without status migrainosus: Secondary | ICD-10-CM | POA: Diagnosis present

## 2019-09-28 DIAGNOSIS — M1990 Unspecified osteoarthritis, unspecified site: Secondary | ICD-10-CM | POA: Diagnosis present

## 2019-09-28 DIAGNOSIS — F32A Depression, unspecified: Secondary | ICD-10-CM

## 2019-09-28 DIAGNOSIS — Z833 Family history of diabetes mellitus: Secondary | ICD-10-CM

## 2019-09-28 DIAGNOSIS — Z79891 Long term (current) use of opiate analgesic: Secondary | ICD-10-CM

## 2019-09-28 LAB — CBC
HCT: 39.2 % (ref 36.0–46.0)
Hemoglobin: 13.2 g/dL (ref 12.0–15.0)
MCH: 32.4 pg (ref 26.0–34.0)
MCHC: 33.7 g/dL (ref 30.0–36.0)
MCV: 96.1 fL (ref 80.0–100.0)
Platelets: 240 10*3/uL (ref 150–400)
RBC: 4.08 MIL/uL (ref 3.87–5.11)
RDW: 14.1 % (ref 11.5–15.5)
WBC: 7.7 10*3/uL (ref 4.0–10.5)
nRBC: 0 % (ref 0.0–0.2)

## 2019-09-28 LAB — COMPREHENSIVE METABOLIC PANEL
ALT: 14 U/L (ref 0–44)
AST: 19 U/L (ref 15–41)
Albumin: 3.9 g/dL (ref 3.5–5.0)
Alkaline Phosphatase: 94 U/L (ref 38–126)
Anion gap: 10 (ref 5–15)
BUN: 10 mg/dL (ref 8–23)
CO2: 25 mmol/L (ref 22–32)
Calcium: 10 mg/dL (ref 8.9–10.3)
Chloride: 101 mmol/L (ref 98–111)
Creatinine, Ser: 0.84 mg/dL (ref 0.44–1.00)
GFR calc Af Amer: >60 mL/min (ref >60)
GFR calc non Af Amer: >60 mL/min (ref >60)
Glucose, Bld: 122 mg/dL — ABNORMAL HIGH (ref 70–99)
Potassium: 3.7 mmol/L (ref 3.5–5.1)
Sodium: 136 mmol/L (ref 135–145)
Total Bilirubin: 0.5 mg/dL (ref 0.3–1.2)
Total Protein: 6.8 g/dL (ref 6.5–8.1)

## 2019-09-28 LAB — URINALYSIS, ROUTINE W REFLEX MICROSCOPIC
Bilirubin Urine: NEGATIVE
Glucose, UA: NEGATIVE mg/dL
Hgb urine dipstick: NEGATIVE
Ketones, ur: NEGATIVE mg/dL
Leukocytes,Ua: NEGATIVE
Nitrite: NEGATIVE
Protein, ur: NEGATIVE mg/dL
Specific Gravity, Urine: 1.014 (ref 1.005–1.030)
pH: 6 (ref 5.0–8.0)

## 2019-09-28 LAB — LIPASE, BLOOD: Lipase: 21 U/L (ref 11–51)

## 2019-09-28 LAB — RESPIRATORY PANEL BY RT PCR (FLU A&B, COVID)
Influenza A by PCR: NEGATIVE
Influenza B by PCR: NEGATIVE
SARS Coronavirus 2 by RT PCR: NEGATIVE

## 2019-09-28 MED ORDER — FLUVOXAMINE MALEATE 50 MG PO TABS
100.0000 mg | ORAL_TABLET | Freq: Two times a day (BID) | ORAL | Status: DC
  Administered 2019-09-29 – 2019-10-01 (×5): 100 mg via ORAL
  Filled 2019-09-28 (×7): qty 2

## 2019-09-28 MED ORDER — SODIUM CHLORIDE 0.9 % IV SOLN: INTRAVENOUS | Status: DC

## 2019-09-28 MED ORDER — FENTANYL CITRATE (PF) 100 MCG/2ML IJ SOLN
50.0000 ug | Freq: Once | INTRAMUSCULAR | Status: AC
  Administered 2019-09-28: 50 ug via INTRAVENOUS
  Filled 2019-09-28: qty 2

## 2019-09-28 MED ORDER — PANTOPRAZOLE SODIUM 40 MG PO TBEC
40.0000 mg | DELAYED_RELEASE_TABLET | Freq: Two times a day (BID) | ORAL | Status: DC
  Administered 2019-09-29: 40 mg via ORAL
  Filled 2019-09-28: qty 1

## 2019-09-28 MED ORDER — SODIUM CHLORIDE (PF) 0.9 % IJ SOLN
INTRAMUSCULAR | Status: AC
  Filled 2019-09-28: qty 50

## 2019-09-28 MED ORDER — CLONAZEPAM 1 MG PO TABS: 1.0000 mg | ORAL_TABLET | Freq: Every day | ORAL | Status: DC | PRN

## 2019-09-28 MED ORDER — METRONIDAZOLE IN NACL 5-0.79 MG/ML-% IV SOLN
500.0000 mg | Freq: Three times a day (TID) | INTRAVENOUS | Status: DC
  Administered 2019-09-29 (×3): 500 mg via INTRAVENOUS
  Filled 2019-09-28 (×3): qty 100

## 2019-09-28 MED ORDER — FLUTICASONE PROPIONATE 50 MCG/ACT NA SUSP
2.0000 | Freq: Every day | NASAL | Status: DC | PRN
  Filled 2019-09-28: qty 16

## 2019-09-28 MED ORDER — PEG 3350-KCL-NA BICARB-NACL 420 G PO SOLR
500.0000 mL | Freq: Once | ORAL | Status: AC
  Administered 2019-09-29: 500 mL via ORAL
  Filled 2019-09-28: qty 4000

## 2019-09-28 MED ORDER — ONDANSETRON HCL 4 MG/2ML IJ SOLN
4.0000 mg | Freq: Once | INTRAMUSCULAR | Status: AC
  Administered 2019-09-28: 4 mg via INTRAVENOUS
  Filled 2019-09-28: qty 2

## 2019-09-28 MED ORDER — ACETAMINOPHEN 650 MG RE SUPP: 650.0000 mg | Freq: Four times a day (QID) | RECTAL | Status: DC | PRN

## 2019-09-28 MED ORDER — IOHEXOL 300 MG/ML  SOLN
100.0000 mL | Freq: Once | INTRAMUSCULAR | Status: AC | PRN
  Administered 2019-09-28: 100 mL via INTRAVENOUS

## 2019-09-28 MED ORDER — EZETIMIBE 10 MG PO TABS
10.0000 mg | ORAL_TABLET | Freq: Every day | ORAL | Status: DC
  Administered 2019-09-29 – 2019-10-01 (×3): 10 mg via ORAL
  Filled 2019-09-28 (×5): qty 1

## 2019-09-28 MED ORDER — ACETAMINOPHEN 325 MG PO TABS
650.0000 mg | ORAL_TABLET | Freq: Four times a day (QID) | ORAL | Status: DC | PRN
  Administered 2019-09-29 – 2019-10-01 (×2): 650 mg via ORAL
  Filled 2019-09-28 (×2): qty 2

## 2019-09-28 MED ORDER — BUPROPION HCL ER (XL) 300 MG PO TB24
300.0000 mg | ORAL_TABLET | Freq: Every morning | ORAL | Status: DC
  Administered 2019-09-29 – 2019-10-01 (×3): 300 mg via ORAL
  Filled 2019-09-28 (×3): qty 1

## 2019-09-28 MED ORDER — MORPHINE SULFATE (PF) 2 MG/ML IV SOLN
1.0000 mg | INTRAVENOUS | Status: DC | PRN
  Administered 2019-09-29 (×3): 1 mg via INTRAVENOUS
  Filled 2019-09-28 (×3): qty 1

## 2019-09-28 MED ORDER — ONDANSETRON HCL 4 MG/2ML IJ SOLN
4.0000 mg | Freq: Four times a day (QID) | INTRAMUSCULAR | Status: DC | PRN
  Administered 2019-09-29 (×3): 4 mg via INTRAVENOUS
  Filled 2019-09-28 (×3): qty 2

## 2019-09-28 MED ORDER — SODIUM CHLORIDE 0.9 % IV SOLN
2.0000 g | Freq: Every day | INTRAVENOUS | Status: DC
  Administered 2019-09-29: 2 g via INTRAVENOUS
  Filled 2019-09-28: qty 2

## 2019-09-28 MED ORDER — LACTATED RINGERS IV BOLUS
1000.0000 mL | Freq: Once | INTRAVENOUS | Status: AC
  Administered 2019-09-28: 1000 mL via INTRAVENOUS

## 2019-09-28 NOTE — ED Triage Notes (Signed)
Patient reports N/V/D intermittently x3 months. Today, c/o bright red blood in stool since last night. Reports recent EGD with hernia discovery. Followed by gastroenterology.

## 2019-09-28 NOTE — H&P (Signed)
History and Physical    Aryiel Coor [id number] DOB: Nov 28, 1950 DOA: 09/28/2019  PCP: Susy Frizzle, MD Patient coming from: Home  Chief Complaint: Abdominal pain, rectal bleeding  HPI: Janet Peterson is a 69 y.o. female with medical history significant of psoriatic arthritis, osteoarthritis, DDD, fibromyalgia, GERD, OSA not on CPAP, anxiety, depression, OCD presenting with complaints of abdominal pain and rectal bleeding.  Patient reports 2-1/38-month history of nausea, epigastric abdominal pain, and diarrhea.  Reports having acid reflux which has been worse for the past 1 month.  States she was seen by her gastroenterologist a month ago and told everything was fine.  The dose of her home Prilosec has been increased to twice daily by her gastroenterologist.  States her last colonoscopy was 3 years ago and she had a polyp at that time which was removed.  She is not sure if she has ever had an endoscopy done.  She has vomited twice in the past few days.  Last night she had an episode of bloody diarrhea.  In the morning she had another episode of bloody diarrhea.  No further episodes since then.  Denies fevers or chills.  She has already received both of her Covid vaccines.  She feels short of breath sometimes but no recent change.  Denies cough.  Denies chest pain.  ED Course: Hemodynamically stable.  Labs showing no leukocytosis.  Hemoglobin normal at 13.2.  FOBT pending.  Lipase and LFTs normal.  UA not suggestive of infection.  SARS-CoV-2 PCR test negative. CT abdomen pelvis with contrast showing mild changes of colitis involving the descending and sigmoid colon, most likely infectious or inflammatory in nature.  No CT findings to indicate an ischemic cause.  Minimal diffuse hepatic steatosis.  Stable left adrenal myelolipoma. ED provider discussed the case with Dr. Cristina Gong from GI who is planning for colonoscopy in the morning.  Recommended keeping the patient n.p.o. after midnight and  giving bowel prep.  Patient was given fentanyl, Zofran, GoLYTELY, and 1 L LR bolus.  Review of Systems:  All systems reviewed and apart from history of presenting illness, are negative.  Past Medical History:  Diagnosis Date  . Ankylosing spondylitis ([place])   . Anxiety and depression   . Depression   . Fibromyalgia   . GERD (gastroesophageal reflux disease)   . History of basal cell carcinoma excision    FACE, [place]  . History of hiatal hernia   . History of thrush   . Hyperlipidemia   . Hypertension   . Insomnia   . Left breast mass   . OCD (obsessive compulsive disorder)   . OSA (obstructive sleep apnea)    MILD AND NO CPAP SINCE SURGERY IN 2007  . Osteopenia   . PONV (postoperative nausea and vomiting)   . Psoriatic arthritis ([place])   . PVC (premature ventricular contraction)   . Rheumatoid arthritis [facility])     Past Surgical History:  Procedure Laterality Date  . BREAST LUMPECTOMY WITH RADIOACTIVE SEED LOCALIZATION Left 05/04/2018   Procedure: LEFT BREAST LUMPECTOMY WITH RADIOACTIVE SEED LOCALIZATION AND LEFT BREAST NIPPLE BIOPSY;  Surgeon: Jovita Kussmaul, MD;  Location: [facility];  Service: General;  Laterality: Left;  . BREAST SURGERY  1975   lumpectomy-- benign  . BUNIONECTOMY  1991  . CATARACT EXTRACTION W/ INTRAOCULAR LENS  IMPLANT, BILATERAL  2000  . CERVICAL FUSION  March 2013   C5 -- C7  . CESAREAN SECTION  1985  .  DISTAL INTERPHALANGEAL JOINT FUSION Right 03/14/2015   Procedure: RIGHT LONG FINGER DISTAL INTERPHALANGEAL JOINT ARTHRODESIS;  Surgeon: Iran Planas, MD;  Location: [facility];  Service: Orthopedics;  Laterality: Right;  . EYE SURGERY  1995   rk (laser surgery), semi cornea transplant, detacted retina,  fluid removal  . KNEE ARTHROSCOPY Left 03-03-2004  . POSTERIOR VITRECTOMY RIGHT EYE AND LASER   10-27-1999  . SHOULDER SURGERY Right 1997  . SPINAL FIXATION SURGERY W/ IMPLANT  2013 rod #1//  2014  rod 2   S1  -- T10  (rod #1)//   S1 -- T4 (rod #2)  . THORACIC FUSION  03-13-2013   REMOVAL HARDWARE/  BONE GRAFT FUSION T10//  REVISION OF RODS  . TOTAL KNEE ARTHROPLASTY Left 12-14-2005  . UVULOPALATOPHARYNGOPLASTY  04-26-2006   w/  TONSILLECTOMY/  TURBINATE REDUCTIONS/  BILATERAL ANTERIOR ETHMOIDECTOMY     reports that she quit smoking about 24 years ago. Her smoking use included cigarettes. She has a 5.00 pack-year smoking history. She has never used smokeless tobacco. She reports current alcohol use. She reports that she does not use drugs.  Allergies  Allergen Reactions  . Latex Rash    And Mouth sores  . Amlodipine Besy-Benazepril Hcl Other (See Comments)    COUGH  . Augmentin [Amoxicillin-Pot Clavulanate] Diarrhea and Nausea And Vomiting  . Bextra [Valdecoxib] Diarrhea and Nausea And Vomiting    REFLUX  . Chlorhexidine     rash  . Lipitor [Atorvastatin Calcium] Other (See Comments)    MYALGIAS  . Sulfa Antibiotics Nausea And Vomiting    CRAMPING  . Zocor [Simvastatin] Other (See Comments)    MYALGIA    Family History  Problem Relation Age of Onset  . Diabetes Mother   . Heart disease Mother   . Diabetes Father   . Anuerysm Father   . Diabetes Brother   . Heart disease Brother   . Diabetes Brother   . Heart disease Brother     Prior to Admission medications   Medication Sig Start Date End Date Taking? Authorizing Provider  buPROPion (WELLBUTRIN XL) 300 MG 24 hr tablet Take 300 mg by mouth every morning. 04/07/17  Yes [provider]  clonazePAM (KLONOPIN) 1 MG tablet TAKE 1 TABLET BY MOUTH EVERY DAY AS NEEDED FOR ANXIETY Patient taking differently: Take 1 mg by mouth daily as needed for anxiety.  07/07/19  Yes Susy Frizzle, MD  ezetimibe (ZETIA) 10 MG tablet TAKE 1 TABLET BY MOUTH EVERY DAY Patient taking differently: Take 10 mg by mouth daily after breakfast.  07/17/19  Yes Susy Frizzle, MD  fluticasone (FLONASE) 50 MCG/ACT nasal spray Place 2 sprays into  both nostrils daily as needed for allergies.    Yes [provider]  fluvoxaMINE (LUVOX) 100 MG tablet Take 100 mg by mouth 2 (two) times daily. 07/19/19  Yes [provider]  Golimumab ([place] ARIA IV) Inject into the vein every 8 (eight) weeks.   Yes [provider]  metoprolol succinate (TOPROL-XL) 50 MG 24 hr tablet TAKE 1 TABLET BY MOUTH ONCE DAILY FOLLOWING A MEAL Patient taking differently: Take 50 mg by mouth daily after breakfast.  11/30/18  Yes Susy Frizzle, MD  naproxen sodium (ALEVE) 220 MG tablet Take 440 mg by mouth 2 (two) times daily as needed (pain).   Yes [provider]  omeprazole (PRILOSEC) 40 MG capsule Take 1 capsule (40 mg total) by mouth in the morning and at bedtime. 09/12/19  Yes Susy Frizzle, MD  oxyCODONE-acetaminophen (PERCOCET) 5-325 MG tablet Take 1 tablet by mouth every 4 (four) hours as needed. Patient taking differently: Take 1 tablet by mouth at bedtime.  09/12/19  Yes Susy Frizzle, MD  Probiotic Product (PROBIOTIC PO) Take 1 tablet by mouth daily after breakfast.   Yes [provider]  valsartan (DIOVAN) 320 MG tablet TAKE 1 TABLET BY MOUTH EVERY DAY Patient taking differently: Take 320 mg by mouth daily after breakfast.  05/15/19  Yes Susy Frizzle, MD  amLODipine (NORVASC) 5 MG tablet Take 1 tablet (5 mg total) by mouth daily. Patient not taking: Reported on 09/28/2019 09/29/18   Susy Frizzle, MD    Physical Exam: Vitals:   09/28/19 1815 09/28/19 2000 09/28/19 2045 09/28/19 2315  BP: (!) 150/80 (!) 150/80 (!) 153/85 (!) 145/75  Pulse: 84 86 84 88  Resp: 13 18 (!) 23 18  Temp:      SpO2: 95% 98% 100% 98%  Weight:      Height:        Physical Exam  Constitutional: She is oriented to person, place, and time. She appears well-developed and well-nourished. No distress.  HENT:  Head: Normocephalic.  Eyes: Right eye exhibits no discharge. Left eye exhibits no discharge.  Cardiovascular:  Normal rate, regular rhythm and intact distal pulses.  Pulmonary/Chest: Effort normal and breath sounds normal. No respiratory distress. She has no wheezes. She has no rales.  Abdominal: Soft. Bowel sounds are normal. She exhibits no distension. There is abdominal tenderness. There is guarding. There is no rebound.  Generalized tenderness to palpation with guarding  Musculoskeletal:        General: No edema.     Cervical back: Neck supple.  Neurological: She is alert and oriented to person, place, and time.  Skin: Skin is warm and dry. She is not diaphoretic.    Labs on Admission: I have personally reviewed following labs and imaging studies  CBC: Recent Labs  Lab 09/28/19 1351  WBC 7.7  HGB 13.2  HCT 39.2  MCV 96.1  PLT [id number]   Basic Metabolic Panel: Recent Labs  Lab 09/28/19 1351  NA 136  K 3.7  CL 101  CO2 25  GLUCOSE 122*  BUN 10  CREATININE 0.84  CALCIUM 10.0   GFR: Estimated Creatinine Clearance: 69 mL/min (by C-G formula based on SCr of 0.84 mg/dL). Liver Function Tests: Recent Labs  Lab 09/28/19 1351  AST 19  ALT 14  ALKPHOS 94  BILITOT 0.5  PROT 6.8  ALBUMIN 3.9   Recent Labs  Lab 09/28/19 1351  LIPASE 21   No results for input(s): AMMONIA in the last 168 hours. Coagulation Profile: No results for input(s): INR, PROTIME in the last 168 hours. Cardiac Enzymes: No results for input(s): CKTOTAL, CKMB, CKMBINDEX, TROPONINI in the last 168 hours. BNP (last 3 results) No results for input(s): PROBNP in the last 8760 hours. HbA1C: No results for input(s): HGBA1C in the last 72 hours. CBG: No results for input(s): GLUCAP in the last 168 hours. Lipid Profile: No results for input(s): CHOL, HDL, LDLCALC, TRIG, CHOLHDL, LDLDIRECT in the last 72 hours. Thyroid Function Tests: No results for input(s): TSH, T4TOTAL, FREET4, T3FREE, THYROIDAB in the last 72 hours. Anemia Panel: No results for input(s): VITAMINB12, FOLATE, FERRITIN, TIBC, IRON, RETICCTPCT  in the last 72 hours. Urine analysis:    Component Value Date/Time   COLORURINE YELLOW 09/28/2019 1940   APPEARANCEUR CLEAR 09/28/2019 1940  LABSPEC 1.014 09/28/2019 1940   PHURINE 6.0 09/28/2019 1940   GLUCOSEU NEGATIVE 09/28/2019 1940   HGBUR NEGATIVE 09/28/2019 1940   BILIRUBINUR NEGATIVE 09/28/2019 1940   KETONESUR NEGATIVE 09/28/2019 1940   PROTEINUR NEGATIVE 09/28/2019 1940   UROBILINOGEN 0.2 08/12/2012 1451   NITRITE NEGATIVE 09/28/2019 1940   LEUKOCYTESUR NEGATIVE 09/28/2019 1940    Radiological Exams on Admission: CT ABDOMEN PELVIS W CONTRAST  Result Date: 09/28/2019 CLINICAL DATA:  Nonlocalized upper and mid abdominal pain for the past 2 months. Intermittent nausea, vomiting and diarrhea for the past 3 months. Bright red blood in the stool since last night. EXAM: CT ABDOMEN AND PELVIS WITH CONTRAST TECHNIQUE: Multidetector CT imaging of the abdomen and pelvis was performed using the standard protocol following bolus administration of intravenous contrast. CONTRAST:  122mL OMNIPAQUE IOHEXOL 300 MG/ML  SOLN COMPARISON:  06/28/2013. FINDINGS: Lower chest: Minimal linear scarring/atelectasis at both lung bases. Normal sized heart. Hepatobiliary: Minimal diffuse low density of the liver relative to the spleen. Normal appearing gallbladder. Pancreas: Unremarkable. No pancreatic ductal dilatation or surrounding inflammatory changes. Spleen: Normal in size without focal abnormality. Adrenals/Urinary Tract: Stable 2.6 cm fat density left adrenal mass. Normal appearing right adrenal gland. Mild bowel rotation of the right kidney. Normal appearing left kidney, ureters and urinary bladder. Stomach/Bowel: Mild diffuse low density wall thickening involving the descending and sigmoid colon. Minimal associated pericolonic soft tissue stranding. Unremarkable stomach, small bowel and appendix. Vascular/Lymphatic: Atheromatous arterial calcifications without aneurysm. No enlarged lymph nodes.  Reproductive: Uterus and bilateral adnexa are unremarkable. Other: Small umbilical hernia containing fat. Musculoskeletal: Screw and rod fixation from the lower thoracic spine to the upper sacrum. Old, healed burst fracture of the T10 vertebral body with approximately 40% compression. IMPRESSION: 1. Mild changes of colitis involving the descending and sigmoid colon. This is most likely infectious or inflammatory in nature. There are no CT findings to indicate and ischemic cause. 2. Minimal diffuse hepatic steatosis. 3. Stable left adrenal myelolipoma. Electronically Signed   By: Claudie Revering M.D.   On: 09/28/2019 18:51    EKG: Independently reviewed.  Sinus rhythm.  Diffuse T wave abnormalities similar to prior tracing from December 2019.  Assessment/Plan Principal Problem:   Colitis Active Problems:   GERD (gastroesophageal reflux disease)   Psoriatic arthritis (H[facility])   HLD (hyperlipidemia)   Anxiety and depression   Infectious versus inflammatory colitis: Presenting with complaints of abdominal pain, nausea, vomiting, and bloody diarrhea.  Hemodynamically stable.  No leukocytosis.  Hemoglobin stable. CT abdomen pelvis with contrast showing mild changes of colitis involving the descending and sigmoid colon, most likely infectious or inflammatory in nature.  No CT findings to indicate an ischemic cause.  GI is planning for colonoscopy in the morning. -Keep n.p.o. after midnight.  Bowel prep was given in the ED.  Start ceftriaxone and metronidazole.  IV fluid hydration.  Morphine as needed for pain.  Antiemetic as needed.  C. difficile PCR and GI pathogen panel ordered.  Follow enteric precautions.  Check lactic acid level.  GERD: Continue PPI  Anxiety and depression: Continue home meds  Hyperlipidemia: Continue home Zetia  Psoriatic arthritis: Stable.  On Simponi Aria IV infusions every 8 weeks.  Please ensure outpatient follow-up with rheumatology.  DVT prophylaxis: SCDs Code Status:  Patient wishes to be full code. Family Communication: No family available at this time. Disposition Plan: Status is: Inpatient  Remains inpatient appropriate because:Ongoing diagnostic testing needed not appropriate for outpatient work up and IV treatments appropriate due  to intensity of illness or inability to take PO   Dispo: The patient is from: Home              Anticipated d/c is to: Home              Anticipated d/c date is: 2 days              Patient currently is not medically stable to d/c.  Consults called: Gastroenterology  The medical decision making on this patient was of high complexity and the patient is at high risk for clinical deterioration, therefore this is a level 3 visit.  Shela Leff MD [facility]  If 7PM-7AM, please contact night-coverage [email or web address]  09/28/2019, 11:27 PM

## 2019-09-28 NOTE — ED Notes (Addendum)
Unable to re collect sample at this time due to lack of stool. MD made aware.

## 2019-09-28 NOTE — ED Notes (Signed)
ED TO INPATIENT HANDOFF REPORT  ED Nurse Name and Phone #: jon wled   S Name/Age/Gender Janet Peterson 69 y.o. female Room/Bed: WA09/WA09  Code Status   Code Status: Full Code  Home/SNF/Other Home Patient oriented to: self, place, time and situation Is this baseline? Yes   Triage Complete: Triage complete  Chief Complaint Colitis [K52.9]  Triage Note Patient reports N/V/D intermittently x3 months. Today, c/o bright red blood in stool since last night. Reports recent EGD with hernia discovery. Followed by gastroenterology.    Allergies Allergies  Allergen Reactions  . Latex Rash    And Mouth sores  . Amlodipine Besy-Benazepril Hcl Other (See Comments)    COUGH  . Augmentin [Amoxicillin-Pot Clavulanate] Diarrhea and Nausea And Vomiting  . Bextra [Valdecoxib] Diarrhea and Nausea And Vomiting    REFLUX  . Chlorhexidine     rash  . Lipitor [Atorvastatin Calcium] Other (See Comments)    MYALGIAS  . Sulfa Antibiotics Nausea And Vomiting    CRAMPING  . Zocor [Simvastatin] Other (See Comments)    MYALGIA    Level of Care/Admitting Diagnosis ED Disposition    ED Disposition Condition [facility] Area: [place] [[id number]]  Level of Care: Med-Surg [16]  May admit patient to [facility] or [facility] if equivalent level of care is available:: Yes  Covid Evaluation: Asymptomatic Screening Protocol (No Symptoms)  Diagnosis: Colitis [id number]  Admitting Physician: Shela Leff [phone]  Attending Physician: Shela Leff [phone]  Estimated length of stay: past midnight tomorrow  Certification:: I certify this patient will need inpatient services for at least 2 midnights       B Medical/Surgery History Past Medical History:  Diagnosis Date  . Ankylosing spondylitis ([place])   . Anxiety and depression   . Depression   . Fibromyalgia   . GERD (gastroesophageal reflux disease)   . History of basal cell carcinoma excision     FACE, [place]  . History of hiatal hernia   . History of thrush   . Hyperlipidemia   . Hypertension   . Insomnia   . Left breast mass   . OCD (obsessive compulsive disorder)   . OSA (obstructive sleep apnea)    MILD AND NO CPAP SINCE SURGERY IN 2007  . Osteopenia   . PONV (postoperative nausea and vomiting)   . Psoriatic arthritis ([place])   . PVC (premature ventricular contraction)   . Rheumatoid arthritis [facility])    Past Surgical History:  Procedure Laterality Date  . BREAST LUMPECTOMY WITH RADIOACTIVE SEED LOCALIZATION Left 05/04/2018   Procedure: LEFT BREAST LUMPECTOMY WITH RADIOACTIVE SEED LOCALIZATION AND LEFT BREAST NIPPLE BIOPSY;  Surgeon: Jovita Kussmaul, MD;  Location: [place];  Service: General;  Laterality: Left;  . BREAST SURGERY  1975   lumpectomy-- benign  . BUNIONECTOMY  1991  . CATARACT EXTRACTION W/ INTRAOCULAR LENS  IMPLANT, BILATERAL  2000  . CERVICAL FUSION  March 2013   C5 -- C7  . CESAREAN SECTION  1985  . DISTAL INTERPHALANGEAL JOINT FUSION Right 03/14/2015   Procedure: RIGHT LONG FINGER DISTAL INTERPHALANGEAL JOINT ARTHRODESIS;  Surgeon: Iran Planas, MD;  Location: [place];  Service: Orthopedics;  Laterality: Right;  . EYE SURGERY  1995   rk (laser surgery), semi cornea transplant, detacted retina,  fluid removal  . KNEE ARTHROSCOPY Left 03-03-2004  . POSTERIOR VITRECTOMY RIGHT EYE AND LASER   10-27-1999  . SHOULDER SURGERY  Right 1997  . SPINAL FIXATION SURGERY W/ IMPLANT  2013 rod #1//  2014  rod 2   S1 -- T10  (rod #1)//   S1 -- T4 (rod #2)  . THORACIC FUSION  03-13-2013   REMOVAL HARDWARE/  BONE GRAFT FUSION T10//  REVISION OF RODS  . TOTAL KNEE ARTHROPLASTY Left 12-14-2005  . UVULOPALATOPHARYNGOPLASTY  04-26-2006   w/  TONSILLECTOMY/  TURBINATE REDUCTIONS/  BILATERAL ANTERIOR ETHMOIDECTOMY     A IV Location/Drains/Wounds Patient Lines/Drains/Airways Status   Active Line/Drains/Airways    Name:    Placement date:   Placement time:   Site:   Days:   Peripheral IV 09/28/19 Right Antecubital   09/28/19    1714    Antecubital   less than 1   Incision (Closed) 03/14/15 Hand   03/14/15    1401     1659   Incision (Closed) 05/04/18 Breast Left   05/04/18    1043     512   Incision (Closed) 05/04/18 Breast Left   05/04/18    1139     512          Intake/Output Last 24 hours  Intake/Output Summary (Last 24 hours) at 09/28/2019 2332 Last data filed at 09/28/2019 2114 Gross per 24 hour  Intake 1000 ml  Output --  Net 1000 ml    Labs/Imaging Results for orders placed or performed during the hospital encounter of 09/28/19 (from the past 48 hour(s))  Lipase, blood     Status: None   Collection Time: 09/28/19  1:51 PM  Result Value Ref Range   Lipase 21 11 - 51 U/L    Comment: Performed at [facility], [place] [address], [place], [place] [address]  Comprehensive metabolic panel     Status: Abnormal   Collection Time: 09/28/19  1:51 PM  Result Value Ref Range   Sodium 136 135 - 145 mmol/L   Potassium 3.7 3.5 - 5.1 mmol/L   Chloride 101 98 - 111 mmol/L   CO2 25 22 - 32 mmol/L   Glucose, Bld 122 (H) 70 - 99 mg/dL    Comment: Glucose reference range applies only to samples taken after fasting for at least 8 hours.   BUN 10 8 - 23 mg/dL   Creatinine, Ser 0.84 0.44 - 1.00 mg/dL   Calcium 10.0 8.9 - 10.3 mg/dL   Total Protein 6.8 6.5 - 8.1 g/dL   Albumin 3.9 3.5 - 5.0 g/dL   AST 19 15 - 41 U/L   ALT 14 0 - 44 U/L   Alkaline Phosphatase 94 38 - 126 U/L   Total Bilirubin 0.5 0.3 - 1.2 mg/dL   GFR calc non Af Amer >60 >60 mL/min   GFR calc Af Amer >60 >60 mL/min   Anion gap 10 5 - 15    Comment: Performed at [facility], [place] [address], [place], [place] [address]  CBC     Status: None   Collection Time: 09/28/19  1:51 PM  Result Value Ref Range   WBC 7.7 4.0 - 10.5 K/uL   RBC 4.08 3.87 - 5.11 MIL/uL   Hemoglobin 13.2 12.0 - 15.0 g/dL   HCT 39.2  36.0 - 46.0 %   MCV 96.1 80.0 - 100.0 fL   MCH 32.4 26.0 - 34.0 pg   MCHC 33.7 30.0 - 36.0 g/dL   RDW 14.1 11.5 - 15.5 %   Platelets 240 150 - 400 K/uL   nRBC  0.0 0.0 - 0.2 %    Comment: Performed at [facility], [place] [address], [place], [place] [address]  Urinalysis, Routine w reflex microscopic     Status: None   Collection Time: 09/28/19  7:40 PM  Result Value Ref Range   Color, Urine YELLOW YELLOW   APPearance CLEAR CLEAR   Specific Gravity, Urine 1.014 1.005 - 1.030   pH 6.0 5.0 - 8.0   Glucose, UA NEGATIVE NEGATIVE mg/dL   Hgb urine dipstick NEGATIVE NEGATIVE   Bilirubin Urine NEGATIVE NEGATIVE   Ketones, ur NEGATIVE NEGATIVE mg/dL   Protein, ur NEGATIVE NEGATIVE mg/dL   Nitrite NEGATIVE NEGATIVE   Leukocytes,Ua NEGATIVE NEGATIVE    Comment: Performed at [place] [address], [place], [place] [address]  Respiratory Panel by RT PCR (Flu A&B, Covid) - Nasopharyngeal Swab     Status: None   Collection Time: 09/28/19  8:55 PM   Specimen: Nasopharyngeal Swab  Result Value Ref Range   SARS Coronavirus 2 by RT PCR NEGATIVE NEGATIVE    Comment: (NOTE) SARS-CoV-2 target nucleic acids are NOT DETECTED. The SARS-CoV-2 RNA is generally detectable in upper respiratoy specimens during the acute phase of infection. The lowest concentration of SARS-CoV-2 viral copies this assay can detect is 131 copies/mL. A negative result does not preclude SARS-Cov-2 infection and should not be used as the sole basis for treatment or other patient management decisions. A negative result may occur with  improper specimen collection/handling, submission of specimen other than nasopharyngeal swab, presence of viral mutation(s) within the areas targeted by this assay, and inadequate number of viral copies (<131 copies/mL). A negative result must be combined with clinical observations, patient history, and epidemiological information. The expected result is  Negative. Fact Sheet for Patients:  PinkCheek.be Fact Sheet for Healthcare Providers:  GravelBags.it This test is not yet ap proved or cleared by the [place] FDA and  has been authorized for detection and/or diagnosis of SARS-CoV-2 by FDA under an Emergency Use Authorization (EUA). This EUA will remain  in effect (meaning this test can be used) for the duration of the COVID-19 declaration under Section 564(b)(1) of the Act, 21 U.S.C. section 360bbb-3(b)(1), unless the authorization is terminated or revoked sooner.    Influenza A by PCR NEGATIVE NEGATIVE   Influenza B by PCR NEGATIVE NEGATIVE    Comment: (NOTE) The Xpert Xpress SARS-CoV-2/FLU/RSV assay is intended as an aid in  the diagnosis of influenza from Nasopharyngeal swab specimens and  should not be used as a sole basis for treatment. Nasal washings and  aspirates are unacceptable for Xpert Xpress SARS-CoV-2/FLU/RSV  testing. Fact Sheet for Patients: PinkCheek.be Fact Sheet for Healthcare Providers: GravelBags.it This test is not yet approved or cleared by the [place] FDA and  has been authorized for detection and/or diagnosis of SARS-CoV-2 by  FDA under an Emergency Use Authorization (EUA). This EUA will remain  in effect (meaning this test can be used) for the duration of the  Covid-19 declaration under Section 564(b)(1) of the Act, 21  U.S.C. section 360bbb-3(b)(1), unless the authorization is  terminated or revoked. Performed at [facility], [place] [address] [place], [place] [address]    CT ABDOMEN PELVIS W CONTRAST  Result Date: 09/28/2019 CLINICAL DATA:  Nonlocalized upper and mid abdominal pain for the past 2 months. Intermittent nausea, vomiting and diarrhea for the past 3 months. Bright red blood in the stool since last night. EXAM: CT ABDOMEN AND  PELVIS WITH CONTRAST  TECHNIQUE: Multidetector CT imaging of the abdomen and pelvis was performed using the standard protocol following bolus administration of intravenous contrast. CONTRAST:  150mL OMNIPAQUE IOHEXOL 300 MG/ML  SOLN COMPARISON:  06/28/2013. FINDINGS: Lower chest: Minimal linear scarring/atelectasis at both lung bases. Normal sized heart. Hepatobiliary: Minimal diffuse low density of the liver relative to the spleen. Normal appearing gallbladder. Pancreas: Unremarkable. No pancreatic ductal dilatation or surrounding inflammatory changes. Spleen: Normal in size without focal abnormality. Adrenals/Urinary Tract: Stable 2.6 cm fat density left adrenal mass. Normal appearing right adrenal gland. Mild bowel rotation of the right kidney. Normal appearing left kidney, ureters and urinary bladder. Stomach/Bowel: Mild diffuse low density wall thickening involving the descending and sigmoid colon. Minimal associated pericolonic soft tissue stranding. Unremarkable stomach, small bowel and appendix. Vascular/Lymphatic: Atheromatous arterial calcifications without aneurysm. No enlarged lymph nodes. Reproductive: Uterus and bilateral adnexa are unremarkable. Other: Small umbilical hernia containing fat. Musculoskeletal: Screw and rod fixation from the lower thoracic spine to the upper sacrum. Old, healed burst fracture of the T10 vertebral body with approximately 40% compression. IMPRESSION: 1. Mild changes of colitis involving the descending and sigmoid colon. This is most likely infectious or inflammatory in nature. There are no CT findings to indicate and ischemic cause. 2. Minimal diffuse hepatic steatosis. 3. Stable left adrenal myelolipoma. Electronically Signed   By: Claudie Revering M.D.   On: 09/28/2019 18:51    Pending Labs Unresulted Labs (From admission, onward)    Start     Ordered   09/29/19 0500  CBC  Tomorrow morning,   R     09/28/19 2319   09/28/19 2320  C Difficile Quick Screen w PCR reflex  (C Difficile quick  screen w PCR reflex panel)  Once, for 24 hours,   STAT     09/28/19 2319   09/28/19 2320  Gastrointestinal Panel by PCR , Stool  (Gastrointestinal Panel by PCR, Stool                                                                                                                                                     *Does Not include CLOSTRIDIUM DIFFICILE testing.**If CDIFF testing is needed, select the C Difficile Quick Screen w PCR reflex order below)  Once,   STAT     09/28/19 2319   09/28/19 2318  Lactic acid, plasma  Once,   STAT     09/28/19 2319   09/28/19 2317  HIV Antibody (routine testing w rflx)  (HIV Antibody (Routine testing w reflex) panel)  Once,   STAT     09/28/19 2319   09/28/19 2010  Occult blood card to lab, stool  Once,   STAT     09/28/19 2010          Vitals/Pain Today's Vitals   09/28/19 2000 09/28/19  2045 09/28/19 2135 09/28/19 2315  BP: (!) 150/80 (!) 153/85  (!) 145/75  Pulse: 86 84  88  Resp: 18 (!) 23  18  Temp:      SpO2: 98% 100%  98%  Weight:      Height:      PainSc: 0-No pain  0-No pain     Isolation Precautions Enteric precautions (UV disinfection)  Medications Medications  polyethylene glycol-electrolytes (NuLYTELY) solution 500 mL (has no administration in time range)  ondansetron (ZOFRAN) injection 4 mg (has no administration in time range)  ezetimibe (ZETIA) tablet 10 mg (has no administration in time range)  buPROPion (WELLBUTRIN XL) 24 hr tablet 300 mg (has no administration in time range)  fluvoxaMINE (LUVOX) tablet 100 mg (has no administration in time range)  pantoprazole (PROTONIX) EC tablet 40 mg (has no administration in time range)  clonazePAM (KLONOPIN) tablet 1 mg (has no administration in time range)  fluticasone (FLONASE) 50 MCG/ACT nasal spray 2 spray (has no administration in time range)  acetaminophen (TYLENOL) tablet 650 mg (has no administration in time range)    Or  acetaminophen (TYLENOL) suppository 650 mg (has no  administration in time range)  morphine 2 MG/ML injection 1 mg (has no administration in time range)  0.9 %  sodium chloride infusion (has no administration in time range)  cefTRIAXone (ROCEPHIN) 2 g in sodium chloride 0.9 % 100 mL IVPB (has no administration in time range)  metroNIDAZOLE (FLAGYL) IVPB 500 mg (has no administration in time range)  ondansetron (ZOFRAN) injection 4 mg (4 mg Intravenous Given 09/28/19 1725)  lactated ringers bolus 1,000 mL (0 mLs Intravenous Stopped 09/28/19 2114)  sodium chloride (PF) 0.9 % injection (  Given by Other 09/28/19 1917)  iohexol (OMNIPAQUE) 300 MG/ML solution 100 mL (100 mLs Intravenous Contrast Given 09/28/19 1831)  fentaNYL (SUBLIMAZE) injection 50 mcg (50 mcg Intravenous Given 09/28/19 2114)  ondansetron (ZOFRAN) injection 4 mg (4 mg Intravenous Given 09/28/19 2114)    Mobility walks Low fall risk   Focused Assessments    R Recommendations: See Admitting Provider Note  Report given to:   Additional Notes:  NPO

## 2019-09-28 NOTE — ED Notes (Signed)
Provider collected occult stool sample and labeled, placed in mini lab without requisition.   Per day shift charge we should send all POC to main lab since no mini lab personal available.   Lab called to notify that provider had incorrectly labeled sample and will need to be recollected.    MD made aware.

## 2019-09-28 NOTE — ED Notes (Signed)
Pt ambulatory to [facility] 

## 2019-09-28 NOTE — ED Notes (Signed)
Messaged pharmacy to send dose of medication due at 2115

## 2019-09-28 NOTE — ED Provider Notes (Signed)
[place] DEPT Provider Note   CSN: [id number] Arrival date & time: 09/28/19  1322     History Chief Complaint  Patient presents with  . Abdominal Pain  . Rectal Bleeding    Euline Mccory is a 69 y.o. female.  Patient is a 69 year old female with a history of hypertension, hyperlipidemia, rheumatoid arthritis, psoriatic arthritis and GERD who is presenting today with 2 months of worsening abdominal pain and dark rectal bleeding over the last 24 hours.  Patient reports over the last 2 months the pain is in the epigastrium and moves into the periumbilical area.  She initially thought that maybe eating food helped the symptoms but reported that within an hour the nausea and discomfort were back.  She also will intermittently have vomiting and diarrhea.  She did have diarrhea last night and started to notice dark blood like the color of menses several times last night and she also had two episodes this morning which she said was medium in amount.  Patient denies any chest pain, cough but reports exertional dyspnea for quite some time more than 8 months not associated with these new symptoms.  She denies any lower extremity edema.  She does report she has been eating less but has only lost 2 pounds in the last 2 months.  No fever, recent surgeries.  Patient has increased her Prilosec to two a day in the last 2 to 3 weeks.  She has had an upper GI and gallbladder ultrasound both which were unrevealing except for hiatal hernia.  She does have an appointment to see her GI doctor on May 25 but with the rectal bleeding today she was concerned and decided to come to the emergency room.  She denies NSAID use, alcohol use no prior history of stomach ulcers.  The history is provided by the patient.  Abdominal Pain Pain location:  Epigastric and periumbilical Associated symptoms: hematochezia   Rectal Bleeding Associated symptoms: abdominal pain        Past Medical  History:  Diagnosis Date  . Ankylosing spondylitis ([place])   . Anxiety and depression   . Depression   . Fibromyalgia   . GERD (gastroesophageal reflux disease)   . History of basal cell carcinoma excision    FACE, [place]  . History of hiatal hernia   . History of thrush   . Hyperlipidemia   . Hypertension   . Insomnia   . Left breast mass   . OCD (obsessive compulsive disorder)   . OSA (obstructive sleep apnea)    MILD AND NO CPAP SINCE SURGERY IN 2007  . Osteopenia   . PONV (postoperative nausea and vomiting)   . Psoriatic arthritis ([place])   . PVC (premature ventricular contraction)   . Rheumatoid arthritis [facility])     Patient Active Problem List   Diagnosis Date Noted  . DDD (degenerative disc disease), cervical s/p fusion 11/12/2016  . H/O total knee replacement, left 05/06/2016  . DDD lumbar spine status post fusion 05/06/2016  . Psoriasis 05/05/2016  . High risk medication use 05/05/2016  . Cervical post-laminectomy syndrome 12/09/2015  . Thoracic postlaminectomy syndrome 12/09/2015  . Psoriatic arthritis ([place]) 08/23/2012  . Osteopenia 08/23/2012  . HLD (hyperlipidemia) 08/23/2012  . RLS (restless legs syndrome) 08/23/2012  . Insomnia 08/23/2012  . Fibromyalgia syndrome 08/12/2012  . Low back pain 08/12/2012  . Syncope   . PVC (premature ventricular contraction)   . OCD (obsessive compulsive disorder)   .  GERD (gastroesophageal reflux disease)   . Hypertension     Past Surgical History:  Procedure Laterality Date  . BREAST LUMPECTOMY WITH RADIOACTIVE SEED LOCALIZATION Left 05/04/2018   Procedure: LEFT BREAST LUMPECTOMY WITH RADIOACTIVE SEED LOCALIZATION AND LEFT BREAST NIPPLE BIOPSY;  Surgeon: Jovita Kussmaul, MD;  Location: [place];  Service: General;  Laterality: Left;  . BREAST SURGERY  1975   lumpectomy-- benign  . BUNIONECTOMY  1991  . CATARACT EXTRACTION W/ INTRAOCULAR LENS  IMPLANT, BILATERAL  2000  . CERVICAL FUSION  March 2013     C5 -- C7  . CESAREAN SECTION  1985  . DISTAL INTERPHALANGEAL JOINT FUSION Right 03/14/2015   Procedure: RIGHT LONG FINGER DISTAL INTERPHALANGEAL JOINT ARTHRODESIS;  Surgeon: Iran Planas, MD;  Location: [place];  Service: Orthopedics;  Laterality: Right;  . EYE SURGERY  1995   rk (laser surgery), semi cornea transplant, detacted retina,  fluid removal  . KNEE ARTHROSCOPY Left 03-03-2004  . POSTERIOR VITRECTOMY RIGHT EYE AND LASER   10-27-1999  . SHOULDER SURGERY Right 1997  . SPINAL FIXATION SURGERY W/ IMPLANT  2013 rod #1//  2014  rod 2   S1 -- T10  (rod #1)//   S1 -- T4 (rod #2)  . THORACIC FUSION  03-13-2013   REMOVAL HARDWARE/  BONE GRAFT FUSION T10//  REVISION OF RODS  . TOTAL KNEE ARTHROPLASTY Left 12-14-2005  . UVULOPALATOPHARYNGOPLASTY  04-26-2006   w/  TONSILLECTOMY/  TURBINATE REDUCTIONS/  BILATERAL ANTERIOR ETHMOIDECTOMY     OB History   No obstetric history on file.     Family History  Problem Relation Age of Onset  . Diabetes Mother   . Heart disease Mother   . Diabetes Father   . Anuerysm Father   . Diabetes Brother   . Heart disease Brother   . Diabetes Brother   . Heart disease Brother     Social History   Tobacco Use  . Smoking status: Former Smoker    Packs/day: 0.50    Years: 10.00    Pack years: 5.00    Types: Cigarettes    Quit date: 03/07/1995    Years since quitting: 24.5  . Smokeless tobacco: Never Used  Substance Use Topics  . Alcohol use: Yes    Comment: rare  . Drug use: Never    Home Medications Prior to Admission medications   Medication Sig Start Date End Date Taking? Authorizing Provider  amLODipine (NORVASC) 5 MG tablet Take 1 tablet (5 mg total) by mouth daily. 09/29/18   Susy Frizzle, MD  buPROPion (WELLBUTRIN XL) 300 MG 24 hr tablet Take 300 mg by mouth every morning. 04/07/17   [provider]  clonazePAM (KLONOPIN) 1 MG tablet TAKE 1 TABLET BY MOUTH EVERY DAY AS NEEDED FOR ANXIETY 07/07/19    Susy Frizzle, MD  ezetimibe (ZETIA) 10 MG tablet TAKE 1 TABLET BY MOUTH EVERY DAY 07/17/19   Susy Frizzle, MD  fluticasone (FLONASE) 50 MCG/ACT nasal spray Place 2 sprays into both nostrils as needed.     [provider]  fluvoxaMINE (LUVOX) 100 MG tablet Take 100 mg by mouth 2 (two) times daily. 07/19/19   [provider]  Golimumab ([place] ARIA IV) Inject into the vein every 8 (eight) weeks.    [provider]  metoprolol succinate (TOPROL-XL) 50 MG 24 hr tablet TAKE 1 TABLET BY MOUTH ONCE DAILY FOLLOWING A MEAL 11/30/18   Susy Frizzle, MD  omeprazole (PRILOSEC) 40 MG capsule Take 1 capsule (40 mg total) by mouth in the morning and at bedtime. 09/12/19   Susy Frizzle, MD  oxyCODONE-acetaminophen (PERCOCET) 5-325 MG tablet Take 1 tablet by mouth every 4 (four) hours as needed. 09/12/19   Susy Frizzle, MD  traZODone (DESYREL) 100 MG tablet Take 100 mg by mouth at bedtime as needed.     [provider]  valsartan (DIOVAN) 320 MG tablet TAKE 1 TABLET BY MOUTH EVERY DAY 05/15/19   Susy Frizzle, MD    Allergies    Latex, Amlodipine besy-benazepril hcl, Augmentin [amoxicillin-pot clavulanate], Bextra [valdecoxib], Chlorhexidine, Lipitor [atorvastatin calcium], Sulfa antibiotics, and Zocor [simvastatin]  Review of Systems   Review of Systems  Gastrointestinal: Positive for abdominal pain and hematochezia.  All other systems reviewed and are negative.   Physical Exam Updated Vital Signs BP 127/89   Pulse 84   Temp 98.3 F (36.8 C)   Resp 13   SpO2 96%   Physical Exam Vitals and nursing note reviewed.  Constitutional:      General: She is in acute distress.     Appearance: She is well-developed.  HENT:     Head: Normocephalic and atraumatic.  Eyes:     Pupils: Pupils are equal, round, and reactive to light.  Cardiovascular:     Rate and Rhythm: Normal rate and regular rhythm.     Heart sounds: Normal heart sounds. No  murmur. No friction rub.  Pulmonary:     Effort: Pulmonary effort is normal.     Breath sounds: Normal breath sounds. No wheezing or rales.  Abdominal:     General: Bowel sounds are normal. There is no distension.     Palpations: Abdomen is soft.     Tenderness: There is abdominal tenderness in the epigastric area, periumbilical area and suprapubic area. There is no right CVA tenderness, left CVA tenderness, guarding or rebound.  Genitourinary:    Rectum: No mass or tenderness.     Comments: No significant stool in the rectal vault.  Faintly pink fluid present but no frank bleeding.  No hemorrhoids are noted. Musculoskeletal:        General: No tenderness. Normal range of motion.     Comments: No edema  Skin:    General: Skin is warm and dry.     Capillary Refill: Capillary refill takes less than 2 seconds.     Findings: No rash.  Neurological:     General: No focal deficit present.     Mental Status: She is alert and oriented to person, place, and time.     Cranial Nerves: No cranial nerve deficit.  Psychiatric:        Mood and Affect: Mood normal.        Behavior: Behavior normal.     ED Results / Procedures / Treatments   Labs (all labs ordered are listed, but only abnormal results are displayed) Labs Reviewed  COMPREHENSIVE METABOLIC PANEL - Abnormal; Notable for the following components:      Result Value   Glucose, Bld 122 (*)    All other components within normal limits  LIPASE, BLOOD  CBC  URINALYSIS, ROUTINE W REFLEX MICROSCOPIC  OCCULT BLOOD X 1 CARD TO LAB, STOOL    EKG EKG Interpretation  Date/Time:  Thursday Sep 28 2019 17:20:14 EDT Ventricular Rate:  81 PR Interval:    QRS Duration: 107 QT Interval:  379 QTC Calculation: 440 R Axis:   44 Text  Interpretation: Sinus rhythm Abnrm T, consider ischemia, anterolateral lds No significant change since last tracing Confirmed by Blanchie Dessert [phone]) on 09/28/2019 5:45:22 PM   Radiology CT ABDOMEN PELVIS  W CONTRAST  Result Date: 09/28/2019 CLINICAL DATA:  Nonlocalized upper and mid abdominal pain for the past 2 months. Intermittent nausea, vomiting and diarrhea for the past 3 months. Bright red blood in the stool since last night. EXAM: CT ABDOMEN AND PELVIS WITH CONTRAST TECHNIQUE: Multidetector CT imaging of the abdomen and pelvis was performed using the standard protocol following bolus administration of intravenous contrast. CONTRAST:  193mL OMNIPAQUE IOHEXOL 300 MG/ML  SOLN COMPARISON:  06/28/2013. FINDINGS: Lower chest: Minimal linear scarring/atelectasis at both lung bases. Normal sized heart. Hepatobiliary: Minimal diffuse low density of the liver relative to the spleen. Normal appearing gallbladder. Pancreas: Unremarkable. No pancreatic ductal dilatation or surrounding inflammatory changes. Spleen: Normal in size without focal abnormality. Adrenals/Urinary Tract: Stable 2.6 cm fat density left adrenal mass. Normal appearing right adrenal gland. Mild bowel rotation of the right kidney. Normal appearing left kidney, ureters and urinary bladder. Stomach/Bowel: Mild diffuse low density wall thickening involving the descending and sigmoid colon. Minimal associated pericolonic soft tissue stranding. Unremarkable stomach, small bowel and appendix. Vascular/Lymphatic: Atheromatous arterial calcifications without aneurysm. No enlarged lymph nodes. Reproductive: Uterus and bilateral adnexa are unremarkable. Other: Small umbilical hernia containing fat. Musculoskeletal: Screw and rod fixation from the lower thoracic spine to the upper sacrum. Old, healed burst fracture of the T10 vertebral body with approximately 40% compression. IMPRESSION: 1. Mild changes of colitis involving the descending and sigmoid colon. This is most likely infectious or inflammatory in nature. There are no CT findings to indicate and ischemic cause. 2. Minimal diffuse hepatic steatosis. 3. Stable left adrenal myelolipoma. Electronically  Signed   By: Claudie Revering M.D.   On: 09/28/2019 18:51    Procedures Procedures (including critical care time)  Medications Ordered in ED Medications - No data to display  ED Course  I have reviewed the triage vital signs and the nursing notes.  Pertinent labs & imaging results that were available during my care of the patient were reviewed by me and considered in my medical decision making (see chart for details).    MDM Rules/Calculators/A&P                      Elderly female presenting today with 2 months of worsening abdominal pain.  Currently is under the care of her family doctor and is awaiting to see Dr. Penelope Coop her GI specialist.  She has had an upper GI and right upper quadrant ultrasound which were both noncontributory.  Symptoms seem to be constant all the time initially she thought slightly better with eating but now she is not sure.  She has intermittent diarrhea and vomiting.  The symptoms do not seem to be made worse with food.  She has lost 2 pounds in the last 2 months but no other weight loss.  Patient symptoms seem to be less likely from mesenteric ischemia.  We will do an EKG to ensure there is no EKG changes concerning for cardiac etiology.  She currently is also complaining of rectal bleeding.  There is no frank bleeding or hemorrhoids noted on my exam.  She does not take anticoagulation.  She does not use NSAIDs or alcohol and has no prior history of stomach ulcers.  She has increased her PPI to two times a day for the last 2 to 3 weeks.  Her abdomen is tender in epigastric periumbilical and suprapubic region but no right upper quadrant tenderness.  Low suspicion that this is gallbladder in origin.  Lipase and CMP are both unrevealing without suspicion for pancreatitis at this time.  Concern for possible mass versus partial obstruction.  Also possible ulcer disease.  Lower suspicion for lung pathology as the cause at this time.  Also it sounds like patient's rectal bleeding is  a bit darker could be from internal hemorrhoids versus a lower GI source.  She does get colonoscopies every 5 years due to recurrent polyps.  Last colonoscopy was 3 years ago where there were polyps removed.  Patient given IV fluids and nausea medication.  EKG and CT pending  8:40 PM EKG does have chronic changes but no acute findings.  CT shows mild changes of colitis involving the descending and sigmoid colon.  This is most likely infectious or inflammatory in nature but no CT findings to indicate an ischemic cause.  Spoke with Dr. Cristina Gong with GI and he was concerned that patient may be having colonic ischemia as the cause of her symptoms.  He is not sure that this is related to her abdominal pain she has been having for the last 2 months.  He did recommend admission for colonoscopy tomorrow.  Patient needs to be n.p.o. after midnight but he did request 4 to 6 glasses of GoLYTELY for a mild prep.  They will plan on seeing her in the morning.  MDM Number of Diagnoses or Management Options   Amount and/or Complexity of Data Reviewed Clinical lab tests: ordered and reviewed Tests in the radiology section of CPT: ordered and reviewed Tests in the medicine section of CPT: ordered and reviewed Decide to obtain previous medical records or to obtain history from someone other than the patient: yes Obtain history from someone other than the patient: yes Review and summarize past medical records: yes Discuss the patient with other providers: yes Independent visualization of images, tracings, or specimens: yes  Risk of Complications, Morbidity, and/or Mortality Presenting problems: moderate Diagnostic procedures: low Management options: low  Patient Progress Patient progress: stable   Final Clinical Impression(s) / ED Diagnoses Final diagnoses:  Colitis  Lower GI bleed    Rx / DC Orders ED Discharge Orders    None       Blanchie Dessert, MD 09/28/19 2042

## 2019-09-29 ENCOUNTER — Inpatient Hospital Stay (HOSPITAL_COMMUNITY): Payer: Medicare Other | Admitting: Certified Registered"

## 2019-09-29 ENCOUNTER — Encounter (HOSPITAL_COMMUNITY)
Admission: EM | Disposition: A | Discharge status: Home / Self Care | Payer: Self-pay | Source: Home / Self Care | Attending: Internal Medicine

## 2019-09-29 ENCOUNTER — Encounter (HOSPITAL_COMMUNITY): Payer: Self-pay | Admitting: Internal Medicine

## 2019-09-29 DIAGNOSIS — K219 Gastro-esophageal reflux disease without esophagitis: Secondary | ICD-10-CM

## 2019-09-29 DIAGNOSIS — F419 Anxiety disorder, unspecified: Secondary | ICD-10-CM

## 2019-09-29 DIAGNOSIS — L4050 Arthropathic psoriasis, unspecified: Secondary | ICD-10-CM

## 2019-09-29 DIAGNOSIS — F329 Major depressive disorder, single episode, unspecified: Secondary | ICD-10-CM

## 2019-09-29 DIAGNOSIS — E7800 Pure hypercholesterolemia, unspecified: Secondary | ICD-10-CM

## 2019-09-29 DIAGNOSIS — A0472 Enterocolitis due to Clostridium difficile, not specified as recurrent: Principal | ICD-10-CM

## 2019-09-29 HISTORY — PX: COLONOSCOPY WITH PROPOFOL: SHX5780

## 2019-09-29 HISTORY — PX: ESOPHAGOGASTRODUODENOSCOPY (EGD) WITH PROPOFOL: SHX5813

## 2019-09-29 HISTORY — PX: POLYPECTOMY: SHX5525

## 2019-09-29 HISTORY — PX: BIOPSY: SHX5522

## 2019-09-29 LAB — CBC
HCT: 34.9 % — ABNORMAL LOW (ref 36.0–46.0)
Hemoglobin: 11.7 g/dL — ABNORMAL LOW (ref 12.0–15.0)
MCH: 31.9 pg (ref 26.0–34.0)
MCHC: 33.5 g/dL (ref 30.0–36.0)
MCV: 95.1 fL (ref 80.0–100.0)
Platelets: 204 10*3/uL (ref 150–400)
RBC: 3.67 MIL/uL — ABNORMAL LOW (ref 3.87–5.11)
RDW: 14.1 % (ref 11.5–15.5)
WBC: 9 10*3/uL (ref 4.0–10.5)
nRBC: 0 % (ref 0.0–0.2)

## 2019-09-29 LAB — CLOSTRIDIUM DIFFICILE BY PCR, REFLEXED: Toxigenic C. Difficile by PCR: POSITIVE — AB

## 2019-09-29 LAB — C DIFFICILE QUICK SCREEN W PCR REFLEX
C Diff antigen: POSITIVE — AB
C Diff toxin: NEGATIVE

## 2019-09-29 LAB — SURGICAL PCR SCREEN
MRSA, PCR: NEGATIVE
Staphylococcus aureus: NEGATIVE

## 2019-09-29 LAB — LACTIC ACID, PLASMA: Lactic Acid, Venous: 1.3 mmol/L (ref 0.5–1.9)

## 2019-09-29 LAB — OCCULT BLOOD X 1 CARD TO LAB, STOOL: Fecal Occult Bld: POSITIVE — AB

## 2019-09-29 LAB — HIV ANTIBODY (ROUTINE TESTING W REFLEX): HIV Screen 4th Generation wRfx: NONREACTIVE

## 2019-09-29 SURGERY — ESOPHAGOGASTRODUODENOSCOPY (EGD) WITH PROPOFOL
Anesthesia: Monitor Anesthesia Care

## 2019-09-29 MED ORDER — PROPOFOL 10 MG/ML IV BOLUS
INTRAVENOUS | Status: DC | PRN
  Administered 2019-09-29: 30 mg via INTRAVENOUS
  Administered 2019-09-29 (×2): 20 mg via INTRAVENOUS

## 2019-09-29 MED ORDER — FENTANYL CITRATE (PF) 100 MCG/2ML IJ SOLN
INTRAMUSCULAR | Status: AC
  Filled 2019-09-29: qty 2

## 2019-09-29 MED ORDER — FENTANYL CITRATE (PF) 100 MCG/2ML IJ SOLN
INTRAMUSCULAR | Status: DC | PRN
  Administered 2019-09-29: 25 ug via INTRAVENOUS
  Administered 2019-09-29: 50 ug via INTRAVENOUS

## 2019-09-29 MED ORDER — SODIUM CHLORIDE 0.9 % IV SOLN: INTRAVENOUS | Status: DC

## 2019-09-29 MED ORDER — NAPROXEN SODIUM 220 MG PO TABS: 440.0000 mg | ORAL_TABLET | Freq: Two times a day (BID) | ORAL | Status: DC | PRN

## 2019-09-29 MED ORDER — VANCOMYCIN 50 MG/ML ORAL SOLUTION
125.0000 mg | Freq: Four times a day (QID) | ORAL | Status: DC
  Administered 2019-09-29 – 2019-10-01 (×9): 125 mg via ORAL
  Filled 2019-09-29 (×11): qty 2.5

## 2019-09-29 MED ORDER — SACCHAROMYCES BOULARDII 250 MG PO CAPS
500.0000 mg | ORAL_CAPSULE | Freq: Two times a day (BID) | ORAL | Status: DC
  Administered 2019-09-29 – 2019-10-01 (×5): 500 mg via ORAL
  Filled 2019-09-29 (×5): qty 2

## 2019-09-29 MED ORDER — FAMOTIDINE 20 MG PO TABS
20.0000 mg | ORAL_TABLET | Freq: Every day | ORAL | Status: DC
  Administered 2019-09-30 – 2019-10-01 (×2): 20 mg via ORAL
  Filled 2019-09-29 (×2): qty 1

## 2019-09-29 MED ORDER — RISAQUAD PO CAPS
ORAL_CAPSULE | Freq: Every day | ORAL | Status: DC
  Administered 2019-09-30 – 2019-10-01 (×2): 1 via ORAL
  Filled 2019-09-29 (×2): qty 1

## 2019-09-29 MED ORDER — LACTATED RINGERS IV SOLN
INTRAVENOUS | Status: DC
  Administered 2019-09-29: 1000 mL via INTRAVENOUS

## 2019-09-29 MED ORDER — NAPROXEN 250 MG PO TABS
500.0000 mg | ORAL_TABLET | Freq: Two times a day (BID) | ORAL | Status: DC | PRN
  Administered 2019-09-29: 500 mg via ORAL
  Filled 2019-09-29 (×3): qty 2

## 2019-09-29 MED ORDER — ONDANSETRON HCL 4 MG/2ML IJ SOLN
INTRAMUSCULAR | Status: DC | PRN
  Administered 2019-09-29: 4 mg via INTRAVENOUS

## 2019-09-29 MED ORDER — PROPOFOL 500 MG/50ML IV EMUL
INTRAVENOUS | Status: DC | PRN
  Administered 2019-09-29: 125 ug/kg/min via INTRAVENOUS

## 2019-09-29 MED ORDER — SUMATRIPTAN SUCCINATE 50 MG PO TABS
50.0000 mg | ORAL_TABLET | ORAL | Status: DC | PRN
  Administered 2019-09-29: 50 mg via ORAL
  Filled 2019-09-29 (×3): qty 1

## 2019-09-29 MED ORDER — DEXTROSE IN LACTATED RINGERS 5 % IV SOLN: INTRAVENOUS | Status: DC

## 2019-09-29 MED ORDER — IRBESARTAN 150 MG PO TABS
300.0000 mg | ORAL_TABLET | Freq: Every day | ORAL | Status: DC
  Administered 2019-09-30 – 2019-10-01 (×2): 300 mg via ORAL
  Filled 2019-09-29 (×2): qty 2

## 2019-09-29 SURGICAL SUPPLY — 25 items

## 2019-09-29 NOTE — Plan of Care (Signed)

## 2019-09-29 NOTE — Transfer of Care (Signed)
Immediate Anesthesia Transfer of Care Note  Patient: Ross Hefferan  Procedure(s) Performed: ESOPHAGOGASTRODUODENOSCOPY (EGD) WITH PROPOFOL (N/A ) COLONOSCOPY WITH PROPOFOL (N/A ) BIOPSY POLYPECTOMY  Patient Location: PACU  Anesthesia Type:MAC  Level of Consciousness: drowsy and patient cooperative  Airway & Oxygen Therapy: Patient Spontanous Breathing and Patient connected to nasal cannula oxygen  Post-op Assessment: Report given to RN and Post -op Vital signs reviewed and stable  Post vital signs: Reviewed and stable  Last Vitals:  Vitals Value Taken Time  BP    Temp    Pulse    Resp 18 09/29/19 1204  SpO2    Vitals shown include unvalidated device data.  Last Pain:  Vitals:   09/29/19 1028  TempSrc: Oral  PainSc: 0-No pain      Patients Stated Pain Goal: 2 (19/37/90 2409)  Complications: No apparent anesthesia complications

## 2019-09-29 NOTE — Consult Note (Signed)
Referring Provider:  Dr. Lurline Del Primary Care Physician:  Susy Frizzle, MD Primary Gastroenterologist:  Dr. Penelope Coop  Reason for Consultation: Nausea vomiting, abdominal pain, diarrhea, small-volume hematochezia.  HPI: Janet Peterson is a 69 y.o. female admitted through the emergency room last night because of the above symptoms.  She has a 46-month history of postprandial nausea, occasional vomiting, and abdominal discomfort.    This prompted evaluation several weeks ago by her primary gastroenterologist, Dr. Acquanetta Sit, who obtained an upper GI series which showed a 6 cm hiatal hernia, and an abdominal ultrasound which showed a normal gallbladder but some hepatic steatosis.  The patient has been symptomatic most days out of the past 2 months.  She admits to moderate usage of nonsteroidal anti-inflammatory drugs such as ibuprofen, probably about 20 tablets over the past month.  There has not been any anorexia or weight loss despite the above symptoms.  The patient developed diarrhea in recent days.  2 nights ago, she had severe abdominal pain and yesterday, she had small amounts of blood in her diarrhea, which was confirmed by digital exam of the ER examiner.  Since admission, she has tested positive for C. difficile but negative for the C. difficile toxin.  She denies recent antibiotic exposure.  The patient has been on omeprazole once daily for years, but at her recent office visit with Dr. Penelope Coop, it was increased to twice a day.  The patient does have a prior history of colon polyps.  On her most recent colonoscopy by Dr. Penelope Coop in 2018, 3 small adenomas were removed and a 5-year follow-up was planned.     Past Medical History:  Diagnosis Date  . Ankylosing spondylitis ([place])   . Anxiety and depression   . Depression   . Fibromyalgia   . GERD (gastroesophageal reflux disease)   . History of basal cell carcinoma excision    FACE, [place]  . History of hiatal hernia   .  History of thrush   . Hyperlipidemia   . Hypertension   . Insomnia   . Left breast mass   . OCD (obsessive compulsive disorder)   . OSA (obstructive sleep apnea)    MILD AND NO CPAP SINCE SURGERY IN 2007  . Osteopenia   . PONV (postoperative nausea and vomiting)   . Psoriatic arthritis ([place])   . PVC (premature ventricular contraction)   . Rheumatoid arthritis [facility])     Past Surgical History:  Procedure Laterality Date  . BREAST LUMPECTOMY WITH RADIOACTIVE SEED LOCALIZATION Left 05/04/2018   Procedure: LEFT BREAST LUMPECTOMY WITH RADIOACTIVE SEED LOCALIZATION AND LEFT BREAST NIPPLE BIOPSY;  Surgeon: Jovita Kussmaul, MD;  Location: [place];  Service: General;  Laterality: Left;  . BREAST SURGERY  1975   lumpectomy-- benign  . BUNIONECTOMY  1991  . CATARACT EXTRACTION W/ INTRAOCULAR LENS  IMPLANT, BILATERAL  2000  . CERVICAL FUSION  March 2013   C5 -- C7  . CESAREAN SECTION  1985  . DISTAL INTERPHALANGEAL JOINT FUSION Right 03/14/2015   Procedure: RIGHT LONG FINGER DISTAL INTERPHALANGEAL JOINT ARTHRODESIS;  Surgeon: Iran Planas, MD;  Location: [place];  Service: Orthopedics;  Laterality: Right;  . EYE SURGERY  1995   rk (laser surgery), semi cornea transplant, detacted retina,  fluid removal  . KNEE ARTHROSCOPY Left 03-03-2004  . POSTERIOR VITRECTOMY RIGHT EYE AND LASER   10-27-1999  . SHOULDER SURGERY Right 1997  . SPINAL FIXATION SURGERY W/  IMPLANT  2013 rod #1//  2014  rod 2   S1 -- T10  (rod #1)//   S1 -- T4 (rod #2)  . THORACIC FUSION  03-13-2013   REMOVAL HARDWARE/  BONE GRAFT FUSION T10//  REVISION OF RODS  . TOTAL KNEE ARTHROPLASTY Left 12-14-2005  . UVULOPALATOPHARYNGOPLASTY  04-26-2006   w/  TONSILLECTOMY/  TURBINATE REDUCTIONS/  BILATERAL ANTERIOR ETHMOIDECTOMY    Prior to Admission medications   Medication Sig Start Date End Date Taking? Authorizing Provider  buPROPion (WELLBUTRIN XL) 300 MG 24 hr tablet Take 300 mg by mouth  every morning. 04/07/17  Yes [provider]  clonazePAM (KLONOPIN) 1 MG tablet TAKE 1 TABLET BY MOUTH EVERY DAY AS NEEDED FOR ANXIETY Patient taking differently: Take 1 mg by mouth daily as needed for anxiety.  07/07/19  Yes Susy Frizzle, MD  ezetimibe (ZETIA) 10 MG tablet TAKE 1 TABLET BY MOUTH EVERY DAY Patient taking differently: Take 10 mg by mouth daily after breakfast.  07/17/19  Yes Susy Frizzle, MD  fluticasone (FLONASE) 50 MCG/ACT nasal spray Place 2 sprays into both nostrils daily as needed for allergies.    Yes [provider]  fluvoxaMINE (LUVOX) 100 MG tablet Take 100 mg by mouth 2 (two) times daily. 07/19/19  Yes [provider]  Golimumab ([place] ARIA IV) Inject into the vein every 8 (eight) weeks.   Yes [provider]  metoprolol succinate (TOPROL-XL) 50 MG 24 hr tablet TAKE 1 TABLET BY MOUTH ONCE DAILY FOLLOWING A MEAL Patient taking differently: Take 50 mg by mouth daily after breakfast.  11/30/18  Yes Susy Frizzle, MD  naproxen sodium (ALEVE) 220 MG tablet Take 440 mg by mouth 2 (two) times daily as needed (pain).   Yes [provider]  omeprazole (PRILOSEC) 40 MG capsule Take 1 capsule (40 mg total) by mouth in the morning and at bedtime. 09/12/19  Yes Susy Frizzle, MD  oxyCODONE-acetaminophen (PERCOCET) 5-325 MG tablet Take 1 tablet by mouth every 4 (four) hours as needed. Patient taking differently: Take 1 tablet by mouth at bedtime.  09/12/19  Yes Susy Frizzle, MD  Probiotic Product (PROBIOTIC PO) Take 1 tablet by mouth daily after breakfast.   Yes [provider]  valsartan (DIOVAN) 320 MG tablet TAKE 1 TABLET BY MOUTH EVERY DAY Patient taking differently: Take 320 mg by mouth daily after breakfast.  05/15/19  Yes Susy Frizzle, MD  amLODipine (NORVASC) 5 MG tablet Take 1 tablet (5 mg total) by mouth daily. Patient not taking: Reported on 09/28/2019 09/29/18   Susy Frizzle, MD    Current  Facility-Administered Medications  Medication Dose Route Frequency Provider Last Rate Last Admin  . 0.9 %  sodium chloride infusion   Intravenous Continuous Shela Leff, MD 125 mL/hr at 09/29/19 0109 New Bag at 09/29/19 0109  . 0.9 %  sodium chloride infusion   Intravenous Continuous Rika Daughdrill, MD      . acetaminophen (TYLENOL) tablet 650 mg  650 mg Oral Q6H PRN Shela Leff, MD       Or  . acetaminophen (TYLENOL) suppository 650 mg  650 mg Rectal Q6H PRN Shela Leff, MD      . buPROPion (WELLBUTRIN XL) 24 hr tablet 300 mg  300 mg Oral q morning - 10a Shela Leff, MD      . cefTRIAXone (ROCEPHIN) 2 g in sodium chloride 0.9 % 100 mL IVPB  2 g Intravenous QHS Shela Leff, MD 200  mL/hr at 09/29/19 0256 2 g at 09/29/19 0256  . clonazePAM (KLONOPIN) tablet 1 mg  1 mg Oral Daily PRN Shela Leff, MD      . ezetimibe (ZETIA) tablet 10 mg  10 mg Oral QPC breakfast Shela Leff, MD      . fluticasone (FLONASE) 50 MCG/ACT nasal spray 2 spray  2 spray Each Nare Daily PRN Shela Leff, MD      . fluvoxaMINE (LUVOX) tablet 100 mg  100 mg Oral BID Shela Leff, MD      . metroNIDAZOLE (FLAGYL) IVPB 500 mg  500 mg Intravenous Quay Burow, MD 100 mL/hr at 09/29/19 0527 500 mg at 09/29/19 0527  . morphine 2 MG/ML injection 1 mg  1 mg Intravenous Q3H PRN Shela Leff, MD   1 mg at 09/29/19 0526  . ondansetron (ZOFRAN) injection 4 mg  4 mg Intravenous Q6H PRN Shela Leff, MD   4 mg at 09/29/19 0334  . pantoprazole (PROTONIX) EC tablet 40 mg  40 mg Oral BID AC Shela Leff, MD      . vancomycin (VANCOCIN) 50 mg/mL oral solution 125 mg  125 mg Oral QID Ronald Lobo, MD        Allergies as of 09/28/2019 - Review Complete 09/28/2019  Allergen Reaction Noted  . Latex Rash 03/03/2013  . Amlodipine besy-benazepril hcl Other (See Comments) 12/23/2010  . Augmentin [amoxicillin-pot clavulanate] Diarrhea and Nausea And  Vomiting 12/23/2010  . Bextra [valdecoxib] Diarrhea and Nausea And Vomiting 12/23/2010  . Chlorhexidine  07/26/2018  . Lipitor [atorvastatin calcium] Other (See Comments) 12/23/2010  . Sulfa antibiotics Nausea And Vomiting 12/23/2010  . Zocor [simvastatin] Other (See Comments) 03/07/2015    Family History  Problem Relation Age of Onset  . Diabetes Mother   . Heart disease Mother   . Diabetes Father   . Anuerysm Father   . Diabetes Brother   . Heart disease Brother   . Diabetes Brother   . Heart disease Brother     Social History   Socioeconomic History  . Marital status: Married    Spouse name: Not on file  . Number of children: Not on file  . Years of education: Not on file  . Highest education level: Not on file  Occupational History  . Not on file  Tobacco Use  . Smoking status: Former Smoker    Packs/day: 0.50    Years: 10.00    Pack years: 5.00    Types: Cigarettes    Quit date: 03/07/1995    Years since quitting: 24.5  . Smokeless tobacco: Never Used  Substance and Sexual Activity  . Alcohol use: Yes    Comment: rare  . Drug use: Never  . Sexual activity: Not on file  Other Topics Concern  . Not on file  Social History Narrative  . Not on file   Social Determinants of Health   Financial Resource Strain:   . Difficulty of Paying Living Expenses:   Food Insecurity:   . Worried About Charity fundraiser in the Last Year:   . Arboriculturist in the Last Year:   Transportation Needs:   . Film/video editor (Medical):   Marland Kitchen Lack of Transportation (Non-Medical):   Physical Activity:   . Days of Exercise per Week:   . Minutes of Exercise per Session:   Stress:   . Feeling of Stress :   Social Connections:   . Frequency of Communication with Friends and Family:   .  Frequency of Social Gatherings with Friends and Family:   . Attends Religious Services:   . Active Member of Clubs or Organizations:   . Attends Archivist Meetings:   Marland Kitchen  Marital Status:   Intimate Partner Violence:   . Fear of Current or Ex-Partner:   . Emotionally Abused:   Marland Kitchen Physically Abused:   . Sexually Abused:     Review of Systems: No chest pain, dyspnea, urinary symptoms.  No anorexia or weight loss.  Physical Exam: Vital signs in last 24 hours: Temp:  [98 F (36.7 C)-98.5 F (36.9 C)] 98.5 F (36.9 C) (05/07 [id number]) Pulse Rate:  [79-91] 89 (05/07 0633) Resp:  [13-23] 18 (05/07 [id number]) BP: (119-157)/(66-91) 126/66 (05/07 [id number]) SpO2:  [93 %-100 %] 93 % (05/07 [id number]) Weight:  [90.7 kg] 90.7 kg (05/06 1716) Last BM Date: 09/28/19  This is a very pleasant but severely overweight Caucasian female, lying in bed in absolutely no distress.  She is anicteric and without pallor.  Radial pulse is full.  Chest clear.  Heart without murmur or arrhythmia.  Abdomen has bowel sounds, moderate adiposity but no obvious distention.  No guarding or significant tenderness.  Certainly no peritoneal findings.  No peripheral edema.  No obvious skin rashes or joint effusions or deformities.  Mood is normal, without obvious anxiety or depression.  No evident focal neurologic deficits or impairment of cognition.  Intake/Output from previous day: 05/06 0701 - 05/07 0700 In: 1000 [IV Piggyback:1000] Out: 400 [Urine:400] Intake/Output this shift: No intake/output data recorded.  Lab Results: Recent Labs    09/28/19 1351 09/29/19 0046  WBC 7.7 9.0  HGB 13.2 11.7*  HCT 39.2 34.9*  PLT 240 204   BMET Recent Labs    09/28/19 1351  NA 136  K 3.7  CL 101  CO2 25  GLUCOSE 122*  BUN 10  CREATININE 0.84  CALCIUM 10.0   LFT Recent Labs    09/28/19 1351  PROT 6.8  ALBUMIN 3.9  AST 19  ALT 14  ALKPHOS 94  BILITOT 0.5   PT/INR No results for input(s): LABPROT, INR in the last 72 hours.  Studies/Results: CT ABDOMEN PELVIS W CONTRAST  Result Date: 09/28/2019 CLINICAL DATA:  Nonlocalized upper and mid abdominal pain for the past 2 months. Intermittent  nausea, vomiting and diarrhea for the past 3 months. Bright red blood in the stool since last night. EXAM: CT ABDOMEN AND PELVIS WITH CONTRAST TECHNIQUE: Multidetector CT imaging of the abdomen and pelvis was performed using the standard protocol following bolus administration of intravenous contrast. CONTRAST:  175mL OMNIPAQUE IOHEXOL 300 MG/ML  SOLN COMPARISON:  06/28/2013. FINDINGS: Lower chest: Minimal linear scarring/atelectasis at both lung bases. Normal sized heart. Hepatobiliary: Minimal diffuse low density of the liver relative to the spleen. Normal appearing gallbladder. Pancreas: Unremarkable. No pancreatic ductal dilatation or surrounding inflammatory changes. Spleen: Normal in size without focal abnormality. Adrenals/Urinary Tract: Stable 2.6 cm fat density left adrenal mass. Normal appearing right adrenal gland. Mild bowel rotation of the right kidney. Normal appearing left kidney, ureters and urinary bladder. Stomach/Bowel: Mild diffuse low density wall thickening involving the descending and sigmoid colon. Minimal associated pericolonic soft tissue stranding. Unremarkable stomach, small bowel and appendix. Vascular/Lymphatic: Atheromatous arterial calcifications without aneurysm. No enlarged lymph nodes. Reproductive: Uterus and bilateral adnexa are unremarkable. Other: Small umbilical hernia containing fat. Musculoskeletal: Screw and rod fixation from the lower thoracic spine to the upper sacrum. Old, healed burst fracture of the T10 vertebral body  with approximately 40% compression. IMPRESSION: 1. Mild changes of colitis involving the descending and sigmoid colon. This is most likely infectious or inflammatory in nature. There are no CT findings to indicate and ischemic cause. 2. Minimal diffuse hepatic steatosis. 3. Stable left adrenal myelolipoma. Electronically Signed   By: Claudie Revering M.D.   On: 09/28/2019 18:51    Impression: 1.  Small-volume hematochezia, resolved, in association with  abdominal pain and recent diarrhea 2.  Segmental distal colitis (with rectal sparing) on admission CT 3.  Positive C. difficile antigen, negative toxin without recent antibiotic exposure 4.  Prodrome of postprandial nausea and nonspecific abdominal discomfort with negative recent GI series and ultrasound 5.  Significant NSAID exposure, but on high-dose PPI therapy 6.  Hepatic steatosis by ultrasound, but normal liver chemistries 7.  History of small colonic adenomas as of 3 years ago  Discussion: I think there may be 2 separate processes going on, namely, (1) the patient's postprandial nausea and abdominal discomfort of 2 months duration, and (2) recent diarrhea with small-volume hematochezia.  I am unclear as to the cause of the first problem, but the second problem sounds like ischemic colitis to me.  I tend to think that the positive C. difficile antigen is a red herring.  Plan: 1.  Proceed to upper endoscopy (in view of upper tract symptoms including nausea and postprandial abdominal discomfort and history of recent NSAID usage) 2.  Proceed to colonoscopy/sigmoidoscopy, concurrently with the endoscopy, in view of apparent colitis and recent rectal bleeding.  Hopefully this can differentiate between ischemic colitis and C. difficile colitis.  The patient had only 2 cups of prep last night, but has been having diarrhea and indicates that her stool is liquid and brown.  Hopefully she will be sufficiently cleansed that, at a minimum, a gross inspection of the distal colon will be possible. 3.  I have stopped the patient's Rocephin since she has no fever, leukocytosis, or focus of infection other than possible C. difficile. 4.  For now, I have initiated vancomycin and Florastor probiotic as treatment for possible C. Difficile 5.  Risks of upper endoscopy and colonoscopy reviewed, patient agreeable.  She is aware that Dr. Therisa Doyne will be doing the procedure.   LOS: 1 day   [place]  09/29/2019,  8:58 AM   Pager [phone] If no answer or after 5 PM call [phone]

## 2019-09-29 NOTE — Anesthesia Preprocedure Evaluation (Signed)
Anesthesia Evaluation  Patient identified by MRN, date of birth, ID band Patient awake    Reviewed: Allergy & Precautions, NPO status , Patient's Chart, lab work & pertinent test results, reviewed documented beta blocker date and time   History of Anesthesia Complications (+) PONV and history of anesthetic complications  Airway Mallampati: IV   Neck ROM: Limited  Mouth opening: Limited Mouth Opening  Dental  (+) Edentulous Lower, Edentulous Upper   Pulmonary sleep apnea , former smoker,    breath sounds clear to auscultation       Cardiovascular hypertension, Pt. on medications and Pt. on home beta blockers  Rhythm:Regular Rate:Normal     Neuro/Psych PSYCHIATRIC DISORDERS Anxiety Depression  Neuromuscular disease    GI/Hepatic Neg liver ROS, hiatal hernia, GERD  Medicated,  Endo/Other  negative endocrine ROS  Renal/GU negative Renal ROS     Musculoskeletal  (+) Arthritis , Rheumatoid disorders,  Fibromyalgia -  Abdominal   Peds  Hematology negative hematology ROS (+)   Anesthesia Other Findings Left complex sclerosing lesion Ankylosing spondylitis  Reproductive/Obstetrics                             Anesthesia Physical  Anesthesia Plan  ASA: III  Anesthesia Plan: MAC   Post-op Pain Management:    Induction: Intravenous  PONV Risk Score and Plan: 3 and Propofol infusion, TIVA and Treatment may vary due to age or medical condition  Airway Management Planned: Natural Airway  Additional Equipment:   Intra-op Plan:   Post-operative Plan:   Informed Consent: I have reviewed the patients History and Physical, chart, labs and discussed the procedure including the risks, benefits and alternatives for the proposed anesthesia with the patient or authorized representative who has indicated his/her understanding and acceptance.     Dental advisory given  Plan Discussed with:  CRNA  Anesthesia Plan Comments:         Anesthesia Quick Evaluation

## 2019-09-29 NOTE — Progress Notes (Signed)
PROGRESS NOTE    Janet Peterson  [id number] DOB: 1950-09-18 DOA: 09/28/2019 PCP: Susy Frizzle, MD    Brief Narrative:  Patient was admitted to the hospital with the working diagnosis of colitis.  69 year old female who presented with abdominal pain and rectal bleeding.  She does have the significant past medical history for psoriatic arthritis, osteoarthrosis, fibromyalgia, degenerative disc disease, GERD, anxiety, depression and obstructive sleep apnea.  Patient complained bloody diarrhea x2 over the last 24 hours prior to hospitalization.  She has been experiencing dyspepsia and her antiacid therapy was increased to twice daily Prilosec by her gastroenterologist within the last month.  On her initial physical examination, blood pressure 150/80, heart rate 84, respiratory rate 13, oxygen saturation 95%.  Her lungs are clear to auscultation bilaterally, heart S1-S2 present and rhythmic, her abdomen had generalized tenderness, positive guarding, no lower extremity edema. Sodium 136, potassium 3.7, chloride 101, bicarb 25, glucose 122, BUN 10, creatinine 0.84, white count 7.7, hemoglobin 13.2, hematocrit 39.2, platelets 240.  Abdomen/pelvis CT scan with changes consistent with colitis involving the descending and sigmoid colon. SARS COVID 19 negative.   Patient was placed on IV antibiotic therapy, IV fluids, as needed antiemetics and analgesics. C diff antigen positive, C diff toxin negative, PCR positive.   Patient underwent colonoscopy finding congested, erythematous, friable, inflamed, ulcerated and thickened folds of the mucosa in the sigmoid and descending colon. Upper endoscopy with erythematous mucosa in the antrum.   Assessment & Plan:   Principal Problem:   C. difficile colitis Active Problems:   GERD (gastroesophageal reflux disease)   Psoriatic arthritis ([facility])   HLD (hyperlipidemia)   Colitis   Anxiety and depression   1. C diff colitis. Patient tested positive for C  Diff PCR, colonoscopy with positive colitis at the sigmoid and descending colon. Wbc is 9,0.   Continue antibiotic therapy with oral vancomycin, continue supportive medical care with IV fluids, and as needed antiemetics and analgesics. Continue with full liquid diet.   2. Acute migraine headache. Positive headache on the right side of the head. Patient had migraine in the past, which responded to triptans.  Will add sumatriptan and follow response.   3. GERD. Continue antiacid therapy with famotidine, will avoid proton pump inhibitor in the setting of C diff.   4. Dyslipidemia. Continue ezetimbide.   5. Psoriatic arthritis. NO signs of acute flare, continue outpatient infusion of Simponi Aria.   6. Depression. Continue with bupropion and clonazepam. Continue with fluvoxamine.   7. HTN, Continue blood pressure control with irbesartan.    Status is: Inpatient  Remains inpatient appropriate because:IV treatments appropriate due to intensity of illness or inability to take PO   Dispo: The patient is from: Home              Anticipated d/c is to: Home              Anticipated d/c date is: 2 days              Patient currently is not medically stable to d/c.        DVT prophylaxis: scd  Code Status:   full  Family Communication:  No family at the bedside      Consultants:   GI   Procedures:   Colonoscopy   Upper endoscopy   Antimicrobials:   Oral vancomycin     Subjective: Patient is feeling better but not yet back to baseline, continue to have abdominal discomfort, but no  nausea or vomiting. Positive headache at the right side, moderate in intensity.   Objective: Vitals:   09/29/19 1210 09/29/19 1215 09/29/19 1217 09/29/19 1250  BP: (!) 143/66  (!) 150/70 134/75  Pulse: 99 97 96 93  Resp: (!) 21 18 17 16   Temp:    98 F (36.7 C)  TempSrc:    Oral  SpO2: 95% 95% 97% 95%  Weight:      Height:        Intake/Output Summary (Last 24 hours) at 09/29/2019  1303 Last data filed at 09/29/2019 1204 Gross per 24 hour  Intake 1800 ml  Output 400 ml  Net 1400 ml   Filed Weights   09/28/19 1716 09/29/19 1028  Weight: 90.7 kg 90.7 kg    Examination:   General: Not in pain or dyspnea, deconditioned and ill looking appearing  Neurology: Awake and alert, non focal  E ENT: positive pallor, no icterus, oral mucosa moist Cardiovascular: No JVD. S1-S2 present, rhythmic, no gallops, rubs, or murmurs. No lower extremity edema. Pulmonary: positive breath sounds bilaterally, adequate air movement, no wheezing, rhonchi or rales. Gastrointestinal. Abdomen mild distention with no organomegaly, non tender, no rebound or guarding Skin. No rashes Musculoskeletal: no joint deformities     Data Reviewed: I have personally reviewed following labs and imaging studies  CBC: Recent Labs  Lab 09/28/19 1351 09/29/19 0046  WBC 7.7 9.0  HGB 13.2 11.7*  HCT 39.2 34.9*  MCV 96.1 95.1  PLT 240 0000000   Basic Metabolic Panel: Recent Labs  Lab 09/28/19 1351  NA 136  K 3.7  CL 101  CO2 25  GLUCOSE 122*  BUN 10  CREATININE 0.84  CALCIUM 10.0   GFR: Estimated Creatinine Clearance: 69 mL/min (by C-G formula based on SCr of 0.84 mg/dL). Liver Function Tests: Recent Labs  Lab 09/28/19 1351  AST 19  ALT 14  ALKPHOS 94  BILITOT 0.5  PROT 6.8  ALBUMIN 3.9   Recent Labs  Lab 09/28/19 1351  LIPASE 21   No results for input(s): AMMONIA in the last 168 hours. Coagulation Profile: No results for input(s): INR, PROTIME in the last 168 hours. Cardiac Enzymes: No results for input(s): CKTOTAL, CKMB, CKMBINDEX, TROPONINI in the last 168 hours. BNP (last 3 results) No results for input(s): PROBNP in the last 8760 hours. HbA1C: No results for input(s): HGBA1C in the last 72 hours. CBG: No results for input(s): GLUCAP in the last 168 hours. Lipid Profile: No results for input(s): CHOL, HDL, LDLCALC, TRIG, CHOLHDL, LDLDIRECT in the last 72  hours. Thyroid Function Tests: No results for input(s): TSH, T4TOTAL, FREET4, T3FREE, THYROIDAB in the last 72 hours. Anemia Panel: No results for input(s): VITAMINB12, FOLATE, FERRITIN, TIBC, IRON, RETICCTPCT in the last 72 hours.    Radiology Studies: I have reviewed all of the imaging during this hospital visit personally     Scheduled Meds: . [START ON 09/30/2019] acidophilus   Oral QPC breakfast  . buPROPion  300 mg Oral q morning - 10a  . ezetimibe  10 mg Oral QPC breakfast  . fluvoxaMINE  100 mg Oral BID  . irbesartan  300 mg Oral Daily  . pantoprazole  40 mg Oral BID AC  . saccharomyces boulardii  500 mg Oral BID  . vancomycin  125 mg Oral QID   Continuous Infusions: . lactated ringers 1,000 mL (09/29/19 1039)  . metronidazole 500 mg (09/29/19 0527)     LOS: 1 day  Devani Odonnel Gerome Apley, MD

## 2019-09-29 NOTE — H&P (View-Only) (Signed)
Referring Provider:  Dr. Lurline Del Primary Care Physician:  Susy Frizzle, MD Primary Gastroenterologist:  Dr. Penelope Coop  Reason for Consultation: Nausea vomiting, abdominal pain, diarrhea, small-volume hematochezia.  HPI: Janet Peterson is a 69 y.o. female admitted through the emergency room last night because of the above symptoms.  She has a 49-month history of postprandial nausea, occasional vomiting, and abdominal discomfort.    This prompted evaluation several weeks ago by her primary gastroenterologist, Dr. Acquanetta Sit, who obtained an upper GI series which showed a 6 cm hiatal hernia, and an abdominal ultrasound which showed a normal gallbladder but some hepatic steatosis.  The patient has been symptomatic most days out of the past 2 months.  She admits to moderate usage of nonsteroidal anti-inflammatory drugs such as ibuprofen, probably about 20 tablets over the past month.  There has not been any anorexia or weight loss despite the above symptoms.  The patient developed diarrhea in recent days.  2 nights ago, she had severe abdominal pain and yesterday, she had small amounts of blood in her diarrhea, which was confirmed by digital exam of the ER examiner.  Since admission, she has tested positive for C. difficile but negative for the C. difficile toxin.  She denies recent antibiotic exposure.  The patient has been on omeprazole once daily for years, but at her recent office visit with Dr. Penelope Coop, it was increased to twice a day.  The patient does have a prior history of colon polyps.  On her most recent colonoscopy by Dr. Penelope Coop in 2018, 3 small adenomas were removed and a 5-year follow-up was planned.     Past Medical History:  Diagnosis Date  . Ankylosing spondylitis ([place])   . Anxiety and depression   . Depression   . Fibromyalgia   . GERD (gastroesophageal reflux disease)   . History of basal cell carcinoma excision    FACE, [place]  . History of hiatal hernia   .  History of thrush   . Hyperlipidemia   . Hypertension   . Insomnia   . Left breast mass   . OCD (obsessive compulsive disorder)   . OSA (obstructive sleep apnea)    MILD AND NO CPAP SINCE SURGERY IN 2007  . Osteopenia   . PONV (postoperative nausea and vomiting)   . Psoriatic arthritis ([place])   . PVC (premature ventricular contraction)   . Rheumatoid arthritis [facility])     Past Surgical History:  Procedure Laterality Date  . BREAST LUMPECTOMY WITH RADIOACTIVE SEED LOCALIZATION Left 05/04/2018   Procedure: LEFT BREAST LUMPECTOMY WITH RADIOACTIVE SEED LOCALIZATION AND LEFT BREAST NIPPLE BIOPSY;  Surgeon: Jovita Kussmaul, MD;  Location: [place];  Service: General;  Laterality: Left;  . BREAST SURGERY  1975   lumpectomy-- benign  . BUNIONECTOMY  1991  . CATARACT EXTRACTION W/ INTRAOCULAR LENS  IMPLANT, BILATERAL  2000  . CERVICAL FUSION  March 2013   C5 -- C7  . CESAREAN SECTION  1985  . DISTAL INTERPHALANGEAL JOINT FUSION Right 03/14/2015   Procedure: RIGHT LONG FINGER DISTAL INTERPHALANGEAL JOINT ARTHRODESIS;  Surgeon: Iran Planas, MD;  Location: [place];  Service: Orthopedics;  Laterality: Right;  . EYE SURGERY  1995   rk (laser surgery), semi cornea transplant, detacted retina,  fluid removal  . KNEE ARTHROSCOPY Left 03-03-2004  . POSTERIOR VITRECTOMY RIGHT EYE AND LASER   10-27-1999  . SHOULDER SURGERY Right 1997  . SPINAL FIXATION SURGERY W/  IMPLANT  2013 rod #1//  2014  rod 2   S1 -- T10  (rod #1)//   S1 -- T4 (rod #2)  . THORACIC FUSION  03-13-2013   REMOVAL HARDWARE/  BONE GRAFT FUSION T10//  REVISION OF RODS  . TOTAL KNEE ARTHROPLASTY Left 12-14-2005  . UVULOPALATOPHARYNGOPLASTY  04-26-2006   w/  TONSILLECTOMY/  TURBINATE REDUCTIONS/  BILATERAL ANTERIOR ETHMOIDECTOMY    Prior to Admission medications   Medication Sig Start Date End Date Taking? Authorizing Provider  buPROPion (WELLBUTRIN XL) 300 MG 24 hr tablet Take 300 mg by mouth  every morning. 04/07/17  Yes [provider]  clonazePAM (KLONOPIN) 1 MG tablet TAKE 1 TABLET BY MOUTH EVERY DAY AS NEEDED FOR ANXIETY Patient taking differently: Take 1 mg by mouth daily as needed for anxiety.  07/07/19  Yes Susy Frizzle, MD  ezetimibe (ZETIA) 10 MG tablet TAKE 1 TABLET BY MOUTH EVERY DAY Patient taking differently: Take 10 mg by mouth daily after breakfast.  07/17/19  Yes Susy Frizzle, MD  fluticasone (FLONASE) 50 MCG/ACT nasal spray Place 2 sprays into both nostrils daily as needed for allergies.    Yes [provider]  fluvoxaMINE (LUVOX) 100 MG tablet Take 100 mg by mouth 2 (two) times daily. 07/19/19  Yes [provider]  Golimumab ([place] ARIA IV) Inject into the vein every 8 (eight) weeks.   Yes [provider]  metoprolol succinate (TOPROL-XL) 50 MG 24 hr tablet TAKE 1 TABLET BY MOUTH ONCE DAILY FOLLOWING A MEAL Patient taking differently: Take 50 mg by mouth daily after breakfast.  11/30/18  Yes Susy Frizzle, MD  naproxen sodium (ALEVE) 220 MG tablet Take 440 mg by mouth 2 (two) times daily as needed (pain).   Yes [provider]  omeprazole (PRILOSEC) 40 MG capsule Take 1 capsule (40 mg total) by mouth in the morning and at bedtime. 09/12/19  Yes Susy Frizzle, MD  oxyCODONE-acetaminophen (PERCOCET) 5-325 MG tablet Take 1 tablet by mouth every 4 (four) hours as needed. Patient taking differently: Take 1 tablet by mouth at bedtime.  09/12/19  Yes Susy Frizzle, MD  Probiotic Product (PROBIOTIC PO) Take 1 tablet by mouth daily after breakfast.   Yes [provider]  valsartan (DIOVAN) 320 MG tablet TAKE 1 TABLET BY MOUTH EVERY DAY Patient taking differently: Take 320 mg by mouth daily after breakfast.  05/15/19  Yes Susy Frizzle, MD  amLODipine (NORVASC) 5 MG tablet Take 1 tablet (5 mg total) by mouth daily. Patient not taking: Reported on 09/28/2019 09/29/18   Susy Frizzle, MD    Current  Facility-Administered Medications  Medication Dose Route Frequency Provider Last Rate Last Admin  . 0.9 %  sodium chloride infusion   Intravenous Continuous Shela Leff, MD 125 mL/hr at 09/29/19 0109 New Bag at 09/29/19 0109  . 0.9 %  sodium chloride infusion   Intravenous Continuous Melika Reder, MD      . acetaminophen (TYLENOL) tablet 650 mg  650 mg Oral Q6H PRN Shela Leff, MD       Or  . acetaminophen (TYLENOL) suppository 650 mg  650 mg Rectal Q6H PRN Shela Leff, MD      . buPROPion (WELLBUTRIN XL) 24 hr tablet 300 mg  300 mg Oral q morning - 10a Shela Leff, MD      . cefTRIAXone (ROCEPHIN) 2 g in sodium chloride 0.9 % 100 mL IVPB  2 g Intravenous QHS Shela Leff, MD 200  mL/hr at 09/29/19 0256 2 g at 09/29/19 0256  . clonazePAM (KLONOPIN) tablet 1 mg  1 mg Oral Daily PRN Shela Leff, MD      . ezetimibe (ZETIA) tablet 10 mg  10 mg Oral QPC breakfast Shela Leff, MD      . fluticasone (FLONASE) 50 MCG/ACT nasal spray 2 spray  2 spray Each Nare Daily PRN Shela Leff, MD      . fluvoxaMINE (LUVOX) tablet 100 mg  100 mg Oral BID Shela Leff, MD      . metroNIDAZOLE (FLAGYL) IVPB 500 mg  500 mg Intravenous Quay Burow, MD 100 mL/hr at 09/29/19 0527 500 mg at 09/29/19 0527  . morphine 2 MG/ML injection 1 mg  1 mg Intravenous Q3H PRN Shela Leff, MD   1 mg at 09/29/19 0526  . ondansetron (ZOFRAN) injection 4 mg  4 mg Intravenous Q6H PRN Shela Leff, MD   4 mg at 09/29/19 0334  . pantoprazole (PROTONIX) EC tablet 40 mg  40 mg Oral BID AC Shela Leff, MD      . vancomycin (VANCOCIN) 50 mg/mL oral solution 125 mg  125 mg Oral QID Ronald Lobo, MD        Allergies as of 09/28/2019 - Review Complete 09/28/2019  Allergen Reaction Noted  . Latex Rash 03/03/2013  . Amlodipine besy-benazepril hcl Other (See Comments) 12/23/2010  . Augmentin [amoxicillin-pot clavulanate] Diarrhea and Nausea And  Vomiting 12/23/2010  . Bextra [valdecoxib] Diarrhea and Nausea And Vomiting 12/23/2010  . Chlorhexidine  07/26/2018  . Lipitor [atorvastatin calcium] Other (See Comments) 12/23/2010  . Sulfa antibiotics Nausea And Vomiting 12/23/2010  . Zocor [simvastatin] Other (See Comments) 03/07/2015    Family History  Problem Relation Age of Onset  . Diabetes Mother   . Heart disease Mother   . Diabetes Father   . Anuerysm Father   . Diabetes Brother   . Heart disease Brother   . Diabetes Brother   . Heart disease Brother     Social History   Socioeconomic History  . Marital status: Married    Spouse name: Not on file  . Number of children: Not on file  . Years of education: Not on file  . Highest education level: Not on file  Occupational History  . Not on file  Tobacco Use  . Smoking status: Former Smoker    Packs/day: 0.50    Years: 10.00    Pack years: 5.00    Types: Cigarettes    Quit date: 03/07/1995    Years since quitting: 24.5  . Smokeless tobacco: Never Used  Substance and Sexual Activity  . Alcohol use: Yes    Comment: rare  . Drug use: Never  . Sexual activity: Not on file  Other Topics Concern  . Not on file  Social History Narrative  . Not on file   Social Determinants of Health   Financial Resource Strain:   . Difficulty of Paying Living Expenses:   Food Insecurity:   . Worried About Charity fundraiser in the Last Year:   . Arboriculturist in the Last Year:   Transportation Needs:   . Film/video editor (Medical):   Marland Kitchen Lack of Transportation (Non-Medical):   Physical Activity:   . Days of Exercise per Week:   . Minutes of Exercise per Session:   Stress:   . Feeling of Stress :   Social Connections:   . Frequency of Communication with Friends and Family:   .  Frequency of Social Gatherings with Friends and Family:   . Attends Religious Services:   . Active Member of Clubs or Organizations:   . Attends Archivist Meetings:   Marland Kitchen  Marital Status:   Intimate Partner Violence:   . Fear of Current or Ex-Partner:   . Emotionally Abused:   Marland Kitchen Physically Abused:   . Sexually Abused:     Review of Systems: No chest pain, dyspnea, urinary symptoms.  No anorexia or weight loss.  Physical Exam: Vital signs in last 24 hours: Temp:  [98 F (36.7 C)-98.5 F (36.9 C)] 98.5 F (36.9 C) (05/07 [id number]) Pulse Rate:  [79-91] 89 (05/07 0633) Resp:  [13-23] 18 (05/07 [id number]) BP: (119-157)/(66-91) 126/66 (05/07 [id number]) SpO2:  [93 %-100 %] 93 % (05/07 [id number]) Weight:  [90.7 kg] 90.7 kg (05/06 1716) Last BM Date: 09/28/19  This is a very pleasant but severely overweight Caucasian female, lying in bed in absolutely no distress.  She is anicteric and without pallor.  Radial pulse is full.  Chest clear.  Heart without murmur or arrhythmia.  Abdomen has bowel sounds, moderate adiposity but no obvious distention.  No guarding or significant tenderness.  Certainly no peritoneal findings.  No peripheral edema.  No obvious skin rashes or joint effusions or deformities.  Mood is normal, without obvious anxiety or depression.  No evident focal neurologic deficits or impairment of cognition.  Intake/Output from previous day: 05/06 0701 - 05/07 0700 In: 1000 [IV Piggyback:1000] Out: 400 [Urine:400] Intake/Output this shift: No intake/output data recorded.  Lab Results: Recent Labs    09/28/19 1351 09/29/19 0046  WBC 7.7 9.0  HGB 13.2 11.7*  HCT 39.2 34.9*  PLT 240 204   BMET Recent Labs    09/28/19 1351  NA 136  K 3.7  CL 101  CO2 25  GLUCOSE 122*  BUN 10  CREATININE 0.84  CALCIUM 10.0   LFT Recent Labs    09/28/19 1351  PROT 6.8  ALBUMIN 3.9  AST 19  ALT 14  ALKPHOS 94  BILITOT 0.5   PT/INR No results for input(s): LABPROT, INR in the last 72 hours.  Studies/Results: CT ABDOMEN PELVIS W CONTRAST  Result Date: 09/28/2019 CLINICAL DATA:  Nonlocalized upper and mid abdominal pain for the past 2 months. Intermittent  nausea, vomiting and diarrhea for the past 3 months. Bright red blood in the stool since last night. EXAM: CT ABDOMEN AND PELVIS WITH CONTRAST TECHNIQUE: Multidetector CT imaging of the abdomen and pelvis was performed using the standard protocol following bolus administration of intravenous contrast. CONTRAST:  132mL OMNIPAQUE IOHEXOL 300 MG/ML  SOLN COMPARISON:  06/28/2013. FINDINGS: Lower chest: Minimal linear scarring/atelectasis at both lung bases. Normal sized heart. Hepatobiliary: Minimal diffuse low density of the liver relative to the spleen. Normal appearing gallbladder. Pancreas: Unremarkable. No pancreatic ductal dilatation or surrounding inflammatory changes. Spleen: Normal in size without focal abnormality. Adrenals/Urinary Tract: Stable 2.6 cm fat density left adrenal mass. Normal appearing right adrenal gland. Mild bowel rotation of the right kidney. Normal appearing left kidney, ureters and urinary bladder. Stomach/Bowel: Mild diffuse low density wall thickening involving the descending and sigmoid colon. Minimal associated pericolonic soft tissue stranding. Unremarkable stomach, small bowel and appendix. Vascular/Lymphatic: Atheromatous arterial calcifications without aneurysm. No enlarged lymph nodes. Reproductive: Uterus and bilateral adnexa are unremarkable. Other: Small umbilical hernia containing fat. Musculoskeletal: Screw and rod fixation from the lower thoracic spine to the upper sacrum. Old, healed burst fracture of the T10 vertebral body  with approximately 40% compression. IMPRESSION: 1. Mild changes of colitis involving the descending and sigmoid colon. This is most likely infectious or inflammatory in nature. There are no CT findings to indicate and ischemic cause. 2. Minimal diffuse hepatic steatosis. 3. Stable left adrenal myelolipoma. Electronically Signed   By: Claudie Revering M.D.   On: 09/28/2019 18:51    Impression: 1.  Small-volume hematochezia, resolved, in association with  abdominal pain and recent diarrhea 2.  Segmental distal colitis (with rectal sparing) on admission CT 3.  Positive C. difficile antigen, negative toxin without recent antibiotic exposure 4.  Prodrome of postprandial nausea and nonspecific abdominal discomfort with negative recent GI series and ultrasound 5.  Significant NSAID exposure, but on high-dose PPI therapy 6.  Hepatic steatosis by ultrasound, but normal liver chemistries 7.  History of small colonic adenomas as of 3 years ago  Discussion: I think there may be 2 separate processes going on, namely, (1) the patient's postprandial nausea and abdominal discomfort of 2 months duration, and (2) recent diarrhea with small-volume hematochezia.  I am unclear as to the cause of the first problem, but the second problem sounds like ischemic colitis to me.  I tend to think that the positive C. difficile antigen is a red herring.  Plan: 1.  Proceed to upper endoscopy (in view of upper tract symptoms including nausea and postprandial abdominal discomfort and history of recent NSAID usage) 2.  Proceed to colonoscopy/sigmoidoscopy, concurrently with the endoscopy, in view of apparent colitis and recent rectal bleeding.  Hopefully this can differentiate between ischemic colitis and C. difficile colitis.  The patient had only 2 cups of prep last night, but has been having diarrhea and indicates that her stool is liquid and brown.  Hopefully she will be sufficiently cleansed that, at a minimum, a gross inspection of the distal colon will be possible. 3.  I have stopped the patient's Rocephin since she has no fever, leukocytosis, or focus of infection other than possible C. difficile. 4.  For now, I have initiated vancomycin and Florastor probiotic as treatment for possible C. Difficile 5.  Risks of upper endoscopy and colonoscopy reviewed, patient agreeable.  She is aware that Dr. Therisa Doyne will be doing the procedure.   LOS: 1 day   [place]  09/29/2019,  8:58 AM   Pager [phone] If no answer or after 5 PM call [phone]

## 2019-09-29 NOTE — Brief Op Note (Signed)
09/28/2019 - 09/29/2019  12:07 PM  PATIENT:  Janet Peterson  69 y.o. female  PRE-OPERATIVE DIAGNOSIS:  Nausea, abdominal pain, hematochezia  POST-OPERATIVE DIAGNOSIS:  EGD: gastric polyp, biopsied, gastric antrum biopsies r/o HP, duodenum biospies r/o celiac's, hiatal hernia,  Colon: descending and sigmoid colitis  PROCEDURE:  Procedure(s): ESOPHAGOGASTRODUODENOSCOPY (EGD) WITH PROPOFOL (N/A) COLONOSCOPY WITH PROPOFOL (N/A) BIOPSY POLYPECTOMY  SURGEON:  Surgeon(s) and Role:    Ronnette Juniper, MD - Primary  PHYSICIAN ASSISTANT:   ASSISTANTS: Emelia Loron, Claiborne Billings Moton,Tech  ANESTHESIA:   MAC  EBL:  Minimal  BLOOD ADMINISTERED:none  DRAINS: none   LOCAL MEDICATIONS USED:  NONE  SPECIMEN:  Biopsy / Limited Resection  DISPOSITION OF SPECIMEN:  PATHOLOGY  COUNTS:  YES  TOURNIQUET:  * No tourniquets in log *  DICTATION: .Dragon Dictation  PLAN OF CARE: Admit to inpatient   PATIENT DISPOSITION:  PACU - hemodynamically stable.   Delay start of Pharmacological VTE agent (>24hrs) due to surgical blood loss or risk of bleeding: not applicable

## 2019-09-29 NOTE — Op Note (Signed)
[facility] Patient Name: Janet Peterson Procedure Date: 09/29/2019 MRN: [id number] Attending MD: Ronnette Juniper , MD Date of Birth: [birth year] CSN: [id number] Age: 69 Admit Type: Inpatient Procedure:                Colonoscopy Indications:              Last colonoscopy: 2018, Diarrhea, Rectal bleeding,                            C diff antigen positive, r/o ischemic colitis Providers:                Ronnette Juniper, MD, Yehuda Mao,                            Technician, Maudry Diego, CRNA Referring MD:             [facility] Medicines:                Monitored Anesthesia Care Complications:            No immediate complications. Estimated blood loss:                            Minimal. Estimated Blood Loss:     Estimated blood loss was minimal. Procedure:                Pre-Anesthesia Assessment:                           - Prior to the procedure, a History and Physical                            was performed, and patient medications and                            allergies were reviewed. The patient's tolerance of                            previous anesthesia was also reviewed. The risks                            and benefits of the procedure and the sedation                            options and risks were discussed with the patient.                            All questions were answered, and informed consent                            was obtained. Prior Anticoagulants: The patient has                            taken no previous anticoagulant or antiplatelet  agents. ASA Grade Assessment: III - A patient with                            severe systemic disease. After reviewing the risks                            and benefits, the patient was deemed in                            satisfactory condition to undergo the procedure.                           - Prior to the procedure, a History and Physical         was performed, and patient medications and                            allergies were reviewed. The patient's tolerance of                            previous anesthesia was also reviewed. The risks                            and benefits of the procedure and the sedation                            options and risks were discussed with the patient.                            All questions were answered, and informed consent                            was obtained. Prior Anticoagulants: The patient has                            taken no previous anticoagulant or antiplatelet                            agents. ASA Grade Assessment: III - A patient with                            severe systemic disease. After reviewing the risks                            and benefits, the patient was deemed in                            satisfactory condition to undergo the procedure.                           After obtaining informed consent, the colonoscope                            was passed under direct vision. Throughout  the                            procedure, the patient's blood pressure, pulse, and                            oxygen saturations were monitored continuously. The                            [id number] Olympus pediatric colonscope                            was introduced through the anus and advanced to the                            the transverse colon. The colonoscopy was somewhat                            difficult due to a redundant colon, significant                            looping and a tortuous colon. The patient tolerated                            the procedure well. The quality of the bowel                            preparation was poor. Scope In: 11:41:59 AM Scope Out: 11:52:21 AM Total Procedure Duration: 0 hours 10 minutes 22 seconds  Findings:      The perianal and digital rectal examinations were normal.      A large amount of liquid semi-liquid  semi-solid stool was found in the       rectum, in the recto-sigmoid colon, in the sigmoid colon, in the       descending colon, at the splenic flexure and in the transverse colon,       making visualization difficult. Lavage of the area was performed,       resulting in incomplete clearance with fair visualization.      A diffuse area of moderately congested, erythematous, friable (with       contact bleeding), inflamed, ulcerated and thickened folds of the mucosa       was found in the sigmoid colon and in the descending colon. Biopsies       were taken with a cold forceps for histology.      Normal mucosa was found in the rectum and in the transverse colon. Impression:               - Preparation of the colon was poor.                           - Stool in the rectum, in the recto-sigmoid colon,                            in the sigmoid colon, in the descending colon, at  the splenic flexure and in the transverse colon.                           - Congested, erythematous, friable (with contact                            bleeding), inflamed, ulcerated and thickened folds                            of the mucosa in the sigmoid colon and in the                            descending colon. Biopsied.                           - Normal mucosa in the rectum and in the transverse                            colon. Moderate Sedation:      Patient did not receive moderate sedation for this procedure, but       instead received monitored anesthesia care. Recommendation:           - Full liquid diet.                           - Continue present medications.                           - Await pathology results. Procedure Code(s):        --- Professional ---                           [phone], 8, Colonoscopy, flexible; with biopsy,                            single or multiple Diagnosis Code(s):        --- Professional ---                           K92.2, Gastrointestinal  hemorrhage, unspecified                           K52.9, Noninfective gastroenteritis and colitis,                            unspecified                           K63.3, Ulcer of intestine                           K63.89, Other specified diseases of intestine                           R19.7, Diarrhea, unspecified  K62.5, Hemorrhage of anus and rectum CPT copyright 2019 [employer]. All rights reserved. The codes documented in this report are preliminary and upon coder review may  be revised to meet current compliance requirements. Ronnette Juniper, MD 09/29/2019 12:07:00 PM This report has been signed electronically. Number of Addenda: 0

## 2019-09-29 NOTE — Anesthesia Postprocedure Evaluation (Signed)
Anesthesia Post Note  Patient: Janet Peterson  Procedure(s) Performed: ESOPHAGOGASTRODUODENOSCOPY (EGD) WITH PROPOFOL (N/A ) COLONOSCOPY WITH PROPOFOL (N/A ) BIOPSY POLYPECTOMY     Patient location during evaluation: PACU Anesthesia Type: MAC Level of consciousness: awake and alert Pain management: pain level controlled Vital Signs Assessment: post-procedure vital signs reviewed and stable Respiratory status: spontaneous breathing Cardiovascular status: stable Anesthetic complications: no    Last Vitals:  Vitals:   09/29/19 1250 09/29/19 1346  BP: 134/75 136/84  Pulse: 93 93  Resp: 16   Temp: 36.7 C 36.6 C  SpO2: 95% 96%    Last Pain:  Vitals:   09/29/19 1346  TempSrc: Oral  PainSc:                  Nolon Nations

## 2019-09-29 NOTE — Interval H&P Note (Signed)
History and Physical Interval Note: 68/female with nausea, vomiting, abdominal pain, diarrhea, hematochezia, abnormal CT and C Diff Ag positivity for an EGD and colonoscopy.  09/29/2019 11:03 AM  Janet Peterson  has presented today for surgery, with the diagnosis of Nausea, abdominal pain, hematochezia.  The various methods of treatment have been discussed with the patient and family. After consideration of risks, benefits and other options for treatment, the patient has consented to  Procedure(s): ESOPHAGOGASTRODUODENOSCOPY (EGD) WITH PROPOFOL (N/A) COLONOSCOPY WITH PROPOFOL (N/A) as a surgical intervention.  The patient's history has been reviewed, patient examined, no change in status, stable for surgery.  I have reviewed the patient's chart and labs.  Questions were answered to the patient's satisfaction.     Ronnette Juniper

## 2019-09-29 NOTE — Op Note (Signed)
[facility] Patient Name: Janet Peterson Procedure Date: 09/29/2019 MRN: [id number] Attending MD: Ronnette Juniper , MD Date of Birth: 08-18-1950 CSN: [id number] Age: 69 Admit Type: Inpatient Procedure:                Upper GI endoscopy Indications:              Upper abdominal pain, Suspected gastro-esophageal                            reflux disease, Early satiety, Nausea with vomiting Providers:                Ronnette Juniper, MD, Yehuda Mao,                            Technician, Maudry Diego, CRNA Referring MD:             [facility] Medicines:                Monitored Anesthesia Care Complications:            No immediate complications. Estimated blood loss:                            Minimal. Estimated Blood Loss:     Estimated blood loss was minimal. Procedure:                Pre-Anesthesia Assessment:                           - Prior to the procedure, a History and Physical                            was performed, and patient medications and                            allergies were reviewed. The patient's tolerance of                            previous anesthesia was also reviewed. The risks                            and benefits of the procedure and the sedation                            options and risks were discussed with the patient.                            All questions were answered, and informed consent                            was obtained. Prior Anticoagulants: The patient has                            taken no previous anticoagulant or antiplatelet  agents. ASA Grade Assessment: III - A patient with                            severe systemic disease. After reviewing the risks                            and benefits, the patient was deemed in                            satisfactory condition to undergo the procedure.                           After obtaining informed consent, the endoscope was                             passed under direct vision. Throughout the                            procedure, the patient's blood pressure, pulse, and                            oxygen saturations were monitored continuously. The                            [id number] Olympus gastroscope was                            introduced through the mouth, and advanced to the                            second part of duodenum. The upper GI endoscopy was                            accomplished without difficulty. The patient                            tolerated the procedure well. Scope In: Scope Out: Findings:      A widely patent Schatzki ring was found at the gastroesophageal junction.      A 4 cm hiatal hernia was present.      A few small sessile polyps with no bleeding and no stigmata of recent       bleeding were found in the gastric fundus and in the gastric body.       Biopsies were taken with a cold forceps for histology.      The cardia and gastric fundus were normal on retroflexion.      Localized mildly erythematous mucosa without bleeding was found in the       gastric antrum. Biopsies were taken with a cold forceps for Helicobacter       pylori testing.      The examined duodenum was normal. Biopsies for histology were taken with       a cold forceps for evaluation of celiac disease. Impression:               - Widely patent Schatzki ring.                           -  4 cm hiatal hernia.                           - A few gastric polyps. Biopsied.                           - Erythematous mucosa in the antrum. Biopsied.                           - Normal examined duodenum. Biopsied. Moderate Sedation:      Patient did not receive moderate sedation for this procedure, but       instead received monitored anesthesia care. Recommendation:           - Full liquid diet.                           - Continue present medications.                           - Await pathology  results. Procedure Code(s):        --- Professional ---                           [id number], Esophagogastroduodenoscopy, flexible,                            transoral; with biopsy, single or multiple Diagnosis Code(s):        --- Professional ---                           K22.2, Esophageal obstruction                           K44.9, Diaphragmatic hernia without obstruction or                            gangrene                           K31.7, Polyp of stomach and duodenum                           K31.89, Other diseases of stomach and duodenum                           R10.10, Upper abdominal pain, unspecified                           R68.81, Early satiety                           R11.2, Nausea with vomiting, unspecified CPT copyright 2019 [employer]. All rights reserved. The codes documented in this report are preliminary and upon coder review may  be revised to meet current compliance requirements. Ronnette Juniper, MD 09/29/2019 12:02:26 PM This report has been signed electronically. Number of Addenda: 0

## 2019-09-29 NOTE — Care Management Important Message (Signed)
Important Message  Patient Details IM Letter given to Sharon Case Manager to present to the Patient Name: Janet Peterson MRN: [id number] Date of Birth: 08/15/1950   Medicare Important Message Given:  Yes     Kerin Salen 09/29/2019, 12:11 PM

## 2019-09-30 LAB — CBC WITH DIFFERENTIAL/PLATELET
Abs Immature Granulocytes: 0.03 10*3/uL (ref 0.00–0.07)
Basophils Absolute: 0 10*3/uL (ref 0.0–0.1)
Basophils Relative: 1 %
Eosinophils Absolute: 0.1 10*3/uL (ref 0.0–0.5)
Eosinophils Relative: 2 %
HCT: 31.7 % — ABNORMAL LOW (ref 36.0–46.0)
Hemoglobin: 10.2 g/dL — ABNORMAL LOW (ref 12.0–15.0)
Immature Granulocytes: 0 %
Lymphocytes Relative: 47 %
Lymphs Abs: 3.2 10*3/uL (ref 0.7–4.0)
MCH: 31.7 pg (ref 26.0–34.0)
MCHC: 32.2 g/dL (ref 30.0–36.0)
MCV: 98.4 fL (ref 80.0–100.0)
Monocytes Absolute: 0.7 10*3/uL (ref 0.1–1.0)
Monocytes Relative: 9 %
Neutro Abs: 2.8 10*3/uL (ref 1.7–7.7)
Neutrophils Relative %: 41 %
Platelets: 178 10*3/uL (ref 150–400)
RBC: 3.22 MIL/uL — ABNORMAL LOW (ref 3.87–5.11)
RDW: 14.6 % (ref 11.5–15.5)
WBC: 6.9 10*3/uL (ref 4.0–10.5)
nRBC: 0 % (ref 0.0–0.2)

## 2019-09-30 LAB — BASIC METABOLIC PANEL
Anion gap: 6 (ref 5–15)
BUN: <5 mg/dL — ABNORMAL LOW (ref 8–23)
CO2: 27 mmol/L (ref 22–32)
Calcium: 8.7 mg/dL — ABNORMAL LOW (ref 8.9–10.3)
Chloride: 107 mmol/L (ref 98–111)
Creatinine, Ser: 0.67 mg/dL (ref 0.44–1.00)
GFR calc Af Amer: >60 mL/min (ref >60)
GFR calc non Af Amer: >60 mL/min (ref >60)
Glucose, Bld: 117 mg/dL — ABNORMAL HIGH (ref 70–99)
Potassium: 3.6 mmol/L (ref 3.5–5.1)
Sodium: 140 mmol/L (ref 135–145)

## 2019-09-30 MED ORDER — OXYCODONE-ACETAMINOPHEN 5-325 MG PO TABS
1.0000 | ORAL_TABLET | Freq: Once | ORAL | Status: AC
  Administered 2019-09-30: 1 via ORAL
  Filled 2019-09-30: qty 1

## 2019-09-30 MED ORDER — FAMOTIDINE 20 MG PO TABS
20.0000 mg | ORAL_TABLET | Freq: Once | ORAL | Status: AC
  Administered 2019-09-30: 20 mg via ORAL
  Filled 2019-09-30: qty 1

## 2019-09-30 NOTE — Progress Notes (Signed)
PROGRESS NOTE    Janet Peterson  [id number] DOB: 1951-03-31 DOA: 09/28/2019 PCP: Janet Frizzle, MD    Brief Narrative:  Patient was admitted to the hospital with the working diagnosis of C diff colitis.  69 year old female who presented with abdominal pain and rectal bleeding.  She does have the significant past medical history for psoriatic arthritis, osteoarthrosis, fibromyalgia, degenerative disc disease, GERD, anxiety, depression and obstructive sleep apnea.  Patient complained bloody diarrhea x2 over the last 24 hours prior to hospitalization.  She has been experiencing dyspepsia and her antiacid therapy was increased to twice daily Prilosec by her gastroenterologist within the last month.  On her initial physical examination, blood pressure 150/80, heart rate 84, respiratory rate 13, oxygen saturation 95%.  Her lungs are clear to auscultation bilaterally, heart S1-S2 present and rhythmic, her abdomen had generalized tenderness, positive guarding, no lower extremity edema. Sodium 136, potassium 3.7, chloride 101, bicarb 25, glucose 122, BUN 10, creatinine 0.84, white count 7.7, hemoglobin 13.2, hematocrit 39.2, platelets 240.  Abdomen/pelvis CT scan with changes consistent with colitis involving the descending and sigmoid colon. SARS COVID 19 negative.   Patient was placed on IV antibiotic therapy, IV fluids, as needed antiemetics and analgesics. C diff antigen positive, C diff toxin negative, PCR positive.   Patient underwent colonoscopy finding congested, erythematous, friable, inflamed, ulcerated and thickened folds of the mucosa in the sigmoid and descending colon. Upper endoscopy with erythematous mucosa in the antrum.     Assessment & Plan:   Principal Problem:   C. difficile colitis Active Problems:   GERD (gastroesophageal reflux disease)   Psoriatic arthritis (HCC)   HLD (hyperlipidemia)   Colitis   Anxiety and depression   1. C diff colitis. Patient with no  bowel movement this am, last diarrhea episode last night, her wbc this am is 6,9.   Tolerating well antibiotic therapy with oral vancomycin, continue with as needed antiemetics and analgesics. Advance diet to regular and will hold on IV fluids for now.   2. Acute migraine headache. Symptoms improved with sumatriptan.  3. GERD. Antiacid therapy with famotidine, with good toleration.   4. Dyslipidemia. On ezetimbide.   5. Psoriatic arthritis. No current signs of acute flare. Patient on outpatient infusion of Simponi Aria.   6. Depression. On bupropion, clonazepam and fluvoxamine, with no confusion or agitation.   7. HTN, On irbesartan for blood pressure control. Blood pressure today 149/89 mmHg.   Status is: Inpatient  Remains inpatient appropriate because:Inpatient level of care appropriate due to severity of illness, severe c diff colitis requires inpatient monitoring and treatment, high risk for decompensation.    Dispo: The patient is from: Home              Anticipated d/c is to: Home              Anticipated d/c date is: 2 days              Patient currently is not medically stable to d/c.        DVT prophylaxis: Enoxaparin   Code Status:   full  Family Communication:  No family at the bedside      Consultants:   GI   Procedures:   Upper endoscopy and colonoscopy   Antimicrobials:   Oral vancomycin    Subjective: Patient with improvement of her symptoms but not yet back to baseline, this am with no bowel movement, las night has still diarrhea. No nausea or vomiting,  and diet has been advanced.   Objective: Vitals:   09/29/19 2328 09/30/19 0218 09/30/19 0633 09/30/19 1003  BP: 140/78 140/67 (!) 145/80 (!) 149/89  Pulse: (!) 107 (!) 102 90 96  Resp: 18 16 17 18   Temp: 98 F (36.7 C) 98.1 F (36.7 C) 98 F (36.7 C) 97.8 F (36.6 C)  TempSrc: Oral Oral Oral Oral  SpO2: 95% 94% 98% 97%  Weight:   94.1 kg   Height:        Intake/Output  Summary (Last 24 hours) at 09/30/2019 1102 Last data filed at 09/30/2019 1010 Gross per 24 hour  Intake 1102.5 ml  Output 300 ml  Net 802.5 ml   Filed Weights   09/28/19 1716 09/29/19 1028 09/30/19 0633  Weight: 90.7 kg 90.7 kg 94.1 kg    Examination:   General: Not in pain or dyspnea, deconditioned  Neurology: Awake and alert, non focal  E ENT: mild pallor, no icterus, oral mucosa moist Cardiovascular: No JVD. S1-S2 present, rhythmic, no gallops, rubs, or murmurs. No lower extremity edema. Pulmonary: positive breath sounds bilaterally, adequate air movement, no wheezing, rhonchi or rales. Gastrointestinal. Abdomen soft mild distended with no organomegaly, non tender, no rebound or guarding Skin. No rashes Musculoskeletal: no joint deformities     Data Reviewed:  CBC: Recent Labs  Lab 09/28/19 1351 09/29/19 0046 09/30/19 0517  WBC 7.7 9.0 6.9  NEUTROABS  --   --  2.8  HGB 13.2 11.7* 10.2*  HCT 39.2 34.9* 31.7*  MCV 96.1 95.1 98.4  PLT 240 204 0000000   Basic Metabolic Panel: Recent Labs  Lab 09/28/19 1351 09/30/19 0517  NA 136 140  K 3.7 3.6  CL 101 107  CO2 25 27  GLUCOSE 122* 117*  BUN 10 <5*  CREATININE 0.84 0.67  CALCIUM 10.0 8.7*   GFR: Estimated Creatinine Clearance: 73.9 mL/min (by C-G formula based on SCr of 0.67 mg/dL). Liver Function Tests: Recent Labs  Lab 09/28/19 1351  AST 19  ALT 14  ALKPHOS 94  BILITOT 0.5  PROT 6.8  ALBUMIN 3.9   Recent Labs  Lab 09/28/19 1351  LIPASE 21   No results for input(s): AMMONIA in the last 168 hours. Coagulation Profile: No results for input(s): INR, PROTIME in the last 168 hours. Cardiac Enzymes: No results for input(s): CKTOTAL, CKMB, CKMBINDEX, TROPONINI in the last 168 hours. BNP (last 3 results) No results for input(s): PROBNP in the last 8760 hours. HbA1C: No results for input(s): HGBA1C in the last 72 hours. CBG: No results for input(s): GLUCAP in the last 168 hours. Lipid Profile: No  results for input(s): CHOL, HDL, LDLCALC, TRIG, CHOLHDL, LDLDIRECT in the last 72 hours. Thyroid Function Tests: No results for input(s): TSH, T4TOTAL, FREET4, T3FREE, THYROIDAB in the last 72 hours. Anemia Panel: No results for input(s): VITAMINB12, FOLATE, FERRITIN, TIBC, IRON, RETICCTPCT in the last 72 hours.    Radiology Studies: I have reviewed all of the imaging during this hospital visit personally     Scheduled Meds: . acidophilus   Oral QPC breakfast  . buPROPion  300 mg Oral q morning - 10a  . ezetimibe  10 mg Oral QPC breakfast  . famotidine  20 mg Oral Daily  . fluvoxaMINE  100 mg Oral BID  . irbesartan  300 mg Oral Daily  . saccharomyces boulardii  500 mg Oral BID  . vancomycin  125 mg Oral QID   Continuous Infusions: . dextrose 5% lactated ringers 75 mL/hr at  09/30/19 0200     LOS: 2 days        Sebrina Kessner Gerome Apley, MD

## 2019-09-30 NOTE — Evaluation (Signed)
Physical Therapy Evaluation Patient Details Name: Janet Peterson MRN: [id number] DOB: January 27, 1951 Today's Date: 09/30/2019   History of Present Illness  69 yo female admitted with Cdiff. Hx of psoriatic arthritis, back sg, OA, fibromyalgia, DDD, anxiety, depression, sleep apnea  Clinical Impression  On eval, pt was Supv level for mobility. She walked ~400 feet around the unit. Mildly unsteady intermittently but no LOB. Pt tolerated distance well. Encouraged pt to have nursing walk with her again later today if able. Will follow during hospital stay.     Follow Up Recommendations No PT follow up    Equipment Recommendations  None recommended by PT    Recommendations for Other Services       Precautions / Restrictions Precautions Precautions: Fall Restrictions Weight Bearing Restrictions: No      Mobility  Bed Mobility Overal bed mobility: Modified Independent                Transfers Overall transfer level: Modified independent Equipment used: None Transfers: Sit to/from Stand              Ambulation/Gait Ambulation/Gait assistance: Supervision Gait Distance (Feet): 400 Feet Assistive device: None Gait Pattern/deviations: Step-through pattern;Decreased stride length     General Gait Details: for safety. mildly unsteady but no LOB.  Stairs            Wheelchair Mobility    Modified Rankin (Stroke Patients Only)       Balance Overall balance assessment: Mild deficits observed, not formally tested                                           Pertinent Vitals/Pain Pain Assessment: No/denies pain    Home Living Family/patient expects to be discharged to:: Private residence Living Arrangements: Spouse/significant other Available Help at Discharge: Family Type of Home: House Home Access: Stairs to enter Entrance Stairs-Rails: Right Entrance Stairs-Number of Steps: 3 Home Layout: One level Home Equipment: Environmental consultant - 2  wheels;Cane - single point      Prior Function Level of Independence: Independent               Hand Dominance        Extremity/Trunk Assessment   Upper Extremity Assessment Upper Extremity Assessment: Overall WFL for tasks assessed    Lower Extremity Assessment Lower Extremity Assessment: Generalized weakness    Cervical / Trunk Assessment Cervical / Trunk Assessment: Normal  Communication   Communication: No difficulties  Cognition Arousal/Alertness: Awake/alert Behavior During Therapy: WFL for tasks assessed/performed Overall Cognitive Status: Within Functional Limits for tasks assessed                                        General Comments      Exercises     Assessment/Plan    PT Assessment Patient needs continued PT services  PT Problem List Decreased mobility;Decreased balance       PT Treatment Interventions Gait training;Therapeutic activities;Therapeutic exercise;Patient/family education;Balance training;Functional mobility training    PT Goals (Current goals can be found in the Care Plan section)  Acute Rehab PT Goals Patient Stated Goal: none stated PT Goal Formulation: With patient Time For Goal Achievement: 10/14/19 Potential to Achieve Goals: Good    Frequency Min 3X/week   Barriers to discharge  Co-evaluation               AM-PAC PT "6 Clicks" Mobility  Outcome Measure Help needed turning from your back to your side while in a flat bed without using bedrails?: None Help needed moving from lying on your back to sitting on the side of a flat bed without using bedrails?: None Help needed moving to and from a bed to a chair (including a wheelchair)?: None Help needed standing up from a chair using your arms (e.g., wheelchair or bedside chair)?: None Help needed to walk in hospital room?: A Little Help needed climbing 3-5 steps with a railing? : A Little 6 Click Score: 22    End of Session Equipment  Utilized During Treatment: Gait belt Activity Tolerance: Patient tolerated treatment well Patient left: in bed;with call bell/phone within reach   PT Visit Diagnosis: Difficulty in walking, not elsewhere classified (R26.2)    Time: 1210-1227 PT Time Calculation (min) (ACUTE ONLY): 17 min   Charges:   PT Evaluation $PT Eval Low Complexity: 1 Low           Devonia Farro P, PT Acute Rehabilitation

## 2019-09-30 NOTE — Progress Notes (Signed)
Subjective: Patient is able to tolerate full liquid diet without further nausea, no vomiting. She has not had a bowel movement today. Reports improvement in abdominal pain.  Objective: Vital signs in last 24 hours: Temp:  [97.6 F (36.4 C)-98.9 F (37.2 C)] 98 F (36.7 C) (05/08 [id number]) Pulse Rate:  [90-107] 90 (05/08 0633) Resp:  [16-21] 17 (05/08 [id number]) BP: (126-160)/(59-87) 145/80 (05/08 0633) SpO2:  [93 %-100 %] 98 % (05/08 [id number]) Weight:  [90.7 kg-94.1 kg] 94.1 kg (05/08 [id number]) Weight change: -0.019 kg Last BM Date: 09/29/19  PE: Lying on bed, not in discomfort GENERAL: Mild pallor, no icterus ABDOMEN: Soft, slightly distended, nontender, normoactive bowel sounds EXTREMITIES: No deformity  Lab Results: Results for orders placed or performed during the hospital encounter of 09/28/19 (from the past 48 hour(s))  Lipase, blood     Status: None   Collection Time: 09/28/19  1:51 PM  Result Value Ref Range   Lipase 21 11 - 51 U/L    Comment: Performed at [facility], [place] [address] [place], [place] [address]  Comprehensive metabolic panel     Status: Abnormal   Collection Time: 09/28/19  1:51 PM  Result Value Ref Range   Sodium 136 135 - 145 mmol/L   Potassium 3.7 3.5 - 5.1 mmol/L   Chloride 101 98 - 111 mmol/L   CO2 25 22 - 32 mmol/L   Glucose, Bld 122 (H) 70 - 99 mg/dL    Comment: Glucose reference range applies only to samples taken after fasting for at least 8 hours.   BUN 10 8 - 23 mg/dL   Creatinine, Ser 0.84 0.44 - 1.00 mg/dL   Calcium 10.0 8.9 - 10.3 mg/dL   Total Protein 6.8 6.5 - 8.1 g/dL   Albumin 3.9 3.5 - 5.0 g/dL   AST 19 15 - 41 U/L   ALT 14 0 - 44 U/L   Alkaline Phosphatase 94 38 - 126 U/L   Total Bilirubin 0.5 0.3 - 1.2 mg/dL   GFR calc non Af Amer >60 >60 mL/min   GFR calc Af Amer >60 >60 mL/min   Anion gap 10 5 - 15    Comment: Performed at [facility], [place] [address] [place], [place] [address]  CBC     Status:  None   Collection Time: 09/28/19  1:51 PM  Result Value Ref Range   WBC 7.7 4.0 - 10.5 K/uL   RBC 4.08 3.87 - 5.11 MIL/uL   Hemoglobin 13.2 12.0 - 15.0 g/dL   HCT 39.2 36.0 - 46.0 %   MCV 96.1 80.0 - 100.0 fL   MCH 32.4 26.0 - 34.0 pg   MCHC 33.7 30.0 - 36.0 g/dL   RDW 14.1 11.5 - 15.5 %   Platelets 240 150 - 400 K/uL   nRBC 0.0 0.0 - 0.2 %    Comment: Performed at [facility], [place] [address], [place], [place] [address]  Urinalysis, Routine w reflex microscopic     Status: None   Collection Time: 09/28/19  7:40 PM  Result Value Ref Range   Color, Urine YELLOW YELLOW   APPearance CLEAR CLEAR   Specific Gravity, Urine 1.014 1.005 - 1.030   pH 6.0 5.0 - 8.0   Glucose, UA NEGATIVE NEGATIVE mg/dL   Hgb urine dipstick NEGATIVE NEGATIVE   Bilirubin Urine NEGATIVE NEGATIVE   Ketones, ur NEGATIVE NEGATIVE mg/dL   Protein, ur NEGATIVE NEGATIVE mg/dL   Nitrite NEGATIVE NEGATIVE  Leukocytes,Ua NEGATIVE NEGATIVE    Comment: Performed at [facility], [place] [address] [place], [place] [address]  Respiratory Panel by RT PCR (Flu A&B, Covid) - Nasopharyngeal Swab     Status: None   Collection Time: 09/28/19  8:55 PM   Specimen: Nasopharyngeal Swab  Result Value Ref Range   SARS Coronavirus 2 by RT PCR NEGATIVE NEGATIVE    Comment: (NOTE) SARS-CoV-2 target nucleic acids are NOT DETECTED. The SARS-CoV-2 RNA is generally detectable in upper respiratoy specimens during the acute phase of infection. The lowest concentration of SARS-CoV-2 viral copies this assay can detect is 131 copies/mL. A negative result does not preclude SARS-Cov-2 infection and should not be used as the sole basis for treatment or other patient management decisions. A negative result may occur with  improper specimen collection/handling, submission of specimen other than nasopharyngeal swab, presence of viral mutation(s) within the areas targeted by this assay, and inadequate number  of viral copies (<131 copies/mL). A negative result must be combined with clinical observations, patient history, and epidemiological information. The expected result is Negative. Fact Sheet for Patients:  PinkCheek.be Fact Sheet for Healthcare Providers:  GravelBags.it This test is not yet ap proved or cleared by the [place] FDA and  has been authorized for detection and/or diagnosis of SARS-CoV-2 by FDA under an Emergency Use Authorization (EUA). This EUA will remain  in effect (meaning this test can be used) for the duration of the COVID-19 declaration under Section 564(b)(1) of the Act, 21 U.S.C. section 360bbb-3(b)(1), unless the authorization is terminated or revoked sooner.    Influenza A by PCR NEGATIVE NEGATIVE   Influenza B by PCR NEGATIVE NEGATIVE    Comment: (NOTE) The Xpert Xpress SARS-CoV-2/FLU/RSV assay is intended as an aid in  the diagnosis of influenza from Nasopharyngeal swab specimens and  should not be used as a sole basis for treatment. Nasal washings and  aspirates are unacceptable for Xpert Xpress SARS-CoV-2/FLU/RSV  testing. Fact Sheet for Patients: PinkCheek.be Fact Sheet for Healthcare Providers: GravelBags.it This test is not yet approved or cleared by the [place] FDA and  has been authorized for detection and/or diagnosis of SARS-CoV-2 by  FDA under an Emergency Use Authorization (EUA). This EUA will remain  in effect (meaning this test can be used) for the duration of the  Covid-19 declaration under Section 564(b)(1) of the Act, 21  U.S.C. section 360bbb-3(b)(1), unless the authorization is  terminated or revoked. Performed at [facility], [place] [address], [place], [place] [address]   HIV Antibody (routine testing w rflx)     Status: None   Collection Time: 09/29/19 12:46 AM  Result Value Ref Range   HIV  Screen 4th Generation wRfx Non Reactive Non Reactive    Comment: Performed at [facility], [place] [address], [place], [place] [address]  Lactic acid, plasma     Status: None   Collection Time: 09/29/19 12:46 AM  Result Value Ref Range   Lactic Acid, Venous 1.3 0.5 - 1.9 mmol/L    Comment: Performed at [facility], [place] [address] [place], [place] [address]  CBC     Status: Abnormal   Collection Time: 09/29/19 12:46 AM  Result Value Ref Range   WBC 9.0 4.0 - 10.5 K/uL   RBC 3.67 (L) 3.87 - 5.11 MIL/uL   Hemoglobin 11.7 (L) 12.0 - 15.0 g/dL   HCT 34.9 (L) 36.0 - 46.0 %   MCV 95.1 80.0 -  100.0 fL   MCH 31.9 26.0 - 34.0 pg   MCHC 33.5 30.0 - 36.0 g/dL   RDW 14.1 11.5 - 15.5 %   Platelets 204 150 - 400 K/uL   nRBC 0.0 0.0 - 0.2 %    Comment: Performed at [facility], [place] [address], [place], [place] [address]  Occult blood card to lab, stool     Status: Abnormal   Collection Time: 09/29/19  4:03 AM  Result Value Ref Range   Fecal Occult Bld POSITIVE (A) NEGATIVE    Comment: Performed at [facility], [place] [address], [place], [place] [address]  C Difficile Quick Screen w PCR reflex     Status: Abnormal   Collection Time: 09/29/19  4:03 AM   Specimen: Stool  Result Value Ref Range   C Diff antigen POSITIVE (A) NEGATIVE   C Diff toxin NEGATIVE NEGATIVE   C Diff interpretation Results are indeterminate. See PCR results.     Comment: Performed at [facility], [place] [address] [place], [place] [address]  C. Diff by PCR, Reflexed     Status: Abnormal   Collection Time: 09/29/19  4:03 AM  Result Value Ref Range   Toxigenic C. Difficile by PCR POSITIVE (A) NEGATIVE    Comment: Positive for toxigenic C. difficile with little to no toxin production. Only treat if clinical presentation suggests symptomatic illness. Performed at [facility], [place] [address], [place], [place] [address]   Surgical pcr screen      Status: None   Collection Time: 09/29/19  9:22 AM   Specimen: Nasal Mucosa; Nasal Swab  Result Value Ref Range   MRSA, PCR NEGATIVE NEGATIVE   Staphylococcus aureus NEGATIVE NEGATIVE    Comment: (NOTE) The Xpert SA Assay (FDA approved for NASAL specimens in patients 78 years of age and older), is one component of a comprehensive surveillance program. It is not intended to diagnose infection nor to guide or monitor treatment. Performed at [facility], [place] [address], [place], [place] [address]   CBC with Differential/Platelet     Status: Abnormal   Collection Time: 09/30/19  5:17 AM  Result Value Ref Range   WBC 6.9 4.0 - 10.5 K/uL   RBC 3.22 (L) 3.87 - 5.11 MIL/uL   Hemoglobin 10.2 (L) 12.0 - 15.0 g/dL   HCT 31.7 (L) 36.0 - 46.0 %   MCV 98.4 80.0 - 100.0 fL   MCH 31.7 26.0 - 34.0 pg   MCHC 32.2 30.0 - 36.0 g/dL   RDW 14.6 11.5 - 15.5 %   Platelets 178 150 - 400 K/uL   nRBC 0.0 0.0 - 0.2 %   Neutrophils Relative % 41 %   Neutro Abs 2.8 1.7 - 7.7 K/uL   Lymphocytes Relative 47 %   Lymphs Abs 3.2 0.7 - 4.0 K/uL   Monocytes Relative 9 %   Monocytes Absolute 0.7 0.1 - 1.0 K/uL   Eosinophils Relative 2 %   Eosinophils Absolute 0.1 0.0 - 0.5 K/uL   Basophils Relative 1 %   Basophils Absolute 0.0 0.0 - 0.1 K/uL   Immature Granulocytes 0 %   Abs Immature Granulocytes 0.03 0.00 - 0.07 K/uL    Comment: Performed at [facility], [place] [address] [place], [place] [id number]  Basic metabolic panel     Status: Abnormal   Collection Time: 09/30/19  5:17 AM  Result Value Ref Range   Sodium 140 135 -  145 mmol/L   Potassium 3.6 3.5 - 5.1 mmol/L   Chloride 107 98 - 111 mmol/L   CO2 27 22 - 32 mmol/L   Glucose, Bld 117 (H) 70 - 99 mg/dL    Comment: Glucose reference range applies only to samples taken after fasting for at least 8 hours.   BUN <5 (L) 8 - 23 mg/dL   Creatinine, Ser 0.67 0.44 - 1.00 mg/dL   Calcium 8.7 (L) 8.9 - 10.3 mg/dL   GFR  calc non Af Amer >60 >60 mL/min   GFR calc Af Amer >60 >60 mL/min   Anion gap 6 5 - 15    Comment: Performed at [facility], [place] [address] [place], [place] [address]    Studies/Results: CT ABDOMEN PELVIS W CONTRAST  Result Date: 09/28/2019 CLINICAL DATA:  Nonlocalized upper and mid abdominal pain for the past 2 months. Intermittent nausea, vomiting and diarrhea for the past 3 months. Bright red blood in the stool since last night. EXAM: CT ABDOMEN AND PELVIS WITH CONTRAST TECHNIQUE: Multidetector CT imaging of the abdomen and pelvis was performed using the standard protocol following bolus administration of intravenous contrast. CONTRAST:  170mL OMNIPAQUE IOHEXOL 300 MG/ML  SOLN COMPARISON:  06/28/2013. FINDINGS: Lower chest: Minimal linear scarring/atelectasis at both lung bases. Normal sized heart. Hepatobiliary: Minimal diffuse low density of the liver relative to the spleen. Normal appearing gallbladder. Pancreas: Unremarkable. No pancreatic ductal dilatation or surrounding inflammatory changes. Spleen: Normal in size without focal abnormality. Adrenals/Urinary Tract: Stable 2.6 cm fat density left adrenal mass. Normal appearing right adrenal gland. Mild bowel rotation of the right kidney. Normal appearing left kidney, ureters and urinary bladder. Stomach/Bowel: Mild diffuse low density wall thickening involving the descending and sigmoid colon. Minimal associated pericolonic soft tissue stranding. Unremarkable stomach, small bowel and appendix. Vascular/Lymphatic: Atheromatous arterial calcifications without aneurysm. No enlarged lymph nodes. Reproductive: Uterus and bilateral adnexa are unremarkable. Other: Small umbilical hernia containing fat. Musculoskeletal: Screw and rod fixation from the lower thoracic spine to the upper sacrum. Old, healed burst fracture of the T10 vertebral body with approximately 40% compression. IMPRESSION: 1. Mild changes of colitis involving the  descending and sigmoid colon. This is most likely infectious or inflammatory in nature. There are no CT findings to indicate and ischemic cause. 2. Minimal diffuse hepatic steatosis. 3. Stable left adrenal myelolipoma. Electronically Signed   By: Claudie Revering M.D.   On: 09/28/2019 18:51    Medications: I have reviewed the patient's current medications.  Assessment: C. difficile antigen and toxin positive with colitis noted in descending and sigmoid colon, biopsies taken, results pending No leukocytosis, hemoglobin stable at 10.2  Plan: Regular diet today evening if tolerated Continue vancomycin 125 mg 4 times a day along with probiotic currently on acidophilus after breakfast and Florastor 500 mg twice daily. We will continue to monitor  Janet Juniper, MD 09/30/2019, 9:50 AM

## 2019-10-01 LAB — GI PATHOGEN PANEL BY PCR, STOOL
Adenovirus F 40/41: NOT DETECTED
Astrovirus: NOT DETECTED
Campylobacter by PCR: NOT DETECTED
Cryptosporidium by PCR: NOT DETECTED
Cyclospora cayetanensis: NOT DETECTED
E coli (ETEC) LT/ST: DETECTED — AB
E coli (STEC): NOT DETECTED
Entamoeba histolytica: NOT DETECTED
Enteroaggregative E coli: NOT DETECTED
Enteropathogenic E coli: NOT DETECTED
G lamblia by PCR: NOT DETECTED
Norovirus GI/GII: NOT DETECTED
Plesiomonas shigelloides: NOT DETECTED
Rotavirus A by PCR: NOT DETECTED
Salmonella by PCR: NOT DETECTED
Sapovirus: NOT DETECTED
Shigella by PCR: NOT DETECTED
Vibrio cholerae: NOT DETECTED
Vibrio: NOT DETECTED
Yersinia enterocolitica: NOT DETECTED

## 2019-10-01 LAB — CBC WITH DIFFERENTIAL/PLATELET
Abs Immature Granulocytes: 0.02 10*3/uL (ref 0.00–0.07)
Basophils Absolute: 0.1 10*3/uL (ref 0.0–0.1)
Basophils Relative: 1 %
Eosinophils Absolute: 0.2 10*3/uL (ref 0.0–0.5)
Eosinophils Relative: 3 %
HCT: 32.8 % — ABNORMAL LOW (ref 36.0–46.0)
Hemoglobin: 10.7 g/dL — ABNORMAL LOW (ref 12.0–15.0)
Immature Granulocytes: 0 %
Lymphocytes Relative: 52 %
Lymphs Abs: 3.7 10*3/uL (ref 0.7–4.0)
MCH: 32.1 pg (ref 26.0–34.0)
MCHC: 32.6 g/dL (ref 30.0–36.0)
MCV: 98.5 fL (ref 80.0–100.0)
Monocytes Absolute: 0.7 10*3/uL (ref 0.1–1.0)
Monocytes Relative: 10 %
Neutro Abs: 2.4 10*3/uL (ref 1.7–7.7)
Neutrophils Relative %: 34 %
Platelets: 194 10*3/uL (ref 150–400)
RBC: 3.33 MIL/uL — ABNORMAL LOW (ref 3.87–5.11)
RDW: 14.4 % (ref 11.5–15.5)
WBC: 7 10*3/uL (ref 4.0–10.5)
nRBC: 0 % (ref 0.0–0.2)

## 2019-10-01 LAB — BASIC METABOLIC PANEL
Anion gap: 7 (ref 5–15)
BUN: <5 mg/dL — ABNORMAL LOW (ref 8–23)
CO2: 29 mmol/L (ref 22–32)
Calcium: 8.9 mg/dL (ref 8.9–10.3)
Chloride: 104 mmol/L (ref 98–111)
Creatinine, Ser: 0.62 mg/dL (ref 0.44–1.00)
GFR calc Af Amer: >60 mL/min (ref >60)
GFR calc non Af Amer: >60 mL/min (ref >60)
Glucose, Bld: 100 mg/dL — ABNORMAL HIGH (ref 70–99)
Potassium: 3.4 mmol/L — ABNORMAL LOW (ref 3.5–5.1)
Sodium: 140 mmol/L (ref 135–145)

## 2019-10-01 MED ORDER — FAMOTIDINE 20 MG PO TABS: 20.0000 mg | ORAL_TABLET | Freq: Every day | ORAL | 0 refills | Status: DC

## 2019-10-01 MED ORDER — VANCOMYCIN 50 MG/ML ORAL SOLUTION: 125.0000 mg | Freq: Four times a day (QID) | ORAL | 0 refills | Status: AC

## 2019-10-01 NOTE — Progress Notes (Signed)
Subjective: Patient denies further diarrhea.  She states her last bowel movement was formed.  She denies abdominal pain.  She is requesting to go home.  Objective: Vital signs in last 24 hours: Temp:  [98 F (36.7 C)-98.4 F (36.9 C)] 98 F (36.7 C) (05/09 0513) Pulse Rate:  [92-103] 92 (05/09 0513) Resp:  [16] 16 (05/09 0513) BP: (141-162)/(82-89) 141/89 (05/09 0513) SpO2:  [94 %-97 %] 97 % (05/09 0513) Weight change:  Last BM Date: 09/29/19  PE: Sitting doing comfortably on bedside chair GENERAL: Able to speak in full sentences, mild pallor no icterus ABDOMEN: Nondistended, nontender EXTREMITIES: No deformity  Lab Results: Results for orders placed or performed during the hospital encounter of 09/28/19 (from the past 48 hour(s))  CBC with Differential/Platelet     Status: Abnormal   Collection Time: 09/30/19  5:17 AM  Result Value Ref Range   WBC 6.9 4.0 - 10.5 K/uL   RBC 3.22 (L) 3.87 - 5.11 MIL/uL   Hemoglobin 10.2 (L) 12.0 - 15.0 g/dL   HCT 31.7 (L) 36.0 - 46.0 %   MCV 98.4 80.0 - 100.0 fL   MCH 31.7 26.0 - 34.0 pg   MCHC 32.2 30.0 - 36.0 g/dL   RDW 14.6 11.5 - 15.5 %   Platelets 178 150 - 400 K/uL   nRBC 0.0 0.0 - 0.2 %   Neutrophils Relative % 41 %   Neutro Abs 2.8 1.7 - 7.7 K/uL   Lymphocytes Relative 47 %   Lymphs Abs 3.2 0.7 - 4.0 K/uL   Monocytes Relative 9 %   Monocytes Absolute 0.7 0.1 - 1.0 K/uL   Eosinophils Relative 2 %   Eosinophils Absolute 0.1 0.0 - 0.5 K/uL   Basophils Relative 1 %   Basophils Absolute 0.0 0.0 - 0.1 K/uL   Immature Granulocytes 0 %   Abs Immature Granulocytes 0.03 0.00 - 0.07 K/uL    Comment: Performed at [facility], [place] [address], [place], [place] [id number]  Basic metabolic panel     Status: Abnormal   Collection Time: 09/30/19  5:17 AM  Result Value Ref Range   Sodium 140 135 - 145 mmol/L   Potassium 3.6 3.5 - 5.1 mmol/L   Chloride 107 98 - 111 mmol/L   CO2 27 22 - 32 mmol/L   Glucose, Bld 117 (H) 70  - 99 mg/dL    Comment: Glucose reference range applies only to samples taken after fasting for at least 8 hours.   BUN <5 (L) 8 - 23 mg/dL   Creatinine, Ser 0.67 0.44 - 1.00 mg/dL   Calcium 8.7 (L) 8.9 - 10.3 mg/dL   GFR calc non Af Amer >60 >60 mL/min   GFR calc Af Amer >60 >60 mL/min   Anion gap 6 5 - 15    Comment: Performed at [facility], [place] [address], [place], [place] [address]  CBC with Differential/Platelet     Status: Abnormal   Collection Time: 10/01/19  4:33 AM  Result Value Ref Range   WBC 7.0 4.0 - 10.5 K/uL   RBC 3.33 (L) 3.87 - 5.11 MIL/uL   Hemoglobin 10.7 (L) 12.0 - 15.0 g/dL   HCT 32.8 (L) 36.0 - 46.0 %   MCV 98.5 80.0 - 100.0 fL   MCH 32.1 26.0 - 34.0 pg   MCHC 32.6 30.0 - 36.0 g/dL   RDW 14.4 11.5 - 15.5 %   Platelets 194 150 - 400 K/uL   nRBC  0.0 0.0 - 0.2 %   Neutrophils Relative % 34 %   Neutro Abs 2.4 1.7 - 7.7 K/uL   Lymphocytes Relative 52 %   Lymphs Abs 3.7 0.7 - 4.0 K/uL   Monocytes Relative 10 %   Monocytes Absolute 0.7 0.1 - 1.0 K/uL   Eosinophils Relative 3 %   Eosinophils Absolute 0.2 0.0 - 0.5 K/uL   Basophils Relative 1 %   Basophils Absolute 0.1 0.0 - 0.1 K/uL   Immature Granulocytes 0 %   Abs Immature Granulocytes 0.02 0.00 - 0.07 K/uL    Comment: Performed at [facility], [place] [address], [place], [place] [id number]  Basic metabolic panel     Status: Abnormal   Collection Time: 10/01/19  4:33 AM  Result Value Ref Range   Sodium 140 135 - 145 mmol/L   Potassium 3.4 (L) 3.5 - 5.1 mmol/L   Chloride 104 98 - 111 mmol/L   CO2 29 22 - 32 mmol/L   Glucose, Bld 100 (H) 70 - 99 mg/dL    Comment: Glucose reference range applies only to samples taken after fasting for at least 8 hours.   BUN <5 (L) 8 - 23 mg/dL   Creatinine, Ser 0.62 0.44 - 1.00 mg/dL   Calcium 8.9 8.9 - 10.3 mg/dL   GFR calc non Af Amer >60 >60 mL/min   GFR calc Af Amer >60 >60 mL/min   Anion gap 7 5 - 15    Comment: Performed at  [facility], [place] [address] [place], [place] [address]    Studies/Results: No results found.  Medications: I have reviewed the patient's current medications.  Assessment: C. difficile colitis No leukocytosis, mild normocytic anemia, normal renal function, mild hypokalemia  Plan: On regular diet, okay to DC home on vancomycin 125 mg 4 times a day for 10 days. Biopsies from descending and sigmoid to be followed as an outpatient.  Ronnette Juniper, MD 10/01/2019, 11:52 AM

## 2019-10-01 NOTE — Progress Notes (Signed)
Reviewed AVS with patient.   Asked and answered all questions.  Alert and oriented.  Skin Warm and dry.  Transported to main lobby via wheelchair and turned over to her husband without concerns or complaints.

## 2019-10-01 NOTE — Evaluation (Signed)
Occupational Therapy Evaluation Patient Details Name: Janet Peterson MRN: [id number] DOB: Aug 06, 1950 Today's Date: 10/01/2019    History of Present Illness 69 yo female admitted with Cdiff. Hx of psoriatic arthritis, back sg, OA, fibromyalgia, DDD, anxiety, depression, sleep apnea   Clinical Impression   Janet Peterson is a 69 year old woman admitted to hospital with c.diff. On evaluation she presents with generalized weakness that she reports is her baseline but ability to perform functional mobility and ADLs without complaints of fatigue. Patient reports no needs and that she is at her baseline. No OT needs at this time. Patient to safe to go home at discharge.    Follow Up Recommendations  No OT follow up    Equipment Recommendations       Recommendations for Other Services       Precautions / Restrictions Precautions Precautions: None Restrictions Weight Bearing Restrictions: No      Mobility Bed Mobility               General bed mobility comments: Patient able to perform bed mobility without assistance.  Transfers                 General transfer comment: Patient able to perform transfer to recliner and in room ambulation without assistance. No balance deficits noted or unsafe movement. Patient reports feeling like her "normal."    Balance Overall balance assessment: No apparent balance deficits (not formally assessed)                                         ADL either performed or assessed with clinical judgement   ADL Overall ADL's : At baseline                                       General ADL Comments: Patient reports performing independent toileting, demonstrates mobility and physical abilities to perform all ADL tasks. Patient reports already brushing teeth and washing face with no difficulties. Patient able to to donn socks - though reports this is a baseline difficulty due to prior back surgeries. Patient able  to cross legs and put on socks.     Vision   Vision Assessment?: No apparent visual deficits     Perception     Praxis      Pertinent Vitals/Pain Pain Assessment: No/denies pain     Hand Dominance Right   Extremity/Trunk Assessment Upper Extremity Assessment Upper Extremity Assessment: Generalized weakness(Patient reports long standing generalized weakness. Reports multiple bouts of therapy that haven't improved upper extremity strength.)   Lower Extremity Assessment Lower Extremity Assessment: Defer to PT evaluation   Cervical / Trunk Assessment Cervical / Trunk Assessment: Normal   Communication Communication Communication: No difficulties   Cognition Arousal/Alertness: Awake/alert Behavior During Therapy: WFL for tasks assessed/performed Overall Cognitive Status: Within Functional Limits for tasks assessed                                     General Comments       Exercises     Shoulder Instructions      Home Living Family/patient expects to be discharged to:: Private residence   Available Help at Discharge: Family Type of Home: House  Entrance Stairs-Number of Steps: 3 Entrance Stairs-Rails: Right Home Layout: One level     Bathroom Shower/Tub: Occupational psychologist: Handicapped height Bathroom Accessibility: No   Home Equipment: Environmental consultant - 2 wheels;Cane - single point          Prior Functioning/Environment Level of Independence: Independent                 OT Problem List: Decreased strength      OT Treatment/Interventions:      OT Goals(Current goals can be found in the care plan section)    OT Frequency:     Barriers to D/C:            Co-evaluation              AM-PAC OT "6 Clicks" Daily Activity     Outcome Measure Help from another person eating meals?: None Help from another person taking care of personal grooming?: None Help from another person toileting, which includes using toliet,  bedpan, or urinal?: None Help from another person bathing (including washing, rinsing, drying)?: None Help from another person to put on and taking off regular upper body clothing?: None Help from another person to put on and taking off regular lower body clothing?: None 6 Click Score: 24   End of Session Nurse Communication: Mobility status  Activity Tolerance: Patient tolerated treatment well Patient left: in chair  OT Visit Diagnosis: Muscle weakness (generalized) (M62.81)                Time: [id number] OT Time Calculation (min): 18 min Charges:  OT General Charges $OT Visit: 1 Visit OT Evaluation $OT Eval Low Complexity: 1 Low  Dylann Layne, OTR/L Acute Care Rehab Services  Office [place] 10/01/2019, 9:59 AM

## 2019-10-01 NOTE — Discharge Summary (Addendum)
Physician Discharge Summary  Janet Peterson [id number] DOB: 04-Dec-1950 DOA: 09/28/2019  PCP: Susy Frizzle, MD  Admit date: 09/28/2019 Discharge date: 10/01/2019  Admitted From: Home  Disposition:  Home   Recommendations for Outpatient Follow-up and new medication changes:  1. Follow up with Dr. Dennard Schaumann in 7 days.  2. Continue with oral vancomycin for 10 more days. 3. Omeprazole changed to famotidine (to prevent c diff)  Home Health: no   Equipment/Devices: no    Discharge Condition: stable  CODE STATUS: full  Diet recommendation: heart healthy   Brief/Interim Summary: Patient was admitted to the hospital with the working diagnosis of C diff colitis/ diarrhea.  69 year old female who presented with abdominal pain and rectal bleeding. She does have the significant past medical history for psoriatic arthritis, osteoarthrosis, fibromyalgia, degenerative disc disease, GERD, anxiety, depression and obstructive sleep apnea. Patient complainedbloody diarrhea x2 over the last 24 hours prior to hospitalization. She has been experiencing dyspepsia andher antiacid therapy was increased to twice daily (Prilosec) by her gastroenterologist within the last month. On her initial physical examination, blood pressure 150/80, heart rate 84, respiratory rate 13, oxygen saturation 95%.Her lungs were clear to auscultation bilaterally, heart S1-S2 present and rhythmic, her abdomen had generalized tenderness, positive guarding, no lower extremity edema. Sodium 136, potassium 3.7, chloride 101, bicarb 25, glucose 122, BUN 10, creatinine 0.84, white count 7.7, hemoglobin 13.2, hematocrit 39.2, platelets 240.Abdomen/pelvis CT scan with changes consistent with colitis involving the descending and sigmoid colon. SARS COVID 19 negative.  EKG 81 bpm, normal axis, normal intervals, sinus rhythm, no ST segment changes, negative T wave V2 through V6.  Patient was placed intialy on IV antibiotic therapy, IV  fluids, as needed antiemetics and analgesics. C diff antigen positive, C diff toxin negative, PCR positive. IV antibiotic therapy was discontinued and patient was placed on oral vancomycin.   Patient underwent colonoscopy finding: congested, erythematous, friable, inflamed, ulcerated and thickened folds of the mucosa in the sigmoid and descending colon. Upper endoscopy with erythematous mucosa in the antrum.  Patient's symptoms continue to improve and by 05/09 her diarrhea had resolved.   1.  Acute C. difficile colitis/diarrhea.  Patient was admitted to the medical ward, she received intravenous fluids and initialy IV antibiotic therapy.  Once the diagnosis of C. difficile was confirmed IV antibiotic therapy was discontinued and she received monotherapy with oral vancomycin.  Currently her pathology report is still pending but clinical picture consistent with C. difficile colitis.  Her discharge white cell count is 7.0, patient will continue taking oral vancomycin for the next 10 days.  2.  Acute migraine headache.  Patient received 1 dose of sumatriptan with improvement of her symptoms.  3.  GERD (mild chronic inactive gastritis per pathology).  Proton pump inhibitor has been exchanged to H2 blocker to prevent further C. difficile infections.  4.  Hypertension/dyslipidemia.  Patient will continue valsartan and metoprolol for blood pressure control, continue ezetimibe.  5.  Psoriatic arthritis.  No signs of acute flare, continue Simponi Aria infusions as an outpatient. Continue as needed naproxen and oxycodone.   6.  Depression.  Continue bupropion, clonazepam and fluvoxamine.  Discharge Diagnoses:  Principal Problem:   C. difficile colitis Active Problems:   GERD (gastroesophageal reflux disease)   Psoriatic arthritis ([place])   HLD (hyperlipidemia)   Colitis   Anxiety and depression    Discharge Instructions   Allergies as of 10/01/2019      Reactions   Latex Rash   And  Mouth  sores   Amlodipine Besy-benazepril Hcl Other (See Comments)   COUGH   Augmentin [amoxicillin-pot Clavulanate] Diarrhea, Nausea And Vomiting   Bextra [valdecoxib] Diarrhea, Nausea And Vomiting   REFLUX   Chlorhexidine    rash   Lipitor [atorvastatin Calcium] Other (See Comments)   MYALGIAS   Sulfa Antibiotics Nausea And Vomiting   CRAMPING   Zocor [simvastatin] Other (See Comments)   MYALGIA      Medication List    STOP taking these medications   amLODipine 5 MG tablet Commonly known as: NORVASC   omeprazole 40 MG capsule Commonly known as: PRILOSEC     TAKE these medications   buPROPion 300 MG 24 hr tablet Commonly known as: WELLBUTRIN XL Take 300 mg by mouth every morning.   clonazePAM 1 MG tablet Commonly known as: KLONOPIN TAKE 1 TABLET BY MOUTH EVERY DAY AS NEEDED FOR ANXIETY What changed:   how much to take  how to take this  when to take this  reasons to take this  additional instructions   ezetimibe 10 MG tablet Commonly known as: ZETIA TAKE 1 TABLET BY MOUTH EVERY DAY What changed: when to take this   famotidine 20 MG tablet Commonly known as: PEPCID Take 1 tablet (20 mg total) by mouth daily. Start taking on: Oct 02, 2019   fluticasone 50 MCG/ACT nasal spray Commonly known as: FLONASE Place 2 sprays into both nostrils daily as needed for allergies.   fluvoxaMINE 100 MG tablet Commonly known as: LUVOX Take 100 mg by mouth 2 (two) times daily.   metoprolol succinate 50 MG 24 hr tablet Commonly known as: TOPROL-XL TAKE 1 TABLET BY MOUTH ONCE DAILY FOLLOWING A MEAL What changed: See the new instructions.   naproxen sodium 220 MG tablet Commonly known as: ALEVE Take 440 mg by mouth 2 (two) times daily as needed (pain).   oxyCODONE-acetaminophen 5-325 MG tablet Commonly known as: Percocet Take 1 tablet by mouth every 4 (four) hours as needed. What changed: when to take this   PROBIOTIC PO Take 1 tablet by mouth daily after breakfast.    SIMPONI ARIA IV Inject into the vein every 8 (eight) weeks.   valsartan 320 MG tablet Commonly known as: DIOVAN TAKE 1 TABLET BY MOUTH EVERY DAY What changed: when to take this   vancomycin 50 mg/mL  oral solution Commonly known as: VANCOCIN Take 2.5 mLs (125 mg total) by mouth 4 (four) times daily for 10 days.      Follow-up Information    Susy Frizzle, MD Follow up in 1 week(s).   Specialty: Family Medicine Contact information: [id number] [place] [address] [phone]          Allergies  Allergen Reactions  . Latex Rash    And Mouth sores  . Amlodipine Besy-Benazepril Hcl Other (See Comments)    COUGH  . Augmentin [Amoxicillin-Pot Clavulanate] Diarrhea and Nausea And Vomiting  . Bextra [Valdecoxib] Diarrhea and Nausea And Vomiting    REFLUX  . Chlorhexidine     rash  . Lipitor [Atorvastatin Calcium] Other (See Comments)    MYALGIAS  . Sulfa Antibiotics Nausea And Vomiting    CRAMPING  . Zocor [Simvastatin] Other (See Comments)    MYALGIA    Consultations:  GI    Procedures/Studies: US Abdomen Complete  Result Date: 09/12/2019 CLINICAL DATA:  Epigastric pain. EXAM: ABDOMEN ULTRASOUND COMPLETE COMPARISON:  CT scan of the abdomen dated 06/28/2013 FINDINGS: Gallbladder: No gallstones or  wall thickening visualized. No sonographic Murphy sign noted by sonographer. Common bile duct: Diameter: 5.7 mm, normal. Liver: No focal lesion identified. Diffuse increased echogenicity of the liver parenchyma consistent with hepatic steatosis. Portal vein is patent on color Doppler imaging with normal direction of blood flow towards the liver. IVC: No abnormality visualized. Pancreas: Visualized portion unremarkable. Spleen: Size and appearance within normal limits. Right Kidney: Length: 9.8 cm. Echogenicity within normal limits. No mass or hydronephrosis visualized. Left Kidney: Length: 10.8 cm. Echogenicity within normal limits. No mass or hydronephrosis  visualized. Abdominal aorta: No aneurysm visualized. Other findings: None. IMPRESSION: No acute abnormalities. Hepatic steatosis. Electronically Signed   By: Lorriane Shire M.D.   On: 09/12/2019 08:54   CT ABDOMEN PELVIS W CONTRAST  Result Date: 09/28/2019 CLINICAL DATA:  Nonlocalized upper and mid abdominal pain for the past 2 months. Intermittent nausea, vomiting and diarrhea for the past 3 months. Bright red blood in the stool since last night. EXAM: CT ABDOMEN AND PELVIS WITH CONTRAST TECHNIQUE: Multidetector CT imaging of the abdomen and pelvis was performed using the standard protocol following bolus administration of intravenous contrast. CONTRAST:  121mL OMNIPAQUE IOHEXOL 300 MG/ML  SOLN COMPARISON:  06/28/2013. FINDINGS: Lower chest: Minimal linear scarring/atelectasis at both lung bases. Normal sized heart. Hepatobiliary: Minimal diffuse low density of the liver relative to the spleen. Normal appearing gallbladder. Pancreas: Unremarkable. No pancreatic ductal dilatation or surrounding inflammatory changes. Spleen: Normal in size without focal abnormality. Adrenals/Urinary Tract: Stable 2.6 cm fat density left adrenal mass. Normal appearing right adrenal gland. Mild bowel rotation of the right kidney. Normal appearing left kidney, ureters and urinary bladder. Stomach/Bowel: Mild diffuse low density wall thickening involving the descending and sigmoid colon. Minimal associated pericolonic soft tissue stranding. Unremarkable stomach, small bowel and appendix. Vascular/Lymphatic: Atheromatous arterial calcifications without aneurysm. No enlarged lymph nodes. Reproductive: Uterus and bilateral adnexa are unremarkable. Other: Small umbilical hernia containing fat. Musculoskeletal: Screw and rod fixation from the lower thoracic spine to the upper sacrum. Old, healed burst fracture of the T10 vertebral body with approximately 40% compression. IMPRESSION: 1. Mild changes of colitis involving the descending and  sigmoid colon. This is most likely infectious or inflammatory in nature. There are no CT findings to indicate and ischemic cause. 2. Minimal diffuse hepatic steatosis. 3. Stable left adrenal myelolipoma. Electronically Signed   By: Claudie Revering M.D.   On: 09/28/2019 18:51   DG UGI W DOUBLE CM (HD BA)  Result Date: 09/12/2019 CLINICAL DATA:  Epigastric pain. EXAM: UPPER GI SERIES WITH KUB TECHNIQUE: After obtaining a scout radiograph a routine upper GI series was performed using thin and high density barium. FLUOROSCOPY TIME:  Fluoroscopy Time:  1 minutes 30 seconds COMPARISON:  None. FINDINGS: The KUB demonstrates a normal bowel gas pattern. Moderate stool in the nondistended colon. Extensive thoracolumbar and SI joint fusion hardware in place. The mucosa and motility of the esophagus are normal. There is a 6 cm hiatal hernia. No discrete esophagitis or esophageal stricture. The fundus, body, and antrum of the stomach are normal. The pylorus and duodenal bulb and C-loop and proximal jejunum are also normal. IMPRESSION: 1. 6 cm hiatal hernia. 2. Otherwise normal upper GI. 3. Moderate stool in the nondistended colon. Electronically Signed   By: Lorriane Shire M.D.   On: 09/12/2019 08:52      Procedures: upper endoscopy and colonoscopy   Subjective: Patient is feeling better, no nausea or vomiting, no abdominal pain, no further diarrhea, last bowel movement was  formed. No dyspnea or chest pain,   Discharge Exam: Vitals:   09/30/19 2034 10/01/19 0513  BP: (!) 162/85 (!) 141/89  Pulse: (!) 103 92  Resp: 16 16  Temp: 98.4 F (36.9 C) 98 F (36.7 C)  SpO2: 97% 97%   Vitals:   09/30/19 1003 09/30/19 1443 09/30/19 2034 10/01/19 0513  BP: (!) 149/89 (!) 149/82 (!) 162/85 (!) 141/89  Pulse: 96 (!) 101 (!) 103 92  Resp: 18 16 16 16   Temp: 97.8 F (36.6 C) 98.2 F (36.8 C) 98.4 F (36.9 C) 98 F (36.7 C)  TempSrc: Oral Oral Oral Oral  SpO2: 97% 94% 97% 97%  Weight:      Height:         General: Not in pain or dyspnea.  Neurology: Awake and alert, non focal  E ENT: mild pallor, no icterus, oral mucosa moist Cardiovascular: No JVD. S1-S2 present, rhythmic, no gallops, rubs, or murmurs. No lower extremity edema. Pulmonary: positive breath sounds bilaterally, adequate air movement, no wheezing, rhonchi or rales. Gastrointestinal. Abdomen soft with no organomegaly, non tender, no rebound or guarding Skin. No rashes Musculoskeletal: no joint deformities   The results of significant diagnostics from this hospitalization (including imaging, microbiology, ancillary and laboratory) are listed below for reference.     Microbiology: Recent Results (from the past 240 hour(s))  Respiratory Panel by RT PCR (Flu A&B, Covid) - Nasopharyngeal Swab     Status: None   Collection Time: 09/28/19  8:55 PM   Specimen: Nasopharyngeal Swab  Result Value Ref Range Status   SARS Coronavirus 2 by RT PCR NEGATIVE NEGATIVE Final    Comment: (NOTE) SARS-CoV-2 target nucleic acids are NOT DETECTED. The SARS-CoV-2 RNA is generally detectable in upper respiratoy specimens during the acute phase of infection. The lowest concentration of SARS-CoV-2 viral copies this assay can detect is 131 copies/mL. A negative result does not preclude SARS-Cov-2 infection and should not be used as the sole basis for treatment or other patient management decisions. A negative result may occur with  improper specimen collection/handling, submission of specimen other than nasopharyngeal swab, presence of viral mutation(s) within the areas targeted by this assay, and inadequate number of viral copies (<131 copies/mL). A negative result must be combined with clinical observations, patient history, and epidemiological information. The expected result is Negative. Fact Sheet for Patients:  PinkCheek.be Fact Sheet for Healthcare Providers:   GravelBags.it This test is not yet ap proved or cleared by the [place] FDA and  has been authorized for detection and/or diagnosis of SARS-CoV-2 by FDA under an Emergency Use Authorization (EUA). This EUA will remain  in effect (meaning this test can be used) for the duration of the COVID-19 declaration under Section 564(b)(1) of the Act, 21 U.S.C. section 360bbb-3(b)(1), unless the authorization is terminated or revoked sooner.    Influenza A by PCR NEGATIVE NEGATIVE Final   Influenza B by PCR NEGATIVE NEGATIVE Final    Comment: (NOTE) The Xpert Xpress SARS-CoV-2/FLU/RSV assay is intended as an aid in  the diagnosis of influenza from Nasopharyngeal swab specimens and  should not be used as a sole basis for treatment. Nasal washings and  aspirates are unacceptable for Xpert Xpress SARS-CoV-2/FLU/RSV  testing. Fact Sheet for Patients: PinkCheek.be Fact Sheet for Healthcare Providers: GravelBags.it This test is not yet approved or cleared by the [place] FDA and  has been authorized for detection and/or diagnosis of SARS-CoV-2 by  FDA under an Emergency Use Authorization (EUA). This  EUA will remain  in effect (meaning this test can be used) for the duration of the  Covid-19 declaration under Section 564(b)(1) of the Act, 21  U.S.C. section 360bbb-3(b)(1), unless the authorization is  terminated or revoked. Performed at [facility], [place] [address] [place], [place] [address]   C Difficile Quick Screen w PCR reflex     Status: Abnormal   Collection Time: 09/29/19  4:03 AM   Specimen: Stool  Result Value Ref Range Status   C Diff antigen POSITIVE (A) NEGATIVE Final   C Diff toxin NEGATIVE NEGATIVE Final   C Diff interpretation Results are indeterminate. See PCR results.  Final    Comment: Performed at [facility], [place] [address], [place],  [place] [address]  C. Diff by PCR, Reflexed     Status: Abnormal   Collection Time: 09/29/19  4:03 AM  Result Value Ref Range Status   Toxigenic C. Difficile by PCR POSITIVE (A) NEGATIVE Final    Comment: Positive for toxigenic C. difficile with little to no toxin production. Only treat if clinical presentation suggests symptomatic illness. Performed at [facility], [place] [address], [place], [place] [address]   Surgical pcr screen     Status: None   Collection Time: 09/29/19  9:22 AM   Specimen: Nasal Mucosa; Nasal Swab  Result Value Ref Range Status   MRSA, PCR NEGATIVE NEGATIVE Final   Staphylococcus aureus NEGATIVE NEGATIVE Final    Comment: (NOTE) The Xpert SA Assay (FDA approved for NASAL specimens in patients 29 years of age and older), is one component of a comprehensive surveillance program. It is not intended to diagnose infection nor to guide or monitor treatment. Performed at [facility], [place] [address], [place], [place] [address]      Labs: BNP (last 3 results) No results for input(s): BNP in the last 8760 hours. Basic Metabolic Panel: Recent Labs  Lab 09/28/19 1351 09/30/19 0517 10/01/19 0433  NA 136 140 140  K 3.7 3.6 3.4*  CL 101 107 104  CO2 25 27 29   GLUCOSE 122* 117* 100*  BUN 10 <5* <5*  CREATININE 0.84 0.67 0.62  CALCIUM 10.0 8.7* 8.9   Liver Function Tests: Recent Labs  Lab 09/28/19 1351  AST 19  ALT 14  ALKPHOS 94  BILITOT 0.5  PROT 6.8  ALBUMIN 3.9   Recent Labs  Lab 09/28/19 1351  LIPASE 21   No results for input(s): AMMONIA in the last 168 hours. CBC: Recent Labs  Lab 09/28/19 1351 09/29/19 0046 09/30/19 0517 10/01/19 0433  WBC 7.7 9.0 6.9 7.0  NEUTROABS  --   --  2.8 2.4  HGB 13.2 11.7* 10.2* 10.7*  HCT 39.2 34.9* 31.7* 32.8*  MCV 96.1 95.1 98.4 98.5  PLT 240 204 178 194   Cardiac Enzymes: No results for input(s): CKTOTAL, CKMB, CKMBINDEX, TROPONINI in the last 168 hours. BNP: Invalid input(s):  POCBNP CBG: No results for input(s): GLUCAP in the last 168 hours. D-Dimer No results for input(s): DDIMER in the last 72 hours. Hgb A1c No results for input(s): HGBA1C in the last 72 hours. Lipid Profile No results for input(s): CHOL, HDL, LDLCALC, TRIG, CHOLHDL, LDLDIRECT in the last 72 hours. Thyroid function studies No results for input(s): TSH, T4TOTAL, T3FREE, THYROIDAB in the last 72 hours.  Invalid input(s): FREET3 Anemia work up No results for input(s): VITAMINB12, FOLATE, FERRITIN, TIBC, IRON, RETICCTPCT in the last 72 hours. Urinalysis  Component Value Date/Time   COLORURINE YELLOW 09/28/2019 1940   APPEARANCEUR CLEAR 09/28/2019 1940   LABSPEC 1.014 09/28/2019 1940   PHURINE 6.0 09/28/2019 1940   GLUCOSEU NEGATIVE 09/28/2019 1940   HGBUR NEGATIVE 09/28/2019 1940   BILIRUBINUR NEGATIVE 09/28/2019 1940   KETONESUR NEGATIVE 09/28/2019 1940   PROTEINUR NEGATIVE 09/28/2019 1940   UROBILINOGEN 0.2 08/12/2012 1451   NITRITE NEGATIVE 09/28/2019 1940   LEUKOCYTESUR NEGATIVE 09/28/2019 1940   Sepsis Labs Invalid input(s): PROCALCITONIN,  WBC,  LACTICIDVEN Microbiology Recent Results (from the past 240 hour(s))  Respiratory Panel by RT PCR (Flu A&B, Covid) - Nasopharyngeal Swab     Status: None   Collection Time: 09/28/19  8:55 PM   Specimen: Nasopharyngeal Swab  Result Value Ref Range Status   SARS Coronavirus 2 by RT PCR NEGATIVE NEGATIVE Final    Comment: (NOTE) SARS-CoV-2 target nucleic acids are NOT DETECTED. The SARS-CoV-2 RNA is generally detectable in upper respiratoy specimens during the acute phase of infection. The lowest concentration of SARS-CoV-2 viral copies this assay can detect is 131 copies/mL. A negative result does not preclude SARS-Cov-2 infection and should not be used as the sole basis for treatment or other patient management decisions. A negative result may occur with  improper specimen collection/handling, submission of specimen other than  nasopharyngeal swab, presence of viral mutation(s) within the areas targeted by this assay, and inadequate number of viral copies (<131 copies/mL). A negative result must be combined with clinical observations, patient history, and epidemiological information. The expected result is Negative. Fact Sheet for Patients:  PinkCheek.be Fact Sheet for Healthcare Providers:  GravelBags.it This test is not yet ap proved or cleared by the [place] FDA and  has been authorized for detection and/or diagnosis of SARS-CoV-2 by FDA under an Emergency Use Authorization (EUA). This EUA will remain  in effect (meaning this test can be used) for the duration of the COVID-19 declaration under Section 564(b)(1) of the Act, 21 U.S.C. section 360bbb-3(b)(1), unless the authorization is terminated or revoked sooner.    Influenza A by PCR NEGATIVE NEGATIVE Final   Influenza B by PCR NEGATIVE NEGATIVE Final    Comment: (NOTE) The Xpert Xpress SARS-CoV-2/FLU/RSV assay is intended as an aid in  the diagnosis of influenza from Nasopharyngeal swab specimens and  should not be used as a sole basis for treatment. Nasal washings and  aspirates are unacceptable for Xpert Xpress SARS-CoV-2/FLU/RSV  testing. Fact Sheet for Patients: PinkCheek.be Fact Sheet for Healthcare Providers: GravelBags.it This test is not yet approved or cleared by the [place] FDA and  has been authorized for detection and/or diagnosis of SARS-CoV-2 by  FDA under an Emergency Use Authorization (EUA). This EUA will remain  in effect (meaning this test can be used) for the duration of the  Covid-19 declaration under Section 564(b)(1) of the Act, 21  U.S.C. section 360bbb-3(b)(1), unless the authorization is  terminated or revoked. Performed at [facility], [place] [address], [place], [place] [address]    C Difficile Quick Screen w PCR reflex     Status: Abnormal   Collection Time: 09/29/19  4:03 AM   Specimen: Stool  Result Value Ref Range Status   C Diff antigen POSITIVE (A) NEGATIVE Final   C Diff toxin NEGATIVE NEGATIVE Final   C Diff interpretation Results are indeterminate. See PCR results.  Final    Comment: Performed at [facility], [place] [address], [place], [place] [address]  C. Diff by PCR,  Reflexed     Status: Abnormal   Collection Time: 09/29/19  4:03 AM  Result Value Ref Range Status   Toxigenic C. Difficile by PCR POSITIVE (A) NEGATIVE Final    Comment: Positive for toxigenic C. difficile with little to no toxin production. Only treat if clinical presentation suggests symptomatic illness. Performed at [facility], [place] [address], [place], [place] [address]   Surgical pcr screen     Status: None   Collection Time: 09/29/19  9:22 AM   Specimen: Nasal Mucosa; Nasal Swab  Result Value Ref Range Status   MRSA, PCR NEGATIVE NEGATIVE Final   Staphylococcus aureus NEGATIVE NEGATIVE Final    Comment: (NOTE) The Xpert SA Assay (FDA approved for NASAL specimens in patients 75 years of age and older), is one component of a comprehensive surveillance program. It is not intended to diagnose infection nor to guide or monitor treatment. Performed at [facility], [place] [address], [place],  [address]      Time coordinating discharge: 45 minutes  SIGNED:   Tawni Millers, MD  [facility] 10/01/2019, 12:00 PM

## 2019-10-01 NOTE — Plan of Care (Signed)
Adequate for discharge.

## 2019-10-02 ENCOUNTER — Encounter: Payer: Self-pay | Admitting: *Deleted

## 2019-10-02 ENCOUNTER — Other Ambulatory Visit: Payer: Self-pay

## 2019-10-02 LAB — SURGICAL PATHOLOGY

## 2019-10-05 ENCOUNTER — Other Ambulatory Visit: Payer: Self-pay

## 2019-10-05 ENCOUNTER — Ambulatory Visit (INDEPENDENT_AMBULATORY_CARE_PROVIDER_SITE_OTHER): Payer: Medicare Other | Admitting: Family Medicine

## 2019-10-05 VITALS — BP 144/100 | HR 80 | Temp 97.4°F | Resp 12 | Ht 64.0 in | Wt 200.0 lb

## 2019-10-05 DIAGNOSIS — A0472 Enterocolitis due to Clostridium difficile, not specified as recurrent: Secondary | ICD-10-CM | POA: Diagnosis not present

## 2019-10-05 MED ORDER — TIZANIDINE HCL 4 MG PO TABS
4.0000 mg | ORAL_TABLET | Freq: Four times a day (QID) | ORAL | 0 refills | Status: DC | PRN
Start: 2019-10-05 — End: 2019-12-12

## 2019-10-05 NOTE — Progress Notes (Signed)
Subjective:    Patient ID: Janet Peterson, female    DOB: 06/22/1950, 69 y.o.   MRN: [id number]  HPI Recently admitted to the hospital:  Admit date: 09/28/2019 Discharge date: 10/01/2019  Admitted From: Home  Disposition:  Home   Recommendations for Outpatient Follow-up and new medication changes:  1. Follow up with Dr. Dennard Schaumann in 7 days.  2. Continue with oral vancomycin for 10 more days. 3. Omeprazole changed to famotidine (to prevent c diff)  Home Health: no   Equipment/Devices: no    Discharge Condition: stable  CODE STATUS: full  Diet recommendation: heart healthy   Brief/Interim Summary: Patient was admitted to the hospital with the working diagnosis ofC diffcolitis/ diarrhea.  69 year old female who presented with abdominal pain and rectal bleeding. She does have the significant past medical history for psoriatic arthritis, osteoarthrosis, fibromyalgia, degenerative disc disease, GERD, anxiety, depression and obstructive sleep apnea. Patient complainedbloody diarrhea x2 over the last 24 hours prior to hospitalization. She has been experiencing dyspepsia andher antiacid therapy was increased to twice daily (Prilosec) by her gastroenterologist within the last month. On her initial physical examination, blood pressure 150/80, heart rate 84, respiratory rate 13, oxygen saturation 95%.Her lungs were clear to auscultation bilaterally, heart S1-S2 present and rhythmic, her abdomen had generalized tenderness, positive guarding, no lower extremity edema. Sodium 136, potassium 3.7, chloride 101, bicarb 25, glucose 122, BUN 10, creatinine 0.84, white count 7.7, hemoglobin 13.2, hematocrit 39.2, platelets 240.Abdomen/pelvis CT scan with changes consistent with colitis involving the descending and sigmoid colon. SARS COVID 19 negative.  EKG 81 bpm, normal axis, normal intervals, sinus rhythm, no ST segment changes, negative T wave V2 through V6.  Patient was placed intialy  on IV antibiotic therapy, IV fluids, as needed antiemetics and analgesics. C diff antigen positive, C diff toxin negative, PCR positive. IV antibiotic therapy was discontinued and patient was placed on oral vancomycin.   Patient underwent colonoscopy finding: congested, erythematous, friable, inflamed, ulcerated and thickened folds of the mucosa in the sigmoid and descending colon. Upper endoscopy with erythematous mucosa in the antrum.  Patient's symptoms continue to improve and by 05/09 her diarrhea had resolved.   1.  Acute C. difficile colitis/diarrhea.  Patient was admitted to the medical ward, she received intravenous fluids and initialy IV antibiotic therapy.  Once the diagnosis of C. difficile was confirmed IV antibiotic therapy was discontinued and she received monotherapy with oral vancomycin.  Currently her pathology report is still pending but clinical picture consistent with C. difficile colitis.  Her discharge white cell count is 7.0, patient will continue taking oral vancomycin for the next 10 days.  2.  Acute migraine headache.  Patient received 1 dose of sumatriptan with improvement of her symptoms.  3.  GERD.  Proton pump inhibitor has been exchanged to H2 blocker to prevent further C. difficile infections.  4.  Hypertension/dyslipidemia.  Patient will continue valsartan and metoprolol for blood pressure control, continue ezetimibe.  5.  Psoriatic arthritis.  No signs of acute flare, continue Simponi Aria infusions as an outpatient. Continue as needed naproxen and oxycodone.   6.  Depression.  Continue bupropion, clonazepam and fluvoxamine.   I reviewed her medical records from the hospital.  She has about 8 more days of vancomycin left to take.  This is per her report.  She states that the diarrhea has resolved.  She denies any abdominal pain.  She denies any nausea or vomiting.  Overall she states that she feels well.  She denies any fever chills.  She is not  certain of where she got exposed to C. difficile however she is in numerous medical offices and even the hospital due to her medical problems.  She did have a migraine while she was in the hospital that responded to Imitrex.  She is interested in potentially using that again.  I am concerned about using Imitrex given her age and her blood pressure.  Instead I would like to try Ubrelvy as I believe this would be safer.  Her blood pressure here today is elevated at 144/100 however she is also having orthostatic spells at home.  At the present time she denies any chest pain shortness of breath or dyspnea on exertion. Past Medical History:  Diagnosis Date  . Ankylosing spondylitis ([place])   . Anxiety and depression   . Depression   . Fibromyalgia   . GERD (gastroesophageal reflux disease)   . History of basal cell carcinoma excision    FACE, [place]  . History of hiatal hernia   . History of thrush   . Hyperlipidemia   . Hypertension   . Insomnia   . Left breast mass   . OCD (obsessive compulsive disorder)   . OSA (obstructive sleep apnea)    MILD AND NO CPAP SINCE SURGERY IN 2007  . Osteopenia   . PONV (postoperative nausea and vomiting)   . Psoriatic arthritis ([place])   . PVC (premature ventricular contraction)   . Rheumatoid arthritis [facility])    Past Surgical History:  Procedure Laterality Date  . BIOPSY  09/29/2019   Procedure: BIOPSY;  Surgeon: Ronnette Juniper, MD;  [facility];  Service: Gastroenterology;;  . BREAST LUMPECTOMY WITH RADIOACTIVE SEED LOCALIZATION Left 05/04/2018   Procedure: LEFT BREAST LUMPECTOMY WITH RADIOACTIVE SEED LOCALIZATION AND LEFT BREAST NIPPLE BIOPSY;  Surgeon: Jovita Kussmaul, MD;  Location: [place];  Service: General;  Laterality: Left;  . BREAST SURGERY  1975   lumpectomy-- benign  . BUNIONECTOMY  1991  . CATARACT EXTRACTION W/ INTRAOCULAR LENS  IMPLANT, BILATERAL  2000  . CERVICAL FUSION  March 2013   C5 -- C7  . CESAREAN  SECTION  1985  . COLONOSCOPY WITH PROPOFOL N/A 09/29/2019   Procedure: COLONOSCOPY WITH PROPOFOL;  Surgeon: Ronnette Juniper, MD;  [facility];  Service: Gastroenterology;  Laterality: N/A;  . DISTAL INTERPHALANGEAL JOINT FUSION Right 03/14/2015   Procedure: RIGHT LONG FINGER DISTAL INTERPHALANGEAL JOINT ARTHRODESIS;  Surgeon: Iran Planas, MD;  Location: [place];  Service: Orthopedics;  Laterality: Right;  . ESOPHAGOGASTRODUODENOSCOPY (EGD) WITH PROPOFOL N/A 09/29/2019   Procedure: ESOPHAGOGASTRODUODENOSCOPY (EGD) WITH PROPOFOL;  Surgeon: Ronnette Juniper, MD;  [facility];  Service: Gastroenterology;  Laterality: N/A;  . EYE SURGERY  1995   rk (laser surgery), semi cornea transplant, detacted retina,  fluid removal  . KNEE ARTHROSCOPY Left 03-03-2004  . POLYPECTOMY  09/29/2019   Procedure: POLYPECTOMY;  Surgeon: Ronnette Juniper, MD;  [facility];  Service: Gastroenterology;;  . POSTERIOR VITRECTOMY RIGHT EYE AND LASER   10-27-1999  . SHOULDER SURGERY Right 1997  . SPINAL FIXATION SURGERY W/ IMPLANT  2013 rod #1//  2014  rod 2   S1 -- T10  (rod #1)//   S1 -- T4 (rod #2)  . THORACIC FUSION  03-13-2013   REMOVAL HARDWARE/  BONE GRAFT FUSION T10//  REVISION OF RODS  . TOTAL KNEE ARTHROPLASTY Left 12-14-2005  . UVULOPALATOPHARYNGOPLASTY  04-26-2006  w/  TONSILLECTOMY/  TURBINATE REDUCTIONS/  BILATERAL ANTERIOR ETHMOIDECTOMY   Current Outpatient Medications on File Prior to Visit  Medication Sig Dispense Refill  . buPROPion (WELLBUTRIN XL) 300 MG 24 hr tablet Take 300 mg by mouth every morning.  1  . clonazePAM (KLONOPIN) 1 MG tablet TAKE 1 TABLET BY MOUTH EVERY DAY AS NEEDED FOR ANXIETY 30 tablet 1  . ezetimibe (ZETIA) 10 MG tablet TAKE 1 TABLET BY MOUTH EVERY DAY (Patient taking differently: Take 10 mg by mouth daily after breakfast. ) 90 tablet 3  . fluticasone (FLONASE) 50 MCG/ACT nasal spray Place 2 sprays into both nostrils daily as needed for allergies.      . fluvoxaMINE (LUVOX) 100 MG tablet Take 100 mg by mouth 2 (two) times daily.    . Golimumab ([place] ARIA IV) Inject into the vein every 8 (eight) weeks.    . metoprolol succinate (TOPROL-XL) 50 MG 24 hr tablet TAKE 1 TABLET BY MOUTH ONCE DAILY FOLLOWING A MEAL (Patient taking differently: Take 50 mg by mouth daily after breakfast. ) 90 tablet 3  . naproxen sodium (ALEVE) 220 MG tablet Take 440 mg by mouth 2 (two) times daily as needed (pain).    Marland Kitchen omeprazole (PRILOSEC) 20 MG capsule Take 20 mg by mouth daily.    Marland Kitchen oxyCODONE-acetaminophen (PERCOCET) 5-325 MG tablet Take 1 tablet by mouth every 4 (four) hours as needed. (Patient taking differently: Take 1 tablet by mouth at bedtime. ) 60 tablet 0  . Probiotic Product (PROBIOTIC PO) Take 1 tablet by mouth daily after breakfast.    . valsartan (DIOVAN) 320 MG tablet TAKE 1 TABLET BY MOUTH EVERY DAY 90 tablet 3  . vancomycin (VANCOCIN) 50 mg/mL oral solution Take 2.5 mLs (125 mg total) by mouth 4 (four) times daily for 10 days. 100 mL 0   No current facility-administered medications on file prior to visit.   Allergies  Allergen Reactions  . Latex Rash    And Mouth sores  . Amlodipine Besy-Benazepril Hcl Other (See Comments)    COUGH  . Augmentin [Amoxicillin-Pot Clavulanate] Diarrhea and Nausea And Vomiting  . Bextra [Valdecoxib] Diarrhea and Nausea And Vomiting    REFLUX  . Chlorhexidine     rash  . Lipitor [Atorvastatin Calcium] Other (See Comments)    MYALGIAS  . Sulfa Antibiotics Nausea And Vomiting    CRAMPING  . Zocor [Simvastatin] Other (See Comments)    MYALGIA   Social History   Socioeconomic History  . Marital status: Married    Spouse name: Not on file  . Number of children: Not on file  . Years of education: Not on file  . Highest education level: Not on file  Occupational History  . Not on file  Tobacco Use  . Smoking status: Former Smoker    Packs/day: 0.50    Years: 10.00    Pack years: 5.00    Types:  Cigarettes    Quit date: 03/07/1995    Years since quitting: 24.5  . Smokeless tobacco: Never Used  Substance and Sexual Activity  . Alcohol use: Yes    Comment: rare  . Drug use: Never  . Sexual activity: Not on file  Other Topics Concern  . Not on file  Social History Narrative  . Not on file   Social Determinants of Health   Financial Resource Strain:   . Difficulty of Paying Living Expenses:   Food Insecurity:   . Worried About Charity fundraiser in the  Last Year:   . [place] in the Last Year:   Transportation Needs:   . Film/video editor (Medical):   Marland Kitchen Lack of Transportation (Non-Medical):   Physical Activity:   . Days of Exercise per Week:   . Minutes of Exercise per Session:   Stress:   . Feeling of Stress :   Social Connections:   . Frequency of Communication with Friends and Family:   . Frequency of Social Gatherings with Friends and Family:   . Attends Religious Services:   . Active Member of Clubs or Organizations:   . Attends Archivist Meetings:   Marland Kitchen Marital Status:   Intimate Partner Violence:   . Fear of Current or Ex-Partner:   . Emotionally Abused:   Marland Kitchen Physically Abused:   . Sexually Abused:     Review of Systems  All other systems reviewed and are negative.      Objective:   Physical Exam Vitals reviewed.  Constitutional:      General: She is not in acute distress.    Appearance: Normal appearance. She is normal weight. She is not ill-appearing or toxic-appearing.  Cardiovascular:     Rate and Rhythm: Normal rate and regular rhythm.     Heart sounds: Normal heart sounds. No murmur. No friction rub. No gallop.   Pulmonary:     Effort: Pulmonary effort is normal. No respiratory distress.     Breath sounds: Normal breath sounds. No wheezing, rhonchi or rales.  Abdominal:     General: Abdomen is flat. Bowel sounds are normal.     Palpations: Abdomen is soft.  Musculoskeletal:     Right lower leg: No edema.     Left  lower leg: No edema.  Neurological:     Mental Status: She is alert.           Assessment & Plan:  C. difficile colitis  Clinically, patient is asymptomatic from her C. difficile colitis.  However my concern is that she may have a recurrent episode when she discontinues vancomycin.  Therefore we will monitor the patient closely after she discontinues the medication.  I have also recommended that she take a probiotic every day and wash her hands thoroughly frequently throughout the day to avoid potential reexposure.  Her blood pressure here today is elevated.  I asked the patient to check her blood pressure twice a day for the next week and record values.  If consistently greater than 140/90, we may need to resume amlodipine but at the present time we will make no changes and wait to see what her average blood pressure is.  I have recommended against using Imitrex due to her age and high blood pressure due to the increased risk of stroke.  Instead I would like to try Ubrelvy.  I did give the patient a sample of a 50 mg tablet to take at the first sign of a migraine and she can try and see how it works.  I did refill Zanaflex which she uses occasionally for muscle pain.  Recheck in 2 weeks or sooner if worsening.

## 2019-10-28 ENCOUNTER — Encounter (HOSPITAL_COMMUNITY): Payer: Self-pay | Admitting: Emergency Medicine

## 2019-10-28 ENCOUNTER — Other Ambulatory Visit: Payer: Self-pay

## 2019-10-28 ENCOUNTER — Emergency Department (HOSPITAL_COMMUNITY)
Admission: EM | Admit: 2019-10-28 | Discharge: 2019-10-29 | Disposition: A | Discharge status: Other Acute Inpatient Hospital | Payer: Medicare Other | Attending: Emergency Medicine | Admitting: Emergency Medicine

## 2019-10-28 DIAGNOSIS — F329 Major depressive disorder, single episode, unspecified: Secondary | ICD-10-CM | POA: Diagnosis present

## 2019-10-28 DIAGNOSIS — Z91040 Latex allergy status: Secondary | ICD-10-CM | POA: Diagnosis not present

## 2019-10-28 DIAGNOSIS — Z882 Allergy status to sulfonamides status: Secondary | ICD-10-CM | POA: Insufficient documentation

## 2019-10-28 DIAGNOSIS — Z20822 Contact with and (suspected) exposure to covid-19: Secondary | ICD-10-CM | POA: Insufficient documentation

## 2019-10-28 DIAGNOSIS — Z880 Allergy status to penicillin: Secondary | ICD-10-CM | POA: Diagnosis not present

## 2019-10-28 DIAGNOSIS — Z751 Person awaiting admission to adequate facility elsewhere: Secondary | ICD-10-CM

## 2019-10-28 DIAGNOSIS — F410 Panic disorder [episodic paroxysmal anxiety] without agoraphobia: Secondary | ICD-10-CM | POA: Insufficient documentation

## 2019-10-28 DIAGNOSIS — F32A Depression, unspecified: Secondary | ICD-10-CM

## 2019-10-28 DIAGNOSIS — F419 Anxiety disorder, unspecified: Secondary | ICD-10-CM | POA: Diagnosis not present

## 2019-10-28 DIAGNOSIS — F332 Major depressive disorder, recurrent severe without psychotic features: Secondary | ICD-10-CM | POA: Diagnosis not present

## 2019-10-28 DIAGNOSIS — R45851 Suicidal ideations: Secondary | ICD-10-CM | POA: Diagnosis not present

## 2019-10-28 LAB — COMPREHENSIVE METABOLIC PANEL
ALT: 13 U/L (ref 0–44)
AST: 16 U/L (ref 15–41)
Albumin: 3.8 g/dL (ref 3.5–5.0)
Alkaline Phosphatase: 118 U/L (ref 38–126)
Anion gap: 12 (ref 5–15)
BUN: 11 mg/dL (ref 8–23)
CO2: 27 mmol/L (ref 22–32)
Calcium: 10.1 mg/dL (ref 8.9–10.3)
Chloride: 99 mmol/L (ref 98–111)
Creatinine, Ser: 0.92 mg/dL (ref 0.44–1.00)
GFR calc Af Amer: >60 mL/min (ref >60)
GFR calc non Af Amer: >60 mL/min (ref >60)
Glucose, Bld: 146 mg/dL — ABNORMAL HIGH (ref 70–99)
Potassium: 3.5 mmol/L (ref 3.5–5.1)
Sodium: 138 mmol/L (ref 135–145)
Total Bilirubin: 0.2 mg/dL — ABNORMAL LOW (ref 0.3–1.2)
Total Protein: 7 g/dL (ref 6.5–8.1)

## 2019-10-28 LAB — CBC WITH DIFFERENTIAL/PLATELET
Abs Immature Granulocytes: 0.03 10*3/uL (ref 0.00–0.07)
Basophils Absolute: 0.1 10*3/uL (ref 0.0–0.1)
Basophils Relative: 1 %
Eosinophils Absolute: 0.2 10*3/uL (ref 0.0–0.5)
Eosinophils Relative: 2 %
HCT: 40.7 % (ref 36.0–46.0)
Hemoglobin: 13.3 g/dL (ref 12.0–15.0)
Immature Granulocytes: 0 %
Lymphocytes Relative: 31 %
Lymphs Abs: 2.4 10*3/uL (ref 0.7–4.0)
MCH: 32 pg (ref 26.0–34.0)
MCHC: 32.7 g/dL (ref 30.0–36.0)
MCV: 98.1 fL (ref 80.0–100.0)
Monocytes Absolute: 0.5 10*3/uL (ref 0.1–1.0)
Monocytes Relative: 7 %
Neutro Abs: 4.4 10*3/uL (ref 1.7–7.7)
Neutrophils Relative %: 59 %
Platelets: 257 10*3/uL (ref 150–400)
RBC: 4.15 MIL/uL (ref 3.87–5.11)
RDW: 13.7 % (ref 11.5–15.5)
WBC: 7.5 10*3/uL (ref 4.0–10.5)
nRBC: 0 % (ref 0.0–0.2)

## 2019-10-28 LAB — ETHANOL: Alcohol, Ethyl (B): <10 mg/dL (ref <10)

## 2019-10-28 NOTE — ED Provider Notes (Signed)
[facility] DEPT Provider Note   CSN: [id number] Arrival date & time: 10/28/19  1510     History Chief Complaint  Patient presents with  . Depression    Janet Peterson is a 69 y.o. female presenting to the emergency department with chief complaint of depression.  Patient states over the last 2 months her moods have been worsening with worsening depression.  She has been experiencing a increased symptoms of her OCD where she is obsessed with cleanliness and will constantly reevaluate situations over and over again causing her distress.  She endorses suicidal thoughts occasionally with plan to use her firearm at home or overdose on medications.  She is treated by her PCP with medications for depression.  She states she switched to Luvox about 2 months ago and after this which she feels like depression is worsening and not well controlled.  No medical complaints.  The history is provided by the patient.       Past Medical History:  Diagnosis Date  . Ankylosing spondylitis ([place])   . Anxiety and depression   . Depression   . Fibromyalgia   . GERD (gastroesophageal reflux disease)   . History of basal cell carcinoma excision    FACE, [place]  . History of hiatal hernia   . History of thrush   . Hyperlipidemia   . Hypertension   . Insomnia   . Left breast mass   . OCD (obsessive compulsive disorder)   . OSA (obstructive sleep apnea)    MILD AND NO CPAP SINCE SURGERY IN 2007  . Osteopenia   . PONV (postoperative nausea and vomiting)   . Psoriatic arthritis ([place])   . PVC (premature ventricular contraction)   . Rheumatoid arthritis [facility])     Patient Active Problem List   Diagnosis Date Noted  . C. difficile colitis 09/29/2019  . Colitis 09/28/2019  . Anxiety and depression 09/28/2019  . DDD (degenerative disc disease), cervical s/p fusion 11/12/2016  . H/O total knee replacement, left 05/06/2016  . DDD lumbar spine status post fusion  05/06/2016  . Psoriasis 05/05/2016  . High risk medication use 05/05/2016  . Cervical post-laminectomy syndrome 12/09/2015  . Thoracic postlaminectomy syndrome 12/09/2015  . Psoriatic arthritis ([place]) 08/23/2012  . Osteopenia 08/23/2012  . HLD (hyperlipidemia) 08/23/2012  . RLS (restless legs syndrome) 08/23/2012  . Insomnia 08/23/2012  . Fibromyalgia syndrome 08/12/2012  . Low back pain 08/12/2012  . Syncope   . PVC (premature ventricular contraction)   . OCD (obsessive compulsive disorder)   . GERD (gastroesophageal reflux disease)   . Hypertension     Past Surgical History:  Procedure Laterality Date  . BIOPSY  09/29/2019   Procedure: BIOPSY;  Surgeon: Ronnette Juniper, MD;  [facility];  Service: Gastroenterology;;  . BREAST LUMPECTOMY WITH RADIOACTIVE SEED LOCALIZATION Left 05/04/2018   Procedure: LEFT BREAST LUMPECTOMY WITH RADIOACTIVE SEED LOCALIZATION AND LEFT BREAST NIPPLE BIOPSY;  Surgeon: Jovita Kussmaul, MD;  Location: [place];  Service: General;  Laterality: Left;  . BREAST SURGERY  1975   lumpectomy-- benign  . BUNIONECTOMY  1991  . CATARACT EXTRACTION W/ INTRAOCULAR LENS  IMPLANT, BILATERAL  2000  . CERVICAL FUSION  March 2013   C5 -- C7  . CESAREAN SECTION  1985  . COLONOSCOPY WITH PROPOFOL N/A 09/29/2019   Procedure: COLONOSCOPY WITH PROPOFOL;  Surgeon: Ronnette Juniper, MD;  [facility];  Service: Gastroenterology;  Laterality: N/A;  .  DISTAL INTERPHALANGEAL JOINT FUSION Right 03/14/2015   Procedure: RIGHT LONG FINGER DISTAL INTERPHALANGEAL JOINT ARTHRODESIS;  Surgeon: Iran Planas, MD;  Location: [place];  Service: Orthopedics;  Laterality: Right;  . ESOPHAGOGASTRODUODENOSCOPY (EGD) WITH PROPOFOL N/A 09/29/2019   Procedure: ESOPHAGOGASTRODUODENOSCOPY (EGD) WITH PROPOFOL;  Surgeon: Ronnette Juniper, MD;  [facility];  Service: Gastroenterology;  Laterality: N/A;  . EYE SURGERY  1995   rk (laser surgery), semi  cornea transplant, detacted retina,  fluid removal  . KNEE ARTHROSCOPY Left 03-03-2004  . POLYPECTOMY  09/29/2019   Procedure: POLYPECTOMY;  Surgeon: Ronnette Juniper, MD;  [facility];  Service: Gastroenterology;;  . POSTERIOR VITRECTOMY RIGHT EYE AND LASER   10-27-1999  . SHOULDER SURGERY Right 1997  . SPINAL FIXATION SURGERY W/ IMPLANT  2013 rod #1//  2014  rod 2   S1 -- T10  (rod #1)//   S1 -- T4 (rod #2)  . THORACIC FUSION  03-13-2013   REMOVAL HARDWARE/  BONE GRAFT FUSION T10//  REVISION OF RODS  . TOTAL KNEE ARTHROPLASTY Left 12-14-2005  . UVULOPALATOPHARYNGOPLASTY  04-26-2006   w/  TONSILLECTOMY/  TURBINATE REDUCTIONS/  BILATERAL ANTERIOR ETHMOIDECTOMY     OB History   No obstetric history on file.     Family History  Problem Relation Age of Onset  . Diabetes Mother   . Heart disease Mother   . Diabetes Father   . Anuerysm Father   . Diabetes Brother   . Heart disease Brother   . Diabetes Brother   . Heart disease Brother     Social History   Tobacco Use  . Smoking status: Former Smoker    Packs/day: 0.50    Years: 10.00    Pack years: 5.00    Types: Cigarettes    Quit date: 03/07/1995    Years since quitting: 24.6  . Smokeless tobacco: Never Used  Substance Use Topics  . Alcohol use: Yes    Comment: rare  . Drug use: Never    Home Medications Prior to Admission medications   Medication Sig Start Date End Date Taking? Authorizing Provider  buPROPion (WELLBUTRIN XL) 300 MG 24 hr tablet Take 300 mg by mouth every morning. 04/07/17   [provider]  clonazePAM (KLONOPIN) 1 MG tablet TAKE 1 TABLET BY MOUTH EVERY DAY AS NEEDED FOR ANXIETY 07/07/19   Susy Frizzle, MD  ezetimibe (ZETIA) 10 MG tablet TAKE 1 TABLET BY MOUTH EVERY DAY Patient taking differently: Take 10 mg by mouth daily after breakfast.  07/17/19   Susy Frizzle, MD  fluticasone (FLONASE) 50 MCG/ACT nasal spray Place 2 sprays into both nostrils daily as needed for allergies.      [provider]  fluvoxaMINE (LUVOX) 100 MG tablet Take 100 mg by mouth 2 (two) times daily. 07/19/19   [provider]  Golimumab ([place] ARIA IV) Inject into the vein every 8 (eight) weeks.    [provider]  metoprolol succinate (TOPROL-XL) 50 MG 24 hr tablet TAKE 1 TABLET BY MOUTH ONCE DAILY FOLLOWING A MEAL Patient taking differently: Take 50 mg by mouth daily after breakfast.  11/30/18   Susy Frizzle, MD  naproxen sodium (ALEVE) 220 MG tablet Take 440 mg by mouth 2 (two) times daily as needed (pain).    [provider]  omeprazole (PRILOSEC) 20 MG capsule Take 20 mg by mouth daily.    [provider]  oxyCODONE-acetaminophen (PERCOCET) 5-325 MG tablet Take 1 tablet by mouth every  4 (four) hours as needed. Patient taking differently: Take 1 tablet by mouth at bedtime.  09/12/19   Susy Frizzle, MD  Probiotic Product (PROBIOTIC PO) Take 1 tablet by mouth daily after breakfast.    [provider]  tiZANidine (ZANAFLEX) 4 MG tablet Take 1 tablet (4 mg total) by mouth every 6 (six) hours as needed for muscle spasms. 10/05/19   Susy Frizzle, MD  valsartan (DIOVAN) 320 MG tablet TAKE 1 TABLET BY MOUTH EVERY DAY 05/15/19   Susy Frizzle, MD    Allergies    Latex, Amlodipine besy-benazepril hcl, Augmentin [amoxicillin-pot clavulanate], Bextra [valdecoxib], Chlorhexidine, Lipitor [atorvastatin calcium], Sulfa antibiotics, and Zocor [simvastatin]  Review of Systems   Review of Systems  All other systems reviewed and are negative.   Physical Exam Updated Vital Signs BP (!) 152/94 (BP Location: Right Arm)   Pulse 86   Temp 98.6 F (37 C) (Oral)   Resp 18   SpO2 99%   Physical Exam Vitals and nursing note reviewed.  Constitutional:      Appearance: She is well-developed.  HENT:     Head: Normocephalic and atraumatic.  Eyes:     Conjunctiva/sclera: Conjunctivae normal.  Cardiovascular:     Rate and Rhythm: Normal  rate and regular rhythm.  Pulmonary:     Effort: Pulmonary effort is normal. No respiratory distress.     Breath sounds: Normal breath sounds.  Abdominal:     General: Bowel sounds are normal.     Palpations: Abdomen is soft.  Skin:    General: Skin is warm.  Neurological:     Mental Status: She is alert.  Psychiatric:        Attention and Perception: Attention normal.        Mood and Affect: Mood is depressed. Affect is tearful.        Speech: Speech normal.        Behavior: Behavior normal. Behavior is cooperative.        Thought Content: Thought content includes suicidal ideation. Thought content includes suicidal plan.     ED Results / Procedures / Treatments   Labs (all labs ordered are listed, but only abnormal results are displayed) Labs Reviewed  COMPREHENSIVE METABOLIC PANEL - Abnormal; Notable for the following components:      Result Value   Glucose, Bld 146 (*)    Total Bilirubin 0.2 (*)    All other components within normal limits  SARS CORONAVIRUS 2 BY RT PCR (HOSPITAL ORDER, [facility])  ETHANOL  CBC WITH DIFFERENTIAL/PLATELET  RAPID URINE DRUG SCREEN, HOSP PERFORMED    EKG None  Radiology No results found.  Procedures Procedures (including critical care time)  Medications Ordered in ED Medications - No data to display  ED Course  I have reviewed the triage vital signs and the nursing notes.  Pertinent labs & imaging results that were available during my care of the patient were reviewed by me and considered in my medical decision making (see chart for details).  Clinical Course as of Oct 28 1918  Sat Oct 28, 2019  1919 Psych recommending inpatient therapy. Agree with plan.   [JR]    Clinical Course User Index [JR] Adham Johnson, [place] N, PA-C   MDM Rules/Calculators/A&P                      Patient presenting with depression and worsening symptoms of OCD over the last 2 months.  Her husband is accompanying her.   Patient endorses intermittent suicidal ideations with thoughts to use her firearm at home or overdose on her medications.  She has been unable to be evaluated by her PCP until July presents today for assistance.  She is tearful on evaluation though is calm and cooperative.  No medical complaints.  Screening labs ordered.  Will consult TTS for psychiatric evaluation.  Psych recommending inpt therapy.  Final Clinical Impression(s) / ED Diagnoses Final diagnoses:  Depression, unspecified depression type    Rx / DC Orders ED Discharge Orders    None       Daniela Hernan, [place] N, PA-C 10/28/19 1920    Hayden Rasmussen, MD 10/29/19 1001

## 2019-10-28 NOTE — BH Assessment (Signed)
[facility] Assessment Progress Note    Per Merlyn Lot, NP, patient meets inpatient admission criteria.  Patient does not necessarily need to be limited to a geriatric psych inpatient.

## 2019-10-28 NOTE — BH Assessment (Signed)
Tele Assessment Note   Patient Name: Janet Peterson MRN: [id number] Referring Physician: Aletta Edouard Location of Patient: [facility] Location of Provider: [place] Department  Paula Busenbark is an 69 y.o. female who presented to [facility] with her husband seeking help for her depression, suicidal thoughts and her anxiety.  Patient states that she has a long history of depression, but states that her depression has gotten worse over the past three years due to multiple deaths in her family.  Patient states that in the last three years that 2 brothers died, her sister, her sister-in-law and her son died from an overdose.  Patient states that these losses have been extremely hard for her and she ws very tearful as she talked about them. Patient states that she has been having suicidal thoughts to either shoot herself or overdose because she states that her medication is not working and she is starting to have panic attacks.  Patient states that she has never been inpatient before, but states that she has a therapist at [facility] counseling and she states that she sees Dr. Sharyn Dross for her medications.  Patient states that she is on Luvox, Wellbutrin and Clonazepam. Patient denies HI and psychosis, but states that sometimes she wakes from a dream concerning lost loved one and when she gets up that she looks for them because she feels like they are there in her home.  Patient states that her appetite and sleep have been good.  She denies any history of any drug or alcohol use. Patient states that she has never been abused and she states that she has never self-mutilated.  Patient states that she has been married for forty-seven years and she states that her husband is supportive.  She has one son living who was a twin to the son that she lost.  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  Patient presents as alert and oriented, her mood depressed and her  affect flat.  She is moderately anxious.  Her eye contact is good and her speech coherent.  She does not appear to be responding to any internal stimuli.  Her judgment, insight and impulse control are impaired.  Her thoughts are organized and her memory intact.  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 Ankylosing spondylitis ([place])   . Anxiety and depression   . Depression   . Fibromyalgia   . GERD (gastroesophageal reflux disease)   . History of basal cell carcinoma excision    FACE, [place]  . History of hiatal hernia   . History of thrush   . Hyperlipidemia   . Hypertension   . Insomnia   . Left breast mass   . OCD (obsessive compulsive disorder)   . OSA (obstructive sleep apnea)    MILD AND NO CPAP SINCE SURGERY IN 2007  . Osteopenia   . PONV (postoperative nausea and vomiting)   . Psoriatic arthritis ([place])   . PVC (premature ventricular contraction)   . Rheumatoid arthritis [facility])     Past Surgical History:  Procedure Laterality Date  . BIOPSY  09/29/2019   Procedure: BIOPSY;  Surgeon: Ronnette Juniper, MD;  [facility];  Service: Gastroenterology;;  . BREAST LUMPECTOMY WITH RADIOACTIVE SEED LOCALIZATION Left 05/04/2018   Procedure: LEFT BREAST LUMPECTOMY WITH RADIOACTIVE SEED LOCALIZATION AND LEFT BREAST NIPPLE BIOPSY;  Surgeon: Marlou Starks,  Sena Hitch, MD;  Location: [place];  Service: General;  Laterality: Left;  . BREAST SURGERY  1975   lumpectomy-- benign  . BUNIONECTOMY  1991  . CATARACT EXTRACTION W/ INTRAOCULAR LENS  IMPLANT, BILATERAL  2000  . CERVICAL FUSION  March 2013   C5 -- C7  . CESAREAN SECTION  1985  . COLONOSCOPY WITH PROPOFOL N/A 09/29/2019   Procedure: COLONOSCOPY WITH PROPOFOL;  Surgeon: Ronnette Juniper, MD;  [facility];  Service: Gastroenterology;  Laterality: N/A;  . DISTAL INTERPHALANGEAL JOINT FUSION Right 03/14/2015   Procedure: RIGHT LONG  FINGER DISTAL INTERPHALANGEAL JOINT ARTHRODESIS;  Surgeon: Iran Planas, MD;  Location: [place];  Service: Orthopedics;  Laterality: Right;  . ESOPHAGOGASTRODUODENOSCOPY (EGD) WITH PROPOFOL N/A 09/29/2019   Procedure: ESOPHAGOGASTRODUODENOSCOPY (EGD) WITH PROPOFOL;  Surgeon: Ronnette Juniper, MD;  [facility];  Service: Gastroenterology;  Laterality: N/A;  . EYE SURGERY  1995   rk (laser surgery), semi cornea transplant, detacted retina,  fluid removal  . KNEE ARTHROSCOPY Left 03-03-2004  . POLYPECTOMY  09/29/2019   Procedure: POLYPECTOMY;  Surgeon: Ronnette Juniper, MD;  [facility];  Service: Gastroenterology;;  . POSTERIOR VITRECTOMY RIGHT EYE AND LASER   10-27-1999  . SHOULDER SURGERY Right 1997  . SPINAL FIXATION SURGERY W/ IMPLANT  2013 rod #1//  2014  rod 2   S1 -- T10  (rod #1)//   S1 -- T4 (rod #2)  . THORACIC FUSION  03-13-2013   REMOVAL HARDWARE/  BONE GRAFT FUSION T10//  REVISION OF RODS  . TOTAL KNEE ARTHROPLASTY Left 12-14-2005  . UVULOPALATOPHARYNGOPLASTY  04-26-2006   w/  TONSILLECTOMY/  TURBINATE REDUCTIONS/  BILATERAL ANTERIOR ETHMOIDECTOMY    Family History:  Family History  Problem Relation Age of Onset  . Diabetes Mother   . Heart disease Mother   . Diabetes Father   . Anuerysm Father   . Diabetes Brother   . Heart disease Brother   . Diabetes Brother   . Heart disease Brother     Social History:  reports that she quit smoking about 24 years ago. Her smoking use included cigarettes. She has a 5.00 pack-year smoking history. She has never used smokeless tobacco. She reports current alcohol use. She reports that she does not use drugs.  Additional Social History:  Alcohol / Drug Use Pain Medications: see MAR Prescriptions: see MAR Over the Counter: see MAR History of alcohol / drug use?: No history of alcohol / drug abuse Longest period of sobriety (when/how long): N/A  CIWA: CIWA-Ar BP: (!) 152/94 Pulse Rate: 86 COWS:     Allergies:  Allergies  Allergen Reactions  . Latex Rash    And Mouth sores  . Amlodipine Besy-Benazepril Hcl Other (See Comments)    COUGH  . Augmentin [Amoxicillin-Pot Clavulanate] Diarrhea and Nausea And Vomiting  . Bextra [Valdecoxib] Diarrhea and Nausea And Vomiting    REFLUX  . Chlorhexidine     rash  . Lipitor [Atorvastatin Calcium] Other (See Comments)    MYALGIAS  . Sulfa Antibiotics Nausea And Vomiting    CRAMPING  . Zocor [Simvastatin] Other (See Comments)    MYALGIA    Home Medications: (Not in a hospital admission)   OB/GYN Status:  No LMP recorded. Patient is postmenopausal.  General Assessment Data [facility]: [facility] TTS Assessment: In system Is this a Tele or Face-to-Face Assessment?: Face-to-Face Is this an Initial Assessment or a Re-assessment for this encounter?: Initial Assessment Patient Accompanied by::  Other(husband) Language Other than English: No Living Arrangements: Other (Comment)(lives with husband) What gender do you identify as?: Female Marital status: Married [place] name: Lavone Neri Pregnancy Status: No Living Arrangements: Spouse/significant other Can pt return to current living arrangement?: Yes Admission Status: Voluntary Is patient capable of signing voluntary admission?: Yes Referral Source: Self/Family/Friend Insurance type: Medicare/AARP     Crisis Care Plan Living Arrangements: Spouse/significant other Legal Guardian: Other:(self) Name of Psychiatrist: [facility]/Dr. Anton [place] Name of Therapist: Chapman Moss  Education Status Is patient currently in school?: No Is the patient employed, unemployed or receiving disability?: Receiving disability income(retired)  Risk to self with the past 6 months Suicidal Ideation: Yes-Currently Present Has patient been a risk to self within the past 6 months prior to admission? : No Suicidal Intent: Yes-Currently Present Has patient had any suicidal intent within the  past 6 months prior to admission? : No Is patient at risk for suicide?: Yes Suicidal Plan?: Yes-Currently Present Has patient had any suicidal plan within the past 6 months prior to admission? : Yes Specify Current Suicidal Plan: OD/Shoot Self Access to Means: Yes Specify Access to Suicidal Means: Gun / Pills What has been your use of drugs/alcohol within the last 12 months?: none Previous Attempts/Gestures: No How many times?: 0 Other Self Harm Risks: grief issues Triggers for Past Attempts: None known Intentional Self Injurious Behavior: None Family Suicide History: No Recent stressful life event(s): Loss (Comment)(multiple family losses) Persecutory voices/beliefs?: No Depression: Yes Depression Symptoms: Despondent, Isolating, Loss of interest in usual pleasures, Feeling worthless/self pity Substance abuse history and/or treatment for substance abuse?: No Suicide prevention information given to non-admitted patients: Not applicable  Risk to Others within the past 6 months Homicidal Ideation: No Does patient have any lifetime risk of violence toward others beyond the six months prior to admission? : No Thoughts of Harm to Others: No Current Homicidal Intent: No Current Homicidal Plan: No Access to Homicidal Means: No Identified Victim: none History of harm to others?: No Assessment of Violence: None Noted Violent Behavior Description: none Does patient have access to weapons?: Yes (Comment)(gun in home) Criminal Charges Pending?: No Does patient have a court date: No Is patient on probation?: No  Psychosis Hallucinations: None noted Delusions: None noted  Mental Status Report Appearance/Hygiene: Unremarkable Eye Contact: Good Motor Activity: Freedom of movement Speech: Logical/coherent Level of Consciousness: Alert Mood: Depressed, Anxious Affect: Depressed Anxiety Level: None Thought Processes: Coherent, Relevant Judgement: Impaired Orientation: Person, Place,  Time, Situation Obsessive Compulsive Thoughts/Behaviors: Moderate  Cognitive Functioning Concentration: Normal Memory: Recent Intact, Remote Intact Is patient IDD: No Insight: Fair Impulse Control: Fair Appetite: Good Have you had any weight changes? : No Change Sleep: No Change Total Hours of Sleep: 7 Vegetative Symptoms: None  ADLScreening [facility] Assessment Services) Patient's cognitive ability adequate to safely complete daily activities?: Yes Patient able to express need for assistance with ADLs?: Yes Independently performs ADLs?: Yes (appropriate for developmental age)  Prior Inpatient Therapy Prior Inpatient Therapy: No  Prior Outpatient Therapy Prior Outpatient Therapy: Yes Prior Therapy Dates: active Prior Therapy Facilty/Provider(s): [facility] Reason for Treatment: depression and anxiety Does patient have an ACCT team?: No Does patient have Intensive In-House Services?  : No Does patient have [facility] services? : No Does patient have P4CC services?: No  ADL Screening (condition at time of admission) Patient's cognitive ability adequate to safely complete daily activities?: Yes Is the patient deaf or have difficulty hearing?: No Does the patient have difficulty seeing, even when wearing glasses/contacts?: No  Does the patient have difficulty concentrating, remembering, or making decisions?: No Patient able to express need for assistance with ADLs?: Yes Does the patient have difficulty dressing or bathing?: No Independently performs ADLs?: Yes (appropriate for developmental age) Does the patient have difficulty walking or climbing stairs?: No Weakness of Legs: None Weakness of Arms/Hands: None  Home Assistive Devices/Equipment Home Assistive Devices/Equipment: None  Therapy Consults (therapy consults require a physician order) PT Evaluation Needed: No OT Evalulation Needed: No SLP Evaluation Needed: No Abuse/Neglect Assessment (Assessment to be complete while  patient is alone) Abuse/Neglect Assessment Can Be Completed: Yes Physical Abuse: Denies Verbal Abuse: Denies Sexual Abuse: Denies Exploitation of patient/patient's resources: Denies Self-Neglect: Denies Values / Beliefs Cultural Requests During Hospitalization: None Spiritual Requests During Hospitalization: None Consults Spiritual Care Consult Needed: No Transition of Care Team Consult Needed: No Advance Directives (For Healthcare) Does Patient Have a Medical Advance Directive?: No Would patient like information on creating a medical advance directive?: No - Patient declined Nutrition Screen- [facility] Has the patient recently lost weight without trying?: No Has the patient been eating poorly because of a decreased appetite?: No Malnutrition Screening Tool Score: 0    Disposition: Per Merlyn Lot, NP, patient meets inpatient admission criteria.  Patient does not necessarily need to be limited to a geriatric psych inpatient. Disposition Initial Assessment Completed for this Encounter: Yes  This service was provided via telemedicine using a 2-way, interactive audio and video technology.  Names of all persons participating in this telemedicine service and their role in this encounter. Name: Zienna Ahlin Role: patient  Name: Kasandra Knudsen Khilynn Borntreger Role: TTS  Name:  Role:   Name:  Role:     Reatha Armour 10/28/2019 6:10 PM

## 2019-10-28 NOTE — ED Triage Notes (Signed)
Per pt, states she has a lot of deaths, including her son, in the past 3 years-states she is on Wellbutrin and a med for OCD-states she is overwhelmed, increased depressive symptoms-BH told family to come here

## 2019-10-29 ENCOUNTER — Emergency Department (HOSPITAL_COMMUNITY): Payer: Medicare Other

## 2019-10-29 DIAGNOSIS — F329 Major depressive disorder, single episode, unspecified: Secondary | ICD-10-CM | POA: Diagnosis not present

## 2019-10-29 DIAGNOSIS — F332 Major depressive disorder, recurrent severe without psychotic features: Secondary | ICD-10-CM

## 2019-10-29 LAB — RAPID URINE DRUG SCREEN, HOSP PERFORMED
Amphetamines: NOT DETECTED
Barbiturates: NOT DETECTED
Benzodiazepines: NOT DETECTED
Cocaine: NOT DETECTED
Opiates: NOT DETECTED
Tetrahydrocannabinol: NOT DETECTED

## 2019-10-29 LAB — SARS CORONAVIRUS 2 BY RT PCR (HOSPITAL ORDER, PERFORMED IN ~~LOC~~ HOSPITAL LAB): SARS Coronavirus 2: NEGATIVE

## 2019-10-29 MED ORDER — FLUVOXAMINE MALEATE 50 MG PO TABS
100.0000 mg | ORAL_TABLET | Freq: Two times a day (BID) | ORAL | Status: DC
  Administered 2019-10-29: 100 mg via ORAL
  Filled 2019-10-29: qty 2

## 2019-10-29 MED ORDER — EZETIMIBE 10 MG PO TABS
10.0000 mg | ORAL_TABLET | Freq: Every day | ORAL | Status: DC
  Administered 2019-10-29: 10 mg via ORAL
  Filled 2019-10-29: qty 1

## 2019-10-29 MED ORDER — PANTOPRAZOLE SODIUM 40 MG PO TBEC
40.0000 mg | DELAYED_RELEASE_TABLET | Freq: Every day | ORAL | Status: DC
  Administered 2019-10-29: 40 mg via ORAL
  Filled 2019-10-29: qty 1

## 2019-10-29 MED ORDER — IRBESARTAN 300 MG PO TABS
300.0000 mg | ORAL_TABLET | Freq: Every day | ORAL | Status: DC
  Administered 2019-10-29: 300 mg via ORAL
  Filled 2019-10-29: qty 1

## 2019-10-29 MED ORDER — RISPERIDONE 0.5 MG PO TABS
0.5000 mg | ORAL_TABLET | Freq: Two times a day (BID) | ORAL | Status: DC
  Administered 2019-10-29: 0.5 mg via ORAL
  Filled 2019-10-29: qty 1

## 2019-10-29 MED ORDER — METOPROLOL SUCCINATE ER 50 MG PO TB24
50.0000 mg | ORAL_TABLET | Freq: Every day | ORAL | Status: DC
  Filled 2019-10-29: qty 1

## 2019-10-29 MED ORDER — FLUTICASONE PROPIONATE 50 MCG/ACT NA SUSP
2.0000 | Freq: Every day | NASAL | Status: DC | PRN
  Filled 2019-10-29: qty 16

## 2019-10-29 MED ORDER — OXYCODONE-ACETAMINOPHEN 5-325 MG PO TABS
1.0000 | ORAL_TABLET | Freq: Three times a day (TID) | ORAL | Status: DC | PRN
  Administered 2019-10-29: 1 via ORAL
  Filled 2019-10-29: qty 1

## 2019-10-29 MED ORDER — BUPROPION HCL ER (XL) 150 MG PO TB24
300.0000 mg | ORAL_TABLET | Freq: Every morning | ORAL | Status: DC
  Administered 2019-10-29: 300 mg via ORAL
  Filled 2019-10-29: qty 2

## 2019-10-29 NOTE — Progress Notes (Signed)
10/29/2019  1230  Called [facility] transport to transport patient to [facility].

## 2019-10-29 NOTE — Progress Notes (Signed)
Pt accepted [facility] room 153-01 Dr. Caren Hazy is the accepting provider.  Call report to [phone] Melene Muller, RN @ [facility] ED notified.   Pt is Voluntary Pt may be transported by [facility] Pt scheduled to arrive as soon as transport has been set up.   Darletta Moll MSW, [place] Worker  [facility] Ph: [phone] Fax: [phone]  10/29/2019 11:27 AM

## 2019-10-29 NOTE — Progress Notes (Signed)
This patient is under review at [facility] for possible palcement. They are requesting an EKG and chest x-ray be completed before determination can be made. CSW provided this information to [place], Therapist, sports at [facility] ED.     Darletta Moll MSW, [place] Worker  [facility] Ph: [phone] Fax: [phone]

## 2019-10-29 NOTE — ED Notes (Signed)
Pt eat all her breakfast.

## 2019-10-29 NOTE — Consult Note (Addendum)
Tele Assessment Janet Peterson, 69 y.o., female patient presented to [facility] for evaluation of suicidal ideations with a plan to overdose on pills or shoot herself with a gun. Of note, the patient has access to firearms in her home.  Patient seen via telepsych by this provider; chart reviewed and consulted with Dr. Dwyane Dee on 10/29/19.  On evaluation Janet Peterson continues to endorse worsening depressive symptoms, anxiety and sleep distrubance.  Ruminates over her recent personal losses and endorses suicidal ideations, states she does not think she will go through with it. Has been taking Wellbutrin for several years, recently started on luvox in the past 2 months with no symptomatic improvement.    During evaluation Janet Peterson is laying on hospital bed.  She is alert/oriented x 4; depressed mood but cooperative; mood congruent with affect.  Patient is speaking in a clear tone at moderate volume, and normal pace; with fair eye contact.  Her thought process is of coherent but she focuses on ruminating content. There is no indication that she is currently responding to internal/external stimuli or experiencing delusional thought content.  Patient endorses suicidal/self-harm.  She denies homicidal ideation, psychosis, and paranoia.  Patient has remained calm throughout assessment and has answered questions appropriately.    Recommendations: Patient continues to meet inpatient criteria and voluntarily agrees to admission for mood stability.  SW has already faxed her out for accepting facilities.   Medications:  Wellbutrin 300mg  po daily Luvox 200mg  po BID   Her EKG normal.  Will add risperdal 0.5mg  BID for resistant depressive symptoms.    Disposition: Recommend psychiatric Inpatient admission when medically cleared.  Spoke with Dr. Roslynn Amble, [place]; informed of above recommendation and disposition

## 2019-10-29 NOTE — Progress Notes (Signed)
Patient meets criteria for inpatient treatment per Merlyn Lot, NP. No appropriate beds at [facility] currently. CSW faxed referrals to the following facilities for review:  [facility]   [facility]   [facility]   [facility]   TTS will continue to seek bed placement.     Darletta Moll MSW, [place] Worker Disposition  [facility] Ph: [phone] Fax: [phone] 10/29/2019 10:07 AM

## 2019-10-29 NOTE — ED Provider Notes (Signed)
Emergency Medicine Observation Re-evaluation Note  Janet Peterson is a 69 y.o. female, seen on rounds today.  Pt initially presented to the ED for complaints of Depression Currently, the patient is resting in bed comfortably in no distress, awaiting placement.  Physical Exam  BP (!) 174/87 (BP Location: Left Arm)   Pulse 82   Temp 98.6 F (37 C) (Oral)   Resp 20   SpO2 98%  Physical Exam Vitals and nursing note reviewed.  Constitutional:      General: She is not in acute distress.    Appearance: Normal appearance. She is not toxic-appearing.  Pulmonary:     Effort: Pulmonary effort is normal. No respiratory distress.     ED Course / MDM  EKG:  Clinical Course as of Oct 28 753  Sat Oct 28, 2019  1919 Psych recommending inpatient therapy. Agree with plan.   [JR]    Clinical Course User Index [JR] Robinson, [place] N, PA-C   I have reviewed the labs performed to date as well as medications administered while in observation.  Recent changes in the last 24 hours include awaiting placement. Plan  Current plan is for in pt psych placement. Patient is not under full IVC at this time.   Lucrezia Starch, MD 10/29/19 [phone]

## 2019-10-29 NOTE — Progress Notes (Signed)
10/29/2019  1313  Report called to Mayer Camel [phone]. Report given to [facility].

## 2019-11-06 ENCOUNTER — Other Ambulatory Visit: Payer: Self-pay

## 2019-11-06 NOTE — Progress Notes (Unsigned)
Last refilled: 09/12/2019 Last office visit: 09/12/2019

## 2019-11-07 NOTE — Progress Notes (Signed)
Left message return call

## 2019-11-07 NOTE — Progress Notes (Signed)
Patient has been hospitalized with suicidal ideation.  Denied until seen in follow up.  No action is necessary until she is discharged.

## 2019-11-08 ENCOUNTER — Other Ambulatory Visit: Payer: Self-pay

## 2019-11-08 NOTE — Telephone Encounter (Signed)
Requested Prescriptions   Pending Prescriptions Disp Refills  . oxyCODONE-acetaminophen (PERCOCET) 5-325 MG tablet 60 tablet 0    Sig: Take 1 tablet by mouth at bedtime.     Last OV 10/05/2019   Last ordered 09/12/2019

## 2019-11-10 ENCOUNTER — Other Ambulatory Visit: Payer: Self-pay

## 2019-11-10 ENCOUNTER — Ambulatory Visit (INDEPENDENT_AMBULATORY_CARE_PROVIDER_SITE_OTHER): Payer: Medicare Other | Admitting: Family Medicine

## 2019-11-10 VITALS — BP 140/80 | HR 78 | Temp 97.6°F | Wt 200.0 lb

## 2019-11-10 DIAGNOSIS — F332 Major depressive disorder, recurrent severe without psychotic features: Secondary | ICD-10-CM

## 2019-11-10 MED ORDER — UBRELVY 50 MG PO TABS: 50.0000 mg | ORAL_TABLET | Freq: Every day | ORAL | 0 refills | Status: DC | PRN

## 2019-11-10 MED ORDER — OXYCODONE-ACETAMINOPHEN 5-325 MG PO TABS: 1.0000 | ORAL_TABLET | Freq: Every day | ORAL | 0 refills | Status: DC

## 2019-11-10 MED ORDER — VITAMIN D (ERGOCALCIFEROL) 1.25 MG (50000 UNIT) PO CAPS
50000.0000 [IU] | ORAL_CAPSULE | ORAL | 5 refills | Status: DC
Start: 2019-11-10 — End: 2020-05-27

## 2019-11-10 NOTE — Progress Notes (Signed)
Subjective:    Patient ID: Janet Peterson, female    DOB: Nov 08, 1950, 69 y.o.   MRN: [id number]  HPI Patient was recently admitted to the hospital with suicidal ideation.  She has since been discharged from behavioral health and requested a refill on her oxycodone.  However I wanted to see the patient first prior to refilling a controlled substance.  She is here today with her husband.  They both endorse that she is doing much better.  At the time, she was thinking about her son who has died.  She was having thoughts of wanting to overdose on her medication.  She states that she is no longer having those thoughts.  She is in a much better place.  Her psychiatrist increased her dose of fluvoxamine.  She is using Percocet at night to help her sleep and occasionally during the daytime if she walks a long distance.  She was started on vitamin D for vitamin D deficiency while in the hospital.  Vitamin D level was in the 20s. Past Medical History:  Diagnosis Date  . Ankylosing spondylitis ([place])   . Anxiety and depression   . Depression   . Fibromyalgia   . GERD (gastroesophageal reflux disease)   . History of basal cell carcinoma excision    FACE, [place]  . History of hiatal hernia   . History of thrush   . Hyperlipidemia   . Hypertension   . Insomnia   . Left breast mass   . OCD (obsessive compulsive disorder)   . OSA (obstructive sleep apnea)    MILD AND NO CPAP SINCE SURGERY IN 2007  . Osteopenia   . PONV (postoperative nausea and vomiting)   . Psoriatic arthritis ([place])   . PVC (premature ventricular contraction)   . Rheumatoid arthritis [facility])    Past Surgical History:  Procedure Laterality Date  . BIOPSY  09/29/2019   Procedure: BIOPSY;  Surgeon: Ronnette Juniper, MD;  [facility];  Service: Gastroenterology;;  . BREAST LUMPECTOMY WITH RADIOACTIVE SEED LOCALIZATION Left 05/04/2018   Procedure: LEFT BREAST LUMPECTOMY WITH RADIOACTIVE SEED LOCALIZATION AND LEFT BREAST  NIPPLE BIOPSY;  Surgeon: Jovita Kussmaul, MD;  Location: [place];  Service: General;  Laterality: Left;  . BREAST SURGERY  1975   lumpectomy-- benign  . BUNIONECTOMY  1991  . CATARACT EXTRACTION W/ INTRAOCULAR LENS  IMPLANT, BILATERAL  2000  . CERVICAL FUSION  March 2013   C5 -- C7  . CESAREAN SECTION  1985  . COLONOSCOPY WITH PROPOFOL N/A 09/29/2019   Procedure: COLONOSCOPY WITH PROPOFOL;  Surgeon: Ronnette Juniper, MD;  [facility];  Service: Gastroenterology;  Laterality: N/A;  . DISTAL INTERPHALANGEAL JOINT FUSION Right 03/14/2015   Procedure: RIGHT LONG FINGER DISTAL INTERPHALANGEAL JOINT ARTHRODESIS;  Surgeon: Iran Planas, MD;  Location: [place];  Service: Orthopedics;  Laterality: Right;  . ESOPHAGOGASTRODUODENOSCOPY (EGD) WITH PROPOFOL N/A 09/29/2019   Procedure: ESOPHAGOGASTRODUODENOSCOPY (EGD) WITH PROPOFOL;  Surgeon: Ronnette Juniper, MD;  [facility];  Service: Gastroenterology;  Laterality: N/A;  . EYE SURGERY  1995   rk (laser surgery), semi cornea transplant, detacted retina,  fluid removal  . KNEE ARTHROSCOPY Left 03-03-2004  . POLYPECTOMY  09/29/2019   Procedure: POLYPECTOMY;  Surgeon: Ronnette Juniper, MD;  [facility];  Service: Gastroenterology;;  . POSTERIOR VITRECTOMY RIGHT EYE AND LASER   10-27-1999  . SHOULDER SURGERY Right 1997  . SPINAL FIXATION SURGERY W/ IMPLANT  2013  rod #1//  2014  rod 2   S1 -- T10  (rod #1)//   S1 -- T4 (rod #2)  . THORACIC FUSION  03-13-2013   REMOVAL HARDWARE/  BONE GRAFT FUSION T10//  REVISION OF RODS  . TOTAL KNEE ARTHROPLASTY Left 12-14-2005  . UVULOPALATOPHARYNGOPLASTY  04-26-2006   w/  TONSILLECTOMY/  TURBINATE REDUCTIONS/  BILATERAL ANTERIOR ETHMOIDECTOMY   Current Outpatient Medications on File Prior to Visit  Medication Sig Dispense Refill  . buPROPion (WELLBUTRIN XL) 300 MG 24 hr tablet Take 300 mg by mouth every morning.  1  . clonazePAM (KLONOPIN) 1 MG tablet TAKE 1 TABLET BY  MOUTH EVERY DAY AS NEEDED FOR ANXIETY 30 tablet 1  . ezetimibe (ZETIA) 10 MG tablet TAKE 1 TABLET BY MOUTH EVERY DAY (Patient taking differently: Take 10 mg by mouth daily after breakfast. ) 90 tablet 3  . fluticasone (FLONASE) 50 MCG/ACT nasal spray Place 2 sprays into both nostrils daily as needed for allergies.     . fluvoxaMINE (LUVOX) 100 MG tablet Take 125 mg by mouth in the morning, at noon, and at bedtime.     . Golimumab ([place] ARIA IV) Inject into the vein every 8 (eight) weeks.    . metoprolol succinate (TOPROL-XL) 50 MG 24 hr tablet TAKE 1 TABLET BY MOUTH ONCE DAILY FOLLOWING A MEAL (Patient taking differently: Take 50 mg by mouth daily after breakfast. ) 90 tablet 3  . naproxen sodium (ALEVE) 220 MG tablet Take 440 mg by mouth 2 (two) times daily as needed (pain).    Marland Kitchen omeprazole (PRILOSEC) 20 MG capsule Take 20 mg by mouth daily.    . Probiotic Product (PROBIOTIC PO) Take 1 tablet by mouth daily after breakfast.    . tiZANidine (ZANAFLEX) 4 MG tablet Take 1 tablet (4 mg total) by mouth every 6 (six) hours as needed for muscle spasms. 30 tablet 0  . valsartan (DIOVAN) 320 MG tablet TAKE 1 TABLET BY MOUTH EVERY DAY 90 tablet 3   No current facility-administered medications on file prior to visit.   Allergies  Allergen Reactions  . Latex Rash    And Mouth sores  . Amlodipine Besy-Benazepril Hcl Other (See Comments)    COUGH  . Augmentin [Amoxicillin-Pot Clavulanate] Diarrhea and Nausea And Vomiting  . Bextra [Valdecoxib] Diarrhea and Nausea And Vomiting    REFLUX  . Chlorhexidine     rash  . Lipitor [Atorvastatin Calcium] Other (See Comments)    MYALGIAS  . Sulfa Antibiotics Nausea And Vomiting    CRAMPING  . Zocor [Simvastatin] Other (See Comments)    MYALGIA   Social History   Socioeconomic History  . Marital status: Married    Spouse name: Not on file  . Number of children: Not on file  . Years of education: Not on file  . Highest education level: Not on file    Occupational History  . Not on file  Tobacco Use  . Smoking status: Former Smoker    Packs/day: 0.50    Years: 10.00    Pack years: 5.00    Types: Cigarettes    Quit date: 03/07/1995    Years since quitting: 24.6  . Smokeless tobacco: Never Used  Vaping Use  . Vaping Use: Never used  Substance and Sexual Activity  . Alcohol use: Yes    Comment: rare  . Drug use: Never  . Sexual activity: Not on file  Other Topics Concern  . Not on file  Social History Narrative  .  Not on file   Social Determinants of Health   Financial Resource Strain:   . Difficulty of Paying Living Expenses:   Food Insecurity:   . Worried About Charity fundraiser in the Last Year:   . Arboriculturist in the Last Year:   Transportation Needs:   . Film/video editor (Medical):   Marland Kitchen Lack of Transportation (Non-Medical):   Physical Activity:   . Days of Exercise per Week:   . Minutes of Exercise per Session:   Stress:   . Feeling of Stress :   Social Connections:   . Frequency of Communication with Friends and Family:   . Frequency of Social Gatherings with Friends and Family:   . Attends Religious Services:   . Active Member of Clubs or Organizations:   . Attends Archivist Meetings:   Marland Kitchen Marital Status:   Intimate Partner Violence:   . Fear of Current or Ex-Partner:   . Emotionally Abused:   Marland Kitchen Physically Abused:   . Sexually Abused:     Review of Systems  All other systems reviewed and are negative.      Objective:   Physical Exam Vitals reviewed.  Constitutional:      General: She is not in acute distress.    Appearance: Normal appearance. She is normal weight. She is not ill-appearing or toxic-appearing.  Cardiovascular:     Rate and Rhythm: Normal rate and regular rhythm.     Heart sounds: Normal heart sounds. No murmur heard.  No friction rub. No gallop.   Pulmonary:     Effort: Pulmonary effort is normal. No respiratory distress.     Breath sounds: Normal breath  sounds. No wheezing, rhonchi or rales.  Abdominal:     General: Abdomen is flat. Bowel sounds are normal.     Palpations: Abdomen is soft.  Musculoskeletal:     Right lower leg: No edema.     Left lower leg: No edema.  Neurological:     Mental Status: She is alert.           Assessment & Plan:  Severe episode of recurrent major depressive disorder, without psychotic features [facility])  Patient states that she is doing better.  I am no longer concerned that she is actively suicidal.  I feel comfortable refilling her Percocet that she has been taking for quite some time.  Both she and her husband state that she is doing better with no concerns.  Therefore I will refill her Percocet, 60 tablets/month.  I will also refill the Roselyn Meier that she is taking as needed for migraines.  I recommended vitamin D 50,000 units weekly for 6 months and then recheck vitamin D level.  Also recommended that she decrease her Prilosec to 40 mg a day and if tolerating this in the future could try switching to Pepcid.

## 2019-11-21 ENCOUNTER — Telehealth: Payer: Self-pay | Admitting: Rheumatology

## 2019-11-21 NOTE — Telephone Encounter (Signed)
She will need to be cleared by PCP prior to resuming Simponi Aria infusions.

## 2019-11-21 NOTE — Telephone Encounter (Signed)
Patient called stating she was in the hospital and prescribed antibiotics.  Patient is requesting a return call to let her know when she can restart Simponi.

## 2019-11-21 NOTE — Telephone Encounter (Signed)
Patient states she was in the hospital and on antibiotics. Patient states she has completed the antibiotics about 3 weeks ago. Patient states she was treated for C-Diff and colitis. Patient would like to know when she may restart her Simponi. Please advise.

## 2019-11-22 ENCOUNTER — Other Ambulatory Visit: Payer: Self-pay

## 2019-11-22 MED ORDER — METOPROLOL SUCCINATE ER 50 MG PO TB24: ORAL_TABLET | ORAL | 3 refills | Status: DC

## 2019-11-22 NOTE — Telephone Encounter (Signed)
Patient advised she will need to be cleared by PCP prior to resuming Simponi Aria infusions. Patient advised to have the PCP send letter for clearance.

## 2019-11-24 ENCOUNTER — Encounter (HOSPITAL_COMMUNITY): Payer: Medicare Other

## 2019-12-05 ENCOUNTER — Other Ambulatory Visit: Payer: Self-pay

## 2019-12-05 MED ORDER — UBRELVY 50 MG PO TABS: 50.0000 mg | ORAL_TABLET | Freq: Every day | ORAL | 0 refills | Status: DC | PRN

## 2019-12-07 ENCOUNTER — Telehealth: Payer: Self-pay | Admitting: *Deleted

## 2019-12-07 MED ORDER — UBRELVY 50 MG PO TABS: 50.0000 mg | ORAL_TABLET | Freq: Every day | ORAL | 0 refills | Status: DC | PRN

## 2019-12-07 NOTE — Telephone Encounter (Signed)
Received request from pharmacy for [place] on Ubrelvy.  Dx: R37.366- migraine disorder.   PA unable to be submitted via  CoverMyMeds.   Faxed request to insurance.,   Received PA determination.   [employer] [id number]  Approved 09/08/2019- 12/06/2020.  Pharmacy made aware.

## 2019-12-08 ENCOUNTER — Other Ambulatory Visit: Payer: Self-pay

## 2019-12-08 MED ORDER — UBRELVY 50 MG PO TABS: 50.0000 mg | ORAL_TABLET | Freq: Every day | ORAL | 0 refills | Status: DC | PRN

## 2019-12-11 ENCOUNTER — Other Ambulatory Visit: Payer: Self-pay | Admitting: Family Medicine

## 2019-12-11 NOTE — Telephone Encounter (Signed)
Ok to refill 

## 2019-12-14 ENCOUNTER — Ambulatory Visit (INDEPENDENT_AMBULATORY_CARE_PROVIDER_SITE_OTHER): Payer: Medicare Other | Admitting: Family Medicine

## 2019-12-14 ENCOUNTER — Other Ambulatory Visit: Payer: Self-pay

## 2019-12-14 VITALS — BP 110/70 | HR 81 | Temp 98.3°F | Ht 64.0 in | Wt 200.0 lb

## 2019-12-14 DIAGNOSIS — M797 Fibromyalgia: Secondary | ICD-10-CM | POA: Diagnosis not present

## 2019-12-14 DIAGNOSIS — L4050 Arthropathic psoriasis, unspecified: Secondary | ICD-10-CM

## 2019-12-14 MED ORDER — OXYCODONE-ACETAMINOPHEN 5-325 MG PO TABS: 1.0000 | ORAL_TABLET | Freq: Every day | ORAL | 0 refills | Status: DC

## 2019-12-14 NOTE — Progress Notes (Signed)
Subjective:    Patient ID: Janet Peterson, female    DOB: February 11, 1951, 69 y.o.   MRN: [id number]  HPI  6/21 Patient was recently admitted to the hospital with suicidal ideation.  She has since been discharged from behavioral health and requested a refill on her oxycodone.  However I wanted to see the patient first prior to refilling a controlled substance.  She is here today with her husband.  They both endorse that she is doing much better.  At the time, she was thinking about her son who has died.  She was having thoughts of wanting to overdose on her medication.  She states that she is no longer having those thoughts.  She is in a much better place.  Her psychiatrist increased her dose of fluvoxamine.  She is using Percocet at night to help her sleep and occasionally during the daytime if she walks a long distance.  She was started on vitamin D for vitamin D deficiency while in the hospital.  Vitamin D level was in the 20s.  At that time, my plan was: Patient states that she is doing better.  I am no longer concerned that she is actively suicidal.  I feel comfortable refilling her Percocet that she has been taking for quite some time.  Both she and her husband state that she is doing better with no concerns.  Therefore I will refill her Percocet, 60 tablets/month.  I will also refill the Roselyn Meier that she is taking as needed for migraines.  I recommended vitamin D 50,000 units weekly for 6 months and then recheck vitamin D level.  Also recommended that she decrease her Prilosec to 40 mg a day and if tolerating this in the future could try switching to Pepcid.  12/14/19 Blood pressure today is well controlled at 110/70. Patient is going on a 1 month vacation across the [place]. However her prescription will expire before she returns home from her vacation. She takes 1 Percocet a day due to pain in her joints and in her neck due to the psoriatic arthritis. She is asking that I refill her  prescription earlier so that she can take it with her. I would be happy to do that. She shows no evidence of abuse or diversion. She is also requesting a heel lift for her right shoe. 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 She benefits from a shoe lift on the right foot to help correct this which helps with hip pain and lower back pain Past Medical History:  Diagnosis Date  . Ankylosing spondylitis ([place])   . Anxiety and depression   . Depression   . Fibromyalgia   . GERD (gastroesophageal reflux disease)   . History of basal cell carcinoma excision    FACE, [place]  . History of hiatal hernia   . History of thrush   . Hyperlipidemia   . Hypertension   . Insomnia   . Left breast mass   . OCD (obsessive compulsive disorder)   . OSA (obstructive sleep apnea)    MILD AND NO CPAP SINCE SURGERY IN 2007  . Osteopenia   . PONV (postoperative nausea and vomiting)   . Psoriatic arthritis ([place])   . PVC (premature ventricular contraction)   . Rheumatoid arthritis [facility])    Past Surgical History:  Procedure Laterality Date  . BIOPSY  09/29/2019   Procedure: BIOPSY;  Surgeon: Ronnette Juniper, MD;  Location: [facility];  Service: Gastroenterology;;  . BREAST LUMPECTOMY WITH RADIOACTIVE SEED LOCALIZATION Left 05/04/2018   Procedure: LEFT BREAST LUMPECTOMY WITH RADIOACTIVE SEED LOCALIZATION AND LEFT BREAST NIPPLE BIOPSY;  Surgeon: Jovita Kussmaul, MD;  Location: [place];  Service: General;  Laterality: Left;  . BREAST SURGERY  1975   lumpectomy-- benign  . BUNIONECTOMY  1991  . CATARACT EXTRACTION W/ INTRAOCULAR LENS  IMPLANT, BILATERAL  2000  . CERVICAL FUSION  March 2013   C5 -- C7  . CESAREAN SECTION  1985  . COLONOSCOPY WITH PROPOFOL N/A 09/29/2019   Procedure: COLONOSCOPY WITH PROPOFOL;  Surgeon: Ronnette Juniper, MD;  [facility];  Service: Gastroenterology;  Laterality: N/A;  . DISTAL INTERPHALANGEAL  JOINT FUSION Right 03/14/2015   Procedure: RIGHT LONG FINGER DISTAL INTERPHALANGEAL JOINT ARTHRODESIS;  Surgeon: Iran Planas, MD;  Location: [place];  Service: Orthopedics;  Laterality: Right;  . ESOPHAGOGASTRODUODENOSCOPY (EGD) WITH PROPOFOL N/A 09/29/2019   Procedure: ESOPHAGOGASTRODUODENOSCOPY (EGD) WITH PROPOFOL;  Surgeon: Ronnette Juniper, MD;  [facility];  Service: Gastroenterology;  Laterality: N/A;  . EYE SURGERY  1995   rk (laser surgery), semi cornea transplant, detacted retina,  fluid removal  . KNEE ARTHROSCOPY Left 03-03-2004  . POLYPECTOMY  09/29/2019   Procedure: POLYPECTOMY;  Surgeon: Ronnette Juniper, MD;  [facility];  Service: Gastroenterology;;  . POSTERIOR VITRECTOMY RIGHT EYE AND LASER   10-27-1999  . SHOULDER SURGERY Right 1997  . SPINAL FIXATION SURGERY W/ IMPLANT  2013 rod #1//  2014  rod 2   S1 -- T10  (rod #1)//   S1 -- T4 (rod #2)  . THORACIC FUSION  03-13-2013   REMOVAL HARDWARE/  BONE GRAFT FUSION T10//  REVISION OF RODS  . TOTAL KNEE ARTHROPLASTY Left 12-14-2005  . UVULOPALATOPHARYNGOPLASTY  04-26-2006   w/  TONSILLECTOMY/  TURBINATE REDUCTIONS/  BILATERAL ANTERIOR ETHMOIDECTOMY   Current Outpatient Medications on File Prior to Visit  Medication Sig Dispense Refill  . buPROPion (WELLBUTRIN XL) 300 MG 24 hr tablet Take 300 mg by mouth every morning.  1  . clonazePAM (KLONOPIN) 1 MG tablet TAKE 1 TABLET BY MOUTH EVERY DAY AS NEEDED FOR ANXIETY 30 tablet 1  . ezetimibe (ZETIA) 10 MG tablet TAKE 1 TABLET BY MOUTH EVERY DAY (Patient taking differently: Take 10 mg by mouth daily after breakfast. ) 90 tablet 3  . fluticasone (FLONASE) 50 MCG/ACT nasal spray Place 2 sprays into both nostrils daily as needed for allergies.     . fluvoxaMINE (LUVOX) 100 MG tablet Take 125 mg by mouth in the morning, at noon, and at bedtime.     . Golimumab ([place] ARIA IV) Inject into the vein every 8 (eight) weeks.    . metoprolol succinate (TOPROL-XL) 50  MG 24 hr tablet TAKE 1 TABLET BY MOUTH ONCE DAILY FOLLOWING A MEAL 90 tablet 3  . naproxen sodium (ALEVE) 220 MG tablet Take 440 mg by mouth 2 (two) times daily as needed (pain).    Marland Kitchen omeprazole (PRILOSEC) 20 MG capsule Take 20 mg by mouth daily.    . Probiotic Product (PROBIOTIC PO) Take 1 tablet by mouth daily after breakfast.    . tiZANidine (ZANAFLEX) 4 MG tablet TAKE 1 TABLET (4 MG TOTAL) BY MOUTH EVERY 6 (SIX) HOURS AS NEEDED FOR MUSCLE SPASMS. 30 tablet 0  . Ubrogepant (UBRELVY) 50 MG TABS Take 50 mg by mouth daily as needed. 30 tablet 0  . valsartan (  DIOVAN) 320 MG tablet TAKE 1 TABLET BY MOUTH EVERY DAY 90 tablet 3  . Vitamin D, Ergocalciferol, (DRISDOL) 1.25 MG (50000 UNIT) CAPS capsule Take 1 capsule (50,000 Units total) by mouth every 7 (seven) days. 5 capsule 5   No current facility-administered medications on file prior to visit.   Allergies  Allergen Reactions  . Latex Rash    And Mouth sores  . Amlodipine Besy-Benazepril Hcl Other (See Comments)    COUGH  . Augmentin [Amoxicillin-Pot Clavulanate] Diarrhea and Nausea And Vomiting  . Bextra [Valdecoxib] Diarrhea and Nausea And Vomiting    REFLUX  . Chlorhexidine     rash  . Lipitor [Atorvastatin Calcium] Other (See Comments)    MYALGIAS  . Sulfa Antibiotics Nausea And Vomiting    CRAMPING  . Zocor [Simvastatin] Other (See Comments)    MYALGIA   Social History   Socioeconomic History  . Marital status: Married    Spouse name: Not on file  . Number of children: Not on file  . Years of education: Not on file  . Highest education level: Not on file  Occupational History  . Not on file  Tobacco Use  . Smoking status: Former Smoker    Packs/day: 0.50    Years: 10.00    Pack years: 5.00    Types: Cigarettes    Quit date: 03/07/1995    Years since quitting: 24.7  . Smokeless tobacco: Never Used  Vaping Use  . Vaping Use: Never used  Substance and Sexual Activity  . Alcohol use: Yes    Comment: rare  . Drug  use: Never  . Sexual activity: Not on file  Other Topics Concern  . Not on file  Social History Narrative  . Not on file   Social Determinants of Health   Financial Resource Strain:   . Difficulty of Paying Living Expenses:   Food Insecurity:   . Worried About Charity fundraiser in the Last Year:   . Arboriculturist in the Last Year:   Transportation Needs:   . Film/video editor (Medical):   Marland Kitchen Lack of Transportation (Non-Medical):   Physical Activity:   . Days of Exercise per Week:   . Minutes of Exercise per Session:   Stress:   . Feeling of Stress :   Social Connections:   . Frequency of Communication with Friends and Family:   . Frequency of Social Gatherings with Friends and Family:   . Attends Religious Services:   . Active Member of Clubs or Organizations:   . Attends Archivist Meetings:   Marland Kitchen Marital Status:   Intimate Partner Violence:   . Fear of Current or Ex-Partner:   . Emotionally Abused:   Marland Kitchen Physically Abused:   . Sexually Abused:     Review of Systems  All other systems reviewed and are negative.      Objective:   Physical Exam Vitals reviewed.  Constitutional:      General: She is not in acute distress.    Appearance: Normal appearance. She is normal weight. She is not ill-appearing or toxic-appearing.  Cardiovascular:     Rate and Rhythm: Normal rate and regular rhythm.     Heart sounds: Normal heart sounds. No murmur heard.  No friction rub. No gallop.   Pulmonary:     Effort: Pulmonary effort is normal. No respiratory distress.     Breath sounds: Normal breath sounds. No wheezing, rhonchi or rales.  Abdominal:  General: Abdomen is flat. Bowel sounds are normal.     Palpations: Abdomen is soft.  Musculoskeletal:     Right lower leg: No edema.     Left lower leg: No edema.  Neurological:     Mental Status: She is alert.           Assessment & Plan:  Fibromyalgia syndrome  Psoriatic arthritis ([place])  I will  gladly refill the patient's Percocet for 30 tablets. She can get this earlier so that she can carry it with her on her vacation. She will continue to use 1 to 2 tablets a day as needed for pain. Next refill should not be due therefore until September. I also wrote the patient a prescription for a shoe lift for her right leg

## 2019-12-19 ENCOUNTER — Other Ambulatory Visit: Payer: Self-pay

## 2019-12-21 ENCOUNTER — Telehealth: Payer: Self-pay

## 2019-12-21 NOTE — Telephone Encounter (Signed)
Sent PA via CMM. PA for Ubrogepant (UBRELVY) 50 MG TABS was approved from m 09/22/2019 - 05/24/2020

## 2019-12-26 ENCOUNTER — Ambulatory Visit: Payer: Medicare Other | Admitting: Rheumatology

## 2020-01-21 ENCOUNTER — Other Ambulatory Visit: Payer: Self-pay | Admitting: Family Medicine

## 2020-01-23 NOTE — Progress Notes (Signed)
Office Visit Note  Patient: Janet Peterson             Date of Birth: Sep 04, 1950           MRN: [id number]             PCP: Susy Frizzle, MD Referring: Susy Frizzle, MD Visit Date: 02/06/2020 Occupation: @GUAROCC @  Subjective:  Other (patient states she would like to resume arava, she has not had a Simponi Aria infusion since March/April)   History of Present Illness: Janet Peterson is a 69 y.o. female with history of psoriatic arthritis, psoriasis and osteoarthritis.  She was on Simponi Aria infusions.  She states she developed C. difficile infection in March.  After that she stopped Simponi Aria infusions.  She wants to start on leflunomide.  She believes it was more effective than Simponi Aria infusions.  She used to be on leflunomide several years ago it was discontinued due to neutropenia in combination with Simponi Aria.  She says she has been experiencing increased discomfort in her hands and her knee joints.  Especially her right knee joint has been more painful.  Her left knee joint is replaced which is doing well.  She has some stiffness in her neck and lower back.  She is also having some increased psoriasis patches in her scalp and her lower extremities.  Activities of Daily Living:  Patient reports morning stiffness for 1 hour.   Patient Reports nocturnal pain.  Difficulty dressing/grooming: Reports Difficulty climbing stairs: Reports Difficulty getting out of chair: Reports Difficulty using hands for taps, buttons, cutlery, and/or writing: Reports  Review of Systems  Constitutional: Positive for fatigue.  HENT: Negative for mouth sores, mouth dryness and nose dryness.   Eyes: Positive for dryness. Negative for pain and itching.  Respiratory: Positive for shortness of breath and difficulty breathing.   Cardiovascular: Negative for chest pain and palpitations.  Gastrointestinal: Negative for blood in stool, constipation and diarrhea.  Endocrine: Negative for  increased urination.  Genitourinary: Negative for difficulty urinating.  Musculoskeletal: Positive for arthralgias, joint pain, joint swelling, myalgias, morning stiffness, muscle tenderness and myalgias.  Skin: Negative for color change, rash and redness.  Allergic/Immunologic: Negative for susceptible to infections.  Neurological: Positive for headaches and weakness. Negative for dizziness and memory loss.  Hematological: Positive for bruising/bleeding tendency.  Psychiatric/Behavioral: Negative for confusion and sleep disturbance.    PMFS History:  Patient Active Problem List   Diagnosis Date Noted  . C. difficile colitis 09/29/2019  . Colitis 09/28/2019  . Anxiety and depression 09/28/2019  . DDD (degenerative disc disease), cervical s/p fusion 11/12/2016  . H/O total knee replacement, left 05/06/2016  . DDD lumbar spine status post fusion 05/06/2016  . Psoriasis 05/05/2016  . High risk medication use 05/05/2016  . Cervical post-laminectomy syndrome 12/09/2015  . Thoracic postlaminectomy syndrome 12/09/2015  . Psoriatic arthritis ([place]) 08/23/2012  . Osteopenia 08/23/2012  . HLD (hyperlipidemia) 08/23/2012  . RLS (restless legs syndrome) 08/23/2012  . Insomnia 08/23/2012  . Fibromyalgia syndrome 08/12/2012  . Low back pain 08/12/2012  . Syncope   . PVC (premature ventricular contraction)   . OCD (obsessive compulsive disorder)   . GERD (gastroesophageal reflux disease)   . Hypertension     Past Medical History:  Diagnosis Date  . Ankylosing spondylitis ([place])   . Anxiety and depression   . Depression   . Fibromyalgia   . GERD (gastroesophageal reflux disease)   . History of basal cell  carcinoma excision    FACE, [place]  . History of hiatal hernia   . History of thrush   . Hyperlipidemia   . Hypertension   . Insomnia   . Left breast mass   . OCD (obsessive compulsive disorder)   . OSA (obstructive sleep apnea)    MILD AND NO CPAP SINCE SURGERY IN 2007  .  Osteopenia   . PONV (postoperative nausea and vomiting)   . Psoriatic arthritis ([place])   . PVC (premature ventricular contraction)   . Rheumatoid arthritis ([place])     Family History  Problem Relation Age of Onset  . Diabetes Mother   . Heart disease Mother   . Diabetes Father   . Anuerysm Father   . Diabetes Brother   . Heart disease Brother   . Diabetes Brother   . Heart disease Brother    Past Surgical History:  Procedure Laterality Date  . BIOPSY  09/29/2019   Procedure: BIOPSY;  Surgeon: Ronnette Juniper, MD;  Location: [facility];  Service: Gastroenterology;;  . BREAST LUMPECTOMY WITH RADIOACTIVE SEED LOCALIZATION Left 05/04/2018   Procedure: LEFT BREAST LUMPECTOMY WITH RADIOACTIVE SEED LOCALIZATION AND LEFT BREAST NIPPLE BIOPSY;  Surgeon: Jovita Kussmaul, MD;  Location: [facility];  Service: General;  Laterality: Left;  . BREAST SURGERY  1975   lumpectomy-- benign  . BUNIONECTOMY  1991  . CATARACT EXTRACTION W/ INTRAOCULAR LENS  IMPLANT, BILATERAL  2000  . CERVICAL FUSION  March 2013   C5 -- C7  . CESAREAN SECTION  1985  . COLONOSCOPY WITH PROPOFOL N/A 09/29/2019   Procedure: COLONOSCOPY WITH PROPOFOL;  Surgeon: Ronnette Juniper, MD;  Location: [facility];  Service: Gastroenterology;  Laterality: N/A;  . DISTAL INTERPHALANGEAL JOINT FUSION Right 03/14/2015   Procedure: RIGHT LONG FINGER DISTAL INTERPHALANGEAL JOINT ARTHRODESIS;  Surgeon: Iran Planas, MD;  Location: [place] [facility];  Service: Orthopedics;  Laterality: Right;  . ESOPHAGOGASTRODUODENOSCOPY (EGD) WITH PROPOFOL N/A 09/29/2019   Procedure: ESOPHAGOGASTRODUODENOSCOPY (EGD) WITH PROPOFOL;  Surgeon: Ronnette Juniper, MD;  Location: [facility];  Service: Gastroenterology;  Laterality: N/A;  . EYE SURGERY  1995   rk (laser surgery), semi cornea transplant, detacted retina,  fluid removal  . KNEE ARTHROSCOPY Left 03-03-2004  . POLYPECTOMY  09/29/2019   Procedure: POLYPECTOMY;  Surgeon: Ronnette Juniper, MD;   Location: [facility];  Service: Gastroenterology;;  . POSTERIOR VITRECTOMY RIGHT EYE AND LASER   10-27-1999  . SHOULDER SURGERY Right 1997  . SPINAL FIXATION SURGERY W/ IMPLANT  2013 rod #1//  2014  rod 2   S1 -- T10  (rod #1)//   S1 -- T4 (rod #2)  . THORACIC FUSION  03-13-2013   REMOVAL HARDWARE/  BONE GRAFT FUSION T10//  REVISION OF RODS  . TOTAL KNEE ARTHROPLASTY Left 12-14-2005  . UVULOPALATOPHARYNGOPLASTY  04-26-2006   w/  TONSILLECTOMY/  TURBINATE REDUCTIONS/  BILATERAL ANTERIOR ETHMOIDECTOMY   Social History   Social History Narrative  . Not on file   Immunization History  Administered Date(s) Administered  . Fluad Quad(high Dose 65+) 01/26/2019  . Influenza Whole 03/04/2012  . Influenza, High Dose Seasonal PF 03/25/2018  . Influenza,inj,Quad PF,6+ Mos 02/28/2013, 03/07/2014, 02/28/2015, 03/27/2016, 03/09/2017  . Pneumococcal Conjugate-13 09/10/2016  . Pneumococcal Polysaccharide-23 03/12/2006, 11/09/2011  . Unspecified SARS-COV-2 Vaccination 08/07/2019, 08/28/2019     Objective: Vital Signs: BP 125/87 (BP Location: Left Arm, Patient Position: Sitting, Cuff Size: Normal)   Pulse 73   Resp 15  Ht 5\' 4"  (1.626 m)   Wt 202 lb (91.6 kg)   BMI 34.67 kg/m    Physical Exam Vitals and nursing note reviewed.  Constitutional:      Appearance: She is well-developed.  HENT:     Head: Normocephalic and atraumatic.  Eyes:     Conjunctiva/sclera: Conjunctivae normal.  Cardiovascular:     Rate and Rhythm: Normal rate and regular rhythm.     Heart sounds: Normal heart sounds.  Pulmonary:     Effort: Pulmonary effort is normal.     Breath sounds: Normal breath sounds.  Abdominal:     General: Bowel sounds are normal.     Palpations: Abdomen is soft.  Musculoskeletal:     Cervical back: Normal range of motion.  Lymphadenopathy:     Cervical: No cervical adenopathy.  Skin:    General: Skin is warm and dry.     Capillary Refill: Capillary refill takes less than 2  seconds.  Neurological:     Mental Status: She is alert and oriented to person, place, and time.  Psychiatric:        Behavior: Behavior normal.      Musculoskeletal Exam: She has limited range of motion of her cervical and lumbar spine.  Shoulder joints and elbow joints with good range of motion.  She has bilateral DIP and PIP thickening with no synovitis.  Hip joints in good range of motion.  She is some warmth on palpation of her right knee joint.  Left knee joint is replaced.  No synovitis was noted over PIP and DIPs of her feet.  CDAI Exam: CDAI Score: -- Patient Global: --; Provider Global: -- Swollen: --; Tender: -- Joint Exam 02/06/2020   No joint exam has been documented for this visit   There is currently no information documented on the homunculus. Go to the Rheumatology activity and complete the homunculus joint exam.  Investigation: No additional findings.  Imaging: No results found.  Recent Labs: Lab Results  Component Value Date   WBC 7.5 10/28/2019   HGB 13.3 10/28/2019   PLT 257 10/28/2019   NA 138 10/28/2019   K 3.5 10/28/2019   CL 99 10/28/2019   CO2 27 10/28/2019   GLUCOSE 146 (H) 10/28/2019   BUN 11 10/28/2019   CREATININE 0.92 10/28/2019   BILITOT 0.2 (L) 10/28/2019   ALKPHOS 118 10/28/2019   AST 16 10/28/2019   ALT 13 10/28/2019   PROT 7.0 10/28/2019   ALBUMIN 3.8 10/28/2019   CALCIUM 10.1 10/28/2019   GFRAA >60 10/28/2019   QFTBGOLD Indeterminate 08/14/2016   QFTBGOLDPLUS Negative 12/22/2018    Speciality Comments: Simponi Aria every 8 weeks started Jan 2018  TB gold negative 08/19/16  Procedures:  No procedures performed Allergies: Latex, Amlodipine besy-benazepril hcl, Augmentin [amoxicillin-pot clavulanate], Bextra [valdecoxib], Chlorhexidine, Lipitor [atorvastatin calcium], Sulfa antibiotics, and Zocor [simvastatin]   Assessment / Plan:     Visit Diagnoses: Psoriatic arthritis (HCC)-she does not have much synovitis on examination  today.  She is some warmth on palpation of her right knee joint.  She states she does much better when she takes leflunomide.  She stopped Simponi Aria after developing C. difficile infection.  Indications side effects contraindications of leflunomide were discussed at length.  She voiced understanding's.  She wants to proceed with leflunomide.  My plan is to start her on leflunomide 20 mg p.o. daily.  We will check labs in 2 weeks, 2 months and then every 3 months to monitor for  drug toxicity.  Psoriasis-she has dry skin but I did not see any active psoriasis patches.  She states she has patches in her scalp and her lower extremities and they are better currently as she has been using some topical agents.  High risk medication use -(Simponi Aria IV infusion 2 mg/kg every 8 weeks-patient discontinued in March 2021 due to C. difficile infection.)  She will be starting leflunomide.  Medication counseling:   Baseline Immunosuppressant Therapy Labs  Quantiferon TB Gold Latest Ref Rng & Units 12/22/2018  Quantiferon TB Gold Plus Negative Negative       Lab Results  Component Value Date   HIV Non Reactive 09/29/2019       Serum Protein Electrophoresis Latest Ref Rng & Units 10/28/2019  Total Protein 6.5 - 8.1 g/dL 7.0    No results found for: G6PDH  No results found for: TPMT   Patient was counseled on the purpose, proper use, and adverse effects of leflunomide including risk of infection, nausea/diarrhea/weight loss, increase in blood pressure, rash, hair loss, tingling in the hands and feet, and signs and symptoms of interstitial lung disease.   Also counseled on Black Box warning of liver injury and importance of avoiding alcohol while on therapy. Discussed that there is the possibility of an increased risk of malignancy but it is not well understood if this increased risk is due to the medication or the disease state.  Counseled patient to avoid live vaccines. Recommend annual influenza,  Pneumovax 23, Prevnar 13, and Shingrix as indicated.   Discussed the importance of frequent monitoring of liver function and blood count.  Standing orders placed.   Provided patient with educational materials on leflunomide and answered all questions.  Patient consented to [place] use, and consent will be uploaded into the media tab.   Patient dose will be 20 mg p.o. daily.  I reviewed her labs from June 2021 which was stable.  Primary osteoarthritis of both hands-she has discomfort in her hands due to underlying osteoarthritis.  She describes pain in her [facility] PIP and DIP joints.  Primary osteoarthritis of right knee-she has chronic discomfort in her knee joints due to osteoarthritis.  H/O total knee replacement, left-doing well.  DDD (degenerative disc disease), cervical s/p fusion-she has limited range of motion.  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  Fibromyalgia-she continues to have generalized pain and discomfort.  Other fatigue-due to fibromyalgia and insomnia.  Primary insomnia-good sleep hygiene was discussed.  Other medical problems are listed as follows:  History of OCD (obsessive compulsive disorder)  History of gastroesophageal reflux (GERD)  RLS (restless legs syndrome)  History of migraine  History of hyperlipidemia  History of hypertension  History of vitamin D deficiency  Educated about COVID-19 virus infection-she has been exposed to get COVID-19 booster vaccine.  Use of mask, social distancing and hand hygiene was emphasized.  I also discussed in case she develops COVID-19 infection she may be candidate for COVID-19 monoclonal antibody infusion..  Orders: No orders of the defined types were placed in this encounter.  Meds ordered this encounter  Medications  . leflunomide (ARAVA) 20 MG tablet    Sig: Take 1 tablet (20 mg total) by mouth daily.    Dispense:  30 tablet    Refill:  2      Follow-Up Instructions: Return in about 2 months (around 04/07/2020) for Psoriatic arthritis, Osteoarthritis.   Bo Merino, MD  Note - This record  has been created using [employer].  Chart creation errors have been sought, but may not always  have been located. Such creation errors do not reflect on  the standard of medical care.

## 2020-02-06 ENCOUNTER — Ambulatory Visit (INDEPENDENT_AMBULATORY_CARE_PROVIDER_SITE_OTHER): Payer: Medicare Other | Admitting: Rheumatology

## 2020-02-06 ENCOUNTER — Other Ambulatory Visit: Payer: Self-pay

## 2020-02-06 ENCOUNTER — Telehealth: Payer: Self-pay

## 2020-02-06 ENCOUNTER — Encounter: Payer: Self-pay | Admitting: Rheumatology

## 2020-02-06 ENCOUNTER — Other Ambulatory Visit: Payer: Self-pay | Admitting: Family Medicine

## 2020-02-06 VITALS — BP 125/87 | HR 73 | Resp 15 | Ht 64.0 in | Wt 202.0 lb

## 2020-02-06 DIAGNOSIS — M1711 Unilateral primary osteoarthritis, right knee: Secondary | ICD-10-CM

## 2020-02-06 DIAGNOSIS — M19042 Primary osteoarthritis, left hand: Secondary | ICD-10-CM

## 2020-02-06 DIAGNOSIS — Z8679 Personal history of other diseases of the circulatory system: Secondary | ICD-10-CM

## 2020-02-06 DIAGNOSIS — L409 Psoriasis, unspecified: Secondary | ICD-10-CM | POA: Diagnosis not present

## 2020-02-06 DIAGNOSIS — Z7189 Other specified counseling: Secondary | ICD-10-CM

## 2020-02-06 DIAGNOSIS — M797 Fibromyalgia: Secondary | ICD-10-CM

## 2020-02-06 DIAGNOSIS — Z8719 Personal history of other diseases of the digestive system: Secondary | ICD-10-CM

## 2020-02-06 DIAGNOSIS — Z96652 Presence of left artificial knee joint: Secondary | ICD-10-CM

## 2020-02-06 DIAGNOSIS — M19041 Primary osteoarthritis, right hand: Secondary | ICD-10-CM | POA: Diagnosis not present

## 2020-02-06 DIAGNOSIS — R5383 Other fatigue: Secondary | ICD-10-CM

## 2020-02-06 DIAGNOSIS — G2581 Restless legs syndrome: Secondary | ICD-10-CM

## 2020-02-06 DIAGNOSIS — Z79899 Other long term (current) drug therapy: Secondary | ICD-10-CM | POA: Diagnosis not present

## 2020-02-06 DIAGNOSIS — M47816 Spondylosis without myelopathy or radiculopathy, lumbar region: Secondary | ICD-10-CM

## 2020-02-06 DIAGNOSIS — L4050 Arthropathic psoriasis, unspecified: Secondary | ICD-10-CM

## 2020-02-06 DIAGNOSIS — Z8669 Personal history of other diseases of the nervous system and sense organs: Secondary | ICD-10-CM

## 2020-02-06 DIAGNOSIS — M5030 Other cervical disc degeneration, unspecified cervical region: Secondary | ICD-10-CM

## 2020-02-06 DIAGNOSIS — Z8639 Personal history of other endocrine, nutritional and metabolic disease: Secondary | ICD-10-CM

## 2020-02-06 DIAGNOSIS — Z8659 Personal history of other mental and behavioral disorders: Secondary | ICD-10-CM

## 2020-02-06 DIAGNOSIS — F5101 Primary insomnia: Secondary | ICD-10-CM

## 2020-02-06 DIAGNOSIS — M5134 Other intervertebral disc degeneration, thoracic region: Secondary | ICD-10-CM

## 2020-02-06 MED ORDER — OXYCODONE-ACETAMINOPHEN 5-325 MG PO TABS: 1.0000 | ORAL_TABLET | Freq: Every day | ORAL | 0 refills | Status: DC

## 2020-02-06 MED ORDER — LEFLUNOMIDE 20 MG PO TABS: 20.0000 mg | ORAL_TABLET | Freq: Every day | ORAL | 2 refills | Status: DC

## 2020-02-06 NOTE — Telephone Encounter (Signed)
Patient left message stating she needs a refill on oxycodone.

## 2020-02-06 NOTE — Patient Instructions (Addendum)
Standing Labs We placed an order today for your standing lab work.   Please have your standing labs drawn in 2 weeks, then in 2 months and then every 3 months.   If possible, please have your labs drawn 2 weeks prior to your appointment so that the provider can discuss your results at your appointment.  We have open lab daily Monday through Thursday from 8:30-12:30 PM and 1:30-4:30 PM and Friday from 8:30-12:30 PM and 1:30-4:00 PM at the office of Dr. Bo Merino, [facility].   Please be advised, patients with office appointments requiring lab work will take precedents over walk-in lab work.  If possible, please come for your lab work on Monday and Friday afternoons, as you may experience shorter wait times. The office is located at [address], [place], [place], [place] [address] No appointment is necessary.   Labs are drawn by [facility]. Please bring your co-pay at the time of your lab draw.  You may receive a bill from [facility] for your lab work.  If you wish to have your labs drawn at another location, please call the office 24 hours in advance to send orders.  If you have any questions regarding directions or hours of operation,  please call [phone].   As a reminder, please drink plenty of water prior to coming for your lab work. Thanks!   Leflunomide tablets What is this medicine? LEFLUNOMIDE (le FLOO na mide) is for rheumatoid arthritis. This medicine may be used for other purposes; ask your health care provider or pharmacist if you have questions. COMMON BRAND NAME(S): Arava What should I tell my health care provider before I take this medicine? They need to know if you have any of these conditions:  diabetes  have a fever or infection  high blood pressure  immune system problems  kidney disease  liver disease  low blood cell counts, like low white cell, platelet, or red cell counts  lung or breathing disease, like asthma  recently received  or scheduled to receive a vaccine  receiving treatment for cancer  skin conditions or sensitivity  tingling of the fingers or toes, or other nerve disorder  tuberculosis  an unusual or allergic reaction to leflunomide, teriflunomide, other medicines, food, dyes, or preservatives  pregnant or trying to get pregnant  breast-feeding How should I use this medicine? Take this medicine by mouth with a full glass of water. Follow the directions on the prescription label. Take your medicine at regular intervals. Do not take your medicine more often than directed. Do not stop taking except on your doctor's advice. Talk to your pediatrician regarding the use of this medicine in children. Special care may be needed. Overdosage: If you think you have taken too much of this medicine contact a poison control center or emergency room at once. NOTE: This medicine is only for you. Do not share this medicine with others. What if I miss a dose? If you miss a dose, take it as soon as you can. If it is almost time for your next dose, take only that dose. Do not take double or extra doses. What may interact with this medicine? Do not take this medicine with any of the following medications:  teriflunomide This medicine may also interact with the following medications:  alosetron  birth control pills  caffeine  cefaclor  certain medicines for diabetes like nateglinide, repaglinide, rosiglitazone, pioglitazone  certain medicines for high cholesterol like atorvastatin, pravastatin, rosuvastatin, simvastatin  charcoal  cholestyramine  ciprofloxacin  duloxetine  furosemide  ketoprofen  live virus vaccines  medicines that increase your risk for infection  methotrexate  mitoxantrone  paclitaxel  penicillin  theophylline  tizanidine  warfarin This list may not describe all possible interactions. Give your health care provider a list of all the medicines, herbs, non-prescription  drugs, or dietary supplements you use. Also tell them if you smoke, drink alcohol, or use illegal drugs. Some items may interact with your medicine. What should I watch for while using this medicine? Visit your health care provider for regular checks on your progress. Tell your doctor or health care provider if your symptoms do not start to get better or if they get worse. You may need blood work done while you are taking this medicine. This medicine may cause serious skin reactions. They can happen weeks to months after starting the medicine. Contact your health care provider right away if you notice fevers or flu-like symptoms with a rash. The rash may be red or purple and then turn into blisters or peeling of the skin. Or, you might notice a red rash with swelling of the face, lips or lymph nodes in your neck or under your arms. This medicine may stay in your body for up to 2 years after your last dose. Tell your doctor about any unusual side effects or symptoms. A medicine can be given to help lower your blood levels of this medicine more quickly. Women must use effective birth control with this medicine. There is a potential for serious side effects to an unborn child. Do not become pregnant while taking this medicine. Inform your doctor if you wish to become pregnant. This medicine remains in your blood after you stop taking it. You must continue using effective birth control until the blood levels have been checked and they are low enough. A medicine can be given to help lower your blood levels of this medicine more quickly. Immediately talk to your doctor if you think you may be pregnant. You may need a pregnancy test. Talk to your health care provider or pharmacist for more information. You should not receive certain vaccines during your treatment and for a certain time after your treatment with this medication ends. Talk to your health care provider for more information. What side effects may I  notice from receiving this medicine? Side effects that you should report to your doctor or health care professional as soon as possible:  allergic reactions like skin rash, itching or hives, swelling of the face, lips, or tongue  breathing problems  cough  increased blood pressure  low blood counts - this medicine may decrease the number of white blood cells and platelets. You may be at increased risk for infections and bleeding.  pain, tingling, numbness in the hands or feet  rash, fever, and swollen lymph nodes  redness, blistering, peeing or loosening of the skin, including inside the mouth  signs of decreased platelets or bleeding - bruising, pinpoint red spots on the skin, black, tarry stools, blood in urine  signs of infection - fever or chills, cough, sore throat, pain or trouble passing urine  signs and symptoms of liver injury like dark yellow or brown urine; general ill feeling or flu-like symptoms; light-colored stools; loss of appetite; nausea; right upper belly pain; unusually weak or tired; yellowing of the eyes or skin  trouble passing urine or change in the amount of urine  vomiting Side effects that usually do not require medical  attention (report to your doctor or health care professional if they continue or are bothersome):  diarrhea  hair thinning or loss  headache  nausea  tiredness This list may not describe all possible side effects. Call your doctor for medical advice about side effects. You may report side effects to FDA at 1-800-FDA-1088. Where should I keep my medicine? Keep out of the reach of children. Store at room temperature between 15 and 30 degrees C (59 and 86 degrees F). Protect from moisture and light. Throw away any unused medicine after the expiration date. NOTE: This sheet is a summary. It may not cover all possible information. If you have questions about this medicine, talk to your doctor, pharmacist, or health care provider.   2020 Elsevier/Gold Standard (2018-08-12 15:06:48)  COVID-19 vaccine recommendations:   COVID-19 vaccine is recommended for everyone (unless you are allergic to a vaccine component), even if you are on a medication that suppresses your immune system.   If you are on Methotrexate, Cellcept (mycophenolate), Rinvoq, Morrie Sheldon, and Olumiant- hold the medication for 1 week after each vaccine. Hold Methotrexate for 2 weeks after the single dose COVID-19 vaccine.   If you are on Orencia subcutaneous injection - hold medication one week prior to and one week after the first COVID-19 vaccine dose (only).   If you are on Orencia IV infusions- time vaccination administration so that the first COVID-19 vaccination will occur four weeks after the infusion and postpone the subsequent infusion by one week.   If you are on Cyclophosphamide or Rituxan infusions please contact your doctor prior to receiving the COVID-19 vaccine.   Do not take Tylenol or any anti-inflammatory medications (NSAIDs) 24 hours prior to the COVID-19 vaccination.   There is no direct evidence about the efficacy of the COVID-19 vaccine in individuals who are on medications that suppress the immune system.   Even if you are fully vaccinated, and you are on any medications that suppress your immune system, please continue to wear a mask, maintain at least six feet social distance and practice hand hygiene.   If you develop a COVID-19 infection, please contact your PCP or our office to determine if you need antibody infusion.  The booster vaccine is now available for immunocompromised patients. It is advised that if you had Pfizer vaccine you should get Coca-Cola booster.  If you had a Moderna vaccine then you should get a Moderna booster. Johnson and [name] does not have a booster vaccine at this time.  Please see the following web sites for updated information.    https://[email or web address]/[email or web address]-Vaccination-Patient-Resources.[email or web address]  [email or web address]://[email or web address]/About-Us/[email or web address]-Releases/ID/1159 Leflunomide tablets What is this medicine? LEFLUNOMIDE (le FLOO na mide) is for rheumatoid arthritis. This medicine may be used for other purposes; ask your health care provider or pharmacist if you have questions. COMMON BRAND NAME(S): Arava What should I tell my health care provider before I take this medicine? They need to know if you have any of these conditions:  diabetes  have a fever or infection  high blood pressure  immune system problems  kidney disease  liver disease  low blood cell counts, like low white cell, platelet, or red cell counts  lung or breathing disease, like asthma  recently received or scheduled to receive a vaccine  receiving treatment for cancer  skin conditions or sensitivity  tingling of the fingers or toes, or other nerve disorder  tuberculosis  an unusual or allergic reaction to leflunomide, teriflunomide, other medicines, food, dyes, or preservatives  pregnant or trying  to get pregnant  breast-feeding How should I use this medicine? Take this medicine by mouth with a full glass of water. Follow the directions on the prescription label. Take your medicine at regular intervals. Do not take your medicine more often than directed. Do not stop taking except on your doctor's advice. Talk to your pediatrician regarding the use of this medicine in children. Special care may be needed. Overdosage: If you think you have taken too much of this medicine contact a poison control center or emergency room at once. NOTE: This medicine is only for you. Do not share this medicine with others. What if I miss a dose? If you miss a dose, take it as soon as you can. If it is almost time for your next dose, take only that dose. Do not take double or extra doses. What may interact with this  medicine? Do not take this medicine with any of the following medications:  teriflunomide This medicine may also interact with the following medications:  alosetron  birth control pills  caffeine  cefaclor  certain medicines for diabetes like nateglinide, repaglinide, rosiglitazone, pioglitazone  certain medicines for high cholesterol like atorvastatin, pravastatin, rosuvastatin, simvastatin  charcoal  cholestyramine  ciprofloxacin  duloxetine  furosemide  ketoprofen  live virus vaccines  medicines that increase your risk for infection  methotrexate  mitoxantrone  paclitaxel  penicillin  theophylline  tizanidine  warfarin This list may not describe all possible interactions. Give your health care provider a list of all the medicines, herbs, non-prescription drugs, or dietary supplements you use. Also tell them if you smoke, drink alcohol, or use illegal drugs. Some items may interact with your medicine. What should I watch for while using this medicine? Visit your health care provider for regular checks on your progress. Tell your doctor or health care provider if your symptoms do not start to get better or if they get worse. You may need blood work done while you are taking this medicine. This medicine may cause serious skin reactions. They can happen weeks to months after starting the medicine. Contact your health care provider right away if you notice fevers or flu-like symptoms with a rash. The rash may be red or purple and then turn into blisters or peeling of the skin. Or, you might notice a red rash with swelling of the face, lips or lymph nodes in your neck or under your arms. This medicine may stay in your body for up to 2 years after your last dose. Tell your doctor about any unusual side effects or symptoms. A medicine can be given to help lower your blood levels of this medicine more quickly. Women must use effective birth control with this medicine.  There is a potential for serious side effects to an unborn child. Do not become pregnant while taking this medicine. Inform your doctor if you wish to become pregnant. This medicine remains in your blood after you stop taking it. You must continue using effective birth control until the blood levels have been checked and they are low enough. A medicine can be given to help lower your blood levels of this medicine more quickly. Immediately talk to your doctor if you think you may be pregnant. You may need a pregnancy test. Talk to your health care provider or pharmacist for more information. You should not receive certain vaccines during your treatment and for a certain time after your treatment with this medication ends. Talk to your health care provider for more  information. What side effects may I notice from receiving this medicine? Side effects that you should report to your doctor or health care professional as soon as possible:  allergic reactions like skin rash, itching or hives, swelling of the face, lips, or tongue  breathing problems  cough  increased blood pressure  low blood counts - this medicine may decrease the number of white blood cells and platelets. You may be at increased risk for infections and bleeding.  pain, tingling, numbness in the hands or feet  rash, fever, and swollen lymph nodes  redness, blistering, peeing or loosening of the skin, including inside the mouth  signs of decreased platelets or bleeding - bruising, pinpoint red spots on the skin, black, tarry stools, blood in urine  signs of infection - fever or chills, cough, sore throat, pain or trouble passing urine  signs and symptoms of liver injury like dark yellow or brown urine; general ill feeling or flu-like symptoms; light-colored stools; loss of appetite; nausea; right upper belly pain; unusually weak or tired; yellowing of the eyes or skin  trouble passing urine or change in the amount of  urine  vomiting Side effects that usually do not require medical attention (report to your doctor or health care professional if they continue or are bothersome):  diarrhea  hair thinning or loss  headache  nausea  tiredness This list may not describe all possible side effects. Call your doctor for medical advice about side effects. You may report side effects to FDA at 1-800-FDA-1088. Where should I keep my medicine? Keep out of the reach of children. Store at room temperature between 15 and 30 degrees C (59 and 86 degrees F). Protect from moisture and light. Throw away any unused medicine after the expiration date. NOTE: This sheet is a summary. It may not cover all possible information. If you have questions about this medicine, talk to your doctor, pharmacist, or health care provider.  2020 Elsevier/Gold Standard (2018-08-12 15:06:48)  Standing Labs We placed an order today for your standing lab work.   Please have your standing labs drawn in 2 weeks, 2 months and then every 3 months  If possible, please have your labs drawn 2 weeks prior to your appointment so that the provider can discuss your results at your appointment.  We have open lab daily Monday through Thursday from 8:30-12:30 PM and 1:30-4:30 PM and Friday from 8:30-12:30 PM and 1:30-4:00 PM at the office of Dr. Bo Merino, [facility].   Please be advised, patients with office appointments requiring lab work will take precedents over walk-in lab work.  If possible, please come for your lab work on Monday and Friday afternoons, as you may experience shorter wait times. The office is located at [address], [place], [place], [place] [address] No appointment is necessary.   Labs are drawn by [facility]. Please bring your co-pay at the time of your lab draw.  You may receive a bill from [facility] for your lab work.  If you wish to have your labs drawn at another location, please call the office 24  hours in advance to send orders.  If you have any questions regarding directions or hours of operation,  please call [phone].   As a reminder, please drink plenty of water prior to coming for your lab work. Thanks!

## 2020-02-07 ENCOUNTER — Telehealth: Payer: Self-pay | Admitting: Rheumatology

## 2020-02-07 NOTE — Telephone Encounter (Signed)
Spoke with patient's husband who is on dpr and advised we sent in the [place] as requested for patient on 02/06/2020. Advised she had another doctor who sent in the Oxycodone.

## 2020-02-07 NOTE — Telephone Encounter (Signed)
Patient left a message stating on her AVS a prescription for Oxycodone was sent into the pharmacy. Patient states she did not need that medication. Please send in [place] to [facility] on [address].

## 2020-02-12 ENCOUNTER — Other Ambulatory Visit: Payer: Self-pay | Admitting: Rheumatology

## 2020-02-12 NOTE — Telephone Encounter (Signed)
Last Visit: 02/06/2020 Next Visit: Due around 04/07/2020  Labs: 10/28/2019  CBC w/ no abnormal values CMP abnormal values: Glucose: 146, Total Bilirubin 0.2 02/06/2020 office note: I reviewed her labs from June 2021 which was stable  Current Dose per office note 02/06/2020: 20 mg p.o. daily DX: Psoriatic arthritis   Okay to refill Arava?

## 2020-02-19 ENCOUNTER — Ambulatory Visit (HOSPITAL_COMMUNITY)
Admission: RE | Admit: 2020-02-19 | Discharge: 2020-02-19 | Disposition: A | Discharge status: Home / Self Care | Payer: Medicare Other | Source: Ambulatory Visit | Attending: Family Medicine | Admitting: Family Medicine

## 2020-02-19 ENCOUNTER — Ambulatory Visit (INDEPENDENT_AMBULATORY_CARE_PROVIDER_SITE_OTHER): Payer: Medicare Other | Admitting: Family Medicine

## 2020-02-19 ENCOUNTER — Other Ambulatory Visit: Payer: Self-pay

## 2020-02-19 VITALS — BP 130/80 | HR 83 | Temp 97.4°F | Ht 64.0 in | Wt 202.0 lb

## 2020-02-19 DIAGNOSIS — R1084 Generalized abdominal pain: Secondary | ICD-10-CM | POA: Diagnosis present

## 2020-02-19 DIAGNOSIS — R7309 Other abnormal glucose: Secondary | ICD-10-CM

## 2020-02-19 NOTE — Progress Notes (Signed)
Subjective:    Patient ID: Janet Peterson, female    DOB: 12-09-1950, 69 y.o.   MRN: [id number]  HPI  6/21 Patient was recently admitted to the hospital with suicidal ideation.  She has since been discharged from behavioral health and requested a refill on her oxycodone.  However I wanted to see the patient first prior to refilling a controlled substance.  She is here today with her husband.  They both endorse that she is doing much better.  At the time, she was thinking about her son who has died.  She was having thoughts of wanting to overdose on her medication.  She states that she is no longer having those thoughts.  She is in a much better place.  Her psychiatrist increased her dose of fluvoxamine.  She is using Percocet at night to help her sleep and occasionally during the daytime if she walks a long distance.  She was started on vitamin D for vitamin D deficiency while in the hospital.  Vitamin D level was in the 20s.  At that time, my plan was: Patient states that she is doing better.  I am no longer concerned that she is actively suicidal.  I feel comfortable refilling her Percocet that she has been taking for quite some time.  Both she and her husband state that she is doing better with no concerns.  Therefore I will refill her Percocet, 60 tablets/month.  I will also refill the Roselyn Meier that she is taking as needed for migraines.  I recommended vitamin D 50,000 units weekly for 6 months and then recheck vitamin D level.  Also recommended that she decrease her Prilosec to 40 mg a day and if tolerating this in the future could try switching to Pepcid.  12/14/19 Blood pressure today is well controlled at 110/70. Patient is going on a 1 month vacation across the [place]. However her prescription will expire before she returns home from her vacation. She takes 1 Percocet a day due to pain in her joints and in her neck due to the psoriatic arthritis. She is asking that I refill her  prescription earlier so that she can take it with her. I would be happy to do that. She shows no evidence of abuse or diversion. She is also requesting a heel lift for her right shoe. 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 She benefits from a shoe lift on the right foot to help correct this which helps with hip pain and lower back pain   02/19/20 Patient states her symptoms began approximately 15 days ago.  She was driving down the road and suddenly became extremely nauseated.  Her husband had to pull over and she vomited approximately 6 times.  This occurred suddenly and without warning.  She then felt better however a few days later she started developing dark green stool.  She states that she was also having left-sided abdominal pain.  The pain is sharp.  Tends to be located on the left side.  She denies any dysuria or urgency or frequency.  She denies any hematuria.  She denies any melena or hematochezia.  She denies any reflux-like symptoms.  She denies any heartburn.  She does report constipation.  In fact the first week that the nausea and pain was occurring, the patient states that she was constipated and eventually had a taken Ex-Lax.  She now states that she is  having a bowel movement every day however it has changed in color and is green.  Furthermore she reports nausea as soon as she eats. Past Medical History:  Diagnosis Date  . Ankylosing spondylitis ([place])   . Anxiety and depression   . Depression   . Fibromyalgia   . GERD (gastroesophageal reflux disease)   . History of basal cell carcinoma excision    FACE, [place]  . History of hiatal hernia   . History of thrush   . Hyperlipidemia   . Hypertension   . Insomnia   . Left breast mass   . OCD (obsessive compulsive disorder)   . OSA (obstructive sleep apnea)    MILD AND NO CPAP SINCE SURGERY IN 2007  . Osteopenia   . PONV (postoperative nausea and vomiting)   .  Psoriatic arthritis ([place])   . PVC (premature ventricular contraction)   . Rheumatoid arthritis [facility])    Past Surgical History:  Procedure Laterality Date  . BIOPSY  09/29/2019   Procedure: BIOPSY;  Surgeon: Ronnette Juniper, MD;  Location: [facility];  Service: Gastroenterology;;  . BREAST LUMPECTOMY WITH RADIOACTIVE SEED LOCALIZATION Left 05/04/2018   Procedure: LEFT BREAST LUMPECTOMY WITH RADIOACTIVE SEED LOCALIZATION AND LEFT BREAST NIPPLE BIOPSY;  Surgeon: Jovita Kussmaul, MD;  Location: [facility];  Service: General;  Laterality: Left;  . BREAST SURGERY  1975   lumpectomy-- benign  . BUNIONECTOMY  1991  . CATARACT EXTRACTION W/ INTRAOCULAR LENS  IMPLANT, BILATERAL  2000  . CERVICAL FUSION  March 2013   C5 -- C7  . CESAREAN SECTION  1985  . COLONOSCOPY WITH PROPOFOL N/A 09/29/2019   Procedure: COLONOSCOPY WITH PROPOFOL;  Surgeon: Ronnette Juniper, MD;  Location: [facility];  Service: Gastroenterology;  Laterality: N/A;  . DISTAL INTERPHALANGEAL JOINT FUSION Right 03/14/2015   Procedure: RIGHT LONG FINGER DISTAL INTERPHALANGEAL JOINT ARTHRODESIS;  Surgeon: Iran Planas, MD;  Location: [facility];  Service: Orthopedics;  Laterality: Right;  . ESOPHAGOGASTRODUODENOSCOPY (EGD) WITH PROPOFOL N/A 09/29/2019   Procedure: ESOPHAGOGASTRODUODENOSCOPY (EGD) WITH PROPOFOL;  Surgeon: Ronnette Juniper, MD;  Location: [facility];  Service: Gastroenterology;  Laterality: N/A;  . EYE SURGERY  1995   rk (laser surgery), semi cornea transplant, detacted retina,  fluid removal  . KNEE ARTHROSCOPY Left 03-03-2004  . POLYPECTOMY  09/29/2019   Procedure: POLYPECTOMY;  Surgeon: Ronnette Juniper, MD;  Location: [facility];  Service: Gastroenterology;;  . POSTERIOR VITRECTOMY RIGHT EYE AND LASER   10-27-1999  . SHOULDER SURGERY Right 1997  . SPINAL FIXATION SURGERY W/ IMPLANT  2013 rod #1//  2014  rod 2   S1 -- T10  (rod #1)//   S1 -- T4 (rod #2)  . THORACIC FUSION  03-13-2013   REMOVAL  HARDWARE/  BONE GRAFT FUSION T10//  REVISION OF RODS  . TOTAL KNEE ARTHROPLASTY Left 12-14-2005  . UVULOPALATOPHARYNGOPLASTY  04-26-2006   w/  TONSILLECTOMY/  TURBINATE REDUCTIONS/  BILATERAL ANTERIOR ETHMOIDECTOMY   Current Outpatient Medications on File Prior to Visit  Medication Sig Dispense Refill  . buPROPion (WELLBUTRIN XL) 300 MG 24 hr tablet Take 300 mg by mouth every morning.  1  . clonazePAM (KLONOPIN) 1 MG tablet TAKE 1 TABLET BY MOUTH EVERY DAY AS NEEDED FOR ANXIETY 30 tablet 1  . ezetimibe (ZETIA) 10 MG tablet TAKE 1 TABLET BY MOUTH EVERY DAY (Patient taking differently: Take 10 mg by mouth daily after breakfast. ) 90 tablet 3  . fluticasone (FLONASE) 50  MCG/ACT nasal spray Place 2 sprays into both nostrils daily as needed for allergies.     . fluvoxaMINE (LUVOX) 100 MG tablet Take 125 mg by mouth in the morning, at noon, and at bedtime.     Marland Kitchen leflunomide (ARAVA) 20 MG tablet TAKE 1 TABLET BY MOUTH EVERY DAY 90 tablet 0  . metoprolol succinate (TOPROL-XL) 50 MG 24 hr tablet TAKE 1 TABLET BY MOUTH ONCE DAILY FOLLOWING A MEAL 90 tablet 3  . naproxen sodium (ALEVE) 220 MG tablet Take 440 mg by mouth 2 (two) times daily as needed (pain).    Marland Kitchen omeprazole (PRILOSEC) 20 MG capsule Take 20 mg by mouth daily.    Marland Kitchen oxyCODONE-acetaminophen (PERCOCET) 5-325 MG tablet Take 1 tablet by mouth at bedtime. 60 tablet 0  . Probiotic Product (PROBIOTIC PO) Take 1 tablet by mouth daily after breakfast.    . tiZANidine (ZANAFLEX) 4 MG tablet TAKE 1 TABLET (4 MG TOTAL) BY MOUTH EVERY 6 (SIX) HOURS AS NEEDED FOR MUSCLE SPASMS. 30 tablet 0  . valsartan (DIOVAN) 320 MG tablet TAKE 1 TABLET BY MOUTH EVERY DAY 90 tablet 3  . Vitamin D, Ergocalciferol, (DRISDOL) 1.25 MG (50000 UNIT) CAPS capsule Take 1 capsule (50,000 Units total) by mouth every 7 (seven) days. 5 capsule 5   No current facility-administered medications on file prior to visit.   Allergies  Allergen Reactions  . Latex Rash    And Mouth  sores  . Amlodipine Besy-Benazepril Hcl Other (See Comments)    COUGH  . Augmentin [Amoxicillin-Pot Clavulanate] Diarrhea and Nausea And Vomiting  . Bextra [Valdecoxib] Diarrhea and Nausea And Vomiting    REFLUX  . Chlorhexidine     rash  . Lipitor [Atorvastatin Calcium] Other (See Comments)    MYALGIAS  . Sulfa Antibiotics Nausea And Vomiting    CRAMPING  . Zocor [Simvastatin] Other (See Comments)    MYALGIA   Social History   Socioeconomic History  . Marital status: Married    Spouse name: Not on file  . Number of children: Not on file  . Years of education: Not on file  . Highest education level: Not on file  Occupational History  . Not on file  Tobacco Use  . Smoking status: Former Smoker    Packs/day: 0.50    Years: 10.00    Pack years: 5.00    Types: Cigarettes    Quit date: 03/07/1995    Years since quitting: 24.9  . Smokeless tobacco: Never Used  Vaping Use  . Vaping Use: Never used  Substance and Sexual Activity  . Alcohol use: Yes    Comment: rare  . Drug use: Never  . Sexual activity: Not on file  Other Topics Concern  . Not on file  Social History Narrative  . Not on file   Social Determinants of Health   Financial Resource Strain:   . Difficulty of Paying Living Expenses: Not on file  Food Insecurity:   . Worried About Charity fundraiser in the Last Year: Not on file  . Ran Out of Food in the Last Year: Not on file  Transportation Needs:   . Lack of Transportation (Medical): Not on file  . Lack of Transportation (Non-Medical): Not on file  Physical Activity:   . Days of Exercise per Week: Not on file  . Minutes of Exercise per Session: Not on file  Stress:   . Feeling of Stress : Not on file  Social Connections:   .  Frequency of Communication with Friends and Family: Not on file  . Frequency of Social Gatherings with Friends and Family: Not on file  . Attends Religious Services: Not on file  . Active Member of Clubs or Organizations: Not  on file  . Attends Archivist Meetings: Not on file  . Marital Status: Not on file  Intimate Partner Violence:   . Fear of Current or Ex-Partner: Not on file  . Emotionally Abused: Not on file  . Physically Abused: Not on file  . Sexually Abused: Not on file    Review of Systems  All other systems reviewed and are negative.      Objective:   Physical Exam Vitals reviewed.  Constitutional:      General: She is not in acute distress.    Appearance: Normal appearance. She is normal weight. She is not ill-appearing or toxic-appearing.  Cardiovascular:     Rate and Rhythm: Normal rate and regular rhythm.     Heart sounds: Normal heart sounds. No murmur heard.  No friction rub. No gallop.   Pulmonary:     Effort: Pulmonary effort is normal. No respiratory distress.     Breath sounds: Normal breath sounds. No wheezing, rhonchi or rales.  Abdominal:     General: Abdomen is flat. Bowel sounds are decreased.     Palpations: Abdomen is soft.     Tenderness: There is generalized abdominal tenderness and tenderness in the periumbilical area and left lower quadrant.  Musculoskeletal:     Right lower leg: No edema.     Left lower leg: No edema.  Neurological:     Mental Status: She is alert.           Assessment & Plan:  Generalized abdominal pain - Plan: DG Abd 2 Views, CBC with Differential/Platelet, COMPLETE METABOLIC PANEL WITH GFR  I believe the patient's pain is likely due to constipation.  I believe that she may be so constipated that is distending her intestines causing pain and contributing to nausea.  I will obtain an x-ray to evaluate further to confirm my suspicions.  Meanwhile check a CBC to look for leukocytosis along with a CMP.  Other potential causes would be diverticulitis given the location of the pain.  If there is an elevated white blood cell count or the pain intensifies or if the x-ray shows no constipation, I will treat the patient empirically for  possible diverticulitis.  Begin Linzess 145 mcg daily

## 2020-02-20 NOTE — Addendum Note (Signed)
Addended by: Saundra Shelling on: 02/20/2020 03:08 PM   Modules accepted: Orders

## 2020-02-22 LAB — CBC WITH DIFFERENTIAL/PLATELET
Absolute Monocytes: 437 cells/uL (ref 200–950)
Basophils Absolute: 67 cells/uL (ref 0–200)
Basophils Relative: 1.2 %
Eosinophils Absolute: 67 cells/uL (ref 15–500)
Eosinophils Relative: 1.2 %
HCT: 39.8 % (ref 35.0–45.0)
Hemoglobin: 13 g/dL (ref 11.7–15.5)
Lymphs Abs: 1590 cells/uL (ref 850–3900)
MCH: 31 pg (ref 27.0–33.0)
MCHC: 32.7 g/dL (ref 32.0–36.0)
MCV: 94.8 fL (ref 80.0–100.0)
MPV: 11.7 fL (ref 7.5–12.5)
Monocytes Relative: 7.8 %
Neutro Abs: 3438 cells/uL (ref 1500–7800)
Neutrophils Relative %: 61.4 %
Platelets: 198 10*3/uL (ref 140–400)
RBC: 4.2 10*6/uL (ref 3.80–5.10)
RDW: 12.6 % (ref 11.0–15.0)
Total Lymphocyte: 28.4 %
WBC: 5.6 10*3/uL (ref 3.8–10.8)

## 2020-02-22 LAB — TEST AUTHORIZATION

## 2020-02-22 LAB — COMPLETE METABOLIC PANEL WITH GFR
AG Ratio: 1.7 (calc) (ref 1.0–2.5)
ALT: 4 U/L — ABNORMAL LOW (ref 6–29)
AST: 11 U/L (ref 10–35)
Albumin: 4 g/dL (ref 3.6–5.1)
Alkaline phosphatase (APISO): 91 U/L (ref 37–153)
BUN: 9 mg/dL (ref 7–25)
CO2: 30 mmol/L (ref 20–32)
Calcium: 10.1 mg/dL (ref 8.6–10.4)
Chloride: 101 mmol/L (ref 98–110)
Creat: 0.73 mg/dL (ref 0.50–0.99)
GFR, Est African American: 97 mL/min/{1.73_m2} (ref >=60)
GFR, Est Non African American: 84 mL/min/{1.73_m2} (ref >=60)
Globulin: 2.4 g/dL (calc) (ref 1.9–3.7)
Glucose, Bld: 168 mg/dL — ABNORMAL HIGH (ref 65–99)
Potassium: 4.2 mmol/L (ref 3.5–5.3)
Sodium: 138 mmol/L (ref 135–146)
Total Bilirubin: 0.3 mg/dL (ref 0.2–1.2)
Total Protein: 6.4 g/dL (ref 6.1–8.1)

## 2020-02-22 LAB — HEMOGLOBIN A1C W/OUT EAG: Hgb A1c MFr Bld: 6.1 % of total Hgb — ABNORMAL HIGH (ref <5.7)

## 2020-02-24 ENCOUNTER — Emergency Department (HOSPITAL_COMMUNITY): Payer: Medicare Other

## 2020-02-24 ENCOUNTER — Other Ambulatory Visit: Payer: Self-pay

## 2020-02-24 ENCOUNTER — Emergency Department (HOSPITAL_COMMUNITY)
Admission: EM | Admit: 2020-02-24 | Discharge: 2020-02-25 | Disposition: A | Discharge status: Home / Self Care | Payer: Medicare Other | Attending: Emergency Medicine | Admitting: Emergency Medicine

## 2020-02-24 ENCOUNTER — Encounter (HOSPITAL_COMMUNITY): Payer: Self-pay

## 2020-02-24 DIAGNOSIS — Z87891 Personal history of nicotine dependence: Secondary | ICD-10-CM | POA: Diagnosis not present

## 2020-02-24 DIAGNOSIS — R21 Rash and other nonspecific skin eruption: Secondary | ICD-10-CM | POA: Insufficient documentation

## 2020-02-24 DIAGNOSIS — Z91040 Latex allergy status: Secondary | ICD-10-CM | POA: Diagnosis not present

## 2020-02-24 DIAGNOSIS — Z20822 Contact with and (suspected) exposure to covid-19: Secondary | ICD-10-CM | POA: Insufficient documentation

## 2020-02-24 DIAGNOSIS — R10816 Epigastric abdominal tenderness: Secondary | ICD-10-CM | POA: Diagnosis not present

## 2020-02-24 DIAGNOSIS — R10812 Left upper quadrant abdominal tenderness: Secondary | ICD-10-CM | POA: Diagnosis not present

## 2020-02-24 DIAGNOSIS — R110 Nausea: Secondary | ICD-10-CM | POA: Diagnosis present

## 2020-02-24 DIAGNOSIS — R10811 Right upper quadrant abdominal tenderness: Secondary | ICD-10-CM | POA: Diagnosis not present

## 2020-02-24 DIAGNOSIS — R109 Unspecified abdominal pain: Secondary | ICD-10-CM

## 2020-02-24 DIAGNOSIS — I10 Essential (primary) hypertension: Secondary | ICD-10-CM | POA: Diagnosis not present

## 2020-02-24 DIAGNOSIS — Z79899 Other long term (current) drug therapy: Secondary | ICD-10-CM | POA: Insufficient documentation

## 2020-02-24 DIAGNOSIS — K219 Gastro-esophageal reflux disease without esophagitis: Secondary | ICD-10-CM | POA: Diagnosis not present

## 2020-02-24 DIAGNOSIS — Z96652 Presence of left artificial knee joint: Secondary | ICD-10-CM | POA: Diagnosis not present

## 2020-02-24 LAB — COMPREHENSIVE METABOLIC PANEL
ALT: 8 U/L (ref 0–44)
AST: 15 U/L (ref 15–41)
Albumin: 3.5 g/dL (ref 3.5–5.0)
Alkaline Phosphatase: 83 U/L (ref 38–126)
Anion gap: 12 (ref 5–15)
BUN: 10 mg/dL (ref 8–23)
CO2: 25 mmol/L (ref 22–32)
Calcium: 9.6 mg/dL (ref 8.9–10.3)
Chloride: 98 mmol/L (ref 98–111)
Creatinine, Ser: 0.74 mg/dL (ref 0.44–1.00)
GFR calc Af Amer: >60 mL/min (ref >60)
GFR calc non Af Amer: >60 mL/min (ref >60)
Glucose, Bld: 99 mg/dL (ref 70–99)
Potassium: 3.6 mmol/L (ref 3.5–5.1)
Sodium: 135 mmol/L (ref 135–145)
Total Bilirubin: 0.6 mg/dL (ref 0.3–1.2)
Total Protein: 6.8 g/dL (ref 6.5–8.1)

## 2020-02-24 LAB — CBC WITH DIFFERENTIAL/PLATELET
Abs Immature Granulocytes: 0.03 10*3/uL (ref 0.00–0.07)
Basophils Absolute: 0.1 10*3/uL (ref 0.0–0.1)
Basophils Relative: 1 %
Eosinophils Absolute: 0.1 10*3/uL (ref 0.0–0.5)
Eosinophils Relative: 1 %
HCT: 37.4 % (ref 36.0–46.0)
Hemoglobin: 12.7 g/dL (ref 12.0–15.0)
Immature Granulocytes: 0 %
Lymphocytes Relative: 29 %
Lymphs Abs: 2.1 10*3/uL (ref 0.7–4.0)
MCH: 31.8 pg (ref 26.0–34.0)
MCHC: 34 g/dL (ref 30.0–36.0)
MCV: 93.5 fL (ref 80.0–100.0)
Monocytes Absolute: 0.6 10*3/uL (ref 0.1–1.0)
Monocytes Relative: 9 %
Neutro Abs: 4.3 10*3/uL (ref 1.7–7.7)
Neutrophils Relative %: 60 %
Platelets: 210 10*3/uL (ref 150–400)
RBC: 4 MIL/uL (ref 3.87–5.11)
RDW: 13.2 % (ref 11.5–15.5)
WBC: 7.2 10*3/uL (ref 4.0–10.5)
nRBC: 0 % (ref 0.0–0.2)

## 2020-02-24 LAB — RESPIRATORY PANEL BY RT PCR (FLU A&B, COVID)
Influenza A by PCR: NEGATIVE
Influenza B by PCR: NEGATIVE
SARS Coronavirus 2 by RT PCR: NEGATIVE

## 2020-02-24 LAB — TROPONIN I (HIGH SENSITIVITY): Troponin I (High Sensitivity): 8 ng/L (ref <18)

## 2020-02-24 LAB — LIPASE, BLOOD: Lipase: 27 U/L (ref 11–51)

## 2020-02-24 MED ORDER — ONDANSETRON HCL 4 MG/2ML IJ SOLN
4.0000 mg | Freq: Once | INTRAMUSCULAR | Status: AC
  Administered 2020-02-24: 4 mg via INTRAVENOUS
  Filled 2020-02-24: qty 2

## 2020-02-24 MED ORDER — METOPROLOL SUCCINATE ER 25 MG PO TB24
50.0000 mg | ORAL_TABLET | Freq: Every day | ORAL | Status: DC
  Administered 2020-02-24: 50 mg via ORAL
  Filled 2020-02-24: qty 2

## 2020-02-24 MED ORDER — SODIUM CHLORIDE 0.9 % IV SOLN: INTRAVENOUS | Status: DC

## 2020-02-24 MED ORDER — IRBESARTAN 150 MG PO TABS: 150.0000 mg | ORAL_TABLET | Freq: Every day | ORAL | Status: DC

## 2020-02-24 MED ORDER — ALUM & MAG HYDROXIDE-SIMETH 200-200-20 MG/5ML PO SUSP
30.0000 mL | Freq: Once | ORAL | Status: AC
  Administered 2020-02-24: 30 mL via ORAL
  Filled 2020-02-24: qty 30

## 2020-02-24 MED ORDER — IRBESARTAN 150 MG PO TABS
150.0000 mg | ORAL_TABLET | Freq: Every day | ORAL | Status: DC
  Filled 2020-02-24 (×3): qty 1

## 2020-02-24 MED ORDER — LIDOCAINE VISCOUS HCL 2 % MT SOLN
15.0000 mL | Freq: Once | OROMUCOSAL | Status: AC
  Administered 2020-02-24: 15 mL via ORAL
  Filled 2020-02-24: qty 15

## 2020-02-24 MED ORDER — PANTOPRAZOLE SODIUM 40 MG IV SOLR
40.0000 mg | INTRAVENOUS | Status: AC
  Administered 2020-02-24: 40 mg via INTRAVENOUS
  Filled 2020-02-24: qty 40

## 2020-02-24 MED ORDER — IOHEXOL 300 MG/ML  SOLN
100.0000 mL | Freq: Once | INTRAMUSCULAR | Status: AC | PRN
  Administered 2020-02-24: 100 mL via INTRAVENOUS

## 2020-02-24 NOTE — ED Triage Notes (Signed)
Pt to er, pt states that she is here for sob, a rash, and some nausea.  Pt states that she has been camping coming back from [place].  States that she has had a sick feeling in her stomach for the past three weeks.  States that he went to her md on Monday and they did an x ray and she had some constipation.  States that she is now having normal bm.  Pt states that she has some chills and night sweats and still is having the sob.  Pt has a slight cough.

## 2020-02-24 NOTE — ED Provider Notes (Signed)
[facility] EMERGENCY DEPARTMENT Provider Note   CSN: [id number] Arrival date & time: 02/24/20  1810     History Chief Complaint  Patient presents with  . Nausea  . Rash  . Diarrhea    Janet Peterson is a 69 y.o. female.  HPI   this patient is a very pleasant 69 y/o female, who has several medical problems including hypertension, hyperlipidemia, rheumatoid arthritis as well as depression and anxiety.  The patient reports that approximately 3 weeks ago she started to develop some nausea, decreased appetite and some postprandial symptoms though not a tremendous amount of pain.  She has had very little to eat or drink over the last month because of the symptoms.  She does recall recently going to [place] on a road trip and coming back but does not recall any specific exposures and has not had any diarrhea in fact she has had more constipation.  She saw her family doctor a couple of days ago, an abdominal x-ray was ordered as well as some lab work.   Lab work showed no leukocytosis, no anemia, normal liver function tests, bilirubin and electrolytes as well as kidney function.  Her hemoglobin A1c was just elevated at 6.1  The patient does report that she gets some subjective fevers and chills and does not know why but denies any diarrhea, urinary symptoms, or coughing.  She has felt short of breath for some time and does not know why.  The patient was seen in June 2021 by her family doctor, she had recently been admitted to the hospital with suicidal ideations, since going home she states that she uses Percocet at night to help her sleep and sometimes during the day, her dose of fluvoxamine was recently increased.  This was back in June  In July it was noted that her blood pressure was under good control at 110/70.  In September on the 27th several days ago she had complained of a couple weeks of symptoms, mostly nausea that would come on without warning and rather acutely.  And had some  dark green stools.  Some left-sided abdominal pain, the patient denies that she is currently having abdominal pain.  Infection was worried about constipation and her family doctor prescribed Linzess which she has been taking this week successfully.  She did not take it today.  The x-ray that she had several days ago showed a large amount of stool noted throughout the colon, possible constipation no other acute findings.  Past Medical History:  Diagnosis Date  . Ankylosing spondylitis ([place])   . Anxiety and depression   . Depression   . Fibromyalgia   . GERD (gastroesophageal reflux disease)   . History of basal cell carcinoma excision    FACE, [place]  . History of hiatal hernia   . History of thrush   . Hyperlipidemia   . Hypertension   . Insomnia   . Left breast mass   . OCD (obsessive compulsive disorder)   . OSA (obstructive sleep apnea)    MILD AND NO CPAP SINCE SURGERY IN 2007  . Osteopenia   . PONV (postoperative nausea and vomiting)   . Psoriatic arthritis ([place])   . PVC (premature ventricular contraction)   . Rheumatoid arthritis [facility])     Patient Active Problem List   Diagnosis Date Noted  . C. difficile colitis 09/29/2019  . Colitis 09/28/2019  . Anxiety and depression 09/28/2019  . DDD (degenerative disc disease), cervical s/p  fusion 11/12/2016  . H/O total knee replacement, left 05/06/2016  . DDD lumbar spine status post fusion 05/06/2016  . Psoriasis 05/05/2016  . High risk medication use 05/05/2016  . Cervical post-laminectomy syndrome 12/09/2015  . Thoracic postlaminectomy syndrome 12/09/2015  . Psoriatic arthritis ([place]) 08/23/2012  . Osteopenia 08/23/2012  . HLD (hyperlipidemia) 08/23/2012  . RLS (restless legs syndrome) 08/23/2012  . Insomnia 08/23/2012  . Fibromyalgia syndrome 08/12/2012  . Low back pain 08/12/2012  . Syncope   . PVC (premature ventricular contraction)   . OCD (obsessive compulsive disorder)   . GERD (gastroesophageal reflux  disease)   . Hypertension     Past Surgical History:  Procedure Laterality Date  . BIOPSY  09/29/2019   Procedure: BIOPSY;  Surgeon: Ronnette Juniper, MD;  Location: [facility];  Service: Gastroenterology;;  . BREAST LUMPECTOMY WITH RADIOACTIVE SEED LOCALIZATION Left 05/04/2018   Procedure: LEFT BREAST LUMPECTOMY WITH RADIOACTIVE SEED LOCALIZATION AND LEFT BREAST NIPPLE BIOPSY;  Surgeon: Jovita Kussmaul, MD;  Location: [place];  Service: General;  Laterality: Left;  . BREAST SURGERY  1975   lumpectomy-- benign  . BUNIONECTOMY  1991  . CATARACT EXTRACTION W/ INTRAOCULAR LENS  IMPLANT, BILATERAL  2000  . CERVICAL FUSION  March 2013   C5 -- C7  . CESAREAN SECTION  1985  . COLONOSCOPY WITH PROPOFOL N/A 09/29/2019   Procedure: COLONOSCOPY WITH PROPOFOL;  Surgeon: Ronnette Juniper, MD;  Location: [facility];  Service: Gastroenterology;  Laterality: N/A;  . DISTAL INTERPHALANGEAL JOINT FUSION Right 03/14/2015   Procedure: RIGHT LONG FINGER DISTAL INTERPHALANGEAL JOINT ARTHRODESIS;  Surgeon: Iran Planas, MD;  Location: [place];  Service: Orthopedics;  Laterality: Right;  . ESOPHAGOGASTRODUODENOSCOPY (EGD) WITH PROPOFOL N/A 09/29/2019   Procedure: ESOPHAGOGASTRODUODENOSCOPY (EGD) WITH PROPOFOL;  Surgeon: Ronnette Juniper, MD;  Location: [facility];  Service: Gastroenterology;  Laterality: N/A;  . EYE SURGERY  1995   rk (laser surgery), semi cornea transplant, detacted retina,  fluid removal  . KNEE ARTHROSCOPY Left 03-03-2004  . POLYPECTOMY  09/29/2019   Procedure: POLYPECTOMY;  Surgeon: Ronnette Juniper, MD;  Location: [facility];  Service: Gastroenterology;;  . POSTERIOR VITRECTOMY RIGHT EYE AND LASER   10-27-1999  . SHOULDER SURGERY Right 1997  . SPINAL FIXATION SURGERY W/ IMPLANT  2013 rod #1//  2014  rod 2   S1 -- T10  (rod #1)//   S1 -- T4 (rod #2)  . THORACIC FUSION  03-13-2013   REMOVAL HARDWARE/  BONE GRAFT FUSION T10//  REVISION OF RODS  . TOTAL KNEE ARTHROPLASTY  Left 12-14-2005  . UVULOPALATOPHARYNGOPLASTY  04-26-2006   w/  TONSILLECTOMY/  TURBINATE REDUCTIONS/  BILATERAL ANTERIOR ETHMOIDECTOMY     OB History   No obstetric history on file.     Family History  Problem Relation Age of Onset  . Diabetes Mother   . Heart disease Mother   . Diabetes Father   . Anuerysm Father   . Diabetes Brother   . Heart disease Brother   . Diabetes Brother   . Heart disease Brother     Social History   Tobacco Use  . Smoking status: Former Smoker    Packs/day: 0.50    Years: 10.00    Pack years: 5.00    Types: Cigarettes    Quit date: 03/07/1995    Years since quitting: 24.9  . Smokeless tobacco: Never Used  Vaping Use  . Vaping Use: Never used  Substance Use Topics  . Alcohol use: Yes  Comment: rare  . Drug use: Never    Home Medications Prior to Admission medications   Medication Sig Start Date End Date Taking? Authorizing Provider  buPROPion (WELLBUTRIN XL) 300 MG 24 hr tablet Take 300 mg by mouth every morning. 04/07/17   [provider]  clonazePAM (KLONOPIN) 1 MG tablet TAKE 1 TABLET BY MOUTH EVERY DAY AS NEEDED FOR ANXIETY 07/07/19   Susy Frizzle, MD  dicyclomine (BENTYL) 20 MG tablet Take 1 tablet (20 mg total) by mouth 3 (three) times daily before meals. 02/25/20   Rolland Porter, MD  ezetimibe (ZETIA) 10 MG tablet TAKE 1 TABLET BY MOUTH EVERY DAY Patient taking differently: Take 10 mg by mouth daily after breakfast.  07/17/19   Susy Frizzle, MD  fluticasone (FLONASE) 50 MCG/ACT nasal spray Place 2 sprays into both nostrils daily as needed for allergies.     [provider]  fluvoxaMINE (LUVOX) 100 MG tablet Take 125 mg by mouth in the morning, at noon, and at bedtime.  07/19/19   [provider]  leflunomide (ARAVA) 20 MG tablet TAKE 1 TABLET BY MOUTH EVERY DAY 02/12/20   Bo Merino, MD  metoprolol succinate (TOPROL-XL) 50 MG 24 hr tablet TAKE 1 TABLET BY MOUTH ONCE DAILY FOLLOWING A MEAL  11/22/19   Susy Frizzle, MD  naproxen sodium (ALEVE) 220 MG tablet Take 440 mg by mouth 2 (two) times daily as needed (pain).    [provider]  omeprazole (PRILOSEC) 20 MG capsule Take 20 mg by mouth daily.    [provider]  ondansetron (ZOFRAN) 4 MG tablet Take 1 tablet (4 mg total) by mouth every 8 (eight) hours as needed. 02/25/20   Rolland Porter, MD  oxyCODONE-acetaminophen (PERCOCET) 5-325 MG tablet Take 1 tablet by mouth at bedtime. 02/06/20   Susy Frizzle, MD  Probiotic Product (PROBIOTIC PO) Take 1 tablet by mouth daily after breakfast.    [provider]  tiZANidine (ZANAFLEX) 4 MG tablet TAKE 1 TABLET (4 MG TOTAL) BY MOUTH EVERY 6 (SIX) HOURS AS NEEDED FOR MUSCLE SPASMS. 12/12/19   Susy Frizzle, MD  valsartan (DIOVAN) 320 MG tablet TAKE 1 TABLET BY MOUTH EVERY DAY 05/15/19   Susy Frizzle, MD  Vitamin D, Ergocalciferol, (DRISDOL) 1.25 MG (50000 UNIT) CAPS capsule Take 1 capsule (50,000 Units total) by mouth every 7 (seven) days. 11/10/19   Susy Frizzle, MD    Allergies    Latex, Amlodipine besy-benazepril hcl, Augmentin [amoxicillin-pot clavulanate], Bextra [valdecoxib], Chlorhexidine, Lipitor [atorvastatin calcium], Sulfa antibiotics, and Zocor [simvastatin]  Review of Systems   Review of Systems  All other systems reviewed and are negative.   Physical Exam Updated Vital Signs BP (!) 170/82 (BP Location: Right Arm)   Pulse 79   Temp 98.2 F (36.8 C) (Oral)   Resp (!) 21   Ht 1.626 m (5\' 4" )   Wt 91.6 kg   SpO2 99%   BMI 34.67 kg/m   Physical Exam Vitals and nursing note reviewed.  Constitutional:      General: She is not in acute distress.    Appearance: She is well-developed.  HENT:     Head: Normocephalic and atraumatic.     Mouth/Throat:     Pharynx: No oropharyngeal exudate.  Eyes:     General: No scleral icterus.       Right eye: No discharge.        Left eye: No discharge.     Conjunctiva/sclera: Conjunctivae  normal.     Pupils: Pupils are equal, round, and reactive to light.  Neck:     Thyroid: No thyromegaly.     Vascular: No JVD.  Cardiovascular:     Rate and Rhythm: Normal rate and regular rhythm.     Heart sounds: Normal heart sounds. No murmur heard.  No friction rub. No gallop.   Pulmonary:     Effort: Pulmonary effort is normal. No respiratory distress.     Breath sounds: Normal breath sounds. No wheezing or rales.  Abdominal:     General: Bowel sounds are normal. There is no distension.     Palpations: Abdomen is soft. There is no mass.     Tenderness: There is abdominal tenderness ( minimal RUQ and LUQ and epigastric ttp - no lower abd ttp). There is no guarding or rebound.     Hernia: No hernia is present.  Musculoskeletal:        General: No tenderness. Normal range of motion.     Cervical back: Normal range of motion and neck supple.  Lymphadenopathy:     Cervical: No cervical adenopathy.  Skin:    General: Skin is warm and dry.     Findings: No erythema or rash.     Comments: Small area of blistering rash to the L palm - no other rash on arm - slightly tender to palpation.  Neurological:     Mental Status: She is alert.     Coordination: Coordination normal.  Psychiatric:        Behavior: Behavior normal.     ED Results / Procedures / Treatments   Labs (all labs ordered are listed, but only abnormal results are displayed) Labs Reviewed  RESPIRATORY PANEL BY RT PCR (FLU A&B, COVID)  COMPREHENSIVE METABOLIC PANEL  CBC WITH DIFFERENTIAL/PLATELET  LIPASE, BLOOD  TROPONIN I (HIGH SENSITIVITY)    EKG EKG Interpretation  Date/Time:  Saturday February 24 2020 22:39:48 EDT Ventricular Rate:  88 PR Interval:    QRS Duration: 92 QT Interval:  334 QTC Calculation: 404 R Axis:   59 Text Interpretation: Age not entered, assumed to be  69 years old for purpose of ECG interpretation Sinus rhythm Nonspecific repol abnormality, anterior leads since last tracing no  significant change Confirmed by Noemi Chapel [id number]) on 02/24/2020 10:57:48 PM   Radiology DG Chest 2 View  Result Date: 02/24/2020 CLINICAL DATA:  Shortness of breath. EXAM: CHEST - 2 VIEW COMPARISON:  October 29, 2019 FINDINGS: The heart size and mediastinal contours are within normal limits. Both lungs are clear. A radiopaque fusion plate and screws are seen overlying the lower cervical spine. Multiple bilateral pedicle screws are noted throughout the thoracic spine and visualized portion of the lumbar spine. A chronic sixth left rib fracture is seen. IMPRESSION: 1. No active cardiopulmonary disease. 2. Stable postoperative changes within the cervical, thoracic and lumbar spine. Electronically Signed   By: Virgina Norfolk M.D.   On: 02/24/2020 23:19   CT ABDOMEN PELVIS W CONTRAST  Result Date: 02/25/2020 CLINICAL DATA:  Nonlocalized abdominal pain EXAM: CT ABDOMEN AND PELVIS WITH CONTRAST TECHNIQUE: Multidetector CT imaging of the abdomen and pelvis was performed using the standard protocol following bolus administration of intravenous contrast. CONTRAST:  176mL OMNIPAQUE IOHEXOL 300 MG/ML  SOLN COMPARISON:  None. FINDINGS: Lower chest: The visualized heart size within normal limits. No pericardial fluid/thickening. No hiatal hernia. The visualized portions of the lungs are clear. Hepatobiliary: The liver is normal in density without focal abnormality.The  main portal vein is patent. No evidence of calcified gallstones, gallbladder wall thickening or biliary dilatation. Pancreas: Unremarkable. No pancreatic ductal dilatation or surrounding inflammatory changes. Spleen: Normal in size without focal abnormality. 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 The kidneys and collecting system appear normal without evidence of urinary tract calculus or hydronephrosis. Bladder is unremarkable. Stomach/Bowel: The stomach, small bowel, are  normal in appearance. Scattered colonic diverticulosis is. No inflammatory changes, wall thickening, or obstructive findings. Vascular/Lymphatic: There are no enlarged mesenteric, retroperitoneal, or pelvic lymph nodes. Scattered aortic atherosclerotic calcifications are seen without aneurysmal dilatation. Reproductive: The uterus and adnexa are unremarkable. Other: No evidence of abdominal wall mass or hernia. Musculoskeletal: No acute or significant osseous findings. IMPRESSION: No acute intra-abdominopelvic pathology to explain the patient's symptoms. Diverticulosis without diverticulitis. Aortic Atherosclerosis (ICD10-I70.0). Electronically Signed   By: Prudencio Pair M.D.   On: 02/25/2020 00:30    Procedures Procedures (including critical care time)  Medications Ordered in ED Medications  pantoprazole (PROTONIX) injection 40 mg (40 mg Intravenous Given 02/24/20 2309)  alum & mag hydroxide-simeth (MAALOX/MYLANTA) 200-200-20 MG/5ML suspension 30 mL (30 mLs Oral Given 02/24/20 2309)    And  lidocaine (XYLOCAINE) 2 % viscous mouth solution 15 mL (15 mLs Oral Given 02/24/20 2309)  ondansetron (ZOFRAN) injection 4 mg (4 mg Intravenous Given 02/24/20 2310)  iohexol (OMNIPAQUE) 300 MG/ML solution 100 mL (100 mLs Intravenous Contrast Given 02/24/20 2355)    ED Course  I have reviewed the triage vital signs and the nursing notes.  Pertinent labs & imaging results that were available during my care of the patient were reviewed by me and considered in my medical decision making (see chart for details).  Clinical Course as of Feb 25 1447  Sat Feb 24, 2020  2327 WBC: 7.2 [BM]    Clinical Course User Index [BM] Noemi Chapel, MD   MDM Rules/Calculators/A&P                          VS are concerning for BP elevation though she has not been taking her medicines due to nausea - she has known constipation for which she has had some occasional medicines including the Linzess but did not take today - will  obtaine CT scan to r/o other causes of pain - as well as repeat labs - pt agreeable.  The rash is small area - does not seem c/w shingles - no petechia - no tick bites, no fever and normal labs as of 9/27, zofran given. May just be constipation but will look at [facility] and pancreas as potential sources - would also consider needing change or increase in dose of PPI for possible PUD / gastritis (claims to have a hiatal hernia).  At change of shift - care signed out to oncoming EDP to f/u results and disposition accordingly.  Final Clinical Impression(s) / ED Diagnoses Final diagnoses:  Abdominal pain, unspecified abdominal location  Nausea    Rx / DC Orders ED Discharge Orders         Ordered    dicyclomine (BENTYL) 20 MG tablet  3 times daily before meals        02/25/20 0118    ondansetron (ZOFRAN) 4 MG tablet  Every 8 hours PRN        02/25/20 0119           Noemi Chapel, MD 02/25/20 1448

## 2020-02-25 DIAGNOSIS — R10811 Right upper quadrant abdominal tenderness: Secondary | ICD-10-CM | POA: Diagnosis not present

## 2020-02-25 MED ORDER — DICYCLOMINE HCL 20 MG PO TABS: 20.0000 mg | ORAL_TABLET | Freq: Three times a day (TID) | ORAL | 0 refills | Status: DC

## 2020-02-25 MED ORDER — ONDANSETRON HCL 4 MG PO TABS: 4.0000 mg | ORAL_TABLET | Freq: Three times a day (TID) | ORAL | 0 refills | Status: DC | PRN

## 2020-02-25 NOTE — ED Notes (Signed)
Pt back from CT. Pt resting on stretcher rails up x 2 call light in hand. Pt remains a/o x 4 skin warm dry intact. Pt VSS NAD PT denies needs or pain at this time. Pt continues on cardiac monitor and room air.

## 2020-02-25 NOTE — Discharge Instructions (Signed)
Use the Zofran for nausea.  Try the Bentyl 4 times a day for your abdominal discomfort.  Please continue the Linzess that Dr. Dennard Schaumann already prescribed for you.  Consider being evaluated by your gastroenterologist for these new symptoms.  Your CT scan did not show any acute findings, when I look at it however there is some increased stool on the right side of your abdomen.  If you want you could try a laxative like MiraLAX. Get miralax and put one dose or 17 g in 8 ounces of water,  take 1 dose every 30 minutes for 2-3 hours or until you  get good results and then once or twice daily to prevent constipation.

## 2020-02-25 NOTE — ED Provider Notes (Signed)
Patient left at change of shift to get results of her CT of the abdomen/pelvis.  We discussed her CT results.  She states she has been followed by gastroenterology in [place], Dr. Narda Amber however he just recently retired.  She gets endoscopy and colonoscopy every 5 years due to polyps.  She states this is something new and it started about 3 weeks ago.  I tried to stressed with her that the results for good tonight without showing any abnormality and that she needs to follow-up with her gastroenterologist for other testing that is not available in the emergency department.  She was questioning about getting endoscopy and colonoscopy done in the ED.  She was advised she needs to go to the gastroenterologist office and schedule an appointment.  I reviewed her CT scan and she has a lot of stool in the right colon.  Her laboratory test results were all normal.   DG Chest 2 View  Result Date: 02/24/2020 CLINICAL DATA:  Shortness of breath. EXAM: CHEST - 2 VIEW COMPARISON:  October 29, 2019 FINDINGS: The heart size and mediastinal contours are within normal limits. Both lungs are clear. A radiopaque fusion plate and screws are seen overlying the lower cervical spine. Multiple bilateral pedicle screws are noted throughout the thoracic spine and visualized portion of the lumbar spine. A chronic sixth left rib fracture is seen. IMPRESSION: 1. No active cardiopulmonary disease. 2. Stable postoperative changes within the cervical, thoracic and lumbar spine. Electronically Signed   By: Virgina Norfolk M.D.   On: 02/24/2020 23:19   CT ABDOMEN PELVIS W CONTRAST  Result Date: 02/25/2020 CLINICAL DATA:  Nonlocalized abdominal pain EXAM: CT ABDOMEN AND PELVIS WITH CONTRAST TECHNIQUE: Multidetector CT imaging of the abdomen and pelvis was performed using the standard protocol following bolus administration of intravenous contrast. CONTRAST:  167mL OMNIPAQUE IOHEXOL 300 MG/ML  SOLN COMPARISON:  None. FINDINGS: Lower  chest: The visualized heart size within normal limits. No pericardial fluid/thickening. No hiatal hernia. The visualized portions of the lungs are clear. Hepatobiliary: The liver is normal in density without focal abnormality.The main portal vein is patent. No evidence of calcified gallstones, gallbladder wall thickening or biliary dilatation. Pancreas: Unremarkable. No pancreatic ductal dilatation or surrounding inflammatory changes. Spleen: Normal in size without focal abnormality. 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 The kidneys and collecting system appear normal without evidence of urinary tract calculus or hydronephrosis. Bladder is unremarkable. Stomach/Bowel: The stomach, small bowel, are normal in appearance. Scattered colonic diverticulosis is. No inflammatory changes, wall thickening, or obstructive findings. Vascular/Lymphatic: There are no enlarged mesenteric, retroperitoneal, or pelvic lymph nodes. Scattered aortic atherosclerotic calcifications are seen without aneurysmal dilatation. Reproductive: The uterus and adnexa are unremarkable. Other: No evidence of abdominal wall mass or hernia. Musculoskeletal: No acute or significant osseous findings. IMPRESSION: No acute intra-abdominopelvic pathology to explain the patient's symptoms. Diverticulosis without diverticulitis. Aortic Atherosclerosis (ICD10-I70.0). Electronically Signed   By: Prudencio Pair M.D.   On: 02/25/2020 00:30   DG Abd 2 Views  Result Date: 02/20/2020 CLINICAL DATA:  Abdominal pain.  Rule out constipation.  IMPRESSION: No acute abnormality identified. Large amount of stool noted throughout the colon. Constipation could present in this fashion. No bowel distention noted. Electronically Signed   By: Marcello Moores  Register   On: 02/20/2020 10:23   Diagnoses that have been ruled out:  None  Diagnoses that are still under consideration:  None  Final  diagnoses:  Abdominal pain, unspecified abdominal location  Nausea   ED Discharge Orders         Ordered    dicyclomine (BENTYL) 20 MG tablet  3 times daily before meals        02/25/20 0118    ondansetron (ZOFRAN) 4 MG tablet  Every 8 hours PRN        02/25/20 0119           Plan discharge  Rolland Porter, MD, Barbette Or, MD 02/25/20 0120

## 2020-02-29 ENCOUNTER — Other Ambulatory Visit: Payer: Self-pay | Admitting: Family Medicine

## 2020-03-05 ENCOUNTER — Other Ambulatory Visit: Payer: Self-pay

## 2020-03-05 ENCOUNTER — Ambulatory Visit (INDEPENDENT_AMBULATORY_CARE_PROVIDER_SITE_OTHER): Payer: Medicare Other | Admitting: Family Medicine

## 2020-03-05 VITALS — BP 160/100 | HR 88 | Temp 97.9°F | Ht 64.0 in | Wt 196.0 lb

## 2020-03-05 DIAGNOSIS — R1084 Generalized abdominal pain: Secondary | ICD-10-CM | POA: Diagnosis not present

## 2020-03-05 DIAGNOSIS — Z23 Encounter for immunization: Secondary | ICD-10-CM | POA: Diagnosis not present

## 2020-03-05 MED ORDER — ONDANSETRON HCL 4 MG PO TABS
4.0000 mg | ORAL_TABLET | Freq: Three times a day (TID) | ORAL | 0 refills | Status: DC | PRN
Start: 2020-03-05 — End: 2020-04-25

## 2020-03-05 NOTE — Progress Notes (Signed)
Subjective:    Patient ID: Janet Peterson, female    DOB: [birth year], 69 y.o.   MRN: [id number]  HPI  6/21 Patient was recently admitted to the hospital with suicidal ideation.  She has since been discharged from behavioral health and requested a refill on her oxycodone.  However I wanted to see the patient first prior to refilling a controlled substance.  She is here today with her husband.  They both endorse that she is doing much better.  At the time, she was thinking about her son who has died.  She was having thoughts of wanting to overdose on her medication.  She states that she is no longer having those thoughts.  She is in a much better place.  Her psychiatrist increased her dose of fluvoxamine.  She is using Percocet at night to help her sleep and occasionally during the daytime if she walks a long distance.  She was started on vitamin D for vitamin D deficiency while in the hospital.  Vitamin D level was in the 20s.  At that time, my plan was: Patient states that she is doing better.  I am no longer concerned that she is actively suicidal.  I feel comfortable refilling her Percocet that she has been taking for quite some time.  Both she and her husband state that she is doing better with no concerns.  Therefore I will refill her Percocet, 60 tablets/month.  I will also refill the Roselyn Meier that she is taking as needed for migraines.  I recommended vitamin D 50,000 units weekly for 6 months and then recheck vitamin D level.  Also recommended that she decrease her Prilosec to 40 mg a day and if tolerating this in the future could try switching to Pepcid.  12/14/19 Blood pressure today is well controlled at 110/70. Patient is going on a 1 month vacation across the [place]. However her prescription will expire before she returns home from her vacation. She takes 1 Percocet a day due to pain in her joints and in her neck due to the psoriatic arthritis. She is asking that I refill her  prescription earlier so that she can take it with her. I would be happy to do that. She shows no evidence of abuse or diversion. She is also requesting a heel lift for her right shoe. 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 She benefits from a shoe lift on the right foot to help correct this which helps with hip pain and lower back pain   02/19/20 Patient states her symptoms began approximately 15 days ago.  She was driving down the road and suddenly became extremely nauseated.  Her husband had to pull over and she vomited approximately 6 times.  This occurred suddenly and without warning.  She then felt better however a few days later she started developing dark green stool.  She states that she was also having left-sided abdominal pain.  The pain is sharp.  Tends to be located on the left side.  She denies any dysuria or urgency or frequency.  She denies any hematuria.  She denies any melena or hematochezia.  She denies any reflux-like symptoms.  She denies any heartburn.  She does report constipation.  In fact the first week that the nausea and pain was occurring, the patient states that she was constipated and eventually had a taken Ex-Lax.  She now states that she is  having a bowel movement every day however it has changed in color and is green.  Furthermore she reports nausea as soon as she eats.  At that time, my plan was: I believe the patient's pain is likely due to constipation.  I believe that she may be so constipated that is distending her intestines causing pain and contributing to nausea.  I will obtain an x-ray to evaluate further to confirm my suspicions.  Meanwhile check a CBC to look for leukocytosis along with a CMP.  Other potential causes would be diverticulitis given the location of the pain.  If there is an elevated white blood cell count or the pain intensifies or if the x-ray shows no constipation, I will treat the patient  empirically for possible diverticulitis.  Begin Linzess 145 mcg daily  03/05/20 The x-ray that I obtained showed a large amount of stool in the colon.  Therefore I recommended that she try Linzess.  She did but then she went to the hospital in early October due to abdominal pain.  In the hospital chest x-ray was unremarkable.  CT scan was unremarkable.  CMP and CBC were unremarkable.  Patient was diagnosed with irritable bowel symptoms and given Bentyl for abdominal pain.  She has stopped the Linzess.  She is back to having a bowel movement every 3 or 4 days.  The Bentyl does help with the abdominal pain.  Zofran also helps with the abdominal pain and nausea and she would like to have a prescription for the Zofran. Past Medical History:  Diagnosis Date  . Ankylosing spondylitis ([place])   . Anxiety and depression   . Depression   . Fibromyalgia   . GERD (gastroesophageal reflux disease)   . History of basal cell carcinoma excision    FACE, [place]  . History of hiatal hernia   . History of thrush   . Hyperlipidemia   . Hypertension   . Insomnia   . Left breast mass   . OCD (obsessive compulsive disorder)   . OSA (obstructive sleep apnea)    MILD AND NO CPAP SINCE SURGERY IN 2007  . Osteopenia   . PONV (postoperative nausea and vomiting)   . Psoriatic arthritis ([place])   . PVC (premature ventricular contraction)   . Rheumatoid arthritis [facility])    Past Surgical History:  Procedure Laterality Date  . BIOPSY  09/29/2019   Procedure: BIOPSY;  Surgeon: Ronnette Juniper, MD;  Location: [facility];  Service: Gastroenterology;;  . BREAST LUMPECTOMY WITH RADIOACTIVE SEED LOCALIZATION Left 05/04/2018   Procedure: LEFT BREAST LUMPECTOMY WITH RADIOACTIVE SEED LOCALIZATION AND LEFT BREAST NIPPLE BIOPSY;  Surgeon: Jovita Kussmaul, MD;  Location: [place];  Service: General;  Laterality: Left;  . BREAST SURGERY  1975   lumpectomy-- benign  . BUNIONECTOMY  1991  . CATARACT EXTRACTION  W/ INTRAOCULAR LENS  IMPLANT, BILATERAL  2000  . CERVICAL FUSION  March 2013   C5 -- C7  . CESAREAN SECTION  1985  . COLONOSCOPY WITH PROPOFOL N/A 09/29/2019   Procedure: COLONOSCOPY WITH PROPOFOL;  Surgeon: Ronnette Juniper, MD;  Location: [facility];  Service: Gastroenterology;  Laterality: N/A;  . DISTAL INTERPHALANGEAL JOINT FUSION Right 03/14/2015   Procedure: RIGHT LONG FINGER DISTAL INTERPHALANGEAL JOINT ARTHRODESIS;  Surgeon: Iran Planas, MD;  Location: [place];  Service: Orthopedics;  Laterality: Right;  . ESOPHAGOGASTRODUODENOSCOPY (EGD) WITH PROPOFOL N/A 09/29/2019   Procedure: ESOPHAGOGASTRODUODENOSCOPY (EGD) WITH PROPOFOL;  Surgeon: Ronnette Juniper, MD;  Location: [facility];  Service: Gastroenterology;  Laterality: N/A;  . EYE SURGERY  1995   rk (laser surgery), semi cornea transplant, detacted retina,  fluid removal  . KNEE ARTHROSCOPY Left 03-03-2004  . POLYPECTOMY  09/29/2019   Procedure: POLYPECTOMY;  Surgeon: Ronnette Juniper, MD;  Location: [facility];  Service: Gastroenterology;;  . POSTERIOR VITRECTOMY RIGHT EYE AND LASER   10-27-1999  . SHOULDER SURGERY Right 1997  . SPINAL FIXATION SURGERY W/ IMPLANT  2013 rod #1//  2014  rod 2   S1 -- T10  (rod #1)//   S1 -- T4 (rod #2)  . THORACIC FUSION  03-13-2013   REMOVAL HARDWARE/  BONE GRAFT FUSION T10//  REVISION OF RODS  . TOTAL KNEE ARTHROPLASTY Left 12-14-2005  . UVULOPALATOPHARYNGOPLASTY  04-26-2006   w/  TONSILLECTOMY/  TURBINATE REDUCTIONS/  BILATERAL ANTERIOR ETHMOIDECTOMY   Current Outpatient Medications on File Prior to Visit  Medication Sig Dispense Refill  . buPROPion (WELLBUTRIN XL) 300 MG 24 hr tablet Take 300 mg by mouth every morning.  1  . clonazePAM (KLONOPIN) 1 MG tablet TAKE 1 TABLET BY MOUTH EVERY DAY AS NEEDED FOR ANXIETY 30 tablet 1  . dicyclomine (BENTYL) 20 MG tablet Take 1 tablet (20 mg total) by mouth 3 (three) times daily before meals. 40 tablet 0  . ezetimibe (ZETIA) 10 MG tablet TAKE 1  TABLET BY MOUTH EVERY DAY (Patient taking differently: Take 10 mg by mouth daily after breakfast. ) 90 tablet 3  . fluticasone (FLONASE) 50 MCG/ACT nasal spray Place 2 sprays into both nostrils daily as needed for allergies.     . fluvoxaMINE (LUVOX) 100 MG tablet Take 125 mg by mouth in the morning, at noon, and at bedtime.     Marland Kitchen leflunomide (ARAVA) 20 MG tablet TAKE 1 TABLET BY MOUTH EVERY DAY 90 tablet 0  . metoprolol succinate (TOPROL-XL) 50 MG 24 hr tablet TAKE 1 TABLET BY MOUTH ONCE DAILY FOLLOWING A MEAL 90 tablet 3  . naproxen sodium (ALEVE) 220 MG tablet Take 440 mg by mouth 2 (two) times daily as needed (pain).    Marland Kitchen omeprazole (PRILOSEC) 20 MG capsule Take 20 mg by mouth daily.    Marland Kitchen omeprazole (PRILOSEC) 40 MG capsule TAKE 1 CAPSULE BY MOUTH EVERY MORNING AND AT BEDTIME 180 capsule 1  . ondansetron (ZOFRAN) 4 MG tablet Take 1 tablet (4 mg total) by mouth every 8 (eight) hours as needed. 6 tablet 0  . oxyCODONE-acetaminophen (PERCOCET) 5-325 MG tablet Take 1 tablet by mouth at bedtime. 60 tablet 0  . Probiotic Product (PROBIOTIC PO) Take 1 tablet by mouth daily after breakfast.    . tiZANidine (ZANAFLEX) 4 MG tablet TAKE 1 TABLET (4 MG TOTAL) BY MOUTH EVERY 6 (SIX) HOURS AS NEEDED FOR MUSCLE SPASMS. 30 tablet 0  . valsartan (DIOVAN) 320 MG tablet TAKE 1 TABLET BY MOUTH EVERY DAY 90 tablet 3  . Vitamin D, Ergocalciferol, (DRISDOL) 1.25 MG (50000 UNIT) CAPS capsule Take 1 capsule (50,000 Units total) by mouth every 7 (seven) days. 5 capsule 5   No current facility-administered medications on file prior to visit.   Allergies  Allergen Reactions  . Latex Rash    And Mouth sores  . Amlodipine Besy-Benazepril Hcl Other (See Comments)    COUGH  . Augmentin [Amoxicillin-Pot Clavulanate] Diarrhea and Nausea And Vomiting  . Bextra [Valdecoxib] Diarrhea and Nausea And Vomiting    REFLUX  . Chlorhexidine     rash  . Lipitor [Atorvastatin Calcium]  Other (See Comments)    MYALGIAS  . Sulfa  Antibiotics Nausea And Vomiting    CRAMPING  . Zocor [Simvastatin] Other (See Comments)    MYALGIA   Social History   Socioeconomic History  . Marital status: Married    Spouse name: Not on file  . Number of children: Not on file  . Years of education: Not on file  . Highest education level: Not on file  Occupational History  . Not on file  Tobacco Use  . Smoking status: Former Smoker    Packs/day: 0.50    Years: 10.00    Pack years: 5.00    Types: Cigarettes    Quit date: 03/07/1995    Years since quitting: 25.0  . Smokeless tobacco: Never Used  Vaping Use  . Vaping Use: Never used  Substance and Sexual Activity  . Alcohol use: Yes    Comment: rare  . Drug use: Never  . Sexual activity: Not on file  Other Topics Concern  . Not on file  Social History Narrative  . Not on file   Social Determinants of Health   Financial Resource Strain:   . Difficulty of Paying Living Expenses: Not on file  Food Insecurity:   . Worried About Charity fundraiser in the Last Year: Not on file  . Ran Out of Food in the Last Year: Not on file  Transportation Needs:   . Lack of Transportation (Medical): Not on file  . Lack of Transportation (Non-Medical): Not on file  Physical Activity:   . Days of Exercise per Week: Not on file  . Minutes of Exercise per Session: Not on file  Stress:   . Feeling of Stress : Not on file  Social Connections:   . Frequency of Communication with Friends and Family: Not on file  . Frequency of Social Gatherings with Friends and Family: Not on file  . Attends Religious Services: Not on file  . Active Member of Clubs or Organizations: Not on file  . Attends Archivist Meetings: Not on file  . Marital Status: Not on file  Intimate Partner Violence:   . Fear of Current or Ex-Partner: Not on file  . Emotionally Abused: Not on file  . Physically Abused: Not on file  . Sexually Abused: Not on file    Review of Systems  All other systems  reviewed and are negative.      Objective:   Physical Exam Vitals reviewed.  Constitutional:      General: She is not in acute distress.    Appearance: Normal appearance. She is normal weight. She is not ill-appearing or toxic-appearing.  Cardiovascular:     Rate and Rhythm: Normal rate and regular rhythm.     Heart sounds: Normal heart sounds. No murmur heard.  No friction rub. No gallop.   Pulmonary:     Effort: Pulmonary effort is normal. No respiratory distress.     Breath sounds: Normal breath sounds. No wheezing, rhonchi or rales.  Abdominal:     General: Abdomen is flat. Bowel sounds are normal. There is no distension.     Palpations: Abdomen is soft.     Tenderness: There is no abdominal tenderness.  Musculoskeletal:     Right lower leg: No edema.     Left lower leg: No edema.  Neurological:     Mental Status: She is alert.           Assessment & Plan:  Generalized abdominal  pain  I believe her abdominal pain is due to a combination of constipation and irritable bowel.  I recommended that she stay on Linzess 145 mcg daily to try to have a bowel movement every day to help prevent the abdominal pain.  She can also certainly use Bentyl for intestinal spasms.  I encouraged her to eat more fiber and drink more water.  I will refill her Zofran.

## 2020-03-22 ENCOUNTER — Ambulatory Visit (INDEPENDENT_AMBULATORY_CARE_PROVIDER_SITE_OTHER): Payer: Medicare Other | Admitting: Family Medicine

## 2020-03-22 ENCOUNTER — Other Ambulatory Visit: Payer: Self-pay

## 2020-03-22 VITALS — BP 150/90 | HR 73 | Temp 97.9°F | Ht 64.0 in | Wt 199.0 lb

## 2020-03-22 DIAGNOSIS — R110 Nausea: Secondary | ICD-10-CM

## 2020-03-22 DIAGNOSIS — R1084 Generalized abdominal pain: Secondary | ICD-10-CM | POA: Diagnosis not present

## 2020-03-22 MED ORDER — PROMETHAZINE HCL 25 MG PO TABS: 25.0000 mg | ORAL_TABLET | Freq: Two times a day (BID) | ORAL | 0 refills | Status: DC | PRN

## 2020-03-22 NOTE — Progress Notes (Signed)
Subjective:    Patient ID: Janet Peterson, female    DOB: [birth year], 69 y.o.   MRN: [id number]  Hypertension  Abdominal Pain    6/21 Patient was recently admitted to the hospital with suicidal ideation.  She has since been discharged from behavioral health and requested a refill on her oxycodone.  However I wanted to see the patient first prior to refilling a controlled substance.  She is here today with her husband.  They both endorse that she is doing much better.  At the time, she was thinking about her son who has died.  She was having thoughts of wanting to overdose on her medication.  She states that she is no longer having those thoughts.  She is in a much better place.  Her psychiatrist increased her dose of fluvoxamine.  She is using Percocet at night to help her sleep and occasionally during the daytime if she walks a long distance.  She was started on vitamin D for vitamin D deficiency while in the hospital.  Vitamin D level was in the 20s.  At that time, my plan was: Patient states that she is doing better.  I am no longer concerned that she is actively suicidal.  I feel comfortable refilling her Percocet that she has been taking for quite some time.  Both she and her husband state that she is doing better with no concerns.  Therefore I will refill her Percocet, 60 tablets/month.  I will also refill the Roselyn Meier that she is taking as needed for migraines.  I recommended vitamin D 50,000 units weekly for 6 months and then recheck vitamin D level.  Also recommended that she decrease her Prilosec to 40 mg a day and if tolerating this in the future could try switching to Pepcid.  12/14/19 Blood pressure today is well controlled at 110/70. Patient is going on a 1 month vacation across the [place]. However her prescription will expire before she returns home from her vacation. She takes 1 Percocet a day due to pain in her joints and in her neck due to the psoriatic arthritis. She  is asking that I refill her prescription earlier so that she can take it with her. I would be happy to do that. She shows no evidence of abuse or diversion. She is also requesting a heel lift for her right shoe. 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 She benefits from a shoe lift on the right foot to help correct this which helps with hip pain and lower back pain   02/19/20 Patient states her symptoms began approximately 15 days ago.  She was driving down the road and suddenly became extremely nauseated.  Her husband had to pull over and she vomited approximately 6 times.  This occurred suddenly and without warning.  She then felt better however a few days later she started developing dark green stool.  She states that she was also having left-sided abdominal pain.  The pain is sharp.  Tends to be located on the left side.  She denies any dysuria or urgency or frequency.  She denies any hematuria.  She denies any melena or hematochezia.  She denies any reflux-like symptoms.  She denies any heartburn.  She does report constipation.  In fact the first week that the nausea and pain was occurring, the patient states that she was constipated and eventually had a taken Ex-Lax.  She  now states that she is having a bowel movement every day however it has changed in color and is green.  Furthermore she reports nausea as soon as she eats.  At that time, my plan was: I believe the patient's pain is likely due to constipation.  I believe that she may be so constipated that is distending her intestines causing pain and contributing to nausea.  I will obtain an x-ray to evaluate further to confirm my suspicions.  Meanwhile check a CBC to look for leukocytosis along with a CMP.  Other potential causes would be diverticulitis given the location of the pain.  If there is an elevated white blood cell count or the pain intensifies or if the x-ray shows no constipation, I  will treat the patient empirically for possible diverticulitis.  Begin Linzess 145 mcg daily  03/05/20 The x-ray that I obtained showed a large amount of stool in the colon.  Therefore I recommended that she try Linzess.  She did but then she went to the hospital in early October due to abdominal pain.  In the hospital chest x-ray was unremarkable.  CT scan was unremarkable.  CMP and CBC were unremarkable.  Patient was diagnosed with irritable bowel symptoms and given Bentyl for abdominal pain.  She has stopped the Linzess.  She is back to having a bowel movement every 3 or 4 days.  The Bentyl does help with the abdominal pain.  Zofran also helps with the abdominal pain and nausea and she would like to have a prescription for the Zofran.  At that time, my plan was: I believe her abdominal pain is due to a combination of constipation and irritable bowel.  I recommended that she stay on Linzess 145 mcg daily to try to have a bowel movement every day to help prevent the abdominal pain.  She can also certainly use Bentyl for intestinal spasms.  I encouraged her to eat more fiber and drink more water.  I will refill her Zofran.  03/22/20 Patient had to discontinue the Linzess because she felt like it was making her nausea worse.  Since I last saw her she states that the abdominal pains have subsided.  However she does report epigastric discomfort especially after she eats.  She reports nausea after eating.  She is taking the Zofran but she states it takes 2 hours for the Zofran to take effect.  She also reports having a difficult time sleeping.  She states that she stays awake at night unable to fall asleep.  She denies any melena or hematochezia.  She denies any fever or chills.  She denies any vomiting.  She had an EGD in May that revealed a hiatal hernia but was otherwise normal.  She had a colonoscopy in May as well.  She had a CT scan in October that revealed no explanation for her pain.  Therefore I believe  a lot of her symptoms could be related to irritable bowel plus medication side effects.  Of note she is taking Aleve every day for her back pain.  This could potentially be causing gastritis despite the fact she is on omeprazole. Past Medical History:  Diagnosis Date  . Ankylosing spondylitis ([place])   . Anxiety and depression   . Depression   . Fibromyalgia   . GERD (gastroesophageal reflux disease)   . History of basal cell carcinoma excision    FACE, [place]  . History of hiatal hernia   . History of  thrush   . Hyperlipidemia   . Hypertension   . Insomnia   . Left breast mass   . OCD (obsessive compulsive disorder)   . OSA (obstructive sleep apnea)    MILD AND NO CPAP SINCE SURGERY IN 2007  . Osteopenia   . PONV (postoperative nausea and vomiting)   . Psoriatic arthritis ([place])   . PVC (premature ventricular contraction)   . Rheumatoid arthritis [facility])    Past Surgical History:  Procedure Laterality Date  . BIOPSY  09/29/2019   Procedure: BIOPSY;  Surgeon: Ronnette Juniper, MD;  Location: [facility];  Service: Gastroenterology;;  . BREAST LUMPECTOMY WITH RADIOACTIVE SEED LOCALIZATION Left 05/04/2018   Procedure: LEFT BREAST LUMPECTOMY WITH RADIOACTIVE SEED LOCALIZATION AND LEFT BREAST NIPPLE BIOPSY;  Surgeon: Jovita Kussmaul, MD;  Location: [place];  Service: General;  Laterality: Left;  . BREAST SURGERY  1975   lumpectomy-- benign  . BUNIONECTOMY  1991  . CATARACT EXTRACTION W/ INTRAOCULAR LENS  IMPLANT, BILATERAL  2000  . CERVICAL FUSION  March 2013   C5 -- C7  . CESAREAN SECTION  1985  . COLONOSCOPY WITH PROPOFOL N/A 09/29/2019   Procedure: COLONOSCOPY WITH PROPOFOL;  Surgeon: Ronnette Juniper, MD;  [facility];  Service: Gastroenterology;  Laterality: N/A;  . DISTAL INTERPHALANGEAL JOINT FUSION Right 03/14/2015   Procedure: RIGHT LONG FINGER DISTAL INTERPHALANGEAL JOINT ARTHRODESIS;  Surgeon: Iran Planas, MD;  Location: [place];   Service: Orthopedics;  Laterality: Right;  . ESOPHAGOGASTRODUODENOSCOPY (EGD) WITH PROPOFOL N/A 09/29/2019   Procedure: ESOPHAGOGASTRODUODENOSCOPY (EGD) WITH PROPOFOL;  Surgeon: Ronnette Juniper, MD;  [facility];  Service: Gastroenterology;  Laterality: N/A;  . EYE SURGERY  1995   rk (laser surgery), semi cornea transplant, detacted retina,  fluid removal  . KNEE ARTHROSCOPY Left 03-03-2004  . POLYPECTOMY  09/29/2019   Procedure: POLYPECTOMY;  Surgeon: Ronnette Juniper, MD;  [facility];  Service: Gastroenterology;;  . POSTERIOR VITRECTOMY RIGHT EYE AND LASER   10-27-1999  . SHOULDER SURGERY Right 1997  . SPINAL FIXATION SURGERY W/ IMPLANT  2013 rod #1//  2014  rod 2   S1 -- T10  (rod #1)//   S1 -- T4 (rod #2)  . THORACIC FUSION  03-13-2013   REMOVAL HARDWARE/  BONE GRAFT FUSION T10//  REVISION OF RODS  . TOTAL KNEE ARTHROPLASTY Left 12-14-2005  . UVULOPALATOPHARYNGOPLASTY  04-26-2006   w/  TONSILLECTOMY/  TURBINATE REDUCTIONS/  BILATERAL ANTERIOR ETHMOIDECTOMY   Current Outpatient Medications on File Prior to Visit  Medication Sig Dispense Refill  . buPROPion (WELLBUTRIN XL) 300 MG 24 hr tablet Take 300 mg by mouth every morning.  1  . clonazePAM (KLONOPIN) 1 MG tablet TAKE 1 TABLET BY MOUTH EVERY DAY AS NEEDED FOR ANXIETY 30 tablet 1  . dicyclomine (BENTYL) 20 MG tablet Take 1 tablet (20 mg total) by mouth 3 (three) times daily before meals. 40 tablet 0  . ezetimibe (ZETIA) 10 MG tablet TAKE 1 TABLET BY MOUTH EVERY DAY (Patient taking differently: Take 10 mg by mouth daily after breakfast. ) 90 tablet 3  . fluticasone (FLONASE) 50 MCG/ACT nasal spray Place 2 sprays into both nostrils daily as needed for allergies.     . fluvoxaMINE (LUVOX) 100 MG tablet Take 125 mg by mouth in the morning, at noon, and at bedtime.     Marland Kitchen leflunomide (ARAVA) 20 MG tablet TAKE 1 TABLET BY MOUTH EVERY DAY 90 tablet 0  . metoprolol succinate (TOPROL-XL) 50  MG 24 hr tablet TAKE 1 TABLET BY MOUTH ONCE  DAILY FOLLOWING A MEAL 90 tablet 3  . naproxen sodium (ALEVE) 220 MG tablet Take 440 mg by mouth 2 (two) times daily as needed (pain).    Marland Kitchen omeprazole (PRILOSEC) 20 MG capsule Take 20 mg by mouth daily.    Marland Kitchen omeprazole (PRILOSEC) 40 MG capsule TAKE 1 CAPSULE BY MOUTH EVERY MORNING AND AT BEDTIME 180 capsule 1  . ondansetron (ZOFRAN) 4 MG tablet Take 1 tablet (4 mg total) by mouth every 8 (eight) hours as needed. 30 tablet 0  . oxyCODONE-acetaminophen (PERCOCET) 5-325 MG tablet Take 1 tablet by mouth at bedtime. 60 tablet 0  . Probiotic Product (PROBIOTIC PO) Take 1 tablet by mouth daily after breakfast.    . tiZANidine (ZANAFLEX) 4 MG tablet TAKE 1 TABLET (4 MG TOTAL) BY MOUTH EVERY 6 (SIX) HOURS AS NEEDED FOR MUSCLE SPASMS. 30 tablet 0  . valsartan (DIOVAN) 320 MG tablet TAKE 1 TABLET BY MOUTH EVERY DAY 90 tablet 3  . Vitamin D, Ergocalciferol, (DRISDOL) 1.25 MG (50000 UNIT) CAPS capsule Take 1 capsule (50,000 Units total) by mouth every 7 (seven) days. 5 capsule 5   No current facility-administered medications on file prior to visit.   Allergies  Allergen Reactions  . Latex Rash    And Mouth sores  . Amlodipine Besy-Benazepril Hcl Other (See Comments)    COUGH  . Augmentin [Amoxicillin-Pot Clavulanate] Diarrhea and Nausea And Vomiting  . Bextra [Valdecoxib] Diarrhea and Nausea And Vomiting    REFLUX  . Chlorhexidine     rash  . Lipitor [Atorvastatin Calcium] Other (See Comments)    MYALGIAS  . Sulfa Antibiotics Nausea And Vomiting    CRAMPING  . Zocor [Simvastatin] Other (See Comments)    MYALGIA   Social History   Socioeconomic History  . Marital status: Married    Spouse name: Not on file  . Number of children: Not on file  . Years of education: Not on file  . Highest education level: Not on file  Occupational History  . Not on file  Tobacco Use  . Smoking status: Former Smoker    Packs/day: 0.50    Years: 10.00    Pack years: 5.00    Types: Cigarettes    Quit date:  03/07/1995    Years since quitting: 25.0  . Smokeless tobacco: Never Used  Vaping Use  . Vaping Use: Never used  Substance and Sexual Activity  . Alcohol use: Yes    Comment: rare  . Drug use: Never  . Sexual activity: Not on file  Other Topics Concern  . Not on file  Social History Narrative  . Not on file   Social Determinants of Health   Financial Resource Strain:   . Difficulty of Paying Living Expenses: Not on file  Food Insecurity:   . Worried About Charity fundraiser in the Last Year: Not on file  . Ran Out of Food in the Last Year: Not on file  Transportation Needs:   . Lack of Transportation (Medical): Not on file  . Lack of Transportation (Non-Medical): Not on file  Physical Activity:   . Days of Exercise per Week: Not on file  . Minutes of Exercise per Session: Not on file  Stress:   . Feeling of Stress : Not on file  Social Connections:   . Frequency of Communication with Friends and Family: Not on file  . Frequency of Social Gatherings with Friends  and Family: Not on file  . Attends Religious Services: Not on file  . Active Member of Clubs or Organizations: Not on file  . Attends Archivist Meetings: Not on file  . Marital Status: Not on file  Intimate Partner Violence:   . Fear of Current or Ex-Partner: Not on file  . Emotionally Abused: Not on file  . Physically Abused: Not on file  . Sexually Abused: Not on file    Review of Systems  Gastrointestinal: Positive for abdominal pain.  All other systems reviewed and are negative.      Objective:   Physical Exam Vitals reviewed.  Constitutional:      General: She is not in acute distress.    Appearance: Normal appearance. She is normal weight. She is not ill-appearing or toxic-appearing.  Cardiovascular:     Rate and Rhythm: Normal rate and regular rhythm.     Heart sounds: Normal heart sounds. No murmur heard.  No friction rub. No gallop.   Pulmonary:     Effort: Pulmonary effort is  normal. No respiratory distress.     Breath sounds: Normal breath sounds. No wheezing, rhonchi or rales.  Abdominal:     General: Abdomen is flat. Bowel sounds are normal. There is no distension.     Palpations: Abdomen is soft.     Tenderness: There is no abdominal tenderness.  Musculoskeletal:     Right lower leg: No edema.     Left lower leg: No edema.  Neurological:     Mental Status: She is alert.           Assessment & Plan:  Generalized abdominal pain  Nausea  I believe the majority of her symptoms are IBS.  Therefore we need to focus on finding ways to help manage her symptoms.  I think the Aleve could be contributing some to her dyspepsia.  Therefore I recommended that she discontinue Aleve altogether and allow 2 weeks to see if the irritation in her stomach will improve.  I also recommended that we try premedicating with Phenergan rather than waiting until she becomes nauseated and then taking Zofran.  I chose Phenergan because I am hoping that it will help her sleep better at night.  I recommended that she premedicate with Phenergan at night prior to bed.  Hopefully this will help her fall asleep easier and not be habit-forming.  I hope that this will also be still present somewhat when she wakes up in the morning so that she is not as nauseated in the morning.  She can then take a Phenergan again during the daytime to help with nausea prior to lunch and dinner.  Therefore we will schedule the Phenergan twice daily and see if this helps settle her stomach.  She has an appointment to follow-up with her gastroenterologist in December hopefully we can help alleviate some of her symptoms prior to that.

## 2020-03-27 NOTE — Progress Notes (Deleted)
Office Visit Note  Patient: Janet Peterson             Date of Birth: Oct 04, 1950           MRN: [id number]             PCP: Susy Frizzle, MD Referring: Susy Frizzle, MD Visit Date: 04/09/2020 Occupation: @GUAROCC @  Subjective:  No chief complaint on file.   History of Present Illness: Janet Peterson is a 69 y.o. female ***   Activities of Daily Living:  Patient reports morning stiffness for *** {minute/hour:19697}.   Patient {ACTIONS;DENIES/REPORTS:21021675::"Denies"} nocturnal pain.  Difficulty dressing/grooming: {ACTIONS;DENIES/REPORTS:21021675::"Denies"} Difficulty climbing stairs: {ACTIONS;DENIES/REPORTS:21021675::"Denies"} Difficulty getting out of chair: {ACTIONS;DENIES/REPORTS:21021675::"Denies"} Difficulty using hands for taps, buttons, cutlery, and/or writing: {ACTIONS;DENIES/REPORTS:21021675::"Denies"}  No Rheumatology ROS completed.   PMFS History:  Patient Active Problem List   Diagnosis Date Noted  . C. difficile colitis 09/29/2019  . Colitis 09/28/2019  . Anxiety and depression 09/28/2019  . DDD (degenerative disc disease), cervical s/p fusion 11/12/2016  . H/O total knee replacement, left 05/06/2016  . DDD lumbar spine status post fusion 05/06/2016  . Psoriasis 05/05/2016  . High risk medication use 05/05/2016  . Cervical post-laminectomy syndrome 12/09/2015  . Thoracic postlaminectomy syndrome 12/09/2015  . Psoriatic arthritis ([place]) 08/23/2012  . Osteopenia 08/23/2012  . HLD (hyperlipidemia) 08/23/2012  . RLS (restless legs syndrome) 08/23/2012  . Insomnia 08/23/2012  . Fibromyalgia syndrome 08/12/2012  . Low back pain 08/12/2012  . Syncope   . PVC (premature ventricular contraction)   . OCD (obsessive compulsive disorder)   . GERD (gastroesophageal reflux disease)   . Hypertension     Past Medical History:  Diagnosis Date  . Ankylosing spondylitis ([place])   . Anxiety and depression   . Depression   . Fibromyalgia   . GERD  (gastroesophageal reflux disease)   . History of basal cell carcinoma excision    FACE, [place]  . History of hiatal hernia   . History of thrush   . Hyperlipidemia   . Hypertension   . Insomnia   . Left breast mass   . OCD (obsessive compulsive disorder)   . OSA (obstructive sleep apnea)    MILD AND NO CPAP SINCE SURGERY IN 2007  . Osteopenia   . PONV (postoperative nausea and vomiting)   . Psoriatic arthritis ([place])   . PVC (premature ventricular contraction)   . Rheumatoid arthritis ([place])     Family History  Problem Relation Age of Onset  . Diabetes Mother   . Heart disease Mother   . Diabetes Father   . Anuerysm Father   . Diabetes Brother   . Heart disease Brother   . Diabetes Brother   . Heart disease Brother    Past Surgical History:  Procedure Laterality Date  . BIOPSY  09/29/2019   Procedure: BIOPSY;  Surgeon: Ronnette Juniper, MD;  [facility];  Service: Gastroenterology;;  . BREAST LUMPECTOMY WITH RADIOACTIVE SEED LOCALIZATION Left 05/04/2018   Procedure: LEFT BREAST LUMPECTOMY WITH RADIOACTIVE SEED LOCALIZATION AND LEFT BREAST NIPPLE BIOPSY;  Surgeon: Jovita Kussmaul, MD;  Location: [place];  Service: General;  Laterality: Left;  . BREAST SURGERY  1975   lumpectomy-- benign  . BUNIONECTOMY  1991  . CATARACT EXTRACTION W/ INTRAOCULAR LENS  IMPLANT, BILATERAL  2000  . CERVICAL FUSION  March 2013   C5 -- C7  . CESAREAN SECTION  1985  . COLONOSCOPY WITH PROPOFOL N/A  09/29/2019   Procedure: COLONOSCOPY WITH PROPOFOL;  Surgeon: Ronnette Juniper, MD;  [facility];  Service: Gastroenterology;  Laterality: N/A;  . DISTAL INTERPHALANGEAL JOINT FUSION Right 03/14/2015   Procedure: RIGHT LONG FINGER DISTAL INTERPHALANGEAL JOINT ARTHRODESIS;  Surgeon: Iran Planas, MD;  Location: [place];  Service: Orthopedics;  Laterality: Right;  . ESOPHAGOGASTRODUODENOSCOPY (EGD) WITH PROPOFOL N/A 09/29/2019   Procedure:  ESOPHAGOGASTRODUODENOSCOPY (EGD) WITH PROPOFOL;  Surgeon: Ronnette Juniper, MD;  [facility];  Service: Gastroenterology;  Laterality: N/A;  . EYE SURGERY  1995   rk (laser surgery), semi cornea transplant, detacted retina,  fluid removal  . KNEE ARTHROSCOPY Left 03-03-2004  . POLYPECTOMY  09/29/2019   Procedure: POLYPECTOMY;  Surgeon: Ronnette Juniper, MD;  [facility];  Service: Gastroenterology;;  . POSTERIOR VITRECTOMY RIGHT EYE AND LASER   10-27-1999  . SHOULDER SURGERY Right 1997  . SPINAL FIXATION SURGERY W/ IMPLANT  2013 rod #1//  2014  rod 2   S1 -- T10  (rod #1)//   S1 -- T4 (rod #2)  . THORACIC FUSION  03-13-2013   REMOVAL HARDWARE/  BONE GRAFT FUSION T10//  REVISION OF RODS  . TOTAL KNEE ARTHROPLASTY Left 12-14-2005  . UVULOPALATOPHARYNGOPLASTY  04-26-2006   w/  TONSILLECTOMY/  TURBINATE REDUCTIONS/  BILATERAL ANTERIOR ETHMOIDECTOMY   Social History   Social History Narrative  . Not on file   Immunization History  Administered Date(s) Administered  . Fluad Quad(high Dose 65+) 01/26/2019, 03/05/2020  . Influenza Whole 03/04/2012  . Influenza, High Dose Seasonal PF 03/25/2018  . Influenza,inj,Quad PF,6+ Mos 02/28/2013, 03/07/2014, 02/28/2015, 03/27/2016, 03/09/2017  . Pneumococcal Conjugate-13 09/10/2016  . Pneumococcal Polysaccharide-23 03/12/2006, 11/09/2011  . Unspecified SARS-COV-2 Vaccination 08/07/2019, 08/28/2019     Objective: Vital Signs: There were no vitals taken for this visit.   Physical Exam   Musculoskeletal Exam: ***  CDAI Exam: CDAI Score: -- Patient Global: --; Provider Global: -- Swollen: --; Tender: -- Joint Exam 04/09/2020   No joint exam has been documented for this visit   There is currently no information documented on the homunculus. Go to the Rheumatology activity and complete the homunculus joint exam.  Investigation: No additional findings.  Imaging: No results found.  Recent Labs: Lab Results  Component Value  Date   WBC 7.2 02/24/2020   HGB 12.7 02/24/2020   PLT 210 02/24/2020   NA 135 02/24/2020   K 3.6 02/24/2020   CL 98 02/24/2020   CO2 25 02/24/2020   GLUCOSE 99 02/24/2020   BUN 10 02/24/2020   CREATININE 0.74 02/24/2020   BILITOT 0.6 02/24/2020   ALKPHOS 83 02/24/2020   AST 15 02/24/2020   ALT 8 02/24/2020   PROT 6.8 02/24/2020   ALBUMIN 3.5 02/24/2020   CALCIUM 9.6 02/24/2020   GFRAA >60 02/24/2020   QFTBGOLD Indeterminate 08/14/2016   QFTBGOLDPLUS Negative 12/22/2018    Speciality Comments: Simponi Aria every 8 weeks started Jan 2018  TB gold negative 08/19/16  Procedures:  No procedures performed Allergies: Latex, Amlodipine besy-benazepril hcl, Augmentin [amoxicillin-pot clavulanate], Bextra [valdecoxib], Chlorhexidine, Lipitor [atorvastatin calcium], Sulfa antibiotics, and Zocor [simvastatin]   Assessment / Plan:     Visit Diagnoses: No diagnosis found.  Orders: No orders of the defined types were placed in this encounter.  No orders of the defined types were placed in this encounter.   Face-to-face time spent with patient was *** minutes. Greater than 50% of time was spent in counseling and coordination of care.  Follow-Up Instructions:  No follow-ups on file.   Earnestine Mealing, CMA  Note - This record has been created using Editor, commissioning.  Chart creation errors have been sought, but may not always  have been located. Such creation errors do not reflect on  the standard of medical care.

## 2020-04-02 ENCOUNTER — Ambulatory Visit: Payer: Medicare Other | Admitting: Family Medicine

## 2020-04-09 ENCOUNTER — Ambulatory Visit: Payer: Medicare Other | Admitting: Rheumatology

## 2020-04-09 DIAGNOSIS — Z8659 Personal history of other mental and behavioral disorders: Secondary | ICD-10-CM

## 2020-04-09 DIAGNOSIS — Z8719 Personal history of other diseases of the digestive system: Secondary | ICD-10-CM

## 2020-04-09 DIAGNOSIS — G2581 Restless legs syndrome: Secondary | ICD-10-CM

## 2020-04-09 DIAGNOSIS — R5383 Other fatigue: Secondary | ICD-10-CM

## 2020-04-09 DIAGNOSIS — M797 Fibromyalgia: Secondary | ICD-10-CM

## 2020-04-09 DIAGNOSIS — Z96652 Presence of left artificial knee joint: Secondary | ICD-10-CM

## 2020-04-09 DIAGNOSIS — L4050 Arthropathic psoriasis, unspecified: Secondary | ICD-10-CM

## 2020-04-09 DIAGNOSIS — Z8639 Personal history of other endocrine, nutritional and metabolic disease: Secondary | ICD-10-CM

## 2020-04-09 DIAGNOSIS — M5134 Other intervertebral disc degeneration, thoracic region: Secondary | ICD-10-CM

## 2020-04-09 DIAGNOSIS — M47816 Spondylosis without myelopathy or radiculopathy, lumbar region: Secondary | ICD-10-CM

## 2020-04-09 DIAGNOSIS — M1711 Unilateral primary osteoarthritis, right knee: Secondary | ICD-10-CM

## 2020-04-09 DIAGNOSIS — Z79899 Other long term (current) drug therapy: Secondary | ICD-10-CM

## 2020-04-09 DIAGNOSIS — Z8679 Personal history of other diseases of the circulatory system: Secondary | ICD-10-CM

## 2020-04-09 DIAGNOSIS — Z8669 Personal history of other diseases of the nervous system and sense organs: Secondary | ICD-10-CM

## 2020-04-09 DIAGNOSIS — M5030 Other cervical disc degeneration, unspecified cervical region: Secondary | ICD-10-CM

## 2020-04-09 DIAGNOSIS — L409 Psoriasis, unspecified: Secondary | ICD-10-CM

## 2020-04-09 DIAGNOSIS — F5101 Primary insomnia: Secondary | ICD-10-CM

## 2020-04-09 DIAGNOSIS — M19041 Primary osteoarthritis, right hand: Secondary | ICD-10-CM

## 2020-04-11 ENCOUNTER — Ambulatory Visit (INDEPENDENT_AMBULATORY_CARE_PROVIDER_SITE_OTHER): Payer: Medicare Other | Admitting: Family Medicine

## 2020-04-11 ENCOUNTER — Other Ambulatory Visit: Payer: Self-pay

## 2020-04-11 VITALS — BP 150/80 | HR 84 | Temp 98.6°F | Ht 64.0 in | Wt 198.0 lb

## 2020-04-11 DIAGNOSIS — R110 Nausea: Secondary | ICD-10-CM

## 2020-04-11 MED ORDER — METOCLOPRAMIDE HCL 10 MG PO TABS: 10.0000 mg | ORAL_TABLET | Freq: Three times a day (TID) | ORAL | 0 refills | Status: DC

## 2020-04-11 NOTE — Progress Notes (Signed)
Subjective:    Patient ID: Janet Peterson, female    DOB: Feb 21, 1951, 69 y.o.   MRN: [id number]  Abdominal Pain  Hypertension    6/21 Patient was recently admitted to the hospital with suicidal ideation.  She has since been discharged from behavioral health and requested a refill on her oxycodone.  However I wanted to see the patient first prior to refilling a controlled substance.  She is here today with her husband.  They both endorse that she is doing much better.  At the time, she was thinking about her son who has died.  She was having thoughts of wanting to overdose on her medication.  She states that she is no longer having those thoughts.  She is in a much better place.  Her psychiatrist increased her dose of fluvoxamine.  She is using Percocet at night to help her sleep and occasionally during the daytime if she walks a long distance.  She was started on vitamin D for vitamin D deficiency while in the hospital.  Vitamin D level was in the 20s.  At that time, my plan was: Patient states that she is doing better.  I am no longer concerned that she is actively suicidal.  I feel comfortable refilling her Percocet that she has been taking for quite some time.  Both she and her husband state that she is doing better with no concerns.  Therefore I will refill her Percocet, 60 tablets/month.  I will also refill the Roselyn Meier that she is taking as needed for migraines.  I recommended vitamin D 50,000 units weekly for 6 months and then recheck vitamin D level.  Also recommended that she decrease her Prilosec to 40 mg a day and if tolerating this in the future could try switching to Pepcid.  12/14/19 Blood pressure today is well controlled at 110/70. Patient is going on a 1 month vacation across the [place]. However her prescription will expire before she returns home from her vacation. She takes 1 Percocet a day due to pain in her joints and in her neck due to the psoriatic arthritis. She  is asking that I refill her prescription earlier so that she can take it with her. I would be happy to do that. She shows no evidence of abuse or diversion. She is also requesting a heel lift for her right shoe. 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 She benefits from a shoe lift on the right foot to help correct this which helps with hip pain and lower back pain   02/19/20 Patient states her symptoms began approximately 15 days ago.  She was driving down the road and suddenly became extremely nauseated.  Her husband had to pull over and she vomited approximately 6 times.  This occurred suddenly and without warning.  She then felt better however a few days later she started developing dark green stool.  She states that she was also having left-sided abdominal pain.  The pain is sharp.  Tends to be located on the left side.  She denies any dysuria or urgency or frequency.  She denies any hematuria.  She denies any melena or hematochezia.  She denies any reflux-like symptoms.  She denies any heartburn.  She does report constipation.  In fact the first week that the nausea and pain was occurring, the patient states that she was constipated and eventually had a taken Ex-Lax.  She  now states that she is having a bowel movement every day however it has changed in color and is green.  Furthermore she reports nausea as soon as she eats.  At that time, my plan was: I believe the patient's pain is likely due to constipation.  I believe that she may be so constipated that is distending her intestines causing pain and contributing to nausea.  I will obtain an x-ray to evaluate further to confirm my suspicions.  Meanwhile check a CBC to look for leukocytosis along with a CMP.  Other potential causes would be diverticulitis given the location of the pain.  If there is an elevated white blood cell count or the pain intensifies or if the x-ray shows no constipation, I  will treat the patient empirically for possible diverticulitis.  Begin Linzess 145 mcg daily  03/05/20 The x-ray that I obtained showed a large amount of stool in the colon.  Therefore I recommended that she try Linzess.  She did but then she went to the hospital in early October due to abdominal pain.  In the hospital chest x-ray was unremarkable.  CT scan was unremarkable.  CMP and CBC were unremarkable.  Patient was diagnosed with irritable bowel symptoms and given Bentyl for abdominal pain.  She has stopped the Linzess.  She is back to having a bowel movement every 3 or 4 days.  The Bentyl does help with the abdominal pain.  Zofran also helps with the abdominal pain and nausea and she would like to have a prescription for the Zofran.  At that time, my plan was: I believe her abdominal pain is due to a combination of constipation and irritable bowel.  I recommended that she stay on Linzess 145 mcg daily to try to have a bowel movement every day to help prevent the abdominal pain.  She can also certainly use Bentyl for intestinal spasms.  I encouraged her to eat more fiber and drink more water.  I will refill her Zofran.  03/22/20 Patient had to discontinue the Linzess because she felt like it was making her nausea worse.  Since I last saw her she states that the abdominal pains have subsided.  However she does report epigastric discomfort especially after she eats.  She reports nausea after eating.  She is taking the Zofran but she states it takes 2 hours for the Zofran to take effect.  She also reports having a difficult time sleeping.  She states that she stays awake at night unable to fall asleep.  She denies any melena or hematochezia.  She denies any fever or chills.  She denies any vomiting.  She had an EGD in May that revealed a hiatal hernia but was otherwise normal.  She had a colonoscopy in May as well.  She had a CT scan in October that revealed no explanation for her pain.  Therefore I believe  a lot of her symptoms could be related to irritable bowel plus medication side effects.  Of note she is taking Aleve every day for her back pain.  This could potentially be causing gastritis despite the fact she is on omeprazole.  At that time, my plan was: I believe the majority of her symptoms are IBS.  Therefore we need to focus on finding ways to help manage her symptoms.  I think the Aleve could be contributing some to her dyspepsia.  Therefore I recommended that she discontinue Aleve altogether and allow 2 weeks to see if the irritation in  her stomach will improve.  I also recommended that we try premedicating with Phenergan rather than waiting until she becomes nauseated and then taking Zofran.  I chose Phenergan because I am hoping that it will help her sleep better at night.  I recommended that she premedicate with Phenergan at night prior to bed.  Hopefully this will help her fall asleep easier and not be habit-forming.  I hope that this will also be still present somewhat when she wakes up in the morning so that she is not as nauseated in the morning.  She can then take a Phenergan again during the daytime to help with nausea prior to lunch and dinner.  Therefore we will schedule the Phenergan twice daily and see if this helps settle her stomach.  She has an appointment to follow-up with her gastroenterologist in December hopefully we can help alleviate some of her symptoms prior to that.   04/11/20 Unfortunately, the patient has seen no improvement since I saw her last.  She is not having abdominal pain but she still continues to have nausea.  She states that she can eat 5 or 6 bites and she instantly becomes full and feels nauseated.  She has been staying off all NSAIDs and this has not helped.  Phenergan twice a day has not helped as well.  The Bentyl has not helped.  She also had a normal abdominal ultrasound in April.  Therefore work-up today includes a normal abdominal ultrasound in April, and  EGD in colonoscopy in May, CT scan earlier this fall none of which have shown an explanation.  At the present time her biggest symptoms are nausea and early satiety.  She denies any severe fever although she does have night sweats. Wt Readings from Last 3 Encounters:  04/11/20 198 lb (89.8 kg)  03/22/20 199 lb (90.3 kg)  03/05/20 196 lb (88.9 kg)    Past Medical History:  Diagnosis Date  . Ankylosing spondylitis ([place])   . Anxiety and depression   . Depression   . Fibromyalgia   . GERD (gastroesophageal reflux disease)   . History of basal cell carcinoma excision    FACE, [place]  . History of hiatal hernia   . History of thrush   . Hyperlipidemia   . Hypertension   . Insomnia   . Left breast mass   . OCD (obsessive compulsive disorder)   . OSA (obstructive sleep apnea)    MILD AND NO CPAP SINCE SURGERY IN 2007  . Osteopenia   . PONV (postoperative nausea and vomiting)   . Psoriatic arthritis ([place])   . PVC (premature ventricular contraction)   . Rheumatoid arthritis [facility])    Past Surgical History:  Procedure Laterality Date  . BIOPSY  09/29/2019   Procedure: BIOPSY;  Surgeon: Ronnette Juniper, MD;  Location: [facility];  Service: Gastroenterology;;  . BREAST LUMPECTOMY WITH RADIOACTIVE SEED LOCALIZATION Left 05/04/2018   Procedure: LEFT BREAST LUMPECTOMY WITH RADIOACTIVE SEED LOCALIZATION AND LEFT BREAST NIPPLE BIOPSY;  Surgeon: Jovita Kussmaul, MD;  Location: [facility];  Service: General;  Laterality: Left;  . BREAST SURGERY  1975   lumpectomy-- benign  . BUNIONECTOMY  1991  . CATARACT EXTRACTION W/ INTRAOCULAR LENS  IMPLANT, BILATERAL  2000  . CERVICAL FUSION  March 2013   C5 -- C7  . CESAREAN SECTION  1985  . COLONOSCOPY WITH PROPOFOL N/A 09/29/2019   Procedure: COLONOSCOPY WITH PROPOFOL;  Surgeon: Ronnette Juniper, MD;  Location:  [facility];  Service: Gastroenterology;  Laterality: N/A;  . DISTAL INTERPHALANGEAL JOINT FUSION Right 03/14/2015   Procedure:  RIGHT LONG FINGER DISTAL INTERPHALANGEAL JOINT ARTHRODESIS;  Surgeon: Iran Planas, MD;  Location: [facility];  Service: Orthopedics;  Laterality: Right;  . ESOPHAGOGASTRODUODENOSCOPY (EGD) WITH PROPOFOL N/A 09/29/2019   Procedure: ESOPHAGOGASTRODUODENOSCOPY (EGD) WITH PROPOFOL;  Surgeon: Ronnette Juniper, MD;  Location: [facility];  Service: Gastroenterology;  Laterality: N/A;  . EYE SURGERY  1995   rk (laser surgery), semi cornea transplant, detacted retina,  fluid removal  . KNEE ARTHROSCOPY Left 03-03-2004  . POLYPECTOMY  09/29/2019   Procedure: POLYPECTOMY;  Surgeon: Ronnette Juniper, MD;  Location: [facility];  Service: Gastroenterology;;  . POSTERIOR VITRECTOMY RIGHT EYE AND LASER   10-27-1999  . SHOULDER SURGERY Right 1997  . SPINAL FIXATION SURGERY W/ IMPLANT  2013 rod #1//  2014  rod 2   S1 -- T10  (rod #1)//   S1 -- T4 (rod #2)  . THORACIC FUSION  03-13-2013   REMOVAL HARDWARE/  BONE GRAFT FUSION T10//  REVISION OF RODS  . TOTAL KNEE ARTHROPLASTY Left 12-14-2005  . UVULOPALATOPHARYNGOPLASTY  04-26-2006   w/  TONSILLECTOMY/  TURBINATE REDUCTIONS/  BILATERAL ANTERIOR ETHMOIDECTOMY   Current Outpatient Medications on File Prior to Visit  Medication Sig Dispense Refill  . buPROPion (WELLBUTRIN XL) 300 MG 24 hr tablet Take 300 mg by mouth every morning.  1  . clonazePAM (KLONOPIN) 1 MG tablet TAKE 1 TABLET BY MOUTH EVERY DAY AS NEEDED FOR ANXIETY 30 tablet 1  . dicyclomine (BENTYL) 20 MG tablet Take 1 tablet (20 mg total) by mouth 3 (three) times daily before meals. 40 tablet 0  . ezetimibe (ZETIA) 10 MG tablet TAKE 1 TABLET BY MOUTH EVERY DAY (Patient taking differently: Take 10 mg by mouth daily after breakfast. ) 90 tablet 3  . fluticasone (FLONASE) 50 MCG/ACT nasal spray Place 2 sprays into both nostrils daily as needed for allergies.     . fluvoxaMINE (LUVOX) 100 MG tablet Take 125 mg by mouth in the morning, at noon, and at bedtime.     Marland Kitchen leflunomide (ARAVA) 20 MG tablet  TAKE 1 TABLET BY MOUTH EVERY DAY 90 tablet 0  . metoprolol succinate (TOPROL-XL) 50 MG 24 hr tablet TAKE 1 TABLET BY MOUTH ONCE DAILY FOLLOWING A MEAL 90 tablet 3  . omeprazole (PRILOSEC) 40 MG capsule TAKE 1 CAPSULE BY MOUTH EVERY MORNING AND AT BEDTIME 180 capsule 1  . ondansetron (ZOFRAN) 4 MG tablet Take 1 tablet (4 mg total) by mouth every 8 (eight) hours as needed. 30 tablet 0  . oxyCODONE-acetaminophen (PERCOCET) 5-325 MG tablet Take 1 tablet by mouth at bedtime. 60 tablet 0  . Probiotic Product (PROBIOTIC PO) Take 1 tablet by mouth daily after breakfast.    . promethazine (PHENERGAN) 25 MG tablet Take 1 tablet (25 mg total) by mouth 2 (two) times daily as needed for nausea or vomiting. 60 tablet 0  . tiZANidine (ZANAFLEX) 4 MG tablet TAKE 1 TABLET (4 MG TOTAL) BY MOUTH EVERY 6 (SIX) HOURS AS NEEDED FOR MUSCLE SPASMS. 30 tablet 0  . valsartan (DIOVAN) 320 MG tablet TAKE 1 TABLET BY MOUTH EVERY DAY 90 tablet 3  . Vitamin D, Ergocalciferol, (DRISDOL) 1.25 MG (50000 UNIT) CAPS capsule Take 1 capsule (50,000 Units total) by mouth every 7 (seven) days. 5 capsule 5   No current facility-administered medications on file prior to visit.   Allergies  Allergen Reactions  .  Latex Rash    And Mouth sores  . Amlodipine Besy-Benazepril Hcl Other (See Comments)    COUGH  . Augmentin [Amoxicillin-Pot Clavulanate] Diarrhea and Nausea And Vomiting  . Bextra [Valdecoxib] Diarrhea and Nausea And Vomiting    REFLUX  . Chlorhexidine     rash  . Lipitor [Atorvastatin Calcium] Other (See Comments)    MYALGIAS  . Sulfa Antibiotics Nausea And Vomiting    CRAMPING  . Zocor [Simvastatin] Other (See Comments)    MYALGIA   Social History   Socioeconomic History  . Marital status: Married    Spouse name: Not on file  . Number of children: Not on file  . Years of education: Not on file  . Highest education level: Not on file  Occupational History  . Not on file  Tobacco Use  . Smoking status: Former  Smoker    Packs/day: 0.50    Years: 10.00    Pack years: 5.00    Types: Cigarettes    Quit date: 03/07/1995    Years since quitting: 25.1  . Smokeless tobacco: Never Used  Vaping Use  . Vaping Use: Never used  Substance and Sexual Activity  . Alcohol use: Yes    Comment: rare  . Drug use: Never  . Sexual activity: Not on file  Other Topics Concern  . Not on file  Social History Narrative  . Not on file   Social Determinants of Health   Financial Resource Strain:   . Difficulty of Paying Living Expenses: Not on file  Food Insecurity:   . Worried About Charity fundraiser in the Last Year: Not on file  . Ran Out of Food in the Last Year: Not on file  Transportation Needs:   . Lack of Transportation (Medical): Not on file  . Lack of Transportation (Non-Medical): Not on file  Physical Activity:   . Days of Exercise per Week: Not on file  . Minutes of Exercise per Session: Not on file  Stress:   . Feeling of Stress : Not on file  Social Connections:   . Frequency of Communication with Friends and Family: Not on file  . Frequency of Social Gatherings with Friends and Family: Not on file  . Attends Religious Services: Not on file  . Active Member of Clubs or Organizations: Not on file  . Attends Archivist Meetings: Not on file  . Marital Status: Not on file  Intimate Partner Violence:   . Fear of Current or Ex-Partner: Not on file  . Emotionally Abused: Not on file  . Physically Abused: Not on file  . Sexually Abused: Not on file    Review of Systems  Gastrointestinal: Positive for abdominal pain.  All other systems reviewed and are negative.      Objective:   Physical Exam Vitals reviewed.  Constitutional:      General: She is not in acute distress.    Appearance: Normal appearance. She is normal weight. She is not ill-appearing or toxic-appearing.  Cardiovascular:     Rate and Rhythm: Normal rate and regular rhythm.     Heart sounds: Normal heart  sounds. No murmur heard.  No friction rub. No gallop.   Pulmonary:     Effort: Pulmonary effort is normal. No respiratory distress.     Breath sounds: Normal breath sounds. No wheezing, rhonchi or rales.  Abdominal:     General: Abdomen is flat. Bowel sounds are normal. There is no distension.  Palpations: Abdomen is soft.     Tenderness: There is no abdominal tenderness.  Musculoskeletal:     Right lower leg: No edema.     Left lower leg: No edema.  Neurological:     Mental Status: She is alert.           Assessment & Plan:  Nausea - Plan: H. pylori breath test Given her dyspepsia, I will screen the patient for H pylori.  This has not been done yet.  Next on the differential diagnosis would be gastroparesis versus biliary dyskinesia.  She has had a normal right upper quadrant ultrasound but she has not had a HIDA scan.  I will empirically treat the patient with Reglan 10 mg before meals over the weekend to see if her symptoms improve.  If they do, I would recommend performing a gastric emptying test.  If they do not, I will try to expedite her GI consultation.  She also started Arava around the same time that symptoms began.  There is a 10% chance this can cause similar symptoms to what she is experiencing.  Therefore I recommended that she reach out to her rheumatologist and touch base with her to see if she feels like this could be playing any role.

## 2020-04-12 LAB — H. PYLORI BREATH TEST

## 2020-04-12 NOTE — Progress Notes (Signed)
Office Visit Note  Patient: Janet Peterson             Date of Birth: 05/30/1950           MRN: [id number]             PCP: Janet Frizzle, MD Referring: Janet Frizzle, MD Visit Date: 04/25/2020 Occupation: @GUAROCC @  Subjective:  Right CMC joint pain   History of Present Illness: Janet Peterson is a 69 y.o. female  With history of psoriatic arthritis, osteoarthritis, DDD, and fibromyalgia.  She is taking arava 20 mg by mouth daily.  She is tolerating Arava without any side effects.  She has found her able to be more effective than Simponi Aria infusions were in the past.  She denies any recent infections.  She states that her psoriatic arthritis has been well controlled recently.  She states that her knee joint pain has improved significantly.  She has been experiencing increased pain in the right [facility] joint.  She states that she has been waking up several times per night with discomfort in her right thumb.  She denies any joint swelling.  She states that she has been applying Salonpas topically for symptomatic relief.  She denies any Achilles tendinitis or plantar fasciitis at this time.  She denies any SI joint pain.  She has occasional small patches of psoriasis on her face and uses a topical agent prescribed by Janet Peterson.  Patient reports that she continues to have some tender points due to fibromyalgia but overall her fibromyalgia has been well controlled.  Patient reports that over the past 8 months she has been experiencing increased shortness of breath on exertion.  She has also noticed an elevation in her blood pressure despite taking her insert hypertensive medications as prescribed.  She has been seen by her PCP several times to discuss the elevation in her blood pressure.  She states that she has not seen her cardiologist Dr. Martinique recently.   Activities of Daily Living:  Patient reports morning stiffness for 1  hour.   Patient Reports nocturnal pain.  Difficulty  dressing/grooming: Reports Difficulty climbing stairs: Reports Difficulty getting out of chair: Reports Difficulty using hands for taps, buttons, cutlery, and/or writing: Reports  Review of Systems  Constitutional: Negative for fatigue.  HENT: Positive for mouth sores and mouth dryness. Negative for nose dryness.   Eyes: Positive for visual disturbance. Negative for pain and dryness.  Respiratory: Positive for shortness of breath. Negative for cough, hemoptysis and difficulty breathing.   Cardiovascular: Positive for hypertension. Negative for palpitations and swelling in legs/feet.  Gastrointestinal: Positive for constipation and diarrhea. Negative for blood in stool.  Endocrine: Negative for increased urination.  Genitourinary: Negative for painful urination.  Musculoskeletal: Positive for arthralgias, joint pain, myalgias, muscle weakness, morning stiffness, muscle tenderness and myalgias. Negative for joint swelling.  Skin: Negative for color change, pallor, rash, hair loss, nodules/bumps, skin tightness, ulcers and sensitivity to sunlight.  Allergic/Immunologic: Negative for susceptible to infections.  Neurological: Positive for headaches. Negative for dizziness.  Hematological: Negative for swollen glands.  Psychiatric/Behavioral: Positive for depressed mood and sleep disturbance. The patient is nervous/anxious.     PMFS History:  Patient Active Problem List   Diagnosis Date Noted  . C. difficile colitis 09/29/2019  . Colitis 09/28/2019  . Anxiety and depression 09/28/2019  . DDD (degenerative disc disease), cervical s/p fusion 11/12/2016  . H/O total knee replacement, left 05/06/2016  . DDD lumbar spine status  post fusion 05/06/2016  . Psoriasis 05/05/2016  . High risk medication use 05/05/2016  . Cervical post-laminectomy syndrome 12/09/2015  . Thoracic postlaminectomy syndrome 12/09/2015  . Psoriatic arthritis (Janet Peterson) 08/23/2012  . Osteopenia 08/23/2012  . HLD  (hyperlipidemia) 08/23/2012  . RLS (restless legs syndrome) 08/23/2012  . Insomnia 08/23/2012  . Fibromyalgia syndrome 08/12/2012  . Low back pain 08/12/2012  . Syncope   . PVC (premature ventricular contraction)   . OCD (obsessive compulsive disorder)   . GERD (gastroesophageal reflux disease)   . Hypertension     Past Medical History:  Diagnosis Date  . Ankylosing spondylitis ([place])   . Anxiety and depression   . Depression   . Fibromyalgia   . GERD (gastroesophageal reflux disease)   . Hiatal hernia    per patient, dx by GI   . History of basal cell carcinoma excision    FACE, [place]  . History of hiatal hernia   . History of thrush   . Hyperlipidemia   . Hypertension   . Insomnia   . Left breast mass   . OCD (obsessive compulsive disorder)   . OSA (obstructive sleep apnea)    MILD AND NO CPAP SINCE SURGERY IN 2007  . Osteopenia   . PONV (postoperative nausea and vomiting)   . Psoriatic arthritis ([place])   . PVC (premature ventricular contraction)   . Rheumatoid arthritis ([place])     Family History  Problem Relation Age of Onset  . Diabetes Mother   . Heart disease Mother   . Diabetes Father   . Anuerysm Father   . Diabetes Brother   . Heart disease Brother   . Diabetes Brother   . Heart disease Brother    Past Surgical History:  Procedure Laterality Date  . BIOPSY  09/29/2019   Procedure: BIOPSY;  Surgeon: Ronnette Juniper, MD;  Location: [facility];  Service: Gastroenterology;;  . BREAST LUMPECTOMY WITH RADIOACTIVE SEED LOCALIZATION Left 05/04/2018   Procedure: LEFT BREAST LUMPECTOMY WITH RADIOACTIVE SEED LOCALIZATION AND LEFT BREAST NIPPLE BIOPSY;  Surgeon: Jovita Kussmaul, MD;  Location: [place];  Service: General;  Laterality: Left;  . BREAST SURGERY  1975   lumpectomy-- benign  . BUNIONECTOMY  1991  . CATARACT EXTRACTION W/ INTRAOCULAR LENS  IMPLANT, BILATERAL  2000  . CERVICAL FUSION  March 2013   C5 -- C7  . CESAREAN SECTION  1985   . COLONOSCOPY WITH PROPOFOL N/A 09/29/2019   Procedure: COLONOSCOPY WITH PROPOFOL;  Surgeon: Ronnette Juniper, MD;  Location: [facility];  Service: Gastroenterology;  Laterality: N/A;  . DISTAL INTERPHALANGEAL JOINT FUSION Right 03/14/2015   Procedure: RIGHT LONG FINGER DISTAL INTERPHALANGEAL JOINT ARTHRODESIS;  Surgeon: Iran Planas, MD;  Location: [place];  Service: Orthopedics;  Laterality: Right;  . ESOPHAGOGASTRODUODENOSCOPY (EGD) WITH PROPOFOL N/A 09/29/2019   Procedure: ESOPHAGOGASTRODUODENOSCOPY (EGD) WITH PROPOFOL;  Surgeon: Ronnette Juniper, MD;  Location: [facility];  Service: Gastroenterology;  Laterality: N/A;  . EYE SURGERY  1995   rk (laser surgery), semi cornea transplant, detacted retina,  fluid removal  . KNEE ARTHROSCOPY Left 03-03-2004  . POLYPECTOMY  09/29/2019   Procedure: POLYPECTOMY;  Surgeon: Ronnette Juniper, MD;  Location: [facility];  Service: Gastroenterology;;  . POSTERIOR VITRECTOMY RIGHT EYE AND LASER   10-27-1999  . SHOULDER SURGERY Right 1997  . SPINAL FIXATION SURGERY W/ IMPLANT  2013 rod #1//  2014  rod 2   S1 -- T10  (rod #1)//  S1 -- T4 (rod #2)  . THORACIC FUSION  03-13-2013   REMOVAL HARDWARE/  BONE GRAFT FUSION T10//  REVISION OF RODS  . TOTAL KNEE ARTHROPLASTY Left 12-14-2005  . UVULOPALATOPHARYNGOPLASTY  04-26-2006   w/  TONSILLECTOMY/  TURBINATE REDUCTIONS/  BILATERAL ANTERIOR ETHMOIDECTOMY   Social History   Social History Narrative  . Not on file   Immunization History  Administered Date(s) Administered  . Fluad Quad(high Dose 65+) 01/26/2019, 03/05/2020  . Influenza Whole 03/04/2012  . Influenza, High Dose Seasonal PF 03/25/2018  . Influenza,inj,Quad PF,6+ Mos 02/28/2013, 03/07/2014, 02/28/2015, 03/27/2016, 03/09/2017  . Pneumococcal Conjugate-13 09/10/2016  . Pneumococcal Polysaccharide-23 03/12/2006, 11/09/2011  . Unspecified SARS-COV-2 Vaccination 08/07/2019, 08/28/2019, 03/08/2020     Objective: Vital Signs: BP (!)  160/110 (BP Location: Left Arm, Patient Position: Sitting, Cuff Size: Normal)   Pulse 84   Resp 15   Ht 5\' 4"  (1.626 m)   Wt 199 lb 6.4 oz (90.4 kg)   BMI 34.23 kg/m    Physical Exam Vitals and nursing note reviewed.  Constitutional:      Appearance: She is well-developed.  HENT:     Head: Normocephalic and atraumatic.  Eyes:     Conjunctiva/sclera: Conjunctivae normal.  Pulmonary:     Effort: Pulmonary effort is normal.  Abdominal:     Palpations: Abdomen is soft.  Musculoskeletal:     Cervical back: Normal range of motion.  Skin:    General: Skin is warm and dry.     Capillary Refill: Capillary refill takes less than 2 seconds.  Neurological:     Mental Status: She is alert and oriented to person, place, and time.  Psychiatric:        Behavior: Behavior normal.      Musculoskeletal Exam: C-spine has slightly limited ROM.  Postural thoracic kyphosis noted.  Painful lumbar spine ROM. No midline spinal tenderness or SI joint tenderness noted.  Shoulder joints, elbow joints, wrist joints, MCPs, PIPs, DIPs have good range of motion with no synovitis.  She has PIP and DIP thickening consistent with osteoarthritis of both hands.  Tenderness over the right CMC joint noted.  She is able to make a complete fist bilaterally.  Mild warmth of right knee joint.  Left knee replacement has good ROM with no discomfort. Ankle joints have good range of motion with no tenderness or inflammation.  CDAI Exam: CDAI Score: -- Patient Global: --; Provider Global: -- Swollen: --; Tender: -- Joint Exam 04/25/2020   No joint exam has been documented for this visit   There is currently no information documented on the homunculus. Go to the Rheumatology activity and complete the homunculus joint exam.  Investigation: No additional findings.  Imaging: No results found.  Recent Labs: Lab Results  Component Value Date   WBC 7.2 02/24/2020   HGB 12.7 02/24/2020   PLT 210 02/24/2020   NA 135  02/24/2020   K 3.6 02/24/2020   CL 98 02/24/2020   CO2 25 02/24/2020   GLUCOSE 99 02/24/2020   BUN 10 02/24/2020   CREATININE 0.74 02/24/2020   BILITOT 0.6 02/24/2020   ALKPHOS 83 02/24/2020   AST 15 02/24/2020   ALT 8 02/24/2020   PROT 6.8 02/24/2020   ALBUMIN 3.5 02/24/2020   CALCIUM 9.6 02/24/2020   GFRAA >60 02/24/2020   QFTBGOLD Indeterminate 08/14/2016   QFTBGOLDPLUS Negative 12/22/2018    Speciality Comments: Simponi Aria every 8 weeks started Jan 2018  TB gold negative 08/19/16  Procedures:  No  procedures performed Allergies: Latex, Amlodipine besy-benazepril hcl, Augmentin [amoxicillin-pot clavulanate], Bextra [valdecoxib], Chlorhexidine, Lipitor [atorvastatin calcium], Sulfa antibiotics, and Zocor [simvastatin]   Assessment / Plan:     Visit Diagnoses: Psoriatic arthritis ([place]): She has no synovitis or dactylitis on exam.  She has not had any recent psoriatic arthritis flares.  She is clinically doing well on Arava 20 mg 1 tablet by mouth daily.  She is tolerating Arava without any side effects.  According to the patient she has found Arava to be more effective than Simponi Aria infusions were in the past.  The discomfort in her right knee joint has improved significantly since having Visco gel injections in March 2021.  She is not experiencing any Achilles tendinitis or plantar fasciitis at this time.  She has no SI joint tenderness on examination today.  She has occasional small patches of psoriasis on her face and is treated with topical agents by Janet Peterson.  She will continue on Arava 20 mg 1 tablet by mouth daily.  A refill sent to the pharmacy today.  She was advised to notify us if she develops increased joint pain or joint swelling.  She will follow-up in the office in 5 months.  Psoriasis: She has occasional small patches of psoriasis on her face and uses a topical agent prescribed by Janet Peterson.  High risk medication use -Arava 20 mg 1 tablet by mouth daily.   Previously was on Simponi Aria IV infusion 2 mg/kg every 8 weeks-patient discontinued in March 2021 due to C. difficile infection.  CBC and CMP were within normal limits on 02/24/2020.  She will be due to update lab work in January and every 3 months to monitor for drug toxicity.  Standing orders for CBC and CMP were placed today.- Plan: COMPLETE METABOLIC PANEL WITH GFR, CBC with Differential/Platelet  Primary osteoarthritis of both hands: She has PIP and DIP thickening consistent with osteoarthritis of both hands.  She has tenderness over the right [facility] joint on examination today.  She has been experiencing nocturnal pain and significant discomfort throughout the day in her right [facility] joint.  Different treatment options were discussed today in detail.  She was given a prescription for right [facility] joint brace.  We also discussed the use of arthritis compression gloves.  We encouraged the use of Voltaren gel which she can apply topically as needed.  Once her blood pressure is well controlled and if her right CMC joint pain persists or worsens she can return for an ultrasound-guided cortisone injection in the future. We discussed the importance of joint protection and muscle strengthening.  She was given a handout of hand exercises to perform.  Primary osteoarthritis of right knee: The discomfort in her right knee joint has improved.  She underwent visco gel injections in the right knee joint in march 2021, which have provided significant pain relief.   H/O total knee replacement, left: Doing well.  She has good range of motion with no discomfort.  DDD (degenerative disc disease), cervical s/p fusion: She has limited range of motion with discomfort in the C-spine.  No symptoms of radiculopathy at this time.  DDD (degenerative disc disease), thoracic: No midline spinal tenderness.  DDD lumbar spine status post fusion: Chronic pain.  She takes oxycodone as needed for pain relief.  Fibromyalgia: She has  occasional tender points due to underlying fibromyalgia.  Overall she feels as though her fibromyalgia has been well controlled.  Other fatigue: Chronic   Primary insomnia: She has  been experiencing interrupted sleep at night due to nocturnal pain in her right CMC joint.  History of hypertension: Her blood pressure was significantly elevated today in the office.  Manual blood pressure was 160/110.  She has been taking her antihypertensive medications as prescribed.  She has also been following along with her PCP for the management of hypertension.  She has not seen her cardiologist Dr. Martinique recently.  We strongly encouraged her to be evaluated in the emergency department she declined.  We attempted to contact her PCP to schedule appointment to be seen urgently.  The patient was also advised to call her PCP as soon as possible to schedule an urgent appointment.  Blood glucose abnormal -Patient requested a future order for hemoglobin A1c to be placed in order to be drawn with upcoming lab work.  Plan: HgB A1c  Other medical conditions are listed as follows:  History of OCD (obsessive compulsive disorder)  History of gastroesophageal reflux (GERD)  RLS (restless legs syndrome)  History of migraine  History of hyperlipidemia    Orders: Orders Placed This Encounter  Procedures  . HgB A1c  . COMPLETE METABOLIC PANEL WITH GFR  . CBC with Differential/Platelet   Meds ordered this encounter  Medications  . leflunomide (ARAVA) 20 MG tablet    Sig: Take 1 tablet (20 mg total) by mouth daily.    Dispense:  90 tablet    Refill:  0     Follow-Up Instructions: Return in about 5 months (around 09/23/2020) for Psoriatic arthritis, Osteoarthritis, DDD, Fibromyalgia.   Ofilia Neas, PA-C  Note - This record has been created using Dragon software.  Chart creation errors have been sought, but may not always  have been located. Such creation errors do not reflect on  the standard of  medical care.

## 2020-04-15 ENCOUNTER — Telehealth: Payer: Self-pay

## 2020-04-15 ENCOUNTER — Other Ambulatory Visit: Payer: Self-pay

## 2020-04-15 MED ORDER — OXYCODONE-ACETAMINOPHEN 5-325 MG PO TABS
1.0000 | ORAL_TABLET | Freq: Every day | ORAL | 0 refills | Status: DC
Start: 2020-04-15 — End: 2020-06-28

## 2020-04-15 NOTE — Telephone Encounter (Signed)
Is this an FYI or is there a question that patient needs answered?

## 2020-04-15 NOTE — Telephone Encounter (Signed)
Mrs. Safran is going to start back with her Wellbutrin, her therapist had taken her off, but she was having reactions with the rx therapist had her on ([place])

## 2020-04-16 NOTE — Telephone Encounter (Signed)
It was a [place] !

## 2020-04-17 ENCOUNTER — Other Ambulatory Visit: Payer: Self-pay | Admitting: Family Medicine

## 2020-04-23 NOTE — Telephone Encounter (Signed)
Pt call for rx refill

## 2020-04-24 ENCOUNTER — Other Ambulatory Visit (HOSPITAL_COMMUNITY): Payer: Self-pay | Admitting: Gastroenterology

## 2020-04-24 DIAGNOSIS — R112 Nausea with vomiting, unspecified: Secondary | ICD-10-CM

## 2020-04-25 ENCOUNTER — Encounter (HOSPITAL_COMMUNITY): Payer: Self-pay | Admitting: Emergency Medicine

## 2020-04-25 ENCOUNTER — Ambulatory Visit (INDEPENDENT_AMBULATORY_CARE_PROVIDER_SITE_OTHER): Payer: Medicare Other | Admitting: Physician Assistant

## 2020-04-25 ENCOUNTER — Other Ambulatory Visit: Payer: Self-pay

## 2020-04-25 ENCOUNTER — Emergency Department (HOSPITAL_COMMUNITY)
Admission: EM | Admit: 2020-04-25 | Discharge: 2020-04-26 | Disposition: A | Discharge status: Home / Self Care | Payer: Medicare Other | Attending: Emergency Medicine | Admitting: Emergency Medicine

## 2020-04-25 ENCOUNTER — Encounter: Payer: Self-pay | Admitting: Physician Assistant

## 2020-04-25 VITALS — BP 160/110 | HR 84 | Resp 15 | Ht 64.0 in | Wt 199.4 lb

## 2020-04-25 DIAGNOSIS — Z96652 Presence of left artificial knee joint: Secondary | ICD-10-CM

## 2020-04-25 DIAGNOSIS — R5383 Other fatigue: Secondary | ICD-10-CM

## 2020-04-25 DIAGNOSIS — Z79899 Other long term (current) drug therapy: Secondary | ICD-10-CM

## 2020-04-25 DIAGNOSIS — M19042 Primary osteoarthritis, left hand: Secondary | ICD-10-CM

## 2020-04-25 DIAGNOSIS — I10 Essential (primary) hypertension: Secondary | ICD-10-CM

## 2020-04-25 DIAGNOSIS — E871 Hypo-osmolality and hyponatremia: Secondary | ICD-10-CM | POA: Diagnosis not present

## 2020-04-25 DIAGNOSIS — F5101 Primary insomnia: Secondary | ICD-10-CM

## 2020-04-25 DIAGNOSIS — L409 Psoriasis, unspecified: Secondary | ICD-10-CM

## 2020-04-25 DIAGNOSIS — M1711 Unilateral primary osteoarthritis, right knee: Secondary | ICD-10-CM

## 2020-04-25 DIAGNOSIS — Z85828 Personal history of other malignant neoplasm of skin: Secondary | ICD-10-CM | POA: Insufficient documentation

## 2020-04-25 DIAGNOSIS — Z8659 Personal history of other mental and behavioral disorders: Secondary | ICD-10-CM

## 2020-04-25 DIAGNOSIS — L4050 Arthropathic psoriasis, unspecified: Secondary | ICD-10-CM | POA: Diagnosis not present

## 2020-04-25 DIAGNOSIS — Z87891 Personal history of nicotine dependence: Secondary | ICD-10-CM | POA: Diagnosis not present

## 2020-04-25 DIAGNOSIS — Z8639 Personal history of other endocrine, nutritional and metabolic disease: Secondary | ICD-10-CM

## 2020-04-25 DIAGNOSIS — D649 Anemia, unspecified: Secondary | ICD-10-CM | POA: Diagnosis not present

## 2020-04-25 DIAGNOSIS — M797 Fibromyalgia: Secondary | ICD-10-CM

## 2020-04-25 DIAGNOSIS — Z91040 Latex allergy status: Secondary | ICD-10-CM | POA: Insufficient documentation

## 2020-04-25 DIAGNOSIS — R7309 Other abnormal glucose: Secondary | ICD-10-CM

## 2020-04-25 DIAGNOSIS — G2581 Restless legs syndrome: Secondary | ICD-10-CM

## 2020-04-25 DIAGNOSIS — M19041 Primary osteoarthritis, right hand: Secondary | ICD-10-CM

## 2020-04-25 DIAGNOSIS — Z8669 Personal history of other diseases of the nervous system and sense organs: Secondary | ICD-10-CM

## 2020-04-25 DIAGNOSIS — M5134 Other intervertebral disc degeneration, thoracic region: Secondary | ICD-10-CM

## 2020-04-25 DIAGNOSIS — M5030 Other cervical disc degeneration, unspecified cervical region: Secondary | ICD-10-CM

## 2020-04-25 DIAGNOSIS — Z8719 Personal history of other diseases of the digestive system: Secondary | ICD-10-CM

## 2020-04-25 DIAGNOSIS — M47816 Spondylosis without myelopathy or radiculopathy, lumbar region: Secondary | ICD-10-CM

## 2020-04-25 DIAGNOSIS — Z8679 Personal history of other diseases of the circulatory system: Secondary | ICD-10-CM

## 2020-04-25 LAB — COMPREHENSIVE METABOLIC PANEL
ALT: 8 U/L (ref 0–44)
AST: 15 U/L (ref 15–41)
Albumin: 3.5 g/dL (ref 3.5–5.0)
Alkaline Phosphatase: 118 U/L (ref 38–126)
Anion gap: 10 (ref 5–15)
BUN: 8 mg/dL (ref 8–23)
CO2: 25 mmol/L (ref 22–32)
Calcium: 9.6 mg/dL (ref 8.9–10.3)
Chloride: 95 mmol/L — ABNORMAL LOW (ref 98–111)
Creatinine, Ser: 0.77 mg/dL (ref 0.44–1.00)
GFR, Estimated: >60 mL/min (ref >60)
Glucose, Bld: 133 mg/dL — ABNORMAL HIGH (ref 70–99)
Potassium: 3.9 mmol/L (ref 3.5–5.1)
Sodium: 130 mmol/L — ABNORMAL LOW (ref 135–145)
Total Bilirubin: 0.2 mg/dL — ABNORMAL LOW (ref 0.3–1.2)
Total Protein: 6.3 g/dL — ABNORMAL LOW (ref 6.5–8.1)

## 2020-04-25 LAB — CBC WITH DIFFERENTIAL/PLATELET
Abs Immature Granulocytes: 0.03 10*3/uL (ref 0.00–0.07)
Basophils Absolute: 0.1 10*3/uL (ref 0.0–0.1)
Basophils Relative: 1 %
Eosinophils Absolute: 0.1 10*3/uL (ref 0.0–0.5)
Eosinophils Relative: 2 %
HCT: 35.9 % — ABNORMAL LOW (ref 36.0–46.0)
Hemoglobin: 11.8 g/dL — ABNORMAL LOW (ref 12.0–15.0)
Immature Granulocytes: 0 %
Lymphocytes Relative: 33 %
Lymphs Abs: 2.5 10*3/uL (ref 0.7–4.0)
MCH: 30.7 pg (ref 26.0–34.0)
MCHC: 32.9 g/dL (ref 30.0–36.0)
MCV: 93.5 fL (ref 80.0–100.0)
Monocytes Absolute: 0.7 10*3/uL (ref 0.1–1.0)
Monocytes Relative: 9 %
Neutro Abs: 4.3 10*3/uL (ref 1.7–7.7)
Neutrophils Relative %: 55 %
Platelets: 220 10*3/uL (ref 150–400)
RBC: 3.84 MIL/uL — ABNORMAL LOW (ref 3.87–5.11)
RDW: 13.6 % (ref 11.5–15.5)
WBC: 7.7 10*3/uL (ref 4.0–10.5)
nRBC: 0 % (ref 0.0–0.2)

## 2020-04-25 MED ORDER — LEFLUNOMIDE 20 MG PO TABS
20.0000 mg | ORAL_TABLET | Freq: Every day | ORAL | 0 refills | Status: DC
Start: 2020-04-25 — End: 2020-10-22

## 2020-04-25 NOTE — Patient Instructions (Addendum)
Standing Labs We placed an order today for your standing lab work.   Please have your standing labs drawn in January and every 3 months   If possible, please have your labs drawn 2 weeks prior to your appointment so that the provider can discuss your results at your appointment.  We have open lab daily Monday through Thursday from 8:30-12:30 PM and 1:30-4:30 PM and Friday from 8:30-12:30 PM and 1:30-4:00 PM at the office of Dr. Bo Merino, [facility].   Please be advised, patients with office appointments requiring lab work will take precedents over walk-in lab work.  If possible, please come for your lab work on Monday and Friday afternoons, as you may experience shorter wait times. The office is located at [address], [place], [place], [place] [address] No appointment is necessary.   Labs are drawn by [facility]. Please bring your co-pay at the time of your lab draw.  You may receive a bill from [facility] for your lab work.  If you wish to have your labs drawn at another location, please call the office 24 hours in advance to send orders.  If you have any questions regarding directions or hours of operation,  please call [phone].   As a reminder, please drink plenty of water prior to coming for your lab work. Thanks!     Hand Exercises Hand exercises can be helpful for almost anyone. These exercises can strengthen the hands, improve flexibility and movement, and increase blood flow to the hands. These results can make work and daily tasks easier. Hand exercises can be especially helpful for people who have joint pain from arthritis or have nerve damage from overuse (carpal tunnel syndrome). These exercises can also help people who have injured a hand. Exercises Most of these hand exercises are gentle stretching and motion exercises. It is usually safe to do them often throughout the day. Warming up your hands before exercise may help to reduce stiffness. You can do  this with gentle massage or by placing your hands in warm water for 10-15 minutes. It is normal to feel some stretching, pulling, tightness, or mild discomfort as you begin new exercises. This will gradually improve. Stop an exercise right away if you feel sudden, severe pain or your pain gets worse. Ask your health care provider which exercises are best for you. Knuckle bend or "claw" fist 1. Stand or sit with your arm, hand, and all five fingers pointed straight up. Make sure to keep your wrist straight during the exercise. 2. Gently bend your fingers down toward your palm until the tips of your fingers are touching the top of your palm. Keep your big knuckle straight and just bend the small knuckles in your fingers. 3. Hold this position for __________ seconds. 4. Straighten (extend) your fingers back to the starting position. Repeat this exercise 5-10 times with each hand. Full finger fist 1. Stand or sit with your arm, hand, and all five fingers pointed straight up. Make sure to keep your wrist straight during the exercise. 2. Gently bend your fingers into your palm until the tips of your fingers are touching the middle of your palm. 3. Hold this position for __________ seconds. 4. Extend your fingers back to the starting position, stretching every joint fully. Repeat this exercise 5-10 times with each hand. Straight fist 1. Stand or sit with your arm, hand, and all five fingers pointed straight up. Make sure to keep your wrist straight during the exercise. 2.  Gently bend your fingers at the big knuckle, where your fingers meet your hand, and the middle knuckle. Keep the knuckle at the tips of your fingers straight and try to touch the bottom of your palm. 3. Hold this position for __________ seconds. 4. Extend your fingers back to the starting position, stretching every joint fully. Repeat this exercise 5-10 times with each hand. Tabletop 1. Stand or sit with your arm, hand, and all five  fingers pointed straight up. Make sure to keep your wrist straight during the exercise. 2. Gently bend your fingers at the big knuckle, where your fingers meet your hand, as far down as you can while keeping the small knuckles in your fingers straight. Think of forming a tabletop with your fingers. 3. Hold this position for __________ seconds. 4. Extend your fingers back to the starting position, stretching every joint fully. Repeat this exercise 5-10 times with each hand. Finger spread 1. Place your hand flat on a table with your palm facing down. Make sure your wrist stays straight as you do this exercise. 2. Spread your fingers and thumb apart from each other as far as you can until you feel a gentle stretch. Hold this position for __________ seconds. 3. Bring your fingers and thumb tight together again. Hold this position for __________ seconds. Repeat this exercise 5-10 times with each hand. Making circles 1. Stand or sit with your arm, hand, and all five fingers pointed straight up. Make sure to keep your wrist straight during the exercise. 2. Make a circle by touching the tip of your thumb to the tip of your index finger. 3. Hold for __________ seconds. Then open your hand wide. 4. Repeat this motion with your thumb and each finger on your hand. Repeat this exercise 5-10 times with each hand. Thumb motion 1. Sit with your forearm resting on a table and your wrist straight. Your thumb should be facing up toward the ceiling. Keep your fingers relaxed as you move your thumb. 2. Lift your thumb up as high as you can toward the ceiling. Hold for __________ seconds. 3. Bend your thumb across your palm as far as you can, reaching the tip of your thumb for the small finger (pinkie) side of your palm. Hold for __________ seconds. Repeat this exercise 5-10 times with each hand. Grip strengthening  1. Hold a stress ball or other soft ball in the middle of your hand. 2. Slowly increase the  pressure, squeezing the ball as much as you can without causing pain. Think of bringing the tips of your fingers into the middle of your palm. All of your finger joints should bend when doing this exercise. 3. Hold your squeeze for __________ seconds, then relax. Repeat this exercise 5-10 times with each hand. Contact a health care provider if:  Your hand pain or discomfort gets much worse when you do an exercise.  Your hand pain or discomfort does not improve within 2 hours after you exercise. If you have any of these problems, stop doing these exercises right away. Do not do them again unless your health care provider says that you can. Get help right away if:  You develop sudden, severe hand pain or swelling. If this happens, stop doing these exercises right away. Do not do them again unless your health care provider says that you can. This information is not intended to replace advice given to you by your health care provider. Make sure you discuss any questions you have with  your health care provider. Document Revised: 09/01/2018 Document Reviewed: 05/12/2018 Elsevier Patient Education  [place].

## 2020-04-25 NOTE — ED Triage Notes (Signed)
Patient reports elevated blood pressure BP= 160/110 today , with mild nausea , denies headache or dizziness.

## 2020-04-26 ENCOUNTER — Telehealth: Payer: Self-pay

## 2020-04-26 ENCOUNTER — Other Ambulatory Visit: Payer: Self-pay

## 2020-04-26 ENCOUNTER — Emergency Department (HOSPITAL_COMMUNITY): Payer: Medicare Other

## 2020-04-26 DIAGNOSIS — I10 Essential (primary) hypertension: Secondary | ICD-10-CM | POA: Diagnosis not present

## 2020-04-26 LAB — TROPONIN I (HIGH SENSITIVITY): Troponin I (High Sensitivity): 7 ng/L (ref <18)

## 2020-04-26 MED ORDER — HYDROCHLOROTHIAZIDE 25 MG PO TABS: 12.5000 mg | ORAL_TABLET | Freq: Every day | ORAL | 3 refills | Status: DC

## 2020-04-26 MED ORDER — OXYCODONE-ACETAMINOPHEN 5-325 MG PO TABS
1.0000 | ORAL_TABLET | Freq: Once | ORAL | Status: AC
  Administered 2020-04-26: 1 via ORAL
  Filled 2020-04-26: qty 1

## 2020-04-26 NOTE — Telephone Encounter (Signed)
Patient made aware of Providers intructions

## 2020-04-26 NOTE — Telephone Encounter (Signed)
Spoke with Mrs. [place] calling to check up on her, her BP numbers are still elevated 170/88 sent rx hctz 12.5 for Mrs. Frakes to pharmacy.

## 2020-04-26 NOTE — Telephone Encounter (Signed)
-----   Message from Susy Frizzle, MD sent at 04/26/2020  7:45 AM EST -----   ----- Message ----- From: Delora Fuel, MD Sent: 04/26/2020   3:45 AM EST To: Susy Frizzle, MD  Cletus Gash;  This pleasant lady was seen in the ED for elevated blood pressure. I am referring her back to you to make any adjustments in medication you feel are indicated.  Delora Fuel

## 2020-04-26 NOTE — ED Provider Notes (Addendum)
[facility] EMERGENCY DEPARTMENT Provider Note   CSN: [id number] Arrival date & time: 04/25/20  2141     History Chief Complaint  Patient presents with  . Hypertension    BP= 207/104    Janet Peterson is a 69 y.o. female.  HPI   Pt is 69 y/o female with a h/o ankylosing spondylitis, anxiety/depression, fibromyalgia, , GERD, HLD, HTN, OCD, OSA, who presents to the ED today for eval of HTN. States she was at her rheumatologist today and her BP was noted to be 160/110. States that she was told by her rheumatology PA that of her BP increased past a certain level she should come to the ED. She checked it at home and it was more elevated so she came here.   States she has had intermittent right sided chest pain that occurrs intermittently for the last 3 months. Pain feels like a pressure that lasts for about 5-10 minutes. The pain occurs randomly and is not associated with exertion. She has chronic SOB that has worsened over time. This has been going on for the last few years and she has been referred to pulm for this but has yet to f/u due to [place]. The chest pain does not seem to worsen the shortness of breath. She did have some associated nausea today though she states she has had intermittent nausea for at least 4 months that she thinks is due to her hiatal hernia. She does note she has been having headaches. States she has a h/o migraines but states the headaches have not been as bad as her prior migraines. Denies dizziness or lightheadedness.   Past Medical History:  Diagnosis Date  . Ankylosing spondylitis ([place])   . Anxiety and depression   . Depression   . Fibromyalgia   . GERD (gastroesophageal reflux disease)   . Hiatal hernia    per patient, dx by GI   . History of basal cell carcinoma excision    FACE, [place]  . History of hiatal hernia   . History of thrush   . Hyperlipidemia   . Hypertension   . Insomnia   . Left breast mass   . OCD  (obsessive compulsive disorder)   . OSA (obstructive sleep apnea)    MILD AND NO CPAP SINCE SURGERY IN 2007  . Osteopenia   . PONV (postoperative nausea and vomiting)   . Psoriatic arthritis ([place])   . PVC (premature ventricular contraction)   . Rheumatoid arthritis [facility])     Patient Active Problem List   Diagnosis Date Noted  . C. difficile colitis 09/29/2019  . Colitis 09/28/2019  . Anxiety and depression 09/28/2019  . DDD (degenerative disc disease), cervical s/p fusion 11/12/2016  . H/O total knee replacement, left 05/06/2016  . DDD lumbar spine status post fusion 05/06/2016  . Psoriasis 05/05/2016  . High risk medication use 05/05/2016  . Cervical post-laminectomy syndrome 12/09/2015  . Thoracic postlaminectomy syndrome 12/09/2015  . Psoriatic arthritis ([place]) 08/23/2012  . Osteopenia 08/23/2012  . HLD (hyperlipidemia) 08/23/2012  . RLS (restless legs syndrome) 08/23/2012  . Insomnia 08/23/2012  . Fibromyalgia syndrome 08/12/2012  . Low back pain 08/12/2012  . Syncope   . PVC (premature ventricular contraction)   . OCD (obsessive compulsive disorder)   . GERD (gastroesophageal reflux disease)   . Hypertension     Past Surgical History:  Procedure Laterality Date  . BIOPSY  09/29/2019   Procedure: BIOPSY;  Surgeon: Therisa Doyne,  Megan Salon, MD;  Location: [facility];  Service: Gastroenterology;;  . BREAST LUMPECTOMY WITH RADIOACTIVE SEED LOCALIZATION Left 05/04/2018   Procedure: LEFT BREAST LUMPECTOMY WITH RADIOACTIVE SEED LOCALIZATION AND LEFT BREAST NIPPLE BIOPSY;  Surgeon: Jovita Kussmaul, MD;  Location: [place];  Service: General;  Laterality: Left;  . BREAST SURGERY  1975   lumpectomy-- benign  . BUNIONECTOMY  1991  . CATARACT EXTRACTION W/ INTRAOCULAR LENS  IMPLANT, BILATERAL  2000  . CERVICAL FUSION  March 2013   C5 -- C7  . CESAREAN SECTION  1985  . COLONOSCOPY WITH PROPOFOL N/A 09/29/2019   Procedure: COLONOSCOPY WITH PROPOFOL;  Surgeon: Ronnette Juniper, MD;   Location: [facility];  Service: Gastroenterology;  Laterality: N/A;  . DISTAL INTERPHALANGEAL JOINT FUSION Right 03/14/2015   Procedure: RIGHT LONG FINGER DISTAL INTERPHALANGEAL JOINT ARTHRODESIS;  Surgeon: Iran Planas, MD;  Location: [place];  Service: Orthopedics;  Laterality: Right;  . ESOPHAGOGASTRODUODENOSCOPY (EGD) WITH PROPOFOL N/A 09/29/2019   Procedure: ESOPHAGOGASTRODUODENOSCOPY (EGD) WITH PROPOFOL;  Surgeon: Ronnette Juniper, MD;  Location: [facility];  Service: Gastroenterology;  Laterality: N/A;  . EYE SURGERY  1995   rk (laser surgery), semi cornea transplant, detacted retina,  fluid removal  . KNEE ARTHROSCOPY Left 03-03-2004  . POLYPECTOMY  09/29/2019   Procedure: POLYPECTOMY;  Surgeon: Ronnette Juniper, MD;  Location: [facility];  Service: Gastroenterology;;  . POSTERIOR VITRECTOMY RIGHT EYE AND LASER   10-27-1999  . SHOULDER SURGERY Right 1997  . SPINAL FIXATION SURGERY W/ IMPLANT  2013 rod #1//  2014  rod 2   S1 -- T10  (rod #1)//   S1 -- T4 (rod #2)  . THORACIC FUSION  03-13-2013   REMOVAL HARDWARE/  BONE GRAFT FUSION T10//  REVISION OF RODS  . TOTAL KNEE ARTHROPLASTY Left 12-14-2005  . UVULOPALATOPHARYNGOPLASTY  04-26-2006   w/  TONSILLECTOMY/  TURBINATE REDUCTIONS/  BILATERAL ANTERIOR ETHMOIDECTOMY     OB History   No obstetric history on file.     Family History  Problem Relation Age of Onset  . Diabetes Mother   . Heart disease Mother   . Diabetes Father   . Anuerysm Father   . Diabetes Brother   . Heart disease Brother   . Diabetes Brother   . Heart disease Brother     Social History   Tobacco Use  . Smoking status: Former Smoker    Packs/day: 0.50    Years: 10.00    Pack years: 5.00    Types: Cigarettes    Quit date: 03/07/1995    Years since quitting: 25.1  . Smokeless tobacco: Never Used  Vaping Use  . Vaping Use: Never used  Substance Use Topics  . Alcohol use: Yes    Comment: rare  . Drug use: Never    Home  Medications Prior to Admission medications   Medication Sig Start Date End Date Taking? Authorizing Provider  buPROPion (WELLBUTRIN XL) 300 MG 24 hr tablet Take 300 mg by mouth every morning. 04/07/17  Yes [provider]  Capsaicin-Menthol (SALONPAS GEL EX) Apply 1 application topically daily as needed (pain).   Yes [provider]  clonazePAM (KLONOPIN) 1 MG tablet TAKE 1 TABLET BY MOUTH EVERY DAY AS NEEDED FOR ANXIETY Patient taking differently: Take 1 mg by mouth daily as needed for anxiety.  07/07/19  Yes Susy Frizzle, MD  ezetimibe (ZETIA) 10 MG tablet TAKE 1 TABLET BY MOUTH EVERY DAY Patient taking differently: Take 10 mg by mouth  daily after breakfast.  07/17/19  Yes Pickard, Cammie Mcgee, MD  fluticasone [facility]) 50 MCG/ACT nasal spray Place 2 sprays into both nostrils daily as needed for allergies.    Yes [provider]  fluvoxaMINE (LUVOX) 100 MG tablet Take 125 mg by mouth in the morning, at noon, and at bedtime.  07/19/19  Yes [provider]  leflunomide (ARAVA) 20 MG tablet Take 1 tablet (20 mg total) by mouth daily. 04/25/20  Yes Ofilia Neas, PA-C  Menthol-Methyl Salicylate (SALONPAS PAIN RELIEF PATCH EX) Apply 1 application topically daily as needed (pain).   Yes [provider]  metoprolol succinate (TOPROL-XL) 50 MG 24 hr tablet TAKE 1 TABLET BY MOUTH ONCE DAILY FOLLOWING A MEAL Patient taking differently: Take 50 mg by mouth daily.  11/22/19  Yes Susy Frizzle, MD  naproxen sodium (ALEVE) 220 MG tablet Take 440 mg by mouth daily as needed (headache).   Yes [provider]  omeprazole (PRILOSEC) 40 MG capsule TAKE 1 CAPSULE BY MOUTH EVERY MORNING AND AT BEDTIME Patient taking differently: Take 40 mg by mouth in the morning and at bedtime.  02/29/20  Yes Susy Frizzle, MD  ondansetron (ZOFRAN) 4 MG tablet Take 4 mg by mouth 4 (four) times daily as needed for nausea.  04/25/20  Yes [provider]   oxyCODONE-acetaminophen (PERCOCET) 5-325 MG tablet Take 1 tablet by mouth at bedtime. 04/15/20  Yes Arbuckle, Modena Nunnery, MD  Probiotic Product (PROBIOTIC PO) Take 1 tablet by mouth daily after breakfast.   Yes [provider]  QUEtiapine (SEROQUEL) 25 MG tablet Take 25 mg by mouth at bedtime.  04/25/20  Yes [provider]  tiZANidine (ZANAFLEX) 4 MG tablet TAKE 1 TABLET (4 MG TOTAL) BY MOUTH EVERY 6 (SIX) HOURS AS NEEDED FOR MUSCLE SPASMS. 04/23/20  Yes Susy Frizzle, MD  valsartan (DIOVAN) 320 MG tablet TAKE 1 TABLET BY MOUTH EVERY DAY Patient taking differently: Take 320 mg by mouth daily.  04/23/20  Yes Susy Frizzle, MD  Vitamin D, Ergocalciferol, (DRISDOL) 1.25 MG (50000 UNIT) CAPS capsule Take 1 capsule (50,000 Units total) by mouth every 7 (seven) days. Patient taking differently: Take 50,000 Units by mouth every Friday.  11/10/19  Yes Susy Frizzle, MD    Allergies    Latex, Amlodipine besy-benazepril hcl, Augmentin [amoxicillin-pot clavulanate], Bextra [valdecoxib], Chlorhexidine, Lipitor [atorvastatin calcium], Sulfa antibiotics, and Zocor [simvastatin]  Review of Systems   Review of Systems  Constitutional: Negative for fever.  HENT: Negative for ear pain and sore throat.   Eyes: Negative for pain and visual disturbance.  Respiratory: Positive for shortness of breath (chronic, unchanged). Negative for cough.   Cardiovascular: Positive for chest pain (intermittent, ongoing for several months).  Gastrointestinal: Positive for nausea. Negative for abdominal pain, constipation, diarrhea and vomiting.  Genitourinary: Negative for dysuria and hematuria.  Musculoskeletal: Negative for back pain.  Skin: Negative for color change and rash.  Neurological: Positive for headaches. Negative for weakness and numbness.  All other systems reviewed and are negative.   Physical Exam Updated Vital Signs BP (!) 170/91   Pulse 82   Temp 98 F (36.7 C)   Resp 16    Ht 5\' 4"  (1.626 m)   Wt 98 kg   SpO2 92%   BMI 37.09 kg/m   Physical Exam Vitals and nursing note reviewed.  Constitutional:      General: She is not in acute distress.    Appearance: She is well-developed.  HENT:     Head: Normocephalic and atraumatic.  Eyes:     Conjunctiva/sclera: Conjunctivae normal.  Cardiovascular:     Rate and Rhythm: Normal rate and regular rhythm.     Heart sounds: Normal heart sounds. No murmur heard.   Pulmonary:     Effort: Pulmonary effort is normal. No respiratory distress.     Breath sounds: Normal breath sounds. No wheezing, rhonchi or rales.  Abdominal:     General: Bowel sounds are normal.     Palpations: Abdomen is soft.     Tenderness: There is no abdominal tenderness. There is no guarding or rebound.  Musculoskeletal:     Cervical back: Neck supple.  Skin:    General: Skin is warm and dry.  Neurological:     Mental Status: She is alert.     Comments: Mental Status:  Alert, thought content appropriate, able to give a coherent history. Speech fluent without evidence of aphasia. Able to follow 2 step commands without difficulty.  Cranial Nerves:  II: pupils equal, round, reactive to light III,IV, VI: ptosis not present, extra-ocular motions intact bilaterally  V,VII: smile symmetric, facial light touch sensation equal VIII: hearing grossly normal to voice  X: uvula elevates symmetrically  XI: bilateral shoulder shrug symmetric and strong XII: midline tongue extension without fassiculations Motor:  Normal tone. 5/5 strength of BUE and BLE major muscle groups including strong and equal grip strength and dorsiflexion/plantar flexion Sensory: light touch normal in all extremities.      ED Results / Procedures / Treatments   Labs (all labs ordered are listed, but only abnormal results are displayed) Labs Reviewed  CBC WITH DIFFERENTIAL/PLATELET - Abnormal; Notable for the following components:      Result Value   RBC 3.84 (*)     Hemoglobin 11.8 (*)    HCT 35.9 (*)    All other components within normal limits  COMPREHENSIVE METABOLIC PANEL - Abnormal; Notable for the following components:   Sodium 130 (*)    Chloride 95 (*)    Glucose, Bld 133 (*)    Total Protein 6.3 (*)    Total Bilirubin 0.2 (*)    All other components within normal limits  TROPONIN I (HIGH SENSITIVITY)    EKG None  Radiology DG Chest Portable 1 View  Result Date: 04/26/2020 CLINICAL DATA:  Intermittent chest pain EXAM: PORTABLE CHEST 1 VIEW COMPARISON:  02/24/2020 FINDINGS: Cardiac shadow is mildly enlarged but stable. Postsurgical changes are again seen and stable. Old rib fractures are noted on the left. Lungs are well aerated bilaterally without focal infiltrate. Mild bibasilar atelectasis is noted. IMPRESSION: Mild bibasilar atelectasis. Electronically Signed   By: Inez Catalina M.D.   On: 04/26/2020 03:19    Procedures Procedures (including critical care time)  Medications Ordered in ED Medications  oxyCODONE-acetaminophen (PERCOCET/ROXICET) 5-325 MG per tablet 1 tablet (1 tablet Oral Given 04/26/20 0323)    ED Course  I have reviewed the triage vital signs and the nursing notes.  Pertinent labs & imaging results that were available during my care of the patient were reviewed by me and considered in my medical decision making (see chart for details).    MDM Rules/Calculators/A&P                          69 year old female presenting the emergency department today for evaluation of high blood pressure.  Has had high blood pressure for several weeks but it was  noted today at her rheumatology appointment she was advised to come to the ED if blood pressure continues to increase.  She does have multiple complaints which all seem relatively chronic in nature.  She does not have any evidence of an AKI.  EKG and troponins are unremarkable.  Doubt ACS.  She does not have any evidence of hypertensive emergency on exam/work-up today.  Her  blood pressures are elevated however they have improved somewhat since she initially arrived.  She has been compliant with her medications.  She will likely need to have her medications titrated by her PCP.  She has an appointment next week and I advised that she keep a log of her blood pressures at home and PCP can follow-up to titrate meds as indicated.  Advised on follow-up and return precautions.  Patient was seen in conjunction with supervising physician, Dr. Roxanne Mins who personally evaluated patient is in agreement with plan.  Final Clinical Impression(s) / ED Diagnoses Final diagnoses:  Elevated blood pressure reading with diagnosis of hypertension  Hyponatremia  Normochromic normocytic anemia    Rx / DC Orders ED Discharge Orders    None       Rodney Booze, PA-C 04/26/20 0434    Rodney Booze, PA-C 00/92/33 0076    Delora Fuel, MD 22/63/33 [phone]

## 2020-04-26 NOTE — Telephone Encounter (Signed)
That is low, just hve her monitor the bp and only take the hctz if bp is consistently >140/90.

## 2020-04-26 NOTE — Discharge Instructions (Signed)
Please check your blood pressure once a day and keep a record of it.  Take that record with you when you see your primary care provider.  Except an extraordinary cases, changes in your blood pressure medication should be made by your primary care provider.

## 2020-04-26 NOTE — Telephone Encounter (Signed)
Janet Peterson called back with BP reading of 105/76 pharmacy has called to let her know new rx is ready to be picked up.

## 2020-04-26 NOTE — Progress Notes (Signed)
Patient was seen in the ER yesterday for high blood pressure.  Please call the patient. If BP is still running high, send in hctz 12.5 mg poqday and have her add that to her other bp drugs.

## 2020-04-26 NOTE — Progress Notes (Signed)
Sent patient rx that Provider requested, spoke with Mrs. Nephew this morning checking on her and had her check her BP again, BP 170/88, Will call patient again at noon and check BP numbers. Told Pt can come into office and I will check her BP for her.

## 2020-05-02 ENCOUNTER — Ambulatory Visit (INDEPENDENT_AMBULATORY_CARE_PROVIDER_SITE_OTHER): Payer: Medicare Other | Admitting: Family Medicine

## 2020-05-02 ENCOUNTER — Other Ambulatory Visit: Payer: Self-pay

## 2020-05-02 VITALS — BP 120/90 | HR 73 | Temp 97.6°F | Ht 64.0 in | Wt 201.0 lb

## 2020-05-02 DIAGNOSIS — I10 Essential (primary) hypertension: Secondary | ICD-10-CM

## 2020-05-02 DIAGNOSIS — R110 Nausea: Secondary | ICD-10-CM | POA: Diagnosis not present

## 2020-05-02 MED ORDER — PROCHLORPERAZINE MALEATE 10 MG PO TABS: 10.0000 mg | ORAL_TABLET | Freq: Four times a day (QID) | ORAL | 0 refills | Status: DC | PRN

## 2020-05-02 NOTE — Progress Notes (Signed)
Subjective:    Patient ID: Janet Peterson, female    DOB: 09-01-1950, 69 y.o.   MRN: [id number]  Abdominal Pain  Hypertension    6/21 Patient was recently admitted to the hospital with suicidal ideation.  She has since been discharged from behavioral health and requested a refill on her oxycodone.  However I wanted to see the patient first prior to refilling a controlled substance.  She is here today with her husband.  They both endorse that she is doing much better.  At the time, she was thinking about her son who has died.  She was having thoughts of wanting to overdose on her medication.  She states that she is no longer having those thoughts.  She is in a much better place.  Her psychiatrist increased her dose of fluvoxamine.  She is using Percocet at night to help her sleep and occasionally during the daytime if she walks a long distance.  She was started on vitamin D for vitamin D deficiency while in the hospital.  Vitamin D level was in the 20s.  At that time, my plan was: Patient states that she is doing better.  I am no longer concerned that she is actively suicidal.  I feel comfortable refilling her Percocet that she has been taking for quite some time.  Both she and her husband state that she is doing better with no concerns.  Therefore I will refill her Percocet, 60 tablets/month.  I will also refill the Roselyn Meier that she is taking as needed for migraines.  I recommended vitamin D 50,000 units weekly for 6 months and then recheck vitamin D level.  Also recommended that she decrease her Prilosec to 40 mg a day and if tolerating this in the future could try switching to Pepcid.  12/14/19 Blood pressure today is well controlled at 110/70. Patient is going on a 1 month vacation across the [place]. However her prescription will expire before she returns home from her vacation. She takes 1 Percocet a day due to pain in her joints and in her neck due to the psoriatic arthritis. She  is asking that I refill her prescription earlier so that she can take it with her. I would be happy to do that. She shows no evidence of abuse or diversion. She is also requesting a heel lift for her right shoe. 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 She benefits from a shoe lift on the right foot to help correct this which helps with hip pain and lower back pain   02/19/20 Patient states her symptoms began approximately 15 days ago.  She was driving down the road and suddenly became extremely nauseated.  Her husband had to pull over and she vomited approximately 6 times.  This occurred suddenly and without warning.  She then felt better however a few days later she started developing dark green stool.  She states that she was also having left-sided abdominal pain.  The pain is sharp.  Tends to be located on the left side.  She denies any dysuria or urgency or frequency.  She denies any hematuria.  She denies any melena or hematochezia.  She denies any reflux-like symptoms.  She denies any heartburn.  She does report constipation.  In fact the first week that the nausea and pain was occurring, the patient states that she was constipated and eventually had a taken Ex-Lax.  She  now states that she is having a bowel movement every day however it has changed in color and is green.  Furthermore she reports nausea as soon as she eats.  At that time, my plan was: I believe the patient's pain is likely due to constipation.  I believe that she may be so constipated that is distending her intestines causing pain and contributing to nausea.  I will obtain an x-ray to evaluate further to confirm my suspicions.  Meanwhile check a CBC to look for leukocytosis along with a CMP.  Other potential causes would be diverticulitis given the location of the pain.  If there is an elevated white blood cell count or the pain intensifies or if the x-ray shows no constipation, I  will treat the patient empirically for possible diverticulitis.  Begin Linzess 145 mcg daily  03/05/20 The x-ray that I obtained showed a large amount of stool in the colon.  Therefore I recommended that she try Linzess.  She did but then she went to the hospital in early October due to abdominal pain.  In the hospital chest x-ray was unremarkable.  CT scan was unremarkable.  CMP and CBC were unremarkable.  Patient was diagnosed with irritable bowel symptoms and given Bentyl for abdominal pain.  She has stopped the Linzess.  She is back to having a bowel movement every 3 or 4 days.  The Bentyl does help with the abdominal pain.  Zofran also helps with the abdominal pain and nausea and she would like to have a prescription for the Zofran.  At that time, my plan was: I believe her abdominal pain is due to a combination of constipation and irritable bowel.  I recommended that she stay on Linzess 145 mcg daily to try to have a bowel movement every day to help prevent the abdominal pain.  She can also certainly use Bentyl for intestinal spasms.  I encouraged her to eat more fiber and drink more water.  I will refill her Zofran.  03/22/20 Patient had to discontinue the Linzess because she felt like it was making her nausea worse.  Since I last saw her she states that the abdominal pains have subsided.  However she does report epigastric discomfort especially after she eats.  She reports nausea after eating.  She is taking the Zofran but she states it takes 2 hours for the Zofran to take effect.  She also reports having a difficult time sleeping.  She states that she stays awake at night unable to fall asleep.  She denies any melena or hematochezia.  She denies any fever or chills.  She denies any vomiting.  She had an EGD in May that revealed a hiatal hernia but was otherwise normal.  She had a colonoscopy in May as well.  She had a CT scan in October that revealed no explanation for her pain.  Therefore I believe  a lot of her symptoms could be related to irritable bowel plus medication side effects.  Of note she is taking Aleve every day for her back pain.  This could potentially be causing gastritis despite the fact she is on omeprazole.  At that time, my plan was: I believe the majority of her symptoms are IBS.  Therefore we need to focus on finding ways to help manage her symptoms.  I think the Aleve could be contributing some to her dyspepsia.  Therefore I recommended that she discontinue Aleve altogether and allow 2 weeks to see if the irritation in  her stomach will improve.  I also recommended that we try premedicating with Phenergan rather than waiting until she becomes nauseated and then taking Zofran.  I chose Phenergan because I am hoping that it will help her sleep better at night.  I recommended that she premedicate with Phenergan at night prior to bed.  Hopefully this will help her fall asleep easier and not be habit-forming.  I hope that this will also be still present somewhat when she wakes up in the morning so that she is not as nauseated in the morning.  She can then take a Phenergan again during the daytime to help with nausea prior to lunch and dinner.  Therefore we will schedule the Phenergan twice daily and see if this helps settle her stomach.  She has an appointment to follow-up with her gastroenterologist in December hopefully we can help alleviate some of her symptoms prior to that.   04/11/20 Unfortunately, the patient has seen no improvement since I saw her last.  She is not having abdominal pain but she still continues to have nausea.  She states that she can eat 5 or 6 bites and she instantly becomes full and feels nauseated.  She has been staying off all NSAIDs and this has not helped.  Phenergan twice a day has not helped as well.  The Bentyl has not helped.  She also had a normal abdominal ultrasound in April.  Therefore work-up today includes a normal abdominal ultrasound in April, and  EGD in colonoscopy in May, CT scan earlier this fall none of which have shown an explanation.  At the present time her biggest symptoms are nausea and early satiety.  She denies any severe fever although she does have night sweats.  At that time, my plan was: Given her dyspepsia, I will screen the patient for H pylori.  This has not been done yet.  Next on the differential diagnosis would be gastroparesis versus biliary dyskinesia.  She has had a normal right upper quadrant ultrasound but she has not had a HIDA scan.  I will empirically treat the patient with Reglan 10 mg before meals over the weekend to see if her symptoms improve.  If they do, I would recommend performing a gastric emptying test.  If they do not, I will try to expedite her GI consultation.  She also started Arava around the same time that symptoms began.  There is a 10% chance this can cause similar symptoms to what she is experiencing.  Therefore I recommended that she reach out to her rheumatologist and touch base with her to see if she feels like this could be playing any role.  05/02/20 Patient continues to experience nausea.  She has seen GI who scheduling her for a gastric emptying study to evaluate for gastroparesis.  She would like to try Compazine as needed for nausea as Zofran is not effective.  Phenergan has not been effective either.  She tried some of her husband's Compazine and that seemed to help.  Her blood pressures also been elevated recently at 150-170/90-100.  However here today her blood pressure is much better.  She just started hydrochlorothiazide 4 days ago.  I do not feel that is had adequate time to take full effect.  Past Medical History:  Diagnosis Date  . Ankylosing spondylitis ([place])   . Anxiety and depression   . Depression   . Fibromyalgia   . GERD (gastroesophageal reflux disease)   . Hiatal hernia  per patient, dx by GI   . History of basal cell carcinoma excision    FACE, [place]  . History  of hiatal hernia   . History of thrush   . Hyperlipidemia   . Hypertension   . Insomnia   . Left breast mass   . OCD (obsessive compulsive disorder)   . OSA (obstructive sleep apnea)    MILD AND NO CPAP SINCE SURGERY IN 2007  . Osteopenia   . PONV (postoperative nausea and vomiting)   . Psoriatic arthritis ([place])   . PVC (premature ventricular contraction)   . Rheumatoid arthritis [facility])    Past Surgical History:  Procedure Laterality Date  . BIOPSY  09/29/2019   Procedure: BIOPSY;  Surgeon: Ronnette Juniper, MD;  Location: [facility];  Service: Gastroenterology;;  . BREAST LUMPECTOMY WITH RADIOACTIVE SEED LOCALIZATION Left 05/04/2018   Procedure: LEFT BREAST LUMPECTOMY WITH RADIOACTIVE SEED LOCALIZATION AND LEFT BREAST NIPPLE BIOPSY;  Surgeon: Jovita Kussmaul, MD;  Location: [place];  Service: General;  Laterality: Left;  . BREAST SURGERY  1975   lumpectomy-- benign  . BUNIONECTOMY  1991  . CATARACT EXTRACTION W/ INTRAOCULAR LENS  IMPLANT, BILATERAL  2000  . CERVICAL FUSION  March 2013   C5 -- C7  . CESAREAN SECTION  1985  . COLONOSCOPY WITH PROPOFOL N/A 09/29/2019   Procedure: COLONOSCOPY WITH PROPOFOL;  Surgeon: Ronnette Juniper, MD;  Location: [facility];  Service: Gastroenterology;  Laterality: N/A;  . DISTAL INTERPHALANGEAL JOINT FUSION Right 03/14/2015   Procedure: RIGHT LONG FINGER DISTAL INTERPHALANGEAL JOINT ARTHRODESIS;  Surgeon: Iran Planas, MD;  Location: [place];  Service: Orthopedics;  Laterality: Right;  . ESOPHAGOGASTRODUODENOSCOPY (EGD) WITH PROPOFOL N/A 09/29/2019   Procedure: ESOPHAGOGASTRODUODENOSCOPY (EGD) WITH PROPOFOL;  Surgeon: Ronnette Juniper, MD;  Location: [facility];  Service: Gastroenterology;  Laterality: N/A;  . EYE SURGERY  1995   rk (laser surgery), semi cornea transplant, detacted retina,  fluid removal  . KNEE ARTHROSCOPY Left 03-03-2004  . POLYPECTOMY  09/29/2019   Procedure: POLYPECTOMY;  Surgeon: Ronnette Juniper, MD;   Location: [facility];  Service: Gastroenterology;;  . POSTERIOR VITRECTOMY RIGHT EYE AND LASER   10-27-1999  . SHOULDER SURGERY Right 1997  . SPINAL FIXATION SURGERY W/ IMPLANT  2013 rod #1//  2014  rod 2   S1 -- T10  (rod #1)//   S1 -- T4 (rod #2)  . THORACIC FUSION  03-13-2013   REMOVAL HARDWARE/  BONE GRAFT FUSION T10//  REVISION OF RODS  . TOTAL KNEE ARTHROPLASTY Left 12-14-2005  . UVULOPALATOPHARYNGOPLASTY  04-26-2006   w/  TONSILLECTOMY/  TURBINATE REDUCTIONS/  BILATERAL ANTERIOR ETHMOIDECTOMY   Current Outpatient Medications on File Prior to Visit  Medication Sig Dispense Refill  . buPROPion (WELLBUTRIN XL) 300 MG 24 hr tablet Take 300 mg by mouth every morning.  1  . Capsaicin-Menthol (SALONPAS GEL EX) Apply 1 application topically daily as needed (pain).    . clonazePAM (KLONOPIN) 1 MG tablet TAKE 1 TABLET BY MOUTH EVERY DAY AS NEEDED FOR ANXIETY (Patient taking differently: Take 1 mg by mouth daily as needed for anxiety.) 30 tablet 1  . ezetimibe (ZETIA) 10 MG tablet TAKE 1 TABLET BY MOUTH EVERY DAY (Patient taking differently: Take 10 mg by mouth daily after breakfast.) 90 tablet 3  . fluticasone (FLONASE) 50 MCG/ACT nasal spray Place 2 sprays into both nostrils daily as needed for allergies.     . fluvoxaMINE (LUVOX) 100 MG tablet  Take 125 mg by mouth in the morning, at noon, and at bedtime.     . hydrochlorothiazide (HYDRODIURIL) 25 MG tablet Take 0.5 tablets (12.5 mg total) by mouth daily. 90 tablet 3  . leflunomide (ARAVA) 20 MG tablet Take 1 tablet (20 mg total) by mouth daily. 90 tablet 0  . Menthol-Methyl Salicylate (SALONPAS PAIN RELIEF PATCH EX) Apply 1 application topically daily as needed (pain).    . metoprolol succinate (TOPROL-XL) 50 MG 24 hr tablet TAKE 1 TABLET BY MOUTH ONCE DAILY FOLLOWING A MEAL (Patient taking differently: Take 50 mg by mouth daily.) 90 tablet 3  . naproxen sodium (ALEVE) 220 MG tablet Take 440 mg by mouth daily as needed (headache).    Marland Kitchen  omeprazole (PRILOSEC) 40 MG capsule TAKE 1 CAPSULE BY MOUTH EVERY MORNING AND AT BEDTIME (Patient taking differently: Take 40 mg by mouth in the morning and at bedtime.) 180 capsule 1  . ondansetron (ZOFRAN) 4 MG tablet Take 4 mg by mouth 4 (four) times daily as needed for nausea.     Marland Kitchen oxyCODONE-acetaminophen (PERCOCET) 5-325 MG tablet Take 1 tablet by mouth at bedtime. 60 tablet 0  . Probiotic Product (PROBIOTIC PO) Take 1 tablet by mouth daily after breakfast.    . QUEtiapine (SEROQUEL) 25 MG tablet Take 25 mg by mouth at bedtime.     Marland Kitchen tiZANidine (ZANAFLEX) 4 MG tablet TAKE 1 TABLET (4 MG TOTAL) BY MOUTH EVERY 6 (SIX) HOURS AS NEEDED FOR MUSCLE SPASMS. 30 tablet 0  . valsartan (DIOVAN) 320 MG tablet TAKE 1 TABLET BY MOUTH EVERY DAY (Patient taking differently: Take 320 mg by mouth daily.) 90 tablet 3  . Vitamin D, Ergocalciferol, (DRISDOL) 1.25 MG (50000 UNIT) CAPS capsule Take 1 capsule (50,000 Units total) by mouth every 7 (seven) days. (Patient taking differently: Take 50,000 Units by mouth every Friday.) 5 capsule 5   No current facility-administered medications on file prior to visit.   Allergies  Allergen Reactions  . Latex Rash    And Mouth sores  . Amlodipine Besy-Benazepril Hcl Other (See Comments)    COUGH  . Augmentin [Amoxicillin-Pot Clavulanate] Diarrhea and Nausea And Vomiting  . Bextra [Valdecoxib] Diarrhea and Nausea And Vomiting    REFLUX  . Chlorhexidine     rash  . Lipitor [Atorvastatin Calcium] Other (See Comments)    MYALGIAS  . Sulfa Antibiotics Nausea And Vomiting    CRAMPING  . Zocor [Simvastatin] Other (See Comments)    MYALGIA   Social History   Socioeconomic History  . Marital status: Married    Spouse name: Not on file  . Number of children: Not on file  . Years of education: Not on file  . Highest education level: Not on file  Occupational History  . Not on file  Tobacco Use  . Smoking status: Former Smoker    Packs/day: 0.50    Years: 10.00     Pack years: 5.00    Types: Cigarettes    Quit date: 03/07/1995    Years since quitting: 25.1  . Smokeless tobacco: Never Used  Vaping Use  . Vaping Use: Never used  Substance and Sexual Activity  . Alcohol use: Yes    Comment: rare  . Drug use: Never  . Sexual activity: Not on file  Other Topics Concern  . Not on file  Social History Narrative  . Not on file   Social Determinants of Health   Financial Resource Strain: Not on file  Food Insecurity:  Not on file  Transportation Needs: Not on file  Physical Activity: Not on file  Stress: Not on file  Social Connections: Not on file  Intimate Partner Violence: Not on file    Review of Systems  Gastrointestinal: Positive for abdominal pain.  All other systems reviewed and are negative.      Objective:   Physical Exam Vitals reviewed.  Constitutional:      General: She is not in acute distress.    Appearance: Normal appearance. She is normal weight. She is not ill-appearing or toxic-appearing.  Cardiovascular:     Rate and Rhythm: Normal rate and regular rhythm.     Heart sounds: Normal heart sounds. No murmur heard. No friction rub. No gallop.   Pulmonary:     Effort: Pulmonary effort is normal. No respiratory distress.     Breath sounds: Normal breath sounds. No wheezing, rhonchi or rales.  Abdominal:     General: Abdomen is flat. Bowel sounds are normal. There is no distension.     Palpations: Abdomen is soft.     Tenderness: There is no abdominal tenderness.  Musculoskeletal:     Right lower leg: No edema.     Left lower leg: No edema.  Neurological:     Mental Status: She is alert.           Assessment & Plan:  Nausea  Benign essential HTN  I spent more than 30 minutes today with the patient and her husband.  I feel a lot of her symptoms could be due to polypharmacy and additive effect of all the medication she is taking.  Therefore I am willing to give her Compazine 10 mg every 6 hours as needed  however I encouraged her to take it sparingly.  Also recommended that she stop tizanidine, Seroquel, and oxycodone to avoid anticholinergic and unintended side effects.  I would like her to talk with each of her physicians about trying to wean off medicine gradually to see if some of this could be medication side effect induced.  Meanwhile her blood pressure today is much better than it was 4 days ago.  Allow another week or so for the hydrochlorothiazide to take full effect and then see what her blood pressures are running.

## 2020-05-09 ENCOUNTER — Other Ambulatory Visit: Payer: Self-pay | Admitting: Family Medicine

## 2020-05-09 ENCOUNTER — Telehealth: Payer: Self-pay

## 2020-05-09 MED ORDER — AMLODIPINE BESYLATE 10 MG PO TABS: 10.0000 mg | ORAL_TABLET | Freq: Every day | ORAL | 3 refills | Status: DC

## 2020-05-09 NOTE — Telephone Encounter (Signed)
Mr. Perno called Mrs. Newlun BP is still elevated he said even with the Hctz,  170/101

## 2020-05-09 NOTE — Telephone Encounter (Signed)
I will call in amlodipine 10 mg a day for htn

## 2020-05-10 NOTE — Telephone Encounter (Signed)
Called patient she has already picked up new rx

## 2020-05-13 ENCOUNTER — Emergency Department (HOSPITAL_COMMUNITY)
Admission: EM | Admit: 2020-05-13 | Discharge: 2020-05-14 | Disposition: A | Discharge status: Home / Self Care | Payer: Medicare Other | Attending: Emergency Medicine | Admitting: Emergency Medicine

## 2020-05-13 ENCOUNTER — Emergency Department (HOSPITAL_COMMUNITY): Payer: Medicare Other

## 2020-05-13 ENCOUNTER — Other Ambulatory Visit: Payer: Self-pay

## 2020-05-13 ENCOUNTER — Encounter (HOSPITAL_COMMUNITY): Payer: Self-pay | Admitting: Emergency Medicine

## 2020-05-13 DIAGNOSIS — Z79899 Other long term (current) drug therapy: Secondary | ICD-10-CM | POA: Insufficient documentation

## 2020-05-13 DIAGNOSIS — Z96652 Presence of left artificial knee joint: Secondary | ICD-10-CM | POA: Diagnosis not present

## 2020-05-13 DIAGNOSIS — Z87891 Personal history of nicotine dependence: Secondary | ICD-10-CM | POA: Insufficient documentation

## 2020-05-13 DIAGNOSIS — Z91040 Latex allergy status: Secondary | ICD-10-CM | POA: Diagnosis not present

## 2020-05-13 DIAGNOSIS — I951 Orthostatic hypotension: Secondary | ICD-10-CM | POA: Diagnosis not present

## 2020-05-13 DIAGNOSIS — I10 Essential (primary) hypertension: Secondary | ICD-10-CM | POA: Insufficient documentation

## 2020-05-13 DIAGNOSIS — R55 Syncope and collapse: Secondary | ICD-10-CM | POA: Diagnosis present

## 2020-05-13 LAB — CBC WITH DIFFERENTIAL/PLATELET
Abs Immature Granulocytes: 0.06 10*3/uL (ref 0.00–0.07)
Basophils Absolute: 0.1 10*3/uL (ref 0.0–0.1)
Basophils Relative: 1 %
Eosinophils Absolute: 0.1 10*3/uL (ref 0.0–0.5)
Eosinophils Relative: 2 %
HCT: 34.2 % — ABNORMAL LOW (ref 36.0–46.0)
Hemoglobin: 11.8 g/dL — ABNORMAL LOW (ref 12.0–15.0)
Immature Granulocytes: 1 %
Lymphocytes Relative: 25 %
Lymphs Abs: 2.1 10*3/uL (ref 0.7–4.0)
MCH: 31.2 pg (ref 26.0–34.0)
MCHC: 34.5 g/dL (ref 30.0–36.0)
MCV: 90.5 fL (ref 80.0–100.0)
Monocytes Absolute: 0.7 10*3/uL (ref 0.1–1.0)
Monocytes Relative: 8 %
Neutro Abs: 5.2 10*3/uL (ref 1.7–7.7)
Neutrophils Relative %: 63 %
Platelets: 202 10*3/uL (ref 150–400)
RBC: 3.78 MIL/uL — ABNORMAL LOW (ref 3.87–5.11)
RDW: 13.4 % (ref 11.5–15.5)
WBC: 8.2 10*3/uL (ref 4.0–10.5)
nRBC: 0 % (ref 0.0–0.2)

## 2020-05-13 LAB — BASIC METABOLIC PANEL
Anion gap: 11 (ref 5–15)
BUN: 14 mg/dL (ref 8–23)
CO2: 27 mmol/L (ref 22–32)
Calcium: 9.3 mg/dL (ref 8.9–10.3)
Chloride: 90 mmol/L — ABNORMAL LOW (ref 98–111)
Creatinine, Ser: 0.96 mg/dL (ref 0.44–1.00)
GFR, Estimated: >60 mL/min (ref >60)
Glucose, Bld: 110 mg/dL — ABNORMAL HIGH (ref 70–99)
Potassium: 4.4 mmol/L (ref 3.5–5.1)
Sodium: 128 mmol/L — ABNORMAL LOW (ref 135–145)

## 2020-05-13 LAB — CBG MONITORING, ED: Glucose-Capillary: 92 mg/dL (ref 70–99)

## 2020-05-13 LAB — TROPONIN I (HIGH SENSITIVITY): Troponin I (High Sensitivity): 5 ng/L (ref <18)

## 2020-05-13 MED ORDER — SODIUM CHLORIDE 0.9 % IV BOLUS
500.0000 mL | Freq: Once | INTRAVENOUS | Status: AC
  Administered 2020-05-14: 500 mL via INTRAVENOUS

## 2020-05-13 NOTE — ED Notes (Signed)
Patient transported to X-ray 

## 2020-05-13 NOTE — ED Provider Notes (Signed)
[facility] EMERGENCY DEPARTMENT Provider Note   CSN: [id number] Arrival date & time: 05/13/20  2131     History Chief Complaint  Patient presents with  . Loss of Consciousness    Janet Peterson is a 69 y.o. female.  69 y/o female with hx of fibromyalgia, HLD, HTN, GERD presents to the ED after a syncopal event. She has been lying in the bed at home most of the day. Got up and was walking out of her room when she started to feel "woozy", lightheaded. Went to hold onto the door frame and yelled for her husband to help her, but had a syncopal event and fell to the floor. After regaining consciousness had some ascending darkening of her vision which originated in her inferior visual fields; never had complete vision loss. This has resolved with no reports of visual field deficits currently. Husband reports the patient striking her posterior head from the fall. She has no c/o headache at this time. Is not chronically anticoagulated. EMS reports the patient was orthostatic with initial BP of 82/68, now 108/69 after 200cc IVF. C/o chest tightness when leaning forward, improved when supine. This was not present prior to her syncopal event. No SOB, neck pain, extremity numbness/weakness, N/V, recent fevers, melena, hematochezia.        Past Medical History:  Diagnosis Date  . Ankylosing spondylitis ([place])   . Anxiety and depression   . Depression   . Fibromyalgia   . GERD (gastroesophageal reflux disease)   . Hiatal hernia    per patient, dx by GI   . History of basal cell carcinoma excision    FACE, [place]  . History of hiatal hernia   . History of thrush   . Hyperlipidemia   . Hypertension   . Insomnia   . Left breast mass   . OCD (obsessive compulsive disorder)   . OSA (obstructive sleep apnea)    MILD AND NO CPAP SINCE SURGERY IN 2007  . Osteopenia   . PONV (postoperative nausea and vomiting)   . Psoriatic arthritis ([place])   . PVC (premature  ventricular contraction)   . Rheumatoid arthritis [facility])     Patient Active Problem List   Diagnosis Date Noted  . C. difficile colitis 09/29/2019  . Colitis 09/28/2019  . Anxiety and depression 09/28/2019  . DDD (degenerative disc disease), cervical s/p fusion 11/12/2016  . H/O total knee replacement, left 05/06/2016  . DDD lumbar spine status post fusion 05/06/2016  . Psoriasis 05/05/2016  . High risk medication use 05/05/2016  . Cervical post-laminectomy syndrome 12/09/2015  . Thoracic postlaminectomy syndrome 12/09/2015  . Psoriatic arthritis ([place]) 08/23/2012  . Osteopenia 08/23/2012  . HLD (hyperlipidemia) 08/23/2012  . RLS (restless legs syndrome) 08/23/2012  . Insomnia 08/23/2012  . Fibromyalgia syndrome 08/12/2012  . Low back pain 08/12/2012  . Syncope   . PVC (premature ventricular contraction)   . OCD (obsessive compulsive disorder)   . GERD (gastroesophageal reflux disease)   . Hypertension     Past Surgical History:  Procedure Laterality Date  . BIOPSY  09/29/2019   Procedure: BIOPSY;  Surgeon: Ronnette Juniper, MD;  Location: [facility];  Service: Gastroenterology;;  . BREAST LUMPECTOMY WITH RADIOACTIVE SEED LOCALIZATION Left 05/04/2018   Procedure: LEFT BREAST LUMPECTOMY WITH RADIOACTIVE SEED LOCALIZATION AND LEFT BREAST NIPPLE BIOPSY;  Surgeon: Jovita Kussmaul, MD;  Location: [place];  Service: General;  Laterality: Left;  . BREAST SURGERY  1975   lumpectomy-- benign  . BUNIONECTOMY  1991  . CATARACT EXTRACTION W/ INTRAOCULAR LENS  IMPLANT, BILATERAL  2000  . CERVICAL FUSION  March 2013   C5 -- C7  . CESAREAN SECTION  1985  . COLONOSCOPY WITH PROPOFOL N/A 09/29/2019   Procedure: COLONOSCOPY WITH PROPOFOL;  Surgeon: Ronnette Juniper, MD;  Location: [facility];  Service: Gastroenterology;  Laterality: N/A;  . DISTAL INTERPHALANGEAL JOINT FUSION Right 03/14/2015   Procedure: RIGHT LONG FINGER DISTAL INTERPHALANGEAL JOINT ARTHRODESIS;  Surgeon: Iran Planas, MD;  Location: [place];  Service: Orthopedics;  Laterality: Right;  . ESOPHAGOGASTRODUODENOSCOPY (EGD) WITH PROPOFOL N/A 09/29/2019   Procedure: ESOPHAGOGASTRODUODENOSCOPY (EGD) WITH PROPOFOL;  Surgeon: Ronnette Juniper, MD;  Location: [facility];  Service: Gastroenterology;  Laterality: N/A;  . EYE SURGERY  1995   rk (laser surgery), semi cornea transplant, detacted retina,  fluid removal  . KNEE ARTHROSCOPY Left 03-03-2004  . POLYPECTOMY  09/29/2019   Procedure: POLYPECTOMY;  Surgeon: Ronnette Juniper, MD;  Location: [facility];  Service: Gastroenterology;;  . POSTERIOR VITRECTOMY RIGHT EYE AND LASER   10-27-1999  . SHOULDER SURGERY Right 1997  . SPINAL FIXATION SURGERY W/ IMPLANT  2013 rod #1//  2014  rod 2   S1 -- T10  (rod #1)//   S1 -- T4 (rod #2)  . THORACIC FUSION  03-13-2013   REMOVAL HARDWARE/  BONE GRAFT FUSION T10//  REVISION OF RODS  . TOTAL KNEE ARTHROPLASTY Left 12-14-2005  . UVULOPALATOPHARYNGOPLASTY  04-26-2006   w/  TONSILLECTOMY/  TURBINATE REDUCTIONS/  BILATERAL ANTERIOR ETHMOIDECTOMY     OB History   No obstetric history on file.     Family History  Problem Relation Age of Onset  . Diabetes Mother   . Heart disease Mother   . Diabetes Father   . Anuerysm Father   . Diabetes Brother   . Heart disease Brother   . Diabetes Brother   . Heart disease Brother     Social History   Tobacco Use  . Smoking status: Former Smoker    Packs/day: 0.50    Years: 10.00    Pack years: 5.00    Types: Cigarettes    Quit date: 03/07/1995    Years since quitting: 25.2  . Smokeless tobacco: Never Used  Vaping Use  . Vaping Use: Never used  Substance Use Topics  . Alcohol use: Yes    Comment: rare  . Drug use: Never    Home Medications Prior to Admission medications   Medication Sig Start Date End Date Taking? Authorizing Provider  amLODipine (NORVASC) 10 MG tablet Take 1 tablet (10 mg total) by mouth daily. 05/09/20   Susy Frizzle, MD   buPROPion (WELLBUTRIN XL) 300 MG 24 hr tablet Take 300 mg by mouth every morning. 04/07/17   [provider]  Capsaicin-Menthol (SALONPAS GEL EX) Apply 1 application topically daily as needed (pain).    [provider]  clonazePAM (KLONOPIN) 1 MG tablet TAKE 1 TABLET BY MOUTH EVERY DAY AS NEEDED FOR ANXIETY Patient taking differently: Take 1 mg by mouth daily as needed for anxiety. 07/07/19   Susy Frizzle, MD  ezetimibe (ZETIA) 10 MG tablet TAKE 1 TABLET BY MOUTH EVERY DAY Patient taking differently: Take 10 mg by mouth daily after breakfast. 07/17/19   Susy Frizzle, MD  fluticasone (FLONASE) 50 MCG/ACT nasal spray Place 2 sprays into both nostrils daily as needed for allergies.     [provider]  fluvoxaMINE (LUVOX) 100 MG tablet Take 125 mg by mouth in the morning, at noon, and at bedtime.  07/19/19   [provider]  hydrochlorothiazide (HYDRODIURIL) 25 MG tablet Take 0.5 tablets (12.5 mg total) by mouth daily. 04/26/20   Susy Frizzle, MD  leflunomide (ARAVA) 20 MG tablet Take 1 tablet (20 mg total) by mouth daily. 04/25/20   Ofilia Neas, PA-C  Menthol-Methyl Salicylate (SALONPAS PAIN RELIEF PATCH EX) Apply 1 application topically daily as needed (pain).    [provider]  metoprolol succinate (TOPROL-XL) 50 MG 24 hr tablet TAKE 1 TABLET BY MOUTH ONCE DAILY FOLLOWING A MEAL Patient taking differently: Take 50 mg by mouth daily. 11/22/19   Susy Frizzle, MD  naproxen sodium (ALEVE) 220 MG tablet Take 440 mg by mouth daily as needed (headache).    [provider]  omeprazole (PRILOSEC) 40 MG capsule TAKE 1 CAPSULE BY MOUTH EVERY MORNING AND AT BEDTIME Patient taking differently: Take 40 mg by mouth in the morning and at bedtime. 02/29/20   Susy Frizzle, MD  ondansetron (ZOFRAN) 4 MG tablet Take 4 mg by mouth 4 (four) times daily as needed for nausea.  04/25/20   [provider]  oxyCODONE-acetaminophen (PERCOCET)  5-325 MG tablet Take 1 tablet by mouth at bedtime. 04/15/20   Alycia Rossetti, MD  Probiotic Product (PROBIOTIC PO) Take 1 tablet by mouth daily after breakfast.    [provider]  prochlorperazine (COMPAZINE) 10 MG tablet Take 1 tablet (10 mg total) by mouth every 6 (six) hours as needed for nausea or vomiting. 05/02/20   Susy Frizzle, MD  QUEtiapine (SEROQUEL) 25 MG tablet Take 25 mg by mouth at bedtime.  04/25/20   [provider]  tiZANidine (ZANAFLEX) 4 MG tablet TAKE 1 TABLET (4 MG TOTAL) BY MOUTH EVERY 6 (SIX) HOURS AS NEEDED FOR MUSCLE SPASMS. 04/23/20   Susy Frizzle, MD  valsartan (DIOVAN) 320 MG tablet TAKE 1 TABLET BY MOUTH EVERY DAY Patient taking differently: Take 320 mg by mouth daily. 04/23/20   Susy Frizzle, MD  Vitamin D, Ergocalciferol, (DRISDOL) 1.25 MG (50000 UNIT) CAPS capsule Take 1 capsule (50,000 Units total) by mouth every 7 (seven) days. Patient taking differently: Take 50,000 Units by mouth every Friday. 11/10/19   Susy Frizzle, MD    Allergies    Latex, Amlodipine besy-benazepril hcl, Augmentin [amoxicillin-pot clavulanate], Bextra [valdecoxib], Chlorhexidine, Lipitor [atorvastatin calcium], Sulfa antibiotics, and Zocor [simvastatin]  Review of Systems   Review of Systems  Ten systems reviewed and are negative for acute change, except as noted in the HPI.    Physical Exam Updated Vital Signs BP 137/66   Pulse 67   Temp 97.7 F (36.5 C) (Oral)   Resp 15   SpO2 95%   Physical Exam Vitals and nursing note reviewed.  Constitutional:      General: She is not in acute distress.    Appearance: She is well-developed and well-nourished. She is not diaphoretic.     Comments: Nontoxic appearing and in NAD  HENT:     Head: Normocephalic and atraumatic.     Comments: No hematoma or contusion to scalp. No battle's sign or raccoon's eyes. Eyes:     General: No scleral icterus.    Extraocular Movements: EOM normal.      Conjunctiva/sclera: Conjunctivae normal.  Neck:     Comments: No meningismus  Cardiovascular:     Rate and Rhythm: Normal rate and regular  rhythm.     Pulses: Normal pulses.  Pulmonary:     Effort: Pulmonary effort is normal. No respiratory distress.     Breath sounds: No stridor. No wheezing, rhonchi or rales.     Comments: Lungs CTAB Musculoskeletal:        General: Normal range of motion.     Cervical back: Normal range of motion.     Comments: Mild TTP to the lumbosacral midline without bony deformities, step offs, crepitus.   Skin:    General: Skin is warm and dry.     Coloration: Skin is not pale.     Findings: No erythema or rash.  Neurological:     Mental Status: She is alert and oriented to person, place, and time.     Coordination: Coordination normal.     Comments: GCS 15. Speech is goal oriented. Patient has equal grip strength bilaterally with 5/5 strength against resistance in all major muscle groups bilaterally. Sensation to light touch intact. Patient moves extremities without ataxia.  Psychiatric:        Mood and Affect: Mood and affect normal.        Behavior: Behavior normal.     ED Results / Procedures / Treatments   Labs (all labs ordered are listed, but only abnormal results are displayed) Labs Reviewed  CBC WITH DIFFERENTIAL/PLATELET - Abnormal; Notable for the following components:      Result Value   RBC 3.78 (*)    Hemoglobin 11.8 (*)    HCT 34.2 (*)    All other components within normal limits  BASIC METABOLIC PANEL - Abnormal; Notable for the following components:   Sodium 128 (*)    Chloride 90 (*)    Glucose, Bld 110 (*)    All other components within normal limits  CBG MONITORING, ED  TROPONIN I (HIGH SENSITIVITY)    EKG EKG Interpretation  Date/Time:  Monday May 13 2020 21:30:02 EST Ventricular Rate:  59 PR Interval:  160 QRS Duration: 96 QT Interval:  454 QTC Calculation: 449 R Axis:   43 Text Interpretation: Sinus  bradycardia Left ventricular hypertrophy with repolarization abnormality ( R in aVL ) Abnormal ECG No significant change since last tracing Confirmed by Orpah Greek [phone]) on 05/13/2020 11:39:37 PM   Radiology DG Chest 2 View  Result Date: 05/13/2020 CLINICAL DATA:  69 year old female with chest tightness and syncope. EXAM: CHEST - 2 VIEW COMPARISON:  Chest radiograph dated 04/26/2020. FINDINGS: No focal consolidation, pleural effusion or pneumothorax. Mild cardiomegaly. No acute osseous pathology. Extensive spinal fusion hardware as well as partially visualized cervical ACDF. IMPRESSION: No active cardiopulmonary disease. Electronically Signed   By: Anner Crete M.D.   On: 05/13/2020 23:41   DG Lumbar Spine Complete  Result Date: 05/13/2020 CLINICAL DATA:  69 year old female with chest tightness. Fall. EXAM: LUMBAR SPINE - COMPLETE 4+ VIEW COMPARISON:  CT abdomen pelvis dated 02/25/2020. FINDINGS: Evaluation is limited due to advanced osteopenia and fusion hardware. There is no acute fracture or subluxation. Extensive posterior fusion hardware. The hardware appears intact. Atherosclerotic calcification of the aorta. The soft tissues are unremarkable. IMPRESSION: No acute/traumatic lumbar spine pathology. Electronically Signed   By: Anner Crete M.D.   On: 05/13/2020 23:43    Procedures Procedures (including critical care time)  Medications Ordered in ED Medications  sodium chloride 0.9 % bolus 500 mL (0 mLs Intravenous Stopped 05/14/20 0436)  acetaminophen (TYLENOL) tablet 650 mg (650 mg Oral Given 05/14/20 0129)  sodium chloride 0.9 %  bolus 500 mL (0 mLs Intravenous Stopped 05/14/20 0452)  oxyCODONE-acetaminophen (PERCOCET/ROXICET) 5-325 MG per tablet 1 tablet (1 tablet Oral Given 05/14/20 0450)    ED Course  I have reviewed the triage vital signs and the nursing notes.  Pertinent labs & imaging results that were available during my care of the patient were reviewed  by me and considered in my medical decision making (see chart for details).  Clinical Course as of 05/14/20 2217  Tue May 14, 2020  0404 Orthostatics positive, but without change in HR. This may be related to her use of Toprol preventing her HR from compensating when she changes position and her BP drops - causing her syncopal event. Will give additional IVF.  She has otherwise been stable in the ED. Headache helped with Tylenol. She does take Oxycodone for fibromyalgia QHS. This has been ordered for the patient given her prolonged ED stay.   Anticipate d/c after additional IVFs completed. [KH]    Clinical Course User Index [KH] Beverely Pace   MDM Rules/Calculators/A&P                          69 year old female presents to the emergency department for evaluation after syncopal event at home.  Reports prodromal symptoms of lightheadedness prior to syncope.  Has been observed in the ED for a number of hours without clinical decompensation.  She does have positive orthostatic vital signs without compensation of her heart rate.  This is suspected to be related to her beta-blocker and puts her at increased risk for syncope when she is hypovolemic.  She has been managed in the ED with IV fluids.  Given Tylenol for some soreness at the site where she struck her head, though she denied headache on my assessment and was noted to have no focal neurologic deficit.  She is not chronically anticoagulated.  Patient stable for follow-up with her primary care doctor.  Have encouraged adequate fluid hydration and discussed return precautions.  Patient discharged in stable condition with no unaddressed concerns.   Final Clinical Impression(s) / ED Diagnoses Final diagnoses:  Orthostatic hypotension    Rx / DC Orders ED Discharge Orders    None       Antonietta Breach, PA-C 05/15/20 0456    Orpah Greek, MD 05/15/20 [phone]

## 2020-05-13 NOTE — ED Triage Notes (Signed)
Patient from home, had a syncopal episode with fall and hit back of head.  Patient is CAOx4 at this time.  Patient is orthostatic with BP of 82/68 initially.  Patient is now 64/64 after a couple of minutes after EMS arrival.  Sitting patient was 83/51.  Fluid given and 108/69.  She has received 268mls of fluid.  Patient also having some chest tightness with the syncope and is better with laying supine.  324mg  ASA and 20g LAC.

## 2020-05-14 DIAGNOSIS — I951 Orthostatic hypotension: Secondary | ICD-10-CM | POA: Diagnosis not present

## 2020-05-14 MED ORDER — OXYCODONE-ACETAMINOPHEN 5-325 MG PO TABS
1.0000 | ORAL_TABLET | Freq: Once | ORAL | Status: AC
  Administered 2020-05-14: 1 via ORAL
  Filled 2020-05-14: qty 1

## 2020-05-14 MED ORDER — SODIUM CHLORIDE 0.9 % IV BOLUS
500.0000 mL | Freq: Once | INTRAVENOUS | Status: AC
  Administered 2020-05-14: 500 mL via INTRAVENOUS

## 2020-05-14 MED ORDER — ACETAMINOPHEN 325 MG PO TABS
650.0000 mg | ORAL_TABLET | Freq: Once | ORAL | Status: AC
  Administered 2020-05-14: 650 mg via ORAL
  Filled 2020-05-14: qty 2

## 2020-05-14 NOTE — Discharge Instructions (Addendum)
Your evaluation was generally reassuring, though we suspect your symptoms were brought on by mild dehydration.  Continue to remain well-hydrated by drinking plenty of fluids.  Follow-up with your primary care doctor regarding your visit today.  You may return for any new or concerning symptoms.

## 2020-05-21 ENCOUNTER — Encounter (HOSPITAL_COMMUNITY)
Admission: RE | Admit: 2020-05-21 | Discharge: 2020-05-21 | Disposition: A | Discharge status: Home / Self Care | Payer: Medicare Other | Source: Ambulatory Visit | Attending: Gastroenterology | Admitting: Gastroenterology

## 2020-05-21 ENCOUNTER — Other Ambulatory Visit: Payer: Self-pay

## 2020-05-21 DIAGNOSIS — R112 Nausea with vomiting, unspecified: Secondary | ICD-10-CM | POA: Insufficient documentation

## 2020-05-21 MED ORDER — TECHNETIUM TC 99M SULFUR COLLOID
2.0000 | Freq: Once | INTRAVENOUS | Status: AC | PRN
  Administered 2020-05-21: 2 via INTRAVENOUS

## 2020-05-23 ENCOUNTER — Other Ambulatory Visit: Payer: Self-pay

## 2020-05-23 ENCOUNTER — Ambulatory Visit (INDEPENDENT_AMBULATORY_CARE_PROVIDER_SITE_OTHER): Payer: Medicare Other | Admitting: Family Medicine

## 2020-05-23 ENCOUNTER — Encounter: Payer: Self-pay | Admitting: Family Medicine

## 2020-05-23 VITALS — BP 122/68 | HR 84 | Temp 98.1°F | Resp 14 | Ht 64.0 in | Wt 190.0 lb

## 2020-05-23 DIAGNOSIS — I10 Essential (primary) hypertension: Secondary | ICD-10-CM | POA: Diagnosis not present

## 2020-05-23 DIAGNOSIS — G909 Disorder of the autonomic nervous system, unspecified: Secondary | ICD-10-CM | POA: Diagnosis not present

## 2020-05-23 MED ORDER — OXYCODONE-ACETAMINOPHEN 10-325 MG PO TABS: 1.0000 | ORAL_TABLET | Freq: Three times a day (TID) | ORAL | 0 refills | Status: AC | PRN

## 2020-05-23 MED ORDER — METOCLOPRAMIDE HCL 10 MG PO TABS: 10.0000 mg | ORAL_TABLET | Freq: Three times a day (TID) | ORAL | 2 refills | Status: DC

## 2020-05-24 ENCOUNTER — Other Ambulatory Visit: Payer: Self-pay | Admitting: Family Medicine

## 2020-05-27 NOTE — Progress Notes (Signed)
Subjective:    Patient ID: Janet Peterson, female    DOB: 07/23/1950, 70 y.o.   MRN: [id number]  Hypertension  Abdominal Pain    6/21 Patient was recently admitted to the hospital with suicidal ideation.  She has since been discharged from behavioral health and requested a refill on her oxycodone.  However I wanted to see the patient first prior to refilling a controlled substance.  She is here today with her husband.  They both endorse that she is doing much better.  At the time, she was thinking about her son who has died.  She was having thoughts of wanting to overdose on her medication.  She states that she is no longer having those thoughts.  She is in a much better place.  Her psychiatrist increased her dose of fluvoxamine.  She is using Percocet at night to help her sleep and occasionally during the daytime if she walks a long distance.  She was started on vitamin D for vitamin D deficiency while in the hospital.  Vitamin D level was in the 20s.  At that time, my plan was: Patient states that she is doing better.  I am no longer concerned that she is actively suicidal.  I feel comfortable refilling her Percocet that she has been taking for quite some time.  Both she and her husband state that she is doing better with no concerns.  Therefore I will refill her Percocet, 60 tablets/month.  I will also refill the Roselyn Meier that she is taking as needed for migraines.  I recommended vitamin D 50,000 units weekly for 6 months and then recheck vitamin D level.  Also recommended that she decrease her Prilosec to 40 mg a day and if tolerating this in the future could try switching to Pepcid.  12/14/19 Blood pressure today is well controlled at 110/70. Patient is going on a 1 month vacation across the [place]. However her prescription will expire before she returns home from her vacation. She takes 1 Percocet a day due to pain in her joints and in her neck due to the psoriatic arthritis. She  is asking that I refill her prescription earlier so that she can take it with her. I would be happy to do that. She shows no evidence of abuse or diversion. She is also requesting a heel lift for her right shoe. 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 She benefits from a shoe lift on the right foot to help correct this which helps with hip pain and lower back pain   02/19/20 Patient states her symptoms began approximately 15 days ago.  She was driving down the road and suddenly became extremely nauseated.  Her husband had to pull over and she vomited approximately 6 times.  This occurred suddenly and without warning.  She then felt better however a few days later she started developing dark green stool.  She states that she was also having left-sided abdominal pain.  The pain is sharp.  Tends to be located on the left side.  She denies any dysuria or urgency or frequency.  She denies any hematuria.  She denies any melena or hematochezia.  She denies any reflux-like symptoms.  She denies any heartburn.  She does report constipation.  In fact the first week that the nausea and pain was occurring, the patient states that she was constipated and eventually had a taken Ex-Lax.  She  now states that she is having a bowel movement every day however it has changed in color and is green.  Furthermore she reports nausea as soon as she eats.  At that time, my plan was: I believe the patient's pain is likely due to constipation.  I believe that she may be so constipated that is distending her intestines causing pain and contributing to nausea.  I will obtain an x-ray to evaluate further to confirm my suspicions.  Meanwhile check a CBC to look for leukocytosis along with a CMP.  Other potential causes would be diverticulitis given the location of the pain.  If there is an elevated white blood cell count or the pain intensifies or if the x-ray shows no constipation, I  will treat the patient empirically for possible diverticulitis.  Begin Linzess 145 mcg daily  03/05/20 The x-ray that I obtained showed a large amount of stool in the colon.  Therefore I recommended that she try Linzess.  She did but then she went to the hospital in early October due to abdominal pain.  In the hospital chest x-ray was unremarkable.  CT scan was unremarkable.  CMP and CBC were unremarkable.  Patient was diagnosed with irritable bowel symptoms and given Bentyl for abdominal pain.  She has stopped the Linzess.  She is back to having a bowel movement every 3 or 4 days.  The Bentyl does help with the abdominal pain.  Zofran also helps with the abdominal pain and nausea and she would like to have a prescription for the Zofran.  At that time, my plan was: I believe her abdominal pain is due to a combination of constipation and irritable bowel.  I recommended that she stay on Linzess 145 mcg daily to try to have a bowel movement every day to help prevent the abdominal pain.  She can also certainly use Bentyl for intestinal spasms.  I encouraged her to eat more fiber and drink more water.  I will refill her Zofran.  03/22/20 Patient had to discontinue the Linzess because she felt like it was making her nausea worse.  Since I last saw her she states that the abdominal pains have subsided.  However she does report epigastric discomfort especially after she eats.  She reports nausea after eating.  She is taking the Zofran but she states it takes 2 hours for the Zofran to take effect.  She also reports having a difficult time sleeping.  She states that she stays awake at night unable to fall asleep.  She denies any melena or hematochezia.  She denies any fever or chills.  She denies any vomiting.  She had an EGD in May that revealed a hiatal hernia but was otherwise normal.  She had a colonoscopy in May as well.  She had a CT scan in October that revealed no explanation for her pain.  Therefore I believe  a lot of her symptoms could be related to irritable bowel plus medication side effects.  Of note she is taking Aleve every day for her back pain.  This could potentially be causing gastritis despite the fact she is on omeprazole.  At that time, my plan was: I believe the majority of her symptoms are IBS.  Therefore we need to focus on finding ways to help manage her symptoms.  I think the Aleve could be contributing some to her dyspepsia.  Therefore I recommended that she discontinue Aleve altogether and allow 2 weeks to see if the irritation in  her stomach will improve.  I also recommended that we try premedicating with Phenergan rather than waiting until she becomes nauseated and then taking Zofran.  I chose Phenergan because I am hoping that it will help her sleep better at night.  I recommended that she premedicate with Phenergan at night prior to bed.  Hopefully this will help her fall asleep easier and not be habit-forming.  I hope that this will also be still present somewhat when she wakes up in the morning so that she is not as nauseated in the morning.  She can then take a Phenergan again during the daytime to help with nausea prior to lunch and dinner.  Therefore we will schedule the Phenergan twice daily and see if this helps settle her stomach.  She has an appointment to follow-up with her gastroenterologist in December hopefully we can help alleviate some of her symptoms prior to that.   04/11/20 Unfortunately, the patient has seen no improvement since I saw her last.  She is not having abdominal pain but she still continues to have nausea.  She states that she can eat 5 or 6 bites and she instantly becomes full and feels nauseated.  She has been staying off all NSAIDs and this has not helped.  Phenergan twice a day has not helped as well.  The Bentyl has not helped.  She also had a normal abdominal ultrasound in April.  Therefore work-up today includes a normal abdominal ultrasound in April, and  EGD in colonoscopy in May, CT scan earlier this fall none of which have shown an explanation.  At the present time her biggest symptoms are nausea and early satiety.  She denies any severe fever although she does have night sweats.  At that time, my plan was: Given her dyspepsia, I will screen the patient for H pylori.  This has not been done yet.  Next on the differential diagnosis would be gastroparesis versus biliary dyskinesia.  She has had a normal right upper quadrant ultrasound but she has not had a HIDA scan.  I will empirically treat the patient with Reglan 10 mg before meals over the weekend to see if her symptoms improve.  If they do, I would recommend performing a gastric emptying test.  If they do not, I will try to expedite her GI consultation.  She also started Arava around the same time that symptoms began.  There is a 10% chance this can cause similar symptoms to what she is experiencing.  Therefore I recommended that she reach out to her rheumatologist and touch base with her to see if she feels like this could be playing any role.  05/02/20 Patient continues to experience nausea.  She has seen GI who scheduling her for a gastric emptying study to evaluate for gastroparesis.  She would like to try Compazine as needed for nausea as Zofran is not effective.  Phenergan has not been effective either.  She tried some of her husband's Compazine and that seemed to help.  Her blood pressures also been elevated recently at 150-170/90-100.  However here today her blood pressure is much better.  She just started hydrochlorothiazide 4 days ago.  I do not feel that is had adequate time to take full effect.  At that time, my plan was: I spent more than 30 minutes today with the patient and her husband.  I feel a lot of her symptoms could be due to polypharmacy and additive effect of all the medication she  is taking.  Therefore I am willing to give her Compazine 10 mg every 6 hours as needed however I  encouraged her to take it sparingly.  Also recommended that she stop tizanidine, Seroquel, and oxycodone to avoid anticholinergic and unintended side effects.  I would like her to talk with each of her physicians about trying to wean off medicine gradually to see if some of this could be medication side effect induced.  Meanwhile her blood pressure today is much better than it was 4 days ago.  Allow another week or so for the hydrochlorothiazide to take full effect and then see what her blood pressures are running.  05/23/20 Since I last saw the patient, not only did we have to add hydrochlorothiazide, but we also had to add amlodipine as her systolic blood pressure remained in the 170s.  She also had a gastric emptying study performed by GI which was "normal".  GI has referred her to a general surgeon to correct the hiatal hernia as that is their best guess as to the cause of her nausea.  However the patient continues to be extremely nauseated for no reason.  She states that she would just be sitting there and suddenly developed nausea and have to vomit.  This even occurred today during our encounter.  She was sitting calmly discussing the situation with me and her husband when suddenly she became extremely nauseated and suddenly had to vomit in the sink.  She had copious nonbilious emesis that I witnessed.  She states that this is occurring frequently at home without provocation.  Also recently she had an episode of syncope.  She was walking into the kitchen when she suddenly became dizzy and lost consciousness falling and injuring herself.  She was taken to the hospital where she was diagnosed with orthostatic hypotension due to dehydration and was given IV fluids.  Certainly her vomiting could explain dehydration however her blood pressure has been extremely labile over the last few weeks.  She is still taking amlodipine and her blood pressures at home have been in the 140-150 range without any orthostatic  drops.  She is here today for follow-up.  Past Medical History:  Diagnosis Date  . Ankylosing spondylitis ([place])   . Anxiety and depression   . Depression   . Fibromyalgia   . GERD (gastroesophageal reflux disease)   . Hiatal hernia    per patient, dx by GI   . History of basal cell carcinoma excision    FACE, [place]  . History of hiatal hernia   . History of thrush   . Hyperlipidemia   . Hypertension   . Insomnia   . Left breast mass   . OCD (obsessive compulsive disorder)   . OSA (obstructive sleep apnea)    MILD AND NO CPAP SINCE SURGERY IN 2007  . Osteopenia   . PONV (postoperative nausea and vomiting)   . Psoriatic arthritis ([place])   . PVC (premature ventricular contraction)   . Rheumatoid arthritis [facility])    Past Surgical History:  Procedure Laterality Date  . BIOPSY  09/29/2019   Procedure: BIOPSY;  Surgeon: Ronnette Juniper, MD;  Location: [facility];  Service: Gastroenterology;;  . BREAST LUMPECTOMY WITH RADIOACTIVE SEED LOCALIZATION Left 05/04/2018   Procedure: LEFT BREAST LUMPECTOMY WITH RADIOACTIVE SEED LOCALIZATION AND LEFT BREAST NIPPLE BIOPSY;  Surgeon: Jovita Kussmaul, MD;  Location: [place];  Service: General;  Laterality: Left;  . [place]  lumpectomy-- benign  . BUNIONECTOMY  1991  . CATARACT EXTRACTION W/ INTRAOCULAR LENS  IMPLANT, BILATERAL  2000  . CERVICAL FUSION  March 2013   C5 -- C7  . CESAREAN SECTION  1985  . COLONOSCOPY WITH PROPOFOL N/A 09/29/2019   Procedure: COLONOSCOPY WITH PROPOFOL;  Surgeon: Ronnette Juniper, MD;  Location: [facility];  Service: Gastroenterology;  Laterality: N/A;  . DISTAL INTERPHALANGEAL JOINT FUSION Right 03/14/2015   Procedure: RIGHT LONG FINGER DISTAL INTERPHALANGEAL JOINT ARTHRODESIS;  Surgeon: Iran Planas, MD;  Location: [facility];  Service: Orthopedics;  Laterality: Right;  . ESOPHAGOGASTRODUODENOSCOPY (EGD) WITH PROPOFOL N/A 09/29/2019   Procedure:  ESOPHAGOGASTRODUODENOSCOPY (EGD) WITH PROPOFOL;  Surgeon: Ronnette Juniper, MD;  Location: [facility];  Service: Gastroenterology;  Laterality: N/A;  . EYE SURGERY  1995   rk (laser surgery), semi cornea transplant, detacted retina,  fluid removal  . KNEE ARTHROSCOPY Left 03-03-2004  . POLYPECTOMY  09/29/2019   Procedure: POLYPECTOMY;  Surgeon: Ronnette Juniper, MD;  Location: [facility];  Service: Gastroenterology;;  . POSTERIOR VITRECTOMY RIGHT EYE AND LASER   10-27-1999  . SHOULDER SURGERY Right 1997  . SPINAL FIXATION SURGERY W/ IMPLANT  2013 rod #1//  2014  rod 2   S1 -- T10  (rod #1)//   S1 -- T4 (rod #2)  . THORACIC FUSION  03-13-2013   REMOVAL HARDWARE/  BONE GRAFT FUSION T10//  REVISION OF RODS  . TOTAL KNEE ARTHROPLASTY Left 12-14-2005  . UVULOPALATOPHARYNGOPLASTY  04-26-2006   w/  TONSILLECTOMY/  TURBINATE REDUCTIONS/  BILATERAL ANTERIOR ETHMOIDECTOMY   Current Outpatient Medications on File Prior to Visit  Medication Sig Dispense Refill  . amLODipine (NORVASC) 10 MG tablet Take 1 tablet (10 mg total) by mouth daily. 90 tablet 3  . buPROPion (WELLBUTRIN XL) 300 MG 24 hr tablet Take 300 mg by mouth every morning.  1  . Capsaicin-Menthol (SALONPAS GEL EX) Apply 1 application topically daily as needed (pain).    . clonazePAM (KLONOPIN) 1 MG tablet TAKE 1 TABLET BY MOUTH EVERY DAY AS NEEDED FOR ANXIETY (Patient taking differently: Take 1 mg by mouth daily as needed for anxiety.) 30 tablet 1  . ezetimibe (ZETIA) 10 MG tablet TAKE 1 TABLET BY MOUTH EVERY DAY (Patient taking differently: Take 10 mg by mouth daily after breakfast.) 90 tablet 3  . fluticasone (FLONASE) 50 MCG/ACT nasal spray Place 2 sprays into both nostrils daily as needed for allergies.     . fluvoxaMINE (LUVOX) 100 MG tablet Take 125 mg by mouth in the morning, at noon, and at bedtime.     Marland Kitchen leflunomide (ARAVA) 20 MG tablet Take 1 tablet (20 mg total) by mouth daily. 90 tablet 0  . Menthol-Methyl Salicylate (SALONPAS PAIN  RELIEF PATCH EX) Apply 1 application topically daily as needed (pain).    . metoprolol succinate (TOPROL-XL) 50 MG 24 hr tablet TAKE 1 TABLET BY MOUTH ONCE DAILY FOLLOWING A MEAL (Patient taking differently: Take 50 mg by mouth daily.) 90 tablet 3  . naproxen sodium (ALEVE) 220 MG tablet Take 440 mg by mouth daily as needed (headache).    Marland Kitchen omeprazole (PRILOSEC) 40 MG capsule TAKE 1 CAPSULE BY MOUTH EVERY MORNING AND AT BEDTIME (Patient taking differently: Take 40 mg by mouth in the morning and at bedtime.) 180 capsule 1  . ondansetron (ZOFRAN) 4 MG tablet Take 4 mg by mouth 4 (four) times daily as needed for nausea.     Marland Kitchen oxyCODONE-acetaminophen (PERCOCET) 5-325 MG tablet Take 1  tablet by mouth at bedtime. 60 tablet 0  . Probiotic Product (PROBIOTIC PO) Take 1 tablet by mouth daily after breakfast.    . prochlorperazine (COMPAZINE) 10 MG tablet Take 1 tablet (10 mg total) by mouth every 6 (six) hours as needed for nausea or vomiting. 30 tablet 0  . QUEtiapine (SEROQUEL) 25 MG tablet Take 25 mg by mouth at bedtime.     Marland Kitchen tiZANidine (ZANAFLEX) 4 MG tablet TAKE 1 TABLET (4 MG TOTAL) BY MOUTH EVERY 6 (SIX) HOURS AS NEEDED FOR MUSCLE SPASMS. 30 tablet 0  . valsartan (DIOVAN) 320 MG tablet TAKE 1 TABLET BY MOUTH EVERY DAY (Patient taking differently: Take 320 mg by mouth daily.) 90 tablet 3  . Vitamin D, Ergocalciferol, (DRISDOL) 1.25 MG (50000 UNIT) CAPS capsule Take 1 capsule (50,000 Units total) by mouth every 7 (seven) days. (Patient taking differently: Take 50,000 Units by mouth every Friday.) 5 capsule 5   No current facility-administered medications on file prior to visit.   Allergies  Allergen Reactions  . Latex Rash    And Mouth sores  . Amlodipine Besy-Benazepril Hcl Other (See Comments)    COUGH  . Augmentin [Amoxicillin-Pot Clavulanate] Diarrhea and Nausea And Vomiting  . Bextra [Valdecoxib] Diarrhea and Nausea And Vomiting    REFLUX  . Chlorhexidine     rash  . Lipitor [Atorvastatin  Calcium] Other (See Comments)    MYALGIAS  . Sulfa Antibiotics Nausea And Vomiting    CRAMPING  . Zocor [Simvastatin] Other (See Comments)    MYALGIA   Social History   Socioeconomic History  . Marital status: Married    Spouse name: Not on file  . Number of children: Not on file  . Years of education: Not on file  . Highest education level: Not on file  Occupational History  . Not on file  Tobacco Use  . Smoking status: Former Smoker    Packs/day: 0.50    Years: 10.00    Pack years: 5.00    Types: Cigarettes    Quit date: 03/07/1995    Years since quitting: 25.2  . Smokeless tobacco: Never Used  Vaping Use  . Vaping Use: Never used  Substance and Sexual Activity  . Alcohol use: Yes    Comment: rare  . Drug use: Never  . Sexual activity: Not on file  Other Topics Concern  . Not on file  Social History Narrative  . Not on file   Social Determinants of Health   Financial Resource Strain: Not on file  Food Insecurity: Not on file  Transportation Needs: Not on file  Physical Activity: Not on file  Stress: Not on file  Social Connections: Not on file  Intimate Partner Violence: Not on file    Review of Systems  Gastrointestinal: Positive for abdominal pain.  All other systems reviewed and are negative.      Objective:   Physical Exam Vitals reviewed.  Constitutional:      General: She is not in acute distress.    Appearance: Normal appearance. She is normal weight. She is not ill-appearing or toxic-appearing.  Cardiovascular:     Rate and Rhythm: Normal rate and regular rhythm.     Heart sounds: Normal heart sounds. No murmur heard. No friction rub. No gallop.   Pulmonary:     Effort: Pulmonary effort is normal. No respiratory distress.     Breath sounds: Normal breath sounds. No wheezing, rhonchi or rales.  Abdominal:     General: Abdomen  is flat. Bowel sounds are normal. There is no distension.     Palpations: Abdomen is soft.     Tenderness: There  is no abdominal tenderness.  Musculoskeletal:     Right lower leg: No edema.     Left lower leg: No edema.  Neurological:     Mental Status: She is alert.           Assessment & Plan:  Autonomic dysfunction  Benign essential HTN  Patient has now developed 2 symptoms concurrently.  She is having constant nausea and vomiting.  Extensive work-up thus far has been unrevealing.  However I am not certain that a hiatal hernia would cause sudden emesis without provocation.  She really denies any heartburn at the present time.  Also, her blood pressure has become very difficult to control recently.  She is had extremely high blood pressures which have never been an issue for her in the past.  After adding 2 separate medications, hydrochlorothiazide and amlodipine, her blood pressure is now in a reasonable range.  However she is also having sudden drops in her blood pressure leading to falls and syncope.  This could either be due to dehydration or vasovagal events.  However given the concurrence with her nausea I am starting to worry about possible autonomic dysfunction.  I would like to arrange a second opinion with neurology particular before she has surgery for a hiatal hernia as I am not certain that the hiatal hernia could be explaining her nausea.  I also do not believe that this is psychosomatic as I have witnessed the vomiting personally in the exam room.  Despite a normal gastric emptying study, I am going to try the patient on Reglan 10 mg p.o. q. ACH S to see if she notices any improvement.  Continue hydrochlorothiazide as well as amlodipine.  Monitor blood pressure frequently throughout the day.  If blood pressure continues to drop, we will need to discontinue medication as needed.  I did give the patient a one-time prescription for oxycodone 10 mg/325 to be used sparingly as she injured herself in her fall and is having pain at the base of her neck and in her back after the fall despite normal  imaging.  She has been having to use more of her Percocet 09/25/2023 and therefore will run out early.  Her insurance will not cover prescription sooner unless I change the dose.  Therefore I will change the dose to 10/325 and she can break these in half and use them as needed for breakthrough pain.  This is a one-time isolated occurrence due to her fall and the contusion she suffered to her back and neck and head were evaluated in the emergency room.

## 2020-05-29 DIAGNOSIS — M25512 Pain in left shoulder: Secondary | ICD-10-CM | POA: Insufficient documentation

## 2020-05-29 DIAGNOSIS — M542 Cervicalgia: Secondary | ICD-10-CM | POA: Diagnosis not present

## 2020-05-29 DIAGNOSIS — R0781 Pleurodynia: Secondary | ICD-10-CM | POA: Diagnosis not present

## 2020-06-04 ENCOUNTER — Other Ambulatory Visit: Payer: Self-pay

## 2020-06-04 DIAGNOSIS — G909 Disorder of the autonomic nervous system, unspecified: Secondary | ICD-10-CM

## 2020-06-07 DIAGNOSIS — K449 Diaphragmatic hernia without obstruction or gangrene: Secondary | ICD-10-CM | POA: Diagnosis not present

## 2020-06-09 IMAGING — MG STEREOTACTIC CORE NEEDLE BIOPSY
8 of 9 series · 8 of 17 positions shown · non-contrast
Comparison: Previous exams.

Addendum:
CLINICAL DATA: Patient with indeterminate left breast distortion.

EXAM:
LEFT BREAST STEREOTACTIC CORE NEEDLE BIOPSY

[L (1 of 6)]
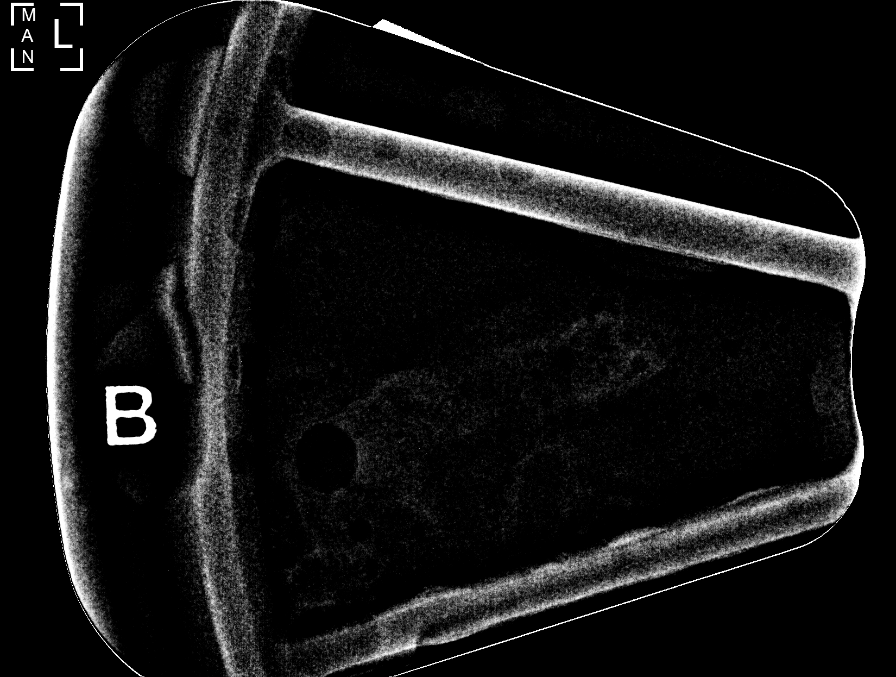

[L (2 of 6)]
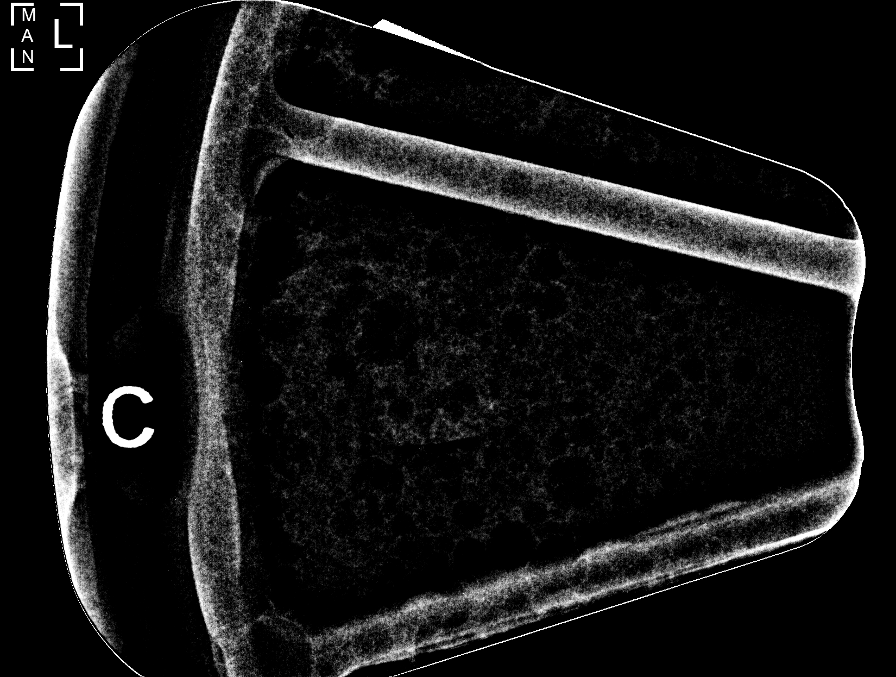

[L (3 of 6)]
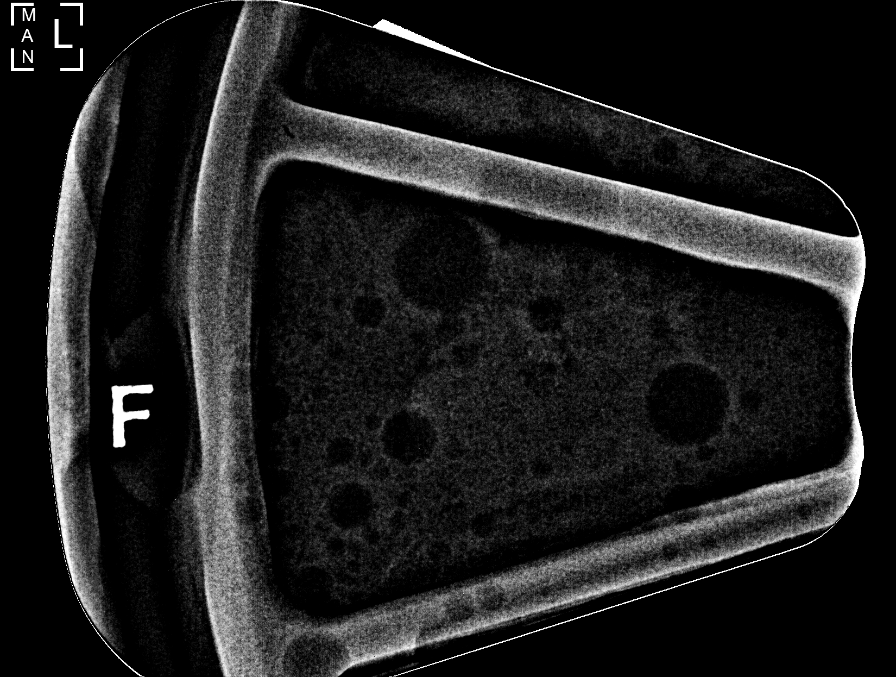

[L (4 of 6)]
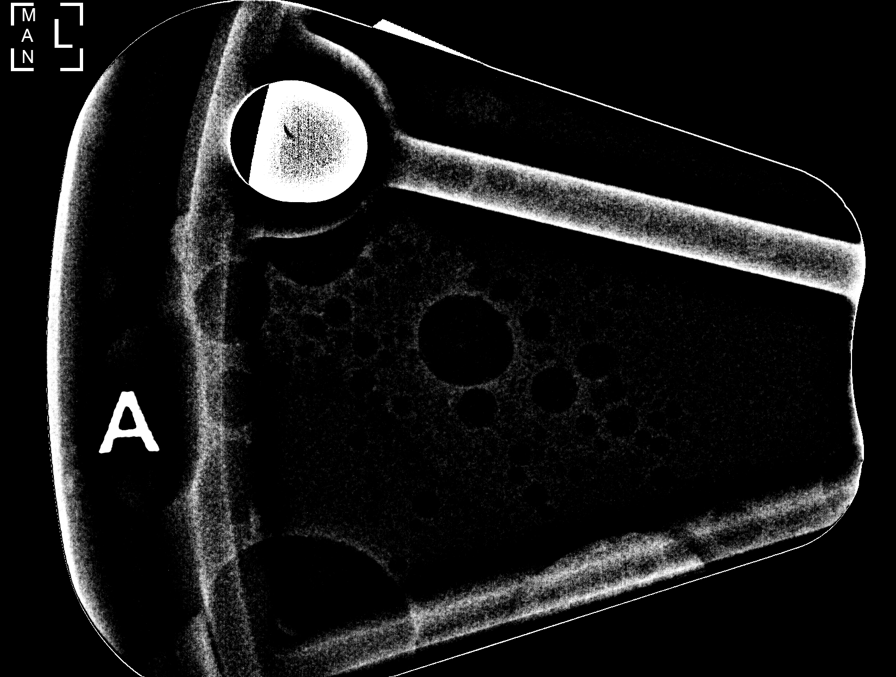

[L (5 of 6)]
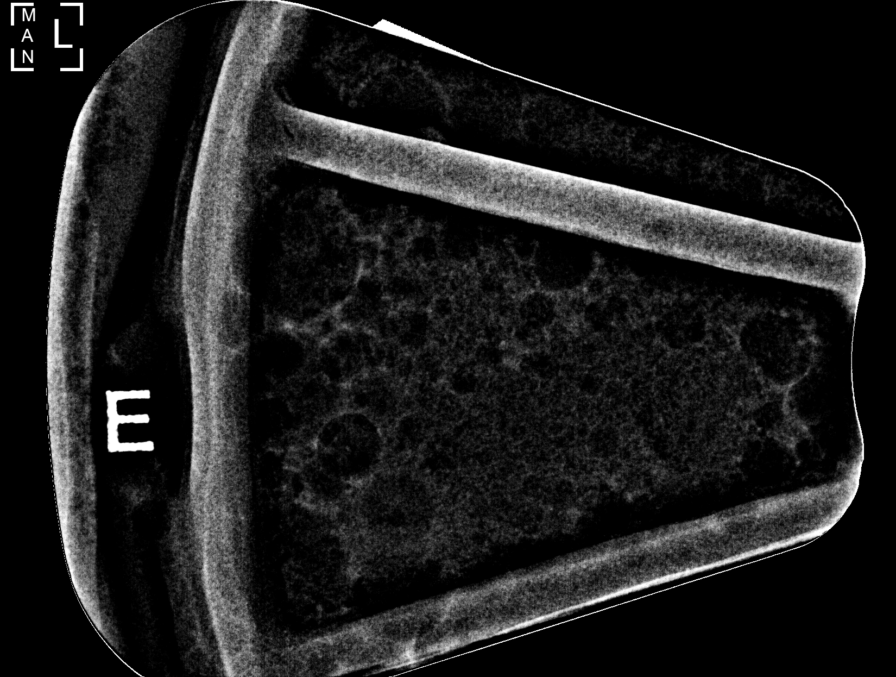

[L (6 of 6)]
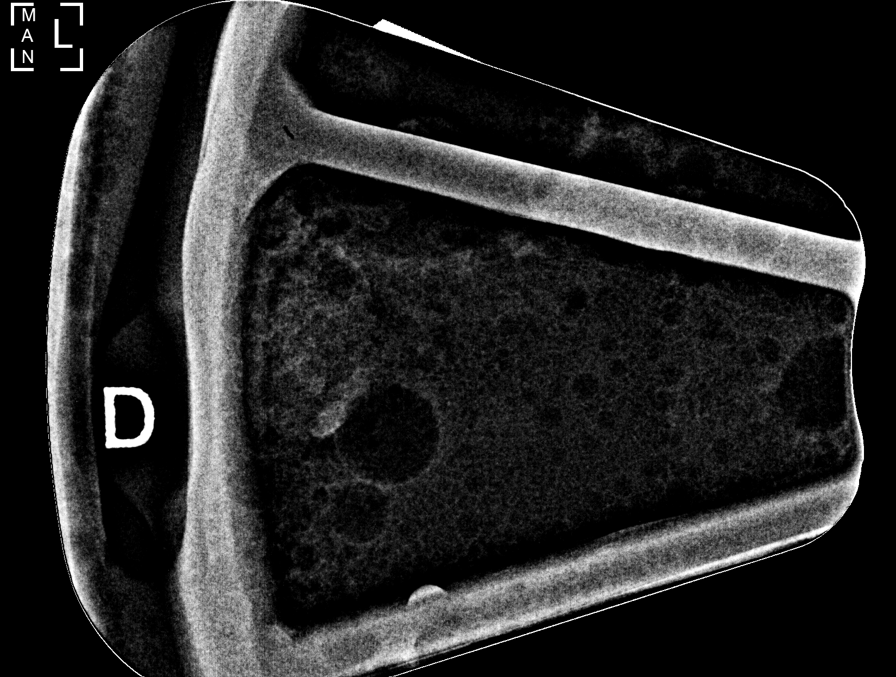

[L ML]
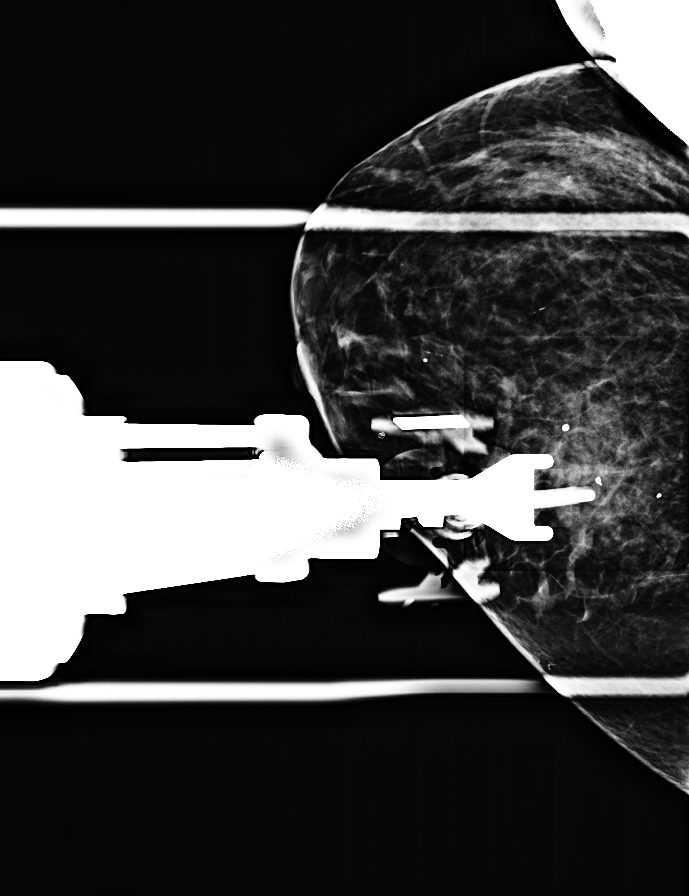

[L ML tomo · tomo slice 29/56.0]
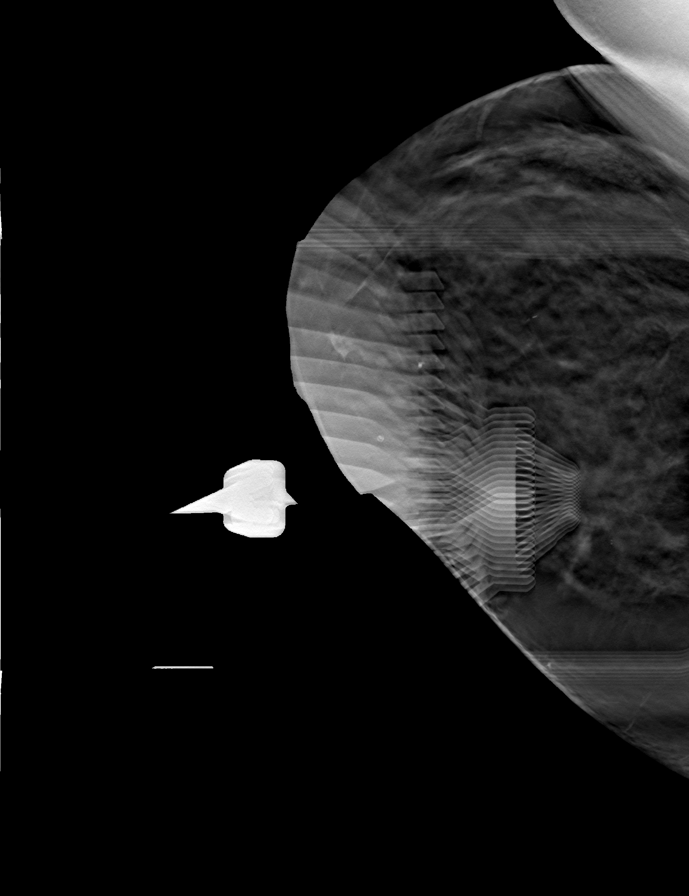

[8 of 17 positions shown; findings below may reference images not displayed]



Using sterile technique and 1% Lidocaine as local anesthetic, under
stereotactic guidance, a 9 gauge vacuum assisted device was used to
perform core needle biopsy of distortion within the upper inner left
breast using a medial approach.

Lesion quadrant: Upper inner quadrant

At the conclusion of the procedure, a coil shaped tissue marker clip
was deployed into the biopsy cavity. Follow-up 2-view mammogram was
performed and dictated separately.
IMPRESSION: Stereotactic-guided biopsy of left breast distortion. No apparent
complications.

ADDENDUM:
Pathology revealed COMPLEX SCLEROSING LESION WITH FOCAL ATYPIA of
the Left breast, medial. This was found to be concordant by Dr. Bill
Browndon, with excision recommended.

Pathology results were discussed with the patient by telephone. The
patient reported doing well after the biopsy with tenderness and
minimal bleeding at the site. Post biopsy instructions and care were
reviewed and questions were answered. The patient was encouraged to
call The [REDACTED] for any additional
concerns.

Surgical consultation has been arranged with Dr. Taran Threatt at
[REDACTED] on April 15, 2018.

Pathology results reported by Alaeldin Siralkhatim, RN on 04/05/2018.

*** End of Addendum ***

## 2020-06-25 ENCOUNTER — Ambulatory Visit (INDEPENDENT_AMBULATORY_CARE_PROVIDER_SITE_OTHER): Payer: Medicare Other | Admitting: Family Medicine

## 2020-06-25 ENCOUNTER — Other Ambulatory Visit: Payer: Self-pay

## 2020-06-25 VITALS — BP 110/60 | HR 78 | Ht 64.0 in | Wt 192.0 lb

## 2020-06-25 DIAGNOSIS — G2401 Drug induced subacute dyskinesia: Secondary | ICD-10-CM | POA: Diagnosis not present

## 2020-06-25 NOTE — Progress Notes (Signed)
Subjective:    Patient ID: Janet Peterson, female    DOB: 07/23/1950, 70 y.o.   MRN: [id number]  Hypertension  Abdominal Pain    6/21 Patient was recently admitted to the hospital with suicidal ideation.  She has since been discharged from behavioral health and requested a refill on her oxycodone.  However I wanted to see the patient first prior to refilling a controlled substance.  She is here today with her husband.  They both endorse that she is doing much better.  At the time, she was thinking about her son who has died.  She was having thoughts of wanting to overdose on her medication.  She states that she is no longer having those thoughts.  She is in a much better place.  Her psychiatrist increased her dose of fluvoxamine.  She is using Percocet at night to help her sleep and occasionally during the daytime if she walks a long distance.  She was started on vitamin D for vitamin D deficiency while in the hospital.  Vitamin D level was in the 20s.  At that time, my plan was: Patient states that she is doing better.  I am no longer concerned that she is actively suicidal.  I feel comfortable refilling her Percocet that she has been taking for quite some time.  Both she and her husband state that she is doing better with no concerns.  Therefore I will refill her Percocet, 60 tablets/month.  I will also refill the Roselyn Meier that she is taking as needed for migraines.  I recommended vitamin D 50,000 units weekly for 6 months and then recheck vitamin D level.  Also recommended that she decrease her Prilosec to 40 mg a day and if tolerating this in the future could try switching to Pepcid.  12/14/19 Blood pressure today is well controlled at 110/70. Patient is going on a 1 month vacation across the [place]. However her prescription will expire before she returns home from her vacation. She takes 1 Percocet a day due to pain in her joints and in her neck due to the psoriatic arthritis. She  is asking that I refill her prescription earlier so that she can take it with her. I would be happy to do that. She shows no evidence of abuse or diversion. She is also requesting a heel lift for her right shoe. 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 She benefits from a shoe lift on the right foot to help correct this which helps with hip pain and lower back pain   02/19/20 Patient states her symptoms began approximately 15 days ago.  She was driving down the road and suddenly became extremely nauseated.  Her husband had to pull over and she vomited approximately 6 times.  This occurred suddenly and without warning.  She then felt better however a few days later she started developing dark green stool.  She states that she was also having left-sided abdominal pain.  The pain is sharp.  Tends to be located on the left side.  She denies any dysuria or urgency or frequency.  She denies any hematuria.  She denies any melena or hematochezia.  She denies any reflux-like symptoms.  She denies any heartburn.  She does report constipation.  In fact the first week that the nausea and pain was occurring, the patient states that she was constipated and eventually had a taken Ex-Lax.  She  now states that she is having a bowel movement every day however it has changed in color and is green.  Furthermore she reports nausea as soon as she eats.  At that time, my plan was: I believe the patient's pain is likely due to constipation.  I believe that she may be so constipated that is distending her intestines causing pain and contributing to nausea.  I will obtain an x-ray to evaluate further to confirm my suspicions.  Meanwhile check a CBC to look for leukocytosis along with a CMP.  Other potential causes would be diverticulitis given the location of the pain.  If there is an elevated white blood cell count or the pain intensifies or if the x-ray shows no constipation, I  will treat the patient empirically for possible diverticulitis.  Begin Linzess 145 mcg daily  03/05/20 The x-ray that I obtained showed a large amount of stool in the colon.  Therefore I recommended that she try Linzess.  She did but then she went to the hospital in early October due to abdominal pain.  In the hospital chest x-ray was unremarkable.  CT scan was unremarkable.  CMP and CBC were unremarkable.  Patient was diagnosed with irritable bowel symptoms and given Bentyl for abdominal pain.  She has stopped the Linzess.  She is back to having a bowel movement every 3 or 4 days.  The Bentyl does help with the abdominal pain.  Zofran also helps with the abdominal pain and nausea and she would like to have a prescription for the Zofran.  At that time, my plan was: I believe her abdominal pain is due to a combination of constipation and irritable bowel.  I recommended that she stay on Linzess 145 mcg daily to try to have a bowel movement every day to help prevent the abdominal pain.  She can also certainly use Bentyl for intestinal spasms.  I encouraged her to eat more fiber and drink more water.  I will refill her Zofran.  03/22/20 Patient had to discontinue the Linzess because she felt like it was making her nausea worse.  Since I last saw her she states that the abdominal pains have subsided.  However she does report epigastric discomfort especially after she eats.  She reports nausea after eating.  She is taking the Zofran but she states it takes 2 hours for the Zofran to take effect.  She also reports having a difficult time sleeping.  She states that she stays awake at night unable to fall asleep.  She denies any melena or hematochezia.  She denies any fever or chills.  She denies any vomiting.  She had an EGD in May that revealed a hiatal hernia but was otherwise normal.  She had a colonoscopy in May as well.  She had a CT scan in October that revealed no explanation for her pain.  Therefore I believe  a lot of her symptoms could be related to irritable bowel plus medication side effects.  Of note she is taking Aleve every day for her back pain.  This could potentially be causing gastritis despite the fact she is on omeprazole.  At that time, my plan was: I believe the majority of her symptoms are IBS.  Therefore we need to focus on finding ways to help manage her symptoms.  I think the Aleve could be contributing some to her dyspepsia.  Therefore I recommended that she discontinue Aleve altogether and allow 2 weeks to see if the irritation in  her stomach will improve.  I also recommended that we try premedicating with Phenergan rather than waiting until she becomes nauseated and then taking Zofran.  I chose Phenergan because I am hoping that it will help her sleep better at night.  I recommended that she premedicate with Phenergan at night prior to bed.  Hopefully this will help her fall asleep easier and not be habit-forming.  I hope that this will also be still present somewhat when she wakes up in the morning so that she is not as nauseated in the morning.  She can then take a Phenergan again during the daytime to help with nausea prior to lunch and dinner.  Therefore we will schedule the Phenergan twice daily and see if this helps settle her stomach.  She has an appointment to follow-up with her gastroenterologist in December hopefully we can help alleviate some of her symptoms prior to that.   04/11/20 Unfortunately, the patient has seen no improvement since I saw her last.  She is not having abdominal pain but she still continues to have nausea.  She states that she can eat 5 or 6 bites and she instantly becomes full and feels nauseated.  She has been staying off all NSAIDs and this has not helped.  Phenergan twice a day has not helped as well.  The Bentyl has not helped.  She also had a normal abdominal ultrasound in April.  Therefore work-up today includes a normal abdominal ultrasound in April, and  EGD in colonoscopy in May, CT scan earlier this fall none of which have shown an explanation.  At the present time her biggest symptoms are nausea and early satiety.  She denies any severe fever although she does have night sweats.  At that time, my plan was: Given her dyspepsia, I will screen the patient for H pylori.  This has not been done yet.  Next on the differential diagnosis would be gastroparesis versus biliary dyskinesia.  She has had a normal right upper quadrant ultrasound but she has not had a HIDA scan.  I will empirically treat the patient with Reglan 10 mg before meals over the weekend to see if her symptoms improve.  If they do, I would recommend performing a gastric emptying test.  If they do not, I will try to expedite her GI consultation.  She also started Arava around the same time that symptoms began.  There is a 10% chance this can cause similar symptoms to what she is experiencing.  Therefore I recommended that she reach out to her rheumatologist and touch base with her to see if she feels like this could be playing any role.  05/02/20 Patient continues to experience nausea.  She has seen GI who scheduling her for a gastric emptying study to evaluate for gastroparesis.  She would like to try Compazine as needed for nausea as Zofran is not effective.  Phenergan has not been effective either.  She tried some of her husband's Compazine and that seemed to help.  Her blood pressures also been elevated recently at 150-170/90-100.  However here today her blood pressure is much better.  She just started hydrochlorothiazide 4 days ago.  I do not feel that is had adequate time to take full effect.  At that time, my plan was: I spent more than 30 minutes today with the patient and her husband.  I feel a lot of her symptoms could be due to polypharmacy and additive effect of all the medication she  is taking.  Therefore I am willing to give her Compazine 10 mg every 6 hours as needed however I  encouraged her to take it sparingly.  Also recommended that she stop tizanidine, Seroquel, and oxycodone to avoid anticholinergic and unintended side effects.  I would like her to talk with each of her physicians about trying to wean off medicine gradually to see if some of this could be medication side effect induced.  Meanwhile her blood pressure today is much better than it was 4 days ago.  Allow another week or so for the hydrochlorothiazide to take full effect and then see what her blood pressures are running.  05/23/20 Since I last saw the patient, not only did we have to add hydrochlorothiazide, but we also had to add amlodipine as her systolic blood pressure remained in the 170s.  She also had a gastric emptying study performed by GI which was "normal".  GI has referred her to a general surgeon to correct the hiatal hernia as that is their best guess as to the cause of her nausea.  However the patient continues to be extremely nauseated for no reason.  She states that she would just be sitting there and suddenly developed nausea and have to vomit.  This even occurred today during our encounter.  She was sitting calmly discussing the situation with me and her husband when suddenly she became extremely nauseated and suddenly had to vomit in the sink.  She had copious nonbilious emesis that I witnessed.  She states that this is occurring frequently at home without provocation.  Also recently she had an episode of syncope.  She was walking into the kitchen when she suddenly became dizzy and lost consciousness falling and injuring herself.  She was taken to the hospital where she was diagnosed with orthostatic hypotension due to dehydration and was given IV fluids.  Certainly her vomiting could explain dehydration however her blood pressure has been extremely labile over the last few weeks.  She is still taking amlodipine and her blood pressures at home have been in the 140-150 range without any orthostatic  drops.  She is here today for follow-up.  At that time, my plan was: Patient has now developed 2 symptoms concurrently.  She is having constant nausea and vomiting.  Extensive work-up thus far has been unrevealing.  However I am not certain that a hiatal hernia would cause sudden emesis without provocation.  She really denies any heartburn at the present time.  Also, her blood pressure has become very difficult to control recently.  She is had extremely high blood pressures which have never been an issue for her in the past.  After adding 2 separate medications, hydrochlorothiazide and amlodipine, her blood pressure is now in a reasonable range.  However she is also having sudden drops in her blood pressure leading to falls and syncope.  This could either be due to dehydration or vasovagal events.  However given the concurrence with her nausea I am starting to worry about possible autonomic dysfunction.  I would like to arrange a second opinion with neurology particular before she has surgery for a hiatal hernia as I am not certain that the hiatal hernia could be explaining her nausea.  I also do not believe that this is psychosomatic as I have witnessed the vomiting personally in the exam room.  Despite a normal gastric emptying study, I am going to try the patient on Reglan 10 mg p.o. q. ACH S to see  if she notices any improvement.  Continue hydrochlorothiazide as well as amlodipine.  Monitor blood pressure frequently throughout the day.  If blood pressure continues to drop, we will need to discontinue medication as needed.  I did give the patient a one-time prescription for oxycodone 10 mg/325 to be used sparingly as she injured herself in her fall and is having pain at the base of her neck and in her back after the fall despite normal imaging.  She has been having to use more of her Percocet 09/25/2023 and therefore will run out early.  Her insurance will not cover prescription sooner unless I change the dose.   Therefore I will change the dose to 10/325 and she can break these in half and use them as needed for breakthrough pain.  This is a one-time isolated occurrence due to her fall and the contusion she suffered to her back and neck and head were evaluated in the emergency room.   06/25/20 Patient never saw neurology.  She states that they never called her.  However thankfully, the nausea has improved.  She is not sure if she is taking the Reglan.  She does not believe that she is.  She states that she is not taking the Compazine anymore.  She is taking Seroquel at night.  She states that over the last several months she has been developed uncontrollable muscle movements particularly in her mouth and tongue.  She states that her tongue and her mouth which is start moving without her knowledge.  If she focuses she can control it and get it to stop however she will notice that is happening without her control.  She is also reports uncontrolled muscle movements in her feet.  She states that her feet will start twitching for no reason on both sides.  If she focuses again she can control it and stop it however when she is not focusing, she notices it.  The movements do not sound like jerking or tremor.  Instead they sound more like dyskinesia.  I suspect that this could be due to medications that she is taking such as the Compazine and Seroquel and Reglan  Past Medical History:  Diagnosis Date  . Ankylosing spondylitis ([place])   . Anxiety and depression   . Depression   . Fibromyalgia   . GERD (gastroesophageal reflux disease)   . Hiatal hernia    per patient, dx by GI   . History of basal cell carcinoma excision    FACE, [place]  . History of hiatal hernia   . History of thrush   . Hyperlipidemia   . Hypertension   . Insomnia   . Left breast mass   . OCD (obsessive compulsive disorder)   . OSA (obstructive sleep apnea)    MILD AND NO CPAP SINCE SURGERY IN 2007  . Osteopenia   . PONV  (postoperative nausea and vomiting)   . Psoriatic arthritis ([place])   . PVC (premature ventricular contraction)   . Rheumatoid arthritis [facility])    Past Surgical History:  Procedure Laterality Date  . BIOPSY  09/29/2019   Procedure: BIOPSY;  Surgeon: Ronnette Juniper, MD;  Location: [facility];  Service: Gastroenterology;;  . BREAST LUMPECTOMY WITH RADIOACTIVE SEED LOCALIZATION Left 05/04/2018   Procedure: LEFT BREAST LUMPECTOMY WITH RADIOACTIVE SEED LOCALIZATION AND LEFT BREAST NIPPLE BIOPSY;  Surgeon: Jovita Kussmaul, MD;  Location: [place];  Service: General;  Laterality: Left;  . [place]  lumpectomy-- benign  . BUNIONECTOMY  1991  . CATARACT EXTRACTION W/ INTRAOCULAR LENS  IMPLANT, BILATERAL  2000  . CERVICAL FUSION  March 2013   C5 -- C7  . CESAREAN SECTION  1985  . COLONOSCOPY WITH PROPOFOL N/A 09/29/2019   Procedure: COLONOSCOPY WITH PROPOFOL;  Surgeon: Ronnette Juniper, MD;  Location: [facility];  Service: Gastroenterology;  Laterality: N/A;  . DISTAL INTERPHALANGEAL JOINT FUSION Right 03/14/2015   Procedure: RIGHT LONG FINGER DISTAL INTERPHALANGEAL JOINT ARTHRODESIS;  Surgeon: Iran Planas, MD;  Location: [place];  Service: Orthopedics;  Laterality: Right;  . ESOPHAGOGASTRODUODENOSCOPY (EGD) WITH PROPOFOL N/A 09/29/2019   Procedure: ESOPHAGOGASTRODUODENOSCOPY (EGD) WITH PROPOFOL;  Surgeon: Ronnette Juniper, MD;  Location: [facility];  Service: Gastroenterology;  Laterality: N/A;  . EYE SURGERY  1995   rk (laser surgery), semi cornea transplant, detacted retina,  fluid removal  . KNEE ARTHROSCOPY Left 03-03-2004  . POLYPECTOMY  09/29/2019   Procedure: POLYPECTOMY;  Surgeon: Ronnette Juniper, MD;  Location: [facility];  Service: Gastroenterology;;  . POSTERIOR VITRECTOMY RIGHT EYE AND LASER   10-27-1999  . SHOULDER SURGERY Right 1997  . SPINAL FIXATION SURGERY W/ IMPLANT  2013 rod #1//  2014  rod 2   S1 -- T10  (rod #1)//   S1 -- T4 (rod #2)  .  THORACIC FUSION  03-13-2013   REMOVAL HARDWARE/  BONE GRAFT FUSION T10//  REVISION OF RODS  . TOTAL KNEE ARTHROPLASTY Left 12-14-2005  . UVULOPALATOPHARYNGOPLASTY  04-26-2006   w/  TONSILLECTOMY/  TURBINATE REDUCTIONS/  BILATERAL ANTERIOR ETHMOIDECTOMY   Current Outpatient Medications on File Prior to Visit  Medication Sig Dispense Refill  . amLODipine (NORVASC) 10 MG tablet Take 1 tablet (10 mg total) by mouth daily. 90 tablet 3  . buPROPion (WELLBUTRIN XL) 300 MG 24 hr tablet Take 300 mg by mouth every morning.  1  . Capsaicin-Menthol (SALONPAS GEL EX) Apply 1 application topically daily as needed (pain).    . clonazePAM (KLONOPIN) 1 MG tablet TAKE 1 TABLET BY MOUTH EVERY DAY AS NEEDED FOR ANXIETY (Patient taking differently: Take 1 mg by mouth daily as needed for anxiety.) 30 tablet 1  . ezetimibe (ZETIA) 10 MG tablet TAKE 1 TABLET BY MOUTH EVERY DAY (Patient taking differently: Take 10 mg by mouth daily after breakfast.) 90 tablet 3  . fluticasone (FLONASE) 50 MCG/ACT nasal spray Place 2 sprays into both nostrils daily as needed for allergies.     . fluvoxaMINE (LUVOX) 100 MG tablet Take 125 mg by mouth in the morning, at noon, and at bedtime.     Marland Kitchen leflunomide (ARAVA) 20 MG tablet Take 1 tablet (20 mg total) by mouth daily. 90 tablet 0  . Menthol-Methyl Salicylate (SALONPAS PAIN RELIEF PATCH EX) Apply 1 application topically daily as needed (pain).    . metoprolol succinate (TOPROL-XL) 50 MG 24 hr tablet TAKE 1 TABLET BY MOUTH ONCE DAILY FOLLOWING A MEAL (Patient taking differently: Take 50 mg by mouth daily.) 90 tablet 3  . naproxen sodium (ALEVE) 220 MG tablet Take 440 mg by mouth daily as needed (headache).    Marland Kitchen omeprazole (PRILOSEC) 40 MG capsule TAKE 1 CAPSULE BY MOUTH EVERY MORNING AND AT BEDTIME (Patient taking differently: Take 40 mg by mouth in the morning and at bedtime.) 180 capsule 1  . oxyCODONE-acetaminophen (PERCOCET) 5-325 MG tablet Take 1 tablet by mouth at bedtime. 60  tablet 0  . Probiotic Product (PROBIOTIC PO) Take 1 tablet by mouth daily after breakfast.    .  QUEtiapine (SEROQUEL) 25 MG tablet Take 25 mg by mouth at bedtime.     Marland Kitchen tiZANidine (ZANAFLEX) 4 MG tablet TAKE 1 TABLET (4 MG TOTAL) BY MOUTH EVERY 6 (SIX) HOURS AS NEEDED FOR MUSCLE SPASMS. 30 tablet 0  . Vitamin D, Ergocalciferol, (DRISDOL) 1.25 MG (50000 UNIT) CAPS capsule TAKE 1 CAPSULE BY MOUTH EVERY 7 DAYS 5 capsule 5  . metoCLOPramide (REGLAN) 10 MG tablet Take 1 tablet (10 mg total) by mouth 4 (four) times daily -  before meals and at bedtime. (Patient not taking: Reported on 06/25/2020) 120 tablet 2  . ondansetron (ZOFRAN) 4 MG tablet Take 4 mg by mouth 4 (four) times daily as needed for nausea.  (Patient not taking: Reported on 06/25/2020)    . prochlorperazine (COMPAZINE) 10 MG tablet Take 1 tablet (10 mg total) by mouth every 6 (six) hours as needed for nausea or vomiting. (Patient not taking: Reported on 06/25/2020) 30 tablet 0  . valsartan (DIOVAN) 320 MG tablet TAKE 1 TABLET BY MOUTH EVERY DAY (Patient not taking: Reported on 06/25/2020) 90 tablet 3   No current facility-administered medications on file prior to visit.   Allergies  Allergen Reactions  . Latex Rash    And Mouth sores  . Amlodipine Besy-Benazepril Hcl Other (See Comments)    COUGH  . Augmentin [Amoxicillin-Pot Clavulanate] Diarrhea and Nausea And Vomiting  . Bextra [Valdecoxib] Diarrhea and Nausea And Vomiting    REFLUX  . Chlorhexidine     rash  . Lipitor [Atorvastatin Calcium] Other (See Comments)    MYALGIAS  . Sulfa Antibiotics Nausea And Vomiting    CRAMPING  . Zocor [Simvastatin] Other (See Comments)    MYALGIA   Social History   Socioeconomic History  . Marital status: Married    Spouse name: Not on file  . Number of children: Not on file  . Years of education: Not on file  . Highest education level: Not on file  Occupational History  . Not on file  Tobacco Use  . Smoking status: Former Smoker     Packs/day: 0.50    Years: 10.00    Pack years: 5.00    Types: Cigarettes    Quit date: 03/07/1995    Years since quitting: 25.3  . Smokeless tobacco: Never Used  Vaping Use  . Vaping Use: Never used  Substance and Sexual Activity  . Alcohol use: Yes    Comment: rare  . Drug use: Never  . Sexual activity: Not on file  Other Topics Concern  . Not on file  Social History Narrative  . Not on file   Social Determinants of Health   Financial Resource Strain: Not on file  Food Insecurity: Not on file  Transportation Needs: Not on file  Physical Activity: Not on file  Stress: Not on file  Social Connections: Not on file  Intimate Partner Violence: Not on file    Review of Systems  Gastrointestinal: Positive for abdominal pain.  All other systems reviewed and are negative.      Objective:   Physical Exam Vitals reviewed.  Constitutional:      General: She is not in acute distress.    Appearance: Normal appearance. She is normal weight. She is not ill-appearing or toxic-appearing.  Cardiovascular:     Rate and Rhythm: Normal rate and regular rhythm.     Heart sounds: Normal heart sounds. No murmur heard. No friction rub. No gallop.   Pulmonary:     Effort: Pulmonary effort  is normal. No respiratory distress.     Breath sounds: Normal breath sounds. No wheezing, rhonchi or rales.  Abdominal:     General: Abdomen is flat. Bowel sounds are normal. There is no distension.     Palpations: Abdomen is soft.     Tenderness: There is no abdominal tenderness.  Musculoskeletal:     Right lower leg: No edema.     Left lower leg: No edema.  Neurological:     Mental Status: She is alert.           Assessment & Plan:  Tardive dyskinesia  I am very thankful that the nausea has improved.  However I am concerned that the centrally acting dopamine medications that she is taken recently may have triggered tardive dyskinesia.  Therefore I want to remove all these medications from  her system to see if the symptoms improve.  Therefore of asked her to make sure that she stopped Reglan, Seroquel, and Compazine.  Meanwhile I will consult neurology regarding the issues listed above such as autonomic dysregulation and also tardive dyskinesia.  Patient also admits to being short of breath however this is been a 2-year issue for the patient.  She denies any angina or orthopnea.  She reports easy fatigability and shortness of breath going up steps.  Echocardiogram in 2020 was normal.  Given the fact that this is been a chronic problem, I believe most of this could be deconditioning.  She has no emergency symptoms that suggest angina.  Therefore I recommended starting a regular exercise program just walking at a slow steady pace for 5 minutes a day.  I would like her to gradually increase her exercise to build up her conditioning.  If she develops chest pain or worsening shortness of breath I would recommend a stress test and perhaps pulmonology consultation for pulmonary function test however I believe most of this is deconditioning

## 2020-06-27 ENCOUNTER — Other Ambulatory Visit: Payer: Self-pay | Admitting: Family Medicine

## 2020-06-27 ENCOUNTER — Other Ambulatory Visit: Payer: Self-pay | Admitting: *Deleted

## 2020-06-27 DIAGNOSIS — Z79899 Other long term (current) drug therapy: Secondary | ICD-10-CM

## 2020-06-28 ENCOUNTER — Other Ambulatory Visit: Payer: Self-pay

## 2020-06-28 LAB — CBC WITH DIFFERENTIAL/PLATELET
Absolute Monocytes: 522 cells/uL (ref 200–950)
Basophils Absolute: 58 cells/uL (ref 0–200)
Basophils Relative: 1 %
Eosinophils Absolute: 70 cells/uL (ref 15–500)
Eosinophils Relative: 1.2 %
HCT: 38.5 % (ref 35.0–45.0)
Hemoglobin: 12.9 g/dL (ref 11.7–15.5)
Lymphs Abs: 1659 cells/uL (ref 850–3900)
MCH: 31.2 pg (ref 27.0–33.0)
MCHC: 33.5 g/dL (ref 32.0–36.0)
MCV: 93.2 fL (ref 80.0–100.0)
MPV: 10.5 fL (ref 7.5–12.5)
Monocytes Relative: 9 %
Neutro Abs: 3492 cells/uL (ref 1500–7800)
Neutrophils Relative %: 60.2 %
Platelets: 280 10*3/uL (ref 140–400)
RBC: 4.13 10*6/uL (ref 3.80–5.10)
RDW: 13.6 % (ref 11.0–15.0)
Total Lymphocyte: 28.6 %
WBC: 5.8 10*3/uL (ref 3.8–10.8)

## 2020-06-28 LAB — COMPLETE METABOLIC PANEL WITH GFR
AG Ratio: 1.7 (calc) (ref 1.0–2.5)
ALT: 7 U/L (ref 6–29)
AST: 11 U/L (ref 10–35)
Albumin: 4 g/dL (ref 3.6–5.1)
Alkaline phosphatase (APISO): 139 U/L (ref 37–153)
BUN: 9 mg/dL (ref 7–25)
CO2: 27 mmol/L (ref 20–32)
Calcium: 10.2 mg/dL (ref 8.6–10.4)
Chloride: 97 mmol/L — ABNORMAL LOW (ref 98–110)
Creat: 0.76 mg/dL (ref 0.60–0.93)
GFR, Est African American: 92 mL/min/{1.73_m2} (ref >=60)
GFR, Est Non African American: 79 mL/min/{1.73_m2} (ref >=60)
Globulin: 2.3 g/dL (calc) (ref 1.9–3.7)
Glucose, Bld: 188 mg/dL — ABNORMAL HIGH (ref 65–99)
Potassium: 3.7 mmol/L (ref 3.5–5.3)
Sodium: 135 mmol/L (ref 135–146)
Total Bilirubin: 0.5 mg/dL (ref 0.2–1.2)
Total Protein: 6.3 g/dL (ref 6.1–8.1)

## 2020-06-28 MED ORDER — OXYCODONE-ACETAMINOPHEN 5-325 MG PO TABS: 1.0000 | ORAL_TABLET | Freq: Every day | ORAL | 0 refills | Status: DC

## 2020-06-28 NOTE — Progress Notes (Signed)
Glucose elevated-188. Chloride borderline low but improving. Rest of CMP WNL.  CBC WNL. No change in therapy at this time.

## 2020-06-28 NOTE — Telephone Encounter (Signed)
Pt asking for refills on controlled.   Last seen 06/25/20 Last filled 04/15/20

## 2020-07-23 ENCOUNTER — Telehealth: Payer: Self-pay | Admitting: *Deleted

## 2020-07-23 ENCOUNTER — Other Ambulatory Visit: Payer: Self-pay | Admitting: Family Medicine

## 2020-07-23 DIAGNOSIS — H04123 Dry eye syndrome of bilateral lacrimal glands: Secondary | ICD-10-CM | POA: Diagnosis not present

## 2020-07-23 DIAGNOSIS — H35373 Puckering of macula, bilateral: Secondary | ICD-10-CM | POA: Diagnosis not present

## 2020-07-23 DIAGNOSIS — H524 Presbyopia: Secondary | ICD-10-CM | POA: Diagnosis not present

## 2020-07-23 DIAGNOSIS — Z961 Presence of intraocular lens: Secondary | ICD-10-CM | POA: Diagnosis not present

## 2020-07-23 DIAGNOSIS — H52223 Regular astigmatism, bilateral: Secondary | ICD-10-CM | POA: Diagnosis not present

## 2020-07-23 NOTE — Telephone Encounter (Signed)
Received request from pharmacy for PA on Promethazine.   PA submitted.   Dx: R11.0- [id number]  Your information has been submitted to [place] [employer] Part D. [employer] will review the request and will issue a decision, typically within 1-3 days from your submission. You can check the updated outcome later by reopening this request.  If [employer] has not responded in 1-3 days or if you have any questions about your ePA request, please contact [place] Medicare Part D at [phone]. If you think there may be a problem with your PA request, use our live chat feature at the bottom right.

## 2020-07-25 DIAGNOSIS — R143 Flatulence: Secondary | ICD-10-CM | POA: Diagnosis not present

## 2020-07-25 DIAGNOSIS — R110 Nausea: Secondary | ICD-10-CM | POA: Diagnosis not present

## 2020-07-25 DIAGNOSIS — R140 Abdominal distension (gaseous): Secondary | ICD-10-CM | POA: Diagnosis not present

## 2020-07-25 NOTE — Telephone Encounter (Signed)
Received PA determination.   PA denied.  

## 2020-08-13 DIAGNOSIS — F329 Major depressive disorder, single episode, unspecified: Secondary | ICD-10-CM | POA: Diagnosis not present

## 2020-08-21 ENCOUNTER — Other Ambulatory Visit: Payer: Self-pay | Admitting: *Deleted

## 2020-08-21 NOTE — Telephone Encounter (Signed)
Received call from patient.   Requested refill on Oxycodone/APAP.  Ok to refill??  Last office visit 06/25/2020.  Last refill 06/28/2020.

## 2020-08-22 MED ORDER — OXYCODONE-ACETAMINOPHEN 5-325 MG PO TABS: 1.0000 | ORAL_TABLET | Freq: Every day | ORAL | 0 refills | Status: DC

## 2020-08-23 ENCOUNTER — Other Ambulatory Visit: Payer: Self-pay

## 2020-08-23 ENCOUNTER — Ambulatory Visit (INDEPENDENT_AMBULATORY_CARE_PROVIDER_SITE_OTHER): Payer: Medicare Other | Admitting: Otolaryngology

## 2020-08-23 VITALS — Temp 97.2°F

## 2020-08-23 DIAGNOSIS — J310 Chronic rhinitis: Secondary | ICD-10-CM

## 2020-08-23 DIAGNOSIS — J3489 Other specified disorders of nose and nasal sinuses: Secondary | ICD-10-CM

## 2020-08-23 NOTE — Progress Notes (Signed)
HPI: Janet Peterson is a 69 y.o. female who returns today for evaluation of complaints of chronic postnasal drainage with thick mucus in the mornings.  The mucus is generally clear.  She also complains of some puffiness in her face underneath her eyes.  She has had previous septoplasty and turbinate reductions with Dr. Harle Battiest over 20 years ago.  She has had no colored discharge from her nose.  She has been using Flonase. Of note she has also had previous history of a skin cancer removed from the left side of her nose.Marland Kitchen  Past Medical History:  Diagnosis Date  . Ankylosing spondylitis ([place])   . Anxiety and depression   . Depression   . Fibromyalgia   . GERD (gastroesophageal reflux disease)   . Hiatal hernia    per patient, dx by GI   . History of basal cell carcinoma excision    FACE, [place]  . History of hiatal hernia   . History of thrush   . Hyperlipidemia   . Hypertension   . Insomnia   . Left breast mass   . OCD (obsessive compulsive disorder)   . OSA (obstructive sleep apnea)    MILD AND NO CPAP SINCE SURGERY IN 2007  . Osteopenia   . PONV (postoperative nausea and vomiting)   . Psoriatic arthritis ([place])   . PVC (premature ventricular contraction)   . Rheumatoid arthritis [facility])    Past Surgical History:  Procedure Laterality Date  . BIOPSY  09/29/2019   Procedure: BIOPSY;  Surgeon: Ronnette Juniper, MD;  [facility];  Service: Gastroenterology;;  . BREAST LUMPECTOMY WITH RADIOACTIVE SEED LOCALIZATION Left 05/04/2018   Procedure: LEFT BREAST LUMPECTOMY WITH RADIOACTIVE SEED LOCALIZATION AND LEFT BREAST NIPPLE BIOPSY;  Surgeon: Jovita Kussmaul, MD;  Location: [place];  Service: General;  Laterality: Left;  . BREAST SURGERY  1975   lumpectomy-- benign  . BUNIONECTOMY  1991  . CATARACT EXTRACTION W/ INTRAOCULAR LENS  IMPLANT, BILATERAL  2000  . CERVICAL FUSION  March 2013   C5 -- C7  . CESAREAN SECTION  1985  . COLONOSCOPY WITH PROPOFOL N/A  09/29/2019   Procedure: COLONOSCOPY WITH PROPOFOL;  Surgeon: Ronnette Juniper, MD;  [facility];  Service: Gastroenterology;  Laterality: N/A;  . DISTAL INTERPHALANGEAL JOINT FUSION Right 03/14/2015   Procedure: RIGHT LONG FINGER DISTAL INTERPHALANGEAL JOINT ARTHRODESIS;  Surgeon: Iran Planas, MD;  Location: [place];  Service: Orthopedics;  Laterality: Right;  . ESOPHAGOGASTRODUODENOSCOPY (EGD) WITH PROPOFOL N/A 09/29/2019   Procedure: ESOPHAGOGASTRODUODENOSCOPY (EGD) WITH PROPOFOL;  Surgeon: Ronnette Juniper, MD;  [facility];  Service: Gastroenterology;  Laterality: N/A;  . EYE SURGERY  1995   rk (laser surgery), semi cornea transplant, detacted retina,  fluid removal  . KNEE ARTHROSCOPY Left 03-03-2004  . POLYPECTOMY  09/29/2019   Procedure: POLYPECTOMY;  Surgeon: Ronnette Juniper, MD;  [facility];  Service: Gastroenterology;;  . POSTERIOR VITRECTOMY RIGHT EYE AND LASER   10-27-1999  . SHOULDER SURGERY Right 1997  . SPINAL FIXATION SURGERY W/ IMPLANT  2013 rod #1//  2014  rod 2   S1 -- T10  (rod #1)//   S1 -- T4 (rod #2)  . THORACIC FUSION  03-13-2013   REMOVAL HARDWARE/  BONE GRAFT FUSION T10//  REVISION OF RODS  . TOTAL KNEE ARTHROPLASTY Left 12-14-2005  . UVULOPALATOPHARYNGOPLASTY  04-26-2006   w/  TONSILLECTOMY/  TURBINATE REDUCTIONS/  BILATERAL ANTERIOR ETHMOIDECTOMY   Social History  Socioeconomic History  . Marital status: Married    Spouse name: Not on file  . Number of children: Not on file  . Years of education: Not on file  . Highest education level: Not on file  Occupational History  . Not on file  Tobacco Use  . Smoking status: Former Smoker    Packs/day: 0.50    Years: 10.00    Pack years: 5.00    Types: Cigarettes    Quit date: 03/07/1995    Years since quitting: 25.4  . Smokeless tobacco: Never Used  Vaping Use  . Vaping Use: Never used  Substance and Sexual Activity  . Alcohol use: Yes    Comment: rare  . Drug use:  Never  . Sexual activity: Not on file  Other Topics Concern  . Not on file  Social History Narrative  . Not on file   Social Determinants of Health   Financial Resource Strain: Not on file  Food Insecurity: Not on file  Transportation Needs: Not on file  Physical Activity: Not on file  Stress: Not on file  Social Connections: Not on file   Family History  Problem Relation Age of Onset  . Diabetes Mother   . Heart disease Mother   . Diabetes Father   . Anuerysm Father   . Diabetes Brother   . Heart disease Brother   . Diabetes Brother   . Heart disease Brother    Allergies  Allergen Reactions  . Latex Rash    And Mouth sores  . Amlodipine Besy-Benazepril Hcl Other (See Comments)    COUGH  . Augmentin [Amoxicillin-Pot Clavulanate] Diarrhea and Nausea And Vomiting  . Bextra [Valdecoxib] Diarrhea and Nausea And Vomiting    REFLUX  . Chlorhexidine     rash  . Lipitor [Atorvastatin Calcium] Other (See Comments)    MYALGIAS  . Sulfa Antibiotics Nausea And Vomiting    CRAMPING  . Zocor [Simvastatin] Other (See Comments)    MYALGIA   Prior to Admission medications   Medication Sig Start Date End Date Taking? Authorizing Provider  amLODipine (NORVASC) 10 MG tablet Take 1 tablet (10 mg total) by mouth daily. 05/09/20   Susy Frizzle, MD  buPROPion (WELLBUTRIN XL) 300 MG 24 hr tablet Take 300 mg by mouth every morning. 04/07/17   [provider]  Capsaicin-Menthol (SALONPAS GEL EX) Apply 1 application topically daily as needed (pain).    [provider]  clonazePAM (KLONOPIN) 1 MG tablet TAKE 1 TABLET BY MOUTH EVERY DAY AS NEEDED FOR ANXIETY Patient taking differently: Take 1 mg by mouth daily as needed for anxiety. 07/07/19   Susy Frizzle, MD  ezetimibe (ZETIA) 10 MG tablet TAKE 1 TABLET BY MOUTH EVERY DAY 07/25/20   Susy Frizzle, MD  fluticasone [facility]) 50 MCG/ACT nasal spray Place 2 sprays into both nostrils daily as needed for allergies.      [provider]  fluvoxaMINE (LUVOX) 100 MG tablet Take 125 mg by mouth in the morning, at noon, and at bedtime.  07/19/19   [provider]  leflunomide (ARAVA) 20 MG tablet Take 1 tablet (20 mg total) by mouth daily. 04/25/20   Ofilia Neas, PA-C  Menthol-Methyl Salicylate (SALONPAS PAIN RELIEF PATCH EX) Apply 1 application topically daily as needed (pain).    [provider]  metoCLOPramide (REGLAN) 10 MG tablet Take 1 tablet (10 mg total) by mouth 4 (four) times daily -  before meals and at bedtime. Patient not  taking: Reported on 06/25/2020 05/23/20   Susy Frizzle, MD  metoprolol succinate (TOPROL-XL) 50 MG 24 hr tablet TAKE 1 TABLET BY MOUTH ONCE DAILY FOLLOWING A MEAL Patient taking differently: Take 50 mg by mouth daily. 11/22/19   Susy Frizzle, MD  naproxen sodium (ALEVE) 220 MG tablet Take 440 mg by mouth daily as needed (headache).    [provider]  omeprazole (PRILOSEC) 40 MG capsule TAKE 1 CAPSULE BY MOUTH EVERY MORNING AND AT BEDTIME 07/25/20   Susy Frizzle, MD  ondansetron (ZOFRAN) 4 MG tablet Take 4 mg by mouth 4 (four) times daily as needed for nausea.  Patient not taking: Reported on 06/25/2020 04/25/20   [provider]  oxyCODONE-acetaminophen (PERCOCET) 5-325 MG tablet Take 1 tablet by mouth at bedtime. 08/22/20   Susy Frizzle, MD  Probiotic Product (PROBIOTIC PO) Take 1 tablet by mouth daily after breakfast.    [provider]  prochlorperazine (COMPAZINE) 10 MG tablet Take 1 tablet (10 mg total) by mouth every 6 (six) hours as needed for nausea or vomiting. Patient not taking: Reported on 06/25/2020 05/02/20   Susy Frizzle, MD  promethazine (PHENERGAN) 25 MG tablet TAKE 1 TABLET (25 MG TOTAL) BY MOUTH 2 (TWO) TIMES DAILY AS NEEDED FOR NAUSEA OR VOMITING. 06/28/20   Susy Frizzle, MD  QUEtiapine (SEROQUEL) 25 MG tablet Take 25 mg by mouth at bedtime.  04/25/20   [provider]  tiZANidine  (ZANAFLEX) 4 MG tablet TAKE 1 TABLET (4 MG TOTAL) BY MOUTH EVERY 6 (SIX) HOURS AS NEEDED FOR MUSCLE SPASMS. 04/23/20   Susy Frizzle, MD  valsartan (DIOVAN) 320 MG tablet TAKE 1 TABLET BY MOUTH EVERY DAY Patient not taking: Reported on 06/25/2020 04/23/20   Susy Frizzle, MD  Vitamin D, Ergocalciferol, (DRISDOL) 1.25 MG (50000 UNIT) CAPS capsule TAKE 1 CAPSULE BY MOUTH EVERY 7 DAYS 05/27/20   Susy Frizzle, MD     Positive ROS: Otherwise negative  All other systems have been reviewed and were otherwise negative with the exception of those mentioned in the HPI and as above.  Physical Exam: Constitutional: Alert, well-appearing, no acute distress Ears: External ears without lesions or tenderness. Ear canals are clear bilaterally with intact, clear TMs.  Nasal: External nose without lesions. Septum relatively midline.  Nasal passages are clear bilaterally with no signs of infection no polyps.  Both middle meatus regions are clear.  Of note she does have a tendency for collapse of the left nasal valve..  Oral: Lips and gums without lesions. Tongue and palate mucosa without lesions. Posterior oropharynx clear.  No significant drainage noted posterior oropharynx. Neck: No palpable adenopathy or masses. On facial exam she has no erythema or significant swelling of the face but she is slightly puffy beneath her eyes consistent with allergies. Respiratory: Breathing comfortably  Skin: No facial/neck lesions or rash noted.  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  Plan: Discussed with patient concerning regular use of the Flonase 2 sprays each nostril at night as well as use of saline irrigation especially at night before she goes to bed as this will help reduce some of the mucus production.  She can also use Mucinex if the mucus is really thickened but recommend drinking plenty of liquids. For the trouble breathing through the left  side of her nose and night when she lies down suggested trying air max which is a intranasal stent  that will provide support to the left nasal valve region and improve her breathing.   Radene Journey, MD

## 2020-09-03 ENCOUNTER — Encounter: Payer: Self-pay | Admitting: Diagnostic Neuroimaging

## 2020-09-03 ENCOUNTER — Ambulatory Visit (INDEPENDENT_AMBULATORY_CARE_PROVIDER_SITE_OTHER): Payer: Medicare Other | Admitting: Diagnostic Neuroimaging

## 2020-09-03 VITALS — BP 146/98 | HR 86 | Ht 64.0 in | Wt 184.0 lb

## 2020-09-03 DIAGNOSIS — G2401 Drug induced subacute dyskinesia: Secondary | ICD-10-CM | POA: Diagnosis not present

## 2020-09-03 NOTE — Progress Notes (Signed)
[facility]  PATIENT: Janet Peterson DOB: 03/26/1951  REFERRING CLINICIAN: Susy Frizzle, MD HISTORY FROM: patient  REASON FOR VISIT: new consult    HISTORICAL  CHIEF COMPLAINT:  Chief Complaint  Patient presents with  . Tardive dyskinesia    Rm 6 New Pt "off and on clonazepam for many years for OCD"    HISTORY OF PRESENT ILLNESS:   70 year old female here for evaluation tardive dyskinesia.  September 2021 patient had onset of stomach pain nausea and vomiting.  She was diagnosed with constipation and treated.  Patient had persistent nausea and vomiting and then was prescribed Reglan, Phenergan and Compazine without relief.  Around December 2021 she started to have abnormal involuntary movements of her face, tongue, lips, toes and feet.  Reglan Phenergan and Compazine were stopped.  Abnormal movements continued.  February 2022 patient was started on Seroquel for insomnia.  Abnormal movements continued.  Patient has history of anxiety, depression, OCD, PTSD.  Her son died from overdose a few years ago, but she has been having difficult time compensating and coping with this recently.  She had behavioral health hospitalization recently for suicidal ideations.   REVIEW OF SYSTEMS: Full 14 system review of systems performed and negative with exception of: As per HPI.  ALLERGIES: Allergies  Allergen Reactions  . Latex Rash    And Mouth sores Other reaction(s): rash  . Amlodipine Besy-Benazepril Hcl Other (See Comments)    COUGH  . Augmentin [Amoxicillin-Pot Clavulanate] Diarrhea and Nausea And Vomiting  . Bextra [Valdecoxib] Diarrhea and Nausea And Vomiting    REFLUX  . Bupropion     Other reaction(s): rash, hives, nausea, high BP , sores,  . Chlorhexidine     rash  . Lipitor [Atorvastatin Calcium] Other (See Comments)    MYALGIAS  . Sulfa Antibiotics Nausea And Vomiting    CRAMPING  . Zocor [Simvastatin] Other (See Comments)    MYALGIA    HOME  MEDICATIONS: Outpatient Medications Prior to Visit  Medication Sig Dispense Refill  . amLODipine (NORVASC) 10 MG tablet Take 1 tablet (10 mg total) by mouth daily. 90 tablet 3  . buPROPion (WELLBUTRIN XL) 300 MG 24 hr tablet Take 300 mg by mouth every morning.  1  . clonazePAM (KLONOPIN) 1 MG tablet TAKE 1 TABLET BY MOUTH EVERY DAY AS NEEDED FOR ANXIETY (Patient taking differently: Take 1 mg by mouth daily as needed for anxiety.) 30 tablet 1  . ezetimibe (ZETIA) 10 MG tablet TAKE 1 TABLET BY MOUTH EVERY DAY 90 tablet 3  . fluticasone (FLONASE) 50 MCG/ACT nasal spray Place 2 sprays into both nostrils daily as needed for allergies.     Marland Kitchen leflunomide (ARAVA) 20 MG tablet Take 1 tablet (20 mg total) by mouth daily. 90 tablet 0  . metoprolol succinate (TOPROL-XL) 50 MG 24 hr tablet TAKE 1 TABLET BY MOUTH ONCE DAILY FOLLOWING A MEAL (Patient taking differently: Take 50 mg by mouth daily.) 90 tablet 3  . naproxen sodium (ALEVE) 220 MG tablet Take 440 mg by mouth daily as needed (headache).    Marland Kitchen omeprazole (PRILOSEC) 40 MG capsule TAKE 1 CAPSULE BY MOUTH EVERY MORNING AND AT BEDTIME 180 capsule 1  . ondansetron (ZOFRAN) 4 MG tablet Take 4 mg by mouth 4 (four) times daily as needed for nausea.    Marland Kitchen oxyCODONE-acetaminophen (PERCOCET) 5-325 MG tablet Take 1 tablet by mouth at bedtime. 60 tablet 0  . Probiotic Product (PROBIOTIC PO) Take 1 tablet by mouth daily  after breakfast.    . prochlorperazine (COMPAZINE) 10 MG tablet Take 1 tablet (10 mg total) by mouth every 6 (six) hours as needed for nausea or vomiting. 30 tablet 0  . promethazine (PHENERGAN) 25 MG tablet TAKE 1 TABLET (25 MG TOTAL) BY MOUTH 2 (TWO) TIMES DAILY AS NEEDED FOR NAUSEA OR VOMITING. 60 tablet 0  . QUEtiapine (SEROQUEL) 25 MG tablet Take 25 mg by mouth at bedtime.     Marland Kitchen tiZANidine (ZANAFLEX) 4 MG tablet TAKE 1 TABLET (4 MG TOTAL) BY MOUTH EVERY 6 (SIX) HOURS AS NEEDED FOR MUSCLE SPASMS. 30 tablet 0  . valsartan (DIOVAN) 320 MG tablet TAKE  1 TABLET BY MOUTH EVERY DAY 90 tablet 3  . Vitamin D, Ergocalciferol, (DRISDOL) 1.25 MG (50000 UNIT) CAPS capsule TAKE 1 CAPSULE BY MOUTH EVERY 7 DAYS 5 capsule 5  . Capsaicin-Menthol (SALONPAS GEL EX) Apply 1 application topically daily as needed (pain). (Patient not taking: Reported on 09/03/2020)    . fluvoxaMINE (LUVOX) 100 MG tablet Take 125 mg by mouth in the morning, at noon, and at bedtime.  (Patient not taking: Reported on 09/03/2020)    . Menthol-Methyl Salicylate (SALONPAS PAIN RELIEF PATCH EX) Apply 1 application topically daily as needed (pain). (Patient not taking: Reported on 09/03/2020)    . metoCLOPramide (REGLAN) 10 MG tablet Take 1 tablet (10 mg total) by mouth 4 (four) times daily -  before meals and at bedtime. (Patient not taking: No sig reported) 120 tablet 2   No facility-administered medications prior to visit.    PAST MEDICAL HISTORY: Past Medical History:  Diagnosis Date  . Ankylosing spondylitis ([place])   . Anxiety and depression   . Depression   . Fibromyalgia   . GERD (gastroesophageal reflux disease)   . Hiatal hernia    per patient, dx by GI   . History of basal cell carcinoma excision    FACE, [place]  . History of hiatal hernia   . History of thrush   . Hyperlipidemia   . Hypertension   . Insomnia   . Left breast mass   . OCD (obsessive compulsive disorder)   . OSA (obstructive sleep apnea)    MILD AND NO CPAP SINCE SURGERY IN 2007  . Osteopenia   . PONV (postoperative nausea and vomiting)   . Psoriatic arthritis ([place])   . PVC (premature ventricular contraction)   . Rheumatoid arthritis ([place])     PAST SURGICAL HISTORY: Past Surgical History:  Procedure Laterality Date  . BIOPSY  09/29/2019   Procedure: BIOPSY;  Surgeon: Ronnette Juniper, MD;  [facility];  Service: Gastroenterology;;  . BREAST LUMPECTOMY WITH RADIOACTIVE SEED LOCALIZATION Left 05/04/2018   Procedure: LEFT BREAST LUMPECTOMY WITH RADIOACTIVE SEED LOCALIZATION AND LEFT  BREAST NIPPLE BIOPSY;  Surgeon: Jovita Kussmaul, MD;  Location: [place];  Service: General;  Laterality: Left;  . BREAST SURGERY  1975   lumpectomy-- benign  . BUNIONECTOMY  1991  . CATARACT EXTRACTION W/ INTRAOCULAR LENS  IMPLANT, BILATERAL  2000  . CERVICAL FUSION  March 2013   C5 -- C7  . CESAREAN SECTION  1985  . COLONOSCOPY WITH PROPOFOL N/A 09/29/2019   Procedure: COLONOSCOPY WITH PROPOFOL;  Surgeon: Ronnette Juniper, MD;  [facility];  Service: Gastroenterology;  Laterality: N/A;  . DISTAL INTERPHALANGEAL JOINT FUSION Right 03/14/2015   Procedure: RIGHT LONG FINGER DISTAL INTERPHALANGEAL JOINT ARTHRODESIS;  Surgeon: Iran Planas, MD;  Location: [place];  Service: Orthopedics;  Laterality: Right;  . ESOPHAGOGASTRODUODENOSCOPY (EGD) WITH PROPOFOL N/A 09/29/2019   Procedure: ESOPHAGOGASTRODUODENOSCOPY (EGD) WITH PROPOFOL;  Surgeon: Ronnette Juniper, MD;  [facility];  Service: Gastroenterology;  Laterality: N/A;  . EYE SURGERY  1995   rk (laser surgery), semi cornea transplant, detacted retina,  fluid removal  . KNEE ARTHROSCOPY Left 03-03-2004  . POLYPECTOMY  09/29/2019   Procedure: POLYPECTOMY;  Surgeon: Ronnette Juniper, MD;  [facility];  Service: Gastroenterology;;  . POSTERIOR VITRECTOMY RIGHT EYE AND LASER   10-27-1999  . SHOULDER SURGERY Right 1997  . SPINAL FIXATION SURGERY W/ IMPLANT  2013 rod #1//  2014  rod 2   S1 -- T10  (rod #1)//   S1 -- T4 (rod #2)  . THORACIC FUSION  03-13-2013   REMOVAL HARDWARE/  BONE GRAFT FUSION T10//  REVISION OF RODS  . TOTAL KNEE ARTHROPLASTY Left 12-14-2005  . UVULOPALATOPHARYNGOPLASTY  04-26-2006   w/  TONSILLECTOMY/  TURBINATE REDUCTIONS/  BILATERAL ANTERIOR ETHMOIDECTOMY    FAMILY HISTORY: Family History  Problem Relation Age of Onset  . Diabetes Mother   . Heart disease Mother   . Diabetes Father   . Anuerysm Father   . Diabetes Brother   . Heart disease Brother   . Diabetes  Brother   . Heart disease Brother     SOCIAL HISTORY: Social History   Socioeconomic History  . Marital status: Married    Spouse name: Daryl  . Number of children: Not on file  . Years of education: Not on file  . Highest education level: Not on file  Occupational History    Comment: retired  Tobacco Use  . Smoking status: Former Smoker    Packs/day: 0.50    Years: 10.00    Pack years: 5.00    Types: Cigarettes    Quit date: 03/07/1995    Years since quitting: 25.5  . Smokeless tobacco: Never Used  Vaping Use  . Vaping Use: Never used  Substance and Sexual Activity  . Alcohol use: Yes    Comment: rare  . Drug use: Never  . Sexual activity: Not on file  Other Topics Concern  . Not on file  Social History Narrative   Lives with husband   Social Determinants of Health   Financial Resource Strain: Not on file  Food Insecurity: Not on file  Transportation Needs: Not on file  Physical Activity: Not on file  Stress: Not on file  Social Connections: Not on file  Intimate Partner Violence: Not on file     PHYSICAL EXAM  GENERAL EXAM/CONSTITUTIONAL: Vitals:  Vitals:   09/03/20 1435  BP: (!) 146/98  Pulse: 86  Weight: 184 lb (83.5 kg)  Height: 5\' 4"  (1.626 m)   Body mass index is 31.58 kg/m. Wt Readings from Last 3 Encounters:  09/03/20 184 lb (83.5 kg)  06/25/20 192 lb (87.1 kg)  05/23/20 190 lb (86.2 kg)    Patient is in no distress; well developed, nourished and groomed; neck is supple  CARDIOVASCULAR:  Examination of carotid arteries is normal; no carotid bruits  Regular rate and rhythm, no murmurs  Examination of peripheral vascular system by observation and palpation is normal  EYES:  Ophthalmoscopic exam of optic discs and posterior segments is normal; no papilledema or hemorrhages No exam data present  MUSCULOSKELETAL:  Gait, strength, tone, movements noted in Neurologic exam below  NEUROLOGIC: MENTAL STATUS:  No flowsheet data  found.  awake, alert, oriented to person, place  and time  recent and remote memory intact  normal attention and concentration  language fluent, comprehension intact, naming intact  fund of knowledge appropriate  CRANIAL NERVE:   2nd - no papilledema on fundoscopic exam  2nd, 3rd, 4th, 6th - pupils equal and reactive to light, visual fields full to confrontation, extraocular muscles intact, no nystagmus  5th - facial sensation symmetric  7th - facial strength symmetric  8th - hearing intact  9th - palate elevates symmetrically, uvula midline  11th - shoulder shrug symmetric  12th - tongue protrusion midline  INTERMITTENT OROLINGUAL DYSKINESIA  MOTOR:   normal bulk and tone, full strength in the BUE, BLE  AKATHISIA IN LEGS > ARMS  SENSORY:   normal and symmetric to light touch, temperature, vibration  COORDINATION:   finger-nose-finger, fine finger movements normal  REFLEXES:   deep tendon reflexes present and symmetric  GAIT/STATION:   narrow based gait     DIAGNOSTIC DATA (LABS, IMAGING, TESTING) - I reviewed patient records, labs, notes, testing and imaging myself where available.  Lab Results  Component Value Date   WBC 5.8 06/27/2020   HGB 12.9 06/27/2020   HCT 38.5 06/27/2020   MCV 93.2 06/27/2020   PLT 280 06/27/2020      Component Value Date/Time   NA 135 06/27/2020 1447   K 3.7 06/27/2020 1447   CL 97 (L) 06/27/2020 1447   CO2 27 06/27/2020 1447   GLUCOSE 188 (H) 06/27/2020 1447   BUN 9 06/27/2020 1447   CREATININE 0.76 06/27/2020 1447   CALCIUM 10.2 06/27/2020 1447   PROT 6.3 06/27/2020 1447   ALBUMIN 3.5 04/25/2020 2203   AST 11 06/27/2020 1447   ALT 7 06/27/2020 1447   ALKPHOS 118 04/25/2020 2203   BILITOT 0.5 06/27/2020 1447   GFRNONAA 79 06/27/2020 1447   GFRAA 92 06/27/2020 1447   Lab Results  Component Value Date   CHOL 182 10/15/2016   HDL 54 10/15/2016   LDLCALC 104 (H) 10/15/2016   TRIG 121 10/15/2016    CHOLHDL 3.4 10/15/2016   Lab Results  Component Value Date   HGBA1C 6.1 (H) 02/19/2020   Lab Results  Component Value Date   VITAMINB12 417 06/05/2019   Lab Results  Component Value Date   TSH 1.55 06/05/2019      ASSESSMENT AND PLAN  70 y.o. year old female here with:  Dx:  1. Tardive dyskinesia      PLAN:  TARDIVE DYSKINESIA (likely related to compazine, reglan for nausea in Dec 2021; then started on seroquel in Feb 2022 for insomnia) - consider to wean off seroquel (needs to discuss with psychiatry given her history) - may consider valbenazine 40mg  daily in future   Return for pending if symptoms worsen or fail to improve.    Penni Bombard, MD 6/54/6503, 5:46 PM Certified in Neurology, Neurophysiology and Neuroimaging  [facility] [address], [place], [place] [address] [phone]

## 2020-09-13 NOTE — Progress Notes (Signed)
Office Visit Note  Patient: Janet Peterson             Date of Birth: 1951-05-14           MRN: [id number]             PCP: Susy Frizzle, MD Referring: Susy Frizzle, MD Visit Date: 09/26/2020 Occupation: @GUAROCC @  Subjective:  Medication Management   History of Present Illness: Janet Peterson is a 70 y.o. female of psoriatic arthritis and psoriasis.  She states that her symptoms are well controlled on leflunomide.  She denies any joint pain or joint swelling.  She has had occasional mild psoriasis patches but nothing active currently.  She continues to have thoracic pain despite taking pain medications.  She states she was referred to pain management by Dr. Dennard Schaumann.  Her replaced left knee joint bothers her off and on.   Activities of Daily Living:  Patient reports morning stiffness for 1 hour.   Patient Reports nocturnal pain.  Difficulty dressing/grooming: Denies Difficulty climbing stairs: Reports Difficulty getting out of chair: Denies Difficulty using hands for taps, buttons, cutlery, and/or writing: Reports  Review of Systems  Constitutional: Positive for fatigue. Negative for night sweats, weight gain and weight loss.  HENT: Negative for mouth sores, trouble swallowing, trouble swallowing, mouth dryness and nose dryness.   Eyes: Negative for pain, redness, itching, visual disturbance and dryness.  Respiratory: Positive for shortness of breath. Negative for cough and difficulty breathing.   Cardiovascular: Negative for chest pain, palpitations, hypertension, irregular heartbeat and swelling in legs/feet.  Gastrointestinal: Positive for constipation. Negative for blood in stool and diarrhea.  Endocrine: Negative for increased urination.  Genitourinary: Negative for difficulty urinating and vaginal dryness.  Musculoskeletal: Positive for arthralgias, joint pain, myalgias, morning stiffness, muscle tenderness and myalgias. Negative for joint swelling and muscle weakness.   Skin: Negative for color change, rash, hair loss, redness, skin tightness, ulcers and sensitivity to sunlight.  Allergic/Immunologic: Negative for susceptible to infections.  Neurological: Positive for weakness. Negative for dizziness, numbness, headaches, memory loss and night sweats.  Hematological: Positive for bruising/bleeding tendency. Negative for swollen glands.  Psychiatric/Behavioral: Negative for depressed mood, confusion and sleep disturbance. The patient is not nervous/anxious.     PMFS History:  Patient Active Problem List   Diagnosis Date Noted  . C. difficile colitis 09/29/2019  . Colitis 09/28/2019  . Anxiety and depression 09/28/2019  . DDD (degenerative disc disease), cervical s/p fusion 11/12/2016  . H/O total knee replacement, left 05/06/2016  . DDD lumbar spine status post fusion 05/06/2016  . Psoriasis 05/05/2016  . High risk medication use 05/05/2016  . Cervical post-laminectomy syndrome 12/09/2015  . Thoracic postlaminectomy syndrome 12/09/2015  . Psoriatic arthritis ([place]) 08/23/2012  . Osteopenia 08/23/2012  . HLD (hyperlipidemia) 08/23/2012  . RLS (restless legs syndrome) 08/23/2012  . Insomnia 08/23/2012  . Fibromyalgia syndrome 08/12/2012  . Low back pain 08/12/2012  . Syncope   . PVC (premature ventricular contraction)   . OCD (obsessive compulsive disorder)   . GERD (gastroesophageal reflux disease)   . Hypertension     Past Medical History:  Diagnosis Date  . Ankylosing spondylitis ([place])   . Anxiety and depression   . Depression   . Fibromyalgia   . GERD (gastroesophageal reflux disease)   . Hiatal hernia    per patient, dx by GI   . History of basal cell carcinoma excision    FACE, [place]  .  History of hiatal hernia   . History of thrush   . Hyperlipidemia   . Hypertension   . Insomnia   . Left breast mass   . OCD (obsessive compulsive disorder)   . OSA (obstructive sleep apnea)    MILD AND NO CPAP SINCE SURGERY IN 2007  .  Osteopenia   . PONV (postoperative nausea and vomiting)   . Psoriatic arthritis ([place])   . PVC (premature ventricular contraction)   . Rheumatoid arthritis ([place])     Family History  Problem Relation Age of Onset  . Diabetes Mother   . Heart disease Mother   . Diabetes Father   . Anuerysm Father   . Diabetes Brother   . Heart disease Brother   . Diabetes Brother   . Heart disease Brother    Past Surgical History:  Procedure Laterality Date  . BIOPSY  09/29/2019   Procedure: BIOPSY;  Surgeon: Ronnette Juniper, MD;  Location: [facility];  Service: Gastroenterology;;  . BREAST LUMPECTOMY WITH RADIOACTIVE SEED LOCALIZATION Left 05/04/2018   Procedure: LEFT BREAST LUMPECTOMY WITH RADIOACTIVE SEED LOCALIZATION AND LEFT BREAST NIPPLE BIOPSY;  Surgeon: Jovita Kussmaul, MD;  Location: [facility];  Service: General;  Laterality: Left;  . BREAST SURGERY  1975   lumpectomy-- benign  . BUNIONECTOMY  1991  . CATARACT EXTRACTION W/ INTRAOCULAR LENS  IMPLANT, BILATERAL  2000  . CERVICAL FUSION  March 2013   C5 -- C7  . CESAREAN SECTION  1985  . COLONOSCOPY WITH PROPOFOL N/A 09/29/2019   Procedure: COLONOSCOPY WITH PROPOFOL;  Surgeon: Ronnette Juniper, MD;  Location: [facility];  Service: Gastroenterology;  Laterality: N/A;  . DISTAL INTERPHALANGEAL JOINT FUSION Right 03/14/2015   Procedure: RIGHT LONG FINGER DISTAL INTERPHALANGEAL JOINT ARTHRODESIS;  Surgeon: Iran Planas, MD;  Location: [facility];  Service: Orthopedics;  Laterality: Right;  . ESOPHAGOGASTRODUODENOSCOPY (EGD) WITH PROPOFOL N/A 09/29/2019   Procedure: ESOPHAGOGASTRODUODENOSCOPY (EGD) WITH PROPOFOL;  Surgeon: Ronnette Juniper, MD;  Location: [facility];  Service: Gastroenterology;  Laterality: N/A;  . EYE SURGERY  1995   rk (laser surgery), semi cornea transplant, detacted retina,  fluid removal  . KNEE ARTHROSCOPY Left 03-03-2004  . POLYPECTOMY  09/29/2019   Procedure: POLYPECTOMY;  Surgeon: Ronnette Juniper, MD;   Location: [facility];  Service: Gastroenterology;;  . POSTERIOR VITRECTOMY RIGHT EYE AND LASER   10-27-1999  . SHOULDER SURGERY Right 1997  . SPINAL FIXATION SURGERY W/ IMPLANT  2013 rod #1//  2014  rod 2   S1 -- T10  (rod #1)//   S1 -- T4 (rod #2)  . THORACIC FUSION  03-13-2013   REMOVAL HARDWARE/  BONE GRAFT FUSION T10//  REVISION OF RODS  . TOTAL KNEE ARTHROPLASTY Left 12-14-2005  . UVULOPALATOPHARYNGOPLASTY  04-26-2006   w/  TONSILLECTOMY/  TURBINATE REDUCTIONS/  BILATERAL ANTERIOR ETHMOIDECTOMY   Social History   Social History Narrative   Lives with husband   Immunization History  Administered Date(s) Administered  . Fluad Quad(high Dose 65+) 01/26/2019, 03/05/2020  . Influenza Whole 03/04/2012  . Influenza, High Dose Seasonal PF 03/25/2018  . Influenza,inj,Quad PF,6+ Mos 02/28/2013, 03/07/2014, 02/28/2015, 03/27/2016, 03/09/2017  . Pneumococcal Conjugate-13 09/10/2016  . Pneumococcal Polysaccharide-23 03/12/2006, 11/09/2011  . Unspecified SARS-COV-2 Vaccination 08/07/2019, 08/28/2019, 03/08/2020     Objective: Vital Signs: BP (!) 147/100 (BP Location: Left Arm, Patient Position: Sitting, Cuff Size: Normal)   Pulse 94   Ht 5\' 4"  (1.626 m)   Wt 188 lb 12.8 oz (85.6 kg)  BMI 32.41 kg/m    Physical Exam Vitals and nursing note reviewed.  Constitutional:      Appearance: She is well-developed.  HENT:     Head: Normocephalic and atraumatic.  Eyes:     Conjunctiva/sclera: Conjunctivae normal.  Cardiovascular:     Rate and Rhythm: Normal rate and regular rhythm.     Heart sounds: Normal heart sounds.  Pulmonary:     Effort: Pulmonary effort is normal.     Breath sounds: Normal breath sounds.  Abdominal:     General: Bowel sounds are normal.     Palpations: Abdomen is soft.  Musculoskeletal:     Cervical back: Normal range of motion.  Lymphadenopathy:     Cervical: No cervical adenopathy.  Skin:    General: Skin is warm and dry.     Capillary Refill:  Capillary refill takes less than 2 seconds.  Neurological:     Mental Status: She is alert and oriented to person, place, and time.  Psychiatric:        Behavior: Behavior normal.      Musculoskeletal Exam: C-spine had limited range of motion without discomfort.  She has thoracic kyphosis.  She has limited range of motion of her lumbar spine.  Shoulder joints, elbow joints, wrist joints with good range of motion.  She had bilateral DIP and PIP thickening.  No synovitis was noted.  Hip joints with good range of motion.  Her left knee joint is replaced without any warmth swelling effusion.  Right knee joint was in good range of motion.  She had bilateral hammertoes without any synovitis.  CDAI Exam: CDAI Score: -- Patient Global: --; Provider Global: -- Swollen: --; Tender: -- Joint Exam 09/26/2020   No joint exam has been documented for this visit   There is currently no information documented on the homunculus. Go to the Rheumatology activity and complete the homunculus joint exam.  Investigation: No additional findings.  Imaging: No results found.  Recent Labs: Lab Results  Component Value Date   WBC 5.8 06/27/2020   HGB 12.9 06/27/2020   PLT 280 06/27/2020   NA 135 06/27/2020   K 3.7 06/27/2020   CL 97 (L) 06/27/2020   CO2 27 06/27/2020   GLUCOSE 188 (H) 06/27/2020   BUN 9 06/27/2020   CREATININE 0.76 06/27/2020   BILITOT 0.5 06/27/2020   ALKPHOS 118 04/25/2020   AST 11 06/27/2020   ALT 7 06/27/2020   PROT 6.3 06/27/2020   ALBUMIN 3.5 04/25/2020   CALCIUM 10.2 06/27/2020   GFRAA 92 06/27/2020   QFTBGOLD Indeterminate 08/14/2016   QFTBGOLDPLUS Negative 12/22/2018    Speciality Comments: Simponi Aria every 8 weeks started Jan 2018  TB gold negative 08/19/16  Procedures:  No procedures performed Allergies: Latex, Amlodipine besy-benazepril hcl, Augmentin [amoxicillin-pot clavulanate], Bextra [valdecoxib], Chlorhexidine, Lipitor [atorvastatin calcium], Sulfa  antibiotics, and Zocor [simvastatin]   Assessment / Plan:     Visit Diagnoses: Psoriatic arthritis (HCC)-she had no synovitis on my examination.  She has been tolerating leflunomide.  Psoriasis -she reports occasional psoriasis patches for which she uses topical agents.  High risk medication use - Arava 20 mg 1 tablet by mouth daily.  Previously was on Simponi Aria IV infusion 2 mg/kg every 8 weeks-patient discontinued in March 2021 due to C. difficile.  Her labs have been stable.  We will check labs today and then every 3 months to monitor for drug toxicity.  She was advised to discontinue leflunomide if she develops an  infection and to resume once infection resolves.  The recommendations regarding the fourth dose (booster) for COVID-19 was discussed.  She was advised to get the pneumococcal and Shingrix vaccine if due.  Primary osteoarthritis of both hands -she has PIP and DIP thickening.  Joint protection was discussed.  Primary osteoarthritis of right knee - She underwent visco gel injections in the right knee joint in march 2021.  H/O total knee replacement, left-she had good range of motion.  She states she has occasional discomfort.  DDD (degenerative disc disease), cervical s/p fusion-she has limited range of motion without discomfort.  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  She will be going to pain management for possible injections.  DDD lumbar spine status post fusion-she denies discomfort and has limited range of motion.  Fibromyalgia-she states her fibromyalgia is currently not very active.  Regular exercise was emphasized.  Other fatigue-related to insomnia.  Primary insomnia-has improved.  History of gastroesophageal reflux (GERD)  RLS (restless legs syndrome)  History of OCD (obsessive compulsive disorder)  History of hyperlipidemia-increased risk of heart disease with psoriatic arthritis was discussed.   Dietary management and exercise was emphasized.  Instructions were placed in the AVS.  History of migraine  History of hypertension-her blood pressure is elevated today. I advised her to monitor blood pressure closely and if it stays elevated she may contact her PCP.  Orders: Orders Placed This Encounter  Procedures  . CBC with Differential/Platelet  . COMPLETE METABOLIC PANEL WITH GFR   No orders of the defined types were placed in this encounter.   Follow-Up Instructions: Return in about 5 months (around 02/26/2021) for Psoriatic arthritis.   Bo Merino, MD  Note - This record has been created using Editor, commissioning.  Chart creation errors have been sought, but may not always  have been located. Such creation errors do not reflect on  the standard of medical care.

## 2020-09-18 DIAGNOSIS — R1084 Generalized abdominal pain: Secondary | ICD-10-CM | POA: Diagnosis not present

## 2020-09-18 DIAGNOSIS — B9689 Other specified bacterial agents as the cause of diseases classified elsewhere: Secondary | ICD-10-CM | POA: Diagnosis not present

## 2020-09-18 DIAGNOSIS — R634 Abnormal weight loss: Secondary | ICD-10-CM | POA: Diagnosis not present

## 2020-09-18 DIAGNOSIS — R140 Abdominal distension (gaseous): Secondary | ICD-10-CM | POA: Diagnosis not present

## 2020-09-18 DIAGNOSIS — R195 Other fecal abnormalities: Secondary | ICD-10-CM | POA: Diagnosis not present

## 2020-09-18 DIAGNOSIS — K5900 Constipation, unspecified: Secondary | ICD-10-CM | POA: Diagnosis not present

## 2020-09-23 ENCOUNTER — Other Ambulatory Visit: Payer: Self-pay | Admitting: Gastroenterology

## 2020-09-23 DIAGNOSIS — R634 Abnormal weight loss: Secondary | ICD-10-CM

## 2020-09-23 DIAGNOSIS — R110 Nausea: Secondary | ICD-10-CM

## 2020-09-23 DIAGNOSIS — R140 Abdominal distension (gaseous): Secondary | ICD-10-CM

## 2020-09-23 DIAGNOSIS — R109 Unspecified abdominal pain: Secondary | ICD-10-CM

## 2020-09-24 ENCOUNTER — Other Ambulatory Visit: Payer: Self-pay

## 2020-09-24 ENCOUNTER — Ambulatory Visit (INDEPENDENT_AMBULATORY_CARE_PROVIDER_SITE_OTHER): Payer: Medicare Other | Admitting: Family Medicine

## 2020-09-24 ENCOUNTER — Encounter: Payer: Self-pay | Admitting: Family Medicine

## 2020-09-24 VITALS — BP 130/66 | HR 94 | Temp 98.2°F | Resp 14 | Ht 64.0 in | Wt 187.0 lb

## 2020-09-24 DIAGNOSIS — M797 Fibromyalgia: Secondary | ICD-10-CM | POA: Diagnosis not present

## 2020-09-24 DIAGNOSIS — M961 Postlaminectomy syndrome, not elsewhere classified: Secondary | ICD-10-CM

## 2020-09-24 MED ORDER — TIZANIDINE HCL 4 MG PO TABS: 4.0000 mg | ORAL_TABLET | Freq: Four times a day (QID) | ORAL | 0 refills | Status: DC | PRN

## 2020-09-24 NOTE — Progress Notes (Signed)
Subjective:    Patient ID: Janet Peterson, female    DOB: 17-Mar-1951, 70 y.o.   MRN: [id number]  Hypertension  Abdominal Pain    6/21 Patient was recently admitted to the hospital with suicidal ideation.  She has since been discharged from behavioral health and requested a refill on her oxycodone.  However I wanted to see the patient first prior to refilling a controlled substance.  She is here today with her husband.  They both endorse that she is doing much better.  At the time, she was thinking about her son who has died.  She was having thoughts of wanting to overdose on her medication.  She states that she is no longer having those thoughts.  She is in a much better place.  Her psychiatrist increased her dose of fluvoxamine.  She is using Percocet at night to help her sleep and occasionally during the daytime if she walks a long distance.  She was started on vitamin D for vitamin D deficiency while in the hospital.  Vitamin D level was in the 20s.  At that time, my plan was: Patient states that she is doing better.  I am no longer concerned that she is actively suicidal.  I feel comfortable refilling her Percocet that she has been taking for quite some time.  Both she and her husband state that she is doing better with no concerns.  Therefore I will refill her Percocet, 60 tablets/month.  I will also refill the Roselyn Meier that she is taking as needed for migraines.  I recommended vitamin D 50,000 units weekly for 6 months and then recheck vitamin D level.  Also recommended that she decrease her Prilosec to 40 mg a day and if tolerating this in the future could try switching to Pepcid.  12/14/19 Blood pressure today is well controlled at 110/70. Patient is going on a 1 month vacation across the [place]. However her prescription will expire before she returns home from her vacation. She takes 1 Percocet a day due to pain in her joints and in her neck due to the psoriatic arthritis. She is  asking that I refill her prescription earlier so that she can take it with her. I would be happy to do that. She shows no evidence of abuse or diversion. She is also requesting a heel lift for her right shoe. 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 She benefits from a shoe lift on the right foot to help correct this which helps with hip pain and lower back pain   02/19/20 Patient states her symptoms began approximately 15 days ago.  She was driving down the road and suddenly became extremely nauseated.  Her husband had to pull over and she vomited approximately 6 times.  This occurred suddenly and without warning.  She then felt better however a few days later she started developing dark green stool.  She states that she was also having left-sided abdominal pain.  The pain is sharp.  Tends to be located on the left side.  She denies any dysuria or urgency or frequency.  She denies any hematuria.  She denies any melena or hematochezia.  She denies any reflux-like symptoms.  She denies any heartburn.  She does report constipation.  In fact the first week that the nausea and pain was occurring, the patient states that she was constipated and eventually had a taken Ex-Lax.  She  now states that she is having a bowel movement every day however it has changed in color and is green.  Furthermore she reports nausea as soon as she eats.  At that time, my plan was: I believe the patient's pain is likely due to constipation.  I believe that she may be so constipated that is distending her intestines causing pain and contributing to nausea.  I will obtain an x-ray to evaluate further to confirm my suspicions.  Meanwhile check a CBC to look for leukocytosis along with a CMP.  Other potential causes would be diverticulitis given the location of the pain.  If there is an elevated white blood cell count or the pain intensifies or if the x-ray shows no constipation, I  will treat the patient empirically for possible diverticulitis.  Begin Linzess 145 mcg daily  03/05/20 The x-ray that I obtained showed a large amount of stool in the colon.  Therefore I recommended that she try Linzess.  She did but then she went to the hospital in early October due to abdominal pain.  In the hospital chest x-ray was unremarkable.  CT scan was unremarkable.  CMP and CBC were unremarkable.  Patient was diagnosed with irritable bowel symptoms and given Bentyl for abdominal pain.  She has stopped the Linzess.  She is back to having a bowel movement every 3 or 4 days.  The Bentyl does help with the abdominal pain.  Zofran also helps with the abdominal pain and nausea and she would like to have a prescription for the Zofran.  At that time, my plan was: I believe her abdominal pain is due to a combination of constipation and irritable bowel.  I recommended that she stay on Linzess 145 mcg daily to try to have a bowel movement every day to help prevent the abdominal pain.  She can also certainly use Bentyl for intestinal spasms.  I encouraged her to eat more fiber and drink more water.  I will refill her Zofran.  03/22/20 Patient had to discontinue the Linzess because she felt like it was making her nausea worse.  Since I last saw her she states that the abdominal pains have subsided.  However she does report epigastric discomfort especially after she eats.  She reports nausea after eating.  She is taking the Zofran but she states it takes 2 hours for the Zofran to take effect.  She also reports having a difficult time sleeping.  She states that she stays awake at night unable to fall asleep.  She denies any melena or hematochezia.  She denies any fever or chills.  She denies any vomiting.  She had an EGD in May that revealed a hiatal hernia but was otherwise normal.  She had a colonoscopy in May as well.  She had a CT scan in October that revealed no explanation for her pain.  Therefore I believe  a lot of her symptoms could be related to irritable bowel plus medication side effects.  Of note she is taking Aleve every day for her back pain.  This could potentially be causing gastritis despite the fact she is on omeprazole.  At that time, my plan was: I believe the majority of her symptoms are IBS.  Therefore we need to focus on finding ways to help manage her symptoms.  I think the Aleve could be contributing some to her dyspepsia.  Therefore I recommended that she discontinue Aleve altogether and allow 2 weeks to see if the irritation in  her stomach will improve.  I also recommended that we try premedicating with Phenergan rather than waiting until she becomes nauseated and then taking Zofran.  I chose Phenergan because I am hoping that it will help her sleep better at night.  I recommended that she premedicate with Phenergan at night prior to bed.  Hopefully this will help her fall asleep easier and not be habit-forming.  I hope that this will also be still present somewhat when she wakes up in the morning so that she is not as nauseated in the morning.  She can then take a Phenergan again during the daytime to help with nausea prior to lunch and dinner.  Therefore we will schedule the Phenergan twice daily and see if this helps settle her stomach.  She has an appointment to follow-up with her gastroenterologist in December hopefully we can help alleviate some of her symptoms prior to that.   04/11/20 Unfortunately, the patient has seen no improvement since I saw her last.  She is not having abdominal pain but she still continues to have nausea.  She states that she can eat 5 or 6 bites and she instantly becomes full and feels nauseated.  She has been staying off all NSAIDs and this has not helped.  Phenergan twice a day has not helped as well.  The Bentyl has not helped.  She also had a normal abdominal ultrasound in April.  Therefore work-up today includes a normal abdominal ultrasound in April, and  EGD in colonoscopy in May, CT scan earlier this fall none of which have shown an explanation.  At the present time her biggest symptoms are nausea and early satiety.  She denies any severe fever although she does have night sweats.  At that time, my plan was: Given her dyspepsia, I will screen the patient for H pylori.  This has not been done yet.  Next on the differential diagnosis would be gastroparesis versus biliary dyskinesia.  She has had a normal right upper quadrant ultrasound but she has not had a HIDA scan.  I will empirically treat the patient with Reglan 10 mg before meals over the weekend to see if her symptoms improve.  If they do, I would recommend performing a gastric emptying test.  If they do not, I will try to expedite her GI consultation.  She also started Arava around the same time that symptoms began.  There is a 10% chance this can cause similar symptoms to what she is experiencing.  Therefore I recommended that she reach out to her rheumatologist and touch base with her to see if she feels like this could be playing any role.  05/02/20 Patient continues to experience nausea.  She has seen GI who scheduling her for a gastric emptying study to evaluate for gastroparesis.  She would like to try Compazine as needed for nausea as Zofran is not effective.  Phenergan has not been effective either.  She tried some of her husband's Compazine and that seemed to help.  Her blood pressures also been elevated recently at 150-170/90-100.  However here today her blood pressure is much better.  She just started hydrochlorothiazide 4 days ago.  I do not feel that is had adequate time to take full effect.  At that time, my plan was: I spent more than 30 minutes today with the patient and her husband.  I feel a lot of her symptoms could be due to polypharmacy and additive effect of all the medication she  is taking.  Therefore I am willing to give her Compazine 10 mg every 6 hours as needed however I  encouraged her to take it sparingly.  Also recommended that she stop tizanidine, Seroquel, and oxycodone to avoid anticholinergic and unintended side effects.  I would like her to talk with each of her physicians about trying to wean off medicine gradually to see if some of this could be medication side effect induced.  Meanwhile her blood pressure today is much better than it was 4 days ago.  Allow another week or so for the hydrochlorothiazide to take full effect and then see what her blood pressures are running.  05/23/20 Since I last saw the patient, not only did we have to add hydrochlorothiazide, but we also had to add amlodipine as her systolic blood pressure remained in the 170s.  She also had a gastric emptying study performed by GI which was "normal".  GI has referred her to a general surgeon to correct the hiatal hernia as that is their best guess as to the cause of her nausea.  However the patient continues to be extremely nauseated for no reason.  She states that she would just be sitting there and suddenly developed nausea and have to vomit.  This even occurred today during our encounter.  She was sitting calmly discussing the situation with me and her husband when suddenly she became extremely nauseated and suddenly had to vomit in the sink.  She had copious nonbilious emesis that I witnessed.  She states that this is occurring frequently at home without provocation.  Also recently she had an episode of syncope.  She was walking into the kitchen when she suddenly became dizzy and lost consciousness falling and injuring herself.  She was taken to the hospital where she was diagnosed with orthostatic hypotension due to dehydration and was given IV fluids.  Certainly her vomiting could explain dehydration however her blood pressure has been extremely labile over the last few weeks.  She is still taking amlodipine and her blood pressures at home have been in the 140-150 range without any orthostatic  drops.  She is here today for follow-up.  At that time, my plan was: Patient has now developed 2 symptoms concurrently.  She is having constant nausea and vomiting.  Extensive work-up thus far has been unrevealing.  However I am not certain that a hiatal hernia would cause sudden emesis without provocation.  She really denies any heartburn at the present time.  Also, her blood pressure has become very difficult to control recently.  She is had extremely high blood pressures which have never been an issue for her in the past.  After adding 2 separate medications, hydrochlorothiazide and amlodipine, her blood pressure is now in a reasonable range.  However she is also having sudden drops in her blood pressure leading to falls and syncope.  This could either be due to dehydration or vasovagal events.  However given the concurrence with her nausea I am starting to worry about possible autonomic dysfunction.  I would like to arrange a second opinion with neurology particular before she has surgery for a hiatal hernia as I am not certain that the hiatal hernia could be explaining her nausea.  I also do not believe that this is psychosomatic as I have witnessed the vomiting personally in the exam room.  Despite a normal gastric emptying study, I am going to try the patient on Reglan 10 mg p.o. q. ACH S to see  if she notices any improvement.  Continue hydrochlorothiazide as well as amlodipine.  Monitor blood pressure frequently throughout the day.  If blood pressure continues to drop, we will need to discontinue medication as needed.  I did give the patient a one-time prescription for oxycodone 10 mg/325 to be used sparingly as she injured herself in her fall and is having pain at the base of her neck and in her back after the fall despite normal imaging.  She has been having to use more of her Percocet 09/25/2023 and therefore will run out early.  Her insurance will not cover prescription sooner unless I change the dose.   Therefore I will change the dose to 10/325 and she can break these in half and use them as needed for breakthrough pain.  This is a one-time isolated occurrence due to her fall and the contusion she suffered to her back and neck and head were evaluated in the emergency room.   06/25/20 Patient never saw neurology.  She states that they never called her.  However thankfully, the nausea has improved.  She is not sure if she is taking the Reglan.  She does not believe that she is.  She states that she is not taking the Compazine anymore.  She is taking Seroquel at night.  She states that over the last several months she has been developed uncontrollable muscle movements particularly in her mouth and tongue.  She states that her tongue and her mouth which is start moving without her knowledge.  If she focuses she can control it and get it to stop however she will notice that is happening without her control.  She is also reports uncontrolled muscle movements in her feet.  She states that her feet will start twitching for no reason on both sides.  If she focuses again she can control it and stop it however when she is not focusing, she notices it.  The movements do not sound like jerking or tremor.  Instead they sound more like dyskinesia.  I suspect that this could be due to medications that she is taking such as the Compazine and Seroquel and Reglan.  At that time, my plan was: I am very thankful that the nausea has improved.  However I am concerned that the centrally acting dopamine medications that she is taken recently may have triggered tardive dyskinesia.  Therefore I want to remove all these medications from her system to see if the symptoms improve.  Therefore of asked her to make sure that she stopped Reglan, Seroquel, and Compazine.  Meanwhile I will consult neurology regarding the issues listed above such as autonomic dysregulation and also tardive dyskinesia.  Patient also admits to being short of breath  however this is been a 2-year issue for the patient.  She denies any angina or orthopnea.  She reports easy fatigability and shortness of breath going up steps.  Echocardiogram in 2020 was normal.  Given the fact that this is been a chronic problem, I believe most of this could be deconditioning.  She has no emergency symptoms that suggest angina.  Therefore I recommended starting a regular exercise program just walking at a slow steady pace for 5 minutes a day.  I would like her to gradually increase her exercise to build up her conditioning.  If she develops chest pain or worsening shortness of breath I would recommend a stress test and perhaps pulmonology consultation for pulmonary function test however I believe most of this is  deconditioning  09/24/20 Patient is here today for follow-up.  She is again reporting nausea.  She is seeing a gastroenterologist at [facility] and they have recently checked her for bacterial overgrowth and found that there was no evidence of this.  She continues to have nausea after eating that last a few hours.  We stop Reglan due to tardive dyskinesia.  At this point, I am out of suggestions.  Therefore I will have to defer to her gastroenterologist at [facility] to manage her nausea.  She is asking for referral to a pain clinic.  The patient saw Dr. Barbaraann Cao in the past and received Botox injections due to muscle spasms in her neck.  This helped her substantially.  She is again having muscle spasms however now they are occurring in the thoracic paraspinal muscles primarily on the left-hand side.  She is tender to palpation in the lower trapezius and left side as well as medial to the scapula and along the thoracic paraspinal muscles.  She reports spasms in this area whenever she stands for prolonged period of time such as washing dishes.  She would like to try Botox injections again.  She has not tried any muscle relaxers for this at present  Past Medical History:  Diagnosis Date  .  Ankylosing spondylitis ([place])   . Anxiety and depression   . Depression   . Fibromyalgia   . GERD (gastroesophageal reflux disease)   . Hiatal hernia    per patient, dx by GI   . History of basal cell carcinoma excision    FACE, [place]  . History of hiatal hernia   . History of thrush   . Hyperlipidemia   . Hypertension   . Insomnia   . Left breast mass   . OCD (obsessive compulsive disorder)   . OSA (obstructive sleep apnea)    MILD AND NO CPAP SINCE SURGERY IN 2007  . Osteopenia   . PONV (postoperative nausea and vomiting)   . Psoriatic arthritis ([place])   . PVC (premature ventricular contraction)   . Rheumatoid arthritis [facility])    Past Surgical History:  Procedure Laterality Date  . BIOPSY  09/29/2019   Procedure: BIOPSY;  Surgeon: Ronnette Juniper, MD;  Location: [facility];  Service: Gastroenterology;;  . BREAST LUMPECTOMY WITH RADIOACTIVE SEED LOCALIZATION Left 05/04/2018   Procedure: LEFT BREAST LUMPECTOMY WITH RADIOACTIVE SEED LOCALIZATION AND LEFT BREAST NIPPLE BIOPSY;  Surgeon: Jovita Kussmaul, MD;  Location: [place];  Service: General;  Laterality: Left;  . BREAST SURGERY  1975   lumpectomy-- benign  . BUNIONECTOMY  1991  . CATARACT EXTRACTION W/ INTRAOCULAR LENS  IMPLANT, BILATERAL  2000  . CERVICAL FUSION  March 2013   C5 -- C7  . CESAREAN SECTION  1985  . COLONOSCOPY WITH PROPOFOL N/A 09/29/2019   Procedure: COLONOSCOPY WITH PROPOFOL;  Surgeon: Ronnette Juniper, MD;  Location: [facility];  Service: Gastroenterology;  Laterality: N/A;  . DISTAL INTERPHALANGEAL JOINT FUSION Right 03/14/2015   Procedure: RIGHT LONG FINGER DISTAL INTERPHALANGEAL JOINT ARTHRODESIS;  Surgeon: Iran Planas, MD;  Location: [place];  Service: Orthopedics;  Laterality: Right;  . ESOPHAGOGASTRODUODENOSCOPY (EGD) WITH PROPOFOL N/A 09/29/2019   Procedure: ESOPHAGOGASTRODUODENOSCOPY (EGD) WITH PROPOFOL;  Surgeon: Ronnette Juniper, MD;  Location: [facility];  Service:  Gastroenterology;  Laterality: N/A;  . EYE SURGERY  1995   rk (laser surgery), semi cornea transplant, detacted retina,  fluid removal  . KNEE ARTHROSCOPY Left 03-03-2004  .  POLYPECTOMY  09/29/2019   Procedure: POLYPECTOMY;  Surgeon: Ronnette Juniper, MD;  Location: [facility];  Service: Gastroenterology;;  . POSTERIOR VITRECTOMY RIGHT EYE AND LASER   10-27-1999  . SHOULDER SURGERY Right 1997  . SPINAL FIXATION SURGERY W/ IMPLANT  2013 rod #1//  2014  rod 2   S1 -- T10  (rod #1)//   S1 -- T4 (rod #2)  . THORACIC FUSION  03-13-2013   REMOVAL HARDWARE/  BONE GRAFT FUSION T10//  REVISION OF RODS  . TOTAL KNEE ARTHROPLASTY Left 12-14-2005  . UVULOPALATOPHARYNGOPLASTY  04-26-2006   w/  TONSILLECTOMY/  TURBINATE REDUCTIONS/  BILATERAL ANTERIOR ETHMOIDECTOMY   Current Outpatient Medications on File Prior to Visit  Medication Sig Dispense Refill  . amLODipine (NORVASC) 10 MG tablet Take 1 tablet (10 mg total) by mouth daily. 90 tablet 3  . buPROPion (WELLBUTRIN XL) 300 MG 24 hr tablet Take 300 mg by mouth every morning.  1  . Capsaicin-Menthol (SALONPAS GEL EX) Apply 1 application topically daily as needed (pain). (Patient not taking: Reported on 09/03/2020)    . clonazePAM (KLONOPIN) 1 MG tablet TAKE 1 TABLET BY MOUTH EVERY DAY AS NEEDED FOR ANXIETY (Patient taking differently: Take 1 mg by mouth daily as needed for anxiety.) 30 tablet 1  . ezetimibe (ZETIA) 10 MG tablet TAKE 1 TABLET BY MOUTH EVERY DAY 90 tablet 3  . fluticasone (FLONASE) 50 MCG/ACT nasal spray Place 2 sprays into both nostrils daily as needed for allergies.     . fluvoxaMINE (LUVOX) 100 MG tablet Take 125 mg by mouth in the morning, at noon, and at bedtime.  (Patient not taking: Reported on 09/03/2020)    . leflunomide (ARAVA) 20 MG tablet Take 1 tablet (20 mg total) by mouth daily. 90 tablet 0  . Menthol-Methyl Salicylate (SALONPAS PAIN RELIEF PATCH EX) Apply 1 application topically daily as needed (pain). (Patient not taking:  Reported on 09/03/2020)    . metoCLOPramide (REGLAN) 10 MG tablet Take 1 tablet (10 mg total) by mouth 4 (four) times daily -  before meals and at bedtime. (Patient not taking: No sig reported) 120 tablet 2  . metoprolol succinate (TOPROL-XL) 50 MG 24 hr tablet TAKE 1 TABLET BY MOUTH ONCE DAILY FOLLOWING A MEAL (Patient taking differently: Take 50 mg by mouth daily.) 90 tablet 3  . naproxen sodium (ALEVE) 220 MG tablet Take 440 mg by mouth daily as needed (headache).    Marland Kitchen omeprazole (PRILOSEC) 40 MG capsule TAKE 1 CAPSULE BY MOUTH EVERY MORNING AND AT BEDTIME 180 capsule 1  . ondansetron (ZOFRAN) 4 MG tablet Take 4 mg by mouth 4 (four) times daily as needed for nausea.    Marland Kitchen oxyCODONE-acetaminophen (PERCOCET) 5-325 MG tablet Take 1 tablet by mouth at bedtime. 60 tablet 0  . Probiotic Product (PROBIOTIC PO) Take 1 tablet by mouth daily after breakfast.    . prochlorperazine (COMPAZINE) 10 MG tablet Take 1 tablet (10 mg total) by mouth every 6 (six) hours as needed for nausea or vomiting. 30 tablet 0  . promethazine (PHENERGAN) 25 MG tablet TAKE 1 TABLET (25 MG TOTAL) BY MOUTH 2 (TWO) TIMES DAILY AS NEEDED FOR NAUSEA OR VOMITING. 60 tablet 0  . QUEtiapine (SEROQUEL) 25 MG tablet Take 25 mg by mouth at bedtime.     Marland Kitchen tiZANidine (ZANAFLEX) 4 MG tablet TAKE 1 TABLET (4 MG TOTAL) BY MOUTH EVERY 6 (SIX) HOURS AS NEEDED FOR MUSCLE SPASMS. 30 tablet 0  . valsartan (DIOVAN) 320 MG tablet  TAKE 1 TABLET BY MOUTH EVERY DAY 90 tablet 3  . Vitamin D, Ergocalciferol, (DRISDOL) 1.25 MG (50000 UNIT) CAPS capsule TAKE 1 CAPSULE BY MOUTH EVERY 7 DAYS 5 capsule 5   No current facility-administered medications on file prior to visit.   Allergies  Allergen Reactions  . Latex Rash    And Mouth sores Other reaction(s): rash  . Amlodipine Besy-Benazepril Hcl Other (See Comments)    COUGH  . Augmentin [Amoxicillin-Pot Clavulanate] Diarrhea and Nausea And Vomiting  . Bextra [Valdecoxib] Diarrhea and Nausea And Vomiting     REFLUX  . Bupropion     Other reaction(s): rash, hives, nausea, high BP , sores,  . Chlorhexidine     rash  . Lipitor [Atorvastatin Calcium] Other (See Comments)    MYALGIAS  . Sulfa Antibiotics Nausea And Vomiting    CRAMPING  . Zocor [Simvastatin] Other (See Comments)    MYALGIA   Social History   Socioeconomic History  . Marital status: Married    Spouse name: Daryl  . Number of children: Not on file  . Years of education: Not on file  . Highest education level: Not on file  Occupational History    Comment: retired  Tobacco Use  . Smoking status: Former Smoker    Packs/day: 0.50    Years: 10.00    Pack years: 5.00    Types: Cigarettes    Quit date: 03/07/1995    Years since quitting: 25.5  . Smokeless tobacco: Never Used  Vaping Use  . Vaping Use: Never used  Substance and Sexual Activity  . Alcohol use: Yes    Comment: rare  . Drug use: Never  . Sexual activity: Not on file  Other Topics Concern  . Not on file  Social History Narrative   Lives with husband   Social Determinants of Health   Financial Resource Strain: Not on file  Food Insecurity: Not on file  Transportation Needs: Not on file  Physical Activity: Not on file  Stress: Not on file  Social Connections: Not on file  Intimate Partner Violence: Not on file    Review of Systems  Gastrointestinal: Positive for abdominal pain.  All other systems reviewed and are negative.      Objective:   Physical Exam Vitals reviewed.  Constitutional:      General: She is not in acute distress.    Appearance: Normal appearance. She is normal weight. She is not ill-appearing or toxic-appearing.  Cardiovascular:     Rate and Rhythm: Normal rate and regular rhythm.     Heart sounds: Normal heart sounds. No murmur heard. No friction rub. No gallop.   Pulmonary:     Effort: Pulmonary effort is normal. No respiratory distress.     Breath sounds: Normal breath sounds. No wheezing, rhonchi or rales.   Abdominal:     General: Abdomen is flat. Bowel sounds are normal. There is no distension.     Palpations: Abdomen is soft.     Tenderness: There is no abdominal tenderness.  Musculoskeletal:     Thoracic back: Deformity, spasms and tenderness present. No swelling. Decreased range of motion.     Lumbar back: Tenderness present. Decreased range of motion.       Back:     Right lower leg: No edema.     Left lower leg: No edema.  Neurological:     Mental Status: She is alert.           Assessment & Plan:  Thoracic postlaminectomy syndrome  Fibromyalgia syndrome  Patient has extensive surgical history in the thoracic spine including laminectomy and fusion.  I believe she is having compensatory muscle spasms.  I recommended trying Zanaflex 4 mg every 8 hours as needed for muscle spasms.  I will consult the pain clinic to see if they can help with possible Botox injections due to her recurrent muscle spasms in the thoracic paraspinal area.  I believe this is also exacerbated by her fibromyalgia.  I do not want to increase her narcotics to manage this.

## 2020-09-26 ENCOUNTER — Encounter: Payer: Self-pay | Admitting: Rheumatology

## 2020-09-26 ENCOUNTER — Other Ambulatory Visit: Payer: Self-pay

## 2020-09-26 ENCOUNTER — Ambulatory Visit (INDEPENDENT_AMBULATORY_CARE_PROVIDER_SITE_OTHER): Payer: Medicare Other | Admitting: Rheumatology

## 2020-09-26 VITALS — BP 147/100 | HR 94 | Ht 64.0 in | Wt 188.8 lb

## 2020-09-26 DIAGNOSIS — Z79899 Other long term (current) drug therapy: Secondary | ICD-10-CM

## 2020-09-26 DIAGNOSIS — M47816 Spondylosis without myelopathy or radiculopathy, lumbar region: Secondary | ICD-10-CM | POA: Diagnosis not present

## 2020-09-26 DIAGNOSIS — Z8669 Personal history of other diseases of the nervous system and sense organs: Secondary | ICD-10-CM

## 2020-09-26 DIAGNOSIS — M797 Fibromyalgia: Secondary | ICD-10-CM

## 2020-09-26 DIAGNOSIS — Z96652 Presence of left artificial knee joint: Secondary | ICD-10-CM | POA: Diagnosis not present

## 2020-09-26 DIAGNOSIS — L409 Psoriasis, unspecified: Secondary | ICD-10-CM | POA: Diagnosis not present

## 2020-09-26 DIAGNOSIS — Z8639 Personal history of other endocrine, nutritional and metabolic disease: Secondary | ICD-10-CM

## 2020-09-26 DIAGNOSIS — M19041 Primary osteoarthritis, right hand: Secondary | ICD-10-CM

## 2020-09-26 DIAGNOSIS — F5101 Primary insomnia: Secondary | ICD-10-CM

## 2020-09-26 DIAGNOSIS — G2581 Restless legs syndrome: Secondary | ICD-10-CM

## 2020-09-26 DIAGNOSIS — M5030 Other cervical disc degeneration, unspecified cervical region: Secondary | ICD-10-CM

## 2020-09-26 DIAGNOSIS — M19042 Primary osteoarthritis, left hand: Secondary | ICD-10-CM

## 2020-09-26 DIAGNOSIS — L4050 Arthropathic psoriasis, unspecified: Secondary | ICD-10-CM

## 2020-09-26 DIAGNOSIS — Z8719 Personal history of other diseases of the digestive system: Secondary | ICD-10-CM

## 2020-09-26 DIAGNOSIS — Z8659 Personal history of other mental and behavioral disorders: Secondary | ICD-10-CM

## 2020-09-26 DIAGNOSIS — R5383 Other fatigue: Secondary | ICD-10-CM

## 2020-09-26 DIAGNOSIS — M5134 Other intervertebral disc degeneration, thoracic region: Secondary | ICD-10-CM

## 2020-09-26 DIAGNOSIS — M1711 Unilateral primary osteoarthritis, right knee: Secondary | ICD-10-CM

## 2020-09-26 DIAGNOSIS — Z8679 Personal history of other diseases of the circulatory system: Secondary | ICD-10-CM

## 2020-09-26 NOTE — Patient Instructions (Signed)
Standing Labs We placed an order today for your standing lab work.   Please have your standing labs drawn in August and every 3 months  If possible, please have your labs drawn 2 weeks prior to your appointment so that the provider can discuss your results at your appointment.  We have open lab daily Monday through Thursday from 1:30-4:30 PM and Friday from 1:30-4:00 PM at the office of Dr. Jelesa Mangini, [facility].   Please be advised, all patients with office appointments requiring lab work will take precedents over walk-in lab work.  If possible, please come for your lab work on Monday and Friday afternoons, as you may experience shorter wait times. The office is located at [address], [address], [place], [place] [address] No appointment is necessary.   Labs are drawn by [facility]. Please bring your co-pay at the time of your lab draw.  You may receive a bill from [facility] for your lab work.  If you wish to have your labs drawn at another location, please call the office 24 hours in advance to send orders.  If you have any questions regarding directions or hours of operation,  please call [phone].   As a reminder, please drink plenty of water prior to coming for your lab work. Thanks!   Vaccines You are taking a medication(s) that can suppress your immune system.  The following immunizations are recommended: . Flu annually . Covid-19  . Pneumonia (Pneumovax 23 and Prevnar 13 spaced at least 1 year apart) . Shingrix (after age 50)  Please check with your PCP to make sure you are up to date.  Heart Disease Prevention   Your inflammatory disease increases your risk of heart disease which includes heart attack, stroke, atrial fibrillation (irregular heartbeats), high blood pressure, heart failure and atherosclerosis (plaque in the arteries).  It is important to reduce your risk by:   . Keep blood pressure, cholesterol, and blood sugar at healthy levels    . Smoking Cessation   . Maintain a healthy weight  o BMI 20-25   . Eat a healthy diet  o Plenty of fresh fruit, vegetables, and whole grains  o Limit saturated fats, foods high in sodium, and added sugars  o DASH and Mediterranean diet   . Increase physical activity  o Recommend moderate physically activity for 150 minutes per week/ 30 minutes a day for five days a week These can be broken up into three separate ten-minute sessions during the day.   . Reduce Stress  . Meditation, slow breathing exercises, yoga, coloring books  . Dental visits twice a year   

## 2020-09-27 LAB — COMPLETE METABOLIC PANEL WITH GFR
AG Ratio: 1.5 (calc) (ref 1.0–2.5)
ALT: 10 U/L (ref 6–29)
AST: 23 U/L (ref 10–35)
Albumin: 3.7 g/dL (ref 3.6–5.1)
Alkaline phosphatase (APISO): 113 U/L (ref 37–153)
BUN: 10 mg/dL (ref 7–25)
CO2: 32 mmol/L (ref 20–32)
Calcium: 9.6 mg/dL (ref 8.6–10.4)
Chloride: 99 mmol/L (ref 98–110)
Creat: 0.84 mg/dL (ref 0.60–0.93)
GFR, Est African American: 82 mL/min/{1.73_m2} (ref >=60)
GFR, Est Non African American: 70 mL/min/{1.73_m2} (ref >=60)
Globulin: 2.5 g/dL (calc) (ref 1.9–3.7)
Glucose, Bld: 135 mg/dL — ABNORMAL HIGH (ref 65–99)
Potassium: 3.7 mmol/L (ref 3.5–5.3)
Sodium: 140 mmol/L (ref 135–146)
Total Bilirubin: 0.4 mg/dL (ref 0.2–1.2)
Total Protein: 6.2 g/dL (ref 6.1–8.1)

## 2020-09-27 LAB — CBC WITH DIFFERENTIAL/PLATELET
Absolute Monocytes: 555 cells/uL (ref 200–950)
Basophils Absolute: 83 cells/uL (ref 0–200)
Basophils Relative: 1.4 %
Eosinophils Absolute: 112 cells/uL (ref 15–500)
Eosinophils Relative: 1.9 %
HCT: 34.9 % — ABNORMAL LOW (ref 35.0–45.0)
Hemoglobin: 11.6 g/dL — ABNORMAL LOW (ref 11.7–15.5)
Lymphs Abs: 2130 cells/uL (ref 850–3900)
MCH: 31.9 pg (ref 27.0–33.0)
MCHC: 33.2 g/dL (ref 32.0–36.0)
MCV: 95.9 fL (ref 80.0–100.0)
MPV: 11.2 fL (ref 7.5–12.5)
Monocytes Relative: 9.4 %
Neutro Abs: 3021 cells/uL (ref 1500–7800)
Neutrophils Relative %: 51.2 %
Platelets: 244 10*3/uL (ref 140–400)
RBC: 3.64 10*6/uL — ABNORMAL LOW (ref 3.80–5.10)
RDW: 12.5 % (ref 11.0–15.0)
Total Lymphocyte: 36.1 %
WBC: 5.9 10*3/uL (ref 3.8–10.8)

## 2020-10-01 ENCOUNTER — Encounter: Payer: Self-pay | Admitting: Physical Medicine & Rehabilitation

## 2020-10-02 ENCOUNTER — Ambulatory Visit
Admission: RE | Admit: 2020-10-02 | Discharge: 2020-10-02 | Disposition: A | Discharge status: Home / Self Care | Payer: Medicare Other | Source: Ambulatory Visit | Attending: Gastroenterology | Admitting: Gastroenterology

## 2020-10-02 DIAGNOSIS — R110 Nausea: Secondary | ICD-10-CM

## 2020-10-02 DIAGNOSIS — K7689 Other specified diseases of liver: Secondary | ICD-10-CM | POA: Diagnosis not present

## 2020-10-02 DIAGNOSIS — R109 Unspecified abdominal pain: Secondary | ICD-10-CM

## 2020-10-02 DIAGNOSIS — R140 Abdominal distension (gaseous): Secondary | ICD-10-CM

## 2020-10-02 DIAGNOSIS — K5730 Diverticulosis of large intestine without perforation or abscess without bleeding: Secondary | ICD-10-CM | POA: Diagnosis not present

## 2020-10-02 DIAGNOSIS — R634 Abnormal weight loss: Secondary | ICD-10-CM

## 2020-10-02 DIAGNOSIS — E278 Other specified disorders of adrenal gland: Secondary | ICD-10-CM | POA: Diagnosis not present

## 2020-10-02 MED ORDER — IOPAMIDOL (ISOVUE-300) INJECTION 61%
100.0000 mL | Freq: Once | INTRAVENOUS | Status: AC | PRN
  Administered 2020-10-02: 100 mL via INTRAVENOUS

## 2020-10-07 ENCOUNTER — Other Ambulatory Visit: Payer: Self-pay | Admitting: Gastroenterology

## 2020-10-07 DIAGNOSIS — K769 Liver disease, unspecified: Secondary | ICD-10-CM

## 2020-10-10 ENCOUNTER — Other Ambulatory Visit: Payer: Self-pay

## 2020-10-10 ENCOUNTER — Ambulatory Visit (INDEPENDENT_AMBULATORY_CARE_PROVIDER_SITE_OTHER): Payer: Medicare Other

## 2020-10-10 DIAGNOSIS — Z0000 Encounter for general adult medical examination without abnormal findings: Secondary | ICD-10-CM

## 2020-10-10 DIAGNOSIS — Z1231 Encounter for screening mammogram for malignant neoplasm of breast: Secondary | ICD-10-CM

## 2020-10-10 DIAGNOSIS — Z780 Asymptomatic menopausal state: Secondary | ICD-10-CM | POA: Diagnosis not present

## 2020-10-10 NOTE — Progress Notes (Addendum)
Subjective:   Janet Peterson is a 70 y.o. female who presents for Medicare Annual (Subsequent) preventive examination.  I connected with Latrisa Onsurez today by telephone and verified that I am speaking with the correct person using two identifiers. Location patient: home Location provider: work Persons participating in the virtual visit: patient, provider.   I discussed the limitations, risks, security and privacy concerns of performing an evaluation and management service by telephone and the availability of in person appointments. I also discussed with the patient that there may be a patient responsible charge related to this service. The patient expressed understanding and verbally consented to this telephonic visit.    Interactive audio and video telecommunications were attempted between this provider and patient, however failed, due to patient having technical difficulties OR patient did not have access to video capability.  We continued and completed visit with audio only.      Review of Systems    N/A  Cardiac Risk Factors include: advanced age (>82men, >45 women);hypertension;dyslipidemia     Objective:    Today's Vitals   There is no height or weight on file to calculate BMI.  Advanced Directives 10/10/2020 04/25/2020 02/24/2020 10/28/2019 09/29/2019 09/28/2019 05/07/2018  Does Patient Have a Medical Advance Directive? Yes No No;Yes No - No No  Type of Paramedic of [place];Living will - [place];Living will - - - -  Does patient want to make changes to medical advance directive? No - Patient declined - - - - - -  Copy of [place] in Chart? No - copy requested - - - - - -  Would patient like information on creating a medical advance directive? - No - Patient declined - No - Patient declined No - Patient declined - -    Current Medications (verified) Outpatient Encounter Medications as of 10/10/2020  Medication Sig  .  Capsaicin-Menthol (SALONPAS GEL EX) Apply 1 application topically daily as needed (pain).  . clonazePAM (KLONOPIN) 1 MG tablet TAKE 1 TABLET BY MOUTH EVERY DAY AS NEEDED FOR ANXIETY (Patient taking differently: Take 1 mg by mouth daily as needed for anxiety.)  . doxepin (SINEQUAN) 50 MG capsule Take 50 mg by mouth.  . ezetimibe (ZETIA) 10 MG tablet TAKE 1 TABLET BY MOUTH EVERY DAY  . fluticasone (FLONASE) 50 MCG/ACT nasal spray Place 2 sprays into both nostrils daily as needed for allergies.   Marland Kitchen leflunomide (ARAVA) 20 MG tablet Take 1 tablet (20 mg total) by mouth daily.  . metoprolol succinate (TOPROL-XL) 50 MG 24 hr tablet TAKE 1 TABLET BY MOUTH ONCE DAILY FOLLOWING A MEAL (Patient taking differently: Take 50 mg by mouth daily.)  . omeprazole (PRILOSEC) 40 MG capsule TAKE 1 CAPSULE BY MOUTH EVERY MORNING AND AT BEDTIME  . oxyCODONE-acetaminophen (PERCOCET) 5-325 MG tablet Take 1 tablet by mouth at bedtime.  Marland Kitchen tiZANidine (ZANAFLEX) 4 MG tablet Take 1 tablet (4 mg total) by mouth every 6 (six) hours as needed for muscle spasms.  . valsartan (DIOVAN) 320 MG tablet TAKE 1 TABLET BY MOUTH EVERY DAY  . Vitamin D, Ergocalciferol, (DRISDOL) 1.25 MG (50000 UNIT) CAPS capsule TAKE 1 CAPSULE BY MOUTH EVERY 7 DAYS  . buPROPion (WELLBUTRIN XL) 300 MG 24 hr tablet Take 300 mg by mouth every morning. (Patient not taking: Reported on 10/10/2020)  . ondansetron (ZOFRAN) 4 MG tablet Take 4 mg by mouth 4 (four) times daily as needed for nausea. (Patient not taking: Reported on 10/10/2020)  No facility-administered encounter medications on file as of 10/10/2020.    Allergies (verified) Latex, Amlodipine besy-benazepril hcl, Augmentin [amoxicillin-pot clavulanate], Bextra [valdecoxib], Chlorhexidine, Lipitor [atorvastatin calcium], Sulfa antibiotics, and Zocor [simvastatin]   History: Past Medical History:  Diagnosis Date  . Ankylosing spondylitis ([place])   . Anxiety and depression   . Depression   .  Fibromyalgia   . GERD (gastroesophageal reflux disease)   . Hiatal hernia    per patient, dx by GI   . History of basal cell carcinoma excision    FACE, [place]  . History of hiatal hernia   . History of thrush   . Hyperlipidemia   . Hypertension   . Insomnia   . Left breast mass   . OCD (obsessive compulsive disorder)   . OSA (obstructive sleep apnea)    MILD AND NO CPAP SINCE SURGERY IN 2007  . Osteopenia   . PONV (postoperative nausea and vomiting)   . Psoriatic arthritis ([place])   . PVC (premature ventricular contraction)   . Rheumatoid arthritis [facility])    Past Surgical History:  Procedure Laterality Date  . BIOPSY  09/29/2019   Procedure: BIOPSY;  Surgeon: Ronnette Juniper, MD;  Location: [facility];  Service: Gastroenterology;;  . BREAST LUMPECTOMY WITH RADIOACTIVE SEED LOCALIZATION Left 05/04/2018   Procedure: LEFT BREAST LUMPECTOMY WITH RADIOACTIVE SEED LOCALIZATION AND LEFT BREAST NIPPLE BIOPSY;  Surgeon: Jovita Kussmaul, MD;  Location: [place];  Service: General;  Laterality: Left;  . BREAST SURGERY  1975   lumpectomy-- benign  . BUNIONECTOMY  1991  . CATARACT EXTRACTION W/ INTRAOCULAR LENS  IMPLANT, BILATERAL  2000  . CERVICAL FUSION  March 2013   C5 -- C7  . CESAREAN SECTION  1985  . COLONOSCOPY WITH PROPOFOL N/A 09/29/2019   Procedure: COLONOSCOPY WITH PROPOFOL;  Surgeon: Ronnette Juniper, MD;  Location: [facility];  Service: Gastroenterology;  Laterality: N/A;  . DISTAL INTERPHALANGEAL JOINT FUSION Right 03/14/2015   Procedure: RIGHT LONG FINGER DISTAL INTERPHALANGEAL JOINT ARTHRODESIS;  Surgeon: Iran Planas, MD;  Location: [place];  Service: Orthopedics;  Laterality: Right;  . ESOPHAGOGASTRODUODENOSCOPY (EGD) WITH PROPOFOL N/A 09/29/2019   Procedure: ESOPHAGOGASTRODUODENOSCOPY (EGD) WITH PROPOFOL;  Surgeon: Ronnette Juniper, MD;  [facility];  Service: Gastroenterology;  Laterality: N/A;  . EYE SURGERY  1995   rk (laser  surgery), semi cornea transplant, detacted retina,  fluid removal  . KNEE ARTHROSCOPY Left 03-03-2004  . POLYPECTOMY  09/29/2019   Procedure: POLYPECTOMY;  Surgeon: Ronnette Juniper, MD;  Location[facility];  Service: Gastroenterology;;  . POSTERIOR VITRECTOMY RIGHT EYE AND LASER   10-27-1999  . SHOULDER SURGERY Right 1997  . SPINAL FIXATION SURGERY W/ IMPLANT  2013 rod #1//  2014  rod 2   S1 -- T10  (rod #1)//   S1 -- T4 (rod #2)  . THORACIC FUSION  03-13-2013   REMOVAL HARDWARE/  BONE GRAFT FUSION T10//  REVISION OF RODS  . TOTAL KNEE ARTHROPLASTY Left 12-14-2005  . UVULOPALATOPHARYNGOPLASTY  04-26-2006   w/  TONSILLECTOMY/  TURBINATE REDUCTIONS/  BILATERAL ANTERIOR ETHMOIDECTOMY   Family History  Problem Relation Age of Onset  . Diabetes Mother   . Heart disease Mother   . Diabetes Father   . Anuerysm Father   . Diabetes Brother   . Heart disease Brother   . Diabetes Brother   . Heart disease Brother    Social History   Socioeconomic History  . Marital status: Married  Spouse name: Daryl  . Number of children: Not on file  . Years of education: Not on file  . Highest education level: Not on file  Occupational History    Comment: retired  Tobacco Use  . Smoking status: Former Smoker    Packs/day: 0.50    Years: 10.00    Pack years: 5.00    Types: Cigarettes    Quit date: 03/07/1995    Years since quitting: 25.6  . Smokeless tobacco: Never Used  Vaping Use  . Vaping Use: Never used  Substance and Sexual Activity  . Alcohol use: Yes    Comment: rare  . Drug use: Never  . Sexual activity: Not on file  Other Topics Concern  . Not on file  Social History Narrative   Lives with husband   Social Determinants of Health   Financial Resource Strain: Low Risk   . Difficulty of Paying Living Expenses: Not hard at all  Food Insecurity: No Food Insecurity  . Worried About Charity fundraiser in the Last Year: Never true  . Ran Out of Food in the Last Year: Never true   Transportation Needs: No Transportation Needs  . Lack of Transportation (Medical): No  . Lack of Transportation (Non-Medical): No  Physical Activity: Inactive  . Days of Exercise per Week: 0 days  . Minutes of Exercise per Session: 0 min  Stress: No Stress Concern Present  . Feeling of Stress : Not at all  Social Connections: Moderately Isolated  . Frequency of Communication with Friends and Family: More than three times a week  . Frequency of Social Gatherings with Friends and Family: Once a week  . Attends Religious Services: Never  . Active Member of Clubs or Organizations: No  . Attends Archivist Meetings: Never  . Marital Status: Married    Tobacco Counseling Counseling given: Not Answered   Clinical Intake:  Pre-visit preparation completed: Yes  Pain : No/denies pain     Nutritional Risks: Unintentional weight loss,Nausea/ vomitting/ diarrhea (has had some unintentional weight loss over the pass 6 months) Diabetes: No  How often do you need to have someone help you when you read instructions, pamphlets, or other written materials from your doctor or pharmacy?: 1 - Never  Diabetic?No  Interpreter Needed?: No  Information entered by :: [place] of Daily Living In your present state of health, do you have any difficulty performing the following activities: 10/10/2020 10/28/2019  Hearing? Y N  Comment has some hearing loss was told by audiologist that could use hearing aids but does not need -  Vision? N N  Difficulty concentrating or making decisions? N N  Walking or climbing stairs? N N  Dressing or bathing? N N  Doing errands, shopping? N -  Preparing Food and eating ? N -  Using the Toilet? N -  In the past six months, have you accidently leaked urine? N -  Do you have problems with loss of bowel control? N -  Managing your Medications? N -  Managing your Finances? N -  Housekeeping or managing your Housekeeping? N -  Some recent  data might be hidden    Patient Care Team: Susy Frizzle, MD as PCP - General (Family Medicine) Bo Merino, MD as Consulting Physician (Rheumatology)  Indicate any recent Medical Services you may have received from other than [facility] providers in the past year (date may be approximate).     Assessment:   This  is a routine wellness examination for Janet Peterson.  Hearing/Vision screen  Hearing Screening   125Hz  250Hz  500Hz  1000Hz  2000Hz  3000Hz  4000Hz  6000Hz  8000Hz   Right ear:           Left ear:           Vision Screening Comments: Patient states she gets her eyes examined once per year.   Dietary issues and exercise activities discussed: Current Exercise Habits: The patient does not participate in regular exercise at present, Exercise limited by: orthopedic condition(s)  Goals Addressed            This Visit's Progress   . Exercise 3x per week (30 min per time)      . Patient Stated       I would like to get to pain management to get botox injections in my back       Depression Screen PHQ 2/9 Scores 10/10/2020 09/24/2020 06/25/2020 05/10/2017 03/09/2017 09/10/2016 12/09/2015  PHQ - 2 Score 0 0 0 0 3 4 2   PHQ- 9 Score 0 0 - - 8 13 7     Fall Risk Fall Risk  10/10/2020 09/24/2020 06/25/2020 05/10/2017 03/09/2017  Falls in the past year? 1 0 1 No No  Comment had a fall due to orthostatic blood pressure - - - -  Number falls in past yr: 0 - 0 - -  Injury with Fall? 0 - 1 - -  Risk for fall due to : Medication side effect No Fall Risks - - -  Follow up Falls evaluation completed;Falls prevention discussed Falls evaluation completed Falls evaluation completed - -    FALL RISK PREVENTION PERTAINING TO THE HOME:  Any stairs in or around the home? Yes  If so, are there any without handrails? No  Home free of loose throw rugs in walkways, pet beds, electrical cords, etc? Yes  Adequate lighting in your home to reduce risk of falls? Yes   ASSISTIVE DEVICES UTILIZED TO PREVENT  FALLS:  Life alert? No  Use of a cane, walker or w/c? No  Grab bars in the bathroom? No  Shower chair or bench in shower? No  Elevated toilet seat or a handicapped toilet? Yes     Cognitive Function:   Normal cognitive status assessed by direct observation by this Nurse Health Advisor. No abnormalities found.        Immunizations Immunization History  Administered Date(s) Administered  . Fluad Quad(high Dose 65+) 01/26/2019, 03/05/2020  . Influenza Whole 03/04/2012  . Influenza, High Dose Seasonal PF 03/25/2018  . Influenza,inj,Quad PF,6+ Mos 02/28/2013, 03/07/2014, 02/28/2015, 03/27/2016, 03/09/2017  . Pneumococcal Conjugate-13 09/10/2016  . Pneumococcal Polysaccharide-23 03/12/2006, 11/09/2011  . Unspecified SARS-COV-2 Vaccination 08/07/2019, 08/28/2019, 03/08/2020    TDAP status: Due, Education has been provided regarding the importance of this vaccine. Advised may receive this vaccine at local pharmacy or Health Dept. Aware to provide a copy of the vaccination record if obtained from local pharmacy or Health Dept. Verbalized acceptance and understanding.  Flu Vaccine status: Up to date  Pneumococcal vaccine status: Up to date  Covid-19 vaccine status: Completed vaccines  Qualifies for Shingles Vaccine? Yes   Zostavax completed No   Shingrix Completed?: Yes  Screening Tests Health Maintenance  Topic Date Due  . Hepatitis C Screening  Never done  . TETANUS/TDAP  Never done  . PNA vac Low Risk Adult (2 of 2 - PPSV23) 09/10/2017  . MAMMOGRAM  04/04/2020  . INFLUENZA VACCINE  12/23/2020  . COLONOSCOPY (Pts 45-9yrs  Insurance coverage will need to be confirmed)  09/28/2029  . DEXA SCAN  Completed  . COVID-19 Vaccine  Completed  . HPV VACCINES  Aged Out    Health Maintenance  Health Maintenance Due  Topic Date Due  . Hepatitis C Screening  Never done  . TETANUS/TDAP  Never done  . PNA vac Low Risk Adult (2 of 2 - PPSV23) 09/10/2017  . MAMMOGRAM  04/04/2020     Colorectal cancer screening: Type of screening: Colonoscopy. Completed 09/29/2019. Repeat every 10 years  Mammogram status: Ordered 10/10/2020. Pt provided with contact info and advised to call to schedule appt.   Bone Density status: Ordered 10/10/2020. Pt provided with contact info and advised to call to schedule appt.  Lung Cancer Screening: (Low Dose CT Chest recommended if Age 23-80 years, 30 pack-year currently smoking OR have quit w/in 15years.) does qualify.   Lung Cancer Screening Referral: N/A   Additional Screening:  Hepatitis C Screening: does qualify;   Vision Screening: Recommended annual ophthalmology exams for early detection of glaucoma and other disorders of the eye. Is the patient up to date with their annual eye exam?  Yes  Who is the provider or what is the name of the office in which the patient attends annual eye exams? Dr.Digby  If pt is not established with a provider, would they like to be referred to a provider to establish care? No .   Dental Screening: Recommended annual dental exams for proper oral hygiene  Community Resource Referral / Chronic Care Management: CRR required this visit?  No   CCM required this visit?  No      Plan:     I have personally reviewed and noted the following in the patient's chart:   . Medical and social history . Use of alcohol, tobacco or illicit drugs  . Current medications and supplements including opioid prescriptions.  . Functional ability and status . Nutritional status . Physical activity . Advanced directives . List of other physicians . Hospitalizations, surgeries, and ER visits in previous 12 months . Vitals . Screenings to include cognitive, depression, and falls . Referrals and appointments  In addition, I have reviewed and discussed with patient certain preventive protocols, quality metrics, and best practice recommendations. A written personalized care plan for preventive services as well as  general preventive health recommendations were provided to patient.     Ofilia Neas, LPN   [id number]/07/4915   Nurse Notes: None

## 2020-10-10 NOTE — Patient Instructions (Signed)
Janet Peterson , Thank you for taking time to come for your Medicare Wellness Visit. I appreciate your ongoing commitment to your health goals. Please review the following plan we discussed and let me know if I can assist you in the future.   Screening recommendations/referrals: Colonoscopy: Up to date, next due 09/28/2029 Mammogram: Currently due, orders placed this visit Bone Density: currently due orders placed this visit Recommended yearly ophthalmology/optometry visit for glaucoma screening and checkup Recommended yearly dental visit for hygiene and checkup  Vaccinations: Influenza vaccine: Up to date, next due fall 2022  Pneumococcal vaccine: Completed series Tdap vaccine: Currently due, you may await and injury to receive  Shingles vaccine: Currently due for Shingrix, if you would like to receive we recommend that you do so at your local pharmacy    Advanced directives: Please bring copies of your advanced medical directives that we may scan them into your chart.   Conditions/risks identified: None   Next appointment: None   Preventive Care 65 Years and Older, Female Preventive care refers to lifestyle choices and visits with your health care provider that can promote health and wellness. What does preventive care include?  A yearly physical exam. This is also called an annual well check.  Dental exams once or twice a year.  Routine eye exams. Ask your health care provider how often you should have your eyes checked.  Personal lifestyle choices, including:  Daily care of your teeth and gums.  Regular physical activity.  Eating a healthy diet.  Avoiding tobacco and drug use.  Limiting alcohol use.  Practicing safe sex.  Taking low-dose aspirin every day.  Taking vitamin and mineral supplements as recommended by your health care provider. What happens during an annual well check? The services and screenings done by your health care provider during your annual well  check will depend on your age, overall health, lifestyle risk factors, and family history of disease. Counseling  Your health care provider may ask you questions about your:  Alcohol use.  Tobacco use.  Drug use.  Emotional well-being.  Home and relationship well-being.  Sexual activity.  Eating habits.  History of falls.  Memory and ability to understand (cognition).  Work and work Statistician.  Reproductive health. Screening  You may have the following tests or measurements:  Height, weight, and BMI.  Blood pressure.  Lipid and cholesterol levels. These may be checked every 5 years, or more frequently if you are over 30 years old.  Skin check.  Lung cancer screening. You may have this screening every year starting at age 43 if you have a 30-pack-year history of smoking and currently smoke or have quit within the past 15 years.  Fecal occult blood test (FOBT) of the stool. You may have this test every year starting at age 38.  Flexible sigmoidoscopy or colonoscopy. You may have a sigmoidoscopy every 5 years or a colonoscopy every 10 years starting at age 75.  Hepatitis C blood test.  Hepatitis B blood test.  Sexually transmitted disease (STD) testing.  Diabetes screening. This is done by checking your blood sugar (glucose) after you have not eaten for a while (fasting). You may have this done every 1-3 years.  Bone density scan. This is done to screen for osteoporosis. You may have this done starting at age 63.  Mammogram. This may be done every 1-2 years. Talk to your health care provider about how often you should have regular mammograms. Talk with your health care provider about  your test results, treatment options, and if necessary, the need for more tests. Vaccines  Your health care provider may recommend certain vaccines, such as:  Influenza vaccine. This is recommended every year.  Tetanus, diphtheria, and acellular pertussis (Tdap, Td) vaccine. You  may need a Td booster every 10 years.  Zoster vaccine. You may need this after age 59.  Pneumococcal 13-valent conjugate (PCV13) vaccine. One dose is recommended after age 66.  Pneumococcal polysaccharide (PPSV23) vaccine. One dose is recommended after age 32. Talk to your health care provider about which screenings and vaccines you need and how often you need them. This information is not intended to replace advice given to you by your health care provider. Make sure you discuss any questions you have with your health care provider. Document Released: 06/07/2015 Document Revised: 01/29/2016 Document Reviewed: 03/12/2015 Elsevier Interactive Patient Education  2017 [place] Prevention in the Home Falls can cause injuries. They can happen to people of all ages. There are many things you can do to make your home safe and to help prevent falls. What can I do on the outside of my home?  Regularly fix the edges of walkways and driveways and fix any cracks.  Remove anything that might make you trip as you walk through a door, such as a raised step or threshold.  Trim any bushes or trees on the path to your home.  Use bright outdoor lighting.  Clear any walking paths of anything that might make someone trip, such as rocks or tools.  Regularly check to see if handrails are loose or broken. Make sure that both sides of any steps have handrails.  Any raised decks and porches should have guardrails on the edges.  Have any leaves, snow, or ice cleared regularly.  Use sand or salt on walking paths during winter.  Clean up any spills in your garage right away. This includes oil or grease spills. What can I do in the bathroom?  Use night lights.  Install grab bars by the toilet and in the tub and shower. Do not use towel bars as grab bars.  Use non-skid mats or decals in the tub or shower.  If you need to sit down in the shower, use a plastic, non-slip stool.  Keep the floor  dry. Clean up any water that spills on the floor as soon as it happens.  Remove soap buildup in the tub or shower regularly.  Attach bath mats securely with double-sided non-slip rug tape.  Do not have throw rugs and other things on the floor that can make you trip. What can I do in the bedroom?  Use night lights.  Make sure that you have a light by your bed that is easy to reach.  Do not use any sheets or blankets that are too big for your bed. They should not hang down onto the floor.  Have a firm chair that has side arms. You can use this for support while you get dressed.  Do not have throw rugs and other things on the floor that can make you trip. What can I do in the kitchen?  Clean up any spills right away.  Avoid walking on wet floors.  Keep items that you use a lot in easy-to-reach places.  If you need to reach something above you, use a strong step stool that has a grab bar.  Keep electrical cords out of the way.  Do not use floor polish or wax  that makes floors slippery. If you must use wax, use non-skid floor wax.  Do not have throw rugs and other things on the floor that can make you trip. What can I do with my stairs?  Do not leave any items on the stairs.  Make sure that there are handrails on both sides of the stairs and use them. Fix handrails that are broken or loose. Make sure that handrails are as long as the stairways.  Check any carpeting to make sure that it is firmly attached to the stairs. Fix any carpet that is loose or worn.  Avoid having throw rugs at the top or bottom of the stairs. If you do have throw rugs, attach them to the floor with carpet tape.  Make sure that you have a light switch at the top of the stairs and the bottom of the stairs. If you do not have them, ask someone to add them for you. What else can I do to help prevent falls?  Wear shoes that:  Do not have high heels.  Have rubber bottoms.  Are comfortable and fit you  well.  Are closed at the toe. Do not wear sandals.  If you use a stepladder:  Make sure that it is fully opened. Do not climb a closed stepladder.  Make sure that both sides of the stepladder are locked into place.  Ask someone to hold it for you, if possible.  Clearly mark and make sure that you can see:  Any grab bars or handrails.  First and last steps.  Where the edge of each step is.  Use tools that help you move around (mobility aids) if they are needed. These include:  Canes.  Walkers.  Scooters.  Crutches.  Turn on the lights when you go into a dark area. Replace any light bulbs as soon as they burn out.  Set up your furniture so you have a clear path. Avoid moving your furniture around.  If any of your floors are uneven, fix them.  If there are any pets around you, be aware of where they are.  Review your medicines with your doctor. Some medicines can make you feel dizzy. This can increase your chance of falling. Ask your doctor what other things that you can do to help prevent falls. This information is not intended to replace advice given to you by your health care provider. Make sure you discuss any questions you have with your health care provider. Document Released: 03/07/2009 Document Revised: 10/17/2015 Document Reviewed: 06/15/2014 Elsevier Interactive Patient Education  2017 [employer].

## 2020-10-17 DIAGNOSIS — N8500 Endometrial hyperplasia, unspecified: Secondary | ICD-10-CM | POA: Diagnosis not present

## 2020-10-17 DIAGNOSIS — R935 Abnormal findings on diagnostic imaging of other abdominal regions, including retroperitoneum: Secondary | ICD-10-CM | POA: Diagnosis not present

## 2020-10-20 ENCOUNTER — Ambulatory Visit
Admission: RE | Admit: 2020-10-20 | Discharge: 2020-10-20 | Disposition: A | Discharge status: Home / Self Care | Payer: Medicare Other | Source: Ambulatory Visit | Attending: Gastroenterology | Admitting: Gastroenterology

## 2020-10-20 ENCOUNTER — Other Ambulatory Visit: Payer: Self-pay | Admitting: Rheumatology

## 2020-10-20 DIAGNOSIS — D3502 Benign neoplasm of left adrenal gland: Secondary | ICD-10-CM | POA: Diagnosis not present

## 2020-10-20 DIAGNOSIS — K769 Liver disease, unspecified: Secondary | ICD-10-CM

## 2020-10-20 MED ORDER — GADOBENATE DIMEGLUMINE 529 MG/ML IV SOLN
18.0000 mL | Freq: Once | INTRAVENOUS | Status: AC | PRN
  Administered 2020-10-20: 18 mL via INTRAVENOUS

## 2020-10-22 NOTE — Telephone Encounter (Signed)
Next Visit: 02/27/2021  Last Visit: 09/26/2020  Last Fill: 04/25/2020  DX: Psoriatic arthritis   Current Dose per office note 09/30/2020  Arava 20 mg 1 tablet by mouth daily  Labs: 09/26/2020 RBC count, hgb, and hct remain borderline low. Glucose is elevated-135. Rest of CMP WNL.   Okay to refill Arava?

## 2020-10-23 DIAGNOSIS — R9389 Abnormal findings on diagnostic imaging of other specified body structures: Secondary | ICD-10-CM | POA: Diagnosis not present

## 2020-10-25 ENCOUNTER — Other Ambulatory Visit: Payer: Self-pay | Admitting: *Deleted

## 2020-10-25 ENCOUNTER — Telehealth: Payer: Self-pay | Admitting: Rheumatology

## 2020-10-25 MED ORDER — OXYCODONE-ACETAMINOPHEN 5-325 MG PO TABS: 1.0000 | ORAL_TABLET | Freq: Every day | ORAL | 0 refills | Status: DC

## 2020-10-25 NOTE — Telephone Encounter (Signed)
Received call from patient.   Requested refill on Oxycodone/APAP.   Ok to refill??  Last office visit 09/24/2020.  Last refill 08/22/2020.

## 2020-10-25 NOTE — Telephone Encounter (Signed)
Patient calling to see if she can get Visco knee injections in right knee again. Please advise.

## 2020-10-25 NOTE — Telephone Encounter (Signed)
Ok to apply for visco gel injections for the right knee.  We can update x-rays at the first injection appt.

## 2020-10-31 NOTE — Telephone Encounter (Signed)
Please call patient to schedule Visco knee injections.  Authorized for Orthovisc series Right Knee. Buy and Bill. Deductible does not apply. Insurance to cover 100% of allowable cost. No copay required.

## 2020-11-01 ENCOUNTER — Telehealth: Payer: Self-pay | Admitting: Diagnostic Neuroimaging

## 2020-11-01 NOTE — Telephone Encounter (Signed)
Pt called, Dr Leta Baptist ask me to called back if I'm still bothered with dyskinesia. Still having movement in my toe, foot and mouth. Would like a call from the nurse.

## 2020-11-04 NOTE — Telephone Encounter (Signed)
Called patient who stated she weaned off Seroquel. She restarted wellbutrin, is on Doxepin per psychiatry to help her sleep at night. She stated she is no better since stopping Seroquel. She would like to try valbenazine. I advised will let Dr Leta Baptist know, on;ly cal her back if he has other recommendations. Patient verbalized understanding, appreciation.

## 2020-11-05 ENCOUNTER — Encounter: Payer: Self-pay | Admitting: Physical Medicine & Rehabilitation

## 2020-11-05 ENCOUNTER — Other Ambulatory Visit: Payer: Self-pay

## 2020-11-05 ENCOUNTER — Encounter: Payer: Medicare Other | Attending: Physical Medicine & Rehabilitation | Admitting: Physical Medicine & Rehabilitation

## 2020-11-05 VITALS — BP 138/89 | HR 93 | Temp 98.7°F | Ht 64.0 in | Wt 192.8 lb

## 2020-11-05 DIAGNOSIS — M961 Postlaminectomy syndrome, not elsewhere classified: Secondary | ICD-10-CM | POA: Insufficient documentation

## 2020-11-05 DIAGNOSIS — M7918 Myalgia, other site: Secondary | ICD-10-CM | POA: Diagnosis not present

## 2020-11-05 DIAGNOSIS — M654 Radial styloid tenosynovitis [de Quervain]: Secondary | ICD-10-CM | POA: Insufficient documentation

## 2020-11-05 NOTE — Progress Notes (Signed)
Subjective:    Patient ID: Janet Peterson, female    DOB: 18-Sep-1950, 70 y.o.   MRN: [id number] female with history of chronic spine pain. Spinous pain started in the neck area. She underwent spinal injections without relief. Then underwent C5-C7 ACDF by Dr. Rennis Harding. Prior to surgery, patient was having shooting pains down into the left hand, however, that improved postoperatively. Patient then developed increasing thoracic pain. She was evaluated by orthopedic spine surgery and diagnosed with thoracic scoliosis. She underwent T4-S 1 fusion With revision surgery. Initial surgery T10-S1, subsequent surgery T4-S1. These were both in 2013. Then, patient had partial removal of one of the rods in 2016 and bone grafting. 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  She was diagnosed with cervical dystonia and was injected with Botox 200U in the following muscle groups left trapezius, left splenis capitis, left semispinalis capitis Left trap 75U Left splenius cap 75U Left semispinalis capitus 50U  Pt had no neck pain since that time although she noted neck weakness for 35mo post injection  The pt did not follow up for repeat injection as her pain has improved  Fall in Jan or Feb , Seen by [facility] in January or Feb, Xrays of Thoracic spine reportedly were ok  Patient continues have pain in the upper back area and is wondering whether Botox would be helpful for this.  She has not had any physical therapy.  She has used some over-the-counter medication.  Patient also has been prescribed Percocet 5 mg as needed  Pain Inventory Average Pain 8 Pain Right Now 8 My pain is intermittent, sharp, burning, stabbing, and aching  In the last 24 hours, has pain interfered with the following? General activity 7 Relation with others 4 Enjoyment of life 5 What TIME of day is your pain at its worst? daytime, evening,  and night Sleep (in general) Fair  Pain is worse with: walking, bending, standing, and some activites Pain improves with: rest, medication, and injections Relief from Meds: 6  walk without assistance how many minutes can you walk? 10 ability to climb steps?  yes do you drive?  yes  not employed: date last employed 2011  weakness trouble walking depression anxiety  Any changes since last visit?  no  Primary care [facility] [place]    Family History  Problem Relation Age of Onset   Diabetes Mother    Heart disease Mother    Diabetes Father    Anuerysm Father    Diabetes Brother    Heart disease Brother    Diabetes Brother    Heart disease Brother    Social History   Socioeconomic History   Marital status: Married    Spouse name: Daryl   Number of children: Not on file   Years of education: Not on file   Highest education level: Not on file  Occupational History    Comment: retired  Tobacco Use   Smoking status: Former    Packs/day: 0.50    Years: 10.00    Pack years: 5.00    Types: Cigarettes    Quit date: 03/07/1995    Years since quitting: 25.6   Smokeless tobacco: Never  Vaping Use   Vaping Use: Never used  Substance and Sexual Activity   Alcohol use: Yes    Comment: rare   Drug use:  Never   Sexual activity: Not on file  Other Topics Concern   Not on file  Social History Narrative   Lives with husband   Social Determinants of Health   Financial Resource Strain: Low Risk    Difficulty of Paying Living Expenses: Not hard at all  Food Insecurity: No Food Insecurity   Worried About Charity fundraiser in the Last Year: Never true   Arboriculturist in the Last Year: Never true  Transportation Needs: No Transportation Needs   Lack of Transportation (Medical): No   Lack of Transportation (Non-Medical): No  Physical Activity: Inactive   Days of Exercise per Week: 0 days   Minutes of Exercise per  Session: 0 min  Stress: No Stress Concern Present   Feeling of Stress : Not at all  Social Connections: Moderately Isolated   Frequency of Communication with Friends and Family: More than three times a week   Frequency of Social Gatherings with Friends and Family: Once a week   Attends Religious Services: Never   Marine scientist or Organizations: No   Attends Archivist Meetings: Never   Marital Status: Married   Past Surgical History:  Procedure Laterality Date   BIOPSY  09/29/2019   Procedure: BIOPSY;  Surgeon: Ronnette Juniper, MD;  Location: [facility];  Service: Gastroenterology;;   BREAST LUMPECTOMY WITH RADIOACTIVE SEED LOCALIZATION Left 05/04/2018   Procedure: LEFT BREAST LUMPECTOMY WITH RADIOACTIVE SEED LOCALIZATION AND LEFT BREAST NIPPLE BIOPSY;  Surgeon: Jovita Kussmaul, MD;  Location: [place];  Service: General;  Laterality: Left;   BREAST SURGERY  1975   lumpectomy-- benign   BUNIONECTOMY  1991   CATARACT EXTRACTION W/ INTRAOCULAR LENS  IMPLANT, BILATERAL  2000   CERVICAL FUSION  March 2013   C5 -- C7   CESAREAN SECTION  1985   COLONOSCOPY WITH PROPOFOL N/A 09/29/2019   Procedure: COLONOSCOPY WITH PROPOFOL;  Surgeon: Ronnette Juniper, MD;  Location: [facility];  Service: Gastroenterology;  Laterality: N/A;   DISTAL INTERPHALANGEAL JOINT FUSION Right 03/14/2015   Procedure: RIGHT LONG FINGER DISTAL INTERPHALANGEAL JOINT ARTHRODESIS;  Surgeon: Iran Planas, MD;  Location: [facility];  Service: Orthopedics;  Laterality: Right;   ESOPHAGOGASTRODUODENOSCOPY (EGD) WITH PROPOFOL N/A 09/29/2019   Procedure: ESOPHAGOGASTRODUODENOSCOPY (EGD) WITH PROPOFOL;  Surgeon: Ronnette Juniper, MD;  Location: [facility];  Service: Gastroenterology;  Laterality: N/A;   EYE SURGERY  1995   rk (laser surgery), semi cornea transplant, detacted retina,  fluid removal   KNEE ARTHROSCOPY Left 03-03-2004   POLYPECTOMY  09/29/2019   Procedure: POLYPECTOMY;  Surgeon:  Ronnette Juniper, MD;  Location: [facility];  Service: Gastroenterology;;   POSTERIOR VITRECTOMY RIGHT EYE AND LASER   10-27-1999   SHOULDER SURGERY Right 1997   SPINAL FIXATION SURGERY W/ IMPLANT  2013 rod #1//  2014  rod 2   S1 -- T10  (rod #1)//   S1 -- T4 (rod #2)   THORACIC FUSION  03-13-2013   REMOVAL HARDWARE/  BONE GRAFT FUSION T10//  REVISION OF RODS   TOTAL KNEE ARTHROPLASTY Left 12-14-2005   UVULOPALATOPHARYNGOPLASTY  04-26-2006   w/  TONSILLECTOMY/  TURBINATE REDUCTIONS/  BILATERAL ANTERIOR ETHMOIDECTOMY   Past Medical History:  Diagnosis Date   Ankylosing spondylitis ([place])    Anxiety and depression    Depression    Fibromyalgia    GERD (gastroesophageal reflux disease)    Hiatal hernia    per patient, dx by GI  History of basal cell carcinoma excision    FACE, 1992  &  1996   History of hiatal hernia    History of thrush    Hyperlipidemia    Hypertension    Insomnia    Left breast mass    OCD (obsessive compulsive disorder)    OSA (obstructive sleep apnea)    MILD AND NO CPAP SINCE SURGERY IN 2007   Osteopenia    PONV (postoperative nausea and vomiting)    Psoriatic arthritis (HCC)    PVC (premature ventricular contraction)    Rheumatoid arthritis (HCC)    BP 138/89   Pulse 93   Temp 98.7 F (37.1 C)   Ht 5\' 4"  (1.626 m)   Wt 192 lb 12.8 oz (87.5 kg)   BMI 33.09 kg/m   Opioid Risk Score:   Fall Risk Score:  `1  Depression screen PHQ 2/9  Depression screen [facility] 2/9 11/05/2020 10/10/2020 09/24/2020 06/25/2020 05/10/2017 03/09/2017 09/10/2016  Decreased Interest 1 0 0 0 0 2 2  Down, Depressed, Hopeless 1 0 0 0 0 1 2  PHQ - 2 Score 2 0 0 0 0 3 4  Altered sleeping 1 0 0 - - 1 1  Tired, decreased energy 0 0 0 - - 2 3  Change in appetite 0 0 0 - - 0 2  Feeling bad or failure about yourself  0 0 0 - - 0 0  Trouble concentrating 0 0 0 - - 1 2  Moving slowly or fidgety/restless 0 0 0 - - 1 1  Suicidal thoughts 0 0 0 - - 0 0  PHQ-9 Score 3 0 0 - - 8 13   Difficult doing work/chores Not difficult at all Not difficult at all Not difficult at all - - Somewhat difficult Somewhat difficult  Some recent data might be hidden    Review of Systems  Constitutional:  Positive for appetite change and unexpected weight change.       Wt loss  HENT: Negative.    Eyes: Negative.   Respiratory:  Positive for shortness of breath.   Cardiovascular: Negative.   Gastrointestinal:  Positive for abdominal pain, constipation and vomiting.  Endocrine:       High blood sugar  Genitourinary: Negative.   Musculoskeletal:  Positive for back pain and gait problem.  Skin: Negative.   Allergic/Immunologic: Negative.   Neurological:  Positive for weakness.  Hematological: Negative.   Psychiatric/Behavioral:  Positive for dysphoric mood. The patient is nervous/anxious.   All other systems reviewed and are negative.     Objective:   Physical Exam Vitals and nursing note reviewed.  Constitutional:      Appearance: She is obese.  HENT:     Head: Normocephalic and atraumatic.  Eyes:     Extraocular Movements: Extraocular movements intact.     Conjunctiva/sclera: Conjunctivae normal.     Pupils: Pupils are equal, round, and reactive to light.  Cardiovascular:     Rate and Rhythm: Normal rate and regular rhythm.     Heart sounds: Normal heart sounds. No murmur heard. Pulmonary:     Effort: Pulmonary effort is normal. No respiratory distress.     Breath sounds: Normal breath sounds.  Abdominal:     General: Abdomen is flat. There is no distension.     Palpations: Abdomen is soft.  Musculoskeletal:        General: No swelling.     Right lower leg: No edema.     Left  lower leg: No edema.     Comments: Mild kyphosis no evidence of significant scoliosis there is extensive scarring midline thoracic and lumbar spine.  No tenderness to palpation along the thoracic or lumbar spine there is tenderness along the medial scapular area as well as infraspinatus fossa.  No  tenderness along the cervical spine she has good cervical range of motion without pain no evidence of abnormal head positioning.  Neurological:     Mental Status: She is alert and oriented to person, place, and time.     Cranial Nerves: No dysarthria.     Sensory: Sensation is intact.     Gait: Gait normal.     Comments: Motor strength is 5/5 bilateral deltoid, bicep, tricep, grip, hip flexion, knee extensor, ankle dorsiflexor and plantar flexor          Assessment & Plan:  1.  Thoracic and lumbar postlaminectomy syndrome currently not having any axial spine pain 2.  Myofascial pain involving rhomboids infraspinatus as well as mid and lower trap.  Recommend physical therapy have made referral, trial of dry needling as well as strengthening and stretching We will do trigger point injections today Follow-up with me in 2 months   Trigger Point Injection Bilateral middle trap, Rhomboid major, infraspinatus   Indication: Bilateral periscapular  Myofascial pain not relieved by medication management and other conservative care.  Informed consent was obtained after describing risk and benefits of the procedure with the patient, this includes bleeding, bruising, infection and medication side effects.  The patient wishes to proceed and has given written consent.  The patient was placed in a seated position.  The infraspinatus fossa and medial scapular border area was marked bilaterally and prepped with Betadine.  It was entered with a 25-gauge 1-1/2 inch needle and 1 mL of 1% lidocaine was injected into each of 8 trigger points, after negative draw back for blood.  The patient tolerated the procedure well.  Post procedure instructions were given.

## 2020-11-05 NOTE — Patient Instructions (Signed)
Trigger Point Injection Trigger points are areas where you have pain. A trigger point injection is a shot given in the trigger point to help relieve pain for a few days to a few months. Common places for trigger points include: The neck. The shoulders. The upper back. The lower back. A trigger point injection will not cure long-term (chronic) pain permanently. These injections do not always work for every person. Forsome people, they can help to relieve pain for a few days to a few months. Tell a health care provider about: Any allergies you have. All medicines you are taking, including vitamins, herbs, eye drops, creams, and over-the-counter medicines. Any problems you or family members have had with anesthetic medicines. Any blood disorders you have. Any surgeries you have had. Any medical conditions you have. What are the risks? Generally, this is a safe procedure. However, problems may occur, including: Infection. Bleeding or bruising. Allergic reaction to the injected medicine. Irritation of the skin around the injection site. What happens before the procedure? Ask your health care provider about: Changing or stopping your regular medicines. This is especially important if you are taking diabetes medicines or blood thinners. Taking medicines such as aspirin and ibuprofen. These medicines can thin your blood. Do not take these medicines unless your health care provider tells you to take them. Taking over-the-counter medicines, vitamins, herbs, and supplements. What happens during the procedure?  Your health care provider will feel for trigger points. A marker may be used to circle the area for the injection. The skin over the trigger point will be washed with a germ-killing (antiseptic) solution. A thin needle is used for the injection. You may feel pain or a twitching feeling when the needle enters the trigger point. A numbing solution may be injected into the trigger point.  Sometimes a medicine to keep down inflammation is also injected. Your health care provider may move the needle around the area where the trigger point is located until the tightness and twitching goes away. After the injection, your health care provider may put gentle pressure over the injection site. The injection site will be covered with a bandage (dressing). The procedure may vary among health care providers and hospitals. What can I expect after treatment? After treatment, you may have: Soreness and stiffness for 1-2 days. A dressing. This can be taken off in a few hours or as told by your health care provider. Follow these instructions at home: Injection site care Remove your dressing as told by your health care provider. Check your injection site every day for signs of infection. Check for: Redness, swelling, or pain. Fluid or blood. Warmth. Pus or a bad smell. Managing pain, stiffness, and swelling If directed, put ice on the affected area. Put ice in a plastic bag. Place a towel between your skin and the bag. Leave the ice on for 20 minutes, 2-3 times a day. General instructions If you were asked to stop your regular medicines, ask your health care provider when you may start taking them again. Return to your normal activities as told by your health care provider. Ask your health care provider what activities are safe for you. Do not take baths, swim, or use a hot tub until your health care provider approves. You may be asked to see an occupational or physical therapist for exercises that reduce muscle strain and stretch the area of the trigger point. Keep all follow-up visits as told by your health care provider. This is important. Contact a  health care provider if: Your pain comes back, and it is worse than before the injection. You may need more injections. You have chills or a fever. The injection site becomes more painful, red, swollen, or warm to the touch. Summary A  trigger point injection is a shot given in the trigger point to help relieve pain for a few days to a few months. Common places for trigger point injections are the neck, shoulder, upper back, and lower back. These injections do not always work for every person, but for some people, the injections can help to relieve pain for a few days to a few months. Contact a health care provider if symptoms come back or they are worse than before treatment. Also, get help if the injection site becomes more painful, red, swollen, or warm to the touch. This information is not intended to replace advice given to you by your health care provider. Make sure you discuss any questions you have with your healthcare provider. Document Revised: 06/22/2018 Document Reviewed: 06/22/2018 Elsevier Patient Education  Janet Peterson.

## 2020-11-05 NOTE — Telephone Encounter (Signed)
Patient came into office; [employer] (valbenazine ) PAP form filled out. On MD desk for completion, signature when he returns Monday. Patient stated she will be on vacation next week, had no questions.

## 2020-11-05 NOTE — Telephone Encounter (Signed)
Valbenazine trial approved by Dr Leta Baptist. Called patient and informed her. I reviewed possible allergic reactions and side effects with her, advised she must sign a start form. She will come by today to sign understands MD is out of office until next Mon. Patient  had no questions, verbalized understanding, appreciation.

## 2020-11-12 ENCOUNTER — Telehealth: Payer: Self-pay | Admitting: Diagnostic Neuroimaging

## 2020-11-12 NOTE — Telephone Encounter (Signed)
[employer] [place]) called, we received patient assist application faxed yesterday. Want to go over requirements for patient assistance program with you  Contact info: [phone]

## 2020-11-13 NOTE — Telephone Encounter (Signed)
Called ingrezza support, spoke with Caryl Pina who stated insurance requires PA. They need PA, appeal and denial first before processing for PAP. Team will send to specialty pharmacy, I will receive fax from pharmacy who will do PA, appeal and denial. [employer] will update [place] on PA, PAP.

## 2020-11-18 ENCOUNTER — Other Ambulatory Visit: Payer: Self-pay | Admitting: Family Medicine

## 2020-11-18 ENCOUNTER — Telehealth: Payer: Self-pay | Admitting: *Deleted

## 2020-11-18 NOTE — Telephone Encounter (Signed)
Received message from Brett Canales with [employer]: Can you please call me at [phone] regarding Dr. Tally Joe case? Called Mr Coralyn Mark who was checking on status of Ingrezza PA. I advise dit was approved. He stated [facility] will call patient to scheduled shipment. I thanked him, advised I have called patient. verbalized understanding, appreciation.'

## 2020-11-18 NOTE — Telephone Encounter (Signed)
Ingrezza PA, key: [id number], G24.01 tardive dyskinesia. Office notes faxed to [facility]. Received confirmation.

## 2020-11-18 NOTE — Telephone Encounter (Signed)
Ok to refill 

## 2020-11-18 NOTE — Telephone Encounter (Signed)
[facility]  specialty pharmacy PA dept, spoke with Lattie Haw and informed her that [employer], medicare part D approved ingrezza 05/25/20 - 11/18/2021. She stated they will get Rx ready to ship. Called patient to inform.

## 2020-11-18 NOTE — Telephone Encounter (Signed)
[facility] Drummer) called, following up  on PA for Ingrezza. Insurance waiting for clinical questions, which will expire today.   Contact info: [phone]

## 2020-11-19 ENCOUNTER — Ambulatory Visit: Payer: Medicare Other | Attending: Physical Medicine & Rehabilitation

## 2020-11-19 ENCOUNTER — Other Ambulatory Visit: Payer: Self-pay

## 2020-11-19 DIAGNOSIS — R293 Abnormal posture: Secondary | ICD-10-CM | POA: Diagnosis not present

## 2020-11-19 DIAGNOSIS — M546 Pain in thoracic spine: Secondary | ICD-10-CM | POA: Insufficient documentation

## 2020-11-19 DIAGNOSIS — M6281 Muscle weakness (generalized): Secondary | ICD-10-CM | POA: Diagnosis not present

## 2020-11-19 NOTE — Telephone Encounter (Signed)
Received message to call Roderic Palau. Called and he  stated patient has a  co pay due to being in donut hole.  [facility] is trying to reach her to discuss. They ask her to call them at [phone]. I called patient, LVM and advised her to call pharmacy.

## 2020-11-19 NOTE — Patient Instructions (Signed)
Access Code: [email or web address] URL: [email or web address] Date: 11/19/2020 Prepared by: Rich Number  Exercises Standing Cervical Retraction - 2 x daily - 7 x weekly - 1 sets - 15 reps - 3 hold Shoulder External Rotation and Scapular Retraction - 2 x daily - 7 x weekly - 1 sets - 15 reps - 5 hold Doorway Pec Stretch at 60 Elevation - 2 x daily - 7 x weekly - 1 sets - 3 reps - 20 hold

## 2020-11-19 NOTE — Therapy (Addendum)
[facility] [place], [place], [address] Phone: [phone]   Fax:  [phone]  Physical Therapy Evaluation  Patient Details  Name: Janet Peterson MRN: [id number] Date of Birth: 1950/07/16 Referring Provider (PT): Charlett Blake, MD   Encounter Date: 11/19/2020   PT End of Session - 11/19/20 1519     Visit Number 1    Number of Visits 16    Date for PT Re-Evaluation 01/14/21    Authorization Type MCR    Authorization Time Period 6th visit FOTO, 10th visit FOTO / re-eval    PT Start Time 1410    PT Stop Time 1510    PT Time Calculation (min) 60 min    Activity Tolerance Patient tolerated treatment well    Behavior During Therapy WFL for tasks assessed/performed             Past Medical History:  Diagnosis Date   Ankylosing spondylitis ([place])    Anxiety and depression    Depression    Fibromyalgia    GERD (gastroesophageal reflux disease)    Hiatal hernia    per patient, dx by GI    History of basal cell carcinoma excision    [place]   History of hiatal hernia    History of thrush    Hyperlipidemia    Hypertension    Insomnia    Left breast mass    OCD (obsessive compulsive disorder)    OSA (obstructive sleep apnea)    MILD AND NO CPAP SINCE SURGERY IN 2007   Osteopenia    PONV (postoperative nausea and vomiting)    Psoriatic arthritis ([place])    PVC (premature ventricular contraction)    Rheumatoid arthritis ([place])     Past Surgical History:  Procedure Laterality Date   BIOPSY  09/29/2019   Procedure: BIOPSY;  Surgeon: Ronnette Juniper, MD;  Location: [facility];  Service: Gastroenterology;;   BREAST LUMPECTOMY WITH RADIOACTIVE SEED LOCALIZATION Left 05/04/2018   Procedure: LEFT BREAST LUMPECTOMY WITH RADIOACTIVE SEED LOCALIZATION AND LEFT BREAST NIPPLE BIOPSY;  Surgeon: Jovita Kussmaul, MD;  Location: [facility];  Service: General;  Laterality: Left;   BREAST SURGERY   1975   lumpectomy-- benign   BUNIONECTOMY  1991   CATARACT EXTRACTION W/ INTRAOCULAR LENS  IMPLANT, BILATERAL  2000   CERVICAL FUSION  March 2013   C5 -- C7   CESAREAN SECTION  1985   COLONOSCOPY WITH PROPOFOL N/A 09/29/2019   Procedure: COLONOSCOPY WITH PROPOFOL;  Surgeon: Ronnette Juniper, MD;  Location: [facility];  Service: Gastroenterology;  Laterality: N/A;   DISTAL INTERPHALANGEAL JOINT FUSION Right 03/14/2015   Procedure: RIGHT LONG FINGER DISTAL INTERPHALANGEAL JOINT ARTHRODESIS;  Surgeon: Iran Planas, MD;  Location: [facility];  Service: Orthopedics;  Laterality: Right;   ESOPHAGOGASTRODUODENOSCOPY (EGD) WITH PROPOFOL N/A 09/29/2019   Procedure: ESOPHAGOGASTRODUODENOSCOPY (EGD) WITH PROPOFOL;  Surgeon: Ronnette Juniper, MD;  Location: [facility];  Service: Gastroenterology;  Laterality: N/A;   EYE SURGERY  1995   rk (laser surgery), semi cornea transplant, detacted retina,  fluid removal   KNEE ARTHROSCOPY Left 03-03-2004   POLYPECTOMY  09/29/2019   Procedure: POLYPECTOMY;  Surgeon: Ronnette Juniper, MD;  Location: [facility];  Service: Gastroenterology;;   POSTERIOR VITRECTOMY RIGHT EYE AND LASER   10-27-1999   SHOULDER SURGERY Right [place] W/ IMPLANT  2013 rod #1//  2014  rod 2   S1 --  T10  (rod #1)//   S1 -- T4 (rod #2)   THORACIC FUSION  03-13-2013   REMOVAL HARDWARE/  BONE GRAFT FUSION T10//  REVISION OF RODS   TOTAL KNEE ARTHROPLASTY Left 12-14-2005   UVULOPALATOPHARYNGOPLASTY  04-26-2006   w/  TONSILLECTOMY/  TURBINATE REDUCTIONS/  BILATERAL ANTERIOR ETHMOIDECTOMY    There were no vitals filed for this visit.    Subjective Assessment - 11/19/20 1407     Subjective Faria reports that she had fusion in the cervical spine in 2013 and fusion of T4 to L2 in 2015.  The patient states that she has some Scoliosis going on back there.  Hildegarde reports that this past year she started to have mid back pain.  She had been having scapular pain prior to  that.  She reports that there is a grabbing sensation in the left shoulder blade.  It will come on when she is reaching for laundry, with walking, or standing at the sink in the kitchen.  She reports that she has to lay down for it to calm down.  She gets up about 4 times a night due to pain waking her up.  She denies numbness and tingling sensations.  Jerlisa reports that she got injections into the thoracic spine and that helped for only 3 days.    Limitations Standing;Walking;Lifting;House hold activities;Sitting    How long can you sit comfortably? 20 minutes    How long can you stand comfortably? 5 minutes    How long can you walk comfortably? 15 minutes    Patient Stated Goals To be able to walk more and get rid of the pain.    Currently in Pain? Yes    Pain Score 6    worse: 9/10 with doing laundry and dishes   Pain Location Scapula    Pain Orientation Right;Left    Pain Descriptors / Indicators Dull;Sharp    Pain Onset More than a month ago    Aggravating Factors  dishes, laundry, walking, playing cards or board games.    Pain Relieving Factors laying down and medication                [facility] PT Assessment - 11/19/20 0001       Assessment   Medical Diagnosis M79.18 (ICD-10-CM) - Myofascial pain syndrome of thoracic spine    Referring Provider (PT) Kirsteins, Luanna Salk, MD    Onset Date/Surgical Date --   over the past year   Hand Dominance Right    Next MD Visit 12/17/20    Prior Therapy Yes - pre/post spinal surgeries      Precautions   Precaution Comments Latex Allergy      Restrictions   Weight Bearing Restrictions No      Balance Screen   Has the patient fallen in the past 6 months No    Has the patient had a decrease in activity level because of a fear of falling?  No    Is the patient reluctant to leave their home because of a fear of falling?  No      Home Environment   Living Environment Private residence    Living Arrangements Spouse/significant other    Type  of [place] to enter    Entrance Stairs-Number of Steps [place] One level    [place] - 2 wheels;Walker - standard;Cane - single point  Prior Function   Level of Independence Independent with basic ADLs    Vocation Retired    Leisure walk, playing cards, board games      Cognition   Overall Cognitive Status Within Functional Limits for tasks assessed      Observation/Other Assessments   Focus on Therapeutic Outcomes (FOTO)  Back 36% ; Predicted 44%      Posture/Postural Control   Posture/Postural Control Postural limitations    Postural Limitations Rounded Shoulders;Forward head;Increased thoracic kyphosis      AROM   Overall AROM Comments Excessive UT activation during shoulder AROM    Right Shoulder Flexion 115 Degrees    Right Shoulder ABduction 95 Degrees    Right Shoulder Internal Rotation --   L1   Right Shoulder External Rotation --   T2   Left Shoulder Flexion 110 Degrees    Left Shoulder ABduction 90 Degrees    Left Shoulder Internal Rotation --   L1   Left Shoulder External Rotation --   T1   Cervical Flexion 55    Cervical Extension 20    Cervical - Right Side Bend 15    Cervical - Left Side Bend 20    Cervical - Right Rotation 30    Cervical - Left Rotation 50      Strength   Overall Strength Comments Excessive UT activation during shoulder MMT    Right Shoulder Flexion 4-/5    Right Shoulder Extension 4+/5    Right Shoulder ABduction 4/5    Right Shoulder Internal Rotation 4+/5    Right Shoulder External Rotation 4+/5    Left Shoulder Flexion 4-/5    Left Shoulder Extension 4+/5    Left Shoulder ABduction 4-/5    Left Shoulder Internal Rotation 4+/5    Left Shoulder External Rotation 4/5                        Objective measurements completed on examination: See above findings.       [facility] Adult PT Treatment/Exercise - 11/19/20 0001       Neck  Exercises: Seated   Neck Retraction 15 reps;3 secs      Shoulder Exercises: Seated   External Rotation AROM;15 reps    External Rotation Limitations Bilaterally with scapular retraction      Shoulder Exercises: Stretch   Corner Stretch 20 seconds;3 reps    Corner Stretch Limitations mid pectoralis      Manual Therapy   Manual therapy comments Manual palpation and assessment during [facility]              Trigger Point Dry Needling - 11/19/20 0001     Consent Given? Yes    Other Dry Needling Patient educate on FDN benefits and risks prior to giving consent    Upper Trapezius Response Twitch reponse elicited;Palpable increased muscle length    Rhomboids Response Palpable increased muscle length    Lower trapezius Response Palpable increased muscle length    Middle trapezius Response Palpable increased muscle length                  PT Education - 11/19/20 1518     Education Details POC, FDN benefits and risks, HEP, FOTO score    Person(s) Educated Patient    Methods Explanation;Handout    Comprehension Verbalized understanding              PT Short Term Goals - 11/19/20 1432  PT SHORT TERM GOAL #1   Title The patient will be independent with a basic HEP for the thoracic / scapular pain.    Baseline no HEP    Time 3    Period Weeks    Target Date 12/10/20               PT Long Term Goals - 11/19/20 1433       PT LONG TERM GOAL #1   Title The patient will be able to sit for 30-45 minutes playing cards with pain < or equal to 4/10.    Baseline 20 minutes    Time 8    Period Weeks    Target Date 01/14/21      PT LONG TERM GOAL #2   Title The patient will be able to walk for 20-30 minutes for grocery shopping with pain < or equal to 4/10.    Baseline 15 minutes    Time 8    Period Weeks    Target Date 01/14/21      PT LONG TERM GOAL #3   Title The patient have an improved FOTO score to 44% for functional home activities.    Baseline 36%     Time 8    Period Weeks    Target Date 01/14/21      PT LONG TERM GOAL #4   Title The patient will present with improvement of bilateral shoulder flexion AROM to 120 - 125 degrees for functional reaching.    Baseline 110 to 115 degrees    Time 8    Period Weeks    Target Date 01/14/21                    Plan - 11/19/20 1429     Clinical Impression Statement Neshia is a 70 y/o female who has complaints of mid thoracic and scapular pain bilaterally.  She reports a gradual onset over the last year.  The patient has had a fusion in the cervical spine in 2013 and T4 to L2 fusion in 2015.  The patient underlying comorbidity of fibromyalgia and psoratic arthritis.  The patient presents with increase joint stiffness at T3-C7.  She sits with a rounded shoulder posture and forward head.  The patient reports being limimited with repetitive reaching activities, including laundry and dishes.  She will have 9/10 pain onset.  She is limited with sitting to 20 minutes and walking to 15 minutes.  Recommend physical therapy for improved of posture, postural strengthening, and reduction of pain with ADLs.    Personal Factors and Comorbidities Comorbidity 3+    Comorbidities Psoriatic arthritis, OCD, DDD lumbar and cervical (fusion T4-L2), PVC, fibromyalgia, restless leg syndrome    Examination-Activity Limitations Bathing;Bed Mobility;Locomotion Level;Transfers;Reach Overhead;Dressing;Lift;Stand;Sleep;Carry;Caring for Others;Sit    Examination-Participation Restrictions Cleaning;Meal Prep;Driving;Shop;Laundry;Yard Work    [employer] Evolving/Moderate complexity    Clinical Decision Making Moderate    Rehab Potential Good    PT Frequency 2x / week    PT Duration 8 weeks    PT Treatment/Interventions ADLs/Self Care Home Management;Electrical Stimulation;Gait training;Stair training;Functional mobility training;Therapeutic activities;Therapeutic exercise;Neuromuscular  re-education;Manual techniques;Balance training;Patient/family education;Scar mobilization;Passive range of motion;Spinal Manipulations    PT Next Visit Plan scapular/rhomboid stretches, reduce UT facilitation with scapular and shoulder movements, scapular training, postural training/strengthening, cervical stabilization.    PT Home Exercise Plan Access Code: [email or web address]    Consulted and Agree with Plan of Care Patient  Patient will benefit from skilled therapeutic intervention in order to improve the following deficits and impairments:  Decreased range of motion, Difficulty walking, Decreased mobility, Decreased strength, Hypomobility, Improper body mechanics, Decreased activity tolerance, Pain  Visit Diagnosis: Pain in thoracic spine - Plan: PT plan of care cert/re-cert  Muscle weakness (generalized) - Plan: PT plan of care cert/re-cert  Abnormal posture - Plan: PT plan of care cert/re-cert     Problem List Patient Active Problem List   Diagnosis Date Noted   C. difficile colitis 09/29/2019   Colitis 09/28/2019   Anxiety and depression 09/28/2019   DDD (degenerative disc disease), cervical s/p fusion 11/12/2016   H/O total knee replacement, left 05/06/2016   DDD lumbar spine status post fusion 05/06/2016   Psoriasis 05/05/2016   High risk medication use 05/05/2016   Cervical post-laminectomy syndrome 12/09/2015   Thoracic postlaminectomy syndrome 12/09/2015   Psoriatic arthritis ([place]) 08/23/2012   Osteopenia 08/23/2012   HLD (hyperlipidemia) 08/23/2012   RLS (restless legs syndrome) 08/23/2012   Insomnia 08/23/2012   Fibromyalgia syndrome 08/12/2012   Low back pain 08/12/2012   Syncope    PVC (premature ventricular contraction)    OCD (obsessive compulsive disorder)    GERD (gastroesophageal reflux disease)    Hypertension    Rich Number, PT, DPT, OCS, Crt. DN  Bethena Midget 11/19/2020, 4:15 PM  [facility] [address] [place], [place], [address] Phone: [phone]   Fax:  [phone]  Name: MEG NIEMEIER MRN: [id number] Date of Birth: 1951/04/05

## 2020-11-20 ENCOUNTER — Ambulatory Visit (INDEPENDENT_AMBULATORY_CARE_PROVIDER_SITE_OTHER): Payer: Medicare Other

## 2020-11-20 ENCOUNTER — Ambulatory Visit (INDEPENDENT_AMBULATORY_CARE_PROVIDER_SITE_OTHER): Payer: Medicare Other | Admitting: Physician Assistant

## 2020-11-20 DIAGNOSIS — M1711 Unilateral primary osteoarthritis, right knee: Secondary | ICD-10-CM

## 2020-11-20 MED ORDER — HYALURONAN 30 MG/2ML IX SOSY
30.0000 mg | PREFILLED_SYRINGE | INTRA_ARTICULAR | Status: AC | PRN
  Administered 2020-11-20: 30 mg via INTRA_ARTICULAR

## 2020-11-20 MED ORDER — LIDOCAINE HCL 1 % IJ SOLN
1.5000 mL | INTRAMUSCULAR | Status: AC | PRN
  Administered 2020-11-20: 1.5 mL

## 2020-11-20 NOTE — Progress Notes (Signed)
   Procedure Note  Patient: Janet Peterson             Date of Birth: 06-Oct-1950           MRN: [id number]             Visit Date: 11/20/2020  Procedures: Visit Diagnoses:  1. Primary osteoarthritis of right knee    Orthovisc #1 Right knee joint injection  Large Joint Inj: R knee on 11/20/2020 8:02 AM Indications: pain Details: 25 G 1.5 in needle, medial approach  Arthrogram: No  Medications: 1.5 mL lidocaine 1 %; 30 mg Hyaluronan 30 MG/2ML Aspirate: 0 mL Outcome: tolerated well, no immediate complications Procedure, treatment alternatives, risks and benefits explained, specific risks discussed. Consent was given by the patient. Immediately prior to procedure a time out was called to verify the correct patient, procedure, equipment, support staff and site/side marked as required. Patient was prepped and draped in the usual sterile fashion.    Patient tolerated the procedure well.  Aftercare was discussed.  Hazel Sams, PA-C

## 2020-11-22 ENCOUNTER — Other Ambulatory Visit: Payer: Self-pay

## 2020-11-22 ENCOUNTER — Ambulatory Visit: Payer: Medicare Other

## 2020-11-22 DIAGNOSIS — R293 Abnormal posture: Secondary | ICD-10-CM

## 2020-11-22 DIAGNOSIS — M546 Pain in thoracic spine: Secondary | ICD-10-CM | POA: Insufficient documentation

## 2020-11-22 DIAGNOSIS — M6281 Muscle weakness (generalized): Secondary | ICD-10-CM

## 2020-11-22 DIAGNOSIS — M654 Radial styloid tenosynovitis [de Quervain]: Secondary | ICD-10-CM | POA: Diagnosis not present

## 2020-11-22 NOTE — Patient Instructions (Signed)
   [id number]

## 2020-11-22 NOTE — Therapy (Signed)
[place], [place], [address] Phone: [phone]   Fax:  [phone]  Physical Therapy Treatment  Patient Details  Name: DONYELL CARRELL MRN: [id number] Date of Birth: [birth year] Referring Provider (PT): Charlett Blake, MD   Encounter Date: 11/22/2020   PT End of Session - 11/22/20 1453     Visit Number 2    Number of Visits 16    Date for PT Re-Evaluation 01/14/21    Authorization Type MCR    Authorization Time Period 6th visit FOTO, 10th visit FOTO / re-eval    PT Start Time 1315    PT Stop Time 1400    PT Time Calculation (min) 45 min    Activity Tolerance Patient tolerated treatment well    Behavior During Therapy WFL for tasks assessed/performed             Past Medical History:  Diagnosis Date   Ankylosing spondylitis ([place])    Anxiety and depression    Depression    Fibromyalgia    GERD (gastroesophageal reflux disease)    Hiatal hernia    per patient, dx by GI    History of basal cell carcinoma excision    FACE, 1992  &  1996   History of hiatal hernia    History of thrush    Hyperlipidemia    Hypertension    Insomnia    Left breast mass    OCD (obsessive compulsive disorder)    OSA (obstructive sleep apnea)    MILD AND NO CPAP SINCE SURGERY IN 2007   Osteopenia    PONV (postoperative nausea and vomiting)    Psoriatic arthritis ([place])    PVC (premature ventricular contraction)    Rheumatoid arthritis ([place])     Past Surgical History:  Procedure Laterality Date   BIOPSY  09/29/2019   Procedure: BIOPSY;  Surgeon: Ronnette Juniper, MD;  [facility];  Service: Gastroenterology;;   BREAST LUMPECTOMY WITH RADIOACTIVE SEED LOCALIZATION Left 05/04/2018   Procedure: LEFT BREAST LUMPECTOMY WITH RADIOACTIVE SEED LOCALIZATION AND LEFT BREAST NIPPLE BIOPSY;  Surgeon: Jovita Kussmaul, MD;  Location: [facility];  Service: General;  Laterality: Left;   BREAST SURGERY   1975   lumpectomy-- benign   BUNIONECTOMY  1991   CATARACT EXTRACTION W/ INTRAOCULAR LENS  IMPLANT, BILATERAL  2000   CERVICAL FUSION  March 2013   C5 -- C7   CESAREAN SECTION  1985   COLONOSCOPY WITH PROPOFOL N/A 09/29/2019   Procedure: COLONOSCOPY WITH PROPOFOL;  Surgeon: Ronnette Juniper, MD;  [facility];  Service: Gastroenterology;  Laterality: N/A;   DISTAL INTERPHALANGEAL JOINT FUSION Right 03/14/2015   Procedure: RIGHT LONG FINGER DISTAL INTERPHALANGEAL JOINT ARTHRODESIS;  Surgeon: Iran Planas, MD;  Location: [facility];  Service: Orthopedics;  Laterality: Right;   ESOPHAGOGASTRODUODENOSCOPY (EGD) WITH PROPOFOL N/A 09/29/2019   Procedure: ESOPHAGOGASTRODUODENOSCOPY (EGD) WITH PROPOFOL;  Surgeon: Ronnette Juniper, MD;  [facility];  Service: Gastroenterology;  Laterality: N/A;   EYE SURGERY  1995   rk (laser surgery), semi cornea transplant, detacted retina,  fluid removal   KNEE ARTHROSCOPY Left 03-03-2004   POLYPECTOMY  09/29/2019   Procedure: POLYPECTOMY;  Surgeon: Ronnette Juniper, MD;  [facility];  Service: Gastroenterology;;   POSTERIOR VITRECTOMY RIGHT EYE AND LASER   10-27-1999   SHOULDER SURGERY Right [place] W/ IMPLANT  2013 rod #1//  2014  rod 2   S1 --  T10  (rod #1)//   S1 -- T4 (rod #2)   THORACIC FUSION  03-13-2013   REMOVAL HARDWARE/  BONE GRAFT FUSION T10//  REVISION OF RODS   TOTAL KNEE ARTHROPLASTY Left 12-14-2005   UVULOPALATOPHARYNGOPLASTY  04-26-2006   w/  TONSILLECTOMY/  TURBINATE REDUCTIONS/  BILATERAL ANTERIOR ETHMOIDECTOMY    There were no vitals filed for this visit.   Subjective Assessment - 11/22/20 1318     Subjective Pt reports that her thoracic spine is still bothering her. She reports her HEP has been going well, although she states her chin tucks have been painful.    Limitations Standing;Walking;Lifting;House hold activities;Sitting    Currently in Pain? Yes    Pain Score 7     Pain  Location [place]    Pain Orientation Right;Left    Pain Descriptors / Indicators Dull;Sharp    Pain Type Chronic pain    Pain Onset More than a month ago    Pain Frequency Intermittent                               [facility] Adult PT Treatment/Exercise - 11/22/20 0001       Neck Exercises: Theraband   Horizontal ABduction --   YTB 2x10   Other Theraband Exercises BIL shoulder scaption with YTB 2x10    Other Theraband Exercises BIL shoulder extension YTB 2x10      Neck Exercises: Standing   Other Standing Exercises Forward and backward shoulder rolls/shrugs 1x15 4# dumbbells; 1x15 unweighted      Manual Therapy   Manual Therapy Soft tissue mobilization    Soft tissue mobilization MTPR/ cross friction to TrP's in BIL mid traps/ rhomboids   Performed in sitting with elbows on knees due to pt preference     Neck Exercises: Stretches   Upper Trapezius Stretch Limitations 2x30sec BIL    Levator Stretch Limitations 2x30sec BIL                    PT Education - 11/22/20 1452     Education Details Pt educated on efficacy of exercise in combination with DN or manual therapy. Informed that modalities in unison are not backed by literature. Pt also instructed on proper form when performing exercises.    Person(s) Educated Patient    Methods Explanation;Handout;Demonstration;Tactile cues;Verbal cues    Comprehension Verbal cues required;Tactile cues required;Returned demonstration;Verbalized understanding              PT Short Term Goals - 11/19/20 1432       PT SHORT TERM GOAL #1   Title The patient will be independent with a basic HEP for the thoracic / scapular pain.    Baseline no HEP    Time 3    Period Weeks    Target Date 12/10/20               PT Long Term Goals - 11/19/20 1433       PT LONG TERM GOAL #1   Title The patient will be able to sit for 30-45 minutes playing cards with pain < or equal to 4/10.    Baseline 20 minutes     Time 8    Period Weeks    Target Date 01/14/21      PT LONG TERM GOAL #2   Title The patient will be able to walk for 20-30 minutes for grocery shopping with pain < or equal  to 4/10.    Baseline 15 minutes    Time 8    Period Weeks    Target Date 01/14/21      PT LONG TERM GOAL #3   Title The patient have an improved FOTO score to 44% for functional home activities.    Baseline 36%    Time 8    Period Weeks    Target Date 01/14/21      PT LONG TERM GOAL #4   Title The patient will present with improvement of bilateral shoulder flexion AROM to 120 - 125 degrees for functional reaching.    Baseline 110 to 115 degrees    Time 8    Period Weeks    Target Date 01/14/21                   Plan - 11/22/20 1454     Clinical Impression Statement Pt initially not receptive to a therapy session in which DN would not be performed. She reports she was under the impression that she would only be having DN performed in PT. Discussed the efficacy of exercise in combination with DN or manual therapy vs. DN alone, to which the pt demonstrated understanding. The pt tolerated all treatment well today with no increase in pain and proper form when performing exercises. Additionally, the pt reported improved symptoms following MTPR to BIL mid traps/ rhomboids and expressed openness to similar treatments in the future. She will continue to benefit from skilled PT to address her primary impairments and to return to her prior functional baseline with minimal limitation.    Personal Factors and Comorbidities Comorbidity 3+    Comorbidities Psoriatic arthritis, OCD, DDD lumbar and cervical (fusion T4-L2), PVC, fibromyalgia, restless leg syndrome    Examination-Activity Limitations Bathing;Bed Mobility;Locomotion Level;Transfers;Reach Overhead;Dressing;Lift;Stand;Sleep;Carry;Caring for Others;Sit    Examination-Participation Restrictions Cleaning;Meal Prep;Driving;Shop;Laundry;Yard Work     [employer] Evolving/Moderate complexity    Clinical Decision Making Moderate    Rehab Potential Good    PT Frequency 2x / week    PT Duration 8 weeks    PT Treatment/Interventions ADLs/Self Care Home Management;Electrical Stimulation;Gait training;Stair training;Functional mobility training;Therapeutic activities;Therapeutic exercise;Neuromuscular re-education;Manual techniques;Balance training;Patient/family education;Scar mobilization;Passive range of motion;Spinal Manipulations    PT Next Visit Plan progress scapular training, postural training/strengthening, cervical stabilization.    PT Home Exercise Plan Access Code: [email or web address]    DUKGURKYH and Agree with Plan of Care Patient             Patient will benefit from skilled therapeutic intervention in order to improve the following deficits and impairments:  Decreased range of motion, Difficulty walking, Decreased mobility, Decreased strength, Hypomobility, Improper body mechanics, Decreased activity tolerance, Pain  Visit Diagnosis: Pain in thoracic spine  Muscle weakness (generalized)  Abnormal posture     Problem List Patient Active Problem List   Diagnosis Date Noted   C. difficile colitis 09/29/2019   Colitis 09/28/2019   Anxiety and depression 09/28/2019   DDD (degenerative disc disease), cervical s/p fusion 11/12/2016   H/O total knee replacement, left 05/06/2016   DDD lumbar spine status post fusion 05/06/2016   Psoriasis 05/05/2016   High risk medication use 05/05/2016   Cervical post-laminectomy syndrome 12/09/2015   Thoracic postlaminectomy syndrome 12/09/2015   Psoriatic arthritis ([place]) 08/23/2012   Osteopenia 08/23/2012   HLD (hyperlipidemia) 08/23/2012   RLS (restless legs syndrome) 08/23/2012   Insomnia 08/23/2012   Fibromyalgia syndrome 08/12/2012   Low back pain 08/12/2012   Syncope  PVC (premature ventricular contraction)    OCD (obsessive compulsive disorder)    GERD  (gastroesophageal reflux disease)    Hypertension     Vanessa [place], PT, DPT 11/22/20 2:58 PM   [facility] [address] [place], [place], [address] Phone: [phone]   Fax:  [phone]  Name: NAHOMY LIMBURG MRN: [id number] Date of Birth: December 09, 1950

## 2020-11-26 DIAGNOSIS — Z01818 Encounter for other preprocedural examination: Secondary | ICD-10-CM | POA: Diagnosis not present

## 2020-11-26 DIAGNOSIS — R9389 Abnormal findings on diagnostic imaging of other specified body structures: Secondary | ICD-10-CM | POA: Diagnosis not present

## 2020-11-26 NOTE — H&P (Signed)
Janet Peterson is an 70 y.o. postmenopausal G2P2 who is admitted for Hysteroscopy D&C with possible Myosure polypectomy for incidental finding of thickened endometrium on [place].  Patient was undergoing GI work-up for bloating and epigastric pain and had CT A/P. CT A/P on 10/02/20 revealed thickened-appearing endometrium on sagittal imaging measuring up to 66mm.   Patient had TVUS on 10/17/20 which was limited due to inadequate visualization of the uterus due to incomplete bladder distension with poor acoustic window and body habitus. The endometrium was not visualized. Bilateral ovaries were not visualized.Visualized portion of bladder unremarkable. No gross evidence of pelvic mass. Patient unable to tolerate transvaginal imaging.Assessment of endometrial complex could not be performed.  Patient has not had any pelvic pain or postmenopausal bleeding. Reviewed limitation of knowing significance of endometrial thickness in the absence of postmenopausal bleeding. Reviewed risks/benefits of surveillance vs endometrial sampling for diagnosis. Patient opts for endometrial sampling but did not feel she could tolerate EMB in the office and therefore chose Hysteroscopy D&C.  Patient Active Problem List   Diagnosis Date Noted   C. difficile colitis 09/29/2019   Colitis 09/28/2019   Anxiety and depression 09/28/2019   DDD (degenerative disc disease), cervical s/p fusion 11/12/2016   H/O total knee replacement, left 05/06/2016   DDD lumbar spine status post fusion 05/06/2016   Psoriasis 05/05/2016   High risk medication use 05/05/2016   Cervical post-laminectomy syndrome 12/09/2015   Thoracic postlaminectomy syndrome 12/09/2015   Psoriatic arthritis ([place]) 08/23/2012   Osteopenia 08/23/2012   HLD (hyperlipidemia) 08/23/2012   RLS (restless legs syndrome) 08/23/2012   Insomnia 08/23/2012   Fibromyalgia syndrome 08/12/2012   Low back pain 08/12/2012   Syncope    PVC (premature ventricular contraction)    OCD  (obsessive compulsive disorder)    GERD (gastroesophageal reflux disease)    Hypertension     MEDICAL/FAMILY/SOCIAL HX: No LMP recorded. Patient is postmenopausal.    Past Medical History:  Diagnosis Date   Ankylosing spondylitis ([place])    Anxiety and depression    Depression    Fibromyalgia    GERD (gastroesophageal reflux disease)    Hiatal hernia    per patient, dx by GI    History of basal cell carcinoma excision    FACE, 1992  &  1996   History of hiatal hernia    History of thrush    Hyperlipidemia    Hypertension    Insomnia    Left breast mass    OCD (obsessive compulsive disorder)    OSA (obstructive sleep apnea)    MILD AND NO CPAP SINCE SURGERY IN 2007   Osteopenia    PONV (postoperative nausea and vomiting)    Psoriatic arthritis ([place])    PVC (premature ventricular contraction)    Rheumatoid arthritis ([place])     Past Surgical History:  Procedure Laterality Date   BIOPSY  09/29/2019   Procedure: BIOPSY;  Surgeon: Ronnette Juniper, MD;  [facility];  Service: Gastroenterology;;   BREAST LUMPECTOMY WITH RADIOACTIVE SEED LOCALIZATION Left 05/04/2018   Procedure: LEFT BREAST LUMPECTOMY WITH RADIOACTIVE SEED LOCALIZATION AND LEFT BREAST NIPPLE BIOPSY;  Surgeon: Jovita Kussmaul, MD;  Location: [facility];  Service: General;  Laterality: Left;   BREAST SURGERY  1975   lumpectomy-- benign   BUNIONECTOMY  1991   CATARACT EXTRACTION W/ INTRAOCULAR LENS  IMPLANT, BILATERAL  2000   CERVICAL FUSION  March 2013   C5 -- [place]  COLONOSCOPY WITH PROPOFOL N/A 09/29/2019   Procedure: COLONOSCOPY WITH PROPOFOL;  Surgeon: Ronnette Juniper, MD;  [facility];  Service: Gastroenterology;  Laterality: N/A;   DISTAL INTERPHALANGEAL JOINT FUSION Right 03/14/2015   Procedure: RIGHT LONG FINGER DISTAL INTERPHALANGEAL JOINT ARTHRODESIS;  Surgeon: Iran Planas, MD;  Location: [facility];  Service: Orthopedics;  Laterality:  Right;   ESOPHAGOGASTRODUODENOSCOPY (EGD) WITH PROPOFOL N/A 09/29/2019   Procedure: ESOPHAGOGASTRODUODENOSCOPY (EGD) WITH PROPOFOL;  Surgeon: Ronnette Juniper, MD;  [facility];  Service: Gastroenterology;  Laterality: N/A;   EYE SURGERY  1995   rk (laser surgery), semi cornea transplant, detacted retina,  fluid removal   KNEE ARTHROSCOPY Left 03-03-2004   POLYPECTOMY  09/29/2019   Procedure: POLYPECTOMY;  Surgeon: Ronnette Juniper, MD;  [facility];  Service: Gastroenterology;;   POSTERIOR VITRECTOMY RIGHT EYE AND LASER   10-27-1999   SHOULDER SURGERY Right 1997   SPINAL FIXATION SURGERY W/ IMPLANT  2013 rod #1//  2014  rod 2   S1 -- T10  (rod #1)//   S1 -- T4 (rod #2)   THORACIC FUSION  03-13-2013   REMOVAL HARDWARE/  BONE GRAFT FUSION T10//  REVISION OF RODS   TOTAL KNEE ARTHROPLASTY Left 12-14-2005   UVULOPALATOPHARYNGOPLASTY  04-26-2006   w/  TONSILLECTOMY/  TURBINATE REDUCTIONS/  BILATERAL ANTERIOR ETHMOIDECTOMY    Family History  Problem Relation Age of Onset   Diabetes Mother    Heart disease Mother    Diabetes Father    Anuerysm Father    Diabetes Brother    Heart disease Brother    Diabetes Brother    Heart disease Brother     Social History:  reports that she quit smoking about 25 years ago. Her smoking use included cigarettes. She has a 5.00 pack-year smoking history. She has never used smokeless tobacco. She reports current alcohol use. She reports that she does not use drugs.  ALLERGIES/MEDS:  Allergies:  Allergies  Allergen Reactions   Latex Rash    And Mouth sores Other reaction(s): rash Other reaction(s): rash   Amlodipine Besy-Benazepril Hcl Other (See Comments)    COUGH   Amoxicillin-Pot Clavulanate Diarrhea and Nausea And Vomiting    Other reaction(s): sick on stomach   Bextra [Valdecoxib] Diarrhea and Nausea And Vomiting    REFLUX   Bupropion     Other reaction(s): rash, hives, nausea, high BP , sores, Other reaction(s): rash, hives,  nausea, high BP , sores,   Chlorhexidine     rash Other reaction(s): rash all over body   Lipitor [Atorvastatin Calcium] Other (See Comments)    MYALGIAS   Sulfa Antibiotics Nausea And Vomiting    CRAMPING Other reaction(s): stomach hurts   Zocor [Simvastatin] Other (See Comments)    MYALGIA    No medications prior to admission.     Review of Systems  Constitutional: Negative.   HENT: Negative.    Eyes: Negative.   Respiratory: Negative.    Cardiovascular: Negative.   Gastrointestinal: Negative.   Genitourinary: Negative.   Musculoskeletal: Negative.   Skin: Negative.   Neurological: Negative.   Endo/Heme/Allergies: Negative.   Psychiatric/Behavioral: Negative.     There were no vitals taken for this visit. Gen:  NAD, pleasant and cooperative Cardio:  RRR Pulm:  CTAB, no wheezes/rales/rhonchi Abd:  Soft, non-distended, non-tender throughout, no rebound/guarding Ext:  No bilateral LE edema, no bilateral calf tenderness Pelvic: declined, patient opts to have while in the OR  No results found for this or any  previous visit (from the past 24 hour(s)).  No results found.   ASSESSMENT/PLAN: Janet Peterson is a 70 y.o. postmenopausal G2P2 who is admitted for Hysteroscopy D&C with possible Myosure polypectomy for incidental finding of thickened endometrium on [place].  - Admit to [place] labs (CBC, T&S, COVID screen) - Diet:  NPO - IVF:  Per anesthesia - VTE Prophylaxis:  SCDs - Antibiotics: None indicated - D/C home same day  Consents: I discussed with the patient that this surgery is performed to look inside the uterus and remove the uterine lining.  Prior to surgery, the risks and benefits of the surgery, as well as alternative treatments, have been discussed.  The risks include, but are not limited to bleeding, including the need for a blood transfusion, infection, damage to organs and tissues, including uterine perforation, requiring additional surgery, postoperative  pain, short-term and long-term, failure of the procedure to control symptoms, need for hysterectomy to control bleeding, fluid overload, which could create electrolyte abnormalities and the need to stop the procedure before completion, inability to safely complete the procedure, deep vein thrombosis and/or pulmonary embolism, painful intercourse, complications the course of which cannot be predicted or prevented, and death.  Patient was consented for blood products.  The patient is aware that bleeding may result in the need for a blood transfusion which includes risk of transmission of HIV (1:2 million), Hepatitis C (1:2 million), and Hepatitis B (1:200 thousand) and transfusion reaction.  Patient voiced understanding of the above risks as well as understanding of indications for blood transfusion.   Drema Dallas, DO [phone] (office)

## 2020-11-27 ENCOUNTER — Ambulatory Visit: Payer: Medicare Other | Admitting: Physician Assistant

## 2020-11-27 ENCOUNTER — Ambulatory Visit (INDEPENDENT_AMBULATORY_CARE_PROVIDER_SITE_OTHER): Payer: Medicare Other | Admitting: Physician Assistant

## 2020-11-27 ENCOUNTER — Other Ambulatory Visit: Payer: Self-pay

## 2020-11-27 DIAGNOSIS — M1711 Unilateral primary osteoarthritis, right knee: Secondary | ICD-10-CM

## 2020-11-27 DIAGNOSIS — F329 Major depressive disorder, single episode, unspecified: Secondary | ICD-10-CM | POA: Diagnosis not present

## 2020-11-27 MED ORDER — HYALURONAN 30 MG/2ML IX SOSY
30.0000 mg | PREFILLED_SYRINGE | INTRA_ARTICULAR | Status: AC | PRN
  Administered 2020-11-27: 30 mg via INTRA_ARTICULAR

## 2020-11-27 MED ORDER — LIDOCAINE HCL 1 % IJ SOLN
1.5000 mL | INTRAMUSCULAR | Status: AC | PRN
  Administered 2020-11-27: 1.5 mL

## 2020-11-27 NOTE — Progress Notes (Signed)
   Procedure Note  Patient: Janet Peterson             Date of Birth: [birth year]           MRN: [id number]             Visit Date: 11/27/2020  Procedures: Visit Diagnoses:  1. Primary osteoarthritis of right knee    Orthovisc #2 Right knee joint injection  Large Joint Inj: R knee on 11/27/2020 8:14 AM Indications: pain Details: 25 G 1.5 in needle, medial approach  Arthrogram: No  Medications: 1.5 mL lidocaine 1 %; 30 mg Hyaluronan 30 MG/2ML Aspirate: 0 mL Outcome: tolerated well, no immediate complications Procedure, treatment alternatives, risks and benefits explained, specific risks discussed. Consent was given by the patient. Immediately prior to procedure a time out was called to verify the correct patient, procedure, equipment, support staff and site/side marked as required. Patient was prepped and draped in the usual sterile fashion.    Patient tolerated the procedure well.  Aftercare was discussed.  Hazel Sams, PA-C

## 2020-11-29 ENCOUNTER — Ambulatory Visit: Payer: Medicare Other

## 2020-11-29 ENCOUNTER — Other Ambulatory Visit: Payer: Self-pay

## 2020-11-29 DIAGNOSIS — M546 Pain in thoracic spine: Secondary | ICD-10-CM

## 2020-11-29 DIAGNOSIS — R293 Abnormal posture: Secondary | ICD-10-CM

## 2020-11-29 DIAGNOSIS — M6281 Muscle weakness (generalized): Secondary | ICD-10-CM

## 2020-11-29 DIAGNOSIS — M654 Radial styloid tenosynovitis [de Quervain]: Secondary | ICD-10-CM | POA: Diagnosis not present

## 2020-11-29 NOTE — Therapy (Signed)
[place], [place], [address] Phone: [phone]   Fax:  [phone]  Physical Therapy Treatment  Patient Details  Name: Janet Peterson MRN: [id number] Date of Birth: 07-Jan-1951 Referring Provider (PT): Charlett Blake, MD   Encounter Date: 11/29/2020   PT End of Session - 11/29/20 1233     Visit Number 3    Number of Visits 16    Date for PT Re-Evaluation 01/14/21    Authorization Type MCR    Authorization Time Period 6th visit FOTO, 10th visit FOTO / re-eval    PT Start Time 1215    PT Stop Time 1254    PT Time Calculation (min) 39 min             Past Medical History:  Diagnosis Date   Ankylosing spondylitis ([place])    Anxiety and depression    Depression    Fibromyalgia    GERD (gastroesophageal reflux disease)    Hiatal hernia    per patient, dx by GI    History of basal cell carcinoma excision    FACE, 1992  &  1996   History of hiatal hernia    History of thrush    Hyperlipidemia    Hypertension    Insomnia    Left breast mass    OCD (obsessive compulsive disorder)    OSA (obstructive sleep apnea)    MILD AND NO CPAP SINCE SURGERY IN 2007   Osteopenia    PONV (postoperative nausea and vomiting)    Psoriatic arthritis ([place])    PVC (premature ventricular contraction)    Rheumatoid arthritis ([place])     Past Surgical History:  Procedure Laterality Date   BIOPSY  09/29/2019   Procedure: BIOPSY;  Surgeon: Ronnette Juniper, MD;  [facility];  Service: Gastroenterology;;   BREAST LUMPECTOMY WITH RADIOACTIVE SEED LOCALIZATION Left 05/04/2018   Procedure: LEFT BREAST LUMPECTOMY WITH RADIOACTIVE SEED LOCALIZATION AND LEFT BREAST NIPPLE BIOPSY;  Surgeon: Jovita Kussmaul, MD;  Location: [facility];  Service: General;  Laterality: Left;   BREAST SURGERY  1975   lumpectomy-- benign   BUNIONECTOMY  1991   CATARACT EXTRACTION W/ INTRAOCULAR LENS  IMPLANT, BILATERAL  2000    CERVICAL FUSION  March 2013   C5 -- C7   CESAREAN SECTION  1985   COLONOSCOPY WITH PROPOFOL N/A 09/29/2019   Procedure: COLONOSCOPY WITH PROPOFOL;  Surgeon: Ronnette Juniper, MD;  [facility];  Service: Gastroenterology;  Laterality: N/A;   DISTAL INTERPHALANGEAL JOINT FUSION Right 03/14/2015   Procedure: RIGHT LONG FINGER DISTAL INTERPHALANGEAL JOINT ARTHRODESIS;  Surgeon: Iran Planas, MD;  Location: [facility];  Service: Orthopedics;  Laterality: Right;   ESOPHAGOGASTRODUODENOSCOPY (EGD) WITH PROPOFOL N/A 09/29/2019   Procedure: ESOPHAGOGASTRODUODENOSCOPY (EGD) WITH PROPOFOL;  Surgeon: Ronnette Juniper, MD;  [facility];  Service: Gastroenterology;  Laterality: N/A;   EYE SURGERY  1995   rk (laser surgery), semi cornea transplant, detacted retina,  fluid removal   KNEE ARTHROSCOPY Left 03-03-2004   POLYPECTOMY  09/29/2019   Procedure: POLYPECTOMY;  Surgeon: Ronnette Juniper, MD;  [facility];  Service: Gastroenterology;;   POSTERIOR VITRECTOMY RIGHT EYE AND LASER   10-27-1999   SHOULDER SURGERY Right 1997   SPINAL FIXATION SURGERY W/ IMPLANT  2013 rod #1//  2014  rod 2   S1 -- T10  (rod #1)//   S1 -- T4 (rod #2)   THORACIC FUSION  03-13-2013  REMOVAL HARDWARE/  BONE GRAFT FUSION T10//  REVISION OF RODS   TOTAL KNEE ARTHROPLASTY Left 12-14-2005   UVULOPALATOPHARYNGOPLASTY  04-26-2006   w/  TONSILLECTOMY/  TURBINATE REDUCTIONS/  BILATERAL ANTERIOR ETHMOIDECTOMY    There were no vitals filed for this visit.   Subjective Assessment - 11/29/20 1215     Subjective The patient reports that she was in pain from of the new exercises.  She reports that she is doing good now.    Limitations Standing;Walking;Lifting;House hold activities;Sitting    How long can you sit comfortably? 20 minutes    Patient Stated Goals To be able to walk more and get rid of the pain.    Currently in Pain? 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 Yes    Dry Needling Comments palpation and manual assessment during FDN    Other Dry Needling Patient educate on FDN benefits and risks prior to giving consent    Upper Trapezius Response Twitch reponse elicited;Palpable increased muscle length    Levator Scapulae Response Twitch response elicited    Rhomboids Response Palpable increased muscle length    Lower trapezius Response Palpable increased muscle length    Middle trapezius Response Palpable increased muscle length                    PT Short Term Goals - 11/19/20 1432       PT SHORT TERM GOAL #1   Title The patient will be independent with a basic HEP for the thoracic / scapular pain.    Baseline no HEP    Time 3    Period Weeks    Target Date 12/10/20               PT Long Term Goals - 11/19/20 1433       PT LONG TERM GOAL #1   Title The patient will be able to sit for 30-45 minutes playing cards with pain < or equal to 4/10.    Baseline 20 minutes    Time 8    Period Weeks    Target Date 01/14/21      PT  LONG TERM GOAL #2   Title  The patient will be able to walk for 20-30 minutes for grocery shopping with pain < or equal to 4/10.    Baseline 15 minutes    Time 8    Period Weeks    Target Date 01/14/21      PT LONG TERM GOAL #3   Title The patient have an improved FOTO score to 44% for functional home activities.    Baseline 36%    Time 8    Period Weeks    Target Date 01/14/21      PT LONG TERM GOAL #4   Title The patient will present with improvement of bilateral shoulder flexion AROM to 120 - 125 degrees for functional reaching.    Baseline 110 to 115 degrees    Time 8    Period Weeks    Target Date 01/14/21                   Plan - 11/29/20 1233     Clinical Impression Statement Continued with dry needling today for the UTs and scapular muscles.  The patient did get a twitch response.  We will change her next appointments to [facility] appointments as the exercises has aggravated her and she has done exercises for her neck in the past.  Performed STM post the [facility] today.  The patient tolerated therapy/  Recommend continued therapy for FDN and strengthening.    Personal Factors and Comorbidities Comorbidity 3+    Comorbidities Psoriatic arthritis, OCD, DDD lumbar and cervical (fusion T4-L2), PVC, fibromyalgia, restless leg syndrome    Examination-Activity Limitations Bathing;Bed Mobility;Locomotion Level;Transfers;Reach Overhead;Dressing;Lift;Stand;Sleep;Carry;Caring for Others;Sit    Examination-Participation Restrictions Cleaning;Meal Prep;Driving;Shop;Laundry;[employer] Work    PT Treatment/Interventions ADLs/Self Care Home Management;Electrical Stimulation;Gait training;Stair training;Functional mobility training;Therapeutic activities;Therapeutic exercise;Neuromuscular re-education;Manual techniques;Balance training;Patient/family education;Scar mobilization;Passive range of motion;Spinal Manipulations    PT Next Visit Plan progress scapular training, postural training/strengthening,  cervical stabilization.    PT Home Exercise Plan Access Code: [email or web address]    [email or web address] and Agree with Plan of Care Patient             Patient will benefit from skilled therapeutic intervention in order to improve the following deficits and impairments:  Decreased range of motion, Difficulty walking, Decreased mobility, Decreased strength, Hypomobility, Improper body mechanics, Decreased activity tolerance, Pain  Visit Diagnosis: Pain in thoracic spine  Muscle weakness (generalized)  Abnormal posture     Problem List Patient Active Problem List   Diagnosis Date Noted   C. difficile colitis 09/29/2019   Colitis 09/28/2019   Anxiety and depression 09/28/2019   DDD (degenerative disc disease), cervical s/p fusion 11/12/2016   H/O total knee replacement, left 05/06/2016   DDD lumbar spine status post fusion 05/06/2016   Psoriasis 05/05/2016   High risk medication use 05/05/2016   Cervical post-laminectomy syndrome 12/09/2015   Thoracic postlaminectomy syndrome 12/09/2015   Psoriatic arthritis ([place]) 08/23/2012   Osteopenia 08/23/2012   HLD (hyperlipidemia) 08/23/2012   RLS (restless legs syndrome) 08/23/2012   Insomnia 08/23/2012   Fibromyalgia syndrome 08/12/2012   Low back pain 08/12/2012   Syncope    PVC (premature ventricular contraction)    OCD (obsessive compulsive disorder)    GERD (gastroesophageal reflux disease)    Hypertension    Rich Number, PT, DPT, OCS, Crt. DN  Bethena Midget 11/29/2020, 12:54 PM  [facility] [address] [place], [place], [address] Phone: [phone]   Fax:  [phone]  Name: Janet Peterson MRN: [id number] Date  of Birth: 03/21/1951

## 2020-12-02 ENCOUNTER — Encounter (HOSPITAL_BASED_OUTPATIENT_CLINIC_OR_DEPARTMENT_OTHER): Payer: Self-pay | Admitting: Obstetrics and Gynecology

## 2020-12-02 ENCOUNTER — Other Ambulatory Visit: Payer: Self-pay

## 2020-12-02 ENCOUNTER — Ambulatory Visit: Payer: Medicare Other

## 2020-12-02 DIAGNOSIS — M546 Pain in thoracic spine: Secondary | ICD-10-CM

## 2020-12-02 DIAGNOSIS — M654 Radial styloid tenosynovitis [de Quervain]: Secondary | ICD-10-CM | POA: Diagnosis not present

## 2020-12-02 DIAGNOSIS — R293 Abnormal posture: Secondary | ICD-10-CM

## 2020-12-02 DIAGNOSIS — M6281 Muscle weakness (generalized): Secondary | ICD-10-CM

## 2020-12-02 NOTE — Therapy (Signed)
[place], [place], [address] Phone: [phone]   Fax:  [phone]  Physical Therapy Treatment  Patient Details  Name: ANNIEMAE HABERKORN MRN: [id number] Date of Birth: [birth year] Referring Provider (PT): Charlett Blake, MD   Encounter Date: 12/02/2020   PT End of Session - 12/02/20 1435     Visit Number 4    Number of Visits 16    Date for PT Re-Evaluation 01/14/21    Authorization Type MCR    Authorization Time Period 6th visit FOTO, 10th visit FOTO / re-eval    PT Start Time 1420   pt arrived late   PT Stop Time 1502    PT Time Calculation (min) 42 min    Activity Tolerance Patient tolerated treatment well    Behavior During Therapy WFL for tasks assessed/performed             Past Medical History:  Diagnosis Date   Ankylosing spondylitis ([place])    Anxiety and depression    Depression    Fibromyalgia    GERD (gastroesophageal reflux disease)    Hiatal hernia    per patient, dx by GI    History of basal cell carcinoma excision    FACE, 1992  &  1996   History of hiatal hernia    History of thrush    Hyperlipidemia    Hypertension    Insomnia    Left breast mass    OCD (obsessive compulsive disorder)    OSA (obstructive sleep apnea)    MILD AND NO CPAP SINCE SURGERY IN 2007   Osteopenia    PONV (postoperative nausea and vomiting)    Psoriatic arthritis ([place])    PVC (premature ventricular contraction)    Rheumatoid arthritis ([place])     Past Surgical History:  Procedure Laterality Date   BIOPSY  09/29/2019   Procedure: BIOPSY;  Surgeon: Ronnette Juniper, MD;  [facility];  Service: Gastroenterology;;   BREAST LUMPECTOMY WITH RADIOACTIVE SEED LOCALIZATION Left 05/04/2018   Procedure: LEFT BREAST LUMPECTOMY WITH RADIOACTIVE SEED LOCALIZATION AND LEFT BREAST NIPPLE BIOPSY;  Surgeon: Jovita Kussmaul, MD;  Location: [facility];  Service: General;  Laterality: Left;    BREAST SURGERY  1975   lumpectomy-- benign   BUNIONECTOMY  1991   CATARACT EXTRACTION W/ INTRAOCULAR LENS  IMPLANT, BILATERAL  2000   CERVICAL FUSION  March 2013   C5 -- C7   CESAREAN SECTION  1985   COLONOSCOPY WITH PROPOFOL N/A 09/29/2019   Procedure: COLONOSCOPY WITH PROPOFOL;  Surgeon: Ronnette Juniper, MD;  [facility];  Service: Gastroenterology;  Laterality: N/A;   DISTAL INTERPHALANGEAL JOINT FUSION Right 03/14/2015   Procedure: RIGHT LONG FINGER DISTAL INTERPHALANGEAL JOINT ARTHRODESIS;  Surgeon: Iran Planas, MD;  Location: [facility];  Service: Orthopedics;  Laterality: Right;   ESOPHAGOGASTRODUODENOSCOPY (EGD) WITH PROPOFOL N/A 09/29/2019   Procedure: ESOPHAGOGASTRODUODENOSCOPY (EGD) WITH PROPOFOL;  Surgeon: Ronnette Juniper, MD;  [facility];  Service: Gastroenterology;  Laterality: N/A;   EYE SURGERY  1995   rk (laser surgery), semi cornea transplant, detacted retina,  fluid removal   KNEE ARTHROSCOPY Left 03-03-2004   POLYPECTOMY  09/29/2019   Procedure: POLYPECTOMY;  Surgeon: Ronnette Juniper, MD;  [facility];  Service: Gastroenterology;;   POSTERIOR VITRECTOMY RIGHT EYE AND LASER   10-27-1999   SHOULDER SURGERY Right [place] W/ IMPLANT  2013 rod #1//  2014  rod 2  S1 -- T10  (rod #1)//   S1 -- T4 (rod #2)   THORACIC FUSION  03-13-2013   REMOVAL HARDWARE/  BONE GRAFT FUSION T10//  REVISION OF RODS   TOTAL KNEE ARTHROPLASTY Left 12-14-2005   UVULOPALATOPHARYNGOPLASTY  04-26-2006   w/  TONSILLECTOMY/  TURBINATE REDUCTIONS/  BILATERAL ANTERIOR ETHMOIDECTOMY    There were no vitals filed for this visit.   Subjective Assessment - 12/02/20 1424     Subjective The patient reports that she did well after last session in therapy.  She played with her granddaughter some yesterday.    How long can you walk comfortably? 15 minutes                [facility] PT Assessment - 12/02/20 0001       Assessment   Medical  Diagnosis M79.18 (ICD-10-CM) - Myofascial pain syndrome of thoracic spine    Referring Provider (PT) Kirsteins, Luanna Salk, MD                           [facility] Adult PT Treatment/Exercise - 12/02/20 0001       Neck Exercises: Seated   Neck Retraction 15 reps;3 secs      Shoulder Exercises: Standing   Flexion Both;15 reps    Flexion Limitations wall slides      Shoulder Exercises: Stretch   Corner Stretch 20 seconds;3 reps    Corner Stretch Limitations mid pectoralis      Manual Therapy   Manual therapy comments Manual palpation and assessment during FDN    Soft tissue mobilization MTPR/ cross friction to TrP's in BIL mid traps/ rhomboids   Performed in sitting with elbows on knees due to pt preference             Trigger Point Dry Needling - 12/02/20 0001     Consent Given? Yes    Dry Needling Comments palpation and manual assessment during FDN    Other Dry Needling Patient educate on FDN benefits and risks prior to giving consent    Upper Trapezius Response Twitch reponse elicited;Palpable increased muscle length    Levator Scapulae Response Twitch response elicited    Rhomboids Response Palpable increased muscle length    Lower trapezius Response Palpable increased muscle length    Middle trapezius Response Palpable increased muscle length                  PT Education - 12/02/20 1456     Education Details updated HEP    Person(s) Educated Patient    Methods Explanation;Handout    Comprehension Verbalized understanding              PT Short Term Goals - 11/19/20 1432       PT SHORT TERM GOAL #1   Title The patient will be independent with a basic HEP for the thoracic / scapular pain.    Baseline no HEP    Time 3    Period Weeks    Target Date 12/10/20               PT Long Term Goals - 11/19/20 1433       PT LONG TERM GOAL #1   Title The patient will be able to sit for 30-45 minutes playing cards with pain < or equal  to 4/10.    Baseline 20 minutes    Time 8    Period Weeks    Target Date  01/14/21      PT LONG TERM GOAL #2   Title The patient will be able to walk for 20-30 minutes for grocery shopping with pain < or equal to 4/10.    Baseline 15 minutes    Time 8    Period Weeks    Target Date 01/14/21      PT LONG TERM GOAL #3   Title The patient have an improved FOTO score to 44% for functional home activities.    Baseline 36%    Time 8    Period Weeks    Target Date 01/14/21      PT LONG TERM GOAL #4   Title The patient will present with improvement of bilateral shoulder flexion AROM to 120 - 125 degrees for functional reaching.    Baseline 110 to 115 degrees    Time 8    Period Weeks    Target Date 01/14/21                   Plan - 12/02/20 1436     Clinical Impression Statement The patient reports improvement of her symptoms since after last session in therapy.  Continued with FDN today.  She tolerated it well.  Performed STM post the [facility].  Scapular strengthening and pectoralis stretches were performed as well.  Added standing wall slides into flexion.  Her HEP was modified today.  The patient will be absent from therapy for two weeks due to having a surgical procedure.  Re-assess when she returns.    Personal Factors and Comorbidities Comorbidity 3+    Comorbidities Psoriatic arthritis, OCD, DDD lumbar and cervical (fusion T4-L2), PVC, fibromyalgia, restless leg syndrome    Examination-Activity Limitations Bathing;Bed Mobility;Locomotion Level;Transfers;Reach Overhead;Dressing;Lift;Stand;Sleep;Carry;Caring for Others;Sit    Examination-Participation Restrictions Cleaning;Meal Prep;Driving;Shop;Laundry;Valla Leaver Work    PT Treatment/Interventions ADLs/Self Care Home Management;Electrical Stimulation;Gait training;Stair training;Functional mobility training;Therapeutic activities;Therapeutic exercise;Neuromuscular re-education;Manual techniques;Balance training;Patient/family  education;Scar mobilization;Passive range of motion;Spinal Manipulations    PT Next Visit Plan progress scapular training, postural training/strengthening, cervical stabilization, FDN    PT Home Exercise Plan Access Code: [email or web address]    Consulted and Agree with Plan of Care Patient             Patient will benefit from skilled therapeutic intervention in order to improve the following deficits and impairments:  Decreased range of motion, Difficulty walking, Decreased mobility, Decreased strength, Hypomobility, Improper body mechanics, Decreased activity tolerance, Pain  Visit Diagnosis: Pain in thoracic spine  Muscle weakness (generalized)  Abnormal posture     Problem List Patient Active Problem List   Diagnosis Date Noted   C. difficile colitis 09/29/2019   Colitis 09/28/2019   Anxiety and depression 09/28/2019   DDD (degenerative disc disease), cervical s/p fusion 11/12/2016   H/O total knee replacement, left 05/06/2016   DDD lumbar spine status post fusion 05/06/2016   Psoriasis 05/05/2016   High risk medication use 05/05/2016   Cervical post-laminectomy syndrome 12/09/2015   Thoracic postlaminectomy syndrome 12/09/2015   Psoriatic arthritis ([place]) 08/23/2012   Osteopenia 08/23/2012   HLD (hyperlipidemia) 08/23/2012   RLS (restless legs syndrome) 08/23/2012   Insomnia 08/23/2012   Fibromyalgia syndrome 08/12/2012   Low back pain 08/12/2012   Syncope    PVC (premature ventricular contraction)    OCD (obsessive compulsive disorder)    GERD (gastroesophageal reflux disease)    Hypertension    Rich Number, PT, DPT, OCS, Crt. DN  Bethena Midget 12/02/2020, 3:02 PM  [facility]  [place], [place], [address] Phone: [phone]   Fax:  [phone]  Name: JULENA BARBOUR MRN: [id number] Date of Birth: 03-04-1951

## 2020-12-02 NOTE — Patient Instructions (Addendum)
Access Code: [email or web address] URL: [email or web address] Date: 12/02/2020 Prepared by: Rich Number  Exercises Standing Cervical Retraction - 2 x daily - 7 x weekly - 1 sets - 15 reps - 3 hold Shoulder External Rotation and Scapular Retraction - 2 x daily - 7 x weekly - 1 sets - 15 reps - 5 hold (RTB) Doorway Pec Stretch at 60 Elevation - 2 x daily - 7 x weekly - 1 sets - 3 reps - 20 hold Standing shoulder flexion wall slides - 2 x daily - 7 x weekly - 1 sets - 10 reps Seated Cervical Retraction - 2 x daily - 7 x weekly - 1 sets - 10 reps - 3 hold Shoulder extension with resistance - Neutral - 2 x daily - 7 x weekly - 1 sets - 15 reps - 5 hold

## 2020-12-04 ENCOUNTER — Other Ambulatory Visit: Payer: Self-pay

## 2020-12-04 ENCOUNTER — Ambulatory Visit (INDEPENDENT_AMBULATORY_CARE_PROVIDER_SITE_OTHER): Payer: Medicare Other | Admitting: Physician Assistant

## 2020-12-04 ENCOUNTER — Encounter (HOSPITAL_BASED_OUTPATIENT_CLINIC_OR_DEPARTMENT_OTHER)
Admission: RE | Admit: 2020-12-04 | Discharge: 2020-12-04 | Disposition: A | Discharge status: Home / Self Care | Payer: Medicare Other | Source: Ambulatory Visit | Attending: Obstetrics and Gynecology | Admitting: Obstetrics and Gynecology

## 2020-12-04 DIAGNOSIS — M1711 Unilateral primary osteoarthritis, right knee: Secondary | ICD-10-CM

## 2020-12-04 DIAGNOSIS — Z01812 Encounter for preprocedural laboratory examination: Secondary | ICD-10-CM | POA: Diagnosis not present

## 2020-12-04 LAB — TYPE AND SCREEN
ABO/RH(D): O NEG
Antibody Screen: NEGATIVE

## 2020-12-04 LAB — CBC
HCT: 34.6 % — ABNORMAL LOW (ref 36.0–46.0)
Hemoglobin: 11.3 g/dL — ABNORMAL LOW (ref 12.0–15.0)
MCH: 30.7 pg (ref 26.0–34.0)
MCHC: 32.7 g/dL (ref 30.0–36.0)
MCV: 94 fL (ref 80.0–100.0)
Platelets: 255 10*3/uL (ref 150–400)
RBC: 3.68 MIL/uL — ABNORMAL LOW (ref 3.87–5.11)
RDW: 13.5 % (ref 11.5–15.5)
WBC: 6.5 10*3/uL (ref 4.0–10.5)
nRBC: 0 % (ref 0.0–0.2)

## 2020-12-04 MED ORDER — LIDOCAINE HCL 1 % IJ SOLN
1.5000 mL | INTRAMUSCULAR | Status: AC | PRN
Start: 2020-12-04 — End: 2020-12-04
  Administered 2020-12-04: 1.5 mL via INTRA_ARTICULAR

## 2020-12-04 MED ORDER — HYALURONAN 30 MG/2ML IX SOSY
30.0000 mg | PREFILLED_SYRINGE | INTRA_ARTICULAR | Status: AC | PRN
Start: 2020-12-04 — End: 2020-12-04
  Administered 2020-12-04: 30 mg via INTRA_ARTICULAR

## 2020-12-04 NOTE — Progress Notes (Signed)
   Procedure Note  Patient: Janet Peterson             Date of Birth: 1950/07/21           MRN: [id number]             Visit Date: 12/04/2020  Procedures: Visit Diagnoses:  1. Primary osteoarthritis of right knee    Orthovisc #3 right knee, B/B  Large Joint Inj: R knee on 12/04/2020 8:51 AM Indications: pain Details: 25 G 1.5 in medial  Arthrogram: No  Medications: 1.5 mL lidocaine 1 %; 30 mg Hyaluronan 30 MG/2ML Aspirate: 0 mL Outcome: tolerated well, no immediate complications   Patient tolerated the procedure well.  Aftercare was discussed.  Hazel Sams, PA-C

## 2020-12-09 ENCOUNTER — Encounter (HOSPITAL_BASED_OUTPATIENT_CLINIC_OR_DEPARTMENT_OTHER): Payer: Self-pay | Admitting: Obstetrics and Gynecology

## 2020-12-09 ENCOUNTER — Ambulatory Visit (HOSPITAL_BASED_OUTPATIENT_CLINIC_OR_DEPARTMENT_OTHER): Payer: Medicare Other | Admitting: Anesthesiology

## 2020-12-09 ENCOUNTER — Encounter (HOSPITAL_BASED_OUTPATIENT_CLINIC_OR_DEPARTMENT_OTHER)
Admission: RE | Disposition: A | Discharge status: Home / Self Care | Payer: Self-pay | Source: Home / Self Care | Attending: Obstetrics and Gynecology

## 2020-12-09 ENCOUNTER — Ambulatory Visit (HOSPITAL_BASED_OUTPATIENT_CLINIC_OR_DEPARTMENT_OTHER)
Admission: RE | Admit: 2020-12-09 | Discharge: 2020-12-09 | Disposition: A | Discharge status: Home / Self Care | Payer: Medicare Other | Attending: Obstetrics and Gynecology | Admitting: Obstetrics and Gynecology

## 2020-12-09 ENCOUNTER — Other Ambulatory Visit: Payer: Self-pay

## 2020-12-09 DIAGNOSIS — Z79899 Other long term (current) drug therapy: Secondary | ICD-10-CM | POA: Insufficient documentation

## 2020-12-09 DIAGNOSIS — Z880 Allergy status to penicillin: Secondary | ICD-10-CM | POA: Diagnosis not present

## 2020-12-09 DIAGNOSIS — Z87891 Personal history of nicotine dependence: Secondary | ICD-10-CM | POA: Diagnosis not present

## 2020-12-09 DIAGNOSIS — Z882 Allergy status to sulfonamides status: Secondary | ICD-10-CM | POA: Insufficient documentation

## 2020-12-09 DIAGNOSIS — R9389 Abnormal findings on diagnostic imaging of other specified body structures: Secondary | ICD-10-CM | POA: Diagnosis not present

## 2020-12-09 DIAGNOSIS — Z888 Allergy status to other drugs, medicaments and biological substances status: Secondary | ICD-10-CM | POA: Insufficient documentation

## 2020-12-09 DIAGNOSIS — Z780 Asymptomatic menopausal state: Secondary | ICD-10-CM | POA: Diagnosis not present

## 2020-12-09 DIAGNOSIS — E785 Hyperlipidemia, unspecified: Secondary | ICD-10-CM | POA: Diagnosis not present

## 2020-12-09 DIAGNOSIS — N858 Other specified noninflammatory disorders of uterus: Secondary | ICD-10-CM | POA: Insufficient documentation

## 2020-12-09 DIAGNOSIS — F418 Other specified anxiety disorders: Secondary | ICD-10-CM | POA: Diagnosis not present

## 2020-12-09 HISTORY — PX: HYSTEROSCOPY WITH D & C: SHX1775

## 2020-12-09 SURGERY — DILATATION AND CURETTAGE /HYSTEROSCOPY
Anesthesia: General | Site: Vagina

## 2020-12-09 MED ORDER — FENTANYL CITRATE (PF) 100 MCG/2ML IJ SOLN
INTRAMUSCULAR | Status: AC
  Filled 2020-12-09: qty 2

## 2020-12-09 MED ORDER — BUPIVACAINE HCL (PF) 0.25 % IJ SOLN
INTRAMUSCULAR | Status: AC
  Filled 2020-12-09: qty 30

## 2020-12-09 MED ORDER — DEXAMETHASONE SODIUM PHOSPHATE 10 MG/ML IJ SOLN
INTRAMUSCULAR | Status: AC
  Filled 2020-12-09: qty 1

## 2020-12-09 MED ORDER — LIDOCAINE HCL (PF) 2 % IJ SOLN
INTRAMUSCULAR | Status: AC
  Filled 2020-12-09: qty 5

## 2020-12-09 MED ORDER — PROPOFOL 10 MG/ML IV BOLUS
INTRAVENOUS | Status: DC | PRN
  Administered 2020-12-09: 150 mg via INTRAVENOUS

## 2020-12-09 MED ORDER — EPHEDRINE SULFATE 50 MG/ML IJ SOLN
INTRAMUSCULAR | Status: DC | PRN
  Administered 2020-12-09: 10 mg via INTRAVENOUS
  Administered 2020-12-09: 15 mg via INTRAVENOUS

## 2020-12-09 MED ORDER — LIDOCAINE HCL (CARDIAC) PF 100 MG/5ML IV SOSY
PREFILLED_SYRINGE | INTRAVENOUS | Status: DC | PRN
  Administered 2020-12-09: 60 mg via INTRATRACHEAL

## 2020-12-09 MED ORDER — DEXAMETHASONE SODIUM PHOSPHATE 10 MG/ML IJ SOLN
INTRAMUSCULAR | Status: DC | PRN
  Administered 2020-12-09: 5 mg via INTRAVENOUS

## 2020-12-09 MED ORDER — OXYCODONE HCL 5 MG/5ML PO SOLN: 5.0000 mg | Freq: Once | ORAL | Status: AC | PRN

## 2020-12-09 MED ORDER — FENTANYL CITRATE (PF) 100 MCG/2ML IJ SOLN
25.0000 ug | INTRAMUSCULAR | Status: DC | PRN
  Administered 2020-12-09: 50 ug via INTRAVENOUS

## 2020-12-09 MED ORDER — ONDANSETRON HCL 4 MG/2ML IJ SOLN
INTRAMUSCULAR | Status: AC
  Filled 2020-12-09: qty 2

## 2020-12-09 MED ORDER — OXYCODONE HCL 5 MG PO TABS
ORAL_TABLET | ORAL | Status: AC
  Filled 2020-12-09: qty 1

## 2020-12-09 MED ORDER — SILVER NITRATE-POT NITRATE 75-25 % EX MISC
CUTANEOUS | Status: AC
  Filled 2020-12-09: qty 10

## 2020-12-09 MED ORDER — ACETAMINOPHEN 160 MG/5ML PO SOLN: 325.0000 mg | ORAL | Status: DC | PRN

## 2020-12-09 MED ORDER — LIDOCAINE-EPINEPHRINE 1 %-1:100000 IJ SOLN
INTRAMUSCULAR | Status: AC
  Filled 2020-12-09: qty 1

## 2020-12-09 MED ORDER — FENTANYL CITRATE (PF) 100 MCG/2ML IJ SOLN
INTRAMUSCULAR | Status: DC | PRN
  Administered 2020-12-09: 50 ug via INTRAVENOUS

## 2020-12-09 MED ORDER — LACTATED RINGERS IV SOLN: INTRAVENOUS | Status: DC

## 2020-12-09 MED ORDER — PHENYLEPHRINE HCL (PRESSORS) 10 MG/ML IV SOLN
INTRAVENOUS | Status: DC | PRN
  Administered 2020-12-09: 80 ug via INTRAVENOUS

## 2020-12-09 MED ORDER — PROPOFOL 10 MG/ML IV BOLUS
INTRAVENOUS | Status: AC
  Filled 2020-12-09: qty 20

## 2020-12-09 MED ORDER — SODIUM CHLORIDE 0.9 % IR SOLN
Status: DC | PRN
  Administered 2020-12-09: 1

## 2020-12-09 MED ORDER — ACETAMINOPHEN 325 MG PO TABS
325.0000 mg | ORAL_TABLET | ORAL | Status: DC | PRN
Start: 2020-12-09 — End: 2020-12-09
  Administered 2020-12-09: 650 mg via ORAL

## 2020-12-09 MED ORDER — OXYCODONE HCL 5 MG PO TABS: 5.0000 mg | ORAL_TABLET | ORAL | 0 refills | Status: AC | PRN

## 2020-12-09 MED ORDER — ACETAMINOPHEN 325 MG PO TABS
ORAL_TABLET | ORAL | Status: AC
  Filled 2020-12-09: qty 2

## 2020-12-09 MED ORDER — ONDANSETRON HCL 4 MG/2ML IJ SOLN
INTRAMUSCULAR | Status: DC | PRN
  Administered 2020-12-09: 4 mg via INTRAVENOUS

## 2020-12-09 MED ORDER — OXYCODONE HCL 5 MG PO TABS
5.0000 mg | ORAL_TABLET | Freq: Once | ORAL | Status: AC | PRN
  Administered 2020-12-09: 5 mg via ORAL

## 2020-12-09 MED ORDER — ONDANSETRON HCL 4 MG/2ML IJ SOLN: 4.0000 mg | Freq: Once | INTRAMUSCULAR | Status: DC | PRN

## 2020-12-09 MED ORDER — MEPERIDINE HCL 25 MG/ML IJ SOLN: 6.2500 mg | INTRAMUSCULAR | Status: DC | PRN

## 2020-12-09 MED ORDER — SILVER NITRATE-POT NITRATE 75-25 % EX MISC
CUTANEOUS | Status: DC | PRN
  Administered 2020-12-09: 2 via TOPICAL

## 2020-12-09 SURGICAL SUPPLY — 19 items
CANISTER SUCT 1200ML W/VALVE (MISCELLANEOUS) ×3 IMPLANT
CATH ROBINSON RED A/P 16FR (CATHETERS) ×3 IMPLANT
DEVICE MYOSURE LITE (MISCELLANEOUS) IMPLANT
DEVICE MYOSURE REACH (MISCELLANEOUS) IMPLANT
DILATOR CANAL MILEX (MISCELLANEOUS) IMPLANT
GAUZE 4X4 16PLY ~~LOC~~+RFID DBL (SPONGE) ×5 IMPLANT
GLOVE SURG ENC TEXT LTX SZ6.5 (GLOVE) ×3 IMPLANT
GLOVE SURG POLYISO LF SZ6.5 (GLOVE) ×3 IMPLANT
GLOVE SURG UNDER POLY LF SZ7 (GLOVE) ×3 IMPLANT
GOWN STRL REUS W/ TWL LRG LVL3 (GOWN DISPOSABLE) ×4 IMPLANT
GOWN STRL REUS W/TWL LRG LVL3 (GOWN DISPOSABLE) ×6
KIT PROCEDURE FLUENT (KITS) ×3 IMPLANT
KIT TURNOVER KIT B (KITS) ×3 IMPLANT
PACK VAGINAL MINOR WOMEN LF (CUSTOM PROCEDURE TRAY) ×3 IMPLANT
PAD OB MATERNITY 4.3X12.25 (PERSONAL CARE ITEMS) ×3 IMPLANT
SEAL ROD LENS SCOPE MYOSURE (ABLATOR) ×3 IMPLANT
SLEEVE SCD COMPRESS KNEE MED (STOCKING) ×3 IMPLANT
TOWEL GREEN STERILE FF (TOWEL DISPOSABLE) ×6 IMPLANT
UNDERPAD 30X36 HEAVY ABSORB (UNDERPADS AND DIAPERS) ×3 IMPLANT

## 2020-12-09 NOTE — OR Nursing (Signed)
Straight in and out cath16 french inserted and removed after betadine prep and prior to procedure with approximatley 100 mL of urine return. (Documented as "external cath"

## 2020-12-09 NOTE — Anesthesia Procedure Notes (Signed)
Procedure Name: LMA Insertion Date/Time: 12/09/2020 12:30 PM Performed by: Glory Buff, CRNA Pre-anesthesia Checklist: Patient identified, Emergency Drugs available, Suction available and Patient being monitored Patient Re-evaluated:Patient Re-evaluated prior to induction Oxygen Delivery Method: Circle system utilized Preoxygenation: Pre-oxygenation with 100% oxygen Induction Type: IV induction LMA: LMA inserted LMA Size: 4.0 Number of attempts: 1 Placement Confirmation: positive ETCO2 Tube secured with: Tape Dental Injury: Teeth and Oropharynx as per pre-operative assessment

## 2020-12-09 NOTE — Op Note (Signed)
Pre Op Dx:   Thickened endometrium on imaging  Post Op Dx:   Same as pre-operative diagnosis  Procedure:  Hysteroscopy with Dilation and Curettage   Surgeon:  Dr. Drema Dallas Assistants:  None Anesthesia:  General   EBL:  5cc  IVF:  900cc UOP:  100cc via in-and-out catheter Fluid Deficit:  45cc   Drains:  None Specimen removed:  Endometrial curettings - sent to pathology Device(s) implanted: None Case Type:  Clean-contaminated Findings:  Atrophic endometrium. Bilateral tubal ostia visualized. No evidence of endometrial or cervical polyps. Uterus sounded to 7cm. Complications: None Indications:  70 y.o. postmenopausal G2P2 with incidental finding of thickened endometrium on CT A/P sagittal imaging. TVUS unable to evaluate endometrium due to poor acoustic window. Reviewed risks/benefits of endometrial sampling due to thickened endometrium in the absence of postmenopausal bleeding. Offered endometrial biopsy in the office; however, patient felt she would not be able to tolerate in the office and opted for Hysteroscopy D&C for further evaluation of the endometrium. Description of each procedure:  After informed consent was obtained the patient was taken to the operating room in the dorsal supine position.  After administration of general anesthesia, the patient was placed in the dorsal lithotomy position and prepped and draped in the usual sterile fashion.  Her bladder was emptied using an in and out catheter.  A pre-operative time-out was completed.  The anterior lip of the cervix was grasped with a single-tooth tenaculum and the cervix was serially dilated to accommodate the hysteroscope.  The hysteroscope was advanced and the findings as above was noted.  A sharp and smooth banjo curette were used to curettage the endometrium. The single-tooth tenaculum was removed and its sites were made hemostatic.  Adequate hemostasis was noted.  The patient was awakened and extubated and appeared to  have tolerated the procedure well.  All counts were correct. Disposition:  PACU  Drema Dallas, DO

## 2020-12-09 NOTE — Addendum Note (Signed)
Addendum  created 12/09/20 1415 by Glory Buff, CRNA   Charge Capture section accepted

## 2020-12-09 NOTE — Anesthesia Postprocedure Evaluation (Signed)
Anesthesia Post Note  Patient: Janet Peterson  Procedure(s) Performed: DILATATION AND CURETTAGE /HYSTEROSCOPY (Vagina )     Patient location during evaluation: PACU Anesthesia Type: General Level of consciousness: awake and alert Pain management: pain level controlled Vital Signs Assessment: post-procedure vital signs reviewed and stable Respiratory status: spontaneous breathing, nonlabored ventilation, respiratory function stable and patient connected to nasal cannula oxygen Cardiovascular status: blood pressure returned to baseline and stable Postop Assessment: no apparent nausea or vomiting Anesthetic complications: no   No notable events documented.  Last Vitals:  Vitals:   12/09/20 1330 12/09/20 1345  BP: 125/72 (!) 148/82  Pulse: 77 77  Resp: 14 11  Temp:    SpO2: 97% 92%    Last Pain:  Vitals:   12/09/20 1323  TempSrc:   PainSc: Asleep                 Adesuwa Osgood

## 2020-12-09 NOTE — Discharge Instructions (Addendum)
  Post Anesthesia Home Care Instructions  Activity: Get plenty of rest for the remainder of the day. A responsible individual must stay with you for 24 hours following the procedure.  For the next 24 hours, DO NOT: -Drive a car -Paediatric nurse -Drink alcoholic beverages -Take any medication unless instructed by your physician -Make any legal decisions or sign important papers.  Meals: Start with liquid foods such as gelatin or soup. Progress to regular foods as tolerated. Avoid greasy, spicy, heavy foods. If nausea and/or vomiting occur, drink only clear liquids until the nausea and/or vomiting subsides. Call your physician if vomiting continues.  Special Instructions/Symptoms: Your throat may feel dry or sore from the anesthesia or the breathing tube placed in your throat during surgery. If this causes discomfort, gargle with warm salt water. The discomfort should disappear within 24 hours.      Tylenol given at 2:03 p.m.

## 2020-12-09 NOTE — Transfer of Care (Signed)
Immediate Anesthesia Transfer of Care Note  Patient: Janet Peterson  Procedure(s) Performed: DILATATION AND CURETTAGE /HYSTEROSCOPY  Patient Location: PACU  Anesthesia Type:General  Level of Consciousness: drowsy, patient cooperative and responds to stimulation  Airway & Oxygen Therapy: Patient Spontanous Breathing and Patient connected to face mask oxygen  Post-op Assessment: Report given to RN and Post -op Vital signs reviewed and stable  Post vital signs: Reviewed and stable  Last Vitals:  Vitals Value Taken Time  BP    Temp    Pulse    Resp    SpO2      Last Pain:  Vitals:   12/09/20 1059  TempSrc: Oral  PainSc: 0-No pain      Patients Stated Pain Goal: 3 (32/12/24 8250)  Complications: No notable events documented.

## 2020-12-09 NOTE — Interval H&P Note (Signed)
History and Physical Interval Note:  12/09/2020 11:44 AM  Janet Peterson  has presented today for surgery, with the diagnosis of Thickend Endometrium.  The various methods of treatment have been discussed with the patient and family. After consideration of risks, benefits and other options for treatment, the patient has consented to  Procedure(s): [place] (N/A) as a surgical intervention.  The patient's history has been reviewed, patient examined, no change in status, stable for surgery.  I have reviewed the patient's chart and labs.  Questions were answered to the patient's satisfaction.     Drema Dallas

## 2020-12-09 NOTE — Anesthesia Preprocedure Evaluation (Addendum)
Anesthesia Evaluation  Patient identified by MRN, date of birth, ID band Patient awake    Reviewed: Allergy & Precautions, H&P , NPO status , Patient's Chart, lab work & pertinent test results, reviewed documented beta blocker date and time   History of Anesthesia Complications (+) PONV and history of anesthetic complications  Airway Mallampati: III  TM Distance: >3 FB Neck ROM: full    Dental no notable dental hx. (+) Edentulous Upper, Edentulous Lower, Implants,    Pulmonary sleep apnea , former smoker,    Pulmonary exam normal breath sounds clear to auscultation       Cardiovascular Exercise Tolerance: Good hypertension, Pt. on medications Normal cardiovascular exam Rhythm:regular Rate:Normal     Neuro/Psych PSYCHIATRIC DISORDERS Anxiety Depression  Neuromuscular disease    GI/Hepatic Neg liver ROS, hiatal hernia, GERD  Medicated,  Endo/Other  Morbid obesity  Renal/GU negative Renal ROS  negative genitourinary   Musculoskeletal  (+) Arthritis , Osteoarthritis and Rheumatoid disorders,  Fibromyalgia -  Abdominal   Peds  Hematology negative hematology ROS (+)   Anesthesia Other Findings   Reproductive/Obstetrics negative OB ROS                           Anesthesia Physical Anesthesia Plan  ASA: 3  Anesthesia Plan: General   Post-op Pain Management:    Induction: Intravenous  PONV Risk Score and Plan: 4 or greater and Ondansetron and Dexamethasone  Airway Management Planned: LMA  Additional Equipment: None  Intra-op Plan:   Post-operative Plan: Extubation in OR  Informed Consent: I have reviewed the patients History and Physical, chart, labs and discussed the procedure including the risks, benefits and alternatives for the proposed anesthesia with the patient or authorized representative who has indicated his/her understanding and acceptance.     Dental Advisory  Given  Plan Discussed with: CRNA and Anesthesiologist  Anesthesia Plan Comments: ( )        Anesthesia Quick Evaluation

## 2020-12-10 ENCOUNTER — Encounter (HOSPITAL_BASED_OUTPATIENT_CLINIC_OR_DEPARTMENT_OTHER): Payer: Self-pay | Admitting: Obstetrics and Gynecology

## 2020-12-10 LAB — SURGICAL PATHOLOGY

## 2020-12-17 ENCOUNTER — Ambulatory Visit: Payer: Medicare Other

## 2020-12-17 ENCOUNTER — Other Ambulatory Visit: Payer: Self-pay

## 2020-12-17 ENCOUNTER — Encounter: Payer: Self-pay | Admitting: Physical Medicine & Rehabilitation

## 2020-12-17 ENCOUNTER — Encounter: Payer: Medicare Other | Attending: Physical Medicine & Rehabilitation | Admitting: Physical Medicine & Rehabilitation

## 2020-12-17 VITALS — BP 133/86 | HR 84 | Temp 98.8°F | Ht 64.0 in | Wt 194.0 lb

## 2020-12-17 DIAGNOSIS — M654 Radial styloid tenosynovitis [de Quervain]: Secondary | ICD-10-CM | POA: Diagnosis not present

## 2020-12-17 DIAGNOSIS — M6281 Muscle weakness (generalized): Secondary | ICD-10-CM

## 2020-12-17 DIAGNOSIS — R293 Abnormal posture: Secondary | ICD-10-CM

## 2020-12-17 DIAGNOSIS — M546 Pain in thoracic spine: Secondary | ICD-10-CM

## 2020-12-17 NOTE — Patient Instructions (Signed)
De Quervain's Tenosynovitis  De Quervain's tenosynovitis is a condition that causes inflammation of the tendon on the thumb side of the wrist. Tendons are cords of tissue that connect bones to muscles. The tendons in the hand pass through a tunnel called a sheath. A slippery layer of tissue (synovium) lets the tendons move smoothly in the sheath. With de Quervain'stenosynovitis, the sheath swells or thickens, causing friction and pain. The condition is also called de Quervain's disease and de Quervain's syndrome.It occurs most often in women who are 60-78 years old. What are the causes? The exact cause of this condition is not known. It may be associated withoveruse of the hand and wrist. What increases the risk? You are more likely to develop this condition if you: Use your hands far more than normal, especially if you repeat certain movements that involve twisting your hand or using a tight grip. Are pregnant. Are a middle-aged woman. Have rheumatoid arthritis. Have diabetes. What are the signs or symptoms? The main symptom of this condition is pain on the thumb side of the wrist. The pain may get worse when you grasp something or turn your wrist. Other symptoms may include: Pain that extends up the forearm. Swelling of your wrist and hand. Trouble moving the thumb and wrist. A sensation of snapping in the wrist. A bump filled with fluid (cyst) in the area of the pain. How is this diagnosed? This condition may be diagnosed based on: Your symptoms and medical history. A physical exam. During the exam, your health care provider may do a simple test Wynn Maudlin test) that involves pulling your thumb and wrist to see if this causes pain. You may also need to have an X-ray or ultrasound. How is this treated? Treatment for this condition may include: Avoiding any activity that causes pain and swelling. Taking medicines. Anti-inflammatory medicines and corticosteroid injections may be used to  reduce inflammation and relieve pain. Wearing a splint. Having surgery. This may be needed if other treatments do not work. Once the pain and swelling have gone down, you may start: Physical therapy. This includes exercises to improve movement and strength in your wrist and thumb. Occupational therapy. This includes adjusting how you move your wrist. Follow these instructions at home: If you have a splint: Wear the splint as told by your health care provider. Remove it only as told by your health care provider. Loosen the splint if your fingers tingle, become numb, or turn cold and blue. Keep the splint clean. If the splint is not waterproof: Do not let it get wet. Cover it with a watertight covering when you take a bath or a shower. Managing pain, stiffness, and swelling  Avoid movements and activities that cause pain and swelling in the wrist area. If directed, put ice on the painful area. This may be helpful after doing activities that involve the sore wrist. To do this: Put ice in a plastic bag. Place a towel between your skin and the bag. Leave the ice on for 20 minutes, 2-3 times a day. Remove the ice if your skin turns bright red. This is very important. If you cannot feel pain, heat, or cold, you have a greater risk of damage to the area. Move your fingers often to reduce stiffness and swelling. Raise (elevate) the injured area above the level of your heart while you are sitting or lying down.  General instructions Return to your normal activities as told by your health care provider. Ask your health care  provider what activities are safe for you. Take over-the-counter and prescription medicines only as told by your health care provider. Keep all follow-up visits. This is important. Contact a health care provider if: Your pain medicine does not help. Your pain gets worse. You develop new symptoms. Summary De Quervain's tenosynovitis is a condition that causes inflammation of  the tendon on the thumb side of the wrist. The condition occurs most often in women who are 83-29 years old. The exact cause of this condition is not known. It may be associated with overuse of the hand and wrist. Treatment starts with avoiding activity that causes pain or swelling in the wrist area. Other treatments may include wearing a splint and taking medicine. Sometimes, surgery is needed. This information is not intended to replace advice given to you by your health care provider. Make sure you discuss any questions you have with your healthcare provider. Document Revised: 08/23/2019 Document Reviewed: 08/23/2019 Elsevier Patient Education  2022 [employer].

## 2020-12-17 NOTE — Progress Notes (Signed)
Right first extensor compartment injection Indication stenosing tenosynovitis right APL right EPB De Quervain's syndrome right side Informed consent was obtained after describing risks and benefits of the procedure with patient including bleeding bruising and infection.  Patient has signed written consent and has elected to proceed. Patient placed in a seated position a 15 Hz linear transducer was utilized to identify the APL/EPB.  Skin overlying area was prepped with Betadine 25-gauge 1.5 inch needle was used anesthetize skin and subcu tissue with 1 cc of 1% lidocaine Then 25-gauge 1.5 inch needle was inserted in and out of plane approach into the septum of the first dorsal compartment overlying the EPB a second needle puncture was used to go out of plane approach into the separated compartment of the APL 0.75 mL of a solution containing 1.5 mL of 6 mg/mL Celestone and 1 mL of 1% lidocaine was injected into each of the separate compartments. Patient tolerated procedure well Postinjection instructions given

## 2020-12-17 NOTE — Therapy (Signed)
[facility] [place], [place], [address] Phone: [phone]   Fax:  [phone]  Physical Therapy Treatment  Patient Details  Name: LILIAUNA GUDGEL MRN: [id number] Date of Birth: 04-10-1951 Referring Provider (PT): Charlett Blake, MD   Encounter Date: 12/17/2020   PT End of Session - 12/17/20 1411     Visit Number 5    Number of Visits 16    Date for PT Re-Evaluation 01/14/21    Authorization Type MCR    Authorization Time Period 6th visit FOTO, 10th visit FOTO / re-eval    PT Start Time [id number]    PT Stop Time 1455    PT Time Calculation (min) 44 min    Activity Tolerance Patient tolerated treatment well    Behavior During Therapy WFL for tasks assessed/performed             Past Medical History:  Diagnosis Date   Ankylosing spondylitis ([place])    Anxiety and depression    Depression    Fibromyalgia    GERD (gastroesophageal reflux disease)    Hiatal hernia    per patient, dx by GI    History of basal cell carcinoma excision    FACE, 1992  &  1996   History of hiatal hernia    History of thrush    Hyperlipidemia    Hypertension    Insomnia    Left breast mass    OCD (obsessive compulsive disorder)    Osteopenia    PONV (postoperative nausea and vomiting)    Psoriatic arthritis ([place])    PVC (premature ventricular contraction)    Rheumatoid arthritis ([place])     Past Surgical History:  Procedure Laterality Date   BIOPSY  09/29/2019   Procedure: BIOPSY;  Surgeon: Ronnette Juniper, MD;  Location: [facility];  Service: Gastroenterology;;   BREAST LUMPECTOMY WITH RADIOACTIVE SEED LOCALIZATION Left 05/04/2018   Procedure: LEFT BREAST LUMPECTOMY WITH RADIOACTIVE SEED LOCALIZATION AND LEFT BREAST NIPPLE BIOPSY;  Surgeon: Jovita Kussmaul, MD;  Location: [facility];  Service: General;  Laterality: Left;   BREAST SURGERY  1975   lumpectomy-- benign   BUNIONECTOMY  1991   CATARACT EXTRACTION W/  INTRAOCULAR LENS  IMPLANT, BILATERAL  2000   CERVICAL FUSION  March 2013   C5 -- C7   CESAREAN SECTION  1985   COLONOSCOPY WITH PROPOFOL N/A 09/29/2019   Procedure: COLONOSCOPY WITH PROPOFOL;  Surgeon: Ronnette Juniper, MD;  Location: [facility];  Service: Gastroenterology;  Laterality: N/A;   DISTAL INTERPHALANGEAL JOINT FUSION Right 03/14/2015   Procedure: RIGHT LONG FINGER DISTAL INTERPHALANGEAL JOINT ARTHRODESIS;  Surgeon: Iran Planas, MD;  Location: [facility];  Service: Orthopedics;  Laterality: Right;   ESOPHAGOGASTRODUODENOSCOPY (EGD) WITH PROPOFOL N/A 09/29/2019   Procedure: ESOPHAGOGASTRODUODENOSCOPY (EGD) WITH PROPOFOL;  Surgeon: Ronnette Juniper, MD;  Location: [facility];  Service: Gastroenterology;  Laterality: N/A;   EYE SURGERY  1995   rk (laser surgery), semi cornea transplant, detacted retina,  fluid removal   HYSTEROSCOPY WITH D & C N/A 12/09/2020   Procedure: DILATATION AND CURETTAGE /HYSTEROSCOPY;  Surgeon: Drema Dallas, DO;  Location: [facility];  Service: Gynecology;  Laterality: N/A;   KNEE ARTHROSCOPY Left 03-03-2004   POLYPECTOMY  09/29/2019   Procedure: POLYPECTOMY;  Surgeon: Ronnette Juniper, MD;  Location: [facility];  Service: Gastroenterology;;   POSTERIOR VITRECTOMY RIGHT EYE AND LASER   10-27-1999   SHOULDER SURGERY Right 1997   SPINAL FIXATION  SURGERY W/ IMPLANT  2013 rod #1//  2014  rod 2   S1 -- T10  (rod #1)//   S1 -- T4 (rod #2)   THORACIC FUSION  03-13-2013   REMOVAL HARDWARE/  BONE GRAFT FUSION T10//  REVISION OF RODS   TOTAL KNEE ARTHROPLASTY Left 12-14-2005   UVULOPALATOPHARYNGOPLASTY  04-26-2006   w/  TONSILLECTOMY/  TURBINATE REDUCTIONS/  BILATERAL ANTERIOR ETHMOIDECTOMY    There were no vitals filed for this visit.   Subjective Assessment - 12/17/20 1412     Subjective The patient reports that she is feeling pretty good.  She has not done anything today though    How long can you stand comfortably? 15 minutes    How  long can you walk comfortably? 15 minutes    Currently in Pain? No/denies                [facility] PT Assessment - 12/17/20 0001       Assessment   Medical Diagnosis M79.18 (ICD-10-CM) - Myofascial pain syndrome of thoracic spine    Referring Provider (PT) Kirsteins, Luanna Salk, MD      Posture/Postural Control   Posture/Postural Control Postural limitations    Postural Limitations Rounded Shoulders;Forward head;Increased thoracic kyphosis      AROM   Right Shoulder Flexion 125 Degrees    Right Shoulder ABduction 130 Degrees    Right Shoulder Internal Rotation --   L1   Right Shoulder External Rotation --   T2   Left Shoulder Flexion 120 Degrees    Left Shoulder ABduction 135 Degrees    Left Shoulder Internal Rotation --   L1   Left Shoulder External Rotation --   T1                          [facility] Adult PT Treatment/Exercise - 12/17/20 0001       Neck Exercises: Seated   Neck Retraction 15 reps;3 secs      Shoulder Exercises: Standing   Flexion 10 reps    Flexion Limitations wall slides with lift offs      Shoulder Exercises: Stretch   Corner Stretch 20 seconds;2 reps    Corner Stretch Limitations mid pectoralis , high      Manual Therapy   Manual therapy comments Manual palpation and assessment during FDN    Soft tissue mobilization MTPR/ cross friction to TrP's in BIL mid traps/ rhomboids   Performed in sitting with elbows on knees due to pt preference             Trigger Point Dry Needling - 12/17/20 0001     Consent Given? Yes    Dry Needling Comments palpation and manual assessment during FDN    Other Dry Needling Patient educate on FDN benefits and risks prior to giving consent    Upper Trapezius Response Twitch reponse elicited;Palpable increased muscle length    Levator Scapulae Response Twitch response elicited    Rhomboids Response Palpable increased muscle length    Lower trapezius Response Palpable increased muscle length     Middle trapezius Response Palpable increased muscle length    Thoracic multifidi response Palpable increased muscle length                    PT Short Term Goals - 11/19/20 1432       PT SHORT TERM GOAL #1   Title The patient will be independent with a basic HEP  for the thoracic / scapular pain.    Baseline no HEP    Time 3    Period Weeks    Target Date 12/10/20               PT Long Term Goals - 12/17/20 1457       PT LONG TERM GOAL #1   Title The patient will be able to sit for 30-45 minutes playing cards with pain < or equal to 4/10.    Baseline 20 minutes    Time 8    Period Weeks      PT LONG TERM GOAL #2   Title The patient will be able to walk for 20-30 minutes for grocery shopping with pain < or equal to 4/10.    Baseline 15 minutes    Time 8    Period Weeks      PT LONG TERM GOAL #3   Title The patient have an improved FOTO score to 44% for functional home activities.    Baseline 36%    Time 8    Period Weeks      PT LONG TERM GOAL #4   Title The patient will present with improvement of bilateral shoulder flexion AROM to 120 - 125 degrees for functional reaching.    Baseline 110 to 115 degrees    Time 8    Period Weeks    Status Achieved                   Plan - 12/17/20 1411     Clinical Impression Statement The patient arrived reporting no current pain.  Her symptoms are improving overall.  She did have tightness in the thoracic paraspinals.  Added FDN to this area.  Continued with IASTM post the dry needling. Added high pectoralis stretch to the treatment plan.  The patient has one more session remaining.  The patient presents with improvement of bilateral shoulder active flexion to 120 to 125 degrees.  We will re-assess at that time for need to continue in therapy.    Personal Factors and Comorbidities Comorbidity 3+    Comorbidities Psoriatic arthritis, OCD, DDD lumbar and cervical (fusion T4-L2), PVC, fibromyalgia, restless  leg syndrome    Examination-Activity Limitations Bathing;Bed Mobility;Locomotion Level;Transfers;Reach Overhead;Dressing;Lift;Stand;Sleep;Carry;Caring for Others;Sit    Examination-Participation Restrictions Cleaning;Meal Prep;Driving;Shop;Laundry;[place] Work    PT Treatment/Interventions ADLs/Self Care Home Management;Electrical Stimulation;Gait training;Stair training;Functional mobility training;Therapeutic activities;Therapeutic exercise;Neuromuscular re-education;Manual techniques;Balance training;Patient/family education;Scar mobilization;Passive range of motion;Spinal Manipulations    PT Next Visit Plan FOTO next session, progress scapular training, postural training/strengthening, cervical stabilization, FDN    PT Home Exercise Plan Access Code: [email or web address]    Consulted and Agree with Plan of Care Patient             Patient will benefit from skilled therapeutic intervention in order to improve the following deficits and impairments:  Decreased range of motion, Difficulty walking, Decreased mobility, Decreased strength, Hypomobility, Improper body mechanics, Decreased activity tolerance, Pain  Visit Diagnosis: Pain in thoracic spine  Muscle weakness (generalized)  Abnormal posture     Problem List Patient Active Problem List   Diagnosis Date Noted   C. difficile colitis 09/29/2019   Colitis 09/28/2019   Anxiety and depression 09/28/2019   DDD (degenerative disc disease), cervical s/p fusion 11/12/2016   H/O total knee replacement, left 05/06/2016   DDD lumbar spine status post fusion 05/06/2016   Psoriasis 05/05/2016   High risk medication use 05/05/2016   Cervical post-laminectomy syndrome 12/09/2015  Thoracic postlaminectomy syndrome 12/09/2015   Psoriatic arthritis ([place]) 08/23/2012   Osteopenia 08/23/2012   HLD (hyperlipidemia) 08/23/2012   RLS (restless legs syndrome) 08/23/2012   Insomnia 08/23/2012   Fibromyalgia syndrome 08/12/2012   Low back pain 08/12/2012    Syncope    PVC (premature ventricular contraction)    OCD (obsessive compulsive disorder)    GERD (gastroesophageal reflux disease)    Hypertension    Rich Number, PT, DPT, OCS, Crt. DN  Bethena Midget 12/17/2020, 2:58 PM  [facility] [address] [place], [place], [address] Phone: [phone]   Fax:  [phone]  Name: SARGUN OLIVENCIA MRN: [id number] Date of Birth: Oct 19, 1950

## 2020-12-18 ENCOUNTER — Other Ambulatory Visit: Payer: Self-pay | Admitting: Nurse Practitioner

## 2020-12-18 NOTE — Telephone Encounter (Signed)
Ok to refill 

## 2020-12-19 ENCOUNTER — Ambulatory Visit: Payer: Medicare Other

## 2020-12-19 ENCOUNTER — Other Ambulatory Visit: Payer: Self-pay

## 2020-12-19 ENCOUNTER — Telehealth: Payer: Self-pay | Admitting: *Deleted

## 2020-12-19 DIAGNOSIS — M6281 Muscle weakness (generalized): Secondary | ICD-10-CM

## 2020-12-19 DIAGNOSIS — R293 Abnormal posture: Secondary | ICD-10-CM

## 2020-12-19 DIAGNOSIS — M654 Radial styloid tenosynovitis [de Quervain]: Secondary | ICD-10-CM | POA: Diagnosis not present

## 2020-12-19 DIAGNOSIS — M546 Pain in thoracic spine: Secondary | ICD-10-CM

## 2020-12-19 NOTE — Patient Instructions (Signed)
Access Code: [email or web address] URL: [email or web address] Date: 12/19/2020 Prepared by: Rich Number  Exercises Standing Cervical Retraction - 2 x daily - 7 x weekly - 1 sets - 15 reps - 3 hold Doorway Pec Stretch at 60 Elevation - 2 x daily - 7 x weekly - 1 sets - 3 reps - 20 hold Seated Cervical Retraction - 2 x daily - 7 x weekly - 1 sets - 10 reps - 3 hold Shoulder extension with resistance - Neutral - 2 x daily - 7 x weekly - 1 sets - 15 reps - 5 hold Shoulder External Rotation and Scapular Retraction with Resistance - 2 x daily - 7 x weekly - 1 sets - 15 reps - 5 hold Low Trap Setting at [place] 1 x daily - 7 x weekly - 1 sets - 15 reps

## 2020-12-19 NOTE — Telephone Encounter (Signed)
Received fax requesting Tier Exception for Gabapentin to lower cost.   Medication is not on current list.   Please advise.

## 2020-12-19 NOTE — Telephone Encounter (Signed)
Noted  

## 2020-12-19 NOTE — Therapy (Signed)
[place], [place], [address] Phone: [phone]   Fax:  [phone]  Physical Therapy Treatment / Discharge  Patient Details  Name: SHAKETHA JEON MRN: [id number] Date of Birth: 1950-07-02 Referring Provider (PT): Letta Pate Luanna Salk, MD  PHYSICAL THERAPY DISCHARGE SUMMARY  Visits from Start of Care: 11/19/20  Current functional level related to goals / functional outcomes: Limited with walking/standing to 15 minutes, Sitting to 25 minutes   Remaining deficits: Reduction of walking, standing, sitting tolerance, mild limits of shoulder strength, abnormal posture.   Education / Equipment: HEP   Patient agrees to discharge. Patient goals were partially met. Patient is being discharged due to being pleased with the current functional level.  Encounter Date: 12/19/2020   PT End of Session - 12/19/20 1414     Visit Number 6    Number of Visits 16    Date for PT Re-Evaluation 01/14/21    Authorization Type MCR    Authorization Time Period 6th visit FOTO, 10th visit FOTO / re-eval    PT Start Time 3893    PT Stop Time 1500    PT Time Calculation (min) 46 min    Activity Tolerance Patient tolerated treatment well    Behavior During Therapy WFL for tasks assessed/performed             Past Medical History:  Diagnosis Date   Ankylosing spondylitis ([place])    Anxiety and depression    Depression    Fibromyalgia    GERD (gastroesophageal reflux disease)    Hiatal hernia    per patient, dx by GI    History of basal cell carcinoma excision    FACE, 1992  &  1996   History of hiatal hernia    History of thrush    Hyperlipidemia    Hypertension    Insomnia    Left breast mass    OCD (obsessive compulsive disorder)    Osteopenia    PONV (postoperative nausea and vomiting)    Psoriatic arthritis ([facility])    PVC (premature ventricular contraction)    Rheumatoid arthritis ([place])     Past Surgical  History:  Procedure Laterality Date   BIOPSY  09/29/2019   Procedure: BIOPSY;  Surgeon: Ronnette Juniper, MD;  Location: [facility];  Service: Gastroenterology;;   BREAST LUMPECTOMY WITH RADIOACTIVE SEED LOCALIZATION Left 05/04/2018   Procedure: LEFT BREAST LUMPECTOMY WITH RADIOACTIVE SEED LOCALIZATION AND LEFT BREAST NIPPLE BIOPSY;  Surgeon: Jovita Kussmaul, MD;  Location: [place];  Service: General;  Laterality: Left;   BREAST SURGERY  1975   lumpectomy-- benign   BUNIONECTOMY  1991   CATARACT EXTRACTION W/ INTRAOCULAR LENS  IMPLANT, BILATERAL  2000   CERVICAL FUSION  March 2013   C5 -- C7   CESAREAN SECTION  1985   COLONOSCOPY WITH PROPOFOL N/A 09/29/2019   Procedure: COLONOSCOPY WITH PROPOFOL;  Surgeon: Ronnette Juniper, MD;  Location: [facility];  Service: Gastroenterology;  Laterality: N/A;   DISTAL INTERPHALANGEAL JOINT FUSION Right 03/14/2015   Procedure: RIGHT LONG FINGER DISTAL INTERPHALANGEAL JOINT ARTHRODESIS;  Surgeon: Iran Planas, MD;  Location: [place];  Service: Orthopedics;  Laterality: Right;   ESOPHAGOGASTRODUODENOSCOPY (EGD) WITH PROPOFOL N/A 09/29/2019   Procedure: ESOPHAGOGASTRODUODENOSCOPY (EGD) WITH PROPOFOL;  Surgeon: Ronnette Juniper, MD;  Location: [facility];  Service: Gastroenterology;  Laterality: N/A;   EYE SURGERY  1995   rk (laser surgery), semi cornea transplant, detacted retina,  fluid removal  HYSTEROSCOPY WITH D & C N/A 12/09/2020   Procedure: DILATATION AND CURETTAGE /HYSTEROSCOPY;  Surgeon: Drema Dallas, DO;  Location: [place];  Service: Gynecology;  Laterality: N/A;   KNEE ARTHROSCOPY Left 03-03-2004   POLYPECTOMY  09/29/2019   Procedure: POLYPECTOMY;  Surgeon: Ronnette Juniper, MD;  Location: [facility];  Service: Gastroenterology;;   POSTERIOR VITRECTOMY RIGHT EYE AND LASER   10-27-1999   SHOULDER SURGERY Right 1997   SPINAL FIXATION SURGERY W/ IMPLANT  2013 rod #1//  2014  rod 2   S1 -- T10  (rod #1)//   S1  -- T4 (rod #2)   THORACIC FUSION  03-13-2013   REMOVAL HARDWARE/  BONE GRAFT FUSION T10//  REVISION OF RODS   TOTAL KNEE ARTHROPLASTY Left 12-14-2005   UVULOPALATOPHARYNGOPLASTY  04-26-2006   w/  TONSILLECTOMY/  TURBINATE REDUCTIONS/  BILATERAL ANTERIOR ETHMOIDECTOMY    There were no vitals filed for this visit.   Subjective Assessment - 12/19/20 1417     Subjective The patient reports that she is feeling better still.    How long can you sit comfortably? 25 minutes    How long can you walk comfortably? e                [facility] PT Assessment - 12/19/20 0001       Assessment   Medical Diagnosis M79.18 (ICD-10-CM) - Myofascial pain syndrome of thoracic spine    Referring Provider (PT) Kirsteins, Luanna Salk, MD      Observation/Other Assessments   Focus on Therapeutic Outcomes (FOTO)  Back 52% ; Predicted 44%      AROM   Overall AROM Comments Excessive UT activation during shoulder AROM    Right Shoulder Flexion 125 Degrees    Right Shoulder ABduction 135 Degrees    Right Shoulder Internal Rotation --   L1   Right Shoulder External Rotation --   T2   Left Shoulder Flexion 130 Degrees    Left Shoulder ABduction 140 Degrees    Left Shoulder Internal Rotation --   L1   Left Shoulder External Rotation --   T1   Cervical Flexion 60    Cervical Extension 50    Cervical - Right Side Bend 30    Cervical - Left Side Bend 20    Cervical - Right Rotation 50    Cervical - Left Rotation 65      Strength   Overall Strength Comments Excessive UT activation during shoulder MMT    Right Shoulder Flexion 4-/5    Right Shoulder Extension 4+/5    Right Shoulder ABduction 4/5    Right Shoulder Internal Rotation 4+/5    Right Shoulder External Rotation 4+/5    Left Shoulder Flexion 4-/5    Left Shoulder Extension 4+/5    Left Shoulder ABduction 4/5    Left Shoulder Internal Rotation 4+/5    Left Shoulder External Rotation 4/5                           [facility] Adult  PT Treatment/Exercise - 12/19/20 0001       Neck Exercises: Theraband   Shoulder External Rotation 15 reps;Red      Shoulder Exercises: Standing   Flexion Both;15 reps    Flexion Limitations wall slides with lift offs      Shoulder Exercises: Stretch   Corner Stretch 20 seconds;2 reps    Corner Stretch Limitations mid pectoralis , high  Manual Therapy   Manual therapy comments Manual palpation and assessment during FDN    Soft tissue mobilization IASTM thoracic paraspinals and rhomboids              Trigger Point Dry Needling - 12/19/20 0001     Consent Given? Yes    Dry Needling Comments palpation and manual assessment during FDN    Other Dry Needling Patient educate on FDN benefits and risks prior to giving consent    Upper Trapezius Response Twitch reponse elicited;Palpable increased muscle length    Levator Scapulae Response Twitch response elicited    Rhomboids Response Palpable increased muscle length    Lower trapezius Response Palpable increased muscle length    Middle trapezius Response Palpable increased muscle length    Thoracic multifidi response Palpable increased muscle length                  PT Education - 12/19/20 1504     Education Details updated HEP and continuation of exercises.    Person(s) Educated Patient    Methods Explanation;Handout    Comprehension Verbalized understanding              PT Short Term Goals - 12/19/20 1442       PT SHORT TERM GOAL #1   Title The patient will be independent with a basic HEP for the thoracic / scapular pain.    Baseline no HEP    Time 3    Period Weeks    Status Achieved    Target Date 12/10/20               PT Long Term Goals - 12/19/20 1442       PT LONG TERM GOAL #1   Title The patient will be able to sit for 30-45 minutes playing cards with pain < or equal to 4/10.    Baseline 20 minutes    Time 8    Period Weeks    Status Partially Met      PT LONG TERM GOAL #2    Title The patient will be able to walk for 20-30 minutes for grocery shopping with pain < or equal to 4/10.    Baseline 15 minutes    Time 8    Period Weeks    Status Partially Met      PT LONG TERM GOAL #3   Title The patient have an improved FOTO score to 44% for functional home activities.    Baseline 36%    Time 8    Period Weeks    Status Achieved      PT LONG TERM GOAL #4   Title The patient will present with improvement of bilateral shoulder flexion AROM to 120 - 125 degrees for functional reaching.    Baseline 110 to 115 degrees    Time 8    Period Weeks    Status Achieved                   Plan - 12/19/20 1416     Clinical Impression Statement Eulalie has attended 6 visits of physical therapy including stretches, manual therapy, strengthening exercises, shoulder range of motion, and dry needling.  The patient reports that she is ready for this to be her last appointment.  The patient has met two of the long term goals and partially met two of the long term goals.  The short term goal was met.   The patient presents with improvement of  bilateral shoulder flexion motion and mild increase of shoulder strength.  Her neck active range of motion also improved with rotation improved to 65-50 degrees and extension to 50 degrees.  The patient will continue with her home exercise program at this time.    Personal Factors and Comorbidities Comorbidity 3+    Comorbidities Psoriatic arthritis, OCD, DDD lumbar and cervical (fusion T4-L2), PVC, fibromyalgia, restless leg syndrome    Examination-Activity Limitations Sit;Stand;Locomotion Level;Carry;Caring for Others;Lift    Examination-Participation Restrictions Shop;Yard Work;Laundry;Cleaning;Meal Prep    PT Next Visit Plan Discharge from [facility] at this time.    PT Home Exercise Plan Access Code: [email or web address]    [id number] and Agree with Plan of Care Patient             Patient will benefit from skilled therapeutic intervention in  order to improve the following deficits and impairments:     Visit Diagnosis: Pain in thoracic spine  Muscle weakness (generalized)  Abnormal posture     Problem List Patient Active Problem List   Diagnosis Date Noted   C. difficile colitis 09/29/2019   Colitis 09/28/2019   Anxiety and depression 09/28/2019   DDD (degenerative disc disease), cervical s/p fusion 11/12/2016   H/O total knee replacement, left 05/06/2016   DDD lumbar spine status post fusion 05/06/2016   Psoriasis 05/05/2016   High risk medication use 05/05/2016   Cervical post-laminectomy syndrome 12/09/2015   Thoracic postlaminectomy syndrome 12/09/2015   Psoriatic arthritis ([place]) 08/23/2012   Osteopenia 08/23/2012   HLD (hyperlipidemia) 08/23/2012   RLS (restless legs syndrome) 08/23/2012   Insomnia 08/23/2012   Fibromyalgia syndrome 08/12/2012   Low back pain 08/12/2012   Syncope    PVC (premature ventricular contraction)    OCD (obsessive compulsive disorder)    GERD (gastroesophageal reflux disease)    Hypertension    Rich Number, PT, DPT, OCS, Crt. DN  Bethena Midget 12/19/2020, 3:06 PM  [facility] [address] [place], [place], [address] Phone: [phone]   Fax:  [phone]  Name: JANNINE ABREU MRN: [id number] Date of Birth: 07/20/1950

## 2020-12-23 ENCOUNTER — Other Ambulatory Visit: Payer: Self-pay | Admitting: *Deleted

## 2020-12-23 MED ORDER — OXYCODONE-ACETAMINOPHEN 5-325 MG PO TABS: 1.0000 | ORAL_TABLET | Freq: Every day | ORAL | 0 refills | Status: DC

## 2020-12-23 NOTE — Telephone Encounter (Signed)
Received call from patient.   Requested refill on Oxycodone/APAP.   Ok to refill??  Last office visit 09/24/2020.  Last refill 10/25/2020.

## 2020-12-24 DIAGNOSIS — Z4889 Encounter for other specified surgical aftercare: Secondary | ICD-10-CM | POA: Diagnosis not present

## 2020-12-25 ENCOUNTER — Telehealth: Payer: Self-pay | Admitting: Diagnostic Neuroimaging

## 2020-12-25 NOTE — Telephone Encounter (Signed)
I called the pt and advised per Dr. Leta Baptist, ok to try 80 mg daily. Pt will increase to two of the 40 mg daily and I will work on getting the PA and dosage corrected with the supplier.

## 2020-12-25 NOTE — Telephone Encounter (Signed)
I called the pt. Pt reports she has been taking 40 mg of the Ingrezza daily since June and has done well. No s/e r/t to med, feels like her sx ( movement in toe,foot, and mouth) are the same.   Wanted to know if she could try 80 mg daily of the Ingrezza?  She received some literature in the mail about this dosage and would like to try it if the MD is agreeable?  I advised I would CB with Dr. Gladstone Lighter response. Pt agreeable to this plan.

## 2020-12-25 NOTE — Telephone Encounter (Signed)
I have filled out the updated treatment form for the pt reflecting she is taking the 80 mg dosage. Will have Dr. Leta Baptist sign and then fax to [facility] for processing.

## 2020-12-25 NOTE — Telephone Encounter (Signed)
Pt wants to know if her valbenazine (INGREZZA) 40 MG capsule can be increased because it is not effective.

## 2020-12-26 ENCOUNTER — Other Ambulatory Visit: Payer: Self-pay | Admitting: Family Medicine

## 2020-12-26 NOTE — Telephone Encounter (Addendum)
I have sent the updated RX form reflecting the increase to 80 mg daily to [facility] via fax.  Fax # [phone], confirmation received.   Will continue to update as information is received.

## 2021-01-03 ENCOUNTER — Other Ambulatory Visit: Payer: Self-pay | Admitting: Family Medicine

## 2021-01-06 ENCOUNTER — Telehealth: Payer: Self-pay | Admitting: Diagnostic Neuroimaging

## 2021-01-06 NOTE — Telephone Encounter (Signed)
Contacted pt back, she stated [facility] has not received Rx. Informed her that it was faxed to [facility] on 12/25/20.  Rx was refaxed today, receipt confirmed

## 2021-01-06 NOTE — Telephone Encounter (Signed)
Pt called stating she needs her valbenazine (INGREZZA) 40 MG capsule increased to 80 MG. Pt requesting a call back.

## 2021-01-13 ENCOUNTER — Telehealth: Payer: Self-pay | Admitting: Diagnostic Neuroimaging

## 2021-01-13 NOTE — Telephone Encounter (Signed)
Edward from [facility] called needing to speak to the RN regarding a possible

## 2021-01-15 ENCOUNTER — Telehealth: Payer: Self-pay | Admitting: Diagnostic Neuroimaging

## 2021-01-15 NOTE — Telephone Encounter (Signed)
Mark @ [facility] is asking for a call from RN to discuss possible drug interaction between [facility]) and [facility]).  When calling the toll free# it will take RN to the Ingrezza team/specialist

## 2021-01-15 NOTE — Telephone Encounter (Signed)
Per Dr Leta Baptist, [facility] for ingrezza '80mg'$ ; caution with side effects. patient should discuss with psychiatry to see if wellbutrin or prozac dosing can be reduced. Called pharmacy, spoke with Elta Guadeloupe and advised of Dr Ross [employer]. He stated they'll call her to set up overnight shipment of medication, verbalized understanding, appreciation. Called patient and advised her that she'll get a call from pharmacy today to ship medicine. Informed her of Dr Gladstone Lighter message, recommended she talk to her therapist about possible dose reduction. She's stated the wellbutrin is at a good level of treating her depression. The prozac is not helping her OCD much. I advised she be sure her therapist knows she is on [place]. Then reviewed possible side effects of ingrezza. She stated she has done well on medicine so far, agreed to let us know if she experiences any side effects. Patient verbalized understanding, appreciation.

## 2021-01-15 NOTE — Telephone Encounter (Signed)
Called pharmacy, spoke with pharmacist, Elta Guadeloupe who stated potential drug interaction with wellbutrin. Wellbutrin can increase levels of ingrezza in her system, like drowsiness, can increase chance of other less common side effects as well. I informed him that in her medical record it was last refilled 2018. I'll call her to verify if she is still taking it, then let him know. Mark verbalized understanding, appreciation. Called patient who verified she takes wellbutrin for depression. She stated ingrezza increased dose of 80 mg is helping with mouth movements, not as much with tongue movement yet. She ran out of medicine last Fri, called to get it overnight. She was told it'd arrive Tues but she didn't receive it. I advised her I'd call pharmacy and let her know status. Patient verbalized understanding, appreciation. Called pharmacy, spoke with Dorothea Ogle, pharmacist  and advised him she takes wellbutrin and prozac. He stated that increases risk of side effects even more. They have not shipped her med due to waiting  to hear MD reply to this information. Note sent to Dr Leta Baptist.

## 2021-01-20 ENCOUNTER — Other Ambulatory Visit: Payer: Self-pay | Admitting: Diagnostic Neuroimaging

## 2021-01-30 ENCOUNTER — Other Ambulatory Visit: Payer: Self-pay | Admitting: *Deleted

## 2021-01-30 DIAGNOSIS — Z79899 Other long term (current) drug therapy: Secondary | ICD-10-CM

## 2021-01-30 LAB — COMPLETE METABOLIC PANEL WITH GFR
AG Ratio: 1.6 (calc) (ref 1.0–2.5)
ALT: 6 U/L (ref 6–29)
AST: 14 U/L (ref 10–35)
Albumin: 4 g/dL (ref 3.6–5.1)
Alkaline phosphatase (APISO): 116 U/L (ref 37–153)
BUN: 11 mg/dL (ref 7–25)
CO2: 30 mmol/L (ref 20–32)
Calcium: 9.9 mg/dL (ref 8.6–10.4)
Chloride: 103 mmol/L (ref 98–110)
Creat: 0.73 mg/dL (ref 0.60–1.00)
Globulin: 2.5 g/dL (calc) (ref 1.9–3.7)
Glucose, Bld: 64 mg/dL — ABNORMAL LOW (ref 65–99)
Potassium: 4.2 mmol/L (ref 3.5–5.3)
Sodium: 140 mmol/L (ref 135–146)
Total Bilirubin: 0.3 mg/dL (ref 0.2–1.2)
Total Protein: 6.5 g/dL (ref 6.1–8.1)
eGFR: 88 mL/min/{1.73_m2} (ref >=60)

## 2021-01-30 LAB — CBC WITH DIFFERENTIAL/PLATELET
Absolute Monocytes: 722 cells/uL (ref 200–950)
Basophils Absolute: 78 cells/uL (ref 0–200)
Basophils Relative: 1.2 %
Eosinophils Absolute: 78 cells/uL (ref 15–500)
Eosinophils Relative: 1.2 %
HCT: 36.5 % (ref 35.0–45.0)
Hemoglobin: 11.7 g/dL (ref 11.7–15.5)
Lymphs Abs: 2535 cells/uL (ref 850–3900)
MCH: 30.7 pg (ref 27.0–33.0)
MCHC: 32.1 g/dL (ref 32.0–36.0)
MCV: 95.8 fL (ref 80.0–100.0)
MPV: 11.8 fL (ref 7.5–12.5)
Monocytes Relative: 11.1 %
Neutro Abs: 3088 cells/uL (ref 1500–7800)
Neutrophils Relative %: 47.5 %
Platelets: 226 10*3/uL (ref 140–400)
RBC: 3.81 10*6/uL (ref 3.80–5.10)
RDW: 13 % (ref 11.0–15.0)
Total Lymphocyte: 39 %
WBC: 6.5 10*3/uL (ref 3.8–10.8)

## 2021-01-31 ENCOUNTER — Ambulatory Visit (INDEPENDENT_AMBULATORY_CARE_PROVIDER_SITE_OTHER): Payer: Medicare Other | Admitting: Family Medicine

## 2021-01-31 ENCOUNTER — Other Ambulatory Visit: Payer: Self-pay

## 2021-01-31 VITALS — BP 150/98 | HR 94 | Temp 98.7°F | Resp 14 | Ht 64.0 in | Wt 196.0 lb

## 2021-01-31 DIAGNOSIS — D692 Other nonthrombocytopenic purpura: Secondary | ICD-10-CM

## 2021-01-31 DIAGNOSIS — L732 Hidradenitis suppurativa: Secondary | ICD-10-CM

## 2021-01-31 MED ORDER — HYDROCHLOROTHIAZIDE 25 MG PO TABS: 25.0000 mg | ORAL_TABLET | Freq: Every day | ORAL | 3 refills | Status: DC

## 2021-01-31 MED ORDER — TRIAMCINOLONE ACETONIDE 0.1 % EX CREA: 1.0000 "application " | TOPICAL_CREAM | Freq: Two times a day (BID) | CUTANEOUS | 0 refills | Status: DC

## 2021-01-31 MED ORDER — FLUTICASONE PROPIONATE 50 MCG/ACT NA SUSP: 2.0000 | Freq: Every day | NASAL | 11 refills | Status: DC | PRN

## 2021-01-31 MED ORDER — IPRATROPIUM BROMIDE 0.06 % NA SOLN: 2.0000 | Freq: Four times a day (QID) | NASAL | 12 refills | Status: DC

## 2021-01-31 MED ORDER — CYCLOBENZAPRINE HCL 10 MG PO TABS: 10.0000 mg | ORAL_TABLET | Freq: Three times a day (TID) | ORAL | 0 refills | Status: DC | PRN

## 2021-01-31 NOTE — Progress Notes (Signed)
Glucose was 64. Rest of CMP WNL.  CBC WNL.

## 2021-01-31 NOTE — Progress Notes (Signed)
Subjective:    Patient ID: Janet Peterson, female    DOB: March 19, 1951, 70 y.o.   MRN: [id number]  Patient presents with a purpura on her right shin and.  Is 1.5 cm in diameter.  It is well-circumscribed.  It itches.  Patient states that is been there for a few days.  She also complains of small boils that are recurrent in her pubic hair.  On examination today she has 2 areas on her mons pubis that appear to be ingrown hairs which are erythematous and have spontaneously ruptured and drained and are now scarring down and resolving.  Each of these areas is about 1 cm in diameter.  She states that they will go and come.  She is also wanting to try different muscle relaxer.  The tizanidine is no longer working.  She has tried methocarbamol with no relief.  She has not tried Flexeril  Past Medical History:  Diagnosis Date   Ankylosing spondylitis ([place])    Anxiety and depression    Depression    Fibromyalgia    GERD (gastroesophageal reflux disease)    Hiatal hernia    per patient, dx by GI    History of basal cell carcinoma excision    FACE, 1992  &  1996   History of hiatal hernia    History of thrush    Hyperlipidemia    Hypertension    Insomnia    Left breast mass    OCD (obsessive compulsive disorder)    Osteopenia    PONV (postoperative nausea and vomiting)    Psoriatic arthritis ([facility])    PVC (premature ventricular contraction)    Rheumatoid arthritis ([place])    Past Surgical History:  Procedure Laterality Date   BIOPSY  09/29/2019   Procedure: BIOPSY;  Surgeon: Ronnette Juniper, MD;  Location: [facility];  Service: Gastroenterology;;   BREAST LUMPECTOMY WITH RADIOACTIVE SEED LOCALIZATION Left 05/04/2018   Procedure: LEFT BREAST LUMPECTOMY WITH RADIOACTIVE SEED LOCALIZATION AND LEFT BREAST NIPPLE BIOPSY;  Surgeon: Jovita Kussmaul, MD;  Location: [facility];  Service: General;  Laterality: Left;   BREAST SURGERY  1975   lumpectomy-- benign   BUNIONECTOMY  1991   CATARACT  EXTRACTION W/ INTRAOCULAR LENS  IMPLANT, BILATERAL  2000   CERVICAL FUSION  March 2013   C5 -- C7   CESAREAN SECTION  1985   COLONOSCOPY WITH PROPOFOL N/A 09/29/2019   Procedure: COLONOSCOPY WITH PROPOFOL;  Surgeon: Ronnette Juniper, MD;  Location: [facility];  Service: Gastroenterology;  Laterality: N/A;   DISTAL INTERPHALANGEAL JOINT FUSION Right 03/14/2015   Procedure: RIGHT LONG FINGER DISTAL INTERPHALANGEAL JOINT ARTHRODESIS;  Surgeon: Iran Planas, MD;  Location: [facility];  Service: Orthopedics;  Laterality: Right;   ESOPHAGOGASTRODUODENOSCOPY (EGD) WITH PROPOFOL N/A 09/29/2019   Procedure: ESOPHAGOGASTRODUODENOSCOPY (EGD) WITH PROPOFOL;  Surgeon: Ronnette Juniper, MD;  Location: [facility];  Service: Gastroenterology;  Laterality: N/A;   EYE SURGERY  1995   rk (laser surgery), semi cornea transplant, detacted retina,  fluid removal   HYSTEROSCOPY WITH D & C N/A 12/09/2020   Procedure: DILATATION AND CURETTAGE /HYSTEROSCOPY;  Surgeon: Drema Dallas, DO;  Location: [place] [facility];  Service: Gynecology;  Laterality: N/A;   KNEE ARTHROSCOPY Left 03-03-2004   POLYPECTOMY  09/29/2019   Procedure: POLYPECTOMY;  Surgeon: Ronnette Juniper, MD;  Location: [facility];  Service: Gastroenterology;;   POSTERIOR VITRECTOMY RIGHT EYE AND LASER   10-27-1999   SHOULDER SURGERY Right 1997   SPINAL  FIXATION SURGERY W/ IMPLANT  2013 rod #1//  2014  rod 2   S1 -- T10  (rod #1)//   S1 -- T4 (rod #2)   THORACIC FUSION  03-13-2013   REMOVAL HARDWARE/  BONE GRAFT FUSION T10//  REVISION OF RODS   TOTAL KNEE ARTHROPLASTY Left 12-14-2005   UVULOPALATOPHARYNGOPLASTY  04-26-2006   w/  TONSILLECTOMY/  TURBINATE REDUCTIONS/  BILATERAL ANTERIOR ETHMOIDECTOMY   Current Outpatient Medications on File Prior to Visit  Medication Sig Dispense Refill   buPROPion (WELLBUTRIN XL) 300 MG 24 hr tablet Take 300 mg by mouth every morning.  1   Capsaicin-Menthol (SALONPAS GEL EX) Apply 1 application topically  daily as needed (pain).     clonazePAM (KLONOPIN) 1 MG tablet TAKE 1 TABLET BY MOUTH EVERY DAY AS NEEDED FOR ANXIETY (Patient taking differently: Take 1 mg by mouth daily as needed for anxiety.) 30 tablet 1   doxepin (SINEQUAN) 50 MG capsule Take 50 mg by mouth.     ezetimibe (ZETIA) 10 MG tablet TAKE 1 TABLET BY MOUTH EVERY DAY 90 tablet 3   FLUoxetine (PROZAC) 20 MG tablet Take 20 mg by mouth daily.     fluticasone (FLONASE) 50 MCG/ACT nasal spray Place 2 sprays into both nostrils daily as needed for allergies.      INGREZZA 80 MG capsule Take one capsule by mouth daily 30 capsule 5   leflunomide (ARAVA) 20 MG tablet TAKE 1 TABLET BY MOUTH EVERY DAY 90 tablet 0   metoprolol succinate (TOPROL-XL) 50 MG 24 hr tablet TAKE 1 TABLET BY MOUTH ONCE DAILY FOLLOWING A MEAL 90 tablet 3   omeprazole (PRILOSEC) 40 MG capsule TAKE 1 CAPSULE BY MOUTH EVERY MORNING AND AT BEDTIME 180 capsule 1   ondansetron (ZOFRAN) 4 MG tablet Take 4 mg by mouth 4 (four) times daily as needed for nausea.     oxyCODONE-acetaminophen (PERCOCET) 5-325 MG tablet Take 1 tablet by mouth at bedtime. 60 tablet 0   tiZANidine (ZANAFLEX) 4 MG tablet TAKE 1 TABLET BY MOUTH EVERY 6 HOURS AS NEEDED FOR MUSCLE SPASMS. 30 tablet 0   valsartan (DIOVAN) 320 MG tablet TAKE 1 TABLET BY MOUTH EVERY DAY 90 tablet 3   Vitamin D, Ergocalciferol, (DRISDOL) 1.25 MG (50000 UNIT) CAPS capsule TAKE 1 CAPSULE BY MOUTH EVERY 7 DAYS 5 capsule 5   No current facility-administered medications on file prior to visit.   Allergies  Allergen Reactions   Latex Rash    And Mouth sores Other reaction(s): rash Other reaction(s): rash   Amlodipine Besy-Benazepril Hcl Other (See Comments)    COUGH   Amoxicillin-Pot Clavulanate Diarrhea and Nausea And Vomiting    Other reaction(s): sick on stomach   Bextra [Valdecoxib] Diarrhea and Nausea And Vomiting    REFLUX   Bupropion     Other reaction(s): rash, hives, nausea, high BP , sores, Other reaction(s):  rash, hives, nausea, high BP , sores,   Chlorhexidine     rash Other reaction(s): rash all over body   Lipitor [Atorvastatin Calcium] Other (See Comments)    MYALGIAS   Sulfa Antibiotics Nausea And Vomiting    CRAMPING Other reaction(s): stomach hurts   Zocor [Simvastatin] Other (See Comments)    MYALGIA   Social History   Socioeconomic History   Marital status: Married    Spouse name: Daryl   Number of children: Not on file   Years of education: Not on file   Highest education level: Not on file  Occupational History  Comment: retired  Tobacco Use   Smoking status: Former    Packs/day: 0.50    Years: 10.00    Pack years: 5.00    Types: Cigarettes    Quit date: 03/07/1995    Years since quitting: 25.9   Smokeless tobacco: Never  Vaping Use   Vaping Use: Never used  Substance and Sexual Activity   Alcohol use: Yes    Comment: rare   Drug use: Never   Sexual activity: Not on file  Other Topics Concern   Not on file  Social History Narrative   Lives with husband   Social Determinants of Health   Financial Resource Strain: Low Risk    Difficulty of Paying Living Expenses: Not hard at all  Food Insecurity: No Food Insecurity   Worried About Charity fundraiser in the Last Year: Never true   [place] in the Last Year: Never true  Transportation Needs: No Transportation Needs   Lack of Transportation (Medical): No   Lack of Transportation (Non-Medical): No  Physical Activity: Inactive   Days of Exercise per Week: 0 days   Minutes of Exercise per Session: 0 min  Stress: No Stress Concern Present   Feeling of Stress : Not at all  Social Connections: Moderately Isolated   Frequency of Communication with Friends and Family: More than three times a week   Frequency of Social Gatherings with Friends and Family: Once a week   Attends Religious Services: Never   Marine scientist or Organizations: No   Attends Music therapist: Never    Marital Status: Married  Human resources officer Violence: Not At Risk   Fear of Current or Ex-Partner: No   Emotionally Abused: No   Physically Abused: No   Sexually Abused: No    Review of Systems  Gastrointestinal:  Positive for abdominal pain.  All other systems reviewed and are negative.     Objective:   Physical Exam Vitals reviewed.  Constitutional:      General: She is not in acute distress.    Appearance: Normal appearance. She is normal weight. She is not ill-appearing or toxic-appearing.  Cardiovascular:     Rate and Rhythm: Normal rate and regular rhythm.     Heart sounds: Normal heart sounds. No murmur heard.   No friction rub. No gallop.  Pulmonary:     Effort: Pulmonary effort is normal. No respiratory distress.     Breath sounds: Normal breath sounds. No wheezing, rhonchi or rales.  Abdominal:     General: Abdomen is flat. Bowel sounds are normal. There is no distension.     Palpations: Abdomen is soft.     Tenderness: There is no abdominal tenderness.  Musculoskeletal:     Right lower leg: No edema.     Left lower leg: No edema.  Neurological:     Mental Status: She is alert.          Assessment & Plan:  Purpura ([place])  Hidradenitis I think the rash in her pubic area may be hidradenitis suppurativa.  I recommended switching to Dial antibacterial soap.  If this does not work we can try topical clindamycin in that area as a preventative.  At the present time there is no role for systemic antibiotics or incision and drainage as the lesions have ruptured spontaneously and are healing.  I believe the lesion on her right shin is a 1.5 cm purpura.  I recommended that we monitor this and  see if it will resolve on its own over the next few days to week.  I did give the patient a prescription for Flexeril 10 mg p.o. every 8 hours as needed muscle spasms to be used in place of tizanidine

## 2021-02-11 ENCOUNTER — Other Ambulatory Visit: Payer: Self-pay

## 2021-02-11 ENCOUNTER — Encounter: Payer: Self-pay | Admitting: Physical Medicine & Rehabilitation

## 2021-02-11 ENCOUNTER — Encounter: Payer: Medicare Other | Attending: Physical Medicine & Rehabilitation | Admitting: Physical Medicine & Rehabilitation

## 2021-02-11 VITALS — BP 128/83 | HR 89 | Temp 98.2°F | Ht 64.0 in | Wt 200.0 lb

## 2021-02-11 DIAGNOSIS — M19031 Primary osteoarthritis, right wrist: Secondary | ICD-10-CM | POA: Insufficient documentation

## 2021-02-11 DIAGNOSIS — F329 Major depressive disorder, single episode, unspecified: Secondary | ICD-10-CM | POA: Diagnosis not present

## 2021-02-11 NOTE — Progress Notes (Signed)
70 year old female with base of his thumb pain only partially relieved by de Quervain's injection 2 months ago.  Pain interferes with activities, not a good candidate for nonsteroidal anti-inflammatories due to age  Right first carpometacarpal joint injection under ultrasound guidance Patient placed in a seated position area scanned with 15 Hz linear transducer.  Positioning of the radial nerve was noted.  Area marked prepped with Betadine skin wheal was raised with 0.5 mL of 1% lidocaine then a 25-gauge 1.5 inch needle was inserted under long axis ultrasound guidance.  Needle easily entered the joint space, 1 mL of solution containing 0.5 mL of Celestone 6 mg/mL as well as 0.5 mL lidocaine 1% Injectate was seen entering the joint space.  Patient tolerated procedure well, postprocedure instructions given

## 2021-02-12 ENCOUNTER — Other Ambulatory Visit: Payer: Self-pay | Admitting: Physician Assistant

## 2021-02-12 NOTE — Telephone Encounter (Signed)
Next Visit: 02/27/2021  Last Visit: 09/26/2020  Last Fill: 10/22/2020  DX: Psoriatic arthritis   Current Dose per office note 09/26/2020: Arava 20 mg 1 tablet by mouth daily.    Labs: 01/30/2021, Glucose was 64. Rest of CMP WNL.  CBC WNL.  Okay to refill Arava?

## 2021-02-14 NOTE — Progress Notes (Signed)
Office Visit Note  Patient: Janet Peterson             Date of Birth: 06-18-1950           MRN: [id number]             PCP: Susy Frizzle, MD Referring: Susy Frizzle, MD Visit Date: 02/27/2021 Occupation: @GUAROCC @  Subjective:  Right thumb pain   History of Present Illness: Janet Peterson is a 70 y.o. female with history of psoriatic arthritis, osteoarthritis, fibromyalgia, and DDD.  She is taking arava 20 mg 1 tablet by mouth daily.  She is tolerating Arava without any side effects.  She has not missed any doses of Arava recently.  She denies any recent psoriatic arthritis flares.  She has no active psoriasis at this time.  She denies any Achilles tendinitis or plantar fasciitis.  She is not experiencing any SI joint discomfort at this time.  She has been having increased pain in the right thumb.  She was evaluated at pain management and has had 2 injections with minimal relief.  She states the pain is constant and is currently an 8 out of 10.  She continues to have chronic thoracic pain and follows along closely with pain management.  She has tried physical therapy, TENS unit, heat, and dry needling with some relief.  She was recently started on Flexeril which has provided minimal relief. Patient reports that she has noticed significant improvement in her right knee joint pain after undergoing Visco gel injections in June/July 2022.  She denies any right knee joint swelling at this time.  She has not noticed any instability. She denies any recent infections.      Activities of Daily Living:  Patient reports morning stiffness for 1 hour.   Patient Reports nocturnal pain.  Difficulty dressing/grooming: Denies Difficulty climbing stairs: Reports Difficulty getting out of chair: Denies Difficulty using hands for taps, buttons, cutlery, and/or writing: Reports  Review of Systems  Constitutional:  Positive for fatigue.  HENT:  Negative for mouth sores, mouth dryness and nose dryness.    Eyes:  Negative for pain, itching, visual disturbance and dryness.  Respiratory:  Negative for cough, hemoptysis, shortness of breath and difficulty breathing.   Cardiovascular:  Negative for chest pain, palpitations, hypertension and swelling in legs/feet.  Gastrointestinal:  Negative for blood in stool, constipation and diarrhea.  Endocrine: Negative for increased urination.  Genitourinary:  Negative for difficulty urinating and painful urination.  Musculoskeletal:  Positive for joint pain, joint pain, myalgias, morning stiffness, muscle tenderness and myalgias. Negative for joint swelling and muscle weakness.  Skin:  Negative for color change, pallor, rash, hair loss, nodules/bumps, redness, skin tightness, ulcers and sensitivity to sunlight.  Allergic/Immunologic: Negative for susceptible to infections.  Neurological:  Positive for weakness. Negative for dizziness, numbness, headaches and memory loss.  Hematological:  Positive for bruising/bleeding tendency. Negative for swollen glands.  Psychiatric/Behavioral:  Negative for depressed mood, confusion and sleep disturbance. The patient is not nervous/anxious.    PMFS History:  Patient Active Problem List   Diagnosis Date Noted   C. difficile colitis 09/29/2019   Colitis 09/28/2019   Anxiety and depression 09/28/2019   DDD (degenerative disc disease), cervical s/p fusion 11/12/2016   H/O total knee replacement, left 05/06/2016   DDD lumbar spine status post fusion 05/06/2016   Psoriasis 05/05/2016   High risk medication use 05/05/2016   Cervical post-laminectomy syndrome 12/09/2015   Thoracic postlaminectomy syndrome 12/09/2015  Psoriatic arthritis ([place]) 08/23/2012   Osteopenia 08/23/2012   HLD (hyperlipidemia) 08/23/2012   RLS (restless legs syndrome) 08/23/2012   Insomnia 08/23/2012   Fibromyalgia syndrome 08/12/2012   Low back pain 08/12/2012   Syncope    PVC (premature ventricular contraction)    OCD (obsessive compulsive  disorder)    GERD (gastroesophageal reflux disease)    Hypertension     Past Medical History:  Diagnosis Date   Ankylosing spondylitis (HCC)    Anxiety and depression    Depression    Fibromyalgia    GERD (gastroesophageal reflux disease)    Hiatal hernia    per patient, dx by GI    History of basal cell carcinoma excision    FACE, 1992  &  1996   History of hiatal hernia    History of thrush    Hyperlipidemia    Hypertension    Insomnia    Left breast mass    OCD (obsessive compulsive disorder)    Osteopenia    PONV (postoperative nausea and vomiting)    Psoriatic arthritis (HCC)    PVC (premature ventricular contraction)    Rheumatoid arthritis ([place])     Family History  Problem Relation Age of Onset   Diabetes Mother    Heart disease Mother    Diabetes Father    Anuerysm Father    Diabetes Brother    Heart disease Brother    Diabetes Brother    Heart disease Brother    Past Surgical History:  Procedure Laterality Date   BIOPSY  09/29/2019   Procedure: BIOPSY;  Surgeon: Ronnette Juniper, MD;  Location: [facility];  Service: Gastroenterology;;   BREAST LUMPECTOMY WITH RADIOACTIVE SEED LOCALIZATION Left 05/04/2018   Procedure: LEFT BREAST LUMPECTOMY WITH RADIOACTIVE SEED LOCALIZATION AND LEFT BREAST NIPPLE BIOPSY;  Surgeon: Jovita Kussmaul, MD;  Location: [place];  Service: General;  Laterality: Left;   BREAST SURGERY  1975   lumpectomy-- benign   BUNIONECTOMY  1991   CATARACT EXTRACTION W/ INTRAOCULAR LENS  IMPLANT, BILATERAL  2000   CERVICAL FUSION  March 2013   C5 -- C7   CESAREAN SECTION  1985   COLONOSCOPY WITH PROPOFOL N/A 09/29/2019   Procedure: COLONOSCOPY WITH PROPOFOL;  Surgeon: Ronnette Juniper, MD;  Location: [facility];  Service: Gastroenterology;  Laterality: N/A;   DISTAL INTERPHALANGEAL JOINT FUSION Right 03/14/2015   Procedure: RIGHT LONG FINGER DISTAL INTERPHALANGEAL JOINT ARTHRODESIS;  Surgeon: Iran Planas, MD;  Location: [place];  Service: Orthopedics;  Laterality: Right;   ESOPHAGOGASTRODUODENOSCOPY (EGD) WITH PROPOFOL N/A 09/29/2019   Procedure: ESOPHAGOGASTRODUODENOSCOPY (EGD) WITH PROPOFOL;  Surgeon: Ronnette Juniper, MD;  Location: [facility];  Service: Gastroenterology;  Laterality: N/A;   EYE SURGERY  1995   rk (laser surgery), semi cornea transplant, detacted retina,  fluid removal   HYSTEROSCOPY WITH D & C N/A 12/09/2020   Procedure: DILATATION AND CURETTAGE /HYSTEROSCOPY;  Surgeon: Drema Dallas, DO;  Location: [place];  Service: Gynecology;  Laterality: N/A;   KNEE ARTHROSCOPY Left 03-03-2004   POLYPECTOMY  09/29/2019   Procedure: POLYPECTOMY;  Surgeon: Ronnette Juniper, MD;  Location: [facility];  Service: Gastroenterology;;   POSTERIOR VITRECTOMY RIGHT EYE AND LASER   10-27-1999   SHOULDER SURGERY Right 1997   SPINAL FIXATION SURGERY W/ IMPLANT  2013 rod #1//  2014  rod 2   S1 -- T10  (rod #1)//   S1 -- T4 (rod #2)   THORACIC FUSION  03-13-2013  REMOVAL HARDWARE/  BONE GRAFT FUSION T10//  REVISION OF RODS   TOTAL KNEE ARTHROPLASTY Left 12-14-2005   UVULOPALATOPHARYNGOPLASTY  04-26-2006   w/  TONSILLECTOMY/  TURBINATE REDUCTIONS/  BILATERAL ANTERIOR ETHMOIDECTOMY   Social History   Social History Narrative   Lives with husband   Immunization History  Administered Date(s) Administered   Fluad Quad(high Dose 65+) 01/26/2019, 03/05/2020   Influenza Whole 03/04/2012   Influenza, High Dose Seasonal PF 03/25/2018   Influenza,inj,Quad PF,6+ Mos 02/28/2013, 03/07/2014, 02/28/2015, 03/27/2016, 03/09/2017   Pneumococcal Conjugate-13 09/10/2016   Pneumococcal Polysaccharide-23 03/12/2006, 11/09/2011   Unspecified SARS-COV-2 Vaccination 08/07/2019, 08/28/2019, 03/08/2020     Objective: Vital Signs: BP 140/90 (BP Location: Left Arm, Patient Position: Sitting, Cuff Size: Normal)   Pulse 80   Ht 5\' 4"  (1.626 m)   Wt 202 lb 3.2 oz (91.7 kg)   BMI 34.71 kg/m    Physical  Exam Vitals and nursing note reviewed.  Constitutional:      Appearance: She is well-developed.  HENT:     Head: Normocephalic and atraumatic.  Eyes:     Conjunctiva/sclera: Conjunctivae normal.  Pulmonary:     Effort: Pulmonary effort is normal.  Abdominal:     Palpations: Abdomen is soft.  Musculoskeletal:     Cervical back: Normal range of motion.  Lymphadenopathy:     Cervical: No cervical adenopathy.  Skin:    General: Skin is warm and dry.     Capillary Refill: Capillary refill takes less than 2 seconds.  Neurological:     Mental Status: She is alert and oriented to person, place, and time.  Psychiatric:        Behavior: Behavior normal.     Musculoskeletal Exam: C-spine has limited ROM.  Thoracic spine pain.  Trapezius muscle tension and tenderness bilaterally.  No SI joint tenderness.  Shoulder joints, elbow joints, wrist joints, MCPs, PIPs, and DIPs have good ROM. PIP and DIP thickening consistent with OA of both hands.  Tenderness and prominence of right CMC joint.  Hip joints have good ROM.  Right knee has good ROM with no warmth or effusion. Left knee replacement has good ROM with no discomfort. Ankle joints have good ROM with no tenderness or joint swelling.  No evidence of achilles tendonitis or plantar fasciitis.   CDAI Exam: CDAI Score: -- Patient Global: --; Provider Global: -- Swollen: --; Tender: -- Joint Exam 02/27/2021   No joint exam has been documented for this visit   There is currently no information documented on the homunculus. Go to the Rheumatology activity and complete the homunculus joint exam.  Investigation: No additional findings.  Imaging: No results found.  Recent Labs: Lab Results  Component Value Date   WBC 6.5 01/30/2021   HGB 11.7 01/30/2021   PLT 226 01/30/2021   NA 140 01/30/2021   K 4.2 01/30/2021   CL 103 01/30/2021   CO2 30 01/30/2021   GLUCOSE 64 (L) 01/30/2021   BUN 11 01/30/2021   CREATININE 0.73 01/30/2021    BILITOT 0.3 01/30/2021   ALKPHOS 118 04/25/2020   AST 14 01/30/2021   ALT 6 01/30/2021   PROT 6.5 01/30/2021   ALBUMIN 3.5 04/25/2020   CALCIUM 9.9 01/30/2021   GFRAA 82 09/26/2020   QFTBGOLD Indeterminate 08/14/2016   QFTBGOLDPLUS Negative 12/22/2018    Speciality Comments: Simponi Aria every 8 weeks started Jan 2018  TB gold negative 08/19/16  Procedures:  No procedures performed Allergies: Latex, Amlodipine besy-benazepril hcl, Amoxicillin-pot clavulanate, Bextra [valdecoxib],  Bupropion, Chlorhexidine, Lipitor [atorvastatin calcium], Sulfa antibiotics, and Zocor [simvastatin]     Assessment / Plan:     Visit Diagnoses: Psoriatic arthritis ([place]): She has no synovitis or dactylitis on examination today.  She has not had any recent psoriatic arthritis flares.  She has no active psoriasis at this time.  No evidence of Achilles tendinitis or plantar fasciitis.  No SI joint tenderness upon palpation.  She presented today with increased pain in the right [facility] joint due to underlying osteoarthritis.  She was provided a right CMC joint brace as well as hand exercises.  Overall her psoriatic arthritis has been well controlled on Arava 20 mg 1 tablet daily.  She continues to tolerate Arava without any side effects and has not missed any doses recently.  She will continue on the current therapy.  She was advised to notify us if she develops increased joint pain or joint swelling.  She will follow-up in the office in 5 months.  Psoriasis: No active psoriasis at this time.   High risk medication use - Arava 20 mg 1 tablet by mouth daily.  Previously was on Simponi Aria IV infusion 2 mg/kg every 8 weeks-patient discontinued in March 2021 due to C. difficile.  CBC and CMP updated on 01/30/21.  She will be due to update lab work in December and every 3 months.  Standing orders for CBC and CMP are in place.   She has not had any recent infections.  We discussed the importance of holding [place] if she develops  signs or symptoms of an infection and to resume once the infection has completely cleared. She is planning on getting the annual influenza vaccination.  Primary osteoarthritis of both hands: PIP and DIP thickening consistent with osteoarthritis of both hands.  CMC joint thickening noted bilaterally.  Tenderness over the right [facility] joint on exam. She presents today with increased pain in the right [facility] joint.  She has had 2 right CMC cortisone injections performed at pain management with minimal relief.  Different treatment options were discussed today in detail.  She was given a prescription for a right CMC joint brace to try.  We also discussed the use of topical agents for pain relief.  She was given a handout of hand exercises to perform.  We discussed the importance of joint protection and muscle strengthening.  She was advised to notify us if her discomfort persists or worsens.  Primary osteoarthritis of right knee - Visco right knee  June/July 2022-She has noticed significant improvement in her right knee joint pain since undergoing Visco gel injections.  She has not had any pain or stiffness in her right knee at this time.  She is good range of motion with no discomfort.  No warmth or effusion was noted.  She was advised to notify us when and if she would like to reapply for Visco gel injections in the future.  H/O total knee replacement, left: Doing well.  She has good range of motion with no discomfort.  DDD (degenerative disc disease), cervical s/p fusion: She follows up closely with pain management.   DDD (degenerative disc disease), thoracic - Thoracic kyphosis-Chronic pain.  Midline spinal tenderness in the thoracic region noted.  She is also having trapezius muscle tension and tenderness bilaterally.  She has tried physical therapy, dry needling, TENS unit, and heat with minimal relief.  Discussed getting massages on a regular basis.  She plans on continuing to follow-up with pain  management.  DDD lumbar  spine status post fusion: Chronic pain and stiffness.  She has not experiencing any new or worsening of symptoms.  No symptoms of radiculopathy at this time.  Fibromyalgia: She experiences intermittent myalgias and muscle tenderness due to underlying fibromyalgia.  Her symptoms are exacerbated by weather changes.  She follows up closely with pain management.  She was recently started on Flexeril 10 mg 1 tablet 3 times daily as needed for muscle spasms.  She has not noticed much improvement in her symptoms since starting on Flexeril.  She continues to take Percocet at bedtime for pain relief.  Discussed the importance of regular exercise and good sleep hygiene.  Other fatigue: Chronic and secondary to insomnia.  Discussed the importance of regular exercise.  Primary insomnia: She continues to experience interrupted sleep at night due to nocturnal pain.  Discussed the importance of good sleep hygiene.  Other medical conditions are listed as follows:   History of migraine  History of gastroesophageal reflux (GERD)  History of hypertension: BP was 140/90 today in the office.   History of hyperlipidemia  History of OCD (obsessive compulsive disorder)  RLS (restless legs syndrome)  Orders: No orders of the defined types were placed in this encounter.  No orders of the defined types were placed in this encounter.     Follow-Up Instructions: Return in about 5 months (around 07/28/2021) for Psoriatic arthritis, Osteoarthritis, Fibromyalgia, DDD.   Ofilia Neas, PA-C  Note - This record has been created using Dragon software.  Chart creation errors have been sought, but may not always  have been located. Such creation errors do not reflect on  the standard of medical care.

## 2021-02-17 ENCOUNTER — Other Ambulatory Visit: Payer: Self-pay | Admitting: *Deleted

## 2021-02-17 DIAGNOSIS — L821 Other seborrheic keratosis: Secondary | ICD-10-CM | POA: Diagnosis not present

## 2021-02-17 DIAGNOSIS — L820 Inflamed seborrheic keratosis: Secondary | ICD-10-CM | POA: Diagnosis not present

## 2021-02-17 DIAGNOSIS — Z85828 Personal history of other malignant neoplasm of skin: Secondary | ICD-10-CM | POA: Diagnosis not present

## 2021-02-17 NOTE — Telephone Encounter (Signed)
Ok to refill??  Last office visit 01/31/2021.  Last refill 12/23/2020.

## 2021-02-18 MED ORDER — OXYCODONE-ACETAMINOPHEN 5-325 MG PO TABS: 1.0000 | ORAL_TABLET | Freq: Every day | ORAL | 0 refills | Status: DC

## 2021-02-21 ENCOUNTER — Telehealth: Payer: Self-pay | Admitting: Family Medicine

## 2021-02-21 NOTE — Chronic Care Management (AMB) (Signed)
  Chronic Care Management   Note  02/21/2021 Name: Janet Peterson MRN: [id number] DOB: [birth year]  Janet Peterson is a 70 y.o. year old female who is a primary care patient of Susy Frizzle, MD. I reached out to Janet Peterson by phone today in response to a referral sent by Janet Peterson's PCP, Susy Frizzle, MD.   Janet Peterson was given information about Chronic Care Management services today including:  CCM service includes personalized support from designated clinical staff supervised by her physician, including individualized plan of care and coordination with other care providers 24/7 contact phone numbers for assistance for urgent and routine care needs. Service will only be billed when office clinical staff spend 20 minutes or more in a month to coordinate care. Only one practitioner may furnish and bill the service in a calendar month. The patient may stop CCM services at any time (effective at the end of the month) by phone call to the office staff.   Patient agreed to services and verbal consent obtained.   Follow up plan:   Janet Peterson

## 2021-02-27 ENCOUNTER — Encounter: Payer: Self-pay | Admitting: Physician Assistant

## 2021-02-27 ENCOUNTER — Ambulatory Visit (INDEPENDENT_AMBULATORY_CARE_PROVIDER_SITE_OTHER): Payer: Medicare Other | Admitting: Physician Assistant

## 2021-02-27 ENCOUNTER — Other Ambulatory Visit: Payer: Self-pay

## 2021-02-27 VITALS — BP 140/90 | HR 80 | Ht 64.0 in | Wt 202.2 lb

## 2021-02-27 DIAGNOSIS — M1711 Unilateral primary osteoarthritis, right knee: Secondary | ICD-10-CM

## 2021-02-27 DIAGNOSIS — Z79899 Other long term (current) drug therapy: Secondary | ICD-10-CM | POA: Diagnosis not present

## 2021-02-27 DIAGNOSIS — M19041 Primary osteoarthritis, right hand: Secondary | ICD-10-CM | POA: Diagnosis not present

## 2021-02-27 DIAGNOSIS — M797 Fibromyalgia: Secondary | ICD-10-CM

## 2021-02-27 DIAGNOSIS — M5134 Other intervertebral disc degeneration, thoracic region: Secondary | ICD-10-CM

## 2021-02-27 DIAGNOSIS — L4050 Arthropathic psoriasis, unspecified: Secondary | ICD-10-CM | POA: Diagnosis not present

## 2021-02-27 DIAGNOSIS — Z8639 Personal history of other endocrine, nutritional and metabolic disease: Secondary | ICD-10-CM

## 2021-02-27 DIAGNOSIS — F5101 Primary insomnia: Secondary | ICD-10-CM | POA: Diagnosis not present

## 2021-02-27 DIAGNOSIS — R5383 Other fatigue: Secondary | ICD-10-CM | POA: Diagnosis not present

## 2021-02-27 DIAGNOSIS — Z8719 Personal history of other diseases of the digestive system: Secondary | ICD-10-CM

## 2021-02-27 DIAGNOSIS — Z8659 Personal history of other mental and behavioral disorders: Secondary | ICD-10-CM

## 2021-02-27 DIAGNOSIS — L409 Psoriasis, unspecified: Secondary | ICD-10-CM

## 2021-02-27 DIAGNOSIS — Z96652 Presence of left artificial knee joint: Secondary | ICD-10-CM | POA: Diagnosis not present

## 2021-02-27 DIAGNOSIS — Z8669 Personal history of other diseases of the nervous system and sense organs: Secondary | ICD-10-CM

## 2021-02-27 DIAGNOSIS — G2581 Restless legs syndrome: Secondary | ICD-10-CM

## 2021-02-27 DIAGNOSIS — M47816 Spondylosis without myelopathy or radiculopathy, lumbar region: Secondary | ICD-10-CM | POA: Diagnosis not present

## 2021-02-27 DIAGNOSIS — M5030 Other cervical disc degeneration, unspecified cervical region: Secondary | ICD-10-CM

## 2021-02-27 DIAGNOSIS — M19042 Primary osteoarthritis, left hand: Secondary | ICD-10-CM

## 2021-02-27 DIAGNOSIS — Z8679 Personal history of other diseases of the circulatory system: Secondary | ICD-10-CM

## 2021-02-27 NOTE — Patient Instructions (Addendum)
CMC joint brace    Hand Exercises Hand exercises can be helpful for almost anyone. These exercises can strengthen the hands, improve flexibility and movement, and increase blood flow to the hands. These results can make work and daily tasks easier. Hand exercises can be especially helpful for people who have joint pain from arthritis or have nerve damage from overuse (carpal tunnel syndrome). These exercises can also help people who have injured a hand. Exercises Most of these hand exercises are gentle stretching and motion exercises. It is usually safe to do them often throughout the day. Warming up your hands before exercise may help to reduce stiffness. You can do this with gentle massage or by placing your hands in warm water for 10-15 minutes. It is normal to feel some stretching, pulling, tightness, or mild discomfort as you begin new exercises. This will gradually improve. Stop an exercise right away if you feel sudden, severe pain or your pain gets worse. Ask your health care provider which exercises are best for you. Knuckle bend or "claw" fist  Stand or sit with your arm, hand, and all five fingers pointed straight up. Make sure to keep your wrist straight during the exercise. Gently bend your fingers down toward your palm until the tips of your fingers are touching the top of your palm. Keep your big knuckle straight and just bend the small knuckles in your fingers. Hold this position for __________ seconds. Straighten (extend) your fingers back to the starting position. Repeat this exercise 5-10 times with each hand. Full finger fist  Stand or sit with your arm, hand, and all five fingers pointed straight up. Make sure to keep your wrist straight during the exercise. Gently bend your fingers into your palm until the tips of your fingers are touching the middle of your palm. Hold this position for __________ seconds. Extend your fingers back to the starting position, stretching every  joint fully. Repeat this exercise 5-10 times with each hand. Straight fist Stand or sit with your arm, hand, and all five fingers pointed straight up. Make sure to keep your wrist straight during the exercise. Gently bend your fingers at the big knuckle, where your fingers meet your hand, and the middle knuckle. Keep the knuckle at the tips of your fingers straight and try to touch the bottom of your palm. Hold this position for __________ seconds. Extend your fingers back to the starting position, stretching every joint fully. Repeat this exercise 5-10 times with each hand. Tabletop  Stand or sit with your arm, hand, and all five fingers pointed straight up. Make sure to keep your wrist straight during the exercise. Gently bend your fingers at the big knuckle, where your fingers meet your hand, as far down as you can while keeping the small knuckles in your fingers straight. Think of forming a tabletop with your fingers. Hold this position for __________ seconds. Extend your fingers back to the starting position, stretching every joint fully. Repeat this exercise 5-10 times with each hand. Finger spread  Place your hand flat on a table with your palm facing down. Make sure your wrist stays straight as you do this exercise. Spread your fingers and thumb apart from each other as far as you can until you feel a gentle stretch. Hold this position for __________ seconds. Bring your fingers and thumb tight together again. Hold this position for __________ seconds. Repeat this exercise 5-10 times with each hand. Making circles  Stand or sit with your arm,  hand, and all five fingers pointed straight up. Make sure to keep your wrist straight during the exercise. Make a circle by touching the tip of your thumb to the tip of your index finger. Hold for __________ seconds. Then open your hand wide. Repeat this motion with your thumb and each finger on your hand. Repeat this exercise 5-10 times with  each hand. Thumb motion  Sit with your forearm resting on a table and your wrist straight. Your thumb should be facing up toward the ceiling. Keep your fingers relaxed as you move your thumb. Lift your thumb up as high as you can toward the ceiling. Hold for __________ seconds. Bend your thumb across your palm as far as you can, reaching the tip of your thumb for the small finger (pinkie) side of your palm. Hold for __________ seconds. Repeat this exercise 5-10 times with each hand. Grip strengthening  Hold a stress ball or other soft ball in the middle of your hand. Slowly increase the pressure, squeezing the ball as much as you can without causing pain. Think of bringing the tips of your fingers into the middle of your palm. All of your finger joints should bend when doing this exercise. Hold your squeeze for __________ seconds, then relax. Repeat this exercise 5-10 times with each hand. Contact a health care provider if: Your hand pain or discomfort gets much worse when you do an exercise. Your hand pain or discomfort does not improve within 2 hours after you exercise. If you have any of these problems, stop doing these exercises right away. Do not do them again unless your health care provider says that you can. Get help right away if: You develop sudden, severe hand pain or swelling. If this happens, stop doing these exercises right away. Do not do them again unless your health care provider says that you can. This information is not intended to replace advice given to you by your health care provider. Make sure you discuss any questions you have with your health care provider. Document Revised: 08/29/2020 Document Reviewed: 08/29/2020 Elsevier Patient Education  [place] We placed an order today for your standing lab work.   Please have your standing labs drawn in December  and every 3 months   If possible, please have your labs drawn 2 weeks prior to  your appointment so that the provider can discuss your results at your appointment.  Please note that you may see your imaging and lab results in [facility] before we have reviewed them. We may be awaiting multiple results to interpret others before contacting you. Please allow our office up to 72 hours to thoroughly review all of the results before contacting the office for clarification of your results.  We have open lab daily: Monday through Thursday from 1:30-4:30 PM and Friday from 1:30-4:00 PM at the office of Dr. Bo Merino, [facility].   Please be advised, all patients with office appointments requiring lab work will take precedent over walk-in lab work.  If possible, please come for your lab work on Monday and Friday afternoons, as you may experience shorter wait times. The office is located at [address] [place], [place], [place] [address] No appointment is necessary.   Labs are drawn by [facility]. Please bring your co-pay at the time of your lab draw.  You may receive a bill from [facility] for your lab work.  If you wish to have your labs drawn at another  location, please call the office 24 hours in advance to send orders.  If you have any questions regarding directions or hours of operation,  please call [phone].   As a reminder, please drink plenty of water prior to coming for your lab work. Thanks!

## 2021-03-11 ENCOUNTER — Other Ambulatory Visit: Payer: Self-pay

## 2021-03-11 ENCOUNTER — Encounter: Payer: Medicare Other | Attending: Physical Medicine & Rehabilitation | Admitting: Physical Medicine & Rehabilitation

## 2021-03-11 ENCOUNTER — Encounter: Payer: Self-pay | Admitting: Physical Medicine & Rehabilitation

## 2021-03-11 VITALS — BP 140/80 | HR 77 | Temp 98.1°F | Ht 64.0 in | Wt 202.4 lb

## 2021-03-11 DIAGNOSIS — G243 Spasmodic torticollis: Secondary | ICD-10-CM | POA: Insufficient documentation

## 2021-03-11 DIAGNOSIS — Z23 Encounter for immunization: Secondary | ICD-10-CM | POA: Diagnosis not present

## 2021-03-11 NOTE — Progress Notes (Signed)
Subjective:    Patient ID: Janet Peterson, female    DOB: 1950-07-14, 70 y.o.   MRN: [id number]  HPI  DeQuervains injection helpful for Right wrist pain  Neck pain L>R , no pain or weakness in arms, no numbness in hands Occ RIght foot numbness mainly when lying in bed Hx C5-7 fusion, Last CT of C spine 2015 showing facet arthritis Right C3-4 Limited ROM looking to Right side  The patient has a past medical history of cervical dystonia and had good injections after cervical muscle botulinum toxin injections in 2017. Muscles and dosing: Left trap 75U Left splenius cap 75U Left semispinalis capitus 50U  The patient did have some excess neck weakness following the injection and follow-up appointments with MD recommended lowering dosing for subsequent injections however she never came back for additional injections.   Uses ibuprofen very occasionally and works ok but causes reflux Pain Inventory Average Pain 6 Pain Right Now 7 My pain is intermittent, sharp, burning, stabbing, and aching  In the last 24 hours, has pain interfered with the following? General activity 10 Relation with others 5 Enjoyment of life 10 What TIME of day is your pain at its worst? daytime and evening Sleep (in general) Fair  Pain is worse with: walking, bending, standing, and some activites Pain improves with: rest, medication, and injections Relief from Meds: 7  Family History  Problem Relation Age of Onset   Diabetes Mother    Heart disease Mother    Diabetes Father    Anuerysm Father    Diabetes Brother    Heart disease Brother    Diabetes Brother    Heart disease Brother    Social History   Socioeconomic History   Marital status: Married    Spouse name: Daryl   Number of children: Not on file   Years of education: Not on file   Highest education level: Not on file  Occupational History    Comment: retired  Tobacco Use   Smoking status: Former    Packs/day: 0.50    Years: 10.00     Pack years: 5.00    Types: Cigarettes    Quit date: 03/07/1995    Years since quitting: 26.0   Smokeless tobacco: Never  Vaping Use   Vaping Use: Never used  Substance and Sexual Activity   Alcohol use: Yes    Comment: rare   Drug use: Never   Sexual activity: Not on file  Other Topics Concern   Not on file  Social History Narrative   Lives with husband   Social Determinants of Health   Financial Resource Strain: Low Risk    Difficulty of Paying Living Expenses: Not hard at all  Food Insecurity: No Food Insecurity   Worried About Charity fundraiser in the Last Year: Never true   [place] in the Last Year: Never true  Transportation Needs: No Transportation Needs   Lack of Transportation (Medical): No   Lack of Transportation (Non-Medical): No  Physical Activity: Inactive   Days of Exercise per Week: 0 days   Minutes of Exercise per Session: 0 min  Stress: No Stress Concern Present   Feeling of Stress : Not at all  Social Connections: Moderately Isolated   Frequency of Communication with Friends and Family: More than three times a week   Frequency of Social Gatherings with Friends and Family: Once a week   Attends Religious Services: Never   Retail buyer of [employer]  or Organizations: No   Attends Archivist Meetings: Never   Marital Status: Married   Past Surgical History:  Procedure Laterality Date   BIOPSY  09/29/2019   Procedure: BIOPSY;  Surgeon: Ronnette Juniper, MD;  Location: [facility];  Service: Gastroenterology;;   BREAST LUMPECTOMY WITH RADIOACTIVE SEED LOCALIZATION Left 05/04/2018   Procedure: LEFT BREAST LUMPECTOMY WITH RADIOACTIVE SEED LOCALIZATION AND LEFT BREAST NIPPLE BIOPSY;  Surgeon: Jovita Kussmaul, MD;  Location: [place];  Service: General;  Laterality: Left;   BREAST SURGERY  1975   lumpectomy-- benign   BUNIONECTOMY  1991   CATARACT EXTRACTION W/ INTRAOCULAR LENS  IMPLANT, BILATERAL  2000   CERVICAL FUSION  March 2013    C5 -- C7   CESAREAN SECTION  1985   COLONOSCOPY WITH PROPOFOL N/A 09/29/2019   Procedure: COLONOSCOPY WITH PROPOFOL;  Surgeon: Ronnette Juniper, MD;  Location: [facility];  Service: Gastroenterology;  Laterality: N/A;   DISTAL INTERPHALANGEAL JOINT FUSION Right 03/14/2015   Procedure: RIGHT LONG FINGER DISTAL INTERPHALANGEAL JOINT ARTHRODESIS;  Surgeon: Iran Planas, MD;  Location: [place];  Service: Orthopedics;  Laterality: Right;   ESOPHAGOGASTRODUODENOSCOPY (EGD) WITH PROPOFOL N/A 09/29/2019   Procedure: ESOPHAGOGASTRODUODENOSCOPY (EGD) WITH PROPOFOL;  Surgeon: Ronnette Juniper, MD;  Location: [facility];  Service: Gastroenterology;  Laterality: N/A;   EYE SURGERY  1995   rk (laser surgery), semi cornea transplant, detacted retina,  fluid removal   HYSTEROSCOPY WITH D & C N/A 12/09/2020   Procedure: DILATATION AND CURETTAGE /HYSTEROSCOPY;  Surgeon: Drema Dallas, DO;  Location: [place];  Service: Gynecology;  Laterality: N/A;   KNEE ARTHROSCOPY Left 03-03-2004   POLYPECTOMY  09/29/2019   Procedure: POLYPECTOMY;  Surgeon: Ronnette Juniper, MD;  Location: [facility];  Service: Gastroenterology;;   POSTERIOR VITRECTOMY RIGHT EYE AND LASER   10-27-1999   SHOULDER SURGERY Right 1997   SPINAL FIXATION SURGERY W/ IMPLANT  2013 rod #1//  2014  rod 2   S1 -- T10  (rod #1)//   S1 -- T4 (rod #2)   THORACIC FUSION  03-13-2013   REMOVAL HARDWARE/  BONE GRAFT FUSION T10//  REVISION OF RODS   TOTAL KNEE ARTHROPLASTY Left 12-14-2005   UVULOPALATOPHARYNGOPLASTY  04-26-2006   w/  TONSILLECTOMY/  TURBINATE REDUCTIONS/  BILATERAL ANTERIOR ETHMOIDECTOMY   Past Surgical History:  Procedure Laterality Date   BIOPSY  09/29/2019   Procedure: BIOPSY;  Surgeon: Ronnette Juniper, MD;  Location: [facility];  Service: Gastroenterology;;   BREAST LUMPECTOMY WITH RADIOACTIVE SEED LOCALIZATION Left 05/04/2018   Procedure: LEFT BREAST LUMPECTOMY WITH RADIOACTIVE SEED LOCALIZATION AND LEFT BREAST  NIPPLE BIOPSY;  Surgeon: Jovita Kussmaul, MD;  Location: [place];  Service: General;  Laterality: Left;   BREAST SURGERY  1975   lumpectomy-- benign   BUNIONECTOMY  1991   CATARACT EXTRACTION W/ INTRAOCULAR LENS  IMPLANT, BILATERAL  2000   CERVICAL FUSION  March 2013   C5 -- C7   CESAREAN SECTION  1985   COLONOSCOPY WITH PROPOFOL N/A 09/29/2019   Procedure: COLONOSCOPY WITH PROPOFOL;  Surgeon: Ronnette Juniper, MD;  Location: [facility];  Service: Gastroenterology;  Laterality: N/A;   DISTAL INTERPHALANGEAL JOINT FUSION Right 03/14/2015   Procedure: RIGHT LONG FINGER DISTAL INTERPHALANGEAL JOINT ARTHRODESIS;  Surgeon: Iran Planas, MD;  Location: [place];  Service: Orthopedics;  Laterality: Right;   ESOPHAGOGASTRODUODENOSCOPY (EGD) WITH PROPOFOL N/A 09/29/2019   Procedure: ESOPHAGOGASTRODUODENOSCOPY (EGD) WITH PROPOFOL;  Surgeon: Ronnette Juniper, MD;  Location: [facility];  Service: Gastroenterology;  Laterality: N/A;   EYE SURGERY  1995   rk (laser surgery), semi cornea transplant, detacted retina,  fluid removal   HYSTEROSCOPY WITH D & C N/A 12/09/2020   Procedure: DILATATION AND CURETTAGE /HYSTEROSCOPY;  Surgeon: Drema Dallas, DO;  Location: [place];  Service: Gynecology;  Laterality: N/A;   KNEE ARTHROSCOPY Left 03-03-2004   POLYPECTOMY  09/29/2019   Procedure: POLYPECTOMY;  Surgeon: Ronnette Juniper, MD;  Location: [facility];  Service: Gastroenterology;;   POSTERIOR VITRECTOMY RIGHT EYE AND LASER   10-27-1999   SHOULDER SURGERY Right 1997   SPINAL FIXATION SURGERY W/ IMPLANT  2013 rod #1//  2014  rod 2   S1 -- T10  (rod #1)//   S1 -- T4 (rod #2)   THORACIC FUSION  03-13-2013   REMOVAL HARDWARE/  BONE GRAFT FUSION T10//  REVISION OF RODS   TOTAL KNEE ARTHROPLASTY Left 12-14-2005   UVULOPALATOPHARYNGOPLASTY  04-26-2006   w/  TONSILLECTOMY/  TURBINATE REDUCTIONS/  BILATERAL ANTERIOR ETHMOIDECTOMY   Past Medical History:  Diagnosis Date    Ankylosing spondylitis ([place])    Anxiety and depression    Depression    Fibromyalgia    GERD (gastroesophageal reflux disease)    Hiatal hernia    per patient, dx by GI    History of basal cell carcinoma excision    FACE, 1992  &  1996   History of hiatal hernia    History of thrush    Hyperlipidemia    Hypertension    Insomnia    Left breast mass    OCD (obsessive compulsive disorder)    Osteopenia    PONV (postoperative nausea and vomiting)    Psoriatic arthritis (HCC)    PVC (premature ventricular contraction)    Rheumatoid arthritis (HCC)    BP 140/80   Pulse 77   Temp 98.1 F (36.7 C) (Oral)   Ht 5\' 4"  (1.626 m)   Wt 202 lb 6.4 oz (91.8 kg)   SpO2 96%   BMI 34.74 kg/m   Opioid Risk Score:   Fall Risk Score:  `1  Depression screen PHQ 2/9  Depression screen [facility] 2/9 03/11/2021 02/11/2021 11/05/2020 10/10/2020 09/24/2020 06/25/2020 05/10/2017  Decreased Interest 0 1 1 0 0 0 0  Down, Depressed, Hopeless 0 1 1 0 0 0 0  PHQ - 2 Score 0 2 2 0 0 0 0  Altered sleeping - - 1 0 0 - -  Tired, decreased energy - - 0 0 0 - -  Change in appetite - - 0 0 0 - -  Feeling bad or failure about yourself  - - 0 0 0 - -  Trouble concentrating - - 0 0 0 - -  Moving slowly or fidgety/restless - - 0 0 0 - -  Suicidal thoughts - - 0 0 0 - -  PHQ-9 Score - - 3 0 0 - -  Difficult doing work/chores - - Not difficult at all Not difficult at all Not difficult at all - -  Some recent data might be hidden     Review of Systems  Musculoskeletal:  Positive for neck pain.       Shoulder blade pain  All other systems reviewed and are negative.     Objective:   Physical Exam Vitals and nursing note reviewed.  Constitutional:      Appearance: She is obese.  HENT:     Head: Normocephalic and atraumatic.  Eyes:     Extraocular Movements: Extraocular movements intact.     Conjunctiva/sclera: Conjunctivae normal.     Pupils: Pupils are equal, round, and reactive to light.  Neck:      Comments: Patient with 75% range cervical flexion and extension there is 50% range with rotation to the right and 75% range with rotation to the left.  There is also 50% range lateral flexion to the right and 75% lateral flexion to the left. There is tenderness palpation bilateral upper traps as well as bilateral upper medial scapular borders. Neurological:     General: No focal deficit present.     Mental Status: She is alert and oriented to person, place, and time.     Comments: Motor strength is 5/5 bilateral deltoid bicep tricep grip normal sensation bilateral upper extremities Normal tone bilateral upper extremities  Psychiatric:        Mood and Affect: Mood normal.        Behavior: Behavior normal.   There is increased tone bilateral upper traps no torsional deformities.       Assessment & Plan:  1.  History of cervical dystonia improved after botulinum toxin injection however with postinjection weakness in the cervical paraspinal musculature.  Would therefore reduce dosing to 100 units total Left trap 25U Left splenius cap 50U Left semispinalis capitus 25U  She does have muscle tightness on the right side as well may reassess after the injection whether these need to be added however her prior treatment protocol only involved left side.

## 2021-03-11 NOTE — Patient Instructions (Signed)
Will repeat Botox to left side neck, may need to to right side in future depending upon response

## 2021-03-13 ENCOUNTER — Other Ambulatory Visit: Payer: Self-pay | Admitting: Family Medicine

## 2021-03-18 ENCOUNTER — Other Ambulatory Visit: Payer: Self-pay | Admitting: Family Medicine

## 2021-03-31 ENCOUNTER — Other Ambulatory Visit: Payer: Self-pay | Admitting: Family Medicine

## 2021-03-31 DIAGNOSIS — E2839 Other primary ovarian failure: Secondary | ICD-10-CM

## 2021-04-02 ENCOUNTER — Ambulatory Visit
Admission: RE | Admit: 2021-04-02 | Discharge: 2021-04-02 | Disposition: A | Discharge status: Home / Self Care | Payer: Medicare Other | Source: Ambulatory Visit | Attending: Family Medicine | Admitting: Family Medicine

## 2021-04-02 ENCOUNTER — Other Ambulatory Visit: Payer: Self-pay

## 2021-04-02 DIAGNOSIS — Z1231 Encounter for screening mammogram for malignant neoplasm of breast: Secondary | ICD-10-CM

## 2021-04-02 DIAGNOSIS — E2839 Other primary ovarian failure: Secondary | ICD-10-CM

## 2021-04-02 DIAGNOSIS — M85852 Other specified disorders of bone density and structure, left thigh: Secondary | ICD-10-CM | POA: Diagnosis not present

## 2021-04-02 DIAGNOSIS — M85832 Other specified disorders of bone density and structure, left forearm: Secondary | ICD-10-CM | POA: Diagnosis not present

## 2021-04-03 ENCOUNTER — Other Ambulatory Visit: Payer: Self-pay | Admitting: Family Medicine

## 2021-04-03 ENCOUNTER — Telehealth: Payer: Self-pay | Admitting: Pharmacist

## 2021-04-03 DIAGNOSIS — R928 Other abnormal and inconclusive findings on diagnostic imaging of breast: Secondary | ICD-10-CM

## 2021-04-03 NOTE — Progress Notes (Signed)
Chronic Care Management Pharmacy Note  04/10/2021 Name:  Janet Peterson MRN:  [id number] DOB:  1950/11/07  Summary: Initial pharmD visit.  Patient doing well overall, needs updated lipid panel (last 2018).  She has not checked BP at home but no symptoms of hypertension.  All meds current and updated.  Recommendations/Changes made from today's visit: Recheck lipids - encouraged PCP physical  Plan: FU 6 months   Subjective: Janet Peterson is an 70 y.o. year old female who is a primary patient of Pickard, Cammie Mcgee, MD.  The CCM team was consulted for assistance with disease management and care coordination needs.    Engaged with patient by telephone for initial visit in response to provider referral for pharmacy case management and/or care coordination services.   Consent to Services:  The patient was given the following information about Chronic Care Management services today, agreed to services, and gave verbal consent: 1. CCM service includes personalized support from designated clinical staff supervised by the primary care provider, including individualized plan of care and coordination with other care providers 2. 24/7 contact phone numbers for assistance for urgent and routine care needs. 3. Service will only be billed when office clinical staff spend 20 minutes or more in a month to coordinate care. 4. Only one practitioner may furnish and bill the service in a calendar month. 5.The patient may stop CCM services at any time (effective at the end of the month) by phone call to the office staff. 6. The patient will be responsible for cost sharing (co-pay) of up to 20% of the service fee (after annual deductible is met). Patient agreed to services and consent obtained.  Patient Care Team: Susy Frizzle, MD as PCP - General (Family Medicine) Bo Merino, MD as Consulting Physician (Rheumatology) Edythe Clarity, [facility] as Pharmacist (Pharmacist)  Recent office visits:  01/31/21  Mirna Mires, MD - [place] - cyclobenzaprine (FLEXERIL) 10 MG tablet, hydrochlorothiazide (HYDRODIURIL) 25 MG tablet, ipratropium (ATROVENT) 0.06 % nasal spray, triamcinolone cream (KENALOG) 0.1 % were prescribed at this visit. Follow up as needed or no improvement.     Recent consult visits:  03/11/21 Alysia Penna, MD - Cervical Dystonia- Physical Medicine - No medication changes. Will follow up in 4 weeks for Botox to left side of neck.   02/27/21 Hazel Sams, PA-C - Psoriatic arthritis - No medication changes. Return in 5 months.    02/11/21 Alysia Penna, MD - Physical Medicine - Primary Osteoarthritis - Right first carpometacarpal joint injection under ultrasound guidance without complication. Follow up in 3 weeks.    12/17/20 Darcus Pester, MD - Harriet Pho disease - Right first extensor compartment injection Indication stenosing tenosynovitis right APL right EPB De Quervain's syndrome right side without complication. After care instructions given. Follow up in 4 weeks for Right first carpometacarpal injection under ultrasound guidance.   11/27/20 Janeth Rase, NP - Major Depressive Disorder - No notes available.    11/26/20 [place] - Pre procedure - No notes available.    11/05/20 Darcus Pester, MD - OBGYN - Myofascial pai syndrome of thoracic spine - Referral placed to physical therapy. Trigger Point Injection Bilateral middle trap, Rhomboid major, infraspinatus completed in office without complication. After care instructions were given. Follow up as indicated.    10/23/20 [place] - Abnormal findings on diagnostic imaging - No notes available.    10/17/20 Ronnette Juniper - Abnormal findings on diagnostic imaging of other abdominal regions - No notes  available.    10/17/20 Lavonia Dana - Diagnostic Radiology - Endometrial hyperplasia - No noted available.      Hospital visits: 12/09/20   Medication Reconciliation was completed by comparing discharge  summary, patient's EMR and Pharmacy list, and upon discussion with patient.   Admitted to the hospital on 12/09/20 due to Thickened Endometrium. Discharge date was 12/09/20. Discharged from [place]?Medications Started at [facility] Discharge:?? oxyCODONE-acetaminophen (PERCOCET) 5-325 MG tablet 1 tablet at bedtime.    Medication Changes at Hospital Discharge: No changes noted.    Medications Discontinued at Hospital Discharge: No medications were discontinued at discharge.    Medications that remain the same after Hospital Discharge:??  All other medications will remain the same.     Objective:  Lab Results  Component Value Date   CREATININE 0.73 01/30/2021   BUN 11 01/30/2021   GFRNONAA 70 09/26/2020   GFRAA 82 09/26/2020   NA 140 01/30/2021   K 4.2 01/30/2021   CALCIUM 9.9 01/30/2021   CO2 30 01/30/2021   GLUCOSE 64 (L) 01/30/2021    Lab Results  Component Value Date/Time   HGBA1C 6.1 (H) 02/19/2020 03:23 PM   HGBA1C 5.6 11/19/2015 12:12 PM    Last diabetic Eye exam: No results found for: HMDIABEYEEXA  Last diabetic Foot exam: No results found for: HMDIABFOOTEX   Lab Results  Component Value Date   CHOL 182 10/15/2016   HDL 54 10/15/2016   LDLCALC 104 (H) 10/15/2016   TRIG 121 10/15/2016   CHOLHDL 3.4 10/15/2016    Hepatic Function Latest Ref Rng & Units 01/30/2021 09/26/2020 06/27/2020  Total Protein 6.1 - 8.1 g/dL 6.5 6.2 6.3  Albumin 3.5 - 5.0 g/dL - - -  AST 10 - 35 U/L 14 23 11   ALT 6 - 29 U/L 6 10 7   Alk Phosphatase 38 - 126 U/L - - -  Total Bilirubin 0.2 - 1.2 mg/dL 0.3 0.4 0.5    Lab Results  Component Value Date/Time   TSH 1.55 06/05/2019 04:27 PM   TSH 0.550 02/11/2015 12:53 PM    CBC Latest Ref Rng & Units 01/30/2021 12/04/2020 09/26/2020  WBC 3.8 - 10.8 Thousand/uL 6.5 6.5 5.9  Hemoglobin 11.7 - 15.5 g/dL 11.7 11.3(L) 11.6(L)  Hematocrit 35.0 - 45.0 % 36.5 34.6(L) 34.9(L)  Platelets 140 - 400 Thousand/uL 226 255 244    Lab  Results  Component Value Date/Time   VD25OH 32 02/24/2018 04:15 PM   VD25OH 45 04/20/2017 04:14 PM    Clinical ASCVD: No  The 10-year ASCVD risk score (Arnett DK, et al., 2019) is: 15.9%   Values used to calculate the score:     Age: 67 years     Sex: Female     Is Non-Hispanic African American: No     Diabetic: No     Tobacco smoker: No     Systolic Blood Pressure: 536 mmHg     Is BP treated: Yes     HDL Cholesterol: 63 mg/dL     Total Cholesterol: 183 mg/dL    Depression screen [facility] 2/9 03/11/2021 02/11/2021 11/05/2020  Decreased Interest 0 1 1  Down, Depressed, Hopeless 0 1 1  PHQ - 2 Score 0 2 2  Altered sleeping - - 1  Tired, decreased energy - - 0  Change in appetite - - 0  Feeling bad or failure about yourself  - - 0  Trouble concentrating - - 0  Moving slowly or fidgety/restless - -  0  Suicidal thoughts - - 0  PHQ-9 Score - - 3  Difficult doing work/chores - - Not difficult at all  Some recent data might be hidden    Social History   Tobacco Use  Smoking Status Former   Packs/day: 0.50   Years: 10.00   Pack years: 5.00   Types: Cigarettes   Quit date: 03/07/1995   Years since quitting: 26.1  Smokeless Tobacco Never   BP Readings from Last 3 Encounters:  03/11/21 140/80  02/27/21 140/90  02/11/21 128/83   Pulse Readings from Last 3 Encounters:  03/11/21 77  02/27/21 80  02/11/21 89   Wt Readings from Last 3 Encounters:  03/11/21 202 lb 6.4 oz (91.8 kg)  02/27/21 202 lb 3.2 oz (91.7 kg)  02/11/21 200 lb (90.7 kg)   BMI Readings from Last 3 Encounters:  03/11/21 34.74 kg/m  02/27/21 34.71 kg/m  02/11/21 34.33 kg/m    Assessment/Interventions: Review of patient past medical history, allergies, medications, health status, including review of consultants reports, laboratory and other test data, was performed as part of comprehensive evaluation and provision of chronic care management services.   SDOH:  (Social Determinants of Health) assessments  and interventions performed: Yes  Financial Resource Strain: Low Risk    Difficulty of Paying Living Expenses: Not hard at all    SDOH Screenings   Alcohol Screen: Low Risk    Last Alcohol Screening Score (AUDIT): 1  Depression (PHQ2-9): Low Risk    PHQ-2 Score: 0  Financial Resource Strain: Low Risk    Difficulty of Paying Living Expenses: Not hard at all  Food Insecurity: No Food Insecurity   Worried About Charity fundraiser in the Last Year: Never true   Ran Out of Food in the Last Year: Never true  Housing: Low Risk    Last Housing Risk Score: 0  Physical Activity: Inactive   Days of Exercise per Week: 0 days   Minutes of Exercise per Session: 0 min  Social Connections: Moderately Isolated   Frequency of Communication with Friends and Family: More than three times a week   Frequency of Social Gatherings with Friends and Family: Once a week   Attends Religious Services: Never   Marine scientist or Organizations: No   Attends Music therapist: Never   Marital Status: Married  Stress: No Stress Concern Present   Feeling of Stress : Not at all  Tobacco Use: Medium Risk   Smoking Tobacco Use: Former   Smokeless Tobacco Use: Never   Passive Exposure: Not on Pensions consultant Needs: No Transportation Needs   Lack of Transportation (Medical): No   Lack of Transportation (Non-Medical): No    CCM Care Plan  Allergies  Allergen Reactions   Latex Rash    And Mouth sores Other reaction(s): rash Other reaction(s): rash   Amlodipine Besy-Benazepril Hcl Other (See Comments)    COUGH   Amoxicillin-Pot Clavulanate Diarrhea and Nausea And Vomiting    Other reaction(s): sick on stomach   Bextra [Valdecoxib] Diarrhea and Nausea And Vomiting    REFLUX   Bupropion     Other reaction(s): rash, hives, nausea, high BP , sores, Other reaction(s): rash, hives, nausea, high BP , sores,   Chlorhexidine     rash Other reaction(s): rash all over body   Lipitor  [Atorvastatin Calcium] Other (See Comments)    MYALGIAS   Sulfa Antibiotics Nausea And Vomiting    CRAMPING Other reaction(s): stomach hurts  Zocor [Simvastatin] Other (See Comments)    MYALGIA    Medications Reviewed Today     Reviewed by Edythe Clarity, [facility] (Pharmacist) on 04/10/21 at 1449  Med List Status: <None>   Medication Order Taking? Sig Documenting Provider Last Dose Status Informant  buPROPion (WELLBUTRIN XL) 300 MG 24 hr tablet [id number] Yes Take 300 mg by mouth every morning. [provider] Taking Active            Med Note Jacobo Forest, Leilani Able   Wed Jan 15, 2021 10:23 AM) For depression  clonazePAM (KLONOPIN) 1 MG tablet [id number] Yes TAKE 1 TABLET BY MOUTH EVERY DAY AS NEEDED FOR ANXIETY  Patient taking differently: Take 1 mg by mouth daily as needed for anxiety.   Susy Frizzle, MD Taking Active            Med Note Caro Hight   Tue Nov 05, 2020  2:20 PM) Fill date 10/27/20 #30 today # 21 last taken last pm  cyclobenzaprine (FLEXERIL) 10 MG tablet [id number] No TAKE 1 TABLET BY MOUTH THREE TIMES A DAY AS NEEDED FOR MUSCLE SPASMS  Patient not taking: Reported on 04/10/2021   Susy Frizzle, MD Not Taking Active   doxepin (SINEQUAN) 50 MG capsule [id number] Yes Take 50 mg by mouth. [provider] Taking Active            Med Note Jacobo Forest, Leilani Able   Wed Jan 15, 2021 10:23 AM) Nightly for sleep  ezetimibe (ZETIA) 10 MG tablet [id number] Yes TAKE 1 TABLET BY MOUTH EVERY DAY Susy Frizzle, MD Taking Active   FLUoxetine (PROZAC) 40 MG capsule [id number] Yes Take 40 mg by mouth daily. [provider] Taking Active   fluticasone (FLONASE) 50 MCG/ACT nasal spray [id number] Yes Place 2 sprays into both nostrils daily as needed for allergies. Susy Frizzle, MD Taking Active   hydrochlorothiazide (HYDRODIURIL) 25 MG tablet [id number] Yes Take 1 tablet (25 mg total) by mouth daily. Susy Frizzle, MD Taking Active   INGREZZA 80 MG  capsule [id number] Yes Take one capsule by mouth daily Penumalli, Earlean Polka, MD Taking Active   ipratropium (ATROVENT) 0.06 % nasal spray [id number] Yes Place 2 sprays into both nostrils 4 (four) times daily. Susy Frizzle, MD Taking Active   leflunomide (ARAVA) 20 MG tablet [id number] Yes TAKE 1 TABLET BY MOUTH EVERY DAY Ofilia Neas, PA-C Taking Active   metoprolol succinate (TOPROL-XL) 50 MG 24 hr tablet [id number] Yes TAKE 1 TABLET BY MOUTH ONCE DAILY FOLLOWING A MEAL Susy Frizzle, MD Taking Active   omeprazole (PRILOSEC) 40 MG capsule [id number] Yes TAKE 1 CAPSULE BY MOUTH EVERY MORNING AND AT BEDTIME Susy Frizzle, MD Taking Active   ondansetron (ZOFRAN) 4 MG tablet [id number] No Take 4 mg by mouth 4 (four) times daily as needed for nausea.  Patient not taking: Reported on 04/10/2021   [provider] Not Taking Active   oxyCODONE-acetaminophen (PERCOCET) 5-325 MG tablet [id number] Yes Take 1 tablet by mouth at bedtime.  Patient taking differently: Take 1 tablet by mouth at bedtime. Prescribed by Dr. Payton Doughty, MD Taking Active   triamcinolone cream (KENALOG) 0.1 % [id number] Yes Apply 1 application topically 2 (two) times daily. Susy Frizzle, MD Taking Active   valsartan (DIOVAN) 320 MG tablet [id number] Yes TAKE 1 TABLET BY MOUTH EVERY DAY Susy Frizzle, MD Taking Active  Patient Active Problem List   Diagnosis Date Noted   C. difficile colitis 09/29/2019   Colitis 09/28/2019   Anxiety and depression 09/28/2019   DDD (degenerative disc disease), cervical s/p fusion 11/12/2016   H/O total knee replacement, left 05/06/2016   DDD lumbar spine status post fusion 05/06/2016   Psoriasis 05/05/2016   High risk medication use 05/05/2016   Cervical post-laminectomy syndrome 12/09/2015   Thoracic postlaminectomy syndrome 12/09/2015   Psoriatic arthritis ([place]) 08/23/2012   Osteopenia 08/23/2012   HLD (hyperlipidemia) 08/23/2012    RLS (restless legs syndrome) 08/23/2012   Insomnia 08/23/2012   Fibromyalgia syndrome 08/12/2012   Low back pain 08/12/2012   Syncope    PVC (premature ventricular contraction)    OCD (obsessive compulsive disorder)    GERD (gastroesophageal reflux disease)    Hypertension     Immunization History  Administered Date(s) Administered   Fluad Quad(high Dose 65+) 01/26/2019, 03/05/2020   Influenza Whole 03/04/2012   Influenza, High Dose Seasonal PF 03/25/2018   Influenza,inj,Quad PF,6+ Mos 02/28/2013, 03/07/2014, 02/28/2015, 03/27/2016, 03/09/2017   Pneumococcal Conjugate-13 09/10/2016   Pneumococcal Polysaccharide-23 03/12/2006, 11/09/2011   Unspecified SARS-COV-2 Vaccination 08/07/2019, 08/28/2019, 03/08/2020    Conditions to be addressed/monitored:  HTN, PVC, GERD, Osteopenia, Fibromyalgia, HLD, RLS, Depression/Anxiety  Care Plan : General Pharmacy (Adult)  Updates made by Edythe Clarity, RPH since 04/10/2021 12:00 AM     Problem: HTN, PVC, GERD, Osteopenia, Fibromyalgia, HLD, RLS, Depression/Anxiety   Priority: High  Onset Date: 04/10/2021     Long-Range Goal: Patient-Specific Goal   Start Date: 04/10/2021  Expected End Date: 10/08/2021  This Visit's Progress: On track  Priority: High  Note:   Current Barriers:  Elevated LDL - no recent labs Fibromyalgia pain  Pharmacist Clinical Goal(s):  Patient will achieve improvement in LDL/pain as evidenced by labs and symotoms through collaboration with PharmD and provider.   Interventions: 1:1 collaboration with Susy Frizzle, MD regarding development and update of comprehensive plan of care as evidenced by provider attestation and co-signature Inter-disciplinary care team collaboration (see longitudinal plan of care) Comprehensive medication review performed; medication list updated in electronic medical record  Hypertension/PVC (BP goal <140/90) -Controlled -Current treatment: HCTZ 47m daily Toprol XL 560m daily Valsartan 32032maily -Medications previously tried: none noted  -Current home readings: not checking at home -Current dietary habits: eats two meals per day usually -Current exercise habits: minimal "walk from car to restaurant" -Denies hypotensive/hypertensive symptoms -Educated on BP goals and benefits of medications for prevention of heart attack, stroke and kidney damage; Importance of home blood pressure monitoring; Symptoms of hypotension and importance of maintaining adequate hydration; -Counseled to monitor BP at home periodically, document, and provide log at future appointments -Recommended to continue current medication  Hyperlipidemia: (LDL goal < 100) -Not ideally controlled -Current treatment: Zetia 3m69mily -Medications previously tried: atorvastatin (myalgias)  -Educated on Cholesterol goals;  Benefits of statin for ASCVD risk reduction; Most recent lipids from 2018.  Patient has intolerance to statins. Recommend recheck lipid panel to see if zetia is controlling LDL. -Recommended updated lipid panel first of the year.  Patient needs physical with PCP.  Could consider Repatha/Praluent if LDL is elevated.  Osteopenia (Goal Maintain bone density/prevent fractures) -Controlled -Last DEXA Scan: 04/02/21   T-Score femoral neck: -1.3 -Patient is not a candidate for pharmacologic treatment -Current treatment  None at this time -Medications previously tried: none noted  -Recommend weight-bearing and muscle strengthening exercises for building and maintaining bone density. -Recommended  continue current management, repeat DEXA in two years.  Fibromyalgia (Goal: Manage pain) -Not ideally controlled -Current treatment  Oxycodone/APAP 5-350m hs prn -Medications previously tried: gabapentin per patient - no effect -Pain on daily basis, she tries to take med once daily, however she does occasionally have to take one during the day.  Counseled on managing  pain -Recommended to continue current medication  Patient Goals/Self-Care Activities Patient will:  - take medications as prescribed as evidenced by patient report and record review focus on medication adherence by pill box check blood pressure periodically, document, and provide at future appointments  Follow Up Plan: The care management team will reach out to the patient again over the next 180 days.         Medication Assistance: None required.  Patient affirms current coverage meets needs.  Compliance/Adherence/Medication fill history: Care Gaps: Pneumonia vaccine  Star-Rating Drugs: 03/11/21 Valsartan 3261m90ds  [facility]  [id number] [place] [address] [id number]: [phone]: [phone] [place] [address], [id number]: [phone]: [phone] pill box? Yes Pt endorses 100% compliance  We discussed: Benefits of medication synchronization, packaging and delivery as well as enhanced pharmacist oversight with [facility]. Patient decided to: Utilize [facility] pharmacy for medication synchronization, packaging and delivery  Care Plan and Follow Up Patient Decision:  Patient agrees to Care Plan and Follow-up.  Plan: The care management team will reach out to the patient again over the next 180 days.  ChrBeverly MilchharmD, CPP Clinical Pharmacist Practitioner [phone]

## 2021-04-03 NOTE — Progress Notes (Signed)
Chronic Care Management Pharmacy Assistant   Name: Janet Peterson  MRN: [id number] DOB: 22-Jan-1951  Janet Peterson is an 70 y.o. year old female who presents for her initial CCM visit with the clinical pharmacist.  Reason for Encounter: Chart Prep for Initial CPP Visit    Recent office visits:  01/31/21 Mirna Mires, MD - [place] - cyclobenzaprine (FLEXERIL) 10 MG tablet, hydrochlorothiazide (HYDRODIURIL) 25 MG tablet, ipratropium (ATROVENT) 0.06 % nasal spray, triamcinolone cream (KENALOG) 0.1 % were prescribed at this visit. Follow up as needed or no improvement.   Recent consult visits:  03/11/21 Alysia Penna, MD - Cervical Dystonia- Physical Medicine - No medication changes. Will follow up in 4 weeks for Botox to left side of neck.  02/27/21 Hazel Sams, PA-C - Psoriatic arthritis - No medication changes. Return in 5 months.   02/11/21 Alysia Penna, MD - Physical Medicine - Primary Osteoarthritis - Right first carpometacarpal joint injection under ultrasound guidance without complication. Follow up in 3 weeks.   12/17/20 Darcus Pester, MD - Harriet Pho disease - Right first extensor compartment injection Indication stenosing tenosynovitis right APL right EPB De Quervain's syndrome right side without complication. After care instructions given. Follow up in 4 weeks for Right first carpometacarpal injection under ultrasound guidance.  11/27/20 Janeth Rase, NP - Major Depressive Disorder - No notes available.   11/26/20 [place] - Pre procedure - No notes available.   11/05/20 Darcus Pester, MD - OBGYN - Myofascial pai syndrome of thoracic spine - Referral placed to physical therapy. Trigger Point Injection Bilateral middle trap, Rhomboid major, infraspinatus completed in office without complication. After care instructions were given. Follow up as indicated.   10/23/20 [place] - Abnormal findings on diagnostic imaging - No notes available.    10/17/20 Ronnette Juniper - Abnormal findings on diagnostic imaging of other abdominal regions - No notes available.   10/17/20 Lavonia Dana - Diagnostic Radiology - Endometrial hyperplasia - No noted available.    Hospital visits: 12/09/20  Medication Reconciliation was completed by comparing discharge summary, patient's EMR and Pharmacy list, and upon discussion with patient.  Admitted to the hospital on 12/09/20 due to Thickened Endometrium. Discharge date was 12/09/20. Discharged from [facility]?Medications Started at [facility] Discharge:?? oxyCODONE-acetaminophen (PERCOCET) 5-325 MG tablet 1 tablet at bedtime.   Medication Changes at Hospital Discharge: No changes noted.   Medications Discontinued at Hospital Discharge: No medications were discontinued at discharge.   Medications that remain the same after Hospital Discharge:??  All other medications will remain the same.    Medications: Outpatient Encounter Medications as of 04/03/2021  Medication Sig Note   buPROPion (WELLBUTRIN XL) 300 MG 24 hr tablet Take 300 mg by mouth every morning. 01/15/2021: For depression   clonazePAM (KLONOPIN) 1 MG tablet TAKE 1 TABLET BY MOUTH EVERY DAY AS NEEDED FOR ANXIETY (Patient taking differently: Take 1 mg by mouth daily as needed for anxiety.) 11/05/2020: Fill date 10/27/20 #30 today # 21 last taken last pm   cyclobenzaprine (FLEXERIL) 10 MG tablet TAKE 1 TABLET BY MOUTH THREE TIMES A DAY AS NEEDED FOR MUSCLE SPASMS    doxepin (SINEQUAN) 50 MG capsule Take 50 mg by mouth. 01/15/2021: Nightly for sleep   ezetimibe (ZETIA) 10 MG tablet TAKE 1 TABLET BY MOUTH EVERY DAY    FLUoxetine (PROZAC) 40 MG capsule Take 40 mg by mouth daily.    fluticasone (FLONASE) 50 MCG/ACT nasal spray Place 2  sprays into both nostrils daily as needed for allergies.    hydrochlorothiazide (HYDRODIURIL) 25 MG tablet Take 1 tablet (25 mg total) by mouth daily.    INGREZZA 80 MG capsule Take one capsule by mouth  daily    ipratropium (ATROVENT) 0.06 % nasal spray Place 2 sprays into both nostrils 4 (four) times daily.    leflunomide (ARAVA) 20 MG tablet TAKE 1 TABLET BY MOUTH EVERY DAY    metoprolol succinate (TOPROL-XL) 50 MG 24 hr tablet TAKE 1 TABLET BY MOUTH ONCE DAILY FOLLOWING A MEAL    omeprazole (PRILOSEC) 40 MG capsule TAKE 1 CAPSULE BY MOUTH EVERY MORNING AND AT BEDTIME    ondansetron (ZOFRAN) 4 MG tablet Take 4 mg by mouth 4 (four) times daily as needed for nausea.    oxyCODONE-acetaminophen (PERCOCET) 5-325 MG tablet Take 1 tablet by mouth at bedtime. (Patient taking differently: Take 1 tablet by mouth at bedtime. Prescribed by Dr. [name])    triamcinolone cream (KENALOG) 0.1 % Apply 1 application topically 2 (two) times daily.    valsartan (DIOVAN) 320 MG tablet TAKE 1 TABLET BY MOUTH EVERY DAY    No facility-administered encounter medications on file as of 04/03/2021.    Have you seen any other providers since your last visit? No  Any changes in your medications or health? No  Any side effects from any medications? no  Do you have an symptoms or problems not managed by your medications? no  Any concerns about your health right now? no  Has your provider asked that you check blood pressure, blood sugar, or follow special diet at home? no  Do you get any type of exercise on a regular basis? no  Can you think of a goal you would like to reach for your health? Patient would like to work on weight loss.   Do you have any problems getting your medications? no  Is there anything that you would like to discuss during the appointment? Patient's spouse reported he could not think of anything patient mentioned.   Please bring medications and supplements to appointment. Patients spouse helped answering questions for patient and confirmed apportionment date and time.    Care Gaps  AWV: overdue  Colonoscopy: due 09/28/29 DM Eye Exam: N/A DM Foot Exam: N/A Microalbumin:  N/A HbgAIC:  10/30/19 (6.0) DEXA: 04/02/21 Mammogram: 04/03/23   Star Rating Drugs: valsartan (DIOVAN) 320 MG tablet 03/11/21 90 days  Jobe Gibbon, [place] Clinical Pharmacist Assistant  [phone]  Time Spent: 43 minutes

## 2021-04-10 ENCOUNTER — Ambulatory Visit (INDEPENDENT_AMBULATORY_CARE_PROVIDER_SITE_OTHER): Payer: Medicare Other | Admitting: Pharmacist

## 2021-04-10 DIAGNOSIS — E7800 Pure hypercholesterolemia, unspecified: Secondary | ICD-10-CM

## 2021-04-10 DIAGNOSIS — I10 Essential (primary) hypertension: Secondary | ICD-10-CM

## 2021-04-10 DIAGNOSIS — M8589 Other specified disorders of bone density and structure, multiple sites: Secondary | ICD-10-CM

## 2021-04-10 NOTE — Patient Instructions (Addendum)
Visit Information   Goals Addressed             This Visit's Progress    Track and Manage My Blood Pressure-Hypertension       Timeframe:  Long-Range Goal Priority:  High Start Date: 04/10/21                            Expected End Date:  10/08/21                     Follow Up Date 07/11/21    - check blood pressure weekly - choose a place to take my blood pressure (home, clinic or office, retail store) - write blood pressure results in a log or diary    Why is this important?   You won't feel high blood pressure, but it can still hurt your blood vessels.  High blood pressure can cause heart or kidney problems. It can also cause a stroke.  Making lifestyle changes like losing a little weight or eating less salt will help.  Checking your blood pressure at home and at different times of the day can help to control blood pressure.  If the doctor prescribes medicine remember to take it the way the doctor ordered.  Call the office if you cannot afford the medicine or if there are questions about it.     Notes:        Patient Care Plan: General Pharmacy (Adult)     Problem Identified: HTN, PVC, GERD, Osteopenia, Fibromyalgia, HLD, RLS, Depression/Anxiety   Priority: High  Onset Date: 04/10/2021     Long-Range Goal: Patient-Specific Goal   Start Date: 04/10/2021  Expected End Date: 10/08/2021  This Visit's Progress: On track  Priority: High  Note:   Current Barriers:  Elevated LDL - no recent labs Fibromyalgia pain  Pharmacist Clinical Goal(s):  Patient will achieve improvement in LDL/pain as evidenced by labs and symotoms through collaboration with PharmD and provider.   Interventions: 1:1 collaboration with Susy Frizzle, MD regarding development and update of comprehensive plan of care as evidenced by provider attestation and co-signature Inter-disciplinary care team collaboration (see longitudinal plan of care) Comprehensive medication review performed;  medication list updated in electronic medical record  Hypertension/PVC (BP goal <140/90) -Controlled -Current treatment: HCTZ 25mg  daily Toprol XL 50mg  daily Valsartan 320mg  daily -Medications previously tried: none noted  -Current home readings: not checking at home -Current dietary habits: eats two meals per day usually -Current exercise habits: minimal "walk from car to restaurant" -Denies hypotensive/hypertensive symptoms -Educated on BP goals and benefits of medications for prevention of heart attack, stroke and kidney damage; Importance of home blood pressure monitoring; Symptoms of hypotension and importance of maintaining adequate hydration; -Counseled to monitor BP at home periodically, document, and provide log at future appointments -Recommended to continue current medication  Hyperlipidemia: (LDL goal < 100) -Not ideally controlled -Current treatment: Zetia 10mg  daily -Medications previously tried: atorvastatin (myalgias)  -Educated on Cholesterol goals;  Benefits of statin for ASCVD risk reduction; Most recent lipids from 2018.  Patient has intolerance to statins. Recommend recheck lipid panel to see if zetia is controlling LDL. -Recommended updated lipid panel first of the year.  Patient needs physical with PCP.  Could consider Repatha/Praluent if LDL is elevated.  Osteopenia (Goal Maintain bone density/prevent fractures) -Controlled -Last DEXA Scan: 04/02/21   T-Score femoral neck: -1.3 -Patient is not a candidate for pharmacologic treatment -Current treatment  None at this time -Medications previously tried: none noted  -Recommend weight-bearing and muscle strengthening exercises for building and maintaining bone density. -Recommended continue current management, repeat DEXA in two years.  Fibromyalgia (Goal: Manage pain) -Not ideally controlled -Current treatment  Oxycodone/APAP 5-325mg  hs prn -Medications previously tried: gabapentin per patient - no  effect -Pain on daily basis, she tries to take med once daily, however she does occasionally have to take one during the day.  Counseled on managing pain -Recommended to continue current medication  Patient Goals/Self-Care Activities Patient will:  - take medications as prescribed as evidenced by patient report and record review focus on medication adherence by pill box check blood pressure periodically, document, and provide at future appointments  Follow Up Plan: The care management team will reach out to the patient again over the next 180 days.        Ms. Defino was given information about Chronic Care Management services today including:  CCM service includes personalized support from designated clinical staff supervised by her physician, including individualized plan of care and coordination with other care providers 24/7 contact phone numbers for assistance for urgent and routine care needs. Standard insurance, coinsurance, copays and deductibles apply for chronic care management only during months in which we provide at least 20 minutes of these services. Most insurances cover these services at 100%, however patients may be responsible for any copay, coinsurance and/or deductible if applicable. This service may help you avoid the need for more expensive face-to-face services. Only one practitioner may furnish and bill the service in a calendar month. The patient may stop CCM services at any time (effective at the end of the month) by phone call to the office staff.  Patient agreed to services and verbal consent obtained.   The patient verbalized understanding of instructions, educational materials, and care plan provided today and agreed to receive a mailed copy of patient instructions, educational materials, and care plan.  Telephone follow up appointment with pharmacy team member scheduled for: 6 months  Janet Peterson, [place]

## 2021-04-11 ENCOUNTER — Encounter: Payer: Self-pay | Admitting: Nurse Practitioner

## 2021-04-11 ENCOUNTER — Ambulatory Visit (INDEPENDENT_AMBULATORY_CARE_PROVIDER_SITE_OTHER): Payer: Medicare Other | Admitting: Nurse Practitioner

## 2021-04-11 ENCOUNTER — Other Ambulatory Visit: Payer: Self-pay

## 2021-04-11 VITALS — BP 140/82 | HR 63 | Temp 97.4°F | Ht 63.0 in | Wt 203.4 lb

## 2021-04-11 DIAGNOSIS — R21 Rash and other nonspecific skin eruption: Secondary | ICD-10-CM | POA: Diagnosis not present

## 2021-04-11 NOTE — Progress Notes (Signed)
Subjective:    Patient ID: Janet Peterson, female    DOB: 1951/01/30, 70 y.o.   MRN: [id number]  HPI: [name] is a 70 y.o. female presenting for rash on hand and bump on neck.  Chief Complaint  Patient presents with   Follow-up    Rash on left hand/ bump on R side of neck   RASH Duration:  days - Wednesday night started itching  Location: hand - in between left thumb and pointer finger  Itching: yes Burning: no Redness: yes Oozing:  yes - clear Scaling: no Blisters: yes Painful: no Fevers: no Change in detergents/soaps/personal care products: no Recent illness: no Recent travel:no History of same: no Context: bigger Alleviating factors: calamine lotion, Kenalog  Treatments attempted: calamine lotion, Kenalog cream , claritin Shortness of breath: no  Throat/tongue swelling: no Myalgias/arthralgias: no  LUMP Duration: day  Location: right side neck Onset: sudden Painful: yes; when she touches on it  Quality: aching Severity: mild Alleviating factors: nothing tried Aggravating factors: touching it Discomfort: yes Status:  stable Trauma: no Redness: no Bruising: no Recent infection: no Swollen lymph nodes: no Requesting removal: no History of the same: no  Allergies  Allergen Reactions   Latex Rash    And Mouth sores Other reaction(s): rash Other reaction(s): rash   Amlodipine Besy-Benazepril Hcl Other (See Comments)    COUGH   Amoxicillin-Pot Clavulanate Diarrhea and Nausea And Vomiting    Other reaction(s): sick on stomach   Bextra [Valdecoxib] Diarrhea and Nausea And Vomiting    REFLUX   Bupropion     Other reaction(s): rash, hives, nausea, high BP , sores, Other reaction(s): rash, hives, nausea, high BP , sores,   Chlorhexidine     rash Other reaction(s): rash all over body   Lipitor [Atorvastatin Calcium] Other (See Comments)    MYALGIAS   Sulfa Antibiotics Nausea And Vomiting    CRAMPING Other reaction(s): stomach hurts   Zocor  [Simvastatin] Other (See Comments)    MYALGIA    Outpatient Encounter Medications as of 04/11/2021  Medication Sig Note   buPROPion (WELLBUTRIN XL) 300 MG 24 hr tablet Take 300 mg by mouth every morning. 01/15/2021: For depression   clonazePAM (KLONOPIN) 1 MG tablet TAKE 1 TABLET BY MOUTH EVERY DAY AS NEEDED FOR ANXIETY (Patient taking differently: Take 1 mg by mouth daily as needed for anxiety.) 11/05/2020: Fill date 10/27/20 #30 today # 21 last taken last pm   cyclobenzaprine (FLEXERIL) 10 MG tablet TAKE 1 TABLET BY MOUTH THREE TIMES A DAY AS NEEDED FOR MUSCLE SPASMS    doxepin (SINEQUAN) 50 MG capsule Take 50 mg by mouth. 01/15/2021: Nightly for sleep   ezetimibe (ZETIA) 10 MG tablet TAKE 1 TABLET BY MOUTH EVERY DAY    FLUoxetine (PROZAC) 40 MG capsule Take 40 mg by mouth daily.    fluticasone (FLONASE) 50 MCG/ACT nasal spray Place 2 sprays into both nostrils daily as needed for allergies.    hydrochlorothiazide (HYDRODIURIL) 25 MG tablet Take 1 tablet (25 mg total) by mouth daily.    INGREZZA 80 MG capsule Take one capsule by mouth daily    ipratropium (ATROVENT) 0.06 % nasal spray Place 2 sprays into both nostrils 4 (four) times daily.    leflunomide (ARAVA) 20 MG tablet TAKE 1 TABLET BY MOUTH EVERY DAY    metoprolol succinate (TOPROL-XL) 50 MG 24 hr tablet TAKE 1 TABLET BY MOUTH ONCE DAILY FOLLOWING A MEAL    mometasone (ELOCON) 0.1 %  cream Apply topically daily. Apply sparingly to affected area daily as needed.    omeprazole (PRILOSEC) 40 MG capsule TAKE 1 CAPSULE BY MOUTH EVERY MORNING AND AT BEDTIME    ondansetron (ZOFRAN) 4 MG tablet Take 4 mg by mouth 4 (four) times daily as needed for nausea.    triamcinolone cream (KENALOG) 0.1 % Apply 1 application topically 2 (two) times daily.    valACYclovir (VALTREX) 1000 MG tablet Take 1 tablet (1,000 mg total) by mouth 3 (three) times daily for 10 days.    valsartan (DIOVAN) 320 MG tablet TAKE 1 TABLET BY MOUTH EVERY DAY    [DISCONTINUED]  oxyCODONE-acetaminophen (PERCOCET) 5-325 MG tablet Take 1 tablet by mouth at bedtime. (Patient taking differently: Take 1 tablet by mouth at bedtime. Prescribed by Dr. [name])    No facility-administered encounter medications on file as of 04/11/2021.    Patient Active Problem List   Diagnosis Date Noted   C. difficile colitis 09/29/2019   Colitis 09/28/2019   Anxiety and depression 09/28/2019   DDD (degenerative disc disease), cervical s/p fusion 11/12/2016   H/O total knee replacement, left 05/06/2016   DDD lumbar spine status post fusion 05/06/2016   Psoriasis 05/05/2016   High risk medication use 05/05/2016   Cervical post-laminectomy syndrome 12/09/2015   Thoracic postlaminectomy syndrome 12/09/2015   Psoriatic arthritis ([place]) 08/23/2012   Osteopenia 08/23/2012   HLD (hyperlipidemia) 08/23/2012   RLS (restless legs syndrome) 08/23/2012   Insomnia 08/23/2012   Fibromyalgia syndrome 08/12/2012   Low back pain 08/12/2012   Syncope    PVC (premature ventricular contraction)    OCD (obsessive compulsive disorder)    GERD (gastroesophageal reflux disease)    Hypertension     Past Medical History:  Diagnosis Date   Ankylosing spondylitis ([place])    Anxiety and depression    Depression    Fibromyalgia    GERD (gastroesophageal reflux disease)    Hiatal hernia    per patient, dx by GI    History of basal cell carcinoma excision    FACE, 1992  &  1996   History of hiatal hernia    History of thrush    Hyperlipidemia    Hypertension    Insomnia    Left breast mass    OCD (obsessive compulsive disorder)    Osteopenia    PONV (postoperative nausea and vomiting)    Psoriatic arthritis ([facility])    PVC (premature ventricular contraction)    Rheumatoid arthritis ([place])     Relevant past medical, surgical, family and social history reviewed and updated as indicated. Interim medical history since our last visit reviewed.  Review of Systems  Constitutional: Negative.   Negative for activity change, appetite change, diaphoresis, fatigue and fever.  Per HPI unless specifically indicated above     Objective:    BP 140/82   Pulse 63   Temp (!) 97.4 F (36.3 C)   Ht 5\' 3"  (1.6 m)   Wt 203 lb 6.4 oz (92.3 kg)   SpO2 96%   BMI 36.03 kg/m   Wt Readings from Last 3 Encounters:  04/11/21 203 lb 6.4 oz (92.3 kg)  03/11/21 202 lb 6.4 oz (91.8 kg)  02/27/21 202 lb 3.2 oz (91.7 kg)    Physical Exam Vitals and nursing note reviewed.  Constitutional:      General: She is not in acute distress.    Appearance: Normal appearance. She is not toxic-appearing.  HENT:     Mouth/Throat:  Mouth: Mucous membranes are moist.     Pharynx: Oropharynx is clear.  Cardiovascular:     Rate and Rhythm: Normal rate and regular rhythm.  Pulmonary:     Effort: Pulmonary effort is normal. No respiratory distress.     Breath sounds: Normal breath sounds.  Skin:    General: Skin is warm and dry.     Findings: Rash present. Rash is crusting, urticarial and vesicular.     Comments: Rash appreciated to left hand web in between thumb and first finger; slightly erythematous and oozing without warmth or odor Papule noted to right posterior neck as pictured below.  No warmth, drainage, fluctuance, oozing.  Neurological:     Mental Status: She is alert and oriented to person, place, and time.     Sensory: No sensory deficit.     Motor: No weakness.     Coordination: Coordination normal.  Psychiatric:        Mood and Affect: Mood normal.        Behavior: Behavior normal.        Thought Content: Thought content normal.        Judgment: Judgment normal.           Assessment & Plan:  1. Rash Rash to hand - suspect herpes zoster vs. Psoriatic arthritis flare.  Start Valtrex for possible herpes.  Can use topical steroid cream as needed to help relieve itch.  In regard to bump on neck, this resembles an insect bite of some sort.  If this becomes itchy, she can also use the  steroid cream on this area.  No s/s infection or indication for I&D today.  Follow up if symptoms worsen or persist.  - valACYclovir (VALTREX) 1000 MG tablet; Take 1 tablet (1,000 mg total) by mouth 3 (three) times daily for 10 days.  Dispense: 30 tablet; Refill: 0 - mometasone (ELOCON) 0.1 % cream; Apply topically daily. Apply sparingly to affected area daily as needed.  Dispense: 15 g; Refill: 0    Follow up plan: Return if symptoms worsen or fail to improve.

## 2021-04-14 MED ORDER — VALACYCLOVIR HCL 1 G PO TABS: 1000.0000 mg | ORAL_TABLET | Freq: Three times a day (TID) | ORAL | 0 refills | Status: AC

## 2021-04-14 MED ORDER — MOMETASONE FUROATE 0.1 % EX CREA: TOPICAL_CREAM | Freq: Every day | CUTANEOUS | 0 refills | Status: DC

## 2021-04-15 ENCOUNTER — Other Ambulatory Visit: Payer: Self-pay

## 2021-04-15 MED ORDER — OXYCODONE-ACETAMINOPHEN 5-325 MG PO TABS: 1.0000 | ORAL_TABLET | Freq: Every day | ORAL | 0 refills | Status: DC

## 2021-04-15 NOTE — Telephone Encounter (Signed)
LOV 04/11/21 Last refill 02/18/21, #60, o refills  Please review, thanks!

## 2021-04-15 NOTE — Telephone Encounter (Signed)
PDMP reviewed.  Patient appears to take this medication chronically.  Future dated refill sent in in place of PCP who is out of the office.

## 2021-04-23 DIAGNOSIS — I493 Ventricular premature depolarization: Secondary | ICD-10-CM

## 2021-04-23 DIAGNOSIS — M85859 Other specified disorders of bone density and structure, unspecified thigh: Secondary | ICD-10-CM | POA: Diagnosis not present

## 2021-04-23 DIAGNOSIS — I10 Essential (primary) hypertension: Secondary | ICD-10-CM | POA: Diagnosis not present

## 2021-04-23 DIAGNOSIS — E785 Hyperlipidemia, unspecified: Secondary | ICD-10-CM | POA: Diagnosis not present

## 2021-04-29 ENCOUNTER — Encounter: Payer: Self-pay | Admitting: Physical Medicine & Rehabilitation

## 2021-04-29 ENCOUNTER — Encounter: Payer: Medicare Other | Attending: Physical Medicine & Rehabilitation | Admitting: Physical Medicine & Rehabilitation

## 2021-04-29 ENCOUNTER — Other Ambulatory Visit: Payer: Self-pay

## 2021-04-29 VITALS — BP 139/91 | HR 79 | Temp 98.3°F | Ht 63.0 in | Wt 201.6 lb

## 2021-04-29 DIAGNOSIS — G243 Spasmodic torticollis: Secondary | ICD-10-CM | POA: Insufficient documentation

## 2021-04-29 DIAGNOSIS — L720 Epidermal cyst: Secondary | ICD-10-CM | POA: Diagnosis not present

## 2021-04-29 DIAGNOSIS — L820 Inflamed seborrheic keratosis: Secondary | ICD-10-CM | POA: Diagnosis not present

## 2021-04-29 DIAGNOSIS — D485 Neoplasm of uncertain behavior of skin: Secondary | ICD-10-CM | POA: Diagnosis not present

## 2021-04-29 DIAGNOSIS — Z85828 Personal history of other malignant neoplasm of skin: Secondary | ICD-10-CM | POA: Diagnosis not present

## 2021-04-29 NOTE — Progress Notes (Signed)
Botulinum toxin injection for cervical dystonia CPT code [phone] Diagnosis code G 24.3 Indication is cervical dystonia that has not responded to conservative care and interferes with activities of daily living as well as cervical range of motion. Chronic cervical pain not relieved by other treatments.  Informed consent was obtained after describing risks and benefits of the procedure with the patient this included bleeding bruising and infection The patient elects to proceed and has given Written consent.  REMS form completed  Patient placed in a seated position A 27-gauge 1 inch needle electrode was used to guide the injection under EMG guidance.  Muscles and dosing:  Left trap 25U Left splenius cap 50U Left semispinalis capitus 25U  All injections done after negative drawback for blood. Patient tolerated procedure well. Post procedure instructions given. Follow up appointment made

## 2021-04-29 NOTE — Patient Instructions (Signed)

## 2021-05-02 ENCOUNTER — Ambulatory Visit
Admission: RE | Admit: 2021-05-02 | Discharge: 2021-05-02 | Disposition: A | Discharge status: Home / Self Care | Payer: Medicare Other | Source: Ambulatory Visit | Attending: Family Medicine | Admitting: Family Medicine

## 2021-05-02 DIAGNOSIS — R922 Inconclusive mammogram: Secondary | ICD-10-CM | POA: Diagnosis not present

## 2021-05-02 DIAGNOSIS — R928 Other abnormal and inconclusive findings on diagnostic imaging of breast: Secondary | ICD-10-CM

## 2021-05-12 ENCOUNTER — Other Ambulatory Visit: Payer: Self-pay | Admitting: Physician Assistant

## 2021-05-12 DIAGNOSIS — Z79899 Other long term (current) drug therapy: Secondary | ICD-10-CM

## 2021-05-13 NOTE — Telephone Encounter (Signed)
Next Visit: 08/05/2020  Last Visit: 02/27/2021  Last Fill: 02/12/2021  DX: Psoriatic arthritis   Current Dose per office note 02/27/2021: Arava 20 mg 1 tablet by mouth daily  Labs: 01/30/2021 Glucose was 64. Rest of CMP WNL.  CBC WNL.   Patient's husband advised patient is due to update labs. He will advised patient.   Okay to refill Arava?

## 2021-05-21 ENCOUNTER — Encounter: Payer: Self-pay | Admitting: Nurse Practitioner

## 2021-05-21 ENCOUNTER — Telehealth (INDEPENDENT_AMBULATORY_CARE_PROVIDER_SITE_OTHER): Payer: Medicare Other | Admitting: Nurse Practitioner

## 2021-05-21 DIAGNOSIS — J0140 Acute pansinusitis, unspecified: Secondary | ICD-10-CM | POA: Diagnosis not present

## 2021-05-21 MED ORDER — DOXYCYCLINE HYCLATE 100 MG PO TABS: 100.0000 mg | ORAL_TABLET | Freq: Two times a day (BID) | ORAL | 0 refills | Status: DC

## 2021-05-21 NOTE — Progress Notes (Signed)
Subjective:    Patient ID: Janet Peterson, female    DOB: 05-28-1950, 70 y.o.   MRN: [id number]  HPI: Janet Peterson is a 70 y.o. female presenting virtually for sinuses blocked.  Chief Complaint  Patient presents with   Sinusitis   UPPER RESPIRATORY TRACT INFECTION Onset: 8 days ago Fever: no Cough: yes; productive Shortness of breath: no Wheezing: no Chest pain: no Chest tightness: no Chest congestion: yes Nasal congestion: yes Runny nose: no Post nasal drip: no Sneezing: no Sore throat: no Swollen glands: no Sinus pressure:  yes; cheeks and above eyes Headache: yes Face pain: no Toothache: no Ear pain: no  Ear pressure: yes  Eyes red/itching:no Eye drainage/crusting: no  Nausea: no  Vomiting: no Diarrhea: no  Change in appetite: no  Loss of taste/smell: no  Rash: no Fatigue: no Sick contacts: no Strep contacts: no  Context: stable Recurrent sinusitis: no Treatments attempted: Mucinex, Tylenol Relief with OTC medications: no  Allergies  Allergen Reactions   Latex Rash    And Mouth sores Other reaction(s): rash Other reaction(s): rash Other reaction(s): rash   Amlodipine Besy-Benazepril Hcl Other (See Comments)    COUGH   Amoxicillin-Pot Clavulanate Diarrhea and Nausea And Vomiting    Other reaction(s): sick on stomach Other reaction(s): sick on stomach   Bextra [Valdecoxib] Diarrhea and Nausea And Vomiting    REFLUX   Bupropion     Other reaction(s): rash, hives, nausea, high BP , sores, Other reaction(s): rash, hives, nausea, high BP , sores, Other reaction(s): rash, hives, nausea, high BP , sores,   Chlorhexidine     rash Other reaction(s): rash all over body Other reaction(s): rash all over body   Lipitor [Atorvastatin Calcium] Other (See Comments)    MYALGIAS   Sulfa Antibiotics Nausea And Vomiting    CRAMPING Other reaction(s): stomach hurts Other reaction(s): stomach hurts   Zocor [Simvastatin] Other (See Comments)    MYALGIA     Outpatient Encounter Medications as of 05/21/2021  Medication Sig Note   doxycycline (VIBRA-TABS) 100 MG tablet Take 1 tablet (100 mg total) by mouth 2 (two) times daily.    buPROPion (WELLBUTRIN XL) 300 MG 24 hr tablet Take 300 mg by mouth every morning. 01/15/2021: For depression   clonazePAM (KLONOPIN) 1 MG tablet TAKE 1 TABLET BY MOUTH EVERY DAY AS NEEDED FOR ANXIETY (Patient taking differently: Take 1 mg by mouth daily as needed for anxiety.) 11/05/2020: Fill date 10/27/20 #30 today # 21 last taken last pm   cyclobenzaprine (FLEXERIL) 10 MG tablet TAKE 1 TABLET BY MOUTH THREE TIMES A DAY AS NEEDED FOR MUSCLE SPASMS    doxepin (SINEQUAN) 50 MG capsule Take 50 mg by mouth. 01/15/2021: Nightly for sleep   ezetimibe (ZETIA) 10 MG tablet TAKE 1 TABLET BY MOUTH EVERY DAY    FLUoxetine (PROZAC) 40 MG capsule Take 40 mg by mouth daily.    fluticasone (FLONASE) 50 MCG/ACT nasal spray Place 2 sprays into both nostrils daily as needed for allergies.    hydrochlorothiazide (HYDRODIURIL) 25 MG tablet Take 1 tablet (25 mg total) by mouth daily.    INGREZZA 80 MG capsule Take one capsule by mouth daily    ipratropium (ATROVENT) 0.06 % nasal spray Place 2 sprays into both nostrils 4 (four) times daily.    leflunomide (ARAVA) 20 MG tablet TAKE 1 TABLET BY MOUTH EVERY DAY    metoprolol succinate (TOPROL-XL) 50 MG 24 hr tablet TAKE 1 TABLET BY MOUTH ONCE DAILY  FOLLOWING A MEAL    mometasone (ELOCON) 0.1 % cream Apply topically daily. Apply sparingly to affected area daily as needed.    omeprazole (PRILOSEC) 40 MG capsule TAKE 1 CAPSULE BY MOUTH EVERY MORNING AND AT BEDTIME    ondansetron (ZOFRAN) 4 MG tablet Take 4 mg by mouth 4 (four) times daily as needed for nausea.    oxyCODONE-acetaminophen (PERCOCET) 5-325 MG tablet Take 1 tablet by mouth at bedtime.    triamcinolone cream (KENALOG) 0.1 % Apply 1 application topically 2 (two) times daily.    valsartan (DIOVAN) 320 MG tablet TAKE 1 TABLET BY MOUTH EVERY  DAY    No facility-administered encounter medications on file as of 05/21/2021.    Patient Active Problem List   Diagnosis Date Noted   C. difficile colitis 09/29/2019   Colitis 09/28/2019   Anxiety and depression 09/28/2019   DDD (degenerative disc disease), cervical s/p fusion 11/12/2016   H/O total knee replacement, left 05/06/2016   DDD lumbar spine status post fusion 05/06/2016   Psoriasis 05/05/2016   High risk medication use 05/05/2016   Cervical post-laminectomy syndrome 12/09/2015   Thoracic postlaminectomy syndrome 12/09/2015   Psoriatic arthritis ([place]) 08/23/2012   Osteopenia 08/23/2012   HLD (hyperlipidemia) 08/23/2012   RLS (restless legs syndrome) 08/23/2012   Insomnia 08/23/2012   Fibromyalgia syndrome 08/12/2012   Low back pain 08/12/2012   Syncope    PVC (premature ventricular contraction)    OCD (obsessive compulsive disorder)    GERD (gastroesophageal reflux disease)    Hypertension     Past Medical History:  Diagnosis Date   Ankylosing spondylitis ([place])    Anxiety and depression    Depression    Fibromyalgia    GERD (gastroesophageal reflux disease)    Hiatal hernia    per patient, dx by GI    History of basal cell carcinoma excision    FACE, 1992  &  1996   History of hiatal hernia    History of thrush    Hyperlipidemia    Hypertension    Insomnia    Left breast mass    OCD (obsessive compulsive disorder)    Osteopenia    PONV (postoperative nausea and vomiting)    Psoriatic arthritis ([facility])    PVC (premature ventricular contraction)    Rheumatoid arthritis ([place])     Relevant past medical, surgical, family and social history reviewed and updated as indicated. Interim medical history since our last visit reviewed.  Review of Systems Per HPI unless specifically indicated above     Objective:    BP 140/90   Wt Readings from Last 3 Encounters:  04/29/21 201 lb 9.6 oz (91.4 kg)  04/11/21 203 lb 6.4 oz (92.3 kg)  03/11/21 202 lb 6.4 oz  (91.8 kg)    Physical Exam Physical examination unable to be performed due to telemedicine visit.  Patient talking in complete sentences during telemedicine visit.      Assessment & Plan:  1. Acute non-recurrent pansinusitis Acute x8 days.  Given history of allergy check reaction to amoxicillin, start doxycycline twice daily for 7 days.  Continue to push fluids, use Mucinex as needed, nasal rinses, humidifier.  Follow-up with no improvement.  - doxycycline (VIBRA-TABS) 100 MG tablet; Take 1 tablet (100 mg total) by mouth 2 (two) times daily.  Dispense: 14 tablet; Refill: 0    Follow up plan: Return if symptoms worsen or fail to improve.   This visit was completed via telephone due to the restrictions  of the COVID-19 pandemic. All issues as above were discussed and addressed but no physical exam was performed. If it was felt that the patient should be evaluated in the office, they were directed there. The patient verbally consented to this visit. Patient was unable to complete an audio/visual visit due to Lack of equipment.  Patient did not have access to internet at home. Location of the patient: home Location of the provider: work Those involved with this call:  Provider: Noemi Chapel, DNP, FNP-C CMA: n/a Front Desk/Registration: Santina Evans  Time spent on call:  6 minutes on the phone discussing health concerns. 11 minutes total spent in review of patient's record and preparation of their chart. I verified patient identity using two factors (patient name and date of birth). Patient consents verbally to being seen via telemedicine visit today.

## 2021-05-28 ENCOUNTER — Telehealth: Payer: Self-pay | Admitting: *Deleted

## 2021-05-28 NOTE — Telephone Encounter (Signed)
Ingrezza 80 mg PA, key [id number]. Tardive dyskinesia.  Your information has been sent to [facility].

## 2021-05-28 NOTE — Telephone Encounter (Signed)
INGREZZA CAP 80MG , use as directed, is approved through 05/24/2022 under your Medicare Part D Benefit. Approval letter faxed to [facility].

## 2021-06-04 ENCOUNTER — Other Ambulatory Visit: Payer: Self-pay | Admitting: *Deleted

## 2021-06-04 DIAGNOSIS — Z79899 Other long term (current) drug therapy: Secondary | ICD-10-CM

## 2021-06-04 DIAGNOSIS — R7309 Other abnormal glucose: Secondary | ICD-10-CM

## 2021-06-04 DIAGNOSIS — Z8639 Personal history of other endocrine, nutritional and metabolic disease: Secondary | ICD-10-CM

## 2021-06-05 ENCOUNTER — Other Ambulatory Visit: Payer: Self-pay

## 2021-06-05 ENCOUNTER — Encounter: Payer: Self-pay | Admitting: Physical Medicine & Rehabilitation

## 2021-06-05 ENCOUNTER — Encounter: Payer: Medicare Other | Attending: Physical Medicine & Rehabilitation | Admitting: Physical Medicine & Rehabilitation

## 2021-06-05 VITALS — BP 136/87 | HR 88 | Temp 98.7°F | Ht 63.0 in | Wt 202.6 lb

## 2021-06-05 DIAGNOSIS — G243 Spasmodic torticollis: Secondary | ICD-10-CM | POA: Insufficient documentation

## 2021-06-05 DIAGNOSIS — M961 Postlaminectomy syndrome, not elsewhere classified: Secondary | ICD-10-CM | POA: Insufficient documentation

## 2021-06-05 LAB — COMPLETE METABOLIC PANEL WITH GFR
AG Ratio: 1.6 (calc) (ref 1.0–2.5)
ALT: 7 U/L (ref 6–29)
AST: 13 U/L (ref 10–35)
Albumin: 3.7 g/dL (ref 3.6–5.1)
Alkaline phosphatase (APISO): 105 U/L (ref 37–153)
BUN: 13 mg/dL (ref 7–25)
CO2: 29 mmol/L (ref 20–32)
Calcium: 9.8 mg/dL (ref 8.6–10.4)
Chloride: 97 mmol/L — ABNORMAL LOW (ref 98–110)
Creat: 0.85 mg/dL (ref 0.60–1.00)
Globulin: 2.3 g/dL (calc) (ref 1.9–3.7)
Glucose, Bld: 123 mg/dL — ABNORMAL HIGH (ref 65–99)
Potassium: 3.8 mmol/L (ref 3.5–5.3)
Sodium: 133 mmol/L — ABNORMAL LOW (ref 135–146)
Total Bilirubin: 0.4 mg/dL (ref 0.2–1.2)
Total Protein: 6 g/dL — ABNORMAL LOW (ref 6.1–8.1)
eGFR: 73 mL/min/{1.73_m2} (ref >=60)

## 2021-06-05 LAB — LIPID PANEL
Cholesterol: 191 mg/dL (ref <200)
HDL: 58 mg/dL (ref >=50)
LDL Cholesterol (Calc): 108 mg/dL (calc) — ABNORMAL HIGH
Non-HDL Cholesterol (Calc): 133 mg/dL (calc) — ABNORMAL HIGH (ref <130)
Total CHOL/HDL Ratio: 3.3 (calc) (ref <5.0)
Triglycerides: 133 mg/dL (ref <150)

## 2021-06-05 LAB — CBC WITH DIFFERENTIAL/PLATELET
Absolute Monocytes: 308 cells/uL (ref 200–950)
Basophils Absolute: 51 cells/uL (ref 0–200)
Basophils Relative: 1.1 %
Eosinophils Absolute: 69 cells/uL (ref 15–500)
Eosinophils Relative: 1.5 %
HCT: 32.7 % — ABNORMAL LOW (ref 35.0–45.0)
Hemoglobin: 10.8 g/dL — ABNORMAL LOW (ref 11.7–15.5)
Lymphs Abs: 1730 cells/uL (ref 850–3900)
MCH: 31.6 pg (ref 27.0–33.0)
MCHC: 33 g/dL (ref 32.0–36.0)
MCV: 95.6 fL (ref 80.0–100.0)
MPV: 10.8 fL (ref 7.5–12.5)
Monocytes Relative: 6.7 %
Neutro Abs: 2443 cells/uL (ref 1500–7800)
Neutrophils Relative %: 53.1 %
Platelets: 214 10*3/uL (ref 140–400)
RBC: 3.42 10*6/uL — ABNORMAL LOW (ref 3.80–5.10)
RDW: 13.3 % (ref 11.0–15.0)
Total Lymphocyte: 37.6 %
WBC: 4.6 10*3/uL (ref 3.8–10.8)

## 2021-06-05 LAB — HEMOGLOBIN A1C W/OUT EAG: Hgb A1c MFr Bld: 5.7 % of total Hgb — ABNORMAL HIGH (ref <5.7)

## 2021-06-05 NOTE — Progress Notes (Signed)
Glucose is 123.  Sodium and chloride are slightly low.  Total protein is borderline low.   RBC count, hgb, and hct are low consistent with anemia.   Hgb A1c was 5.7%.   LDL is borderline elevated-108.  Please notify the patient and forward lab work to PCP as requested.

## 2021-06-05 NOTE — Patient Instructions (Signed)
Will re examine you in 46mo when Botox

## 2021-06-05 NOTE — Progress Notes (Addendum)
Subjective:    Patient ID: Janet Peterson, female    DOB: [birth year], 71 y.o.   MRN: [id number]  HPI 71 year old female with history of chronic back pain.  She has undergone C5-C7 ACDF by Dr. Rennis Harding.  She has also undergone T4-S1 fusion as well as revision surgery. 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  04/29/2021 Muscles and dosing:  Left trap 25U Left splenius cap 50U Left semispinalis capitus 25U  The patient notes that her upper back pain has improved since the botulinum toxin injection however she does have some neck weakness and has been using soft collar. Pain Inventory Average Pain 7 Pain Right Now 6 My pain is intermittent, stabbing, and aching  In the last 24 hours, has pain interfered with the following? General activity 7 Relation with others 2 Enjoyment of life 5 What TIME of day is your pain at its worst? morning , daytime, and night Sleep (in general) Fair  Pain is worse with: walking, bending, and standing Pain improves with: rest and medication Relief from Meds: 5  Family History  Problem Relation Age of Onset   Diabetes Mother    Heart disease Mother    Diabetes Father    Anuerysm Father    Diabetes Brother    Heart disease Brother    Diabetes Brother    Heart disease Brother    Breast cancer Neg Hx    Social History   Socioeconomic History   Marital status: Married    Spouse name: Daryl   Number of children: Not on file   Years of education: Not on file   Highest education level: Not on file  Occupational History    Comment: retired  Tobacco Use   Smoking status: Former    Packs/day: 0.50    Years: 10.00    Pack years: 5.00    Types: Cigarettes    Quit date: 03/07/1995    Years since quitting: 26.2   Smokeless tobacco: Never  Vaping Use   Vaping Use: Never used  Substance and Sexual Activity   Alcohol use: Yes    Comment: rare   Drug use: Never    Sexual activity: Not on file  Other Topics Concern   Not on file  Social History Narrative   Lives with husband   Social Determinants of Health   Financial Resource Strain: Low Risk    Difficulty of Paying Living Expenses: Not hard at all  Food Insecurity: No Food Insecurity   Worried About Charity fundraiser in the Last Year: Never true   [place] in the Last Year: Never true  Transportation Needs: No Transportation Needs   Lack of Transportation (Medical): No   Lack of Transportation (Non-Medical): No  Physical Activity: Inactive   Days of Exercise per Week: 0 days   Minutes of Exercise per Session: 0 min  Stress: No Stress Concern Present   Feeling of Stress : Not at all  Social Connections: Moderately Isolated   Frequency of Communication with Friends and Family: More than three times a week   Frequency of Social Gatherings with Friends and Family: Once a week   Attends Religious Services: Never   Marine scientist or Organizations: No   Attends Archivist Meetings: Never   Marital Status: Married   Past Surgical History:  Procedure Laterality Date   BIOPSY  09/29/2019  Procedure: BIOPSY;  Surgeon: Ronnette Juniper, MD;  Location: [facility];  Service: Gastroenterology;;   BREAST EXCISIONAL BIOPSY Right    BREAST EXCISIONAL BIOPSY Left 05/04/2018   BREAST LUMPECTOMY WITH RADIOACTIVE SEED LOCALIZATION Left 05/04/2018   Procedure: LEFT BREAST LUMPECTOMY WITH RADIOACTIVE SEED LOCALIZATION AND LEFT BREAST NIPPLE BIOPSY;  Surgeon: Jovita Kussmaul, MD;  Location: [facility];  Service: General;  Laterality: Left;   BREAST SURGERY  05/25/1973   lumpectomy-- benign   BUNIONECTOMY  05/25/1989   CATARACT EXTRACTION W/ INTRAOCULAR LENS  IMPLANT, BILATERAL  05/25/1998   CERVICAL FUSION  07/24/2011   C5 -- C7   CESAREAN SECTION  05/26/1983   COLONOSCOPY WITH PROPOFOL N/A 09/29/2019   Procedure: COLONOSCOPY WITH PROPOFOL;  Surgeon: Ronnette Juniper, MD;   Location: [facility];  Service: Gastroenterology;  Laterality: N/A;   DISTAL INTERPHALANGEAL JOINT FUSION Right 03/14/2015   Procedure: RIGHT LONG FINGER DISTAL INTERPHALANGEAL JOINT ARTHRODESIS;  Surgeon: Iran Planas, MD;  Location: [facility];  Service: Orthopedics;  Laterality: Right;   ESOPHAGOGASTRODUODENOSCOPY (EGD) WITH PROPOFOL N/A 09/29/2019   Procedure: ESOPHAGOGASTRODUODENOSCOPY (EGD) WITH PROPOFOL;  Surgeon: Ronnette Juniper, MD;  Location: [facility];  Service: Gastroenterology;  Laterality: N/A;   EYE SURGERY  05/25/1993   rk (laser surgery), semi cornea transplant, detacted retina,  fluid removal   HYSTEROSCOPY WITH D & C N/A 12/09/2020   Procedure: DILATATION AND CURETTAGE /HYSTEROSCOPY;  Surgeon: Drema Dallas, DO;  Location: [facility];  Service: Gynecology;  Laterality: N/A;   KNEE ARTHROSCOPY Left 03/03/2004   POLYPECTOMY  09/29/2019   Procedure: POLYPECTOMY;  Surgeon: Ronnette Juniper, MD;  Location: [facility];  Service: Gastroenterology;;   POSTERIOR VITRECTOMY RIGHT EYE AND LASER   10/27/1999   SHOULDER SURGERY Right 05/26/1995   SPINAL FIXATION SURGERY W/ IMPLANT  2013 rod #1//  2014  rod 2   S1 -- T10  (rod #1)//   S1 -- T4 (rod #2)   THORACIC FUSION  03/13/2013   REMOVAL HARDWARE/  BONE GRAFT FUSION T10//  REVISION OF RODS   TOTAL KNEE ARTHROPLASTY Left 12/14/2005   UVULOPALATOPHARYNGOPLASTY  04/26/2006   w/  TONSILLECTOMY/  TURBINATE REDUCTIONS/  BILATERAL ANTERIOR ETHMOIDECTOMY   Past Surgical History:  Procedure Laterality Date   BIOPSY  09/29/2019   Procedure: BIOPSY;  Surgeon: Ronnette Juniper, MD;  [facility];  Service: Gastroenterology;;   BREAST EXCISIONAL BIOPSY Right    BREAST EXCISIONAL BIOPSY Left 05/04/2018   BREAST LUMPECTOMY WITH RADIOACTIVE SEED LOCALIZATION Left 05/04/2018   Procedure: LEFT BREAST LUMPECTOMY WITH RADIOACTIVE SEED LOCALIZATION AND LEFT BREAST NIPPLE BIOPSY;  Surgeon: Jovita Kussmaul, MD;   Location: [facility];  Service: General;  Laterality: Left;   BREAST SURGERY  05/25/1973   lumpectomy-- benign   BUNIONECTOMY  05/25/1989   CATARACT EXTRACTION W/ INTRAOCULAR LENS  IMPLANT, BILATERAL  05/25/1998   CERVICAL FUSION  07/24/2011   C5 -- C7   CESAREAN SECTION  05/26/1983   COLONOSCOPY WITH PROPOFOL N/A 09/29/2019   Procedure: COLONOSCOPY WITH PROPOFOL;  Surgeon: Ronnette Juniper, MD;  [facility];  Service: Gastroenterology;  Laterality: N/A;   DISTAL INTERPHALANGEAL JOINT FUSION Right 03/14/2015   Procedure: RIGHT LONG FINGER DISTAL INTERPHALANGEAL JOINT ARTHRODESIS;  Surgeon: Iran Planas, MD;  Location: [facility];  Service: Orthopedics;  Laterality: Right;   ESOPHAGOGASTRODUODENOSCOPY (EGD) WITH PROPOFOL N/A 09/29/2019   Procedure: ESOPHAGOGASTRODUODENOSCOPY (EGD) WITH PROPOFOL;  Surgeon: Ronnette Juniper, MD;  Location[facility];  Service:  Gastroenterology;  Laterality: N/A;   EYE SURGERY  05/25/1993   rk (laser surgery), semi cornea transplant, detacted retina,  fluid removal   HYSTEROSCOPY WITH D & C N/A 12/09/2020   Procedure: DILATATION AND CURETTAGE /HYSTEROSCOPY;  Surgeon: Drema Dallas, DO;  Location: [facility];  Service: Gynecology;  Laterality: N/A;   KNEE ARTHROSCOPY Left 03/03/2004   POLYPECTOMY  09/29/2019   Procedure: POLYPECTOMY;  Surgeon: Ronnette Juniper, MD;  [facility];  Service: Gastroenterology;;   POSTERIOR VITRECTOMY RIGHT EYE AND LASER   10/27/1999   SHOULDER SURGERY Right 05/26/1995   SPINAL FIXATION SURGERY W/ IMPLANT  2013 rod #1//  2014  rod 2   S1 -- T10  (rod #1)//   S1 -- T4 (rod #2)   THORACIC FUSION  03/13/2013   REMOVAL HARDWARE/  BONE GRAFT FUSION T10//  REVISION OF RODS   TOTAL KNEE ARTHROPLASTY Left 12/14/2005   UVULOPALATOPHARYNGOPLASTY  04/26/2006   w/  TONSILLECTOMY/  TURBINATE REDUCTIONS/  BILATERAL ANTERIOR ETHMOIDECTOMY   Past Medical History:  Diagnosis Date    Ankylosing spondylitis ([place])    Anxiety and depression    Depression    Fibromyalgia    GERD (gastroesophageal reflux disease)    Hiatal hernia    per patient, dx by GI    History of basal cell carcinoma excision    FACE, 1992  &  1996   History of hiatal hernia    History of thrush    Hyperlipidemia    Hypertension    Insomnia    Left breast mass    OCD (obsessive compulsive disorder)    Osteopenia    PONV (postoperative nausea and vomiting)    Psoriatic arthritis ([facility])    PVC (premature ventricular contraction)    Rheumatoid arthritis ([facility])    BP 136/87    Pulse 88    Temp 98.7 F (37.1 C)    Ht 5\' 3"  (1.6 m)    Wt 202 lb 9.6 oz (91.9 kg)    SpO2 94%    BMI 35.89 kg/m   Opioid Risk Score:   Fall Risk Score:  `1  Depression screen PHQ 2/9  Depression screen [facility] 2/9 04/29/2021 03/11/2021 02/11/2021 11/05/2020 10/10/2020 09/24/2020 06/25/2020  Decreased Interest 1 0 1 1 0 0 0  Down, Depressed, Hopeless 1 0 1 1 0 0 0  PHQ - 2 Score 2 0 2 2 0 0 0  Altered sleeping - - - 1 0 0 -  Tired, decreased energy - - - 0 0 0 -  Change in appetite - - - 0 0 0 -  Feeling bad or failure about yourself  - - - 0 0 0 -  Trouble concentrating - - - 0 0 0 -  Moving slowly or fidgety/restless - - - 0 0 0 -  Suicidal thoughts - - - 0 0 0 -  PHQ-9 Score - - - 3 0 0 -  Difficult doing work/chores - - - Not difficult at all Not difficult at all Not difficult at all -  Some recent data might be hidden     Review of Systems  Constitutional: Negative.   HENT: Negative.    Eyes: Negative.   Respiratory: Negative.    Cardiovascular: Negative.   Gastrointestinal: Negative.   Endocrine: Negative.   Genitourinary: Negative.   Musculoskeletal:  Positive for back pain and neck pain.  Skin: Negative.   Allergic/Immunologic: Negative.   Neurological: Negative.   Hematological: Negative.  Psychiatric/Behavioral: Negative.        Objective:   Physical Exam Vitals and nursing note reviewed.   Constitutional:      Appearance: She is obese.  HENT:     Head: Normocephalic and atraumatic.  Eyes:     Extraocular Movements: Extraocular movements intact.     Conjunctiva/sclera: Conjunctivae normal.     Pupils: Pupils are equal, round, and reactive to light.  Neurological:     Mental Status: She is alert and oriented to person, place, and time.     Comments: Motor strength is 5/5 at the deltoid bicep tricep finger flexors and extensors. Tone appears normal in the upper extremities There is increased tone at bilateral PCI there is tenderness palpation along the rhomboids bilaterally as well as thoracic paraspinal area. Gait is without evidence of toe drag or knee instability   Psychiatric:        Mood and Affect: Mood normal.        Behavior: Behavior normal.     Neck extensor weakness     Assessment & Plan:   1.  History of extensive spinal fusions notably C5-C7 as well as T4-S1. 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  We will wait till the current botulinum toxin wears off and have her come back for a visit.  I would expect her NECK extension to improve over the next month or 2.  Would recommend no injection of semispinalis capitis as this is likely cause of her neck extensor weakness.  Patient is taking narcotic analgesics prescribed by her  PCP Noemi Chapel NP

## 2021-06-06 ENCOUNTER — Other Ambulatory Visit: Payer: Self-pay | Admitting: Physician Assistant

## 2021-06-06 NOTE — Telephone Encounter (Signed)
Next Visit: 08/05/2021  Last Visit: 02/27/2021  Last Fill: 05/13/2021  DX: Psoriatic arthritis   Current Dose per office note 02/27/2021 :  Arava 20 mg 1 tablet by mouth daily  Labs: 06/04/2021, Glucose is 123.  Sodium and chloride are slightly low.  Total protein is borderline low.   RBC count, hgb, and hct are low consistent with anemia.   Hgb A1c was 5.7%.   LDL is borderline elevated-108.  Please notify the patient and forward lab work to PCP as requested.   Okay to refill Arava?

## 2021-06-13 ENCOUNTER — Ambulatory Visit: Payer: Medicare Other | Admitting: Physical Medicine & Rehabilitation

## 2021-06-16 ENCOUNTER — Other Ambulatory Visit: Payer: Self-pay

## 2021-06-16 MED ORDER — OXYCODONE-ACETAMINOPHEN 5-325 MG PO TABS: 1.0000 | ORAL_TABLET | Freq: Every day | ORAL | 0 refills | Status: DC

## 2021-06-16 NOTE — Telephone Encounter (Signed)
LOV 04/11/21 Last refill 04/20/21, #60, 0 refills  Please review, thanks!

## 2021-06-20 DIAGNOSIS — K769 Liver disease, unspecified: Secondary | ICD-10-CM | POA: Insufficient documentation

## 2021-06-23 NOTE — Progress Notes (Signed)
KeyGarrel Ridgel - PA Case ID: [id number] Need help? Call us at [phone] Status Sent to [facility] oxyCODONE-Acetaminophen 5-325MG  tablets Form [facility] Medicare Part D Electronic Prior Authorization Form (2017 NCPDP)

## 2021-06-25 ENCOUNTER — Other Ambulatory Visit: Payer: Self-pay

## 2021-06-25 MED ORDER — VALSARTAN 320 MG PO TABS: 320.0000 mg | ORAL_TABLET | Freq: Every day | ORAL | 3 refills | Status: DC

## 2021-06-26 ENCOUNTER — Ambulatory Visit (INDEPENDENT_AMBULATORY_CARE_PROVIDER_SITE_OTHER): Payer: Medicare Other | Admitting: Family Medicine

## 2021-06-26 ENCOUNTER — Other Ambulatory Visit: Payer: Self-pay

## 2021-06-26 ENCOUNTER — Encounter: Payer: Self-pay | Admitting: Family Medicine

## 2021-06-26 VITALS — BP 132/98 | HR 86 | Temp 96.8°F | Resp 18 | Ht 63.0 in | Wt 202.0 lb

## 2021-06-26 DIAGNOSIS — E46 Unspecified protein-calorie malnutrition: Secondary | ICD-10-CM

## 2021-06-26 DIAGNOSIS — D649 Anemia, unspecified: Secondary | ICD-10-CM | POA: Diagnosis not present

## 2021-06-26 MED ORDER — NEOMYCIN-POLYMYXIN-HC 3.5-10000-1 OT SOLN: 3.0000 [drp] | Freq: Four times a day (QID) | OTIC | 0 refills | Status: DC

## 2021-06-26 MED ORDER — TOBRAMYCIN-DEXAMETHASONE 0.3-0.1 % OP SUSP: 1.0000 [drp] | OPHTHALMIC | 0 refills | Status: DC

## 2021-06-26 NOTE — Progress Notes (Signed)
Approved on January 30 Request Reference Number: [id number]. OXYCOD/APAP TAB 5-325MG  is approved through 07/23/2021. Your patient may now fill this prescription and it will be covered.

## 2021-06-26 NOTE — Progress Notes (Signed)
Subjective:    Patient ID: Janet Peterson, female    DOB: 02/08/1951, 71 y.o.   MRN: [id number]  Patient presents today having following approximately 1 week ago.  She landed on the left side of her face.  She did not think much of it.  However, since that time, she has had pain in her left ear.  On examination, the left tympanic membrane is normal in appearance.  She has some wax in the auditory canal however the canal is swollen.  At approximately 7:00, there is a tender area that I am able to palpate with a curette that causes the patient pain.  This area is swollen.  There is no purulence or pustules seen.  There is no vesicles seen.  I do not feel that this has anything to do with fungal but I believe the patient is developing otitis externa.  She also recently had labs drawn her rheumatologist and her hemoglobin has dropped to 10.8 Past Medical History:  Diagnosis Date   Ankylosing spondylitis ([place])    Anxiety and depression    Depression    Fibromyalgia    GERD (gastroesophageal reflux disease)    Hiatal hernia    per patient, dx by GI    History of basal cell carcinoma excision    FACE, 1992  &  1996   History of hiatal hernia    History of thrush    Hyperlipidemia    Hypertension    Insomnia    Left breast mass    OCD (obsessive compulsive disorder)    Osteopenia    PONV (postoperative nausea and vomiting)    Psoriatic arthritis (HCC)    PVC (premature ventricular contraction)    Rheumatoid arthritis ([place])    Past Surgical History:  Procedure Laterality Date   BIOPSY  09/29/2019   Procedure: BIOPSY;  Surgeon: Ronnette Juniper, MD;  [facility];  Service: Gastroenterology;;   BREAST EXCISIONAL BIOPSY Right    BREAST EXCISIONAL BIOPSY Left 05/04/2018   BREAST LUMPECTOMY WITH RADIOACTIVE SEED LOCALIZATION Left 05/04/2018   Procedure: LEFT BREAST LUMPECTOMY WITH RADIOACTIVE SEED LOCALIZATION AND LEFT BREAST NIPPLE BIOPSY;  Surgeon: Jovita Kussmaul, MD;  Location: [facility];  Service: General;  Laterality: Left;   BREAST SURGERY  05/25/1973   lumpectomy-- benign   BUNIONECTOMY  05/25/1989   CATARACT EXTRACTION W/ INTRAOCULAR LENS  IMPLANT, BILATERAL  05/25/1998   CERVICAL FUSION  07/24/2011   C5 -- C7   CESAREAN SECTION  05/26/1983   COLONOSCOPY WITH PROPOFOL N/A 09/29/2019   Procedure: COLONOSCOPY WITH PROPOFOL;  Surgeon: Ronnette Juniper, MD;  [facility];  Service: Gastroenterology;  Laterality: N/A;   DISTAL INTERPHALANGEAL JOINT FUSION Right 03/14/2015   Procedure: RIGHT LONG FINGER DISTAL INTERPHALANGEAL JOINT ARTHRODESIS;  Surgeon: Iran Planas, MD;  Location: [facility];  Service: Orthopedics;  Laterality: Right;   ESOPHAGOGASTRODUODENOSCOPY (EGD) WITH PROPOFOL N/A 09/29/2019   Procedure: ESOPHAGOGASTRODUODENOSCOPY (EGD) WITH PROPOFOL;  Surgeon: Ronnette Juniper, MD;  [facility];  Service: Gastroenterology;  Laterality: N/A;   EYE SURGERY  05/25/1993   rk (laser surgery), semi cornea transplant, detacted retina,  fluid removal   HYSTEROSCOPY WITH D & C N/A 12/09/2020   Procedure: DILATATION AND CURETTAGE /HYSTEROSCOPY;  Surgeon: Drema Dallas, DO;  Location: [facility];  Service: Gynecology;  Laterality: N/A;   KNEE ARTHROSCOPY Left 03/03/2004   POLYPECTOMY  09/29/2019   Procedure: POLYPECTOMY;  Surgeon: Ronnette Juniper, MD;  Location[facility];  Service: Gastroenterology;;   POSTERIOR VITRECTOMY RIGHT EYE AND LASER   10/27/1999   SHOULDER SURGERY Right 05/26/1995   SPINAL FIXATION SURGERY W/ IMPLANT  2013 rod #1//  2014  rod 2   S1 -- T10  (rod #1)//   S1 -- T4 (rod #2)   THORACIC FUSION  03/13/2013   REMOVAL HARDWARE/  BONE GRAFT FUSION T10//  REVISION OF RODS   TOTAL KNEE ARTHROPLASTY Left 12/14/2005   UVULOPALATOPHARYNGOPLASTY  04/26/2006   w/  TONSILLECTOMY/  TURBINATE REDUCTIONS/  BILATERAL ANTERIOR ETHMOIDECTOMY   Current Outpatient Medications on File Prior to Visit   Medication Sig Dispense Refill   buPROPion (WELLBUTRIN XL) 300 MG 24 hr tablet Take 300 mg by mouth every morning.  1   clonazePAM (KLONOPIN) 1 MG tablet TAKE 1 TABLET BY MOUTH EVERY DAY AS NEEDED FOR ANXIETY (Patient taking differently: Take 1 mg by mouth daily as needed for anxiety.) 30 tablet 1   cyclobenzaprine (FLEXERIL) 10 MG tablet TAKE 1 TABLET BY MOUTH THREE TIMES A DAY AS NEEDED FOR MUSCLE SPASMS 30 tablet 0   doxepin (SINEQUAN) 50 MG capsule Take 50 mg by mouth.     ezetimibe (ZETIA) 10 MG tablet TAKE 1 TABLET BY MOUTH EVERY DAY 90 tablet 3   FLUoxetine (PROZAC) 40 MG capsule Take 40 mg by mouth daily.     fluticasone (FLONASE) 50 MCG/ACT nasal spray Place 2 sprays into both nostrils daily as needed for allergies. 16 g 11   hydrochlorothiazide (HYDRODIURIL) 25 MG tablet Take 1 tablet (25 mg total) by mouth daily. 90 tablet 3   INGREZZA 80 MG capsule Take one capsule by mouth daily 30 capsule 5   ipratropium (ATROVENT) 0.06 % nasal spray Place 2 sprays into both nostrils 4 (four) times daily. 15 mL 12   leflunomide (ARAVA) 20 MG tablet TAKE 1 TABLET BY MOUTH EVERY DAY 90 tablet 0   metoprolol succinate (TOPROL-XL) 50 MG 24 hr tablet TAKE 1 TABLET BY MOUTH ONCE DAILY FOLLOWING A MEAL 90 tablet 3   mometasone (ELOCON) 0.1 % cream Apply topically daily. Apply sparingly to affected area daily as needed. 15 g 0   omeprazole (PRILOSEC) 40 MG capsule TAKE 1 CAPSULE BY MOUTH EVERY MORNING AND AT BEDTIME 180 capsule 1   ondansetron (ZOFRAN) 4 MG tablet Take 4 mg by mouth 4 (four) times daily as needed for nausea.     oxyCODONE-acetaminophen (PERCOCET) 5-325 MG tablet Take 1 tablet by mouth at bedtime. 60 tablet 0   QUEtiapine (SEROQUEL) 25 MG tablet Take 25-50 mg by mouth at bedtime.     triamcinolone cream (KENALOG) 0.1 % Apply 1 application topically 2 (two) times daily. 30 g 0   valsartan (DIOVAN) 320 MG tablet Take 1 tablet (320 mg total) by mouth daily. 90 tablet 3   No current  facility-administered medications on file prior to visit.   Allergies  Allergen Reactions   Latex Rash    And Mouth sores Other reaction(s): rash Other reaction(s): rash Other reaction(s): rash   Amlodipine Besy-Benazepril Hcl Other (See Comments)    COUGH   Amoxicillin-Pot Clavulanate Diarrhea and Nausea And Vomiting    Other reaction(s): sick on stomach Other reaction(s): sick on stomach   Bextra [Valdecoxib] Diarrhea and Nausea And Vomiting    REFLUX   Bupropion     Other reaction(s): rash, hives, nausea, high BP , sores, Other reaction(s): rash, hives, nausea, high BP , sores, Other reaction(s): rash, hives, nausea, high BP ,  sores,   Chlorhexidine     rash Other reaction(s): rash all over body Other reaction(s): rash all over body   Lipitor [Atorvastatin Calcium] Other (See Comments)    MYALGIAS   Sulfa Antibiotics Nausea And Vomiting    CRAMPING Other reaction(s): stomach hurts Other reaction(s): stomach hurts   Zocor [Simvastatin] Other (See Comments)    MYALGIA   Social History   Socioeconomic History   Marital status: Married    Spouse name: Daryl   Number of children: Not on file   Years of education: Not on file   Highest education level: Not on file  Occupational History    Comment: retired  Tobacco Use   Smoking status: Former    Packs/day: 0.50    Years: 10.00    Pack years: 5.00    Types: Cigarettes    Quit date: 03/07/1995    Years since quitting: 26.3   Smokeless tobacco: Never  Vaping Use   Vaping Use: Never used  Substance and Sexual Activity   Alcohol use: Yes    Comment: rare   Drug use: Never   Sexual activity: Not on file  Other Topics Concern   Not on file  Social History Narrative   Lives with husband   Social Determinants of Health   Financial Resource Strain: Low Risk    Difficulty of Paying Living Expenses: Not hard at all  Food Insecurity: No Food Insecurity   Worried About Charity fundraiser in the Last Year: Never  true   [place] in the Last Year: Never true  Transportation Needs: No Transportation Needs   Lack of Transportation (Medical): No   Lack of Transportation (Non-Medical): No  Physical Activity: Inactive   Days of Exercise per Week: 0 days   Minutes of Exercise per Session: 0 min  Stress: No Stress Concern Present   Feeling of Stress : Not at all  Social Connections: Moderately Isolated   Frequency of Communication with Friends and Family: More than three times a week   Frequency of Social Gatherings with Friends and Family: Once a week   Attends Religious Services: Never   Marine scientist or Organizations: No   Attends Music therapist: Never   Marital Status: Married  Human resources officer Violence: Not At Risk   Fear of Current or Ex-Partner: No   Emotionally Abused: No   Physically Abused: No   Sexually Abused: No    Review of Systems  Gastrointestinal:  Positive for abdominal pain.  All other systems reviewed and are negative.     Objective:   Physical Exam Vitals reviewed.  Constitutional:      General: She is not in acute distress.    Appearance: Normal appearance. She is normal weight. She is not ill-appearing or toxic-appearing.  HENT:     Right Ear: Tympanic membrane normal. No swelling or tenderness.     Left Ear: Tympanic membrane and external ear normal. No decreased hearing noted. Swelling and tenderness present.  No middle ear effusion. No foreign body. No mastoid tenderness.  Cardiovascular:     Rate and Rhythm: Normal rate and regular rhythm.     Heart sounds: Normal heart sounds. No murmur heard.   No friction rub. No gallop.  Pulmonary:     Effort: Pulmonary effort is normal. No respiratory distress.     Breath sounds: Normal breath sounds. No wheezing, rhonchi or rales.  Abdominal:     General: Abdomen is flat.  Bowel sounds are normal. There is no distension.     Palpations: Abdomen is soft.     Tenderness: There is no abdominal  tenderness.  Musculoskeletal:     Thoracic back: Deformity, spasms and tenderness present. No swelling. Decreased range of motion.     Lumbar back: Tenderness present. Decreased range of motion.       Back:     Right lower leg: No edema.     Left lower leg: No edema.  Neurological:     Mental Status: She is alert.          Assessment & Plan:  Anemia, unspecified type - Plan: Iron, TIBC and Ferritin Panel, Vitamin B12, Fecal Globin By Immunochemistry  Protein-calorie malnutrition, unspecified severity ([place]) - Plan: Vitamin B12 Regarding the anemia, I will check an iron level, B12 level, I will check her stool for blood.  She has low protein levels on her last blood work we will check a vitamin B12 level.  I suspect a nutritional anemia.  I believe the tenderness in her ear is likely a mild otitis externa and I will treat this with Cortisporin HC otic 3 drops 4 times a day for the next week.  Recheck if worsening.

## 2021-06-27 LAB — IRON,TIBC AND FERRITIN PANEL
%SAT: 31 % (calc) (ref 16–45)
Ferritin: 45 ng/mL (ref 16–288)
Iron: 118 ug/dL (ref 45–160)
TIBC: 381 mcg/dL (calc) (ref 250–450)

## 2021-06-27 LAB — VITAMIN B12: Vitamin B-12: 428 pg/mL (ref 200–1100)

## 2021-07-02 ENCOUNTER — Other Ambulatory Visit: Payer: Medicare Other

## 2021-07-03 LAB — FECAL GLOBIN BY IMMUNOCHEMISTRY
FECAL GLOBIN RESULT:: NOT DETECTED
MICRO NUMBER:: 12981067
SPECIMEN QUALITY:: ADEQUATE

## 2021-07-12 ENCOUNTER — Other Ambulatory Visit: Payer: Self-pay | Admitting: Family Medicine

## 2021-07-14 ENCOUNTER — Other Ambulatory Visit: Payer: Self-pay | Admitting: Diagnostic Neuroimaging

## 2021-07-22 ENCOUNTER — Other Ambulatory Visit: Payer: Self-pay

## 2021-07-22 ENCOUNTER — Telehealth: Payer: Self-pay | Admitting: Pharmacist

## 2021-07-22 NOTE — Telephone Encounter (Signed)
LOV 06/26/21 Last refill 06/16/21, #60, 0 refills  Patient states her insurance will only cover 30D supply now. Rx changed to reflect this.  Please review, thanks!

## 2021-07-22 NOTE — Progress Notes (Signed)
Chronic Care Management Pharmacy Assistant   Name: Janet Peterson  MRN: [id number] DOB: April 07, 1951   Reason for Encounter: General Adherence Call    Recent office visits:  06/26/2021 OV (PCP) Susy Frizzle, MD;  I believe the tenderness in her ear is likely a mild otitis externa and I will treat this with Cortisporin HC otic 3 drops 4 times a day for the next week.  Recheck if worsening  05/21/2021 VV Noemi Chapel A, NP; start doxycycline twice daily for 7 days.  Continue to push fluids, use Mucinex as needed, nasal rinses, humidifier.  Follow-up with no improvement  04/11/2021 OV Eulogio Bear, NP; Start Valtrex for possible herpes.  Can use topical steroid cream as needed to help relieve itch.  Recent consult visits:  06/05/2021 OV (Phys Med) Charlett Blake, MD; no medication changes indicated.  04/29/2021 OV (Phys Med) Charlett Blake, MD; no medication changes indicated.  Hospital visits:  None in previous 6 months  Medications: Outpatient Encounter Medications as of 07/22/2021  Medication Sig Note   buPROPion (WELLBUTRIN XL) 300 MG 24 hr tablet Take 300 mg by mouth every morning. 01/15/2021: For depression   clonazePAM (KLONOPIN) 1 MG tablet TAKE 1 TABLET BY MOUTH EVERY DAY AS NEEDED FOR ANXIETY (Patient taking differently: Take 1 mg by mouth daily as needed for anxiety.) 11/05/2020: Fill date 10/27/20 #30 today # 21 last taken last pm   cyclobenzaprine (FLEXERIL) 10 MG tablet TAKE 1 TABLET BY MOUTH THREE TIMES A DAY AS NEEDED FOR MUSCLE SPASMS    doxepin (SINEQUAN) 50 MG capsule Take 50 mg by mouth. 01/15/2021: Nightly for sleep   ezetimibe (ZETIA) 10 MG tablet TAKE 1 TABLET BY MOUTH EVERY DAY    FLUoxetine (PROZAC) 40 MG capsule Take 40 mg by mouth daily.    fluticasone (FLONASE) 50 MCG/ACT nasal spray Place 2 sprays into both nostrils daily as needed for allergies.    hydrochlorothiazide (HYDRODIURIL) 25 MG tablet Take 1 tablet (25 mg total) by mouth  daily.    INGREZZA 80 MG capsule Take one capsule by mouth daily    ipratropium (ATROVENT) 0.06 % nasal spray Place 2 sprays into both nostrils 4 (four) times daily.    leflunomide (ARAVA) 20 MG tablet TAKE 1 TABLET BY MOUTH EVERY DAY    metoprolol succinate (TOPROL-XL) 50 MG 24 hr tablet TAKE 1 TABLET BY MOUTH ONCE DAILY FOLLOWING A MEAL    mometasone (ELOCON) 0.1 % cream Apply topically daily. Apply sparingly to affected area daily as needed.    neomycin-polymyxin-hydrocortisone (CORTISPORIN) OTIC solution Place 3 drops into the left ear 4 (four) times daily.    omeprazole (PRILOSEC) 40 MG capsule TAKE 1 CAPSULE BY MOUTH EVERY MORNING AND AT BEDTIME    ondansetron (ZOFRAN) 4 MG tablet Take 4 mg by mouth 4 (four) times daily as needed for nausea.    oxyCODONE-acetaminophen (PERCOCET) 5-325 MG tablet Take 1 tablet by mouth at bedtime.    QUEtiapine (SEROQUEL) 25 MG tablet Take 25-50 mg by mouth at bedtime.    tobramycin-dexamethasone (TOBRADEX) ophthalmic solution Place 1 drop into the left eye every 4 (four) hours while awake.    triamcinolone cream (KENALOG) 0.1 % APPLY TO AFFECTED AREA TWICE A DAY    valsartan (DIOVAN) 320 MG tablet Take 1 tablet (320 mg total) by mouth daily.    No facility-administered encounter medications on file as of 07/22/2021.   Patient Questions: Have you had any problems recently with your  health? Patient states she has not had any recent problems with her health other than an minor ear infection that has resolved.  Have you had any problems with your pharmacy? Patient states she has not had any problems with her pharmacy.  What issues or side effects are you having with your medications? Patient states she does not have any issues or side effects from any of her medications at this time.  What would you like me to pass along to Leata Mouse, CPP for him to help you with?  Patient states she had labs completed as he suggested.  What can we do to take care of  you better? Patient did not have any suggestions. She states she is happy with her current level of care.  Care Gaps: Medicare Annual Wellness: Overdue - scheduled appt for 08/07/21 Hemoglobin A1C: 5.7% on 06/04/2021 Colonoscopy: Next due on 09/28/2029 Dexa Scan: Completed Mammogram: Next due on 04/03/2023  Future Appointments  Date Time Provider [place]  07/31/2021  2:15 PM Kirsteins, Luanna Salk, MD [facility] CPR  08/05/2021  2:15 PM Bo Merino, MD [facility] None  08/07/2021  2:00 PM BSFM-NURSE HEALTH ADVISOR [facility] None  10/23/2021  3:45 PM BSFM-CCM PHARMACIST [facility] None    [facility]: Valsartan 320 mg last filled 06/25/2021 90 DS  April D Calhoun, [place] Pharmacist Assistant [phone]

## 2021-07-23 ENCOUNTER — Telehealth: Payer: Self-pay

## 2021-07-23 MED ORDER — OXYCODONE-ACETAMINOPHEN 5-325 MG PO TABS: 1.0000 | ORAL_TABLET | Freq: Every day | ORAL | 0 refills | Status: DC

## 2021-07-23 NOTE — Progress Notes (Unsigned)
Office Visit Note  Patient: Janet Peterson             Date of Birth: 1951/02/09           MRN: [id number]             PCP: Susy Frizzle, MD Referring: Susy Frizzle, MD Visit Date: 08/05/2021 Occupation: @GUAROCC @  Subjective:  No chief complaint on file.   History of Present Illness: Janet Peterson is a 71 y.o. female ***   Activities of Daily Living:  Patient reports morning stiffness for *** {minute/hour:19697}.   Patient {ACTIONS;DENIES/REPORTS:21021675::"Denies"} nocturnal pain.  Difficulty dressing/grooming: {ACTIONS;DENIES/REPORTS:21021675::"Denies"} Difficulty climbing stairs: {ACTIONS;DENIES/REPORTS:21021675::"Denies"} Difficulty getting out of chair: {ACTIONS;DENIES/REPORTS:21021675::"Denies"} Difficulty using hands for taps, buttons, cutlery, and/or writing: {ACTIONS;DENIES/REPORTS:21021675::"Denies"}  No Rheumatology ROS completed.   PMFS History:  Patient Active Problem List   Diagnosis Date Noted   Lesion of liver 06/20/2021   C. difficile colitis 09/29/2019   Colitis 09/28/2019   Anxiety and depression 09/28/2019   DDD (degenerative disc disease), cervical s/p fusion 11/12/2016   H/O total knee replacement, left 05/06/2016   DDD lumbar spine status post fusion 05/06/2016   Psoriasis 05/05/2016   High risk medication use 05/05/2016   Cervical post-laminectomy syndrome 12/09/2015   Thoracic postlaminectomy syndrome 12/09/2015   Psoriatic arthritis ([place]) 08/23/2012   Osteopenia 08/23/2012   HLD (hyperlipidemia) 08/23/2012   RLS (restless legs syndrome) 08/23/2012   Insomnia 08/23/2012   Fibromyalgia syndrome 08/12/2012   Low back pain 08/12/2012   Syncope    PVC (premature ventricular contraction)    OCD (obsessive compulsive disorder)    GERD (gastroesophageal reflux disease)    Hypertension     Past Medical History:  Diagnosis Date   Ankylosing spondylitis (HCC)    Anxiety and depression    Depression    Fibromyalgia    GERD  (gastroesophageal reflux disease)    Hiatal hernia    per patient, dx by GI    History of basal cell carcinoma excision    FACE, 1992  &  1996   History of hiatal hernia    History of thrush    Hyperlipidemia    Hypertension    Insomnia    Left breast mass    OCD (obsessive compulsive disorder)    Osteopenia    PONV (postoperative nausea and vomiting)    Psoriatic arthritis (HCC)    PVC (premature ventricular contraction)    Rheumatoid arthritis ([place])     Family History  Problem Relation Age of Onset   Diabetes Mother    Heart disease Mother    Diabetes Father    Anuerysm Father    Diabetes Brother    Heart disease Brother    Diabetes Brother    Heart disease Brother    Breast cancer Neg Hx    Past Surgical History:  Procedure Laterality Date   BIOPSY  09/29/2019   Procedure: BIOPSY;  Surgeon: Ronnette Juniper, MD;  [facility];  Service: Gastroenterology;;   BREAST EXCISIONAL BIOPSY Right    BREAST EXCISIONAL BIOPSY Left 05/04/2018   BREAST LUMPECTOMY WITH RADIOACTIVE SEED LOCALIZATION Left 05/04/2018   Procedure: LEFT BREAST LUMPECTOMY WITH RADIOACTIVE SEED LOCALIZATION AND LEFT BREAST NIPPLE BIOPSY;  Surgeon: Jovita Kussmaul, MD;  Location: [facility];  Service: General;  Laterality: Left;   BREAST SURGERY  05/25/1973   lumpectomy-- benign   BUNIONECTOMY  05/25/1989   CATARACT EXTRACTION W/ INTRAOCULAR LENS  IMPLANT, BILATERAL  05/25/1998  CERVICAL FUSION  07/24/2011   C5 -- C7   CESAREAN SECTION  05/26/1983   COLONOSCOPY WITH PROPOFOL N/A 09/29/2019   Procedure: COLONOSCOPY WITH PROPOFOL;  Surgeon: Ronnette Juniper, MD;  [facility];  Service: Gastroenterology;  Laterality: N/A;   DISTAL INTERPHALANGEAL JOINT FUSION Right 03/14/2015   Procedure: RIGHT LONG FINGER DISTAL INTERPHALANGEAL JOINT ARTHRODESIS;  Surgeon: Iran Planas, MD;  Location: [facility];  Service: Orthopedics;  Laterality: Right;    ESOPHAGOGASTRODUODENOSCOPY (EGD) WITH PROPOFOL N/A 09/29/2019   Procedure: ESOPHAGOGASTRODUODENOSCOPY (EGD) WITH PROPOFOL;  Surgeon: Ronnette Juniper, MD;  [facility];  Service: Gastroenterology;  Laterality: N/A;   EYE SURGERY  05/25/1993   rk (laser surgery), semi cornea transplant, detacted retina,  fluid removal   HYSTEROSCOPY WITH D & C N/A 12/09/2020   Procedure: DILATATION AND CURETTAGE /HYSTEROSCOPY;  Surgeon: Drema Dallas, DO;  Location: [facility];  Service: Gynecology;  Laterality: N/A;   KNEE ARTHROSCOPY Left 03/03/2004   POLYPECTOMY  09/29/2019   Procedure: POLYPECTOMY;  Surgeon: Ronnette Juniper, MD;  [facility];  Service: Gastroenterology;;   POSTERIOR VITRECTOMY RIGHT EYE AND LASER   10/27/1999   SHOULDER SURGERY Right 05/26/1995   SPINAL FIXATION SURGERY W/ IMPLANT  2013 rod #1//  2014  rod 2   S1 -- T10  (rod #1)//   S1 -- T4 (rod #2)   THORACIC FUSION  03/13/2013   REMOVAL HARDWARE/  BONE GRAFT FUSION T10//  REVISION OF RODS   TOTAL KNEE ARTHROPLASTY Left 12/14/2005   UVULOPALATOPHARYNGOPLASTY  04/26/2006   w/  TONSILLECTOMY/  TURBINATE REDUCTIONS/  BILATERAL ANTERIOR ETHMOIDECTOMY   Social History   Social History Narrative   Lives with husband   Immunization History  Administered Date(s) Administered   Fluad Quad(high Dose 65+) 01/26/2019, 03/05/2020   Influenza Whole 03/04/2012   Influenza, High Dose Seasonal PF 03/25/2018   Influenza,inj,Quad PF,6+ Mos 02/28/2013, 03/07/2014, 02/28/2015, 03/27/2016, 03/09/2017   Influenza-Unspecified 04/08/2021   Pneumococcal Conjugate-13 09/10/2016   Pneumococcal Polysaccharide-23 03/12/2006, 11/09/2011   Unspecified SARS-COV-2 Vaccination 08/07/2019, 08/28/2019, 03/08/2020     Objective: Vital Signs: There were no vitals taken for this visit.   Physical Exam   Musculoskeletal Exam: ***  CDAI Exam: CDAI Score: -- Patient Global: --; Provider Global: -- Swollen: --; Tender: -- Joint  Exam 08/05/2021   No joint exam has been documented for this visit   There is currently no information documented on the homunculus. Go to the Rheumatology activity and complete the homunculus joint exam.  Investigation: No additional findings.  Imaging: No results found.  Recent Labs: Lab Results  Component Value Date   WBC 4.6 06/04/2021   HGB 10.8 (L) 06/04/2021   PLT 214 06/04/2021   NA 133 (L) 06/04/2021   K 3.8 06/04/2021   CL 97 (L) 06/04/2021   CO2 29 06/04/2021   GLUCOSE 123 (H) 06/04/2021   BUN 13 06/04/2021   CREATININE 0.85 06/04/2021   BILITOT 0.4 06/04/2021   ALKPHOS 118 04/25/2020   AST 13 06/04/2021   ALT 7 06/04/2021   PROT 6.0 (L) 06/04/2021   ALBUMIN 3.5 04/25/2020   CALCIUM 9.8 06/04/2021   GFRAA 82 09/26/2020   QFTBGOLD Indeterminate 08/14/2016   QFTBGOLDPLUS Negative 12/22/2018    Speciality Comments: Simponi Aria every 8 weeks started Jan 2018  TB gold negative 08/19/16  Procedures:  No procedures performed Allergies: Latex, Amlodipine besy-benazepril hcl, Amoxicillin-pot clavulanate, Bextra [valdecoxib], Bupropion, Chlorhexidine, Lipitor [atorvastatin calcium], Sulfa antibiotics, and Zocor [simvastatin]  Assessment / Plan:     Visit Diagnoses: No diagnosis found.  Orders: No orders of the defined types were placed in this encounter.  No orders of the defined types were placed in this encounter.   Face-to-face time spent with patient was *** minutes. Greater than 50% of time was spent in counseling and coordination of care.  Follow-Up Instructions: No follow-ups on file.   Earnestine Mealing, CMA  Note - This record has been created using Editor, commissioning.  Chart creation errors have been sought, but may not always  have been located. Such creation errors do not reflect on  the standard of medical care.

## 2021-07-23 NOTE — Telephone Encounter (Signed)
Fax received from [facility] stating Cyclobenzaprine is not on patient's formulary list. The patient did receive a one-time 30D supply of the medication.  ? ?They are recommending the following alternatives: ?Tizanidine ?Ibuprofen ?Naproxen ?Etodolac ?Flubiprofen ? ?Please advise, thank you! ?

## 2021-07-23 NOTE — Telephone Encounter (Signed)
LMTRC

## 2021-07-24 ENCOUNTER — Other Ambulatory Visit: Payer: Self-pay

## 2021-07-24 ENCOUNTER — Ambulatory Visit (INDEPENDENT_AMBULATORY_CARE_PROVIDER_SITE_OTHER): Payer: Medicare Other | Admitting: Family Medicine

## 2021-07-24 ENCOUNTER — Encounter: Payer: Self-pay | Admitting: Family Medicine

## 2021-07-24 VITALS — BP 142/102 | HR 79 | Temp 97.0°F | Resp 18 | Ht 63.0 in | Wt 202.0 lb

## 2021-07-24 DIAGNOSIS — J40 Bronchitis, not specified as acute or chronic: Secondary | ICD-10-CM

## 2021-07-24 DIAGNOSIS — I10 Essential (primary) hypertension: Secondary | ICD-10-CM | POA: Diagnosis not present

## 2021-07-24 MED ORDER — TIZANIDINE HCL 4 MG PO CAPS: 4.0000 mg | ORAL_CAPSULE | Freq: Three times a day (TID) | ORAL | 1 refills | Status: DC

## 2021-07-24 MED ORDER — AZITHROMYCIN 250 MG PO TABS: ORAL_TABLET | ORAL | 0 refills | Status: DC

## 2021-07-24 MED ORDER — DOXAZOSIN MESYLATE 2 MG PO TABS: 2.0000 mg | ORAL_TABLET | Freq: Every day | ORAL | 3 refills | Status: DC

## 2021-07-24 MED ORDER — HYDROCODONE BIT-HOMATROP MBR 5-1.5 MG/5ML PO SOLN: 5.0000 mL | Freq: Three times a day (TID) | ORAL | 0 refills | Status: DC | PRN

## 2021-07-24 NOTE — Telephone Encounter (Signed)
Spoke with patient and advised of medication change. Rx sent to pharmacy. Nothing further needed at this time.  ? ?

## 2021-07-24 NOTE — Progress Notes (Signed)
Subjective:    Patient ID: Janet Peterson, female    DOB: [birth year], 71 y.o.   MRN: [id number]  I recently saw the patient's husband for bronchitis.  Patient states for the last week she has had similar symptoms.  She reports no fever but she reports cough productive of yellow mucus.  She reports wheezing at night when she lies down.  She reports head congestion and rhinorrhea.  She denies any chest pain or shortness of breath.  She denies any sinus pain.  She denies any sore throat. Past Medical History:  Diagnosis Date   Ankylosing spondylitis ([place])    Anxiety and depression    Depression    Fibromyalgia    GERD (gastroesophageal reflux disease)    Hiatal hernia    per patient, dx by GI    History of basal cell carcinoma excision    FACE, 1992  &  1996   History of hiatal hernia    History of thrush    Hyperlipidemia    Hypertension    Insomnia    Left breast mass    OCD (obsessive compulsive disorder)    Osteopenia    PONV (postoperative nausea and vomiting)    Psoriatic arthritis (HCC)    PVC (premature ventricular contraction)    Rheumatoid arthritis ([place])    Past Surgical History:  Procedure Laterality Date   BIOPSY  09/29/2019   Procedure: BIOPSY;  Surgeon: Ronnette Juniper, MD;  [facility];  Service: Gastroenterology;;   BREAST EXCISIONAL BIOPSY Right    BREAST EXCISIONAL BIOPSY Left 05/04/2018   BREAST LUMPECTOMY WITH RADIOACTIVE SEED LOCALIZATION Left 05/04/2018   Procedure: LEFT BREAST LUMPECTOMY WITH RADIOACTIVE SEED LOCALIZATION AND LEFT BREAST NIPPLE BIOPSY;  Surgeon: Jovita Kussmaul, MD;  Location: [place];  Service: General;  Laterality: Left;   BREAST SURGERY  05/25/1973   lumpectomy-- benign   BUNIONECTOMY  05/25/1989   CATARACT EXTRACTION W/ INTRAOCULAR LENS  IMPLANT, BILATERAL  05/25/1998   CERVICAL FUSION  07/24/2011   C5 -- C7   CESAREAN SECTION  05/26/1983   COLONOSCOPY WITH PROPOFOL N/A 09/29/2019   Procedure: COLONOSCOPY  WITH PROPOFOL;  Surgeon: Ronnette Juniper, MD;  [facility];  Service: Gastroenterology;  Laterality: N/A;   DISTAL INTERPHALANGEAL JOINT FUSION Right 03/14/2015   Procedure: RIGHT LONG FINGER DISTAL INTERPHALANGEAL JOINT ARTHRODESIS;  Surgeon: Iran Planas, MD;  Location: [place];  Service: Orthopedics;  Laterality: Right;   ESOPHAGOGASTRODUODENOSCOPY (EGD) WITH PROPOFOL N/A 09/29/2019   Procedure: ESOPHAGOGASTRODUODENOSCOPY (EGD) WITH PROPOFOL;  Surgeon: Ronnette Juniper, MD;  [facility];  Service: Gastroenterology;  Laterality: N/A;   EYE SURGERY  05/25/1993   rk (laser surgery), semi cornea transplant, detacted retina,  fluid removal   HYSTEROSCOPY WITH D & C N/A 12/09/2020   Procedure: DILATATION AND CURETTAGE /HYSTEROSCOPY;  Surgeon: Drema Dallas, DO;  Location: [place];  Service: Gynecology;  Laterality: N/A;   KNEE ARTHROSCOPY Left 03/03/2004   POLYPECTOMY  09/29/2019   Procedure: POLYPECTOMY;  Surgeon: Ronnette Juniper, MD;  [facility];  Service: Gastroenterology;;   POSTERIOR VITRECTOMY RIGHT EYE AND LASER   10/27/1999   SHOULDER SURGERY Right 05/26/1995   SPINAL FIXATION SURGERY W/ IMPLANT  2013 rod #1//  2014  rod 2   S1 -- T10  (rod #1)//   S1 -- T4 (rod #2)   THORACIC FUSION  03/13/2013   REMOVAL HARDWARE/  BONE GRAFT FUSION T10//  REVISION OF RODS  TOTAL KNEE ARTHROPLASTY Left 12/14/2005   UVULOPALATOPHARYNGOPLASTY  04/26/2006   w/  TONSILLECTOMY/  TURBINATE REDUCTIONS/  BILATERAL ANTERIOR ETHMOIDECTOMY   Current Outpatient Medications on File Prior to Visit  Medication Sig Dispense Refill   buPROPion (WELLBUTRIN XL) 300 MG 24 hr tablet Take 300 mg by mouth every morning.  1   clonazePAM (KLONOPIN) 1 MG tablet TAKE 1 TABLET BY MOUTH EVERY DAY AS NEEDED FOR ANXIETY (Patient taking differently: Take 1 mg by mouth daily as needed for anxiety.) 30 tablet 1   doxepin (SINEQUAN) 50 MG capsule Take 50 mg by mouth.      ezetimibe (ZETIA) 10 MG tablet TAKE 1 TABLET BY MOUTH EVERY DAY 90 tablet 3   FLUoxetine (PROZAC) 40 MG capsule Take 40 mg by mouth daily.     fluticasone (FLONASE) 50 MCG/ACT nasal spray Place 2 sprays into both nostrils daily as needed for allergies. 16 g 11   hydrochlorothiazide (HYDRODIURIL) 25 MG tablet Take 1 tablet (25 mg total) by mouth daily. 90 tablet 3   INGREZZA 80 MG capsule Take one capsule by mouth daily 30 capsule 1   ipratropium (ATROVENT) 0.06 % nasal spray Place 2 sprays into both nostrils 4 (four) times daily. 15 mL 12   leflunomide (ARAVA) 20 MG tablet TAKE 1 TABLET BY MOUTH EVERY DAY 90 tablet 0   metoprolol succinate (TOPROL-XL) 50 MG 24 hr tablet TAKE 1 TABLET BY MOUTH ONCE DAILY FOLLOWING A MEAL 90 tablet 3   mometasone (ELOCON) 0.1 % cream Apply topically daily. Apply sparingly to affected area daily as needed. 15 g 0   neomycin-polymyxin-hydrocortisone (CORTISPORIN) OTIC solution Place 3 drops into the left ear 4 (four) times daily. 10 mL 0   omeprazole (PRILOSEC) 40 MG capsule TAKE 1 CAPSULE BY MOUTH EVERY MORNING AND AT BEDTIME 180 capsule 1   ondansetron (ZOFRAN) 4 MG tablet Take 4 mg by mouth 4 (four) times daily as needed for nausea.     oxyCODONE-acetaminophen (PERCOCET) 5-325 MG tablet Take 1 tablet by mouth at bedtime. 30 tablet 0   QUEtiapine (SEROQUEL) 25 MG tablet Take 25-50 mg by mouth at bedtime.     tiZANidine (ZANAFLEX) 4 MG capsule Take 1 capsule (4 mg total) by mouth 3 (three) times daily. 30 capsule 1   tobramycin-dexamethasone (TOBRADEX) ophthalmic solution Place 1 drop into the left eye every 4 (four) hours while awake. 5 mL 0   triamcinolone cream (KENALOG) 0.1 % APPLY TO AFFECTED AREA TWICE A DAY 30 g 0   valsartan (DIOVAN) 320 MG tablet Take 1 tablet (320 mg total) by mouth daily. 90 tablet 3   No current facility-administered medications on file prior to visit.   Allergies  Allergen Reactions   Latex Rash    And Mouth sores Other  reaction(s): rash Other reaction(s): rash Other reaction(s): rash   Amlodipine Besy-Benazepril Hcl Other (See Comments)    COUGH   Amoxicillin-Pot Clavulanate Diarrhea and Nausea And Vomiting    Other reaction(s): sick on stomach Other reaction(s): sick on stomach   Bextra [Valdecoxib] Diarrhea and Nausea And Vomiting    REFLUX   Bupropion     Other reaction(s): rash, hives, nausea, high BP , sores, Other reaction(s): rash, hives, nausea, high BP , sores, Other reaction(s): rash, hives, nausea, high BP , sores,   Chlorhexidine     rash Other reaction(s): rash all over body Other reaction(s): rash all over body   Lipitor [Atorvastatin Calcium] Other (See Comments)    MYALGIAS  Sulfa Antibiotics Nausea And Vomiting    CRAMPING Other reaction(s): stomach hurts Other reaction(s): stomach hurts   Zocor [Simvastatin] Other (See Comments)    MYALGIA   Social History   Socioeconomic History   Marital status: Married    Spouse name: Daryl   Number of children: Not on file   Years of education: Not on file   Highest education level: Not on file  Occupational History    Comment: retired  Tobacco Use   Smoking status: Former    Packs/day: 0.50    Years: 10.00    Pack years: 5.00    Types: Cigarettes    Quit date: 03/07/1995    Years since quitting: 26.4   Smokeless tobacco: Never  Vaping Use   Vaping Use: Never used  Substance and Sexual Activity   Alcohol use: Yes    Comment: rare   Drug use: Never   Sexual activity: Not on file  Other Topics Concern   Not on file  Social History Narrative   Lives with husband   Social Determinants of Health   Financial Resource Strain: Low Risk    Difficulty of Paying Living Expenses: Not hard at all  Food Insecurity: No Food Insecurity   Worried About Charity fundraiser in the Last Year: Never true   [place] in the Last Year: Never true  Transportation Needs: No Transportation Needs   Lack of Transportation  (Medical): No   Lack of Transportation (Non-Medical): No  Physical Activity: Inactive   Days of Exercise per Week: 0 days   Minutes of Exercise per Session: 0 min  Stress: No Stress Concern Present   Feeling of Stress : Not at all  Social Connections: Moderately Isolated   Frequency of Communication with Friends and Family: More than three times a week   Frequency of Social Gatherings with Friends and Family: Once a week   Attends Religious Services: Never   Marine scientist or Organizations: No   Attends Music therapist: Never   Marital Status: Married  Human resources officer Violence: Not At Risk   Fear of Current or Ex-Partner: No   Emotionally Abused: No   Physically Abused: No   Sexually Abused: No    Review of Systems  Gastrointestinal:  Positive for abdominal pain.  All other systems reviewed and are negative.     Objective:   Physical Exam Vitals reviewed.  Constitutional:      General: She is not in acute distress.    Appearance: Normal appearance. She is normal weight. She is not ill-appearing or toxic-appearing.  HENT:     Right Ear: Tympanic membrane normal. No swelling or tenderness.     Left Ear: Tympanic membrane and external ear normal. No decreased hearing noted. Swelling and tenderness present.  No middle ear effusion. No foreign body. No mastoid tenderness.  Cardiovascular:     Rate and Rhythm: Normal rate and regular rhythm.     Heart sounds: Normal heart sounds. No murmur heard.   No friction rub. No gallop.  Pulmonary:     Effort: Pulmonary effort is normal. No respiratory distress.     Breath sounds: Wheezing present. No rhonchi or rales.  Abdominal:     General: Abdomen is flat. Bowel sounds are normal. There is no distension.     Palpations: Abdomen is soft.     Tenderness: There is no abdominal tenderness.  Musculoskeletal:     Lumbar back: Tenderness present.  Right lower leg: No edema.     Left lower leg: No edema.   Neurological:     Mental Status: She is alert.          Assessment & Plan:  Bronchitis  Benign essential HTN Patient's blood pressure is elevated.  She cannot tolerate amlodipine so I will add doxazosin 2 mg a day to her other antihypertensives.  Recheck blood pressure in 2 weeks.  I believe the patient has bronchitis.  I explained that this is most likely a virus and will take tincture of time.  I recommend Coricidin HBP with Mucinex DM.  She can use Hycodan for her cough.  If symptoms worsen I did give patient prescription for Z-Pak strict instructions not to fill unless symptoms worsen.

## 2021-07-31 ENCOUNTER — Encounter: Payer: Medicare Other | Attending: Physical Medicine & Rehabilitation | Admitting: Physical Medicine & Rehabilitation

## 2021-07-31 ENCOUNTER — Encounter: Payer: Self-pay | Admitting: Physical Medicine & Rehabilitation

## 2021-07-31 ENCOUNTER — Other Ambulatory Visit: Payer: Self-pay

## 2021-07-31 VITALS — BP 134/91 | HR 88 | Ht 63.0 in | Wt 199.0 lb

## 2021-07-31 DIAGNOSIS — G243 Spasmodic torticollis: Secondary | ICD-10-CM | POA: Diagnosis not present

## 2021-07-31 NOTE — Patient Instructions (Signed)
[facility] ? ?[employer] ?

## 2021-07-31 NOTE — Progress Notes (Signed)
Subjective:    Patient ID: TONGA PROUT, female    DOB: Dec 05, 1950, 71 y.o.   MRN: [id number]  HPI 71 year old female with history of T4-S1 fusion as well as C5-C7 ACDF.  She has chronic lower neck upper back pain.  We discussed that her fusions have altered the biomechanics of her spine and concentrating all movement in the area between her cervical and thoracic fusions.  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  Her weakness lasted about 2-1/2 months postinjection. We discussed that specifically if we were to repeat the Botox, I would not inject the semispinalis capitis and i just use 25 units to the left trapezius and 50 units to the left splenius capitis. She wished to discuss other treatment alternatives.  We discussed that she has already tried PT as well as dry needling which was helpful for the time she received it but not beyond the treatment time itself. We discussed oral medications for spasticity and dystonia which are likely not to be very effective and have a high likelihood of causing side effects such as increased falls as well as impaired cognition. Pain Inventory Average Pain 7 Pain Right Now 6 My pain is intermittent and sharp  In the last 24 hours, has pain interfered with the following? General activity 7 Relation with others 7 Enjoyment of life 7 What TIME of day is your pain at its worst? morning , daytime, and evening Sleep (in general) Good  Pain is worse with: walking, bending, standing, and some activites Pain improves with: rest, medication, and injections Relief from Meds: 7  Family History  Problem Relation Age of Onset   Diabetes Mother    Heart disease Mother    Diabetes Father    Anuerysm Father    Diabetes Brother    Heart disease Brother    Diabetes Brother    Heart disease Brother    Breast cancer Neg Hx    Social History    Socioeconomic History   Marital status: Married    Spouse name: Daryl   Number of children: Not on file   Years of education: Not on file   Highest education level: Not on file  Occupational History    Comment: retired  Tobacco Use   Smoking status: Former    Packs/day: 0.50    Years: 10.00    Pack years: 5.00    Types: Cigarettes    Quit date: 03/07/1995    Years since quitting: 26.4   Smokeless tobacco: Never  Vaping Use   Vaping Use: Never used  Substance and Sexual Activity   Alcohol use: Yes    Comment: rare   Drug use: Never   Sexual activity: Not on file  Other Topics Concern   Not on file  Social History Narrative   Lives with husband   Social Determinants of Health   Financial Resource Strain: Low Risk    Difficulty of Paying Living Expenses: Not hard at all  Food Insecurity: No Food Insecurity   Worried About Charity fundraiser in the Last Year: Never true   [place] in the Last Year: Never true  Transportation Needs: No Transportation Needs   Lack of Transportation (Medical): No   Lack of Transportation (Non-Medical): No  Physical Activity: Inactive   Days of Exercise per Week: 0 days   Minutes of Exercise per Session:  0 min  Stress: No Stress Concern Present   Feeling of Stress : Not at all  Social Connections: Moderately Isolated   Frequency of Communication with Friends and Family: More than three times a week   Frequency of Social Gatherings with Friends and Family: Once a week   Attends Religious Services: Never   Marine scientist or Organizations: No   Attends Archivist Meetings: Never   Marital Status: Married   Past Surgical History:  Procedure Laterality Date   BIOPSY  09/29/2019   Procedure: BIOPSY;  Surgeon: Ronnette Juniper, MD;  [facility];  Service: Gastroenterology;;   BREAST EXCISIONAL BIOPSY Right    BREAST EXCISIONAL BIOPSY Left 05/04/2018   BREAST LUMPECTOMY WITH RADIOACTIVE SEED LOCALIZATION  Left 05/04/2018   Procedure: LEFT BREAST LUMPECTOMY WITH RADIOACTIVE SEED LOCALIZATION AND LEFT BREAST NIPPLE BIOPSY;  Surgeon: Jovita Kussmaul, MD;  Location: [place];  Service: General;  Laterality: Left;   BREAST SURGERY  05/25/1973   lumpectomy-- benign   BUNIONECTOMY  05/25/1989   CATARACT EXTRACTION W/ INTRAOCULAR LENS  IMPLANT, BILATERAL  05/25/1998   CERVICAL FUSION  07/24/2011   C5 -- C7   CESAREAN SECTION  05/26/1983   COLONOSCOPY WITH PROPOFOL N/A 09/29/2019   Procedure: COLONOSCOPY WITH PROPOFOL;  Surgeon: Ronnette Juniper, MD;  [facility];  Service: Gastroenterology;  Laterality: N/A;   DISTAL INTERPHALANGEAL JOINT FUSION Right 03/14/2015   Procedure: RIGHT LONG FINGER DISTAL INTERPHALANGEAL JOINT ARTHRODESIS;  Surgeon: Iran Planas, MD;  Location: [place];  Service: Orthopedics;  Laterality: Right;   ESOPHAGOGASTRODUODENOSCOPY (EGD) WITH PROPOFOL N/A 09/29/2019   Procedure: ESOPHAGOGASTRODUODENOSCOPY (EGD) WITH PROPOFOL;  Surgeon: Ronnette Juniper, MD;  [facility];  Service: Gastroenterology;  Laterality: N/A;   EYE SURGERY  05/25/1993   rk (laser surgery), semi cornea transplant, detacted retina,  fluid removal   HYSTEROSCOPY WITH D & C N/A 12/09/2020   Procedure: DILATATION AND CURETTAGE /HYSTEROSCOPY;  Surgeon: Drema Dallas, DO;  Location: [place];  Service: Gynecology;  Laterality: N/A;   KNEE ARTHROSCOPY Left 03/03/2004   POLYPECTOMY  09/29/2019   Procedure: POLYPECTOMY;  Surgeon: Ronnette Juniper, MD;  [facility];  Service: Gastroenterology;;   POSTERIOR VITRECTOMY RIGHT EYE AND LASER   10/27/1999   SHOULDER SURGERY Right 05/26/1995   SPINAL FIXATION SURGERY W/ IMPLANT  2013 rod #1//  2014  rod 2   S1 -- T10  (rod #1)//   S1 -- T4 (rod #2)   THORACIC FUSION  03/13/2013   REMOVAL HARDWARE/  BONE GRAFT FUSION T10//  REVISION OF RODS   TOTAL KNEE ARTHROPLASTY Left 12/14/2005    UVULOPALATOPHARYNGOPLASTY  04/26/2006   w/  TONSILLECTOMY/  TURBINATE REDUCTIONS/  BILATERAL ANTERIOR ETHMOIDECTOMY   Past Surgical History:  Procedure Laterality Date   BIOPSY  09/29/2019   Procedure: BIOPSY;  Surgeon: Ronnette Juniper, MD;  [facility];  Service: Gastroenterology;;   BREAST EXCISIONAL BIOPSY Right    BREAST EXCISIONAL BIOPSY Left 05/04/2018   BREAST LUMPECTOMY WITH RADIOACTIVE SEED LOCALIZATION Left 05/04/2018   Procedure: LEFT BREAST LUMPECTOMY WITH RADIOACTIVE SEED LOCALIZATION AND LEFT BREAST NIPPLE BIOPSY;  Surgeon: Jovita Kussmaul, MD;  Location: [place];  Service: General;  Laterality: Left;   BREAST SURGERY  05/25/1973   lumpectomy-- benign   BUNIONECTOMY  05/25/1989   CATARACT EXTRACTION W/ INTRAOCULAR LENS  IMPLANT, BILATERAL  05/25/1998   CERVICAL FUSION  07/24/2011   C5 -- C7  CESAREAN SECTION  05/26/1983   COLONOSCOPY WITH PROPOFOL N/A 09/29/2019   Procedure: COLONOSCOPY WITH PROPOFOL;  Surgeon: Ronnette Juniper, MD;  [facility];  Service: Gastroenterology;  Laterality: N/A;   DISTAL INTERPHALANGEAL JOINT FUSION Right 03/14/2015   Procedure: RIGHT LONG FINGER DISTAL INTERPHALANGEAL JOINT ARTHRODESIS;  Surgeon: Iran Planas, MD;  Location: [place];  Service: Orthopedics;  Laterality: Right;   ESOPHAGOGASTRODUODENOSCOPY (EGD) WITH PROPOFOL N/A 09/29/2019   Procedure: ESOPHAGOGASTRODUODENOSCOPY (EGD) WITH PROPOFOL;  Surgeon: Ronnette Juniper, MD;  [facility];  Service: Gastroenterology;  Laterality: N/A;   EYE SURGERY  05/25/1993   rk (laser surgery), semi cornea transplant, detacted retina,  fluid removal   HYSTEROSCOPY WITH D & C N/A 12/09/2020   Procedure: DILATATION AND CURETTAGE /HYSTEROSCOPY;  Surgeon: Drema Dallas, DO;  Location: [place];  Service: Gynecology;  Laterality: N/A;   KNEE ARTHROSCOPY Left 03/03/2004   POLYPECTOMY  09/29/2019   Procedure: POLYPECTOMY;  Surgeon:  Ronnette Juniper, MD;  [facility];  Service: Gastroenterology;;   POSTERIOR VITRECTOMY RIGHT EYE AND LASER   10/27/1999   SHOULDER SURGERY Right 05/26/1995   SPINAL FIXATION SURGERY W/ IMPLANT  2013 rod #1//  2014  rod 2   S1 -- T10  (rod #1)//   S1 -- T4 (rod #2)   THORACIC FUSION  03/13/2013   REMOVAL HARDWARE/  BONE GRAFT FUSION T10//  REVISION OF RODS   TOTAL KNEE ARTHROPLASTY Left 12/14/2005   UVULOPALATOPHARYNGOPLASTY  04/26/2006   w/  TONSILLECTOMY/  TURBINATE REDUCTIONS/  BILATERAL ANTERIOR ETHMOIDECTOMY   Past Medical History:  Diagnosis Date   Ankylosing spondylitis ([place])    Anxiety and depression    Depression    Fibromyalgia    GERD (gastroesophageal reflux disease)    Hiatal hernia    per patient, dx by GI    History of basal cell carcinoma excision    FACE, 1992  &  1996   History of hiatal hernia    History of thrush    Hyperlipidemia    Hypertension    Insomnia    Left breast mass    OCD (obsessive compulsive disorder)    Osteopenia    PONV (postoperative nausea and vomiting)    Psoriatic arthritis ([facility])    PVC (premature ventricular contraction)    Rheumatoid arthritis ([facility])    BP (!) 134/91    Pulse 88    Ht '5\' 3"'$  (1.6 m)    Wt 199 lb (90.3 kg)    SpO2 96%    BMI 35.25 kg/m   Opioid Risk Score:   Fall Risk Score:  `1  Depression screen PHQ 2/9  Depression screen [facility] 2/9 06/05/2021 04/29/2021 03/11/2021 02/11/2021 11/05/2020 10/10/2020 09/24/2020  Decreased Interest 0 1 0 1 1 0 0  Down, Depressed, Hopeless 0 1 0 1 1 0 0  PHQ - 2 Score 0 2 0 2 2 0 0  Altered sleeping - - - - 1 0 0  Tired, decreased energy - - - - 0 0 0  Change in appetite - - - - 0 0 0  Feeling bad or failure about yourself  - - - - 0 0 0  Trouble concentrating - - - - 0 0 0  Moving slowly or fidgety/restless - - - - 0 0 0  Suicidal thoughts - - - - 0 0 0  PHQ-9 Score - - - - 3 0 0  Difficult doing work/chores - - - - Not difficult  at all Not difficult at all Not difficult at all   Some recent data might be hidden     Review of Systems  Musculoskeletal:        Pain run from one shoulder blade to the other.  All other systems reviewed and are negative.     Objective:   Physical Exam Vitals and nursing note reviewed.  Constitutional:      Appearance: She is obese.  HENT:     Head: Normocephalic and atraumatic.  Eyes:     Extraocular Movements: Extraocular movements intact.     Conjunctiva/sclera: Conjunctivae normal.     Pupils: Pupils are equal, round, and reactive to light.  Neck:     Comments: Tenderness over the left paraspinal musculature at C7, minimal tenderness over the left trapezius as well as left lateral cervical area around C5. No tenderness on the right side  Musculoskeletal:     Cervical back: Normal range of motion and neck supple. Tenderness present.  Neurological:     Mental Status: She is alert.    Upper extremity strength is normal      Assessment & Plan:   1.  Cervical dystonia very sensitive to effects of botulinum toxin may consider lowering dose to 25 units into the left trapezius and using 25 to 50 units into the left splenius capitis. At this point the patient would like to hold off on this We discussed other treatment options including acupuncture or massage.  We will check on her acupuncture benefits and set her up for monthly visits x4 if covered at this office otherwise she can look outside of her health plan.  I have given her resources for both acupuncture and massage

## 2021-08-04 ENCOUNTER — Other Ambulatory Visit: Payer: Self-pay

## 2021-08-04 ENCOUNTER — Ambulatory Visit (INDEPENDENT_AMBULATORY_CARE_PROVIDER_SITE_OTHER): Payer: Medicare Other | Admitting: Family Medicine

## 2021-08-04 ENCOUNTER — Encounter: Payer: Self-pay | Admitting: Family Medicine

## 2021-08-04 VITALS — BP 118/82 | HR 77 | Temp 97.2°F | Resp 18 | Ht 63.0 in | Wt 199.0 lb

## 2021-08-04 DIAGNOSIS — J40 Bronchitis, not specified as acute or chronic: Secondary | ICD-10-CM | POA: Diagnosis not present

## 2021-08-04 DIAGNOSIS — J0140 Acute pansinusitis, unspecified: Secondary | ICD-10-CM

## 2021-08-04 MED ORDER — BENZONATATE 200 MG PO CAPS: 200.0000 mg | ORAL_CAPSULE | Freq: Three times a day (TID) | ORAL | 0 refills | Status: DC | PRN

## 2021-08-04 MED ORDER — PREDNISONE 20 MG PO TABS: ORAL_TABLET | ORAL | 0 refills | Status: DC

## 2021-08-04 MED ORDER — LEVOFLOXACIN 500 MG PO TABS: 500.0000 mg | ORAL_TABLET | Freq: Every day | ORAL | 0 refills | Status: AC

## 2021-08-04 NOTE — Progress Notes (Signed)
Subjective:    Patient ID: Janet Peterson, female    DOB: Oct 19, 1950, 71 y.o.   MRN: [id number] 07/24/21 I recently saw the patient's husband for bronchitis.  Patient states for the last week she has had similar symptoms.  She reports no fever but she reports cough productive of yellow mucus.  She reports wheezing at night when she lies down.  She reports head congestion and rhinorrhea.  She denies any chest pain or shortness of breath.  She denies any sinus pain.  She denies any sore throat.  At that time, my plan was:  Patient's blood pressure is elevated.  She cannot tolerate amlodipine so I will add doxazosin 2 mg a day to her other antihypertensives.  Recheck blood pressure in 2 weeks.  I believe the patient has bronchitis.  I explained that this is most likely a virus and will take tincture of time.  I recommend Coricidin HBP with Mucinex DM.  She can use Hycodan for her cough.  If symptoms worsen I did give patient prescription for Z-Pak strict instructions not to fill unless symptoms worsen.  08/04/21 Patient is coughing incessantly.  She has been taking Tessalon Perles without any relief.  She denies any fever but she does report some mild shortness of breath.  She is wheezing today on examination.  She has rhonchorous breath sounds throughout.  She has a croup-like cough.  However over the last week she has developed pain and pressure in her frontal and maxillary sinuses with copious green-brown nasal discharge.  She reports a headache.  She denies any chest pain.  She denies any pleurisy. Past Medical History:  Diagnosis Date   Ankylosing spondylitis ([place])    Anxiety and depression    Depression    Fibromyalgia    GERD (gastroesophageal reflux disease)    Hiatal hernia    per patient, dx by GI    History of basal cell carcinoma excision    FACE, 1992  &  1996   History of hiatal hernia    History of thrush    Hyperlipidemia    Hypertension    Insomnia    Left breast mass    OCD  (obsessive compulsive disorder)    Osteopenia    PONV (postoperative nausea and vomiting)    Psoriatic arthritis ([facility])    PVC (premature ventricular contraction)    Rheumatoid arthritis ([place])    Past Surgical History:  Procedure Laterality Date   BIOPSY  09/29/2019   Procedure: BIOPSY;  Surgeon: Ronnette Juniper, MD;  Location: [facility];  Service: Gastroenterology;;   BREAST EXCISIONAL BIOPSY Right    BREAST EXCISIONAL BIOPSY Left 05/04/2018   BREAST LUMPECTOMY WITH RADIOACTIVE SEED LOCALIZATION Left 05/04/2018   Procedure: LEFT BREAST LUMPECTOMY WITH RADIOACTIVE SEED LOCALIZATION AND LEFT BREAST NIPPLE BIOPSY;  Surgeon: Jovita Kussmaul, MD;  Location: [facility];  Service: General;  Laterality: Left;   BREAST SURGERY  05/25/1973   lumpectomy-- benign   BUNIONECTOMY  05/25/1989   CATARACT EXTRACTION W/ INTRAOCULAR LENS  IMPLANT, BILATERAL  05/25/1998   CERVICAL FUSION  07/24/2011   C5 -- C7   CESAREAN SECTION  05/26/1983   COLONOSCOPY WITH PROPOFOL N/A 09/29/2019   Procedure: COLONOSCOPY WITH PROPOFOL;  Surgeon: Ronnette Juniper, MD;  Location: [facility];  Service: Gastroenterology;  Laterality: N/A;   DISTAL INTERPHALANGEAL JOINT FUSION Right 03/14/2015   Procedure: RIGHT LONG FINGER DISTAL INTERPHALANGEAL JOINT ARTHRODESIS;  Surgeon: Iran Planas, MD;  Location: [facility];  Service: Orthopedics;  Laterality: Right;   ESOPHAGOGASTRODUODENOSCOPY (EGD) WITH PROPOFOL N/A 09/29/2019   Procedure: ESOPHAGOGASTRODUODENOSCOPY (EGD) WITH PROPOFOL;  Surgeon: Ronnette Juniper, MD;  Location: [facility];  Service: Gastroenterology;  Laterality: N/A;   EYE SURGERY  05/25/1993   rk (laser surgery), semi cornea transplant, detacted retina,  fluid removal   HYSTEROSCOPY WITH D & C N/A 12/09/2020   Procedure: DILATATION AND CURETTAGE /HYSTEROSCOPY;  Surgeon: Drema Dallas, DO;  Location: [facility];  Service: Gynecology;  Laterality: N/A;   KNEE ARTHROSCOPY  Left 03/03/2004   POLYPECTOMY  09/29/2019   Procedure: POLYPECTOMY;  Surgeon: Ronnette Juniper, MD;  Location: [facility];  Service: Gastroenterology;;   POSTERIOR VITRECTOMY RIGHT EYE AND LASER   10/27/1999   SHOULDER SURGERY Right 05/26/1995   SPINAL FIXATION SURGERY W/ IMPLANT  2013 rod #1//  2014  rod 2   S1 -- T10  (rod #1)//   S1 -- T4 (rod #2)   THORACIC FUSION  03/13/2013   REMOVAL HARDWARE/  BONE GRAFT FUSION T10//  REVISION OF RODS   TOTAL KNEE ARTHROPLASTY Left 12/14/2005   UVULOPALATOPHARYNGOPLASTY  04/26/2006   w/  TONSILLECTOMY/  TURBINATE REDUCTIONS/  BILATERAL ANTERIOR ETHMOIDECTOMY   Current Outpatient Medications on File Prior to Visit  Medication Sig Dispense Refill   azithromycin (ZITHROMAX) 250 MG tablet 2 tabs poqday1, 1 tab poqday 2-5 6 tablet 0   buPROPion (WELLBUTRIN XL) 300 MG 24 hr tablet Take 300 mg by mouth every morning.  1   clonazePAM (KLONOPIN) 1 MG tablet TAKE 1 TABLET BY MOUTH EVERY DAY AS NEEDED FOR ANXIETY (Patient taking differently: Take 1 mg by mouth daily as needed for anxiety.) 30 tablet 1   doxazosin (CARDURA) 2 MG tablet Take 1 tablet (2 mg total) by mouth daily. 30 tablet 3   doxepin (SINEQUAN) 50 MG capsule Take 50 mg by mouth.     ezetimibe (ZETIA) 10 MG tablet TAKE 1 TABLET BY MOUTH EVERY DAY 90 tablet 3   FLUoxetine (PROZAC) 40 MG capsule Take 40 mg by mouth daily.     fluticasone (FLONASE) 50 MCG/ACT nasal spray Place 2 sprays into both nostrils daily as needed for allergies. 16 g 11   hydrochlorothiazide (HYDRODIURIL) 25 MG tablet Take 1 tablet (25 mg total) by mouth daily. 90 tablet 3   HYDROcodone bit-homatropine (HYCODAN) 5-1.5 MG/5ML syrup Take 5 mLs by mouth every 8 (eight) hours as needed for cough. 120 mL 0   INGREZZA 80 MG capsule Take one capsule by mouth daily 30 capsule 1   ipratropium (ATROVENT) 0.06 % nasal spray Place 2 sprays into both nostrils 4 (four) times daily. 15 mL 12   leflunomide (ARAVA) 20 MG tablet TAKE 1 TABLET BY  MOUTH EVERY DAY 90 tablet 0   metoprolol succinate (TOPROL-XL) 50 MG 24 hr tablet TAKE 1 TABLET BY MOUTH ONCE DAILY FOLLOWING A MEAL 90 tablet 3   mometasone (ELOCON) 0.1 % cream Apply topically daily. Apply sparingly to affected area daily as needed. 15 g 0   neomycin-polymyxin-hydrocortisone (CORTISPORIN) OTIC solution Place 3 drops into the left ear 4 (four) times daily. 10 mL 0   omeprazole (PRILOSEC) 40 MG capsule TAKE 1 CAPSULE BY MOUTH EVERY MORNING AND AT BEDTIME 180 capsule 1   ondansetron (ZOFRAN) 4 MG tablet Take 4 mg by mouth 4 (four) times daily as needed for nausea.     oxyCODONE-acetaminophen (PERCOCET) 5-325 MG tablet Take 1 tablet by mouth at bedtime. 30 tablet 0  QUEtiapine (SEROQUEL) 25 MG tablet Take 25-50 mg by mouth at bedtime.     tiZANidine (ZANAFLEX) 4 MG capsule Take 1 capsule (4 mg total) by mouth 3 (three) times daily. 30 capsule 1   tobramycin-dexamethasone (TOBRADEX) ophthalmic solution Place 1 drop into the left eye every 4 (four) hours while awake. 5 mL 0   triamcinolone cream (KENALOG) 0.1 % APPLY TO AFFECTED AREA TWICE A DAY 30 g 0   valsartan (DIOVAN) 320 MG tablet Take 1 tablet (320 mg total) by mouth daily. 90 tablet 3   No current facility-administered medications on file prior to visit.   Allergies  Allergen Reactions   Latex Rash    And Mouth sores Other reaction(s): rash Other reaction(s): rash Other reaction(s): rash   Amlodipine Besy-Benazepril Hcl Other (See Comments)    COUGH   Amoxicillin-Pot Clavulanate Diarrhea and Nausea And Vomiting    Other reaction(s): sick on stomach Other reaction(s): sick on stomach   Bextra [Valdecoxib] Diarrhea and Nausea And Vomiting    REFLUX   Bupropion     Other reaction(s): rash, hives, nausea, high BP , sores, Other reaction(s): rash, hives, nausea, high BP , sores, Other reaction(s): rash, hives, nausea, high BP , sores,   Chlorhexidine     rash Other reaction(s): rash all over body Other  reaction(s): rash all over body   Lipitor [Atorvastatin Calcium] Other (See Comments)    MYALGIAS   Sulfa Antibiotics Nausea And Vomiting    CRAMPING Other reaction(s): stomach hurts Other reaction(s): stomach hurts   Zocor [Simvastatin] Other (See Comments)    MYALGIA   Social History   Socioeconomic History   Marital status: Married    Spouse name: Daryl   Number of children: Not on file   Years of education: Not on file   Highest education level: Not on file  Occupational History    Comment: retired  Tobacco Use   Smoking status: Former    Packs/day: 0.50    Years: 10.00    Pack years: 5.00    Types: Cigarettes    Quit date: 03/07/1995    Years since quitting: 26.4   Smokeless tobacco: Never  Vaping Use   Vaping Use: Never used  Substance and Sexual Activity   Alcohol use: Yes    Comment: rare   Drug use: Never   Sexual activity: Not on file  Other Topics Concern   Not on file  Social History Narrative   Lives with husband   Social Determinants of Health   Financial Resource Strain: Low Risk    Difficulty of Paying Living Expenses: Not hard at all  Food Insecurity: No Food Insecurity   Worried About Charity fundraiser in the Last Year: Never true   Ran Out of Food in the Last Year: Never true  Transportation Needs: No Transportation Needs   Lack of Transportation (Medical): No   Lack of Transportation (Non-Medical): No  Physical Activity: Inactive   Days of Exercise per Week: 0 days   Minutes of Exercise per Session: 0 min  Stress: No Stress Concern Present   Feeling of Stress : Not at all  Social Connections: Moderately Isolated   Frequency of Communication with Friends and Family: More than three times a week   Frequency of Social Gatherings with Friends and Family: Once a week   Attends Religious Services: Never   Marine scientist or Organizations: No   Attends Archivist Meetings: Never   Marital Status:  Married  Human resources officer  Violence: Not At Risk   Fear of Current or Ex-Partner: No   Emotionally Abused: No   Physically Abused: No   Sexually Abused: No    Review of Systems  Gastrointestinal:  Positive for abdominal pain.  All other systems reviewed and are negative.     Objective:   Physical Exam Vitals reviewed.  Constitutional:      General: She is not in acute distress.    Appearance: Normal appearance. She is normal weight. She is not ill-appearing or toxic-appearing.  HENT:     Right Ear: Tympanic membrane normal. No swelling or tenderness.     Left Ear: Tympanic membrane and external ear normal. No decreased hearing noted. Swelling and tenderness present.  No middle ear effusion. No foreign body. No mastoid tenderness.     Nose: Congestion and rhinorrhea present.     Right Sinus: Maxillary sinus tenderness and frontal sinus tenderness present.     Left Sinus: Maxillary sinus tenderness and frontal sinus tenderness present.  Cardiovascular:     Rate and Rhythm: Normal rate and regular rhythm.     Heart sounds: Normal heart sounds. No murmur heard.   No friction rub. No gallop.  Pulmonary:     Effort: Pulmonary effort is normal. No respiratory distress.     Breath sounds: No decreased air movement. Wheezing and rhonchi present. No rales.  Abdominal:     General: Abdomen is flat. Bowel sounds are normal. There is no distension.     Palpations: Abdomen is soft.     Tenderness: There is no abdominal tenderness.  Musculoskeletal:     Lumbar back: Tenderness present.     Right lower leg: No edema.     Left lower leg: No edema.  Neurological:     Mental Status: She is alert.          Assessment & Plan:  Bronchitis  Acute non-recurrent pansinusitis Switch patient to Levaquin 500 mg daily for 7 days to cover bronchitis and sinusitis given her penicillin allergy.  Add prednisone taper pack due to the wheezing and the congestion and sinus pain.  Use Tessalon Perles 200 mg every 8 hours as  needed for cough.

## 2021-08-05 ENCOUNTER — Encounter: Payer: Self-pay | Admitting: Rheumatology

## 2021-08-05 ENCOUNTER — Ambulatory Visit (INDEPENDENT_AMBULATORY_CARE_PROVIDER_SITE_OTHER): Payer: Medicare Other | Admitting: Rheumatology

## 2021-08-05 VITALS — BP 149/85 | HR 78 | Ht 63.0 in | Wt 203.8 lb

## 2021-08-05 DIAGNOSIS — F5101 Primary insomnia: Secondary | ICD-10-CM

## 2021-08-05 DIAGNOSIS — M19041 Primary osteoarthritis, right hand: Secondary | ICD-10-CM

## 2021-08-05 DIAGNOSIS — R5383 Other fatigue: Secondary | ICD-10-CM

## 2021-08-05 DIAGNOSIS — L409 Psoriasis, unspecified: Secondary | ICD-10-CM

## 2021-08-05 DIAGNOSIS — M8589 Other specified disorders of bone density and structure, multiple sites: Secondary | ICD-10-CM

## 2021-08-05 DIAGNOSIS — L4050 Arthropathic psoriasis, unspecified: Secondary | ICD-10-CM

## 2021-08-05 DIAGNOSIS — M19042 Primary osteoarthritis, left hand: Secondary | ICD-10-CM

## 2021-08-05 DIAGNOSIS — Z79899 Other long term (current) drug therapy: Secondary | ICD-10-CM

## 2021-08-05 DIAGNOSIS — Z8639 Personal history of other endocrine, nutritional and metabolic disease: Secondary | ICD-10-CM

## 2021-08-05 DIAGNOSIS — M1711 Unilateral primary osteoarthritis, right knee: Secondary | ICD-10-CM

## 2021-08-05 DIAGNOSIS — Z96652 Presence of left artificial knee joint: Secondary | ICD-10-CM

## 2021-08-05 DIAGNOSIS — Z8659 Personal history of other mental and behavioral disorders: Secondary | ICD-10-CM

## 2021-08-05 DIAGNOSIS — M5134 Other intervertebral disc degeneration, thoracic region: Secondary | ICD-10-CM

## 2021-08-05 DIAGNOSIS — Z8669 Personal history of other diseases of the nervous system and sense organs: Secondary | ICD-10-CM

## 2021-08-05 DIAGNOSIS — M5030 Other cervical disc degeneration, unspecified cervical region: Secondary | ICD-10-CM

## 2021-08-05 DIAGNOSIS — Z8679 Personal history of other diseases of the circulatory system: Secondary | ICD-10-CM

## 2021-08-05 DIAGNOSIS — M47816 Spondylosis without myelopathy or radiculopathy, lumbar region: Secondary | ICD-10-CM

## 2021-08-05 DIAGNOSIS — G2581 Restless legs syndrome: Secondary | ICD-10-CM

## 2021-08-05 DIAGNOSIS — Z8719 Personal history of other diseases of the digestive system: Secondary | ICD-10-CM

## 2021-08-05 DIAGNOSIS — M797 Fibromyalgia: Secondary | ICD-10-CM

## 2021-08-05 NOTE — Patient Instructions (Addendum)
Standing Labs ?We placed an order today for your standing lab work.  ? ?Please have your standing labs drawn in April and every 36month ? ?If possible, please have your labs drawn 2 weeks prior to your appointment so that the provider can discuss your results at your appointment. ? ?Please note that you may see your imaging and lab results in [facility] we have reviewed them. ?We may be awaiting multiple results to interpret others before contacting you. ?Please allow our office up to 72 hours to thoroughly review all of the results before contacting the office for clarification of your results. ? ?We have open lab daily: ?Monday through Thursday from 1:30-4:30 PM and Friday from 1:30-4:00 PM ?at the office of Dr[facility].   ?Please be advised, all patients with office appointments requiring lab work will take precedent over walk-in lab work.  ?If possible, please come for your lab work on Monday and Friday afternoons, as you may experience shorter wait times. ?The office is located at [address] [place] [id number]?No appointment is necessary.   ?Labs are drawn by [facility]. Please bring your co-pay at the time of your lab draw.  You may receive a bill from [facility]. ? ?Please note if you are on Hydroxychloroquine and and an order has been placed for a Hydroxychloroquine level, you will need to have it drawn 4 hours or more after your last dose. ? ?If you wish to have your labs drawn at another location, please call the office 24 hours in advance to send orders. ? ?If you have any questions regarding directions or hours of operation,  ?please call [phone]   ?As a reminder, please drink plenty of water prior to coming for your lab work. Thanks!  ? ?Vaccines ?You are taking a medication(s) that can suppress your immune system.  The following immunizations are recommended: ?Flu annually ?Covid-19  ?Td/Tdap (tetanus, diphtheria, pertussis)  every 10 years ?Pneumonia (Prevnar 15 then Pneumovax 23 at least 1 year apart.  Alternatively, can take Prevnar 20 without needing additional dose) ?Shingrix: 2 doses from 4 weeks to 6 months apart ? ?Please check with your PCP to make sure you are up to date.  ? ?If you have signs or symptoms of an infection or start antibiotics: ?First, call your PCP for workup of your infection. ?Hold your medication through the infection, until you complete your antibiotics, and until symptoms resolve if you take the following: ?Injectable medication (Actemra, Benlysta, Cimzia, Cosentyx, Enbrel, Humira, Kevzara, Orencia, Remicade, Simponi, SStrayhorn TWood TVictor ?Methotrexate ?Leflunomide (Jolee Ewing ?Mycophenolate (Cellcept) ?XRoma Kayser or Rinvoq  ?Back Exercises ?The following exercises strengthen the muscles that help to support the trunk (torso) and back. They also help to keep the lower back flexible. Doing these exercises can help to prevent or lessen existing low back pain. ?If you have back pain or discomfort, try doing these exercises 2-3 times each day or as told by your health care provider. ?As your pain improves, do them once each day, but increase the number of times that you repeat the steps for each exercise (do more repetitions). ?To prevent the recurrence of back pain, continue to do these exercises once each day or as told by your health care provider. ?Do exercises exactly as told by your health care provider and adjust them as directed. It is normal to feel mild stretching, pulling, tightness, or discomfort as you do these exercises, but you  should stop right away if you feel sudden pain or your pain gets worse. ?Exercises ?Single knee to chest ?Repeat these steps 3-5 times for each leg: ?Lie on your back on a firm bed or the floor with your legs extended. ?Bring one knee to your chest. Your other leg should stay extended and in contact with the floor. ?Hold your knee in place by grabbing your knee or  thigh with both hands and hold. ?Pull on your knee until you feel a gentle stretch in your lower back or buttocks. ?Hold the stretch for 10-30 seconds. ?Slowly release and straighten your leg. ? ?Pelvic tilt ?Repeat these steps 5-10 times: ?Lie on your back on a firm bed or the floor with your legs extended. ?Bend your knees so they are pointing toward the ceiling and your feet are flat on the floor. ?Tighten your lower abdominal muscles to press your lower back against the floor. This motion will tilt your pelvis so your tailbone points up toward the ceiling instead of pointing to your feet or the floor. ?With gentle tension and even breathing, hold this position for 5-10 seconds. ? ?Cat-cow ?Repeat these steps until your lower back becomes more flexible: ?Get into a hands-and-knees position on a firm bed or the floor. Keep your hands under your shoulders, and keep your knees under your hips. You may place padding under your knees for comfort. ?Let your head hang down toward your chest. Contract your abdominal muscles and point your tailbone toward the floor so your lower back becomes rounded like the back of a cat. ?Hold this position for 5 seconds. ?Slowly lift your head, let your abdominal muscles relax, and point your tailbone up toward the ceiling so your back forms a sagging arch like the back of a cow. ?Hold this position for 5 seconds. ? ?Press-ups ?Repeat these steps 5-10 times: ?Lie on your abdomen (face-down) on a firm bed or the floor. ?Place your palms near your head, about shoulder-width apart. ?Keeping your back as relaxed as possible and keeping your hips on the floor, slowly straighten your arms to raise the top half of your body and lift your shoulders. Do not use your back muscles to raise your upper torso. You may adjust the placement of your hands to make yourself more comfortable. ?Hold this position for 5 seconds while you keep your back relaxed. ?Slowly return to lying flat on the  floor. ? ?Bridges ?Repeat these steps 10 times: ?Lie on your back on a firm bed or the floor. ?Bend your knees so they are pointing toward the ceiling and your feet are flat on the floor. Your arms should be flat at your sides, next to your body. ?Tighten your buttocks muscles and lift your buttocks off the floor until your waist is at almost the same height as your knees. You should feel the muscles working in your buttocks and the back of your thighs. If you do not feel these muscles, slide your feet 1-2 inches (2.5-5 cm) farther away from your buttocks. ?Hold this position for 3-5 seconds. ?Slowly lower your hips to the starting position, and allow your buttocks muscles to relax completely. ?If this exercise is too easy, try doing it with your arms crossed over your chest. ?Abdominal crunches ?Repeat these steps 5-10 times: ?Lie on your back on a firm bed or the floor with your legs extended. ?Bend your knees so they are pointing toward the ceiling and your feet are flat on the floor. ?[employer]  your arms over your chest. ?Tip your chin slightly toward your chest without bending your neck. ?Tighten your abdominal muscles and slowly raise your torso high enough to lift your shoulder blades a tiny bit off the floor. Avoid raising your torso higher than that because it can put too much stress on your lower back and does not help to strengthen your abdominal muscles. ?Slowly return to your starting position. ? ?Back lifts ?Repeat these steps 5-10 times: ?Lie on your abdomen (face-down) with your arms at your sides, and rest your forehead on the floor. ?Tighten the muscles in your legs and your buttocks. ?Slowly lift your chest off the floor while you keep your hips pressed to the floor. Keep the back of your head in line with the curve in your back. Your eyes should be looking at the floor. ?Hold this position for 3-5 seconds. ?Slowly return to your starting position. ? ?Contact a health care provider if: ?Your back pain  or discomfort gets much worse when you do an exercise. ?Your worsening back pain or discomfort does not lessen within 2 hours after you exercise. ?If you have any of these problems, stop doing these exercises

## 2021-08-07 ENCOUNTER — Other Ambulatory Visit: Payer: Self-pay

## 2021-08-07 ENCOUNTER — Ambulatory Visit (INDEPENDENT_AMBULATORY_CARE_PROVIDER_SITE_OTHER): Payer: Medicare Other

## 2021-08-07 VITALS — Ht 63.0 in | Wt 203.0 lb

## 2021-08-07 DIAGNOSIS — Z0000 Encounter for general adult medical examination without abnormal findings: Secondary | ICD-10-CM

## 2021-08-07 NOTE — Patient Instructions (Signed)
Janet Peterson , ?Thank you for taking time to come for your Medicare Wellness Visit. I appreciate your ongoing commitment to your health goals. Please review the following plan we discussed and let me know if I can assist you in the future.  ? ?Screening recommendations/referrals: ?Colonoscopy: Done 09/29/2019 Repeat in 5 years ? ?Mammogram: done 04/02/2021 Repeat annually ? ?Bone Density: Done 04/02/2021 Repeat annually ? ?Recommended yearly ophthalmology/optometry visit for glaucoma screening and checkup ?Recommended yearly dental visit for hygiene and checkup ? ?Vaccinations: ?Influenza vaccine: Done 04/08/2021 Repeat annually ? ?Pneumococcal vaccine: Done 09/10/2016 and 11/09/2011 ?Tdap vaccine: Due. Repeat in 10 years ? ?Shingles vaccine: Discussed.   ?Covid-19:Done 08/07/2019, 08/28/2019 and 03/08/2020  ? ?Advanced directives: Please bring a copy of your health care power of attorney and living will to the office to be added to your chart at your convenience. ? ? ?Conditions/risks identified: Aim for 30 minutes of exercise or brisk walking, 6-8 glasses of water, and 5 servings of fruits and vegetables each day. ? ? ?Next appointment: Follow up in one year for your annual wellness visit 2024. ? ? ?Preventive Care 15 Years and Older, Female ?Preventive care refers to lifestyle choices and visits with your health care provider that can promote health and wellness. ?What does preventive care include? ?A yearly physical exam. This is also called an annual well check. ?Dental exams once or twice a year. ?Routine eye exams. Ask your health care provider how often you should have your eyes checked. ?Personal lifestyle choices, including: ?Daily care of your teeth and gums. ?Regular physical activity. ?Eating a healthy diet. ?Avoiding tobacco and drug use. ?Limiting alcohol use. ?Practicing safe sex. ?Taking low-dose aspirin every day. ?Taking vitamin and mineral supplements as recommended by your health care provider. ?What happens  during an annual well check? ?The services and screenings done by your health care provider during your annual well check will depend on your age, overall health, lifestyle risk factors, and family history of disease. ?Counseling  ?Your health care provider may ask you questions about your: ?Alcohol use. ?Tobacco use. ?Drug use. ?Emotional well-being. ?Home and relationship well-being. ?Sexual activity. ?Eating habits. ?History of falls. ?Memory and ability to understand (cognition). ?Work and work Statistician. ?Reproductive health. ?Screening  ?You may have the following tests or measurements: ?Height, weight, and BMI. ?Blood pressure. ?Lipid and cholesterol levels. These may be checked every 5 years, or more frequently if you are over 49 years old. ?Skin check. ?Lung cancer screening. You may have this screening every year starting at age 4 if you have a 30-pack-year history of smoking and currently smoke or have quit within the past 15 years. ?Fecal occult blood test (FOBT) of the stool. You may have this test every year starting at age 79. ?Flexible sigmoidoscopy or colonoscopy. You may have a sigmoidoscopy every 5 years or a colonoscopy every 10 years starting at age 90. ?Hepatitis C blood test. ?Hepatitis B blood test. ?Sexually transmitted disease (STD) testing. ?Diabetes screening. This is done by checking your blood sugar (glucose) after you have not eaten for a while (fasting). You may have this done every 1-3 years. ?Bone density scan. This is done to screen for osteoporosis. You may have this done starting at age 24. ?Mammogram. This may be done every 1-2 years. Talk to your health care provider about how often you should have regular mammograms. ?Talk with your health care provider about your test results, treatment options, and if necessary, the need for more tests. ?Vaccines  ?  Your health care provider may recommend certain vaccines, such as: ?Influenza vaccine. This is recommended every  year. ?Tetanus, diphtheria, and acellular pertussis (Tdap, Td) vaccine. You may need a Td booster every 10 years. ?Zoster vaccine. You may need this after age 48. ?Pneumococcal 13-valent conjugate (PCV13) vaccine. One dose is recommended after age 63. ?Pneumococcal polysaccharide (PPSV23) vaccine. One dose is recommended after age 64. ?Talk to your health care provider about which screenings and vaccines you need and how often you need them. ?This information is not intended to replace advice given to you by your health care provider. Make sure you discuss any questions you have with your health care provider. ?Document Released: 06/07/2015 Document Revised: 01/29/2016 Document Reviewed: 03/12/2015 ?Elsevier Interactive Patient Education ? 2017 [place]. ? ?Fall Prevention in the Home ?Falls can cause injuries. They can happen to people of all ages. There are many things you can do to make your home safe and to help prevent falls. ?What can I do on the outside of my home? ?Regularly fix the edges of walkways and driveways and fix any cracks. ?Remove anything that might make you trip as you walk through a door, such as a raised step or threshold. ?Trim any bushes or trees on the path to your home. ?Use bright outdoor lighting. ?Clear any walking paths of anything that might make someone trip, such as rocks or tools. ?Regularly check to see if handrails are loose or broken. Make sure that both sides of any steps have handrails. ?Any raised decks and porches should have guardrails on the edges. ?Have any leaves, snow, or ice cleared regularly. ?Use sand or salt on walking paths during winter. ?Clean up any spills in your garage right away. This includes oil or grease spills. ?What can I do in the bathroom? ?Use night lights. ?Install grab bars by the toilet and in the tub and shower. Do not use towel bars as grab bars. ?Use non-skid mats or decals in the tub or shower. ?If you need to sit down in the shower, use a  plastic, non-slip stool. ?Keep the floor dry. Clean up any water that spills on the floor as soon as it happens. ?Remove soap buildup in the tub or shower regularly. ?Attach bath mats securely with double-sided non-slip rug tape. ?Do not have throw rugs and other things on the floor that can make you trip. ?What can I do in the bedroom? ?Use night lights. ?Make sure that you have a light by your bed that is easy to reach. ?Do not use any sheets or blankets that are too big for your bed. They should not hang down onto the floor. ?Have a firm chair that has side arms. You can use this for support while you get dressed. ?Do not have throw rugs and other things on the floor that can make you trip. ?What can I do in the kitchen? ?Clean up any spills right away. ?Avoid walking on wet floors. ?Keep items that you use a lot in easy-to-reach places. ?If you need to reach something above you, use a strong step stool that has a grab bar. ?Keep electrical cords out of the way. ?Do not use floor polish or wax that makes floors slippery. If you must use wax, use non-skid floor wax. ?Do not have throw rugs and other things on the floor that can make you trip. ?What can I do with my stairs? ?Do not leave any items on the stairs. ?Make sure that there are  handrails on both sides of the stairs and use them. Fix handrails that are broken or loose. Make sure that handrails are as long as the stairways. ?Check any carpeting to make sure that it is firmly attached to the stairs. Fix any carpet that is loose or worn. ?Avoid having throw rugs at the top or bottom of the stairs. If you do have throw rugs, attach them to the floor with carpet tape. ?Make sure that you have a light switch at the top of the stairs and the bottom of the stairs. If you do not have them, ask someone to add them for you. ?What else can I do to help prevent falls? ?Wear shoes that: ?Do not have high heels. ?Have rubber bottoms. ?Are comfortable and fit you  well. ?Are closed at the toe. Do not wear sandals. ?If you use a stepladder: ?Make sure that it is fully opened. Do not climb a closed stepladder. ?Make sure that both sides of the stepladder are locked into place. ?Ask

## 2021-08-07 NOTE — Progress Notes (Signed)
? ?Subjective:  ? Janet Peterson is a 71 y.o. female who presents for an Initial Medicare Annual Wellness Visit. ?Virtual Visit via Telephone Note ? ?I connected with  Janet Peterson on 08/07/21 at  2:00 PM EDT by telephone and verified that I am speaking with the correct person using two identifiers. ? ?Location: ?Patient: HOME ?Provider:[facility] ?Persons participating in the virtual visit: patient/Nurse Health Advisor ?  ?I discussed the limitations, risks, security and privacy concerns of performing an evaluation and management service by telephone and the availability of in person appointments. The patient expressed understanding and agreed to proceed. ? ?Interactive audio and video telecommunications were attempted between this nurse and patient, however failed, due to patient having technical difficulties OR patient did not have access to video capability.  We continued and completed visit with audio only. ? ?Some vital signs may be absent or patient reported.  ? ?Janet Driver, LPN ? ?Review of Systems    ? ?Cardiac Risk Factors include: advanced age (>66mn, >>21women);dyslipidemia;hypertension;sedentary lifestyle;obesity (BMI >30kg/m2) ? ?   ?Objective:  ?  ?Today's Vitals  ? 08/07/21 1407 08/07/21 1409  ?Weight: 203 lb (92.1 kg)   ?Peterson: '5\' 3"'$  (1.6 m)   ?PainSc:  8   ? ?Body mass index is 35.96 kg/m?. ? ?Advanced Directives 08/07/2021 07/31/2021 12/09/2020 12/02/2020 11/19/2020 10/10/2020 04/25/2020  ?Does Patient Have a Medical Advance Directive? Yes Yes Yes Yes Yes Yes No  ?Type of Advance Directive Healthcare Power of ABroadwellLiving will -  ?Does patient want to make changes to medical advance directive? - - No - Patient declined No - Patient declined - No - Patient declined -  ?Copy of [place] Chart? No - copy requested - - No - copy requested - No - copy requested -  ?Would patient like  information on creating a medical advance directive? - - - - - - No - Patient declined  ? ? ?Current Medications (verified) ?Outpatient Encounter Medications as of 08/07/2021  ?Medication Sig  ? buPROPion (WELLBUTRIN XL) 300 MG 24 hr tablet Take 300 mg by mouth every morning.  ? clonazePAM (KLONOPIN) 1 MG tablet TAKE 1 TABLET BY MOUTH EVERY DAY AS NEEDED FOR ANXIETY (Patient taking differently: Take 1 mg by mouth daily as needed for anxiety.)  ? doxazosin (CARDURA) 2 MG tablet Take 1 tablet (2 mg total) by mouth daily.  ? doxepin (SINEQUAN) 50 MG capsule Take 50 mg by mouth.  ? ezetimibe (ZETIA) 10 MG tablet TAKE 1 TABLET BY MOUTH EVERY DAY  ? FLUoxetine (PROZAC) 40 MG capsule Take 40 mg by mouth daily.  ? fluticasone (FLONASE) 50 MCG/ACT nasal spray Place 2 sprays into both nostrils daily as needed for allergies.  ? hydrochlorothiazide (HYDRODIURIL) 25 MG tablet Take 1 tablet (25 mg total) by mouth daily.  ? HYDROcodone bit-homatropine (HYCODAN) 5-1.5 MG/5ML syrup Take 5 mLs by mouth every 8 (eight) hours as needed for cough.  ? INGREZZA 80 MG capsule Take one capsule by mouth daily  ? ipratropium (ATROVENT) 0.06 % nasal spray Place 2 sprays into both nostrils 4 (four) times daily.  ? leflunomide (ARAVA) 20 MG tablet TAKE 1 TABLET BY MOUTH EVERY DAY  ? levofloxacin (LEVAQUIN) 500 MG tablet Take 1 tablet (500 mg total) by mouth daily for 7 days.  ? metoprolol succinate (TOPROL-XL) 50 MG 24 hr tablet TAKE 1 TABLET BY MOUTH  ONCE DAILY FOLLOWING A MEAL  ? mometasone (ELOCON) 0.1 % cream Apply topically daily. Apply sparingly to affected area daily as needed.  ? neomycin-polymyxin-hydrocortisone (CORTISPORIN) OTIC solution Place 3 drops into the left ear 4 (four) times daily.  ? omeprazole (PRILOSEC) 40 MG capsule TAKE 1 CAPSULE BY MOUTH EVERY MORNING AND AT BEDTIME  ? ondansetron (ZOFRAN) 4 MG tablet Take 4 mg by mouth 4 (four) times daily as needed for nausea.  ? oxyCODONE-acetaminophen (PERCOCET) 5-325 MG tablet Take  1 tablet by mouth at bedtime.  ? predniSONE (DELTASONE) 20 MG tablet 3 tabs poqday 1-2, 2 tabs poqday 3-4, 1 tab poqday 5-6  ? QUEtiapine (SEROQUEL) 25 MG tablet Take 25-50 mg by mouth at bedtime.  ? tiZANidine (ZANAFLEX) 4 MG capsule Take 1 capsule (4 mg total) by mouth 3 (three) times daily.  ? tobramycin-dexamethasone (TOBRADEX) ophthalmic solution Place 1 drop into the left eye every 4 (four) hours while awake.  ? triamcinolone cream (KENALOG) 0.1 % APPLY TO AFFECTED AREA TWICE A DAY  ? valsartan (DIOVAN) 320 MG tablet Take 1 tablet (320 mg total) by mouth daily.  ? benzonatate (TESSALON) 200 MG capsule Take 1 capsule (200 mg total) by mouth 3 (three) times daily as needed for cough. (Patient not taking: Reported on 08/07/2021)  ? ?No facility-administered encounter medications on file as of 08/07/2021.  ? ? ?Allergies (verified) ?Latex, Amlodipine besy-benazepril hcl, Amoxicillin-pot clavulanate, Bextra [valdecoxib], Chlorhexidine, Lipitor [atorvastatin calcium], Sulfa antibiotics, and Zocor [simvastatin]  ? ?History: ?Past Medical History:  ?Diagnosis Date  ? Ankylosing spondylitis ([place])   ? Anxiety and depression   ? Depression   ? Fibromyalgia   ? GERD (gastroesophageal reflux disease)   ? Hiatal hernia   ? per patient, dx by GI   ? History of basal cell carcinoma excision   ? [place]  ? History of hiatal hernia   ? History of thrush   ? Hyperlipidemia   ? Hypertension   ? Insomnia   ? Left breast mass   ? OCD (obsessive compulsive disorder)   ? Osteopenia   ? PONV (postoperative nausea and vomiting)   ? Psoriatic arthritis ([place])   ? PVC (premature ventricular contraction)   ? Rheumatoid arthritis ([place])   ? ?Past Surgical History:  ?Procedure Laterality Date  ? BIOPSY  09/29/2019  ? Procedure: BIOPSY;  Surgeon: Ronnette Juniper, MD;  Location: [facility];  Service: Gastroenterology;;  ? BREAST EXCISIONAL BIOPSY Right   ? BREAST EXCISIONAL BIOPSY Left 05/04/2018  ? BREAST LUMPECTOMY WITH RADIOACTIVE  SEED LOCALIZATION Left 05/04/2018  ? Procedure: LEFT BREAST LUMPECTOMY WITH RADIOACTIVE SEED LOCALIZATION AND LEFT BREAST NIPPLE BIOPSY;  Surgeon: Jovita Kussmaul, MD;  Location: [facility];  Service: General;  Laterality: Left;  ? BREAST SURGERY  05/25/1973  ? lumpectomy-- benign  ? BUNIONECTOMY  05/25/1989  ? CATARACT EXTRACTION W/ INTRAOCULAR LENS  IMPLANT, BILATERAL  05/25/1998  ? CERVICAL FUSION  07/24/2011  ? C5 -- C7  ? CESAREAN SECTION  05/26/1983  ? COLONOSCOPY WITH PROPOFOL N/A 09/29/2019  ? Procedure: COLONOSCOPY WITH PROPOFOL;  Surgeon: Ronnette Juniper, MD;  Location: [facility];  Service: Gastroenterology;  Laterality: N/A;  ? DISTAL INTERPHALANGEAL JOINT FUSION Right 03/14/2015  ? Procedure: RIGHT LONG FINGER DISTAL INTERPHALANGEAL JOINT ARTHRODESIS;  Surgeon: Iran Planas, MD;  Location: [facility];  Service: Orthopedics;  Laterality: Right;  ? ESOPHAGOGASTRODUODENOSCOPY (EGD) WITH PROPOFOL N/A 09/29/2019  ? Procedure: ESOPHAGOGASTRODUODENOSCOPY (EGD) WITH PROPOFOL;  Surgeon: Ronnette Juniper, MD;  Location: [facility];  Service: Gastroenterology;  Laterality: N/A;  ? EYE SURGERY  05/25/1993  ? rk (laser surgery), semi cornea transplant, detacted retina,  fluid removal  ? HYSTEROSCOPY WITH D & C N/A 12/09/2020  ? Procedure: DILATATION AND CURETTAGE /HYSTEROSCOPY;  Surgeon: Drema Dallas, DO;  Location: [facility];  Service: Gynecology;  Laterality: N/A;  ? KNEE ARTHROSCOPY Left 03/03/2004  ? POLYPECTOMY  09/29/2019  ? Procedure: POLYPECTOMY;  Surgeon: Ronnette Juniper, MD;  Location: [facility];  Service: Gastroenterology;;  ? POSTERIOR VITRECTOMY RIGHT EYE AND LASER   10/27/1999  ? SHOULDER SURGERY Right 05/26/1995  ? SPINAL FIXATION SURGERY W/ IMPLANT  2013 rod #1//  2014  rod 2  ? S1 -- T10  (rod #1)//   S1 -- T4 (rod #2)  ? THORACIC FUSION  03/13/2013  ? REMOVAL HARDWARE/  BONE GRAFT FUSION T10//  REVISION OF RODS  ? TOTAL KNEE ARTHROPLASTY Left 12/14/2005  ?  UVULOPALATOPHARYNGOPLASTY  04/26/2006  ? w/  TONSILLECTOMY/  TURBINATE REDUCTIONS/  BILATERAL ANTERIOR ETHMOIDECTOMY  ? ?Family History  ?Problem Relation Age of Onset  ? Diabetes Mother   ? Heart disease Mother

## 2021-08-11 ENCOUNTER — Telehealth: Payer: Self-pay | Admitting: Rheumatology

## 2021-08-11 NOTE — Telephone Encounter (Signed)
Patient advised she should not receive a live vaccine (zostavax). ?Shingrix is not a live vaccine so it will be safe while on arava.  ?

## 2021-08-11 NOTE — Telephone Encounter (Signed)
Patient states she is getting the shingles vaccine through the pharmacy. They advised her they will be giving her a live vaccine. Patient wants to know what to do about her [place]. Please advise.  ?

## 2021-08-11 NOTE — Telephone Encounter (Signed)
Patient called the office stating she is getting her shingles vaccine tomorrow and since it is a live vaccine they wanted her to make sure she could get it with the current medications she is on. Patient requests a call back soon since she is getting the vaccine tomorrow. ?

## 2021-08-11 NOTE — Telephone Encounter (Signed)
The patient should not receive a live vaccine (zostavax). ?Shingrix is not a live vaccine so it will be safe while on arava.

## 2021-08-14 ENCOUNTER — Other Ambulatory Visit: Payer: Self-pay

## 2021-08-14 DIAGNOSIS — K219 Gastro-esophageal reflux disease without esophagitis: Secondary | ICD-10-CM

## 2021-08-14 DIAGNOSIS — E46 Unspecified protein-calorie malnutrition: Secondary | ICD-10-CM

## 2021-08-14 MED ORDER — OMEPRAZOLE 40 MG PO CPDR: DELAYED_RELEASE_CAPSULE | ORAL | 1 refills | Status: DC

## 2021-08-19 ENCOUNTER — Telehealth: Payer: Self-pay | Admitting: Pharmacist

## 2021-08-19 ENCOUNTER — Other Ambulatory Visit: Payer: Self-pay

## 2021-08-19 MED ORDER — OXYCODONE-ACETAMINOPHEN 5-325 MG PO TABS: 1.0000 | ORAL_TABLET | Freq: Every day | ORAL | 0 refills | Status: DC

## 2021-08-19 NOTE — Progress Notes (Signed)
? ? ?Chronic Care Management ?Pharmacy Assistant  ? ?Name: Janet Peterson  MRN: [id number] DOB: [birth year] ? ? ?Reason for Encounter: Disease State - General Adherence Call  ?  ? ? ?Recent office visits:  ?08/04/21 Janet Luo, MD - Family Medicine - Bronchitis - benzonatate (TESSALON) 200 MG capsule, levofloxacin (LEVAQUIN) 500 MG tablet, and predniSONE (DELTASONE) 20 MG tablet prescribed. Follow up as needed.  ? ?07/24/21 Janet Luo, MD - Family Medicine - Bronchitis - azithromycin (ZITHROMAX) 250 MG tablet, doxazosin (CARDURA) 2 MG tablet, and HYDROcodone bit-homatropine (HYCODAN) 5-1.5 MG/5ML syrup prescribed. Recheck blood pressure in 2 weeks.  ? ? ?Recent consult visits:  ?08/05/21 Janet Blamer, MD - Rheumatology - No medication changes.  Follow up in 5 months.  ? ?07/31/21 Janet Penna, MD - Physical Medicine - Cervical dystonia -  We will check on her acupuncture benefits and set her up for monthly visits x4 if covered at this office otherwise she can look outside of her health plan. Follow up as scheduled.  ? ? ?Hospital visits:  ?None in previous 6 months ? ? ?Medications: ?Outpatient Encounter Medications as of 08/19/2021  ?Medication Sig Note  ? benzonatate (TESSALON) 200 MG capsule Take 1 capsule (200 mg total) by mouth 3 (three) times daily as needed for cough. (Patient not taking: Reported on 08/07/2021)   ? buPROPion (WELLBUTRIN XL) 300 MG 24 hr tablet Take 300 mg by mouth every morning. 01/15/2021: For depression  ? clonazePAM (KLONOPIN) 1 MG tablet TAKE 1 TABLET BY MOUTH EVERY DAY AS NEEDED FOR ANXIETY (Patient taking differently: Take 1 mg by mouth daily as needed for anxiety.) 11/05/2020: Fill date 10/27/20 #30 today # 21 last taken last pm  ? doxazosin (CARDURA) 2 MG tablet Take 1 tablet (2 mg total) by mouth daily.   ? doxepin (SINEQUAN) 50 MG capsule Take 50 mg by mouth. 01/15/2021: Nightly for sleep  ? ezetimibe (ZETIA) 10 MG tablet TAKE 1 TABLET BY MOUTH EVERY DAY   ? FLUoxetine (PROZAC) 40 MG  capsule Take 40 mg by mouth daily.   ? fluticasone (FLONASE) 50 MCG/ACT nasal spray Place 2 sprays into both nostrils daily as needed for allergies.   ? hydrochlorothiazide (HYDRODIURIL) 25 MG tablet Take 1 tablet (25 mg total) by mouth daily.   ? HYDROcodone bit-homatropine (HYCODAN) 5-1.5 MG/5ML syrup Take 5 mLs by mouth every 8 (eight) hours as needed for cough.   ? INGREZZA 80 MG capsule Take one capsule by mouth daily   ? ipratropium (ATROVENT) 0.06 % nasal spray Place 2 sprays into both nostrils 4 (four) times daily.   ? leflunomide (ARAVA) 20 MG tablet TAKE 1 TABLET BY MOUTH EVERY DAY   ? metoprolol succinate (TOPROL-XL) 50 MG 24 hr tablet TAKE 1 TABLET BY MOUTH ONCE DAILY FOLLOWING A MEAL   ? mometasone (ELOCON) 0.1 % cream Apply topically daily. Apply sparingly to affected area daily as needed.   ? neomycin-polymyxin-hydrocortisone (CORTISPORIN) OTIC solution Place 3 drops into the left ear 4 (four) times daily.   ? omeprazole (PRILOSEC) 40 MG capsule TAKE 1 CAPSULE BY MOUTH EVERY MORNING AND AT BEDTIME   ? ondansetron (ZOFRAN) 4 MG tablet Take 4 mg by mouth 4 (four) times daily as needed for nausea.   ? oxyCODONE-acetaminophen (PERCOCET) 5-325 MG tablet Take 1 tablet by mouth at bedtime.   ? predniSONE (DELTASONE) 20 MG tablet 3 tabs poqday 1-2, 2 tabs poqday 3-4, 1 tab poqday 5-6   ? QUEtiapine (SEROQUEL) 25 MG tablet  Take 25-50 mg by mouth at bedtime.   ? tiZANidine (ZANAFLEX) 4 MG capsule Take 1 capsule (4 mg total) by mouth 3 (three) times daily.   ? tobramycin-dexamethasone (TOBRADEX) ophthalmic solution Place 1 drop into the left eye every 4 (four) hours while awake.   ? triamcinolone cream (KENALOG) 0.1 % APPLY TO AFFECTED AREA TWICE A DAY   ? valsartan (DIOVAN) 320 MG tablet Take 1 tablet (320 mg total) by mouth daily.   ? ?No facility-administered encounter medications on file as of 08/19/2021.  ? ? ? ?Have you had any problems recently with your health? ?Patient reported she has been doing ok  other than having some reflux issues and doesn't feel the omeprazole is working as well. She is taking it twice a day. She does have to take Alkaseltzer sometimes which helps some.  ? ?Have you had any problems with your pharmacy? ?Patient denies any problems with her current pharmacy.  ? ?What issues or side effects are you having with your medications? ?Patient denied any issues or side effects from her medications.  ? ?What would you like me to pass along to Janet Peterson, CPP for them to help you with?  ?Patient did not have anything additional to pass along to CPP at this time.  ? ?What can we do to take care of you better? ?Patient did not have any recommendations at this time. She states overall is she doing well.  ? ? ?Care Gaps ? ?AWV: done 08/07/21 ?Colonoscopy: due 09/28/24 ?DM Eye Exam: N/A ?DM Foot Exam:  N/A ?Microalbumin: unknown  ?HbgAIC: done 06/04/21 (5.7) ?DEXA: done 04/02/21 ?Mammogram: done 05/02/21 ? ? ?Star Rating Drugs: ?Valsartan (DIOVAN) 320 MG tablet - last filled 06/25/21 90 days  ? ? ? ?Future Appointments  ?Date Time Provider [place]  ?09/16/2021  1:30 PM Peterson, Janet Salk, MD [facility]  ?09/23/2021  2:45 PM Peterson, Janet Salk, MD [facility]  ?09/30/2021  1:00 PM Peterson, Janet Salk, MD [facility]  ?10/07/2021 12:45 PM Peterson, Janet Salk, MD [facility]  ?10/23/2021  3:45 PM [facility]-CCM PHARMACIST [facility]  ?01/06/2022  2:20 PM Janet Neas, PA-C [facility] None  ?08/13/2022  2:00 PM [facility]-NURSE HEALTH ADVISOR [facility]  ? ? ? ?Liza Showfety, CCMA ?Clinical Pharmacist Assistant  ?[phone] ? ?

## 2021-08-19 NOTE — Telephone Encounter (Signed)
LOV 07/24/21 ?Last refill 07/23/21, #30, 0 refills ? ?G89.4 ?Please review, thanks! ? ? ?

## 2021-08-30 ENCOUNTER — Other Ambulatory Visit: Payer: Self-pay | Admitting: Family Medicine

## 2021-09-05 ENCOUNTER — Other Ambulatory Visit: Payer: Self-pay | Admitting: Physician Assistant

## 2021-09-05 NOTE — Telephone Encounter (Signed)
Next Visit: 01/06/2022 ? ?Last Visit: 08/05/2021 ? ?Last Fill: 06/06/2021 ? ?DX: Psoriatic arthritis  ? ?Current Dose per office note 08/05/2021:  Arava 20 mg 1 tablet by mouth daily ? ?Labs: 06/04/2021 Glucose is 123.  Sodium and chloride are slightly low.  Total protein is borderline low.  RBC count, hgb, and hct are low consistent with anemia.   ? ?Patient's husband advised patient is due to update labs.  ? ?Okay to refill Arava?  ?

## 2021-09-08 ENCOUNTER — Other Ambulatory Visit: Payer: Self-pay

## 2021-09-08 DIAGNOSIS — Z79899 Other long term (current) drug therapy: Secondary | ICD-10-CM

## 2021-09-09 ENCOUNTER — Other Ambulatory Visit: Payer: Self-pay | Admitting: Diagnostic Neuroimaging

## 2021-09-09 LAB — COMPLETE METABOLIC PANEL WITH GFR
AG Ratio: 1.6 (calc) (ref 1.0–2.5)
ALT: 6 U/L (ref 6–29)
AST: 13 U/L (ref 10–35)
Albumin: 4 g/dL (ref 3.6–5.1)
Alkaline phosphatase (APISO): 113 U/L (ref 37–153)
BUN: 10 mg/dL (ref 7–25)
CO2: 28 mmol/L (ref 20–32)
Calcium: 9.6 mg/dL (ref 8.6–10.4)
Chloride: 90 mmol/L — ABNORMAL LOW (ref 98–110)
Creat: 0.88 mg/dL (ref 0.60–1.00)
Globulin: 2.5 g/dL (calc) (ref 1.9–3.7)
Glucose, Bld: 81 mg/dL (ref 65–99)
Potassium: 4 mmol/L (ref 3.5–5.3)
Sodium: 127 mmol/L — ABNORMAL LOW (ref 135–146)
Total Bilirubin: 0.3 mg/dL (ref 0.2–1.2)
Total Protein: 6.5 g/dL (ref 6.1–8.1)
eGFR: 70 mL/min/{1.73_m2} (ref >=60)

## 2021-09-09 LAB — CBC WITH DIFFERENTIAL/PLATELET
Absolute Monocytes: 667 cells/uL (ref 200–950)
Basophils Absolute: 73 cells/uL (ref 0–200)
Basophils Relative: 1.1 %
Eosinophils Absolute: 73 cells/uL (ref 15–500)
Eosinophils Relative: 1.1 %
HCT: 35.9 % (ref 35.0–45.0)
Hemoglobin: 11.6 g/dL — ABNORMAL LOW (ref 11.7–15.5)
Lymphs Abs: 2006 cells/uL (ref 850–3900)
MCH: 30.9 pg (ref 27.0–33.0)
MCHC: 32.3 g/dL (ref 32.0–36.0)
MCV: 95.5 fL (ref 80.0–100.0)
MPV: 10.9 fL (ref 7.5–12.5)
Monocytes Relative: 10.1 %
Neutro Abs: 3782 cells/uL (ref 1500–7800)
Neutrophils Relative %: 57.3 %
Platelets: 252 10*3/uL (ref 140–400)
RBC: 3.76 10*6/uL — ABNORMAL LOW (ref 3.80–5.10)
RDW: 12.6 % (ref 11.0–15.0)
Total Lymphocyte: 30.4 %
WBC: 6.6 10*3/uL (ref 3.8–10.8)

## 2021-09-09 NOTE — Progress Notes (Signed)
Sodium and chloride remain low and have trended down.  Please notify the patient and forward results to PCP.  Rest of CMP WNL.   ?RBC count had hgb are borderline low but have improved.  Rest of CBC WNL.

## 2021-09-15 ENCOUNTER — Telehealth: Payer: Self-pay | Admitting: *Deleted

## 2021-09-15 NOTE — Telephone Encounter (Signed)
Ok to reapply for visco gel injections for the right knee

## 2021-09-15 NOTE — Telephone Encounter (Signed)
Patient is requesting to apply for visco. Patient had her last injections July 2022. Please advise.  ?

## 2021-09-16 ENCOUNTER — Encounter: Payer: Self-pay | Admitting: Physical Medicine & Rehabilitation

## 2021-09-16 ENCOUNTER — Encounter: Payer: Medicare Other | Attending: Physical Medicine & Rehabilitation | Admitting: Physical Medicine & Rehabilitation

## 2021-09-16 VITALS — BP 127/86 | HR 85 | Ht 63.0 in | Wt 201.0 lb

## 2021-09-16 DIAGNOSIS — G243 Spasmodic torticollis: Secondary | ICD-10-CM | POA: Insufficient documentation

## 2021-09-16 DIAGNOSIS — G8929 Other chronic pain: Secondary | ICD-10-CM | POA: Diagnosis not present

## 2021-09-16 DIAGNOSIS — M542 Cervicalgia: Secondary | ICD-10-CM | POA: Insufficient documentation

## 2021-09-16 NOTE — Patient Instructions (Signed)
Acupuncture Acupuncture is a type of treatment that involves stimulating specific points on your body by inserting thin needles through your skin. Acupuncture is often used to treat pain, but it may also be used to help relieve other types of symptoms. Your health care provider may recommend acupuncture to help treat various conditions, such as: Migraine and tension headaches. Nausea and vomiting after a surgery or cancer treatment. Sudden or severe (acute) pain, or long-term (chronic) pain. Addiction. Acupuncture is based on traditional Chinese medicine, which recognizes more than 2,000 points on the body that connect energy pathways (meridians) through the body. The goal in stimulating these points is to balance the physical, emotional, and mental energy in your body. Acupuncture is done by a health care provider who has specialized training (licensed acupuncture practitioner). Treatment often requires several acupuncture sessions. You may have acupuncture along with other medical treatments. Tell a health care provider about: Any allergies you have. All medicines you are taking, including vitamins, herbs, eye drops, creams, and over-the-counter medicines. Any blood disorders you have. Any surgeries you have had. Any medical conditions you have. Whether you are pregnant or may be pregnant. What are the risks? Generally, this is a safe treatment. However, problems may occur, including: Skin infection. Damage to organs or structures that are under the skin if a needle is placed too deeply. This is rare. What happens before the treatment? Your acupuncture practitioner will ask about your medical history and your symptoms. You may have a physical exam. What happens during the treatment? The exact procedure will depend on your condition and how your acupuncture provider treats it. In general: Your skin will be cleaned with a germ-killing (antiseptic) solution. Your acupuncture practitioner will  open a new set of germ-free (sterile) needles. The needles will be gently inserted into your skin. They will be left in place for a certain amount of time. You may feel a tingling or burning sensation for a very short period of time. Your acupuncture practitioner may: Apply electrical energy to the needles. Adjust the needles in certain ways. After your procedure, the acupuncture practitioner will remove the needles, throw them away, and clean your skin. The procedure may vary among health care providers. What can I expect after the treatment? People react differently to acupuncture. Make sure you ask your acupuncture provider what to expect after your treatment. It is common to have: Minor bruising. Mild pain. A small amount of bleeding. Follow these instructions at home: Follow any instructions given by your provider after the treatment. Keep all follow-up visits. This is important. Where to find more information National Center for Complementary and Integrative Health: www.nccih.nih.gov Contact a health care provider if: You have questions about your reaction to the treatment. You have soreness. You have skin irritation or redness. You have a fever. Summary Acupuncture is a type of treatment that involves stimulating specific points on your body by inserting thin needles through your skin. This treatment is often used to treat pain, but it may also be used to help relieve other types of symptoms. The exact procedure will depend on your condition and how your acupuncture provider treats it. This information is not intended to replace advice given to you by your health care provider. Make sure you discuss any questions you have with your health care provider. Document Revised: 01/15/2021 Document Reviewed: 01/15/2021 Elsevier Patient Education  2023 Elsevier Inc.  

## 2021-09-16 NOTE — Progress Notes (Signed)
Acupuncture treatment #1 ?Indication cervical dystonia with cervicalgia ?Reference :  Tawni Millers al., Medical Acupuncture Vol 30, no. 4 , 2018 ? ? 61m Seirin sterile disposable Needles placed at bilateral GB 21-> GB 20,  ?GV 14-> GV 20 ?Estim 4hx x 30 min  ? ?patient tolerated procedure well.  Repeat treatment with weekly x4 then reassess efficacy ? ?Consider manual acupuncture if flareup ?

## 2021-09-16 NOTE — Telephone Encounter (Signed)
VOB submitted for Synvisc, right knee ?BV pending ?

## 2021-09-22 ENCOUNTER — Other Ambulatory Visit: Payer: Self-pay | Admitting: Family Medicine

## 2021-09-22 NOTE — Telephone Encounter (Signed)
Patient left vm asking for a refill she uses [facility] on [address]. ? ?oxyCODONE-acetaminophen (PERCOCET) 5-325 MG tablet [[id number]]  ?  Order Details ?Dose: 1 tablet Route: Oral Frequency: Daily at bedtime  ?Dispense Quantity: 30 tablet Refills: 0   ?Note to Pharmacy: Chronic Pain. Dx: G89.4  ?     ?Sig: Take 1 tablet by mouth at bedtime.  ?     ?Start Date: 08/19/21 End Date: --  ?Written Date: 08/19/21 Expiration Date: 02/15/22  ?Earliest Fill Date: 08/19/21    ?Original Order:  oxyCODONE-acetaminophen (PERCOCET) 5-325 MG tablet [[id number]]  ? ?

## 2021-09-23 ENCOUNTER — Encounter: Payer: Self-pay | Admitting: Physical Medicine & Rehabilitation

## 2021-09-23 ENCOUNTER — Encounter: Payer: Medicare Other | Attending: Physical Medicine & Rehabilitation | Admitting: Physical Medicine & Rehabilitation

## 2021-09-23 VITALS — BP 138/80 | HR 80 | Temp 98.0°F | Ht 63.0 in | Wt 206.2 lb

## 2021-09-23 DIAGNOSIS — G243 Spasmodic torticollis: Secondary | ICD-10-CM | POA: Diagnosis present

## 2021-09-23 DIAGNOSIS — M542 Cervicalgia: Secondary | ICD-10-CM | POA: Diagnosis present

## 2021-09-23 NOTE — Patient Instructions (Signed)
Acupuncture Acupuncture is a type of treatment that involves stimulating specific points on your body by inserting thin needles through your skin. Acupuncture is often used to treat pain, but it may also be used to help relieve other types of symptoms. Your health care provider may recommend acupuncture to help treat various conditions, such as: Migraine and tension headaches. Nausea and vomiting after a surgery or cancer treatment. Sudden or severe (acute) pain, or long-term (chronic) pain. Addiction. Acupuncture is based on traditional Chinese medicine, which recognizes more than 2,000 points on the body that connect energy pathways (meridians) through the body. The goal in stimulating these points is to balance the physical, emotional, and mental energy in your body. Acupuncture is done by a health care provider who has specialized training (licensed acupuncture practitioner). Treatment often requires several acupuncture sessions. You may have acupuncture along with other medical treatments. Tell a health care provider about: Any allergies you have. All medicines you are taking, including vitamins, herbs, eye drops, creams, and over-the-counter medicines. Any blood disorders you have. Any surgeries you have had. Any medical conditions you have. Whether you are pregnant or may be pregnant. What are the risks? Generally, this is a safe treatment. However, problems may occur, including: Skin infection. Damage to organs or structures that are under the skin if a needle is placed too deeply. This is rare. What happens before the treatment? Your acupuncture practitioner will ask about your medical history and your symptoms. You may have a physical exam. What happens during the treatment? The exact procedure will depend on your condition and how your acupuncture provider treats it. In general: Your skin will be cleaned with a germ-killing (antiseptic) solution. Your acupuncture practitioner will  open a new set of germ-free (sterile) needles. The needles will be gently inserted into your skin. They will be left in place for a certain amount of time. You may feel a tingling or burning sensation for a very short period of time. Your acupuncture practitioner may: Apply electrical energy to the needles. Adjust the needles in certain ways. After your procedure, the acupuncture practitioner will remove the needles, throw them away, and clean your skin. The procedure may vary among health care providers. What can I expect after the treatment? People react differently to acupuncture. Make sure you ask your acupuncture provider what to expect after your treatment. It is common to have: Minor bruising. Mild pain. A small amount of bleeding. Follow these instructions at home: Follow any instructions given by your provider after the treatment. Keep all follow-up visits. This is important. Where to find more information National Center for Complementary and Integrative Health: www.nccih.nih.gov Contact a health care provider if: You have questions about your reaction to the treatment. You have soreness. You have skin irritation or redness. You have a fever. Summary Acupuncture is a type of treatment that involves stimulating specific points on your body by inserting thin needles through your skin. This treatment is often used to treat pain, but it may also be used to help relieve other types of symptoms. The exact procedure will depend on your condition and how your acupuncture provider treats it. This information is not intended to replace advice given to you by your health care provider. Make sure you discuss any questions you have with your health care provider. Document Revised: 01/15/2021 Document Reviewed: 01/15/2021 Elsevier Patient Education  2023 Elsevier Inc.  

## 2021-09-23 NOTE — Progress Notes (Signed)
Acupuncture treatment #1 ?Indication cervical dystonia with cervicalgia ?Reference :  Tawni Millers al., Medical Acupuncture Vol 30, no. 4 , 2018 ? ?Auricular pt: Shen Men ? 42m Seirin sterile disposable Needles placed at bilateral GB 21-> GB 20,  ?GV 14-> GV 20 ?Estim 4hx x 30 min  ? ?patient tolerated procedure well.  Repeat treatment with weekly x4 then reassess efficacy ? ?Consider manual acupuncture if flareup ?

## 2021-09-23 NOTE — Telephone Encounter (Signed)
Requested medication (s) are due for refill today - yes ? ?Requested medication (s) are on the active medication list -yes ? ?Future visit scheduled -no ? ?Last refill: 08/19/21 #30 ? ?Notes to clinic: Request RF: non delegated Rx  ? ?Requested Prescriptions  ?Pending Prescriptions Disp Refills  ? oxyCODONE-acetaminophen (PERCOCET) 5-325 MG tablet 30 tablet 0  ?  Sig: Take 1 tablet by mouth at bedtime.  ?  ? Not Delegated - Analgesics:  Opioid Agonist Combinations Failed - 09/22/2021  3:44 PM  ?  ?  Failed - This refill cannot be delegated  ?  ?  Failed - Urine Drug Screen completed in last 360 days  ?  ?  Passed - Valid encounter within last 3 months  ?  Recent Outpatient Visits   ? ?      ? 1 month ago Bronchitis  ? [facility] [place], Cammie Mcgee, MD  ? 2 months ago Bronchitis  ? [facility] Pickard, Cammie Mcgee, MD  ? 2 months ago Anemia, unspecified type  ? [facility] Pickard, Cammie Mcgee, MD  ? 4 months ago Acute non-recurrent pansinusitis  ? [place] Jessica A, NP  ? 5 months ago Rash  ? [place] Eulogio Bear, NP  ? ?  ?  ?Future Appointments   ? ?        ? In 3 months [place] [facility]  ? ?  ? ? ?  ?  ?  ? ? ? ?Requested Prescriptions  ?Pending Prescriptions Disp Refills  ? oxyCODONE-acetaminophen (PERCOCET) 5-325 MG tablet 30 tablet 0  ?  Sig: Take 1 tablet by mouth at bedtime.  ?  ? Not Delegated - Analgesics:  Opioid Agonist Combinations Failed - 09/22/2021  3:44 PM  ?  ?  Failed - This refill cannot be delegated  ?  ?  Failed - Urine Drug Screen completed in last 360 days  ?  ?  Passed - Valid encounter within last 3 months  ?  Recent Outpatient Visits   ? ?      ? 1 month ago Bronchitis  ? [facility] Pickard, Cammie Mcgee, MD  ? 2 months ago Bronchitis  ? [facility] Pickard, Cammie Mcgee, MD  ? 2 months ago Anemia, unspecified type  ? [facility] Pickard, Cammie Mcgee, MD  ? 4 months ago Acute non-recurrent pansinusitis  ? [place], Jessica A, NP  ? 5 months ago Rash  ? [place] Eulogio Bear, NP  ? ?  ?  ?Future Appointments   ? ?        ? In 3 months [facility]  ? ?  ? ? ?  ?  ?  ? ? ? ?

## 2021-09-24 NOTE — Telephone Encounter (Signed)
Please call to schedule visco injections. ? ?Approved for Synvisc, right knee ?[place] ?Covered at 100% after co-pay ?$30 co-pay ?Deductible does not apply ?No pre-certifications ?

## 2021-09-25 MED ORDER — OXYCODONE-ACETAMINOPHEN 5-325 MG PO TABS: 1.0000 | ORAL_TABLET | Freq: Every day | ORAL | 0 refills | Status: DC

## 2021-09-25 NOTE — Telephone Encounter (Signed)
LOV 08/04/21 ?Last refill 08/19/21, #30, 0 refills ? ?Please review, thanks! ? ?

## 2021-09-30 ENCOUNTER — Encounter: Payer: Medicare Other | Admitting: Physical Medicine & Rehabilitation

## 2021-09-30 ENCOUNTER — Encounter: Payer: Self-pay | Admitting: Physical Medicine & Rehabilitation

## 2021-09-30 VITALS — BP 148/97 | HR 80 | Ht 63.0 in | Wt 202.6 lb

## 2021-09-30 DIAGNOSIS — G243 Spasmodic torticollis: Secondary | ICD-10-CM | POA: Diagnosis not present

## 2021-09-30 DIAGNOSIS — M542 Cervicalgia: Secondary | ICD-10-CM | POA: Diagnosis not present

## 2021-09-30 NOTE — Progress Notes (Signed)
Acupuncture treatment #3 ?Indication cervical dystonia with cervicalgia ?Reference :  Tawni Millers al., Medical Acupuncture Vol 30, no. 4 , 2018 ? ?Auricular pt: Shen Men ? 65m Seirin sterile disposable Needles placed at bilateral GB 21-> GB 20,  ?GV 14-> GV 20 ?Estim '100Hz'$   x 30 min  ? ?patient tolerated procedure well.  Repeat treatment with weekly x4 then reassess efficacy ? ?Consider manual acupuncture if flareup ?

## 2021-10-07 ENCOUNTER — Ambulatory Visit: Payer: Medicare Other | Admitting: Physical Medicine & Rehabilitation

## 2021-10-09 ENCOUNTER — Other Ambulatory Visit: Payer: Self-pay | Admitting: Family Medicine

## 2021-10-09 ENCOUNTER — Other Ambulatory Visit: Payer: Self-pay | Admitting: Physician Assistant

## 2021-10-09 NOTE — Telephone Encounter (Signed)
Next Visit: 11/10/2021  Last Visit: 08/05/2021  Last Fill: 09/05/2021  DX: Psoriatic arthritis   Current Dose per office note 08/05/2021:  Arava 20 mg 1 tablet by mouth daily  Labs: 09/08/2021, Sodium and chloride remain low and have trended down.  Please notify the patient and forward results to PCP.  Rest of CMP WNL.   RBC count had hgb are borderline low but have improved.  Rest of CBC WNL.   Okay to refill Arava?

## 2021-10-09 NOTE — Telephone Encounter (Signed)
LRF 07/24/21  #30  3 refills. Pt has refills available. Requested Prescriptions  Pending Prescriptions Disp Refills  . doxazosin (CARDURA) 2 MG tablet [Pharmacy Med Name: DOXAZOSIN MESYLATE 2 MG TAB] 90 tablet 1    Sig: TAKE 1 TABLET BY MOUTH EVERY DAY     Cardiovascular:  Alpha Blockers Failed - 10/09/2021  3:15 PM      Failed - Last BP in normal range    BP Readings from Last 1 Encounters:  09/30/21 (!) 148/97         Passed - Valid encounter within last 6 months    Recent Outpatient Visits          2 months ago [place] Schaumann, Cammie Mcgee, MD   2 months ago [place] Dennard Schaumann, Cammie Mcgee, MD   3 months ago Anemia, unspecified type   [place] Susy Frizzle, MD   4 months ago Acute non-recurrent pansinusitis   [place] Eulogio Bear, NP   6 months ago [place] Eulogio Bear, NP      Future Appointments            In 4 days Pickard, Cammie Mcgee, MD [place], [place]   In 2 months [place] [facility] [place] [facility]

## 2021-10-13 ENCOUNTER — Other Ambulatory Visit: Payer: Self-pay | Admitting: Family Medicine

## 2021-10-13 ENCOUNTER — Ambulatory Visit (INDEPENDENT_AMBULATORY_CARE_PROVIDER_SITE_OTHER): Payer: Medicare Other | Admitting: Family Medicine

## 2021-10-13 VITALS — BP 152/92 | HR 65 | Temp 97.9°F | Ht 63.0 in | Wt 202.0 lb

## 2021-10-13 DIAGNOSIS — M961 Postlaminectomy syndrome, not elsewhere classified: Secondary | ICD-10-CM | POA: Diagnosis not present

## 2021-10-13 DIAGNOSIS — E871 Hypo-osmolality and hyponatremia: Secondary | ICD-10-CM | POA: Diagnosis not present

## 2021-10-13 MED ORDER — PREGABALIN 75 MG PO CAPS: 75.0000 mg | ORAL_CAPSULE | Freq: Two times a day (BID) | ORAL | 0 refills | Status: DC

## 2021-10-13 MED ORDER — OXYCODONE-ACETAMINOPHEN 5-325 MG PO TABS: 1.0000 | ORAL_TABLET | Freq: Every day | ORAL | 0 refills | Status: DC

## 2021-10-13 MED ORDER — DEXLANSOPRAZOLE 60 MG PO CPDR: 60.0000 mg | DELAYED_RELEASE_CAPSULE | Freq: Every day | ORAL | 3 refills | Status: DC

## 2021-10-13 NOTE — Progress Notes (Signed)
Subjective:    Patient ID: Janet Peterson, female    DOB: Aug 13, 1950, 71 y.o.   MRN: [id number]  Patient is a 71 year old Caucasian female who has a history of several surgeries on her back and is now dealing with postlaminectomy syndrome.  She would like a second opinion with a different neurosurgeon to see if she has any additional options.  She reports severe pain in the middle of her back when she tries to walk more than.  She is taking 1 oxycodone today.  She has tried gabapentin in the past.  Gabapentin provided her no relief.  She would like to try to increase oxycodone to 2 a day which I believe is reasonable.  She denies any leg weakness.  She denies any bowel or bladder incontinence.  There is no evidence of paralysis.  However she has constant severe pain in her mid back.  Also recently, the patient was found to have hyponatremia.  She is currently on hydrochlorothiazide.  She does not appear to be fluid overloaded on exam although she does have trace bipedal edema. Past Medical History:  Diagnosis Date   Ankylosing spondylitis ([place])    Anxiety and depression    Depression    Fibromyalgia    GERD (gastroesophageal reflux disease)    Hiatal hernia    per patient, dx by GI    History of basal cell carcinoma excision    FACE, 1992  &  1996   History of hiatal hernia    History of thrush    Hyperlipidemia    Hypertension    Insomnia    Left breast mass    OCD (obsessive compulsive disorder)    Osteopenia    PONV (postoperative nausea and vomiting)    Psoriatic arthritis (HCC)    PVC (premature ventricular contraction)    Rheumatoid arthritis ([place])    Past Surgical History:  Procedure Laterality Date   BIOPSY  09/29/2019   Procedure: BIOPSY;  Surgeon: Ronnette Juniper, MD;  Location: [facility];  Service: Gastroenterology;;   BREAST EXCISIONAL BIOPSY Right    BREAST EXCISIONAL BIOPSY Left 05/04/2018   BREAST LUMPECTOMY WITH RADIOACTIVE SEED LOCALIZATION Left 05/04/2018    Procedure: LEFT BREAST LUMPECTOMY WITH RADIOACTIVE SEED LOCALIZATION AND LEFT BREAST NIPPLE BIOPSY;  Surgeon: Jovita Kussmaul, MD;  Location: [place];  Service: General;  Laterality: Left;   BREAST SURGERY  05/25/1973   lumpectomy-- benign   BUNIONECTOMY  05/25/1989   CATARACT EXTRACTION W/ INTRAOCULAR LENS  IMPLANT, BILATERAL  05/25/1998   CERVICAL FUSION  07/24/2011   C5 -- C7   CESAREAN SECTION  05/26/1983   COLONOSCOPY WITH PROPOFOL N/A 09/29/2019   Procedure: COLONOSCOPY WITH PROPOFOL;  Surgeon: Ronnette Juniper, MD;  [facility]: [facility];  Service: Gastroenterology;  Laterality: N/A;   DISTAL INTERPHALANGEAL JOINT FUSION Right 03/14/2015   Procedure: RIGHT LONG FINGER DISTAL INTERPHALANGEAL JOINT ARTHRODESIS;  Surgeon: Iran Planas, MD;  Location: [place];  Service: Orthopedics;  Laterality: Right;   ESOPHAGOGASTRODUODENOSCOPY (EGD) WITH PROPOFOL N/A 09/29/2019   Procedure: ESOPHAGOGASTRODUODENOSCOPY (EGD) WITH PROPOFOL;  Surgeon: Ronnette Juniper, MD;  [facility];  Service: Gastroenterology;  Laterality: N/A;   EYE SURGERY  05/25/1993   rk (laser surgery), semi cornea transplant, detacted retina,  fluid removal   HYSTEROSCOPY WITH D & C N/A 12/09/2020   Procedure: DILATATION AND CURETTAGE /HYSTEROSCOPY;  Surgeon: Drema Dallas, DO;  Location: [place];  Service: Gynecology;  Laterality: N/A;   KNEE  ARTHROSCOPY Left 03/03/2004   POLYPECTOMY  09/29/2019   Procedure: POLYPECTOMY;  Surgeon: Ronnette Juniper, MD;  [facility];  Service: Gastroenterology;;   POSTERIOR VITRECTOMY RIGHT EYE AND LASER   10/27/1999   SHOULDER SURGERY Right 05/26/1995   SPINAL FIXATION SURGERY W/ IMPLANT  2013 rod #1//  2014  rod 2   S1 -- T10  (rod #1)//   S1 -- T4 (rod #2)   THORACIC FUSION  03/13/2013   REMOVAL HARDWARE/  BONE GRAFT FUSION T10//  REVISION OF RODS   TOTAL KNEE ARTHROPLASTY Left 12/14/2005   UVULOPALATOPHARYNGOPLASTY   04/26/2006   w/  TONSILLECTOMY/  TURBINATE REDUCTIONS/  BILATERAL ANTERIOR ETHMOIDECTOMY   Current Outpatient Medications on File Prior to Visit  Medication Sig Dispense Refill   buPROPion (WELLBUTRIN XL) 300 MG 24 hr tablet Take 300 mg by mouth every morning.  1   clonazePAM (KLONOPIN) 1 MG tablet TAKE 1 TABLET BY MOUTH EVERY DAY AS NEEDED FOR ANXIETY (Patient taking differently: Take 1 mg by mouth daily as needed for anxiety.) 30 tablet 1   doxazosin (CARDURA) 2 MG tablet Take 1 tablet (2 mg total) by mouth daily. 30 tablet 3   doxepin (SINEQUAN) 50 MG capsule Take 50 mg by mouth.     ezetimibe (ZETIA) 10 MG tablet TAKE 1 TABLET BY MOUTH EVERY DAY 90 tablet 3   FLUoxetine (PROZAC) 20 MG capsule Take 60 mg by mouth daily.     FLUoxetine (PROZAC) 40 MG capsule Take 40 mg by mouth daily.     fluticasone (FLONASE) 50 MCG/ACT nasal spray Place 2 sprays into both nostrils daily as needed for allergies. 16 g 11   hydrochlorothiazide (HYDRODIURIL) 25 MG tablet Take 1 tablet (25 mg total) by mouth daily. 90 tablet 3   HYDROcodone bit-homatropine (HYCODAN) 5-1.5 MG/5ML syrup Take 5 mLs by mouth every 8 (eight) hours as needed for cough. 120 mL 0   INGREZZA 80 MG capsule Take one capsule by mouth daily 30 capsule 1   leflunomide (ARAVA) 20 MG tablet TAKE 1 TABLET BY MOUTH EVERY DAY 90 tablet 0   metoprolol succinate (TOPROL-XL) 50 MG 24 hr tablet TAKE 1 TABLET BY MOUTH ONCE DAILY FOLLOWING A MEAL 90 tablet 3   omeprazole (PRILOSEC) 40 MG capsule TAKE 1 CAPSULE BY MOUTH EVERY MORNING AND AT BEDTIME 180 capsule 1   ondansetron (ZOFRAN) 4 MG tablet Take 4 mg by mouth 4 (four) times daily as needed for nausea.     oxyCODONE-acetaminophen (PERCOCET) 5-325 MG tablet Take 1 tablet by mouth at bedtime. 30 tablet 0   QUEtiapine (SEROQUEL) 25 MG tablet Take 25-50 mg by mouth at bedtime.     tiZANidine (ZANAFLEX) 4 MG capsule Take 1 capsule (4 mg total) by mouth 3 (three) times daily. 30 capsule 1    tobramycin-dexamethasone (TOBRADEX) ophthalmic solution Place 1 drop into the left eye every 4 (four) hours while awake. 5 mL 0   triamcinolone cream (KENALOG) 0.1 % APPLY TO AFFECTED AREA TWICE A DAY 30 g 0   valsartan (DIOVAN) 320 MG tablet Take 1 tablet (320 mg total) by mouth daily. 90 tablet 3   No current facility-administered medications on file prior to visit.   Allergies  Allergen Reactions   Latex Rash    And Mouth sores Other reaction(s): rash Other reaction(s): rash Other reaction(s): rash   Amlodipine Besy-Benazepril Hcl Other (See Comments)    COUGH   Amoxicillin-Pot Clavulanate Diarrhea and Nausea And Vomiting  Other reaction(s): sick on stomach Other reaction(s): sick on stomach   Bextra [Valdecoxib] Diarrhea and Nausea And Vomiting    REFLUX   Chlorhexidine     rash Other reaction(s): rash all over body Other reaction(s): rash all over body   Lipitor [Atorvastatin Calcium] Other (See Comments)    MYALGIAS   Sulfa Antibiotics Nausea And Vomiting    CRAMPING Other reaction(s): stomach hurts Other reaction(s): stomach hurts   Zocor [Simvastatin] Other (See Comments)    MYALGIA   Social History   Socioeconomic History   Marital status: Married    Spouse name: Daryl   Number of children: Not on file   Years of education: Not on file   Highest education level: Not on file  Occupational History    Comment: retired  Tobacco Use   Smoking status: Former    Packs/day: 0.50    Years: 10.00    Pack years: 5.00    Types: Cigarettes    Quit date: 03/07/1995    Years since quitting: 26.6    Passive exposure: Never   Smokeless tobacco: Never  Vaping Use   Vaping Use: Never used  Substance and Sexual Activity   Alcohol use: Yes    Comment: rare   Drug use: Never   Sexual activity: Not on file  Other Topics Concern   Not on file  Social History Narrative   Lives with husband   Social Determinants of Health   Financial Resource Strain: Low Risk     Difficulty of Paying Living Expenses: Not hard at all  Food Insecurity: No Food Insecurity   Worried About Charity fundraiser in the Last Year: Never true   Ran Out of Food in the Last Year: Never true  Transportation Needs: No Transportation Needs   Lack of Transportation (Medical): No   Lack of Transportation (Non-Medical): No  Physical Activity: Inactive   Days of Exercise per Week: 0 days   Minutes of Exercise per Session: 0 min  Stress: No Stress Concern Present   Feeling of Stress : Not at all  Social Connections: Socially Integrated   Frequency of Communication with Friends and Family: More than three times a week   Frequency of Social Gatherings with Friends and Family: More than three times a week   Attends Religious Services: More than 4 times per year   Active Member of [employer] or Organizations: Yes   Attends Music therapist: More than 4 times per year   Marital Status: Married  Human resources officer Violence: Not At Risk   Fear of Current or Ex-Partner: No   Emotionally Abused: No   Physically Abused: No   Sexually Abused: No    Review of Systems  Gastrointestinal:  Positive for abdominal pain.  All other systems reviewed and are negative.     Objective:   Physical Exam Vitals reviewed.  Constitutional:      General: She is not in acute distress.    Appearance: Normal appearance. She is normal weight. She is not ill-appearing or toxic-appearing.  HENT:     Right Ear: Tympanic membrane normal. No swelling or tenderness.     Left Ear: Tympanic membrane and external ear normal. No decreased hearing noted. Swelling and tenderness present.  No middle ear effusion. No foreign body. No mastoid tenderness.  Cardiovascular:     Rate and Rhythm: Normal rate and regular rhythm.     Heart sounds: Normal heart sounds. No murmur heard.   No friction  rub. No gallop.  Pulmonary:     Effort: Pulmonary effort is normal. No respiratory distress.     Breath sounds:  Wheezing present. No rhonchi or rales.  Abdominal:     General: Abdomen is flat. Bowel sounds are normal. There is no distension.     Palpations: Abdomen is soft.     Tenderness: There is no abdominal tenderness.  Musculoskeletal:     Thoracic back: Deformity, spasms, tenderness and bony tenderness present. Decreased range of motion.     Lumbar back: Tenderness and bony tenderness present. Decreased range of motion.     Right lower leg: No edema.     Left lower leg: No edema.  Neurological:     Mental Status: She is alert.          Assessment & Plan:  Hyponatremia - Plan: BASIC METABOLIC PANEL WITH GFR  Thoracic postlaminectomy syndrome - Plan: Ambulatory referral to Neurosurgery I will certainly arrange a second opinion with neurosurgery to discuss any other options she has for intervention.  However I have recommended trying Lyrica 75 mg twice daily to see if this will help the pain in her back.  I will also increase her oxycodone to 60 tablets a month which will be 2 a day.  Meanwhile I will check a BMP.  If her sodium level remains low I will temporarily discontinue her hydrochlorothiazide.  If persistent after that I would recommend fluid restriction.

## 2021-10-14 ENCOUNTER — Telehealth: Payer: Self-pay

## 2021-10-14 ENCOUNTER — Other Ambulatory Visit: Payer: Self-pay | Admitting: Family Medicine

## 2021-10-14 LAB — BASIC METABOLIC PANEL WITHOUT GFR
BUN: 11 mg/dL (ref 7–25)
CO2: 29 mmol/L (ref 20–32)
Calcium: 9.8 mg/dL (ref 8.6–10.4)
Chloride: 93 mmol/L — ABNORMAL LOW (ref 98–110)
Creat: 0.77 mg/dL (ref 0.60–1.00)
Glucose, Bld: 146 mg/dL — ABNORMAL HIGH (ref 65–99)
Potassium: 4 mmol/L (ref 3.5–5.3)
Sodium: 130 mmol/L — ABNORMAL LOW (ref 135–146)
eGFR: 82 mL/min/{1.73_m2}

## 2021-10-14 NOTE — Telephone Encounter (Signed)
Pt called in stated that you increased her oxycodone to taking 2 tables per day?   The sig is not correct? Pls advice

## 2021-10-14 NOTE — Telephone Encounter (Signed)
Requested medications are due for refill today.  unsure  Requested medications are on the active medications list.  yes  Last refill. 10/13/2021 #30 3 refills  Future visit scheduled.   no  Notes to clinic.  Note from Pharmacy:  Pharmacy comment: Alternative Requested:STEP THERAPY REQUIRED. PLEASE ADVISE.    Requested Prescriptions  Pending Prescriptions Disp Refills   dexlansoprazole (DEXILANT) 60 MG capsule [Pharmacy Med Name: DEXLANSOPRAZOLE DR 60 MG CAP] 30 capsule 3    Sig: TAKE 1 CAPSULE BY MOUTH EVERY DAY     Gastroenterology: Proton Pump Inhibitors Passed - 10/13/2021  4:08 PM      Passed - Valid encounter within last 12 months    Recent Outpatient Visits           Yesterday Hyponatremia   [place] Susy Frizzle, MD   2 months ago [place] Dennard Schaumann, Cammie Mcgee, MD   2 months ago [place] Dennard Schaumann, Cammie Mcgee, MD   3 months ago Anemia, unspecified type   [place] Susy Frizzle, MD   4 months ago Acute non-recurrent pansinusitis   [place] Eulogio Bear, NP       Future Appointments             In 2 months [place] [facility] [place] [facility]

## 2021-10-15 NOTE — Telephone Encounter (Signed)
Pt would like for you to resend the Sig directions for the Oxycodone to reflect that pt is taking 2 tablets so that her insurance will cover it.    Currently  reads: Sig: Take 1 tablet by mouth at bedtime.  Thank you.

## 2021-10-16 ENCOUNTER — Other Ambulatory Visit: Payer: Self-pay | Admitting: Family Medicine

## 2021-10-16 MED ORDER — OXYCODONE-ACETAMINOPHEN 5-325 MG PO TABS: 1.0000 | ORAL_TABLET | Freq: Three times a day (TID) | ORAL | 0 refills | Status: DC | PRN

## 2021-10-16 NOTE — Telephone Encounter (Addendum)
Pharmacy will not be able to fill medication-  dexlansoprazole (DEXILANT) 60 MG .   Daryn Wollard Key: [id number] - PA Case ID: [id number] Need to send in PA. PA started 10/14/21

## 2021-10-22 NOTE — Telephone Encounter (Signed)
Appro[place] May 25   Request Reference Number: [id number]. DEXLANSOPRAZ CAP '30MG'$  DR is approved through 05/24/2022. Your patient may now fill this prescription and it will be covered.

## 2021-10-23 ENCOUNTER — Other Ambulatory Visit: Payer: Medicare Other

## 2021-10-23 ENCOUNTER — Ambulatory Visit: Payer: Medicare Other | Admitting: Pharmacist

## 2021-10-23 DIAGNOSIS — I10 Essential (primary) hypertension: Secondary | ICD-10-CM

## 2021-10-23 DIAGNOSIS — E7800 Pure hypercholesterolemia, unspecified: Secondary | ICD-10-CM

## 2021-10-23 NOTE — Progress Notes (Signed)
Chronic Care Management Pharmacy Note  10/23/2021 Name:  Janet Peterson MRN:  [id number] DOB:  02-Sep-1950  Summary: PharmD FU - holding HCTZ while we check sodium.  BP has been slightly elevated.  Recommendations/Changes made from today's visit: Follow BP - resume HCTZ if able  Plan: FU 6 months   Subjective: Janet Peterson is an 71 y.o. year old female who is a primary patient of Pickard, Cammie Mcgee, MD.  The CCM team was consulted for assistance with disease management and care coordination needs.    Engaged with patient by telephone for follow up visit in response to provider referral for pharmacy case management and/or care coordination services.   Consent to Services:  The patient was given the following information about Chronic Care Management services today, agreed to services, and gave verbal consent: 1. CCM service includes personalized support from designated clinical staff supervised by the primary care provider, including individualized plan of care and coordination with other care providers 2. 24/7 contact phone numbers for assistance for urgent and routine care needs. 3. Service will only be billed when office clinical staff spend 20 minutes or more in a month to coordinate care. 4. Only one practitioner may furnish and bill the service in a calendar month. 5.The patient may stop CCM services at any time (effective at the end of the month) by phone call to the office staff. 6. The patient will be responsible for cost sharing (co-pay) of up to 20% of the service fee (after annual deductible is met). Patient agreed to services and consent obtained.  Patient Care Team: Susy Frizzle, MD as PCP - General (Family Medicine) Bo Merino, MD as Consulting Physician (Rheumatology) Edythe Clarity, [facility] as Pharmacist (Pharmacist)  Recent office visits:  01/31/21 Mirna Mires, MD - [place] - cyclobenzaprine (FLEXERIL) 10 MG tablet, hydrochlorothiazide (HYDRODIURIL) 25 MG  tablet, ipratropium (ATROVENT) 0.06 % nasal spray, triamcinolone cream (KENALOG) 0.1 % were prescribed at this visit. Follow up as needed or no improvement.     Recent consult visits:  03/11/21 Alysia Penna, MD - Cervical Dystonia- Physical Medicine - No medication changes. Will follow up in 4 weeks for Botox to left side of neck.   02/27/21 Hazel Sams, PA-C - Psoriatic arthritis - No medication changes. Return in 5 months.    02/11/21 Alysia Penna, MD - Physical Medicine - Primary Osteoarthritis - Right first carpometacarpal joint injection under ultrasound guidance without complication. Follow up in 3 weeks.    12/17/20 Darcus Pester, MD - Harriet Pho disease - Right first extensor compartment injection Indication stenosing tenosynovitis right APL right EPB De Quervain's syndrome right side without complication. After care instructions given. Follow up in 4 weeks for Right first carpometacarpal injection under ultrasound guidance.   11/27/20 Janeth Rase, NP - Major Depressive Disorder - No notes available.    11/26/20 [place] - Pre procedure - No notes available.    11/05/20 Darcus Pester, MD - OBGYN - Myofascial pai syndrome of thoracic spine - Referral placed to physical therapy. Trigger Point Injection Bilateral middle trap, Rhomboid major, infraspinatus completed in office without complication. After care instructions were given. Follow up as indicated.    10/23/20 [place] - Abnormal findings on diagnostic imaging - No notes available.    10/17/20 Ronnette Juniper - Abnormal findings on diagnostic imaging of other abdominal regions - No notes available.    10/17/20 Lavonia Dana - Diagnostic Radiology - Endometrial hyperplasia - No noted  available.      Hospital visits: 12/09/20   Medication Reconciliation was completed by comparing discharge summary, patient's EMR and Pharmacy list, and upon discussion with patient.   Admitted to the hospital on 12/09/20  due to Thickened Endometrium. Discharge date was 12/09/20. Discharged from [place]?Medications Started at [facility] Discharge:?? oxyCODONE-acetaminophen (PERCOCET) 5-325 MG tablet 1 tablet at bedtime.    Medication Changes at Hospital Discharge: No changes noted.    Medications Discontinued at Hospital Discharge: No medications were discontinued at discharge.    Medications that remain the same after Hospital Discharge:??  All other medications will remain the same.     Objective:  Lab Results  Component Value Date   CREATININE 0.77 10/13/2021   BUN 11 10/13/2021   GFRNONAA 70 09/26/2020   GFRAA 82 09/26/2020   NA 130 (L) 10/13/2021   K 4.0 10/13/2021   CALCIUM 9.8 10/13/2021   CO2 29 10/13/2021   GLUCOSE 146 (H) 10/13/2021    Lab Results  Component Value Date/Time   HGBA1C 5.7 (H) 06/04/2021 10:30 AM   HGBA1C 6.1 (H) 02/19/2020 03:23 PM    Last diabetic Eye exam: No results found for: HMDIABEYEEXA  Last diabetic Foot exam: No results found for: HMDIABFOOTEX   Lab Results  Component Value Date   CHOL 191 06/04/2021   HDL 58 06/04/2021   LDLCALC 108 (H) 06/04/2021   TRIG 133 06/04/2021   CHOLHDL 3.3 06/04/2021       Latest Ref Rng & Units 09/08/2021    3:26 PM 06/04/2021   10:30 AM 01/30/2021    3:59 PM  Hepatic Function  Total Protein 6.1 - 8.1 g/dL 6.5   6.0   6.5    AST 10 - 35 U/L _0 ALT 6 - 29 U/L _1 Total Bilirubin 0.2 - 1.2 mg/dL 0.3   0.4   0.3      Lab Results  Component Value Date/Time   TSH 1.55 06/05/2019 04:27 PM   TSH 0.550 02/11/2015 12:53 PM       Latest Ref Rng & Units 09/08/2021    3:26 PM 06/04/2021   10:30 AM 01/30/2021    3:59 PM  CBC  WBC 3.8 - 10.8 Thousand/uL 6.6   4.6   6.5    Hemoglobin 11.7 - 15.5 g/dL 11.6   10.8   11.7    Hematocrit 35.0 - 45.0 % 35.9   32.7   36.5    Platelets 140 - 400 Thousand/uL 252   214   226      Lab Results  Component Value Date/Time   VD25OH 32  02/24/2018 04:15 PM   VD25OH 45 04/20/2017 04:14 PM    Clinical ASCVD: No  The 10-year ASCVD risk score (Arnett DK, et al., 2019) is: 18.8%   Values used to calculate the score:     Age: 36 years     Sex: Female     Is Non-Hispanic African American: No     Diabetic: No     Tobacco smoker: No     Systolic Blood Pressure: 956 mmHg     Is BP treated: Yes     HDL Cholesterol: 58 mg/dL     Total Cholesterol: 191 mg/dL       09/23/2021    2:43 PM 09/16/2021    1:28 PM 08/07/2021  2:13 PM  Depression screen PHQ 2/9  Decreased Interest 1 0 0  Down, Depressed, Hopeless 1 0 0  PHQ - 2 Score 2 0 0    Social History   Tobacco Use  Smoking Status Former   Packs/day: 0.50   Years: 10.00   Pack years: 5.00   Types: Cigarettes   Quit date: 03/07/1995   Years since quitting: 26.6   Passive exposure: Never  Smokeless Tobacco Never   BP Readings from Last 3 Encounters:  10/13/21 (!) 152/92  09/30/21 (!) 148/97  09/23/21 138/80   Pulse Readings from Last 3 Encounters:  10/13/21 65  09/30/21 80  09/23/21 80   Wt Readings from Last 3 Encounters:  10/13/21 202 lb (91.6 kg)  09/30/21 202 lb 9.6 oz (91.9 kg)  09/23/21 206 lb 3.2 oz (93.5 kg)   BMI Readings from Last 3 Encounters:  10/13/21 35.78 kg/m  09/30/21 35.89 kg/m  09/23/21 36.53 kg/m    Assessment/Interventions: Review of patient past medical history, allergies, medications, health status, including review of consultants reports, laboratory and other test data, was performed as part of comprehensive evaluation and provision of chronic care management services.   SDOH:  (Social Determinants of Health) assessments and interventions performed: Yes  Financial Resource Strain: Low Risk    Difficulty of Paying Living Expenses: Not hard at all    SDOH Screenings   Alcohol Screen: Low Risk    Last Alcohol Screening Score (AUDIT): 0  Depression (PHQ2-9): Low Risk    PHQ-2 Score: 2  Financial Resource Strain: Low Risk     Difficulty of Paying Living Expenses: Not hard at all  Food Insecurity: No Food Insecurity   Worried About Charity fundraiser in the Last Year: Never true   Ran Out of Food in the Last Year: Never true  Housing: Low Risk    Last Housing Risk Score: 0  Physical Activity: Inactive   Days of Exercise per Week: 0 days   Minutes of Exercise per Session: 0 min  Social Connections: Engineer, building services of Communication with Friends and Family: More than three times a week   Frequency of Social Gatherings with Friends and Family: More than three times a week   Attends Religious Services: More than 4 times per year   Active Member of [employer] or Organizations: Yes   Attends Music therapist: More than 4 times per year   Marital Status: Married  Stress: No Stress Concern Present   Feeling of Stress : Not at all  Tobacco Use: Medium Risk   Smoking Tobacco Use: Former   Smokeless Tobacco Use: Never   Passive Exposure: Never  Transportation Needs: No Data processing manager (Medical): No   Lack of Transportation (Non-Medical): No    CCM Care Plan  Allergies  Allergen Reactions   Latex Rash    And Mouth sores Other reaction(s): rash Other reaction(s): rash Other reaction(s): rash   Amlodipine Besy-Benazepril Hcl Other (See Comments)    COUGH   Amoxicillin-Pot Clavulanate Diarrhea and Nausea And Vomiting    Other reaction(s): sick on stomach Other reaction(s): sick on stomach   Bextra [Valdecoxib] Diarrhea and Nausea And Vomiting    REFLUX   Chlorhexidine     rash Other reaction(s): rash all over body Other reaction(s): rash all over body   Lipitor [Atorvastatin Calcium] Other (See Comments)    MYALGIAS   Sulfa Antibiotics Nausea And Vomiting    CRAMPING  Other reaction(s): stomach hurts Other reaction(s): stomach hurts   Zocor [Simvastatin] Other (See Comments)    MYALGIA    Medications Reviewed Today     Reviewed by Edythe Clarity, [facility] (Pharmacist) on 10/23/21 at 1615  Med List Status: <None>   Medication Order Taking? Sig Documenting Provider Last Dose Status Informant  buPROPion (WELLBUTRIN XL) 300 MG 24 hr tablet [id number] Yes Take 300 mg by mouth every morning. [provider] Taking Active            Med Note Jacobo Forest, Leilani Able   Wed Jan 15, 2021 10:23 AM) For depression  clonazePAM (KLONOPIN) 1 MG tablet [id number] Yes TAKE 1 TABLET BY MOUTH EVERY DAY AS NEEDED FOR ANXIETY  Patient taking differently: Take 1 mg by mouth daily as needed for anxiety.   Susy Frizzle, MD Taking Active            Med Note Caro Hight   Tue Nov 05, 2020  2:20 PM) Fill date 10/27/20 #30 today # 21 last taken last pm  dexlansoprazole (DEXILANT) 60 MG capsule [id number] Yes Take 1 capsule (60 mg total) by mouth daily. Susy Frizzle, MD Taking Active   doxazosin (CARDURA) 2 MG tablet [id number] Yes Take 1 tablet (2 mg total) by mouth daily. Susy Frizzle, MD Taking Active   doxepin [facility]) 50 MG capsule [id number] Yes Take 50 mg by mouth. [provider] Taking Active            Med Note Jacobo Forest, Leilani Able   Wed Jan 15, 2021 10:23 AM) Nightly for sleep  ezetimibe (ZETIA) 10 MG tablet [id number] Yes TAKE 1 TABLET BY MOUTH EVERY DAY Susy Frizzle, MD Taking Active   FLUoxetine (PROZAC) 20 MG capsule [id number] Yes Take 60 mg by mouth daily. [provider] Taking Active   FLUoxetine (PROZAC) 40 MG capsule [id number] Yes Take 40 mg by mouth daily. [provider] Taking Active   fluticasone (FLONASE) 50 MCG/ACT nasal spray [id number] Yes Place 2 sprays into both nostrils daily as needed for allergies. Susy Frizzle, MD Taking Active   hydrochlorothiazide (HYDRODIURIL) 25 MG tablet [id number] Yes Take 1 tablet (25 mg total) by mouth daily. Susy Frizzle, MD Taking Active   INGREZZA 80 MG capsule [id number] Yes Take one capsule by mouth daily Penumalli, Earlean Polka, MD Taking Active    leflunomide (ARAVA) 20 MG tablet [id number] Yes TAKE 1 TABLET BY MOUTH EVERY DAY Deveshwar, Abel Presto, MD Taking Active   metoprolol succinate (TOPROL-XL) 50 MG 24 hr tablet [id number] Yes TAKE 1 TABLET BY MOUTH ONCE DAILY FOLLOWING A MEAL Susy Frizzle, MD Taking Active   omeprazole (PRILOSEC) 40 MG capsule [id number] Yes TAKE 1 CAPSULE BY MOUTH EVERY MORNING AND AT BEDTIME Susy Frizzle, MD Taking Active   ondansetron (ZOFRAN) 4 MG tablet [id number] Yes Take 4 mg by mouth 4 (four) times daily as needed for nausea. [provider] Taking Active   oxyCODONE-acetaminophen (PERCOCET) 5-325 MG tablet [id number] Yes Take 1 tablet by mouth every 8 (eight) hours as needed for severe pain. Susy Frizzle, MD Taking Active   pregabalin (LYRICA) 75 MG capsule [id number] Yes Take 1 capsule (75 mg total) by mouth 2 (two) times daily. Susy Frizzle, MD Taking Active   tiZANidine (ZANAFLEX) 4 MG capsule [id number] Yes Take 1 capsule (4 mg total) by mouth 3 (three) times daily. Susy Frizzle, MD Taking Active   valsartan (DIOVAN) 320 MG  tablet [id number] Yes Take 1 tablet (320 mg total) by mouth daily. Susy Frizzle, MD Taking Active             Patient Active Problem List   Diagnosis Date Noted   Lesion of liver 06/20/2021   C. difficile colitis 09/29/2019   Colitis 09/28/2019   Anxiety and depression 09/28/2019   DDD (degenerative disc disease), cervical s/p fusion 11/12/2016   H/O total knee replacement, left 05/06/2016   DDD lumbar spine status post fusion 05/06/2016   Psoriasis 05/05/2016   High risk medication use 05/05/2016   Cervical post-laminectomy syndrome 12/09/2015   Thoracic postlaminectomy syndrome 12/09/2015   Psoriatic arthritis ([place]) 08/23/2012   Osteopenia 08/23/2012   HLD (hyperlipidemia) 08/23/2012   RLS (restless legs syndrome) 08/23/2012   Insomnia 08/23/2012   Fibromyalgia syndrome 08/12/2012   Low back pain 08/12/2012   Syncope    PVC (premature  ventricular contraction)    OCD (obsessive compulsive disorder)    GERD (gastroesophageal reflux disease)    Hypertension     Immunization History  Administered Date(s) Administered   Fluad Quad(high Dose 65+) 01/26/2019, 03/05/2020   Influenza Whole 03/04/2012   Influenza, High Dose Seasonal PF 03/25/2018   Influenza,inj,Quad PF,6+ Mos 02/28/2013, 03/07/2014, 02/28/2015, 03/27/2016, 03/09/2017   Influenza-Unspecified 04/08/2021   Pneumococcal Conjugate-13 09/10/2016   Pneumococcal Polysaccharide-23 03/12/2006, 11/09/2011   Pneumococcal-Unspecified 08/31/2016   Unspecified SARS-COV-2 Vaccination 08/07/2019, 08/28/2019, 03/08/2020    Conditions to be addressed/monitored:  HTN, PVC, GERD, Osteopenia, Fibromyalgia, HLD, RLS, Depression/Anxiety  Care Plan : General Pharmacy (Adult)  Updates made by Edythe Clarity, RPH since 10/23/2021 12:00 AM     Problem: HTN, PVC, GERD, Osteopenia, Fibromyalgia, HLD, RLS, Depression/Anxiety   Priority: High  Onset Date: 04/10/2021     Long-Range Goal: Patient-Specific Goal   Start Date: 04/10/2021  Expected End Date: 10/08/2021  Recent Progress: On track  Priority: High  Note:   Current Barriers:  Elevated LDL - no recent labs Fibromyalgia pain  Pharmacist Clinical Goal(s):  Patient will achieve improvement in LDL/pain as evidenced by labs and symotoms through collaboration with PharmD and provider.   Interventions: 1:1 collaboration with Susy Frizzle, MD regarding development and update of comprehensive plan of care as evidenced by provider attestation and co-signature Inter-disciplinary care team collaboration (see longitudinal plan of care) Comprehensive medication review performed; medication list updated in electronic medical record  Hypertension/PVC (BP goal <140/90) -Controlled -Current treatment: HCTZ 40m daily Appropriate, Effective, Query Safe,  Toprol XL 574mdaily Appropriate, Effective, Safe,  Accessible Valsartan 32055maily Appropriate, Effective, Safe, Accessible Doxazosin 2mg52mpropriate, Query effective, ,  -Medications previously tried: none noted  -Current home readings: not checking at home -Current dietary habits: eats two meals per day usually -Current exercise habits: minimal "walk from car to restaurant" -Denies hypotensive/hypertensive symptoms -Educated on BP goals and benefits of medications for prevention of heart attack, stroke and kidney damage; Importance of home blood pressure monitoring; Symptoms of hypotension and importance of maintaining adequate hydration; -Counseled to monitor BP at home periodically, document, and provide log at future appointments -Recommended to continue current medication  Update 10/23/21 BP had been elevated a little recently so she was started on doxazosin.  She is not monitoring BP at home currently but does have a monitor.  Denies headache or dizziness. Exercise is unchanged.  Currently monitoring her sodium and has stopped HCTZ during the process. Will FU on BMP tomorrow. Would prefer her to be able to start back on  HCTZ due to elevated BP. No changes at this time, adjust based off BMP.   Hyperlipidemia: (LDL goal < 100) -Not ideally controlled -Current treatment: Zetia 71m daily Appropriate, Query effective, ,  -Medications previously tried: atorvastatin (myalgias)  -Educated on Cholesterol goals;  Benefits of statin for ASCVD risk reduction; Most recent lipids from 2018.  Patient has intolerance to statins. Recommend recheck lipid panel to see if zetia is controlling LDL. -Recommended updated lipid panel first of the year.  Patient needs physical with PCP.  Could consider Repatha/Praluent if LDL is elevated.  Update 10/23/21 LDL was slightly elevated at last lipid panel, however, she was not fasting per her reports. Inaccurate reading, would recommend recheck at next OV for physical. Has previous insensitivity to  statins due to myalgias. Would think that LDL would be < 100 if she fasted. No changes at this time - continue to monitor.  Osteopenia (Goal Maintain bone density/prevent fractures) -Controlled -Last DEXA Scan: 04/02/21   T-Score femoral neck: -1.3 -Patient is not a candidate for pharmacologic treatment -Current treatment  None at this time -Medications previously tried: none noted  -Recommend weight-bearing and muscle strengthening exercises for building and maintaining bone density. -Recommended continue current management, repeat DEXA in two years.  Fibromyalgia (Goal: Manage pain) -Not ideally controlled -Current treatment  Oxycodone/APAP 5-3255mhs prn -Medications previously tried: gabapentin per patient - no effect -Pain on daily basis, she tries to take med once daily, however she does occasionally have to take one during the day.  Counseled on managing pain -Recommended to continue current medication  Patient Goals/Self-Care Activities Patient will:  - take medications as prescribed as evidenced by patient report and record review focus on medication adherence by pill box check blood pressure periodically, document, and provide at future appointments  Follow Up Plan: The care management team will reach out to the patient again over the next 180 days.             Medication Assistance: None required.  Patient affirms current coverage meets needs.  Compliance/Adherence/Medication fill history: Care Gaps: Pneumonia vaccine  Star-Rating Drugs: 03/11/21 Valsartan 32083m0ds  Patient's [facility]:  [id number]: [phone]: [phone] [address] [address] [id number]: [phone]: [phone] pill box?  Yes Pt endorses 100% compliance  We discussed: Benefits of medication synchronization, packaging and delivery as well as enhanced pharmacist oversight with [facility]. Patient decided to: Utilize [facility] pharmacy for medication synchronization, packaging and delivery  Care Plan and Follow Up Patient Decision:  Patient agrees to Care Plan and Follow-up.  Plan: The care management team will reach out to the patient again over the next 180 days.  ChriBeverly MilcharmD, CPP Clinical Pharmacist Practitioner [phone]

## 2021-10-23 NOTE — Patient Instructions (Addendum)
Visit Information   Goals Addressed             This Visit's Progress    Track and Manage My Blood Pressure-Hypertension   On track    Timeframe:  Long-Range Goal Priority:  High Start Date: 04/10/21                            Expected End Date:  10/08/21                     Follow Up Date 07/11/21    - check blood pressure weekly - choose a place to take my blood pressure (home, clinic or office, retail store) - write blood pressure results in a log or diary    Why is this important?   You won't feel high blood pressure, but it can still hurt your blood vessels.  High blood pressure can cause heart or kidney problems. It can also cause a stroke.  Making lifestyle changes like losing a little weight or eating less salt will help.  Checking your blood pressure at home and at different times of the day can help to control blood pressure.  If the doctor prescribes medicine remember to take it the way the doctor ordered.  Call the office if you cannot afford the medicine or if there are questions about it.     Notes:        Patient Care Plan: General Pharmacy (Adult)     Problem Identified: HTN, PVC, GERD, Osteopenia, Fibromyalgia, HLD, RLS, Depression/Anxiety   Priority: High  Onset Date: 04/10/2021     Long-Range Goal: Patient-Specific Goal   Start Date: 04/10/2021  Expected End Date: 10/08/2021  Recent Progress: On track  Priority: High  Note:   Current Barriers:  Elevated LDL - no recent labs Fibromyalgia pain  Pharmacist Clinical Goal(s):  Patient will achieve improvement in LDL/pain as evidenced by labs and symotoms through collaboration with PharmD and provider.   Interventions: 1:1 collaboration with Susy Frizzle, MD regarding development and update of comprehensive plan of care as evidenced by provider attestation and co-signature Inter-disciplinary care team collaboration (see longitudinal plan of care) Comprehensive medication review performed;  medication list updated in electronic medical record  Hypertension/PVC (BP goal <140/90) -Controlled -Current treatment: HCTZ '25mg'$  daily Appropriate, Effective, Query Safe,  Toprol XL '50mg'$  daily Appropriate, Effective, Safe, Accessible Valsartan '320mg'$  daily Appropriate, Effective, Safe, Accessible Doxazosin '2mg'$  Appropriate, Query effective, ,  -Medications previously tried: none noted  -Current home readings: not checking at home -Current dietary habits: eats two meals per day usually -Current exercise habits: minimal "walk from car to restaurant" -Denies hypotensive/hypertensive symptoms -Educated on BP goals and benefits of medications for prevention of heart attack, stroke and kidney damage; Importance of home blood pressure monitoring; Symptoms of hypotension and importance of maintaining adequate hydration; -Counseled to monitor BP at home periodically, document, and provide log at future appointments -Recommended to continue current medication  Update 10/23/21 BP had been elevated a little recently so she was started on doxazosin.  She is not monitoring BP at home currently but does have a monitor.  Denies headache or dizziness. Exercise is unchanged.  Currently monitoring her sodium and has stopped HCTZ during the process. Will FU on BMP tomorrow. Would prefer her to be able to start back on HCTZ due to elevated BP. No changes at this time, adjust based off BMP.   Hyperlipidemia: (LDL goal <  100) -Not ideally controlled -Current treatment: Zetia '10mg'$  daily Appropriate, Query effective, ,  -Medications previously tried: atorvastatin (myalgias)  -Educated on Cholesterol goals;  Benefits of statin for ASCVD risk reduction; Most recent lipids from 2018.  Patient has intolerance to statins. Recommend recheck lipid panel to see if zetia is controlling LDL. -Recommended updated lipid panel first of the year.  Patient needs physical with PCP.  Could consider Repatha/Praluent if  LDL is elevated.  Update 10/23/21 LDL was slightly elevated at last lipid panel, however, she was not fasting per her reports. Inaccurate reading, would recommend recheck at next OV for physical. Has previous insensitivity to statins due to myalgias. Would think that LDL would be < 100 if she fasted. No changes at this time - continue to monitor.  Osteopenia (Goal Maintain bone density/prevent fractures) -Controlled -Last DEXA Scan: 04/02/21   T-Score femoral neck: -1.3 -Patient is not a candidate for pharmacologic treatment -Current treatment  None at this time -Medications previously tried: none noted  -Recommend weight-bearing and muscle strengthening exercises for building and maintaining bone density. -Recommended continue current management, repeat DEXA in two years.  Fibromyalgia (Goal: Manage pain) -Not ideally controlled -Current treatment  Oxycodone/APAP 5-'325mg'$  hs prn -Medications previously tried: gabapentin per patient - no effect -Pain on daily basis, she tries to take med once daily, however she does occasionally have to take one during the day.  Counseled on managing pain -Recommended to continue current medication  Patient Goals/Self-Care Activities Patient will:  - take medications as prescribed as evidenced by patient report and record review focus on medication adherence by pill box check blood pressure periodically, document, and provide at future appointments  Follow Up Plan: The care management team will reach out to the patient again over the next 180 days.            The patient verbalized understanding of instructions, educational materials, and care plan provided today and DECLINED offer to receive copy of patient instructions, educational materials, and care plan.  Telephone follow up appointment with pharmacy team member scheduled for: 6 months  Edythe Clarity, [place], PharmD, [place] Clinical Pharmacist Practitioner [place] [phone]

## 2021-10-24 LAB — BASIC METABOLIC PANEL
BUN: 7 mg/dL (ref 7–25)
CO2: 26 mmol/L (ref 20–32)
Calcium: 9.7 mg/dL (ref 8.6–10.4)
Chloride: 102 mmol/L (ref 98–110)
Creat: 0.99 mg/dL (ref 0.60–1.00)
Glucose, Bld: 95 mg/dL (ref 65–99)
Potassium: 4.1 mmol/L (ref 3.5–5.3)
Sodium: 136 mmol/L (ref 135–146)

## 2021-11-06 ENCOUNTER — Other Ambulatory Visit: Payer: Self-pay | Admitting: Family Medicine

## 2021-11-07 NOTE — Telephone Encounter (Signed)
Requested medication (s) are due for refill today: Yes  Requested medication (s) are on the active medication list: Yes  Last refill:  10/13/21  Future visit scheduled: No  Notes to clinic:  Not delegated.    Requested Prescriptions  Pending Prescriptions Disp Refills   pregabalin (LYRICA) 75 MG capsule [Pharmacy Med Name: PREGABALIN 75 MG CAPSULE] 60 capsule 0    Sig: TAKE 1 CAPSULE BY MOUTH TWICE A DAY     Not Delegated - Neurology:  Anticonvulsants - Controlled - pregabalin Failed - 11/07/2021  8:05 AM      Failed - This refill cannot be delegated      Passed - Cr in normal range and within 360 days    Creat  Date Value Ref Range Status  10/23/2021 0.99 0.60 - 1.00 mg/dL Final         Passed - Completed PHQ-2 or PHQ-9 in the last 360 days      Passed - Valid encounter within last 12 months    Recent Outpatient Visits           3 weeks ago Hyponatremia   [place] Pickard, Cammie Mcgee, MD   3 months ago [place] Dennard Schaumann, Cammie Mcgee, MD   3 months ago [place] Dennard Schaumann, Cammie Mcgee, MD   4 months ago Anemia, unspecified type   [place] Susy Frizzle, MD   5 months ago Acute non-recurrent pansinusitis   [place] Eulogio Bear, NP       Future Appointments             In 2 months [place] [facility] [place] [facility]            Signed Prescriptions Disp Refills   doxazosin (CARDURA) 2 MG tablet 90 tablet 1    Sig: TAKE 1 TABLET BY MOUTH EVERY DAY     Cardiovascular:  Alpha Blockers Failed - 11/07/2021  8:05 AM      Failed - Last BP in normal range    BP Readings from Last 1 Encounters:  10/13/21 (!) 152/92         Passed - Valid encounter within last 6 months    Recent Outpatient Visits           3 weeks ago Hyponatremia   [place] Dennard Schaumann, Cammie Mcgee, MD   3 months ago [place] Dennard Schaumann, Cammie Mcgee, MD   3 months ago [place] Dennard Schaumann, Cammie Mcgee, MD   4 months ago Anemia, unspecified type   [place] Susy Frizzle, MD   5 months ago Acute non-recurrent pansinusitis   [place] Eulogio Bear, NP       Future Appointments             In 2 months [place] [facility] [facility]

## 2021-11-07 NOTE — Telephone Encounter (Signed)
Requested Prescriptions  Pending Prescriptions Disp Refills  . pregabalin (LYRICA) 75 MG capsule [Pharmacy Med Name: PREGABALIN 75 MG CAPSULE] 60 capsule 0    Sig: TAKE 1 CAPSULE BY MOUTH TWICE A DAY     Not Delegated - Neurology:  Anticonvulsants - Controlled - pregabalin Failed - 11/07/2021  8:05 AM      Failed - This refill cannot be delegated      Passed - Cr in normal range and within 360 days    Creat  Date Value Ref Range Status  10/23/2021 0.99 0.60 - 1.00 mg/dL Final         Passed - Completed PHQ-2 or PHQ-9 in the last 360 days      Passed - Valid encounter within last 12 months    Recent Outpatient Visits          3 weeks ago Hyponatremia   [place] Dennard Schaumann, Cammie Mcgee, MD   3 months ago [place] Dennard Schaumann, Cammie Mcgee, MD   3 months ago [place] Dennard Schaumann, Cammie Mcgee, MD   4 months ago Anemia, unspecified type   [place] Susy Frizzle, MD   5 months ago Acute non-recurrent pansinusitis   [place] Eulogio Bear, NP      Future Appointments            In 2 months [place] [facility] [place] [facility]           . doxazosin (CARDURA) 2 MG tablet [Pharmacy Med Name: DOXAZOSIN MESYLATE 2 MG TAB] 90 tablet 1    Sig: TAKE 1 TABLET BY MOUTH EVERY DAY     Cardiovascular:  Alpha Blockers Failed - 11/07/2021  8:05 AM      Failed - Last BP in normal range    BP Readings from Last 1 Encounters:  10/13/21 (!) 152/92         Passed - Valid encounter within last 6 months    Recent Outpatient Visits          3 weeks ago Hyponatremia   [place] Dennard Schaumann, Cammie Mcgee, MD   3 months ago [place] Dennard Schaumann, Cammie Mcgee, MD   3 months ago [place] Dennard Schaumann, Cammie Mcgee, MD   4 months ago Anemia, unspecified type   [place] Susy Frizzle, MD    5 months ago Acute non-recurrent pansinusitis   [place] Eulogio Bear, NP      Future Appointments            In 2 months [place] [facility] [facility]

## 2021-11-07 NOTE — Telephone Encounter (Signed)
Requested medication (s) are due for refill today: Yes  Requested medication (s) are on the active medication list: Yes  Last refill:  10/13/21  Future visit scheduled: No  Notes to clinic:  Not delegated.    Requested Prescriptions  Pending Prescriptions Disp Refills   pregabalin (LYRICA) 75 MG capsule [Pharmacy Med Name: PREGABALIN 75 MG CAPSULE] 60 capsule 0    Sig: TAKE 1 CAPSULE BY MOUTH TWICE A DAY     Not Delegated - Neurology:  Anticonvulsants - Controlled - pregabalin Failed - 11/07/2021  8:05 AM      Failed - This refill cannot be delegated      Passed - Cr in normal range and within 360 days    Creat  Date Value Ref Range Status  10/23/2021 0.99 0.60 - 1.00 mg/dL Final         Passed - Completed PHQ-2 or PHQ-9 in the last 360 days      Passed - Valid encounter within last 12 months    Recent Outpatient Visits           3 weeks ago Hyponatremia   [place] Pickard, Cammie Mcgee, MD   3 months ago [place] Dennard Schaumann, Cammie Mcgee, MD   3 months ago [place] Dennard Schaumann, Cammie Mcgee, MD   4 months ago Anemia, unspecified type   [place] Susy Frizzle, MD   5 months ago Acute non-recurrent pansinusitis   [place] Eulogio Bear, NP       Future Appointments             In 2 months [place] [facility]            Signed Prescriptions Disp Refills   doxazosin (CARDURA) 2 MG tablet 90 tablet 1    Sig: TAKE 1 TABLET BY MOUTH EVERY DAY     Cardiovascular:  Alpha Blockers Failed - 11/07/2021  8:05 AM      Failed - Last BP in normal range    BP Readings from Last 1 Encounters:  10/13/21 (!) 152/92         Passed - Valid encounter within last 6 months    Recent Outpatient Visits           3 weeks ago Hyponatremia   [place] Dennard Schaumann, Cammie Mcgee, MD   3 months ago [place] Dennard Schaumann, Cammie Mcgee, MD   3 months ago [place] Dennard Schaumann, Cammie Mcgee, MD   4 months ago Anemia, unspecified type   [place] Susy Frizzle, MD   5 months ago Acute non-recurrent pansinusitis   [place] Eulogio Bear, NP       Future Appointments             In 2 months [place] [facility]            Yes

## 2021-11-10 ENCOUNTER — Other Ambulatory Visit: Payer: Self-pay | Admitting: Diagnostic Neuroimaging

## 2021-11-10 ENCOUNTER — Ambulatory Visit (INDEPENDENT_AMBULATORY_CARE_PROVIDER_SITE_OTHER): Payer: Medicare Other | Admitting: Physician Assistant

## 2021-11-10 DIAGNOSIS — M1711 Unilateral primary osteoarthritis, right knee: Secondary | ICD-10-CM | POA: Diagnosis not present

## 2021-11-10 MED ORDER — HYLAN G-F 20 16 MG/2ML IX SOSY
16.0000 mg | PREFILLED_SYRINGE | INTRA_ARTICULAR | Status: AC | PRN
  Administered 2021-11-10: 16 mg via INTRA_ARTICULAR

## 2021-11-10 MED ORDER — LIDOCAINE HCL 1 % IJ SOLN
1.5000 mL | INTRAMUSCULAR | Status: AC | PRN
  Administered 2021-11-10: 1.5 mL

## 2021-11-10 NOTE — Telephone Encounter (Signed)
LOV 10/13/21 Last refill 10/13/21, #60, 0 refills  Please review, thanks!

## 2021-11-10 NOTE — Progress Notes (Signed)
   Procedure Note  Patient: Janet Peterson             Date of Birth: 17-Aug-1950           MRN: [id number]             Visit Date: 11/10/2021  Procedures: Visit Diagnoses:  1. Primary osteoarthritis of right knee    Synvisc #1 Right knee joint injection  Large Joint Inj: R knee on 11/10/2021 1:31 PM Indications: pain Details: 25 G 1.5 in needle, medial approach  Arthrogram: No  Medications: 1.5 mL lidocaine 1 %; 16 mg hylan 16 MG/2ML Aspirate: 0 mL Outcome: tolerated well, no immediate complications Procedure, treatment alternatives, risks and benefits explained, specific risks discussed. Consent was given by the patient. Immediately prior to procedure a time out was called to verify the correct patient, procedure, equipment, support staff and site/side marked as required. Patient was prepped and draped in the usual sterile fashion.     Patient tolerated the procedure well.  Aftercare was discussed.  Hazel Sams, PA-C

## 2021-11-13 ENCOUNTER — Encounter: Payer: Self-pay | Admitting: Physical Medicine & Rehabilitation

## 2021-11-13 ENCOUNTER — Encounter: Payer: Medicare Other | Attending: Physical Medicine & Rehabilitation | Admitting: Physical Medicine & Rehabilitation

## 2021-11-13 ENCOUNTER — Other Ambulatory Visit: Payer: Self-pay | Admitting: Family Medicine

## 2021-11-13 VITALS — BP 145/88 | HR 80 | Ht 63.0 in | Wt 200.8 lb

## 2021-11-13 DIAGNOSIS — M797 Fibromyalgia: Secondary | ICD-10-CM | POA: Diagnosis not present

## 2021-11-13 DIAGNOSIS — M542 Cervicalgia: Secondary | ICD-10-CM | POA: Diagnosis not present

## 2021-11-13 DIAGNOSIS — M961 Postlaminectomy syndrome, not elsewhere classified: Secondary | ICD-10-CM | POA: Diagnosis present

## 2021-11-13 DIAGNOSIS — G243 Spasmodic torticollis: Secondary | ICD-10-CM | POA: Diagnosis not present

## 2021-11-13 NOTE — Progress Notes (Signed)
Acupuncture treatment #4 Indication cervical dystonia with cervicalgia  40 mm acupuncture needles placed bilaterally at points BL 12 BL 13 and BL 14 with electrical stimulation at each level 2 Hz x 25 minutes.  Patient tolerated procedure well  We will see patient in 4 to 6 weeks for follow-up.  We will decide whether to continue with acupuncture treatment or pursue other treatment options at that time.

## 2021-11-13 NOTE — Patient Instructions (Signed)
Acupuncture Acupuncture is a type of treatment that involves stimulating specific points on your body by inserting thin needles through your skin. Acupuncture is often used to treat pain, but it may also be used to help relieve other types of symptoms. Your health care provider may recommend acupuncture to help treat various conditions, such as: Migraine and tension headaches. Nausea and vomiting after a surgery or cancer treatment. Sudden or severe (acute) pain, or long-term (chronic) pain. Addiction. Acupuncture is based on traditional Chinese medicine, which recognizes more than 2,000 points on the body that connect energy pathways (meridians) through the body. The goal in stimulating these points is to balance the physical, emotional, and mental energy in your body. Acupuncture is done by a health care provider who has specialized training (licensed acupuncture practitioner). Treatment often requires several acupuncture sessions. You may have acupuncture along with other medical treatments. Tell a health care provider about: Any allergies you have. All medicines you are taking, including vitamins, herbs, eye drops, creams, and over-the-counter medicines. Any blood disorders you have. Any surgeries you have had. Any medical conditions you have. Whether you are pregnant or may be pregnant. What are the risks? Generally, this is a safe treatment. However, problems may occur, including: Skin infection. Damage to organs or structures that are under the skin if a needle is placed too deeply. This is rare. What happens before the treatment? Your acupuncture practitioner will ask about your medical history and your symptoms. You may have a physical exam. What happens during the treatment? The exact procedure will depend on your condition and how your acupuncture provider treats it. In general: Your skin will be cleaned with a germ-killing (antiseptic) solution. Your acupuncture practitioner will  open a new set of germ-free (sterile) needles. The needles will be gently inserted into your skin. They will be left in place for a certain amount of time. You may feel a tingling or burning sensation for a very short period of time. Your acupuncture practitioner may: Apply electrical energy to the needles. Adjust the needles in certain ways. After your procedure, the acupuncture practitioner will remove the needles, throw them away, and clean your skin. The procedure may vary among health care providers. What can I expect after the treatment? People react differently to acupuncture. Make sure you ask your acupuncture provider what to expect after your treatment. It is common to have: Minor bruising. Mild pain. A small amount of bleeding. Follow these instructions at home: Follow any instructions given by your provider after the treatment. Keep all follow-up visits. This is important. Where to find more information National Center for Complementary and Integrative Health: www.nccih.nih.gov Contact a health care provider if: You have questions about your reaction to the treatment. You have soreness. You have skin irritation or redness. You have a fever. Summary Acupuncture is a type of treatment that involves stimulating specific points on your body by inserting thin needles through your skin. This treatment is often used to treat pain, but it may also be used to help relieve other types of symptoms. The exact procedure will depend on your condition and how your acupuncture provider treats it. This information is not intended to replace advice given to you by your health care provider. Make sure you discuss any questions you have with your health care provider. Document Revised: 01/15/2021 Document Reviewed: 01/15/2021 Elsevier Patient Education  2023 Elsevier Inc.  

## 2021-11-13 NOTE — Telephone Encounter (Signed)
Requested medication (s) are due for refill today: Yes  Requested medication (s) are on the active medication list: Yes  Last refill:  10/13/21  Future visit scheduled: No  Notes to clinic:  See request.    Requested Prescriptions  Pending Prescriptions Disp Refills   pregabalin (LYRICA) 75 MG capsule [Pharmacy Med Name: PREGABALIN 75 MG CAPSULE] 60 capsule 0    Sig: TAKE 1 CAPSULE BY MOUTH TWICE A DAY     Not Delegated - Neurology:  Anticonvulsants - Controlled - pregabalin Failed - 11/13/2021  1:50 PM      Failed - This refill cannot be delegated      Passed - Cr in normal range and within 360 days    Creat  Date Value Ref Range Status  10/23/2021 0.99 0.60 - 1.00 mg/dL Final         Passed - Completed PHQ-2 or PHQ-9 in the last 360 days      Passed - Valid encounter within last 12 months    Recent Outpatient Visits           1 month ago Hyponatremia   [place], Warren T, MD   3 months ago [place] Dennard Schaumann, Cammie Mcgee, MD   3 months ago [place] Dennard Schaumann, Cammie Mcgee, MD   4 months ago Anemia, unspecified type   [place], Warren T, MD   5 months ago Acute non-recurrent pansinusitis   [place] Eulogio Bear, NP       Future Appointments             In 1 month [place] [facility]

## 2021-11-17 ENCOUNTER — Encounter: Payer: Self-pay | Admitting: Physician Assistant

## 2021-11-17 ENCOUNTER — Ambulatory Visit: Payer: Medicare Other | Admitting: Physician Assistant

## 2021-11-17 VITALS — BP 152/86 | HR 82

## 2021-11-17 DIAGNOSIS — M1711 Unilateral primary osteoarthritis, right knee: Secondary | ICD-10-CM | POA: Diagnosis not present

## 2021-11-17 MED ORDER — LIDOCAINE HCL 1 % IJ SOLN
1.5000 mL | INTRAMUSCULAR | Status: AC | PRN
  Administered 2021-11-17: 1.5 mL

## 2021-11-17 MED ORDER — HYLAN G-F 20 16 MG/2ML IX SOSY
16.0000 mg | PREFILLED_SYRINGE | INTRA_ARTICULAR | Status: AC | PRN
  Administered 2021-11-17: 16 mg via INTRA_ARTICULAR

## 2021-11-17 NOTE — Progress Notes (Signed)
   Procedure Note  Patient: Janet Peterson             Date of Birth: 06-26-1950           MRN: [id number]             Visit Date: 11/17/2021  Procedures: Visit Diagnoses:  1. Primary osteoarthritis of right knee    Synvisc #2 Right knee joint injection Large Joint Inj: R knee on 11/17/2021 2:06 PM Indications: pain Details: 25 G 1.5 in needle, medial approach  Arthrogram: No  Medications: 1.5 mL lidocaine 1 %; 16 mg hylan 16 MG/2ML Aspirate: 0 mL Outcome: tolerated well, no immediate complications Procedure, treatment alternatives, risks and benefits explained, specific risks discussed. Consent was given by the patient. Immediately prior to procedure a time out was called to verify the correct patient, procedure, equipment, support staff and site/side marked as required. Patient was prepped and draped in the usual sterile fashion.     Patient tolerated the procedure well.  Aftercare was discussed.  Sherron Ales, PA-C

## 2021-11-19 ENCOUNTER — Other Ambulatory Visit: Payer: Self-pay

## 2021-11-19 NOTE — Telephone Encounter (Signed)
LOV 10/13/21 Last refill 10/16/21, #90, 0 refills  Please review, thanks!

## 2021-11-20 ENCOUNTER — Other Ambulatory Visit: Payer: Self-pay | Admitting: Family Medicine

## 2021-11-20 MED ORDER — OXYCODONE-ACETAMINOPHEN 5-325 MG PO TABS: 1.0000 | ORAL_TABLET | Freq: Three times a day (TID) | ORAL | 0 refills | Status: DC | PRN

## 2021-11-20 NOTE — Telephone Encounter (Signed)
I'm sorry, I thought I linked the med. Thank you for letting me, I'll double check next time.     LOV 10/13/21 Last refill 10/16/21, #90, 0 refills   Please review, thanks!

## 2021-11-26 ENCOUNTER — Ambulatory Visit: Payer: Medicare Other | Admitting: Physician Assistant

## 2021-11-26 DIAGNOSIS — M1711 Unilateral primary osteoarthritis, right knee: Secondary | ICD-10-CM

## 2021-11-26 MED ORDER — LIDOCAINE HCL 1 % IJ SOLN
1.5000 mL | INTRAMUSCULAR | Status: AC | PRN
  Administered 2021-11-26: 1.5 mL

## 2021-11-26 MED ORDER — HYLAN G-F 20 16 MG/2ML IX SOSY
16.0000 mg | PREFILLED_SYRINGE | INTRA_ARTICULAR | Status: AC | PRN
  Administered 2021-11-26: 16 mg via INTRA_ARTICULAR

## 2021-11-26 NOTE — Progress Notes (Signed)
   Procedure Note  Patient: Janet Peterson             Date of Birth: 06/12/1950           MRN: [id number]             Visit Date: 11/26/2021  Procedures: Visit Diagnoses:  1. Primary osteoarthritis of right knee    Synvisc #3 right knee, B/B Large Joint Inj: R knee on 11/26/2021 1:13 PM Indications: pain Details: 25 G 1.5 in needle, medial approach  Arthrogram: No  Medications: 1.5 mL lidocaine 1 %; 16 mg hylan 16 MG/2ML Aspirate: 0 mL Outcome: tolerated well, no immediate complications Procedure, treatment alternatives, risks and benefits explained, specific risks discussed. Consent was given by the patient. Immediately prior to procedure a time out was called to verify the correct patient, procedure, equipment, support staff and site/side marked as required. Patient was prepped and draped in the usual sterile fashion.      Patient tolerated the procedure well.  Aftercare was discussed.  Hazel Sams, PA-C

## 2021-11-28 ENCOUNTER — Other Ambulatory Visit: Payer: Self-pay | Admitting: Family Medicine

## 2021-11-28 ENCOUNTER — Other Ambulatory Visit: Payer: Self-pay | Admitting: Nurse Practitioner

## 2021-11-28 DIAGNOSIS — R21 Rash and other nonspecific skin eruption: Secondary | ICD-10-CM

## 2021-11-28 NOTE — Telephone Encounter (Signed)
Requested Prescriptions  Pending Prescriptions Disp Refills  . metoprolol succinate (TOPROL-XL) 50 MG 24 hr tablet [Pharmacy Med Name: METOPROLOL SUCC ER 50 MG TAB] 90 tablet 1    Sig: TAKE 1 TABLET BY MOUTH ONCE DAILY FOLLOWING A MEAL     Cardiovascular:  Beta Blockers Failed - 11/28/2021  5:18 PM      Failed - Last BP in normal range    BP Readings from Last 1 Encounters:  11/17/21 (!) 152/86         Passed - Last Heart Rate in normal range    Pulse Readings from Last 1 Encounters:  11/17/21 82         Passed - Valid encounter within last 6 months    Recent Outpatient Visits          1 month ago Hyponatremia   [place] Susy Frizzle, MD   3 months ago [place] Dennard Schaumann, Cammie Mcgee, MD   4 months ago [place] Dennard Schaumann, Cammie Mcgee, MD   5 months ago Anemia, unspecified type   [place] Susy Frizzle, MD   6 months ago Acute non-recurrent pansinusitis   [place] Eulogio Bear, NP      Future Appointments            In 1 week Pickard, Cammie Mcgee, MD [place], [place]   In 1 month [place] [facility] [facility]           . dexlansoprazole (DEXILANT) 60 MG capsule [Pharmacy Med Name: DEXLANSOPRAZOLE DR 60 MG CAP] 90 capsule 1    Sig: TAKE 1 CAPSULE BY MOUTH EVERY DAY     Gastroenterology: Proton Pump Inhibitors Passed - 11/28/2021  5:18 PM      Passed - Valid encounter within last 12 months    Recent Outpatient Visits          1 month ago Hyponatremia   [place] Dennard Schaumann, Cammie Mcgee, MD   3 months ago [place] Dennard Schaumann, Cammie Mcgee, MD   4 months ago [place] Dennard Schaumann, Cammie Mcgee, MD   5 months ago Anemia, unspecified type   [place] Susy Frizzle, MD   6 months ago Acute non-recurrent pansinusitis   [place] Eulogio Bear, NP      Future Appointments            In 1 week Pickard, Cammie Mcgee, MD [place], [place]   In 1 month [place] [facility] [place] [facility]umat[facility]

## 2021-11-28 NOTE — Telephone Encounter (Signed)
Requested medications are due for refill today.  Unsure  Requested medications are on the active medications list.  no  Last refill. 04/14/2021 15g  Future visit scheduled.   yes  Notes to clinic.  Medication was discontinued 09/23/2021 Medication not assigned a protocol.    Requested Prescriptions  Pending Prescriptions Disp Refills   mometasone (ELOCON) 0.1 % cream [Pharmacy Med Name: MOMETASONE FUROATE 0.1% CREAM] 15 g 0    Sig: Apply topically daily. Apply sparingly to affected area daily as needed.     Off-Protocol Failed - 11/28/2021  5:18 PM      Failed - Medication not assigned to a protocol, review manually.      Passed - Valid encounter within last 12 months    Recent Outpatient Visits           1 month ago Hyponatremia   [place] Pickard, Cammie Mcgee, MD   3 months ago [place] Dennard Schaumann, Cammie Mcgee, MD   4 months ago [place] Dennard Schaumann, Cammie Mcgee, MD   5 months ago Anemia, unspecified type   [place] Susy Frizzle, MD   6 months ago Acute non-recurrent pansinusitis   [place] Eulogio Bear, NP       Future Appointments             In 1 week Pickard, Cammie Mcgee, MD [place], [place]   In 1 month [place] [facility] [facility]

## 2021-12-05 ENCOUNTER — Telehealth: Payer: Self-pay | Admitting: Pharmacist

## 2021-12-05 NOTE — Progress Notes (Addendum)
Chronic Care Management Pharmacy Assistant   Name: Janet Peterson  MRN: [id number] DOB: 06-01-1950  Reason for Encounter: Hypertension Adherence Call    Recent office visits:  None  Recent consult visits:  11/26/2021 PV (Rheumatology) Ofilia Neas, PA-C; no medication changes indicated.  11/13/2021 OV (Phys Med) Charlett Blake, MD; no medication changes indicated.  11/26/2021 PV (Rheumatology) Ofilia Neas, PA-C; no medication changes indicated.  Hospital visits:  None in previous 6 months  Medications: Outpatient Encounter Medications as of 12/05/2021  Medication Sig Note   buPROPion (WELLBUTRIN XL) 300 MG 24 hr tablet Take 300 mg by mouth every morning. 01/15/2021: For depression   clonazePAM (KLONOPIN) 1 MG tablet TAKE 1 TABLET BY MOUTH EVERY DAY AS NEEDED FOR ANXIETY (Patient taking differently: Take 1 mg by mouth daily as needed for anxiety.) 11/05/2020: Fill date 10/27/20 #30 today # 21 last taken last pm   dexlansoprazole (DEXILANT) 60 MG capsule Take 1 capsule (60 mg total) by mouth daily.    doxazosin (CARDURA) 2 MG tablet TAKE 1 TABLET BY MOUTH EVERY DAY    doxepin (SINEQUAN) 50 MG capsule Take 50 mg by mouth. 01/15/2021: Nightly for sleep   ezetimibe (ZETIA) 10 MG tablet TAKE 1 TABLET BY MOUTH EVERY DAY    FLUoxetine (PROZAC) 20 MG capsule Take 60 mg by mouth daily.    FLUoxetine (PROZAC) 40 MG capsule Take 40 mg by mouth daily.    fluticasone (FLONASE) 50 MCG/ACT nasal spray Place 2 sprays into both nostrils daily as needed for allergies.    hydrochlorothiazide (HYDRODIURIL) 25 MG tablet Take 1 tablet (25 mg total) by mouth daily.    INGREZZA 80 MG capsule Take one capsule by mouth daily    leflunomide (ARAVA) 20 MG tablet TAKE 1 TABLET BY MOUTH EVERY DAY    metoprolol succinate (TOPROL-XL) 50 MG 24 hr tablet TAKE 1 TABLET BY MOUTH ONCE DAILY FOLLOWING A MEAL    omeprazole (PRILOSEC) 40 MG capsule TAKE 1 CAPSULE BY MOUTH EVERY MORNING AND AT BEDTIME     ondansetron (ZOFRAN) 4 MG tablet Take 4 mg by mouth 4 (four) times daily as needed for nausea.    oxyCODONE-acetaminophen (PERCOCET) 5-325 MG tablet Take 1 tablet by mouth every 8 (eight) hours as needed for severe pain.    pregabalin (LYRICA) 75 MG capsule Take 1 capsule (75 mg total) by mouth 2 (two) times daily.    tiZANidine (ZANAFLEX) 4 MG capsule Take 1 capsule (4 mg total) by mouth 3 (three) times daily.    valsartan (DIOVAN) 320 MG tablet Take 1 tablet (320 mg total) by mouth daily.    No facility-administered encounter medications on file as of 12/05/2021.   Reviewed chart prior to disease state call. Spoke with patient regarding BP  Recent Office Vitals: BP Readings from Last 3 Encounters:  11/17/21 (!) 152/86  11/13/21 (!) 145/88  10/13/21 (!) 152/92   Pulse Readings from Last 3 Encounters:  11/17/21 82  11/13/21 80  10/13/21 65    Wt Readings from Last 3 Encounters:  11/13/21 200 lb 12.8 oz (91.1 kg)  10/13/21 202 lb (91.6 kg)  09/30/21 202 lb 9.6 oz (91.9 kg)     Kidney Function Lab Results  Component Value Date/Time   CREATININE 0.99 10/23/2021 02:30 PM   CREATININE 0.77 10/13/2021 03:52 PM   GFRNONAA 70 09/26/2020 02:29 PM   GFRAA 82 09/26/2020 02:29 PM       Latest Ref Rng & Units  10/23/2021    2:30 PM 10/13/2021    3:52 PM 09/08/2021    3:26 PM  BMP  Glucose 65 - 99 mg/dL 95  146  81   BUN 7 - 25 mg/dL '7  11  10   '$ Creatinine 0.60 - 1.00 mg/dL 0.99  0.77  0.88   BUN/Creat Ratio 6 - 22 (calc) NOT APPLICABLE  NOT APPLICABLE  NOT APPLICABLE   Sodium 476 - 146 mmol/L 136  130  127   Potassium 3.5 - 5.3 mmol/L 4.1  4.0  4.0   Chloride 98 - 110 mmol/L 102  93  90   CO2 20 - 32 mmol/L '26  29  28   '$ Calcium 8.6 - 10.4 mg/dL 9.7  9.8  9.6     Current antihypertensive regimen:  Metoprolol 50 mg daily Valsartan 320 mg daily  How often are you checking your Blood Pressure? infrequently  Current home BP readings: None to report. Patient states she only checks  her blood pressure when she goes to the doctors office. She denies any headaches or dizziness.  What recent interventions/DTPs have been made by any provider to improve Blood Pressure control since last CPP Visit: No recent interventions or DTPs.  Any recent hospitalizations or ED visits since last visit with CPP? No  What diet changes have been made to improve Blood Pressure Control?  No recent diet changes. She does monitor her sodium intake.  What exercise is being done to improve your Blood Pressure Control?  Patient does not currently exercise.  Adherence Review: Is the patient currently on ACE/ARB medication? Yes Does the patient have >5 day gap between last estimated fill dates? No   Care Gaps: Medicare Annual Wellness: Completed 08/07/2021 Hemoglobin A1C: 5.7% on 06/04/2021 Colonoscopy: Completed 09/29/2019 Dexa Scan: Completed 04/02/2021 Mammogram: Completed 05/02/2021  Future Appointments  Date Time Provider [place]  12/09/2021  2:15 PM Susy Frizzle, MD [facility]  01/02/2022  2:00 PM Kirsteins, Luanna Salk, MD [facility]  01/06/2022  2:20 PM Ofilia Neas, PA-C [facility] None  04/30/2022  2:45 PM [facility] PHARMACIST [facility]  08/13/2022  2:00 PM BSF[facility]-NURSE HEALTH ADVISOR [facility]   Star Rating Drugs: Valsartan 320 mg last filled 09/13/2021 90 DS  April D Calhoun, [place] Pharmacist Assistant [phone]  Most recent in office blood pressure is controlled.  No changes continue to monitor.  Reviewed by: Beverly Milch, PharmD Clinical Pharmacist [phone]

## 2021-12-09 ENCOUNTER — Ambulatory Visit (INDEPENDENT_AMBULATORY_CARE_PROVIDER_SITE_OTHER): Payer: Medicare Other | Admitting: Family Medicine

## 2021-12-09 VITALS — BP 136/80 | HR 82 | Temp 97.9°F | Ht 63.0 in | Wt 200.4 lb

## 2021-12-09 DIAGNOSIS — M961 Postlaminectomy syndrome, not elsewhere classified: Secondary | ICD-10-CM | POA: Diagnosis not present

## 2021-12-09 DIAGNOSIS — M47816 Spondylosis without myelopathy or radiculopathy, lumbar region: Secondary | ICD-10-CM | POA: Diagnosis not present

## 2021-12-09 MED ORDER — FLUCONAZOLE 150 MG PO TABS: 150.0000 mg | ORAL_TABLET | Freq: Once | ORAL | 0 refills | Status: AC

## 2021-12-09 MED ORDER — PREGABALIN 100 MG PO CAPS: 100.0000 mg | ORAL_CAPSULE | Freq: Two times a day (BID) | ORAL | 3 refills | Status: DC

## 2021-12-09 NOTE — Progress Notes (Signed)
Subjective:    Patient ID: Janet Peterson, female    DOB: 09-26-1950, 71 y.o.   MRN: [id number] 5/23 Patient is a 71 year old Caucasian female who has a history of several surgeries on her back and is now dealing with postlaminectomy syndrome.  She would like a second opinion with a different neurosurgeon to see if she has any additional options.  She reports severe pain in the middle of her back when she tries to walk more than.  She is taking 1 oxycodone today.  She has tried gabapentin in the past.  Gabapentin provided her no relief.  She would like to try to increase oxycodone to 2 a day which I believe is reasonable.  She denies any leg weakness.  She denies any bowel or bladder incontinence.  There is no evidence of paralysis.  However she has constant severe pain in her mid back.  Also recently, the patient was found to have hyponatremia.  She is currently on hydrochlorothiazide.  She does not appear to be fluid overloaded on exam although she does have trace bipedal edema.   12/09/21 At that time I started the patient on Lyrica 75 mg twice a day for pain.  I felt that a lot of her pain was likely neuropathic in nature due to her postlaminectomy syndrome.  She also has fibromyalgia.  Patient states that the Lyrica has not helped with the back pain although it does help with burning and tingling pain in her feet.  She also states that seems to help with her depression.  Overall she is feeling better.  Her mood has improved.  Therefore she sees improvement in the neuropathic pain in her legs and feet as well as her overall mood but she has not seen improvement in the pain in the center of her back.  When I checked her sodium level in June, it was normal.  The patient has hyponatremia however she denies any dizziness or lightheadedness or nausea or vomiting.  Past Medical History:  Diagnosis Date   Ankylosing spondylitis ([place])    Anxiety and depression    Depression    Fibromyalgia    GERD  (gastroesophageal reflux disease)    Hiatal hernia    per patient, dx by GI    History of basal cell carcinoma excision    FACE, 1992  &  1996   History of hiatal hernia    History of thrush    Hyperlipidemia    Hypertension    Insomnia    Left breast mass    OCD (obsessive compulsive disorder)    Osteopenia    PONV (postoperative nausea and vomiting)    Psoriatic arthritis ([facility])    PVC (premature ventricular contraction)    Rheumatoid arthritis ([place])    Past Surgical History:  Procedure Laterality Date   BIOPSY  09/29/2019   Procedure: BIOPSY;  Surgeon: Ronnette Juniper, MD;  Location: [facility];  Service: Gastroenterology;;   BREAST EXCISIONAL BIOPSY Right    BREAST EXCISIONAL BIOPSY Left 05/04/2018   BREAST LUMPECTOMY WITH RADIOACTIVE SEED LOCALIZATION Left 05/04/2018   Procedure: LEFT BREAST LUMPECTOMY WITH RADIOACTIVE SEED LOCALIZATION AND LEFT BREAST NIPPLE BIOPSY;  Surgeon: Jovita Kussmaul, MD;  Location: [facility];  Service: General;  Laterality: Left;   BREAST SURGERY  05/25/1973   lumpectomy-- benign   BUNIONECTOMY  05/25/1989   CATARACT EXTRACTION W/ INTRAOCULAR LENS  IMPLANT, BILATERAL  05/25/1998   CERVICAL FUSION  07/24/2011   C5 -- C7  CESAREAN SECTION  05/26/1983   COLONOSCOPY WITH PROPOFOL N/A 09/29/2019   Procedure: COLONOSCOPY WITH PROPOFOL;  Surgeon: Ronnette Juniper, MD;  Location: [facility];  Service: Gastroenterology;  Laterality: N/A;   DISTAL INTERPHALANGEAL JOINT FUSION Right 03/14/2015   Procedure: RIGHT LONG FINGER DISTAL INTERPHALANGEAL JOINT ARTHRODESIS;  Surgeon: Iran Planas, MD;  Location: [facility];  Service: Orthopedics;  Laterality: Right;   ESOPHAGOGASTRODUODENOSCOPY (EGD) WITH PROPOFOL N/A 09/29/2019   Procedure: ESOPHAGOGASTRODUODENOSCOPY (EGD) WITH PROPOFOL;  Surgeon: Ronnette Juniper, MD;  Location: [facility];  Service: Gastroenterology;  Laterality: N/A;   EYE SURGERY  05/25/1993   rk (laser surgery), semi  cornea transplant, detacted retina,  fluid removal   HYSTEROSCOPY WITH D & C N/A 12/09/2020   Procedure: DILATATION AND CURETTAGE /HYSTEROSCOPY;  Surgeon: Drema Dallas, DO;  Location: [facility];  Service: Gynecology;  Laterality: N/A;   KNEE ARTHROSCOPY Left 03/03/2004   POLYPECTOMY  09/29/2019   Procedure: POLYPECTOMY;  Surgeon: Ronnette Juniper, MD;  Location: [facility];  Service: Gastroenterology;;   POSTERIOR VITRECTOMY RIGHT EYE AND LASER   10/27/1999   SHOULDER SURGERY Right 05/26/1995   SPINAL FIXATION SURGERY W/ IMPLANT  2013 rod #1//  2014  rod 2   S1 -- T10  (rod #1)//   S1 -- T4 (rod #2)   THORACIC FUSION  03/13/2013   REMOVAL HARDWARE/  BONE GRAFT FUSION T10//  REVISION OF RODS   TOTAL KNEE ARTHROPLASTY Left 12/14/2005   UVULOPALATOPHARYNGOPLASTY  04/26/2006   w/  TONSILLECTOMY/  TURBINATE REDUCTIONS/  BILATERAL ANTERIOR ETHMOIDECTOMY   Current Outpatient Medications on File Prior to Visit  Medication Sig Dispense Refill   buPROPion (WELLBUTRIN XL) 300 MG 24 hr tablet Take 300 mg by mouth every morning.  1   clonazePAM (KLONOPIN) 1 MG tablet TAKE 1 TABLET BY MOUTH EVERY DAY AS NEEDED FOR ANXIETY (Patient taking differently: Take 1 mg by mouth daily as needed for anxiety.) 30 tablet 1   dexlansoprazole (DEXILANT) 60 MG capsule Take 1 capsule (60 mg total) by mouth daily. 30 capsule 3   doxazosin (CARDURA) 2 MG tablet TAKE 1 TABLET BY MOUTH EVERY DAY 90 tablet 1   doxepin (SINEQUAN) 50 MG capsule Take 50 mg by mouth.     ezetimibe (ZETIA) 10 MG tablet TAKE 1 TABLET BY MOUTH EVERY DAY 90 tablet 3   FLUoxetine (PROZAC) 20 MG capsule Take 60 mg by mouth daily.     FLUoxetine (PROZAC) 40 MG capsule Take 40 mg by mouth daily.     fluticasone (FLONASE) 50 MCG/ACT nasal spray Place 2 sprays into both nostrils daily as needed for allergies. 16 g 11   hydrochlorothiazide (HYDRODIURIL) 25 MG tablet Take 1 tablet (25 mg total) by mouth daily. 90 tablet 3   INGREZZA 80 MG  capsule Take one capsule by mouth daily 30 capsule 1   leflunomide (ARAVA) 20 MG tablet TAKE 1 TABLET BY MOUTH EVERY DAY 90 tablet 0   metoprolol succinate (TOPROL-XL) 50 MG 24 hr tablet TAKE 1 TABLET BY MOUTH ONCE DAILY FOLLOWING A MEAL 90 tablet 1   omeprazole (PRILOSEC) 40 MG capsule TAKE 1 CAPSULE BY MOUTH EVERY MORNING AND AT BEDTIME (Patient not taking: Reported on 12/09/2021) 180 capsule 1   ondansetron (ZOFRAN) 4 MG tablet Take 4 mg by mouth 4 (four) times daily as needed for nausea. (Patient not taking: Reported on 12/09/2021)     oxyCODONE-acetaminophen (PERCOCET) 5-325 MG tablet Take 1 tablet by mouth every 8 (eight) hours  as needed for severe pain. 60 tablet 0   pregabalin (LYRICA) 75 MG capsule Take 1 capsule (75 mg total) by mouth 2 (two) times daily. 60 capsule 0   tiZANidine (ZANAFLEX) 4 MG capsule Take 1 capsule (4 mg total) by mouth 3 (three) times daily. 30 capsule 1   valsartan (DIOVAN) 320 MG tablet Take 1 tablet (320 mg total) by mouth daily. 90 tablet 3   No current facility-administered medications on file prior to visit.   Allergies  Allergen Reactions   Latex Rash    And Mouth sores Other reaction(s): rash Other reaction(s): rash Other reaction(s): rash   Amlodipine Besy-Benazepril Hcl Other (See Comments)    COUGH   Amoxicillin-Pot Clavulanate Diarrhea and Nausea And Vomiting    Other reaction(s): sick on stomach Other reaction(s): sick on stomach   Bextra [Valdecoxib] Diarrhea and Nausea And Vomiting    REFLUX   Chlorhexidine     rash Other reaction(s): rash all over body Other reaction(s): rash all over body   Lipitor [Atorvastatin Calcium] Other (See Comments)    MYALGIAS   Sulfa Antibiotics Nausea And Vomiting    CRAMPING Other reaction(s): stomach hurts Other reaction(s): stomach hurts   Zocor [Simvastatin] Other (See Comments)    MYALGIA   Social History   Socioeconomic History   Marital status: Married    Spouse name: Daryl   Number of  children: Not on file   Years of education: Not on file   Highest education level: Not on file  Occupational History    Comment: retired  Tobacco Use   Smoking status: Former    Packs/day: 0.50    Years: 10.00    Total pack years: 5.00    Types: Cigarettes    Quit date: 03/07/1995    Years since quitting: 26.7    Passive exposure: Never   Smokeless tobacco: Never  Vaping Use   Vaping Use: Never used  Substance and Sexual Activity   Alcohol use: Yes    Comment: rare   Drug use: Never   Sexual activity: Not on file  Other Topics Concern   Not on file  Social History Narrative   Lives with husband   Social Determinants of Health   Financial Resource Strain: Low Risk  (08/07/2021)   Overall Financial Resource Strain (CARDIA)    Difficulty of Paying Living Expenses: Not hard at all  Food Insecurity: No Food Insecurity (08/07/2021)   Hunger Vital Sign    Worried About Running Out of Food in the Last Year: Never true    Ran Out of Food in the Last Year: Never true  Transportation Needs: No Transportation Needs (08/07/2021)   PRAPARE - Hydrologist (Medical): No    Lack of Transportation (Non-Medical): No  Physical Activity: Inactive (08/07/2021)   Exercise Vital Sign    Days of Exercise per Week: 0 days    Minutes of Exercise per Session: 0 min  Stress: No Stress Concern Present (08/07/2021)   [place]    Feeling of Stress : Not at all  Social Connections: [place] (08/07/2021)   Social Connection and Isolation Panel [NHANES]    Frequency of Communication with Friends and Family: More than three times a week    Frequency of Social Gatherings with Friends and Family: More than three times a week    Attends Religious Services: More than 4 times per year    Active Member  of Clubs or Organizations: Yes    Attends Archivist Meetings: More than 4 times per year     Marital Status: Married  Human resources officer Violence: Not At Risk (08/07/2021)   Humiliation, Afraid, Rape, and Kick questionnaire    Fear of Current or Ex-Partner: No    Emotionally Abused: No    Physically Abused: No    Sexually Abused: No    Review of Systems  Gastrointestinal:  Positive for abdominal pain.  All other systems reviewed and are negative.      Objective:   Physical Exam Vitals reviewed.  Constitutional:      General: She is not in acute distress.    Appearance: Normal appearance. She is normal weight. She is not ill-appearing or toxic-appearing.  HENT:     Right Ear: Tympanic membrane normal. No swelling or tenderness.     Left Ear: Tympanic membrane and external ear normal. No decreased hearing noted. Swelling and tenderness present.  No middle ear effusion. No foreign body. No mastoid tenderness.  Cardiovascular:     Rate and Rhythm: Normal rate and regular rhythm.     Heart sounds: Normal heart sounds. No murmur heard.    No friction rub. No gallop.  Pulmonary:     Effort: Pulmonary effort is normal. No respiratory distress.     Breath sounds: Wheezing present. No rhonchi or rales.  Abdominal:     General: Abdomen is flat. Bowel sounds are normal. There is no distension.     Palpations: Abdomen is soft.     Tenderness: There is no abdominal tenderness.  Musculoskeletal:     Thoracic back: Deformity, spasms, tenderness and bony tenderness present. Decreased range of motion.     Lumbar back: Tenderness and bony tenderness present. Decreased range of motion.     Right lower leg: No edema.     Left lower leg: No edema.  Neurological:     Mental Status: She is alert.           Assessment & Plan:  Thoracic postlaminectomy syndrome  DDD lumbar spine status post fusion  Cervical post-laminectomy syndrome Patient does have an underlying history of fibromyalgia in her chart.  I will increase Lyrica to 100 mg twice a day to see if I can overall help her  pain level.  She feels like the medication is helping so I am grateful for that.  Her blood pressure is excellent and her most recent sodium level is normal.  I did give the patient prescription for Diflucan for vaginal itching secondary to a yeast infection.  Also gave her a prescription for right-sided heel lift.  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

## 2021-12-24 ENCOUNTER — Other Ambulatory Visit: Payer: Self-pay

## 2021-12-24 NOTE — Progress Notes (Signed)
Office Visit Note  Patient: Janet Peterson             Date of Birth: [birth year]           MRN: [id number]             PCP: Susy Frizzle, MD Referring: Susy Frizzle, MD Visit Date: 01/06/2022 Occupation: '@GUAROCC'$ @  Subjective:  Medication monitoring   History of Present Illness: Janet Peterson is a 71 y.o. female with history of psoriatic arthritis, osteoarthritis, fibromyalgia, and DDD.  She is taking arava 20 mg 1 tablet by mouth daily.  She continues to tolerate Arava without any side effects and has not missed any doses recently.  She denies any signs or symptoms of a psoriatic arthritis flare.  She denies any active psoriasis at this time.  She denies any Achilles tendinitis or plantar fasciitis.  She has not had any SI joint discomfort.  She denies any joint swelling at this time.  She denies any signs or symptoms of uveitis.  She has trapezius muscle tension and tenderness bilaterally.  No muscle spasms. She continues to have chronic discomfort in her lower back.   She is scheduled for an MRI next week for further evaluation.  She will be then following up with neurosurgery for further evaluation and recommendations.  She continues to take oxycodone for pain relief.  She states that her lower back limits her mobility and activity level.  She states that her right knee joint pain has improved significantly since undergoing Visco gel injections in June/July 2023.  She states her left knee replacement is doing well.  She denies any joint swelling at this time. She denies any recent or recurrent infections.  She denies any new medical conditions.     Activities of Daily Living:  Patient reports morning stiffness for 1 hour.   Patient Reports nocturnal pain.  Difficulty dressing/grooming: Denies Difficulty climbing stairs: Reports Difficulty getting out of chair: Reports Difficulty using hands for taps, buttons, cutlery, and/or writing: Reports  Review of Systems  Constitutional:   Positive for fatigue.  HENT:  Negative for mouth sores, mouth dryness and nose dryness.   Eyes:  Positive for dryness. Negative for pain and visual disturbance.  Respiratory:  Negative for cough, hemoptysis and difficulty breathing.   Cardiovascular:  Negative for chest pain, palpitations, hypertension and swelling in legs/feet.  Gastrointestinal:  Positive for constipation. Negative for blood in stool and diarrhea.  Endocrine: Negative for increased urination.  Genitourinary:  Negative for painful urination and involuntary urination.  Musculoskeletal:  Positive for joint pain, gait problem, joint pain, myalgias, muscle weakness, morning stiffness, muscle tenderness and myalgias. Negative for joint swelling.  Skin:  Negative for color change, pallor, rash, hair loss, nodules/bumps, skin tightness, ulcers and sensitivity to sunlight.  Allergic/Immunologic: Positive for susceptible to infections.  Neurological:  Negative for dizziness, numbness, headaches and weakness.  Hematological:  Negative for swollen glands.  Psychiatric/Behavioral:  Positive for depressed mood. Negative for sleep disturbance. The patient is nervous/anxious.     PMFS History:  Patient Active Problem List   Diagnosis Date Noted   Lesion of liver 06/20/2021   C. difficile colitis 09/29/2019   Colitis 09/28/2019   Anxiety and depression 09/28/2019   DDD (degenerative disc disease), cervical s/p fusion 11/12/2016   H/O total knee replacement, left 05/06/2016   DDD lumbar spine status post fusion 05/06/2016   Psoriasis 05/05/2016   High risk medication use 05/05/2016   Cervical post-laminectomy  syndrome 12/09/2015   Thoracic postlaminectomy syndrome 12/09/2015   Psoriatic arthritis ([place]) 08/23/2012   Osteopenia 08/23/2012   HLD (hyperlipidemia) 08/23/2012   RLS (restless legs syndrome) 08/23/2012   Insomnia 08/23/2012   Fibromyalgia syndrome 08/12/2012   Low back pain 08/12/2012   Syncope    PVC (premature  ventricular contraction)    OCD (obsessive compulsive disorder)    GERD (gastroesophageal reflux disease)    Hypertension     Past Medical History:  Diagnosis Date   Ankylosing spondylitis (HCC)    Anxiety and depression    Depression    Fibromyalgia    GERD (gastroesophageal reflux disease)    Hiatal hernia    per patient, dx by GI    History of basal cell carcinoma excision    FACE, 1992  &  1996   History of hiatal hernia    History of thrush    Hyperlipidemia    Hypertension    Insomnia    Left breast mass    OCD (obsessive compulsive disorder)    Osteopenia    PONV (postoperative nausea and vomiting)    Psoriatic arthritis (HCC)    PVC (premature ventricular contraction)    Rheumatoid arthritis ([place])     Family History  Problem Relation Age of Onset   Diabetes Mother    Heart disease Mother    Diabetes Father    Anuerysm Father    Diabetes Brother    Heart disease Brother    Diabetes Brother    Heart disease Brother    Breast cancer Neg Hx    Past Surgical History:  Procedure Laterality Date   BIOPSY  09/29/2019   Procedure: BIOPSY;  Surgeon: Ronnette Juniper, MD;  Location: [facility];  Service: Gastroenterology;;   BREAST EXCISIONAL BIOPSY Right    BREAST EXCISIONAL BIOPSY Left 05/04/2018   BREAST LUMPECTOMY WITH RADIOACTIVE SEED LOCALIZATION Left 05/04/2018   Procedure: LEFT BREAST LUMPECTOMY WITH RADIOACTIVE SEED LOCALIZATION AND LEFT BREAST NIPPLE BIOPSY;  Surgeon: Jovita Kussmaul, MD;  Location: [facility];  Service: General;  Laterality: Left;   BREAST SURGERY  05/25/1973   lumpectomy-- benign   BUNIONECTOMY  05/25/1989   CATARACT EXTRACTION W/ INTRAOCULAR LENS  IMPLANT, BILATERAL  05/25/1998   CERVICAL FUSION  07/24/2011   C5 -- C7   CESAREAN SECTION  05/26/1983   COLONOSCOPY WITH PROPOFOL N/A 09/29/2019   Procedure: COLONOSCOPY WITH PROPOFOL;  Surgeon: Ronnette Juniper, MD;  Location: [facility];  Service: Gastroenterology;  Laterality:  N/A;   DISTAL INTERPHALANGEAL JOINT FUSION Right 03/14/2015   Procedure: RIGHT LONG FINGER DISTAL INTERPHALANGEAL JOINT ARTHRODESIS;  Surgeon: Iran Planas, MD;  Location: [facility];  Service: Orthopedics;  Laterality: Right;   ESOPHAGOGASTRODUODENOSCOPY (EGD) WITH PROPOFOL N/A 09/29/2019   Procedure: ESOPHAGOGASTRODUODENOSCOPY (EGD) WITH PROPOFOL;  Surgeon: Ronnette Juniper, MD;  Location: [facility];  Service: Gastroenterology;  Laterality: N/A;   EYE SURGERY  05/25/1993   rk (laser surgery), semi cornea transplant, detacted retina,  fluid removal   HYSTEROSCOPY WITH D & C N/A 12/09/2020   Procedure: DILATATION AND CURETTAGE /HYSTEROSCOPY;  Surgeon: Drema Dallas, DO;  Location: [facility];  Service: Gynecology;  Laterality: N/A;   KNEE ARTHROSCOPY Left 03/03/2004   POLYPECTOMY  09/29/2019   Procedure: POLYPECTOMY;  Surgeon: Ronnette Juniper, MD;  Location: [facility];  Service: Gastroenterology;;   POSTERIOR VITRECTOMY RIGHT EYE AND LASER   10/27/1999   SHOULDER SURGERY Right 05/26/1995   SPINAL FIXATION SURGERY W/ IMPLANT  2013 rod #1//  2014  rod 2   S1 -- T10  (rod #1)//   S1 -- T4 (rod #2)   THORACIC FUSION  03/13/2013   REMOVAL HARDWARE/  BONE GRAFT FUSION T10//  REVISION OF RODS   TOTAL KNEE ARTHROPLASTY Left 12/14/2005   UVULOPALATOPHARYNGOPLASTY  04/26/2006   w/  TONSILLECTOMY/  TURBINATE REDUCTIONS/  BILATERAL ANTERIOR ETHMOIDECTOMY   Social History   Social History Narrative   Lives with husband   Immunization History  Administered Date(s) Administered   Fluad Quad(high Dose 65+) 01/26/2019, 03/05/2020   Influenza Whole 03/04/2012   Influenza, High Dose Seasonal PF 03/25/2018   Influenza,inj,Quad PF,6+ Mos 02/28/2013, 03/07/2014, 02/28/2015, 03/27/2016, 03/09/2017   Influenza-Unspecified 04/08/2021   Pneumococcal Conjugate-13 09/10/2016   Pneumococcal Polysaccharide-23 03/12/2006, 11/09/2011   Pneumococcal-Unspecified 08/31/2016    Unspecified SARS-COV-2 Vaccination 08/07/2019, 08/28/2019, 03/08/2020     Objective: Vital Signs: BP 97/67 (BP Location: Left Arm, Patient Position: Sitting, Cuff Size: Normal)   Pulse 88   Resp 16   Ht '5\' 3"'$  (1.6 m)   Wt 201 lb 6.4 oz (91.4 kg)   BMI 35.68 kg/m    Physical Exam Vitals and nursing note reviewed.  Constitutional:      Appearance: She is well-developed.  HENT:     Head: Normocephalic and atraumatic.  Eyes:     Conjunctiva/sclera: Conjunctivae normal.  Cardiovascular:     Rate and Rhythm: Normal rate and regular rhythm.     Heart sounds: Normal heart sounds.  Pulmonary:     Effort: Pulmonary effort is normal.     Breath sounds: Normal breath sounds.  Abdominal:     General: Bowel sounds are normal.     Palpations: Abdomen is soft.  Musculoskeletal:     Cervical back: Normal range of motion.  Skin:    General: Skin is warm and dry.     Capillary Refill: Capillary refill takes less than 2 seconds.  Neurological:     Mental Status: She is alert and oriented to person, place, and time.  Psychiatric:        Behavior: Behavior normal.      Musculoskeletal Exam: C-spine has limited ROM.  Trapezius muscle tension and tenderness bilaterally.  Painful ROM of thoracic and lumbar spine.  No SI joint tenderness.  Shoulder joints, elbow joints, wrist joints, MCPs, PIPs, DIPs have good ROM with no discomfort.  PIP and DIP thickening consistent with osteoarthritis.  Hip joints have good ROM with no groin pain.  Right knee has good ROM with no warmth or effusion.  Left knee replacement has good ROM with no discomfort.  Ankle joints have good ROM with no tenderness or joint swelling.   CDAI Exam: CDAI Score: -- Patient Global: --; Provider Global: -- Swollen: --; Tender: -- Joint Exam 01/06/2022   No joint exam has been documented for this visit   There is currently no information documented on the homunculus. Go to the Rheumatology activity and complete the homunculus  joint exam.  Investigation: No additional findings.  Imaging: No results found.  Recent Labs: Lab Results  Component Value Date   WBC 6.6 09/08/2021   HGB 11.6 (L) 09/08/2021   PLT 252 09/08/2021   NA 136 10/23/2021   K 4.1 10/23/2021   CL 102 10/23/2021   CO2 26 10/23/2021   GLUCOSE 95 10/23/2021   BUN 7 10/23/2021   CREATININE 0.99 10/23/2021   BILITOT 0.3 09/08/2021   ALKPHOS 118 04/25/2020   AST 13 09/08/2021  ALT 6 09/08/2021   PROT 6.5 09/08/2021   ALBUMIN 3.5 04/25/2020   CALCIUM 9.7 10/23/2021   GFRAA 82 09/26/2020   QFTBGOLD Indeterminate 08/14/2016   QFTBGOLDPLUS Negative 12/22/2018    Speciality Comments: Simponi Aria every 8 weeks started Jan 2018  TB gold negative 08/19/16  Procedures:  No procedures performed Allergies: Latex, Amlodipine besy-benazepril hcl, Amoxicillin-pot clavulanate, Bextra [valdecoxib], Chlorhexidine, Lipitor [atorvastatin calcium], Sulfa antibiotics, and Zocor [simvastatin]    Assessment / Plan:     Visit Diagnoses: Psoriatic arthritis ([place]): She has no synovitis or dactylitis on examination today.  She has not had any signs or symptoms of a psoriatic arthritis flare.  She is clinically doing well taking Arava 20 mg 1 tablet by mouth daily.  She is tolerating Arava without any side effects and has not missed any doses recently.  She has no evidence of Achilles tendinitis or plantar fasciitis.  She has no SI joint tenderness upon palpation.  She has no active psoriasis at this time.  No signs or symptoms of uveitis. She will remain on [place] as prescribed.  She was advised to notify us if she develops signs or symptoms of a flare.  She will follow-up in the office in 5 months or sooner if needed.  Psoriasis: No active psoriasis.    High risk medication use - Arava 20 mg 1 tablet by mouth daily.  Previously was on Simponi Aria IV infusion 2 mg/kg every 8 weeks-patient discontinued in March 2021 due to C. difficile.  - Plan: CBC with  Differential/Platelet, COMPLETE METABOLIC PANEL WITH GFR CBC and CMP updated on 09/08/21.  Orders for CBC and CMP released today.  Her next lab work will be due in November and every 3 months to monitor for drug toxicity.  Standing orders for CBC and CMP remain in place. She has not had any recent or recurrent infections. Discussed the importance of holding arava if she develops signs or symptoms of an infection and to resume once the infection has completely cleared.  She is planning on receiving the annual flu shot this fall.  She has received both Shingrix vaccinations in spring 2023.  Primary osteoarthritis of both hands: She has PIP and DIP thickening consistent with osteoarthritis of both hands.  Discussed the importance of joint protection and muscle strengthening.  Discussed the importance of performing hand exercises daily.  Primary osteoarthritis of right knee - Visco right knee  June/July 2022.  Improved.  She has good range of motion of the right knee with no warmth or effusion.  Her right knee joint pain and stiffness has improved significantly since undergoing Visco gel injections.  H/O total knee replacement, left: Doing well.  Good range of motion with no discomfort.  DDD (degenerative disc disease), cervical s/p fusion: She has limited range of motion without rotation.  DDD (degenerative disc disease), thoracic: Chronic pain.  Scheduled for MRI of the thoracic spine on 01/20/2022.  DDD lumbar spine status post fusion: Chronic pain.  She continues to take Percocet for pain relief.  Thoracic and lumbar spine discomfort limits her mobility and activity.  She is scheduled for an upcoming MRI next week for further evaluation.  She will be following up with a neurosurgeon for further evaluation and management.  Fibromyalgia: She presents today with trapezius muscle tension and tenderness bilaterally.  She has not been experiencing muscle spasms recently.  She takes Lyrica as prescribed.   Discussed the importance of regular exercise and good sleep hygiene.  Primary  insomnia: Discussed the importance of good sleep hygiene.  Other fatigue: Chronic and secondary to insomnia.  Discussed the importance of regular exercise and good sleep hygiene.  Osteopenia of multiple sites - DEXA scan from April 02 2021 DEXA showed T score -1.3.  BMD 0.857 in the left femoral neck.  Other medical conditions are listed as follows:   History of hyperlipidemia  History of hypertension: BP 97/67 today in the office.   History of gastroesophageal reflux (GERD)  History of OCD (obsessive compulsive disorder)  RLS (restless legs syndrome)  History of migraine  Orders: Orders Placed This Encounter  Procedures   CBC with Differential/Platelet   COMPLETE METABOLIC PANEL WITH GFR   No orders of the defined types were placed in this encounter.    Follow-Up Instructions: Return in about 5 months (around 06/08/2022) for Psoriatic arthritis, Osteoarthritis, Fibromyalgia, DDD.   Ofilia Neas, PA-C  Note - This record has been created using Dragon software.  Chart creation errors have been sought, but may not always  have been located. Such creation errors do not reflect on  the standard of medical care.

## 2021-12-24 NOTE — Telephone Encounter (Signed)
LOV 12/09/21 Last refill 11/20/21, #[phone], 0 refills  Please review, thanks!

## 2021-12-25 MED ORDER — OXYCODONE-ACETAMINOPHEN 5-325 MG PO TABS: 1.0000 | ORAL_TABLET | Freq: Three times a day (TID) | ORAL | 0 refills | Status: DC | PRN

## 2022-01-02 ENCOUNTER — Other Ambulatory Visit: Payer: Self-pay | Admitting: Neurological Surgery

## 2022-01-02 ENCOUNTER — Encounter: Payer: Medicare Other | Attending: Physical Medicine & Rehabilitation | Admitting: Physical Medicine & Rehabilitation

## 2022-01-02 ENCOUNTER — Encounter: Payer: Self-pay | Admitting: Physical Medicine & Rehabilitation

## 2022-01-02 VITALS — BP 121/84 | HR 81 | Ht 63.0 in | Wt 203.0 lb

## 2022-01-02 DIAGNOSIS — M4714 Other spondylosis with myelopathy, thoracic region: Secondary | ICD-10-CM

## 2022-01-02 DIAGNOSIS — M961 Postlaminectomy syndrome, not elsewhere classified: Secondary | ICD-10-CM | POA: Insufficient documentation

## 2022-01-02 NOTE — Progress Notes (Signed)
Subjective:    Patient ID: Janet Peterson, female    DOB: 04/23/1951, 71 y.o.   MRN: [id number]  HPI 71 year old female with history of chronic upper back pain.  She has a history of thoracolumbar scoliosis and has undergone T4-L2 instrumented fusion.  Pain is located periscapular area mainly medially.  She has failed physical therapy, medication management with opioid and muscle relaxers as well as neuropathic pain medicines, botulinum toxin injections, and acupuncture.  She denies any significant neck pain reviewed cervical spine CT dated 03/08/2014 which showed C5-C7 fusion intact.  Some facet hypertrophy at C3-4 and C4-5 levels on the right side. Pain Inventory Average Pain 10 Pain Right Now 6 My pain is intermittent, constant, sharp, burning, stabbing, tingling, and aching  In the last 24 hours, has pain interfered with the following? General activity 7 Relation with others 0 Enjoyment of life 5 What TIME of day is your pain at its worst? varies Sleep (in general) Fair  Pain is worse with: walking, bending, sitting, standing, and some activites Pain improves with: rest and medication Relief from Meds: 6  Family History  Problem Relation Age of Onset   Diabetes Mother    Heart disease Mother    Diabetes Father    Anuerysm Father    Diabetes Brother    Heart disease Brother    Diabetes Brother    Heart disease Brother    Breast cancer Neg Hx    Social History   Socioeconomic History   Marital status: Married    Spouse name: Daryl   Number of children: Not on file   Years of education: Not on file   Highest education level: Not on file  Occupational History    Comment: retired  Tobacco Use   Smoking status: Former    Packs/day: 0.50    Years: 10.00    Total pack years: 5.00    Types: Cigarettes    Quit date: 03/07/1995    Years since quitting: 26.8    Passive exposure: Never   Smokeless tobacco: Never  Vaping Use   Vaping Use: Never used  Substance and Sexual  Activity   Alcohol use: Yes    Comment: rare   Drug use: Never   Sexual activity: Not on file  Other Topics Concern   Not on file  Social History Narrative   Lives with husband   Social Determinants of Health   Financial Resource Strain: Low Risk  (08/07/2021)   Overall Financial Resource Strain (CARDIA)    Difficulty of Paying Living Expenses: Not hard at all  Food Insecurity: No Food Insecurity (08/07/2021)   Hunger Vital Sign    Worried About Running Out of Food in the Last Year: Never true    Ran Out of Food in the Last Year: Never true  Transportation Needs: No Transportation Needs (08/07/2021)   PRAPARE - Hydrologist (Medical): No    Lack of Transportation (Non-Medical): No  Physical Activity: Inactive (08/07/2021)   Exercise Vital Sign    Days of Exercise per Week: 0 days    Minutes of Exercise per Session: 0 min  Stress: No Stress Concern Present (08/07/2021)   [place]    Feeling of Stress : Not at all  Social Connections: [place] (08/07/2021)   Social Connection and Isolation Panel [NHANES]    Frequency of Communication with Friends and Family: More than three times a week  Frequency of Social Gatherings with Friends and Family: More than three times a week    Attends Religious Services: More than 4 times per year    Active Member of Clubs or Organizations: Yes    Attends Archivist Meetings: More than 4 times per year    Marital Status: Married   Past Surgical History:  Procedure Laterality Date   BIOPSY  09/29/2019   Procedure: BIOPSY;  Surgeon: Ronnette Juniper, MD;  [facility]: [facility];  Service: Gastroenterology;;   BREAST EXCISIONAL BIOPSY Right    BREAST EXCISIONAL BIOPSY Left 05/04/2018   BREAST LUMPECTOMY WITH RADIOACTIVE SEED LOCALIZATION Left 05/04/2018   Procedure: LEFT BREAST LUMPECTOMY WITH RADIOACTIVE SEED LOCALIZATION AND LEFT BREAST  NIPPLE BIOPSY;  Surgeon: Jovita Kussmaul, MD;  Location: [place];  Service: General;  Laterality: Left;   BREAST SURGERY  05/25/1973   lumpectomy-- benign   BUNIONECTOMY  05/25/1989   CATARACT EXTRACTION W/ INTRAOCULAR LENS  IMPLANT, BILATERAL  05/25/1998   CERVICAL FUSION  07/24/2011   C5 -- C7   CESAREAN SECTION  05/26/1983   COLONOSCOPY WITH PROPOFOL N/A 09/29/2019   Procedure: COLONOSCOPY WITH PROPOFOL;  Surgeon: Ronnette Juniper, MD;  [facility];  Service: Gastroenterology;  Laterality: N/A;   DISTAL INTERPHALANGEAL JOINT FUSION Right 03/14/2015   Procedure: RIGHT LONG FINGER DISTAL INTERPHALANGEAL JOINT ARTHRODESIS;  Surgeon: Iran Planas, MD;  Location: [facility];  Service: Orthopedics;  Laterality: Right;   ESOPHAGOGASTRODUODENOSCOPY (EGD) WITH PROPOFOL N/A 09/29/2019   Procedure: ESOPHAGOGASTRODUODENOSCOPY (EGD) WITH PROPOFOL;  Surgeon: Ronnette Juniper, MD;  [facility];  Service: Gastroenterology;  Laterality: N/A;   EYE SURGERY  05/25/1993   rk (laser surgery), semi cornea transplant, detacted retina,  fluid removal   HYSTEROSCOPY WITH D & C N/A 12/09/2020   Procedure: DILATATION AND CURETTAGE /HYSTEROSCOPY;  Surgeon: Drema Dallas, DO;  Location: [place];  Service: Gynecology;  Laterality: N/A;   KNEE ARTHROSCOPY Left 03/03/2004   POLYPECTOMY  09/29/2019   Procedure: POLYPECTOMY;  Surgeon: Ronnette Juniper, MD;  [facility];  Service: Gastroenterology;;   POSTERIOR VITRECTOMY RIGHT EYE AND LASER   10/27/1999   SHOULDER SURGERY Right 05/26/1995   SPINAL FIXATION SURGERY W/ IMPLANT  2013 rod #1//  2014  rod 2   S1 -- T10  (rod #1)//   S1 -- T4 (rod #2)   THORACIC FUSION  03/13/2013   REMOVAL HARDWARE/  BONE GRAFT FUSION T10//  REVISION OF RODS   TOTAL KNEE ARTHROPLASTY Left 12/14/2005   UVULOPALATOPHARYNGOPLASTY  04/26/2006   w/  TONSILLECTOMY/  TURBINATE REDUCTIONS/  BILATERAL ANTERIOR ETHMOIDECTOMY    Past Surgical History:  Procedure Laterality Date   BIOPSY  09/29/2019   Procedure: BIOPSY;  Surgeon: Ronnette Juniper, MD;  [facility];  Service: Gastroenterology;;   BREAST EXCISIONAL BIOPSY Right    BREAST EXCISIONAL BIOPSY Left 05/04/2018   BREAST LUMPECTOMY WITH RADIOACTIVE SEED LOCALIZATION Left 05/04/2018   Procedure: LEFT BREAST LUMPECTOMY WITH RADIOACTIVE SEED LOCALIZATION AND LEFT BREAST NIPPLE BIOPSY;  Surgeon: Jovita Kussmaul, MD;  Location: [place];  Service: General;  Laterality: Left;   BREAST SURGERY  05/25/1973   lumpectomy-- benign   BUNIONECTOMY  05/25/1989   CATARACT EXTRACTION W/ INTRAOCULAR LENS  IMPLANT, BILATERAL  05/25/1998   CERVICAL FUSION  07/24/2011   C5 -- C7   CESAREAN SECTION  05/26/1983   COLONOSCOPY WITH PROPOFOL N/A 09/29/2019   Procedure: COLONOSCOPY WITH PROPOFOL;  Surgeon: Ronnette Juniper,  MD;  [facility];  Service: Gastroenterology;  Laterality: N/A;   DISTAL INTERPHALANGEAL JOINT FUSION Right 03/14/2015   Procedure: RIGHT LONG FINGER DISTAL INTERPHALANGEAL JOINT ARTHRODESIS;  Surgeon: Iran Planas, MD;  Location: [place];  Service: Orthopedics;  Laterality: Right;   ESOPHAGOGASTRODUODENOSCOPY (EGD) WITH PROPOFOL N/A 09/29/2019   Procedure: ESOPHAGOGASTRODUODENOSCOPY (EGD) WITH PROPOFOL;  Surgeon: Ronnette Juniper, MD;  [facility];  Service: Gastroenterology;  Laterality: N/A;   EYE SURGERY  05/25/1993   rk (laser surgery), semi cornea transplant, detacted retina,  fluid removal   HYSTEROSCOPY WITH D & C N/A 12/09/2020   Procedure: DILATATION AND CURETTAGE /HYSTEROSCOPY;  Surgeon: Drema Dallas, DO;  Location: [place];  Service: Gynecology;  Laterality: N/A;   KNEE ARTHROSCOPY Left 03/03/2004   POLYPECTOMY  09/29/2019   Procedure: POLYPECTOMY;  Surgeon: Ronnette Juniper, MD;  [facility];  Service: Gastroenterology;;   POSTERIOR VITRECTOMY RIGHT EYE AND LASER    10/27/1999   SHOULDER SURGERY Right 05/26/1995   SPINAL FIXATION SURGERY W/ IMPLANT  2013 rod #1//  2014  rod 2   S1 -- T10  (rod #1)//   S1 -- T4 (rod #2)   THORACIC FUSION  03/13/2013   REMOVAL HARDWARE/  BONE GRAFT FUSION T10//  REVISION OF RODS   TOTAL KNEE ARTHROPLASTY Left 12/14/2005   UVULOPALATOPHARYNGOPLASTY  04/26/2006   w/  TONSILLECTOMY/  TURBINATE REDUCTIONS/  BILATERAL ANTERIOR ETHMOIDECTOMY   Past Medical History:  Diagnosis Date   Ankylosing spondylitis ([place])    Anxiety and depression    Depression    Fibromyalgia    GERD (gastroesophageal reflux disease)    Hiatal hernia    per patient, dx by GI    History of basal cell carcinoma excision    FACE, 1992  &  1996   History of hiatal hernia    History of thrush    Hyperlipidemia    Hypertension    Insomnia    Left breast mass    OCD (obsessive compulsive disorder)    Osteopenia    PONV (postoperative nausea and vomiting)    Psoriatic arthritis (HCC)    PVC (premature ventricular contraction)    Rheumatoid arthritis (HCC)    BP 121/84   Pulse 81   Ht '5\' 3"'$  (1.6 m)   Wt 203 lb (92.1 kg)   SpO2 96%   BMI 35.96 kg/m   Opioid Risk Score:   Fall Risk Score:  `1  Depression screen PHQ 2/9     01/02/2022    2:10 PM 11/13/2021    1:39 PM 09/23/2021    2:43 PM 09/16/2021    1:28 PM 08/07/2021    2:13 PM 06/05/2021   11:44 AM 04/29/2021    1:32 PM  Depression screen PHQ 2/9  Decreased Interest 0 1 1 0 0 0 1  Down, Depressed, Hopeless 0 1 1 0 0 0 1  PHQ - 2 Score 0 2 2 0 0 0 2     Review of Systems     Objective:   Physical Exam Vitals and nursing note reviewed.  Constitutional:      Appearance: She is obese.  HENT:     Head: Normocephalic and atraumatic.  Eyes:     Extraocular Movements: Extraocular movements intact.     Conjunctiva/sclera: Conjunctivae normal.     Pupils: Pupils are equal, round, and reactive to light.  Neck:     Comments: Cervical spine range of motion is 50% of  normal with  flexion extension lateral bending and rotation however she does not have any significant pain in the neck or in the upper back with any cervical spine range of motion.  Musculoskeletal:     Comments: Patient with normal shoulder elbow wrist hand range of motion There is tenderness palpation at the medial parascapular area as well as superior parascapular area.  Mild kyphosis in the upper spine area.  Neurological:     Mental Status: She is alert.   Motor strength is 5/5 bilateral deltoid bicep tricep grip Normal sensation bilateral upper extremities        Assessment & Plan:  1.  Thoracic postlaminectomy syndrome she has no neuropathic pain mainly pain above the level of fusion i.e. T4-C7 region.  Does not appear to be coming from the cervical spine.  We discussed that she has not responded well to any interventional options thus far.  She may be a good candidate for shockwave therapy however this is not covered by insurance and the patient states she is unwilling to cover the out-of-pocket cost.

## 2022-01-02 NOTE — Patient Instructions (Signed)
Please call if you'd consider shock wave therapy , 60-120.00 per session , would need 6 sessions

## 2022-01-06 ENCOUNTER — Ambulatory Visit: Payer: Medicare Other | Attending: Physician Assistant | Admitting: Physician Assistant

## 2022-01-06 ENCOUNTER — Encounter: Payer: Self-pay | Admitting: Physician Assistant

## 2022-01-06 VITALS — BP 97/67 | HR 88 | Resp 16 | Ht 63.0 in | Wt 201.4 lb

## 2022-01-06 DIAGNOSIS — Z8639 Personal history of other endocrine, nutritional and metabolic disease: Secondary | ICD-10-CM

## 2022-01-06 DIAGNOSIS — M797 Fibromyalgia: Secondary | ICD-10-CM

## 2022-01-06 DIAGNOSIS — L409 Psoriasis, unspecified: Secondary | ICD-10-CM | POA: Diagnosis not present

## 2022-01-06 DIAGNOSIS — F5101 Primary insomnia: Secondary | ICD-10-CM

## 2022-01-06 DIAGNOSIS — Z8659 Personal history of other mental and behavioral disorders: Secondary | ICD-10-CM

## 2022-01-06 DIAGNOSIS — L4050 Arthropathic psoriasis, unspecified: Secondary | ICD-10-CM | POA: Diagnosis not present

## 2022-01-06 DIAGNOSIS — M19041 Primary osteoarthritis, right hand: Secondary | ICD-10-CM | POA: Diagnosis not present

## 2022-01-06 DIAGNOSIS — M47816 Spondylosis without myelopathy or radiculopathy, lumbar region: Secondary | ICD-10-CM

## 2022-01-06 DIAGNOSIS — R5383 Other fatigue: Secondary | ICD-10-CM

## 2022-01-06 DIAGNOSIS — M19042 Primary osteoarthritis, left hand: Secondary | ICD-10-CM

## 2022-01-06 DIAGNOSIS — Z79899 Other long term (current) drug therapy: Secondary | ICD-10-CM | POA: Diagnosis not present

## 2022-01-06 DIAGNOSIS — M1711 Unilateral primary osteoarthritis, right knee: Secondary | ICD-10-CM

## 2022-01-06 DIAGNOSIS — M8589 Other specified disorders of bone density and structure, multiple sites: Secondary | ICD-10-CM

## 2022-01-06 DIAGNOSIS — Z8669 Personal history of other diseases of the nervous system and sense organs: Secondary | ICD-10-CM

## 2022-01-06 DIAGNOSIS — M5134 Other intervertebral disc degeneration, thoracic region: Secondary | ICD-10-CM

## 2022-01-06 DIAGNOSIS — G2581 Restless legs syndrome: Secondary | ICD-10-CM

## 2022-01-06 DIAGNOSIS — Z96652 Presence of left artificial knee joint: Secondary | ICD-10-CM

## 2022-01-06 DIAGNOSIS — Z8679 Personal history of other diseases of the circulatory system: Secondary | ICD-10-CM

## 2022-01-06 DIAGNOSIS — Z8719 Personal history of other diseases of the digestive system: Secondary | ICD-10-CM

## 2022-01-06 DIAGNOSIS — M5030 Other cervical disc degeneration, unspecified cervical region: Secondary | ICD-10-CM

## 2022-01-06 NOTE — Patient Instructions (Signed)
Standing Labs We placed an order today for your standing lab work.   Please have your standing labs drawn in November and every 3 months   If possible, please have your labs drawn 2 weeks prior to your appointment so that the provider can discuss your results at your appointment.  Please note that you may see your imaging and lab results in [place] before we have reviewed them. We may be awaiting multiple results to interpret others before contacting you. Please allow our office up to 72 hours to thoroughly review all of the results before contacting the office for clarification of your results.  We have open lab daily: Monday through Thursday from 1:30 PM-5:00 PM and Friday from 8:30 AM-12:00 PM at the office of Dr. Bo Merino, [facility].   Please be advised, all patients with office appointments requiring lab work will take precedent over walk-in lab work.  If possible, please come for your lab work on Monday and Thursday afternoons, as you may experience shorter wait times. The office is located at [address] [place], [place], [place] [address] No appointment is necessary.   Labs are drawn by [facility]. Please bring your co-pay at the time of your lab draw.  You may receive a bill from [facility] for your lab work.  Please note if you are on Hydroxychloroquine and and an order has been placed for a Hydroxychloroquine level, you will need to have it drawn 4 hours or more after your last dose.  If you wish to have your labs drawn at another location, please call the office 24 hours in advance to send orders.  If you have any questions regarding directions or hours of operation,  please call [phone].   As a reminder, please drink plenty of water prior to coming for your lab work. Thanks!  If you have signs or symptoms of an infection or start antibiotics: First, call your PCP for workup of your infection. Hold your medication through the infection, until you  complete your antibiotics, and until symptoms resolve if you take the following: Injectable medication (Actemra, Benlysta, Cimzia, Cosentyx, Enbrel, Humira, Kevzara, Orencia, Remicade, Simponi, Stelara, Taltz, Tremfya) Methotrexate Leflunomide (Arava) Mycophenolate (Cellcept) Morrie Sheldon, Olumiant, or Rinvoq

## 2022-01-07 LAB — COMPLETE METABOLIC PANEL WITH GFR
AG Ratio: 1.8 (calc) (ref 1.0–2.5)
ALT: 9 U/L (ref 6–29)
AST: 17 U/L (ref 10–35)
Albumin: 4 g/dL (ref 3.6–5.1)
Alkaline phosphatase (APISO): 96 U/L (ref 37–153)
BUN/Creatinine Ratio: 11 (calc) (ref 6–22)
BUN: 12 mg/dL (ref 7–25)
CO2: 28 mmol/L (ref 20–32)
Calcium: 9.6 mg/dL (ref 8.6–10.4)
Chloride: 94 mmol/L — ABNORMAL LOW (ref 98–110)
Creat: 1.14 mg/dL — ABNORMAL HIGH (ref 0.60–1.00)
Globulin: 2.2 g/dL (calc) (ref 1.9–3.7)
Glucose, Bld: 70 mg/dL (ref 65–99)
Potassium: 3.6 mmol/L (ref 3.5–5.3)
Sodium: 131 mmol/L — ABNORMAL LOW (ref 135–146)
Total Bilirubin: 0.4 mg/dL (ref 0.2–1.2)
Total Protein: 6.2 g/dL (ref 6.1–8.1)
eGFR: 51 mL/min/{1.73_m2} — ABNORMAL LOW (ref >=60)

## 2022-01-07 LAB — CBC WITH DIFFERENTIAL/PLATELET
Absolute Monocytes: 582 cells/uL (ref 200–950)
Basophils Absolute: 62 cells/uL (ref 0–200)
Basophils Relative: 1.2 %
Eosinophils Absolute: 88 cells/uL (ref 15–500)
Eosinophils Relative: 1.7 %
HCT: 34.7 % — ABNORMAL LOW (ref 35.0–45.0)
Hemoglobin: 11.5 g/dL — ABNORMAL LOW (ref 11.7–15.5)
Lymphs Abs: 1700 cells/uL (ref 850–3900)
MCH: 30.7 pg (ref 27.0–33.0)
MCHC: 33.1 g/dL (ref 32.0–36.0)
MCV: 92.8 fL (ref 80.0–100.0)
MPV: 10.3 fL (ref 7.5–12.5)
Monocytes Relative: 11.2 %
Neutro Abs: 2766 cells/uL (ref 1500–7800)
Neutrophils Relative %: 53.2 %
Platelets: 248 10*3/uL (ref 140–400)
RBC: 3.74 10*6/uL — ABNORMAL LOW (ref 3.80–5.10)
RDW: 13 % (ref 11.0–15.0)
Total Lymphocyte: 32.7 %
WBC: 5.2 10*3/uL (ref 3.8–10.8)

## 2022-01-07 NOTE — Progress Notes (Signed)
Creatinine is elevated-1.14 and GFR is low-51. Please advise the patient to avoid the use of NSAIDs.  Please clarify if she has had any medication changes.   Sodium and chloride are low.   RBC count, hgb, and hct are low but stable.  Rest of CBC WNL.

## 2022-01-12 ENCOUNTER — Other Ambulatory Visit: Payer: Self-pay | Admitting: Diagnostic Neuroimaging

## 2022-01-19 ENCOUNTER — Other Ambulatory Visit: Payer: Self-pay | Admitting: Family Medicine

## 2022-01-20 ENCOUNTER — Ambulatory Visit
Admission: RE | Admit: 2022-01-20 | Discharge: 2022-01-20 | Disposition: A | Discharge status: Home / Self Care | Payer: Medicare Other | Source: Ambulatory Visit | Attending: Neurological Surgery | Admitting: Neurological Surgery

## 2022-01-20 DIAGNOSIS — M4714 Other spondylosis with myelopathy, thoracic region: Secondary | ICD-10-CM

## 2022-01-27 ENCOUNTER — Other Ambulatory Visit: Payer: Self-pay | Admitting: Family Medicine

## 2022-01-27 ENCOUNTER — Telehealth: Payer: Self-pay

## 2022-01-27 MED ORDER — OXYCODONE-ACETAMINOPHEN 5-325 MG PO TABS: 1.0000 | ORAL_TABLET | Freq: Three times a day (TID) | ORAL | 0 refills | Status: DC | PRN

## 2022-01-27 NOTE — Telephone Encounter (Signed)
Pt called requesting RF on her Oxycodone. Last RF was 12/25/2021 and Last OV was 12/09/2021. Pt uses [facility]. Thank you.

## 2022-02-26 ENCOUNTER — Telehealth: Payer: Self-pay

## 2022-02-26 ENCOUNTER — Other Ambulatory Visit: Payer: Self-pay

## 2022-02-26 NOTE — Telephone Encounter (Signed)
Pt would like refill on Oxycodone. Last RF 01/27/2022. Last OV 10/13/2021. Pt would like RX sent to [facility].

## 2022-02-27 ENCOUNTER — Other Ambulatory Visit: Payer: Self-pay | Admitting: Family Medicine

## 2022-02-27 MED ORDER — OXYCODONE-ACETAMINOPHEN 5-325 MG PO TABS: 1.0000 | ORAL_TABLET | Freq: Three times a day (TID) | ORAL | 0 refills | Status: DC | PRN

## 2022-03-06 ENCOUNTER — Other Ambulatory Visit: Payer: Self-pay | Admitting: Family Medicine

## 2022-03-12 ENCOUNTER — Ambulatory Visit (INDEPENDENT_AMBULATORY_CARE_PROVIDER_SITE_OTHER): Payer: Medicare Other | Admitting: Family Medicine

## 2022-03-12 VITALS — BP 120/78 | HR 68 | Ht 63.0 in | Wt 200.0 lb

## 2022-03-12 DIAGNOSIS — K5900 Constipation, unspecified: Secondary | ICD-10-CM

## 2022-03-12 NOTE — Progress Notes (Signed)
Subjective:    Patient ID: Janet Peterson, female    DOB: 11/02/1950, 71 y.o.   MRN: [id number] Patient reports trouble with constipation.  States that she has been taking Dulcolax on a daily basis and not having any success.  She states she can go several days without having a bowel movement.  She reports feeling bloated and uncomfortable.  She is unable to tolerate liquid medication so she does not feel like she can take MiraLAX or milk of magnesia because it "makes her gag".  She has tried Colace as a stool softener without any success.  She is taking pain pills but she only takes 1 a day and therefore I do not feel that Movantik medications similar to that when beneficial. Past Medical History:  Diagnosis Date   Ankylosing spondylitis ([place])    Anxiety and depression    Depression    Fibromyalgia    GERD (gastroesophageal reflux disease)    Hiatal hernia    per patient, dx by GI    History of basal cell carcinoma excision    FACE, 1992  &  1996   History of hiatal hernia    History of thrush    Hyperlipidemia    Hypertension    Insomnia    Left breast mass    OCD (obsessive compulsive disorder)    Osteopenia    PONV (postoperative nausea and vomiting)    Psoriatic arthritis (HCC)    PVC (premature ventricular contraction)    Rheumatoid arthritis ([place])    Past Surgical History:  Procedure Laterality Date   BIOPSY  09/29/2019   Procedure: BIOPSY;  Surgeon: Ronnette Juniper, MD;  [facility];  Service: Gastroenterology;;   BREAST EXCISIONAL BIOPSY Right    BREAST EXCISIONAL BIOPSY Left 05/04/2018   BREAST LUMPECTOMY WITH RADIOACTIVE SEED LOCALIZATION Left 05/04/2018   Procedure: LEFT BREAST LUMPECTOMY WITH RADIOACTIVE SEED LOCALIZATION AND LEFT BREAST NIPPLE BIOPSY;  Surgeon: Jovita Kussmaul, MD;  Location: [place];  Service: General;  Laterality: Left;   BREAST SURGERY  05/25/1973   lumpectomy-- benign   BUNIONECTOMY  05/25/1989   CATARACT EXTRACTION W/  INTRAOCULAR LENS  IMPLANT, BILATERAL  05/25/1998   CERVICAL FUSION  07/24/2011   C5 -- C7   CESAREAN SECTION  05/26/1983   COLONOSCOPY WITH PROPOFOL N/A 09/29/2019   Procedure: COLONOSCOPY WITH PROPOFOL;  Surgeon: Ronnette Juniper, MD;  [facility];  Service: Gastroenterology;  Laterality: N/A;   DISTAL INTERPHALANGEAL JOINT FUSION Right 03/14/2015   Procedure: RIGHT LONG FINGER DISTAL INTERPHALANGEAL JOINT ARTHRODESIS;  Surgeon: Iran Planas, MD;  Location: [place];  Service: Orthopedics;  Laterality: Right;   ESOPHAGOGASTRODUODENOSCOPY (EGD) WITH PROPOFOL N/A 09/29/2019   Procedure: ESOPHAGOGASTRODUODENOSCOPY (EGD) WITH PROPOFOL;  Surgeon: Ronnette Juniper, MD;  [facility];  Service: Gastroenterology;  Laterality: N/A;   EYE SURGERY  05/25/1993   rk (laser surgery), semi cornea transplant, detacted retina,  fluid removal   HYSTEROSCOPY WITH D & C N/A 12/09/2020   Procedure: DILATATION AND CURETTAGE /HYSTEROSCOPY;  Surgeon: Drema Dallas, DO;  Location: [place];  Service: Gynecology;  Laterality: N/A;   KNEE ARTHROSCOPY Left 03/03/2004   POLYPECTOMY  09/29/2019   Procedure: POLYPECTOMY;  Surgeon: Ronnette Juniper, MD;  [facility];  Service: Gastroenterology;;   POSTERIOR VITRECTOMY RIGHT EYE AND LASER   10/27/1999   SHOULDER SURGERY Right 05/26/1995   SPINAL FIXATION SURGERY W/ IMPLANT  2013 rod #1//  2014  rod 2  S1 -- T10  (rod #1)//   S1 -- T4 (rod #2)   THORACIC FUSION  03/13/2013   REMOVAL HARDWARE/  BONE GRAFT FUSION T10//  REVISION OF RODS   TOTAL KNEE ARTHROPLASTY Left 12/14/2005   UVULOPALATOPHARYNGOPLASTY  04/26/2006   w/  TONSILLECTOMY/  TURBINATE REDUCTIONS/  BILATERAL ANTERIOR ETHMOIDECTOMY   Current Outpatient Medications on File Prior to Visit  Medication Sig Dispense Refill   buPROPion (WELLBUTRIN XL) 300 MG 24 hr tablet Take 300 mg by mouth every morning.  1   clonazePAM (KLONOPIN) 1 MG tablet TAKE 1 TABLET BY  MOUTH EVERY DAY AS NEEDED FOR ANXIETY (Patient taking differently: Take 1 mg by mouth daily as needed for anxiety.) 30 tablet 1   dexlansoprazole (DEXILANT) 60 MG capsule TAKE 1 CAPSULE BY MOUTH EVERY DAY 90 capsule 1   doxazosin (CARDURA) 2 MG tablet TAKE 1 TABLET BY MOUTH EVERY DAY 90 tablet 1   doxepin (SINEQUAN) 50 MG capsule Take 50 mg by mouth.     ezetimibe (ZETIA) 10 MG tablet TAKE 1 TABLET BY MOUTH EVERY DAY 90 tablet 3   FLUoxetine (PROZAC) 20 MG capsule Take 60 mg by mouth daily.     FLUoxetine (PROZAC) 40 MG capsule Take 40 mg by mouth daily.     fluticasone (FLONASE) 50 MCG/ACT nasal spray Place 2 sprays into both nostrils daily as needed for allergies. 16 g 11   hydrochlorothiazide (HYDRODIURIL) 25 MG tablet Take 1 tablet (25 mg total) by mouth daily. 90 tablet 3   INGREZZA 80 MG capsule Take one capsule by mouth daily 30 capsule 1   leflunomide (ARAVA) 20 MG tablet TAKE 1 TABLET BY MOUTH EVERY DAY 90 tablet 0   metoprolol succinate (TOPROL-XL) 50 MG 24 hr tablet TAKE 1 TABLET BY MOUTH ONCE DAILY FOLLOWING A MEAL 90 tablet 1   omeprazole (PRILOSEC) 40 MG capsule TAKE 1 CAPSULE BY MOUTH EVERY MORNING AND AT BEDTIME 180 capsule 1   ondansetron (ZOFRAN) 4 MG tablet Take 4 mg by mouth 4 (four) times daily as needed for nausea.     oxyCODONE-acetaminophen (PERCOCET) 5-325 MG tablet Take 1 tablet by mouth every 8 (eight) hours as needed for severe pain. 60 tablet 0   pregabalin (LYRICA) 100 MG capsule Take 1 capsule (100 mg total) by mouth 2 (two) times daily. (Patient taking differently: Take 100 mg by mouth daily.) 60 capsule 3   pregabalin (LYRICA) 75 MG capsule Take 1 capsule (75 mg total) by mouth 2 (two) times daily. 60 capsule 0   tiZANidine (ZANAFLEX) 4 MG capsule Take 1 capsule (4 mg total) by mouth 3 (three) times daily. 30 capsule 1   valsartan (DIOVAN) 320 MG tablet Take 1 tablet (320 mg total) by mouth daily. 90 tablet 3   No current facility-administered medications on file  prior to visit.   Allergies  Allergen Reactions   Latex Rash    And Mouth sores Other reaction(s): rash Other reaction(s): rash Other reaction(s): rash   Amlodipine Besy-Benazepril Hcl Other (See Comments)    COUGH   Amoxicillin-Pot Clavulanate Diarrhea and Nausea And Vomiting    Other reaction(s): sick on stomach Other reaction(s): sick on stomach   Bextra [Valdecoxib] Diarrhea and Nausea And Vomiting    REFLUX   Chlorhexidine     rash Other reaction(s): rash all over body Other reaction(s): rash all over body   Lipitor [Atorvastatin Calcium] Other (See Comments)    MYALGIAS   Sulfa Antibiotics Nausea And Vomiting  CRAMPING Other reaction(s): stomach hurts Other reaction(s): stomach hurts   Zocor [Simvastatin] Other (See Comments)    MYALGIA   Social History   Socioeconomic History   Marital status: Married    Spouse name: Daryl   Number of children: Not on file   Years of education: Not on file   Highest education level: Not on file  Occupational History    Comment: retired  Tobacco Use   Smoking status: Former    Packs/day: 0.50    Years: 10.00    Total pack years: 5.00    Types: Cigarettes    Quit date: 03/07/1995    Years since quitting: 27.0    Passive exposure: Never   Smokeless tobacco: Never  Vaping Use   Vaping Use: Never used  Substance and Sexual Activity   Alcohol use: Yes    Comment: rare   Drug use: Never   Sexual activity: Not on file  Other Topics Concern   Not on file  Social History Narrative   Lives with husband   Social Determinants of Health   Financial Resource Strain: Low Risk  (08/07/2021)   Overall Financial Resource Strain (CARDIA)    Difficulty of Paying Living Expenses: Not hard at all  Food Insecurity: No Food Insecurity (08/07/2021)   Hunger Vital Sign    Worried About Running Out of Food in the Last Year: Never true    [place] in the Last Year: Never true  Transportation Needs: No Transportation Needs  (08/07/2021)   PRAPARE - Hydrologist (Medical): No    Lack of Transportation (Non-Medical): No  Physical Activity: Inactive (08/07/2021)   Exercise Vital Sign    Days of Exercise per Week: 0 days    Minutes of Exercise per Session: 0 min  Stress: No Stress Concern Present (08/07/2021)   [place]    Feeling of Stress : Not at all  Social Connections: [place] (08/07/2021)   Social Connection and Isolation Panel [NHANES]    Frequency of Communication with Friends and Family: More than three times a week    Frequency of Social Gatherings with Friends and Family: More than three times a week    Attends Religious Services: More than 4 times per year    Active Member of [employer] or Organizations: Yes    Attends Archivist Meetings: More than 4 times per year    Marital Status: Married  Human resources officer Violence: Not At Risk (08/07/2021)   Humiliation, Afraid, Rape, and Kick questionnaire    Fear of Current or Ex-Partner: No    Emotionally Abused: No    Physically Abused: No    Sexually Abused: No    Review of Systems  Gastrointestinal:  Positive for abdominal pain.  All other systems reviewed and are negative.      Objective:   Physical Exam Vitals reviewed.  Constitutional:      General: She is not in acute distress.    Appearance: Normal appearance. She is normal weight. She is not ill-appearing or toxic-appearing.  HENT:     Right Ear: No swelling or tenderness.     Left Ear: No decreased hearing noted. Swelling and tenderness present.  No middle ear effusion. No foreign body. No mastoid tenderness.  Cardiovascular:     Rate and Rhythm: Normal rate and regular rhythm.     Heart sounds: Normal heart sounds. No murmur heard.  No friction rub. No gallop.  Pulmonary:     Effort: Pulmonary effort is normal. No respiratory distress.     Breath sounds: No wheezing,  rhonchi or rales.  Abdominal:     General: Abdomen is flat. Bowel sounds are normal. There is no distension.     Palpations: Abdomen is soft.     Tenderness: There is no abdominal tenderness.  Neurological:     Mental Status: She is alert.           Assessment & Plan:  Constipation, unspecified constipation type Patient is dealing with constipation.  I recommended that she add a daily stool softener.  Together we decided to try Linzess 145 mcg p.o. daily.  Also recommended using Senokot on a daily basis as a stimulant and see if this helps her symptoms.

## 2022-03-16 ENCOUNTER — Other Ambulatory Visit: Payer: Self-pay | Admitting: Diagnostic Neuroimaging

## 2022-03-18 ENCOUNTER — Ambulatory Visit (INDEPENDENT_AMBULATORY_CARE_PROVIDER_SITE_OTHER): Payer: Medicare Other

## 2022-03-18 DIAGNOSIS — Z23 Encounter for immunization: Secondary | ICD-10-CM | POA: Diagnosis not present

## 2022-03-23 ENCOUNTER — Telehealth: Payer: Self-pay

## 2022-03-23 NOTE — Telephone Encounter (Signed)
Pt called in stating that she needed a PA for this med INGREZZA 80 MG capsule [[id number]]. Please advise  Cb#: [phone]

## 2022-03-24 NOTE — Telephone Encounter (Signed)
PA sent to Cover My Meds. Response from Cover My Meds: Your information has been sent to [facility].  Awaiting approval. Pt advised.

## 2022-03-30 ENCOUNTER — Other Ambulatory Visit: Payer: Self-pay | Admitting: Rheumatology

## 2022-03-30 ENCOUNTER — Other Ambulatory Visit: Payer: Self-pay | Admitting: Family Medicine

## 2022-03-30 NOTE — Telephone Encounter (Signed)
Next Visit: 06/10/2022  Last Visit: 01/06/2022  Last Fill: 10/09/2021  DX: Psoriatic arthritis   Current Dose per office note 01/06/2022: Arava 20 mg 1 tablet by mouth daily   Labs: 01/06/2022 Creatinine is elevated-1.14 and GFR is low-51 Sodium and chloride are low.   RBC count, hgb, and hct are low but stable.  Rest of CBC WNL.    Okay to refill Arava?

## 2022-03-31 ENCOUNTER — Telehealth: Payer: Self-pay

## 2022-03-31 NOTE — Telephone Encounter (Signed)
Pt called requesting a refill on her Oxycodone 5/325. Last RF was 02/27/2022. [facility] OV 03/12/2022. Please send to [facility]. Thank you.

## 2022-04-02 ENCOUNTER — Other Ambulatory Visit: Payer: Self-pay | Admitting: Family Medicine

## 2022-04-02 MED ORDER — OXYCODONE-ACETAMINOPHEN 5-325 MG PO TABS: 1.0000 | ORAL_TABLET | Freq: Three times a day (TID) | ORAL | 0 refills | Status: DC | PRN

## 2022-04-03 ENCOUNTER — Telehealth: Payer: Self-pay | Admitting: Family Medicine

## 2022-04-03 NOTE — Telephone Encounter (Signed)
Received call from patient to follow up on call received from pharmacy (mail delivery) for  [facility] 80 MG capsule   Pharmacy needs a new script with refills.  Pharmacy confirmed as:   [facility], [place], [address], [address], [place] [address] Phone: [phone]  Fax: [phone]   Patient only has 5 pills left.    Please advise at [phone].

## 2022-04-03 NOTE — Telephone Encounter (Signed)
Called LVM for pt regarding refill request. Unable to refill due to med was not prescribe by Dr. Dennard Schaumann.

## 2022-04-07 ENCOUNTER — Ambulatory Visit (INDEPENDENT_AMBULATORY_CARE_PROVIDER_SITE_OTHER): Payer: Medicare Other | Admitting: Family Medicine

## 2022-04-07 VITALS — BP 120/80 | HR 68 | Ht 63.0 in | Wt 200.0 lb

## 2022-04-07 DIAGNOSIS — G2401 Drug induced subacute dyskinesia: Secondary | ICD-10-CM | POA: Diagnosis not present

## 2022-04-07 DIAGNOSIS — Z9189 Other specified personal risk factors, not elsewhere classified: Secondary | ICD-10-CM | POA: Diagnosis not present

## 2022-04-07 MED ORDER — VALBENAZINE TOSYLATE 80 MG PO CAPS: 80.0000 mg | ORAL_CAPSULE | Freq: Every day | ORAL | 3 refills | Status: DC

## 2022-04-07 MED ORDER — LINACLOTIDE 72 MCG PO CAPS: 72.0000 ug | ORAL_CAPSULE | Freq: Every day | ORAL | 3 refills | Status: DC

## 2022-04-07 NOTE — Progress Notes (Signed)
Subjective:    Patient ID: Janet Peterson, female    DOB: [birth year], 71 y.o.   MRN: [id number]    06/25/20 Patient never saw neurology.  She states that they never called her.  However thankfully, the nausea has improved.  She is not sure if she is taking the Reglan.  She does not believe that she is.  She states that she is not taking the Compazine anymore.  She is taking Seroquel at night.  She states that over the last several months she has been developed uncontrollable muscle movements particularly in her mouth and tongue.  She states that her tongue and her mouth which is start moving without her knowledge.  If she focuses she can control it and get it to stop however she will notice that is happening without her control.  She is also reports uncontrolled muscle movements in her feet.  She states that her feet will start twitching for no reason on both sides.  If she focuses again she can control it and stop it however when she is not focusing, she notices it.  The movements do not sound like jerking or tremor.  Instead they sound more like dyskinesia.  I suspect that this could be due to medications that she is taking such as the Compazine and Seroquel and Reglan  04/07/22  At that point, I referred the patient to neurology.  Her neurologist noticed her with tardive dyskinesia and has been treating her with ingrezza 80 mg daily.  She has had constipation but she denies any significant drop mouth.  She denies any palpitations or irregular heartbeats.  He does carry a risk of QT interval prolongation so I did check an EKG today which shows a normal QT interval, normal sinus rhythm with chronic T wave changes.  Patient states that the medication helps with the tardive dyskinesia in her mouth however she continues to have uncontrolled limb movements in her feet.  However she can live with this.  Past Medical History:  Diagnosis Date   Ankylosing spondylitis ([place])    Anxiety and depression     Depression    Fibromyalgia    GERD (gastroesophageal reflux disease)    Hiatal hernia    per patient, dx by GI    History of basal cell carcinoma excision    FACE, 1992  &  1996   History of hiatal hernia    History of thrush    Hyperlipidemia    Hypertension    Insomnia    Left breast mass    OCD (obsessive compulsive disorder)    Osteopenia    PONV (postoperative nausea and vomiting)    Psoriatic arthritis (HCC)    PVC (premature ventricular contraction)    Rheumatoid arthritis ([place])    Past Surgical History:  Procedure Laterality Date   BIOPSY  09/29/2019   Procedure: BIOPSY;  Surgeon: Ronnette Juniper, MD;  Location: [facility];  Service: Gastroenterology;;   BREAST EXCISIONAL BIOPSY Right    BREAST EXCISIONAL BIOPSY Left 05/04/2018   BREAST LUMPECTOMY WITH RADIOACTIVE SEED LOCALIZATION Left 05/04/2018   Procedure: LEFT BREAST LUMPECTOMY WITH RADIOACTIVE SEED LOCALIZATION AND LEFT BREAST NIPPLE BIOPSY;  Surgeon: Jovita Kussmaul, MD;  Location: [facility];  Service: General;  Laterality: Left;   BREAST SURGERY  05/25/1973   lumpectomy-- benign   BUNIONECTOMY  05/25/1989   CATARACT EXTRACTION W/ INTRAOCULAR LENS  IMPLANT, BILATERAL  05/25/1998   CERVICAL FUSION  07/24/2011   C5 --  C7   CESAREAN SECTION  05/26/1983   COLONOSCOPY WITH PROPOFOL N/A 09/29/2019   Procedure: COLONOSCOPY WITH PROPOFOL;  Surgeon: Ronnette Juniper, MD;  Location: [facility];  Service: Gastroenterology;  Laterality: N/A;   DISTAL INTERPHALANGEAL JOINT FUSION Right 03/14/2015   Procedure: RIGHT LONG FINGER DISTAL INTERPHALANGEAL JOINT ARTHRODESIS;  Surgeon: Iran Planas, MD;  Location: [facility];  Service: Orthopedics;  Laterality: Right;   ESOPHAGOGASTRODUODENOSCOPY (EGD) WITH PROPOFOL N/A 09/29/2019   Procedure: ESOPHAGOGASTRODUODENOSCOPY (EGD) WITH PROPOFOL;  Surgeon: Ronnette Juniper, MD;  Location: [facility];  Service: Gastroenterology;  Laterality: N/A;   EYE SURGERY   05/25/1993   rk (laser surgery), semi cornea transplant, detacted retina,  fluid removal   HYSTEROSCOPY WITH D & C N/A 12/09/2020   Procedure: DILATATION AND CURETTAGE /HYSTEROSCOPY;  Surgeon: Drema Dallas, DO;  Location: [facility];  Service: Gynecology;  Laterality: N/A;   KNEE ARTHROSCOPY Left 03/03/2004   POLYPECTOMY  09/29/2019   Procedure: POLYPECTOMY;  Surgeon: Ronnette Juniper, MD;  Location: [facility];  Service: Gastroenterology;;   POSTERIOR VITRECTOMY RIGHT EYE AND LASER   10/27/1999   SHOULDER SURGERY Right 05/26/1995   SPINAL FIXATION SURGERY W/ IMPLANT  2013 rod #1//  2014  rod 2   S1 -- T10  (rod #1)//   S1 -- T4 (rod #2)   THORACIC FUSION  03/13/2013   REMOVAL HARDWARE/  BONE GRAFT FUSION T10//  REVISION OF RODS   TOTAL KNEE ARTHROPLASTY Left 12/14/2005   UVULOPALATOPHARYNGOPLASTY  04/26/2006   w/  TONSILLECTOMY/  TURBINATE REDUCTIONS/  BILATERAL ANTERIOR ETHMOIDECTOMY   Current Outpatient Medications on File Prior to Visit  Medication Sig Dispense Refill   buPROPion (WELLBUTRIN XL) 300 MG 24 hr tablet Take 300 mg by mouth every morning.  1   clonazePAM (KLONOPIN) 1 MG tablet TAKE 1 TABLET BY MOUTH EVERY DAY AS NEEDED FOR ANXIETY (Patient taking differently: Take 1 mg by mouth daily as needed for anxiety.) 30 tablet 1   dexlansoprazole (DEXILANT) 60 MG capsule TAKE 1 CAPSULE BY MOUTH EVERY DAY 90 capsule 1   doxazosin (CARDURA) 2 MG tablet TAKE 1 TABLET BY MOUTH EVERY DAY 90 tablet 1   doxepin (SINEQUAN) 50 MG capsule Take 50 mg by mouth.     ezetimibe (ZETIA) 10 MG tablet TAKE 1 TABLET BY MOUTH EVERY DAY 90 tablet 3   FLUoxetine (PROZAC) 20 MG capsule Take 60 mg by mouth daily.     FLUoxetine (PROZAC) 40 MG capsule Take 40 mg by mouth daily.     fluticasone (FLONASE) 50 MCG/ACT nasal spray Place 2 sprays into both nostrils daily as needed for allergies. 16 g 11   hydrochlorothiazide (HYDRODIURIL) 25 MG tablet TAKE 1 TABLET (25 MG TOTAL) BY MOUTH DAILY. 90  tablet 3   INGREZZA 80 MG capsule Take one capsule by mouth daily 30 capsule 1   leflunomide (ARAVA) 20 MG tablet TAKE 1 TABLET BY MOUTH EVERY DAY 90 tablet 0   metoprolol succinate (TOPROL-XL) 50 MG 24 hr tablet TAKE 1 TABLET BY MOUTH ONCE DAILY FOLLOWING A MEAL 90 tablet 1   omeprazole (PRILOSEC) 40 MG capsule TAKE 1 CAPSULE BY MOUTH EVERY MORNING AND AT BEDTIME 180 capsule 1   ondansetron (ZOFRAN) 4 MG tablet Take 4 mg by mouth 4 (four) times daily as needed for nausea.     oxyCODONE-acetaminophen (PERCOCET) 5-325 MG tablet Take 1 tablet by mouth every 8 (eight) hours as needed for severe pain. 60 tablet 0   pregabalin (  LYRICA) 100 MG capsule Take 1 capsule (100 mg total) by mouth 2 (two) times daily. (Patient taking differently: Take 100 mg by mouth daily.) 60 capsule 3   pregabalin (LYRICA) 75 MG capsule Take 1 capsule (75 mg total) by mouth 2 (two) times daily. 60 capsule 0   tiZANidine (ZANAFLEX) 4 MG capsule Take 1 capsule (4 mg total) by mouth 3 (three) times daily. 30 capsule 1   valsartan (DIOVAN) 320 MG tablet Take 1 tablet (320 mg total) by mouth daily. 90 tablet 3   No current facility-administered medications on file prior to visit.   Allergies  Allergen Reactions   Latex Rash    And Mouth sores Other reaction(s): rash Other reaction(s): rash Other reaction(s): rash   Amlodipine Besy-Benazepril Hcl Other (See Comments)    COUGH   Amoxicillin-Pot Clavulanate Diarrhea and Nausea And Vomiting    Other reaction(s): sick on stomach Other reaction(s): sick on stomach   Bextra [Valdecoxib] Diarrhea and Nausea And Vomiting    REFLUX   Chlorhexidine     rash Other reaction(s): rash all over body Other reaction(s): rash all over body   Lipitor [Atorvastatin Calcium] Other (See Comments)    MYALGIAS   Sulfa Antibiotics Nausea And Vomiting    CRAMPING Other reaction(s): stomach hurts Other reaction(s): stomach hurts   Zocor [Simvastatin] Other (See Comments)    MYALGIA    Social History   Socioeconomic History   Marital status: Married    Spouse name: Daryl   Number of children: Not on file   Years of education: Not on file   Highest education level: Not on file  Occupational History    Comment: retired  Tobacco Use   Smoking status: Former    Packs/day: 0.50    Years: 10.00    Total pack years: 5.00    Types: Cigarettes    Quit date: 03/07/1995    Years since quitting: 27.1    Passive exposure: Never   Smokeless tobacco: Never  Vaping Use   Vaping Use: Never used  Substance and Sexual Activity   Alcohol use: Yes    Comment: rare   Drug use: Never   Sexual activity: Not on file  Other Topics Concern   Not on file  Social History Narrative   Lives with husband   Social Determinants of Health   Financial Resource Strain: Low Risk  (08/07/2021)   Overall Financial Resource Strain (CARDIA)    Difficulty of Paying Living Expenses: Not hard at all  Food Insecurity: No Food Insecurity (08/07/2021)   Hunger Vital Sign    Worried About Running Out of Food in the Last Year: Never true    Ran Out of Food in the Last Year: Never true  Transportation Needs: No Transportation Needs (08/07/2021)   PRAPARE - Hydrologist (Medical): No    Lack of Transportation (Non-Medical): No  Physical Activity: Inactive (08/07/2021)   Exercise Vital Sign    Days of Exercise per Week: 0 days    Minutes of Exercise per Session: 0 min  Stress: No Stress Concern Present (08/07/2021)   [place]    Feeling of Stress : Not at all  Social Connections: [place] (08/07/2021)   Social Connection and Isolation Panel [NHANES]    Frequency of Communication with Friends and Family: More than three times a week    Frequency of Social Gatherings with Friends and Family: More than three  times a week    Attends Religious Services: More than 4 times per year    Active  Member of Clubs or Organizations: Yes    Attends Archivist Meetings: More than 4 times per year    Marital Status: Married  Human resources officer Violence: Not At Risk (08/07/2021)   Humiliation, Afraid, Rape, and Kick questionnaire    Fear of Current or Ex-Partner: No    Emotionally Abused: No    Physically Abused: No    Sexually Abused: No    Review of Systems  Gastrointestinal:  Positive for abdominal pain.  All other systems reviewed and are negative.      Objective:   Physical Exam Vitals reviewed.  Constitutional:      General: She is not in acute distress.    Appearance: Normal appearance. She is normal weight. She is not ill-appearing or toxic-appearing.  Cardiovascular:     Rate and Rhythm: Normal rate and regular rhythm.     Heart sounds: Normal heart sounds. No murmur heard.    No friction rub. No gallop.  Pulmonary:     Effort: Pulmonary effort is normal. No respiratory distress.     Breath sounds: Normal breath sounds. No wheezing, rhonchi or rales.  Abdominal:     General: Abdomen is flat. Bowel sounds are normal. There is no distension.     Palpations: Abdomen is soft.     Tenderness: There is no abdominal tenderness.  Musculoskeletal:     Right lower leg: No edema.     Left lower leg: No edema.  Neurological:     Mental Status: She is alert.           Assessment & Plan:  At risk for prolonged QT interval syndrome - Plan: EKG 12-Lead  Tardive dyskinesia  Continue ingrezza 80 mg poqday.  EKG today shows no evidence of QT interval prolongation.  The patient denies any significant side effects on the medication.

## 2022-04-15 ENCOUNTER — Other Ambulatory Visit: Payer: Self-pay | Admitting: Family Medicine

## 2022-04-15 DIAGNOSIS — Z1231 Encounter for screening mammogram for malignant neoplasm of breast: Secondary | ICD-10-CM

## 2022-04-27 NOTE — Progress Notes (Addendum)
Chronic Care Management Pharmacy Note  05/01/2022 Name:  Janet Peterson MRN:  [id number] DOB:  1951/04/07  Summary: PharmD FU - started back on HCTZ, sodium normalized.  BP has been excellent the last few OVs  Recommendations/Changes made from today's visit: Monitor BP - caution with doxazosin and dizziness  Plan: FU 6 months   Subjective: Janet Peterson is an 71 y.o. year old female who is a primary patient of Pickard, Cammie Mcgee, MD.  The CCM team was consulted for assistance with disease management and care coordination needs.    Engaged with patient by telephone for follow up visit in response to provider referral for pharmacy case management and/or care coordination services.   Consent to Services:  The patient was given the following information about Chronic Care Management services today, agreed to services, and gave verbal consent: 1. CCM service includes personalized support from designated clinical staff supervised by the primary care provider, including individualized plan of care and coordination with other care providers 2. 24/7 contact phone numbers for assistance for urgent and routine care needs. 3. Service will only be billed when office clinical staff spend 20 minutes or more in a month to coordinate care. 4. Only one practitioner may furnish and bill the service in a calendar month. 5.The patient may stop CCM services at any time (effective at the end of the month) by phone call to the office staff. 6. The patient will be responsible for cost sharing (co-pay) of up to 20% of the service fee (after annual deductible is met). Patient agreed to services and consent obtained.  Patient Care Team: Susy Frizzle, MD as PCP - General (Family Medicine) Bo Merino, MD as Consulting Physician (Rheumatology) Edythe Clarity, [facility] as Pharmacist (Pharmacist)  Recent office visits:  01/31/21 Mirna Mires, MD - [place] - cyclobenzaprine (FLEXERIL) 10 MG tablet,  hydrochlorothiazide (HYDRODIURIL) 25 MG tablet, ipratropium (ATROVENT) 0.06 % nasal spray, triamcinolone cream (KENALOG) 0.1 % were prescribed at this visit. Follow up as needed or no improvement.     Recent consult visits:  03/11/21 Alysia Penna, MD - Cervical Dystonia- Physical Medicine - No medication changes. Will follow up in 4 weeks for Botox to left side of neck.   02/27/21 Hazel Sams, PA-C - Psoriatic arthritis - No medication changes. Return in 5 months.    02/11/21 Alysia Penna, MD - Physical Medicine - Primary Osteoarthritis - Right first carpometacarpal joint injection under ultrasound guidance without complication. Follow up in 3 weeks.    12/17/20 Darcus Pester, MD - Harriet Pho disease - Right first extensor compartment injection Indication stenosing tenosynovitis right APL right EPB De Quervain's syndrome right side without complication. After care instructions given. Follow up in 4 weeks for Right first carpometacarpal injection under ultrasound guidance.   11/27/20 Janeth Rase, NP - Major Depressive Disorder - No notes available.    11/26/20 [place] - Pre procedure - No notes available.    11/05/20 Darcus Pester, MD - OBGYN - Myofascial pai syndrome of thoracic spine - Referral placed to physical therapy. Trigger Point Injection Bilateral middle trap, Rhomboid major, infraspinatus completed in office without complication. After care instructions were given. Follow up as indicated.    10/23/20 [place] - Abnormal findings on diagnostic imaging - No notes available.    10/17/20 Ronnette Juniper - Abnormal findings on diagnostic imaging of other abdominal regions - No notes available.    10/17/20 Lavonia Dana - Diagnostic Radiology - Endometrial  hyperplasia - No noted available.      Hospital visits: 12/09/20   Medication Reconciliation was completed by comparing discharge summary, patient's EMR and Pharmacy list, and upon discussion with  patient.   Admitted to the hospital on 12/09/20 due to Thickened Endometrium. Discharge date was 12/09/20. Discharged from [place]?Medications Started at [facility] Discharge:?? oxyCODONE-acetaminophen (PERCOCET) 5-325 MG tablet 1 tablet at bedtime.    Medication Changes at Hospital Discharge: No changes noted.    Medications Discontinued at Hospital Discharge: No medications were discontinued at discharge.    Medications that remain the same after Hospital Discharge:??  All other medications will remain the same.     Objective:  Lab Results  Component Value Date   CREATININE 1.14 (H) 01/06/2022   BUN 12 01/06/2022   GFRNONAA 70 09/26/2020   GFRAA 82 09/26/2020   NA 131 (L) 01/06/2022   K 3.6 01/06/2022   CALCIUM 9.6 01/06/2022   CO2 28 01/06/2022   GLUCOSE 70 01/06/2022    Lab Results  Component Value Date/Time   HGBA1C 5.7 (H) 06/04/2021 10:30 AM   HGBA1C 6.1 (H) 02/19/2020 03:23 PM    Last diabetic Eye exam: No results found for: "HMDIABEYEEXA"  Last diabetic Foot exam: No results found for: "HMDIABFOOTEX"   Lab Results  Component Value Date   CHOL 191 06/04/2021   HDL 58 06/04/2021   LDLCALC 108 (H) 06/04/2021   TRIG 133 06/04/2021   CHOLHDL 3.3 06/04/2021       Latest Ref Rng & Units 01/06/2022    2:47 PM 09/08/2021    3:26 PM 06/04/2021   10:30 AM  Hepatic Function  Total Protein 6.1 - 8.1 g/dL 6.2  6.5  6.0   AST 10 - 35 U/L _0 ALT 6 - 29 U/L _1 Total Bilirubin 0.2 - 1.2 mg/dL 0.4  0.3  0.4     Lab Results  Component Value Date/Time   TSH 1.55 06/05/2019 04:27 PM   TSH 0.550 02/11/2015 12:53 PM       Latest Ref Rng & Units 01/06/2022    2:47 PM 09/08/2021    3:26 PM 06/04/2021   10:30 AM  CBC  WBC 3.8 - 10.8 Thousand/uL 5.2  6.6  4.6   Hemoglobin 11.7 - 15.5 g/dL 11.5  11.6  10.8   Hematocrit 35.0 - 45.0 % 34.7  35.9  32.7   Platelets 140 - 400 Thousand/uL 248  252  214     Lab Results  Component Value  Date/Time   VD25OH 32 02/24/2018 04:15 PM   VD25OH 45 04/20/2017 04:14 PM    Clinical ASCVD: No  The 10-year ASCVD risk score (Arnett DK, et al., 2019) is: 12.7%   Values used to calculate the score:     Age: 78 years     Sex: Female     Is Non-Hispanic African American: No     Diabetic: No     Tobacco smoker: No     Systolic Blood Pressure: 956 mmHg     Is BP treated: Yes     HDL Cholesterol: 58 mg/dL     Total Cholesterol: 191 mg/dL       04/30/2022    2:28 PM 04/07/2022    3:43 PM 03/12/2022   12:19 PM  Depression screen PHQ 2/9  Decreased Interest 1 0 0  Down, Depressed, Hopeless 1 0 0  PHQ - 2 Score 2 0 0  Altered sleeping 1    Tired, decreased energy 3    Change in appetite 0    Feeling bad or failure about yourself  0    Trouble concentrating 1    Moving slowly or fidgety/restless 0    Suicidal thoughts 0    PHQ-9 Score 7    Difficult doing work/chores Very difficult      Social History   Tobacco Use  Smoking Status Former   Packs/day: 0.50   Years: 10.00   Total pack years: 5.00   Types: Cigarettes   Quit date: 03/07/1995   Years since quitting: 27.1   Passive exposure: Never  Smokeless Tobacco Never   BP Readings from Last 3 Encounters:  04/30/22 116/76  04/07/22 120/80  03/12/22 120/78   Pulse Readings from Last 3 Encounters:  04/30/22 72  04/07/22 68  03/12/22 68   Wt Readings from Last 3 Encounters:  04/30/22 201 lb (91.2 kg)  04/07/22 200 lb (90.7 kg)  03/12/22 200 lb (90.7 kg)   BMI Readings from Last 3 Encounters:  04/30/22 35.61 kg/m  04/07/22 35.43 kg/m  03/12/22 35.43 kg/m    Assessment/Interventions: Review of patient past medical history, allergies, medications, health status, including review of consultants reports, laboratory and other test data, was performed as part of comprehensive evaluation and provision of chronic care management services.   SDOH:  (Social Determinants of Health) assessments and interventions  performed: No, done within the past year Financial Resource Strain: Low Risk  (08/07/2021)   Overall Financial Resource Strain (CARDIA)    Difficulty of Paying Living Expenses: Not hard at all   Food Insecurity: No Food Insecurity (08/07/2021)   Hunger Vital Sign    Worried About Running Out of Food in the Last Year: Never true    Ran Out of Food in the Last Year: Never true    SDOH Interventions    Flowsheet Row Clinical Support from 08/07/2021 in [place] from 10/10/2020 in [facility] Visit from 09/10/2016 in [facility] Interventions     Food Insecurity Interventions Intervention Not Indicated Intervention Not Indicated --  Housing Interventions Intervention Not Indicated Intervention Not Indicated --  Transportation Interventions Intervention Not Indicated Intervention Not Indicated --  Depression Interventions/Treatment  -- -- Currently on Treatment  Financial Strain Interventions Intervention Not Indicated Intervention Not Indicated --  Physical Activity Interventions Patient Refused, Other (Comments)  [Encourged chair exercises.] Intervention Not Indicated --  Stress Interventions Intervention Not Indicated Intervention Not Indicated --  Social Connections Interventions Intervention Not Indicated Intervention Not Indicated --      Financial Resource Strain: Low Risk  (08/07/2021)   Overall Financial Resource Strain (CARDIA)    Difficulty of Paying Living Expenses: Not hard at all    [place]: No Food Insecurity (08/07/2021)  Housing: [place]  (08/07/2021)  Transportation Needs: No Transportation Needs (08/07/2021)  Alcohol Screen: Low Risk  (08/07/2021)  Depression (PHQ2-9): Medium Risk (04/30/2022)  Financial Resource Strain: Low Risk  (08/07/2021)  Physical Activity: Inactive (08/07/2021)  Social Connections: Socially Integrated (08/07/2021)  Stress: No Stress Concern  Present (08/07/2021)  Tobacco Use: Medium Risk (04/30/2022)    CCM Care Plan  Allergies  Allergen Reactions   Latex Rash    And Mouth sores Other reaction(s): rash Other reaction(s): rash Other reaction(s): rash   Amlodipine Besy-Benazepril Hcl Other (See Comments)  COUGH   Amoxicillin-Pot Clavulanate Diarrhea and Nausea And Vomiting    Other reaction(s): sick on stomach Other reaction(s): sick on stomach   Bextra [Valdecoxib] Diarrhea and Nausea And Vomiting    REFLUX   Chlorhexidine     rash Other reaction(s): rash all over body Other reaction(s): rash all over body   Lipitor [Atorvastatin Calcium] Other (See Comments)    MYALGIAS   Sulfa Antibiotics Nausea And Vomiting    CRAMPING Other reaction(s): stomach hurts Other reaction(s): stomach hurts   Zocor [Simvastatin] Other (See Comments)    MYALGIA    Medications Reviewed Today     Reviewed by Edythe Clarity, [facility] (Pharmacist) on 05/01/22 at 1415  Med List Status: <None>   Medication Order Taking? Sig Documenting Provider Last Dose Status Informant  buPROPion (WELLBUTRIN XL) 300 MG 24 hr tablet [id number] Yes Take 300 mg by mouth every morning. [provider] Taking Active            Med Note Jacobo Forest, Leilani Able   Wed Jan 15, 2021 10:23 AM) For depression  ciprofloxacin (CILOXAN) 0.3 % ophthalmic solution [id number] Yes Place 2 drops into the right eye every 2 (two) hours. Administer 1 drop, every 2 hours, while awake, for 2 days. Then 1 drop, every 4 hours, while awake, for the next 5 days. Rubie Maid, FNP Taking Active   clonazePAM (KLONOPIN) 1 MG tablet [id number] Yes TAKE 1 TABLET BY MOUTH EVERY DAY AS NEEDED FOR ANXIETY  Patient taking differently: Take 1 mg by mouth daily as needed for anxiety.   Susy Frizzle, MD Taking Active            Med Note Caro Hight   Tue Nov 05, 2020  2:20 PM) Fill date 10/27/20 #30 today # 21 last taken last pm  dexlansoprazole ([place]) 60 MG capsule  [id number] Yes TAKE 1 CAPSULE BY MOUTH EVERY DAY Susy Frizzle, MD Taking Active   doxazosin (CARDURA) 2 MG tablet [id number] Yes TAKE 1 TABLET BY MOUTH EVERY DAY Susy Frizzle, MD Taking Active   doxepin (SINEQUAN) 50 MG capsule [id number] Yes Take 50 mg by mouth. [provider] Taking Active            Med Note Jacobo Forest, Leilani Able   Wed Jan 15, 2021 10:23 AM) Nightly for sleep  ezetimibe (ZETIA) 10 MG tablet [id number] Yes TAKE 1 TABLET BY MOUTH EVERY DAY Susy Frizzle, MD Taking Active   FLUoxetine (PROZAC) 20 MG capsule [id number] Yes Take 60 mg by mouth daily. [provider] Taking Active            Med Note Langston Masker, Lavetta Nielsen Apr 07, 2022  3:40 PM) Patient is taking 20 mg TID.   FLUoxetine (PROZAC) 40 MG capsule [id number] Yes Take 40 mg by mouth daily. [provider] Taking Active   fluticasone (FLONASE) 50 MCG/ACT nasal spray [id number] Yes Place 2 sprays into both nostrils daily as needed for allergies. Susy Frizzle, MD Taking Active   hydrochlorothiazide (HYDRODIURIL) 25 MG tablet [id number] Yes TAKE 1 TABLET (25 MG TOTAL) BY MOUTH DAILY. Susy Frizzle, MD Taking Active   hydrocortisone cream 1 % [id number] Yes Apply 1 Application topically 2 (two) times daily. Rubie Maid, FNP Taking Active   INGREZZA 80 MG capsule [id number] Yes Take one capsule by mouth daily Penumalli, Earlean Polka, MD Taking Active   leflunomide (ARAVA) 20 MG tablet [id number] Yes TAKE 1  TABLET BY MOUTH EVERY DAY Ofilia Neas, PA-C Taking Active   linaclotide Rolan Lipa) 72 MCG capsule [id number] Yes Take 1 capsule (72 mcg total) by mouth daily before breakfast. Susy Frizzle, MD Taking Active   metoprolol succinate (TOPROL-XL) 50 MG 24 hr tablet [id number] Yes TAKE 1 TABLET BY MOUTH ONCE DAILY FOLLOWING A MEAL Susy Frizzle, MD Taking Active   omeprazole (PRILOSEC) 40 MG capsule [id number] Yes TAKE 1 CAPSULE BY MOUTH EVERY MORNING AND AT BEDTIME Susy Frizzle, MD  Taking Active   ondansetron (ZOFRAN) 4 MG tablet [id number] Yes Take 4 mg by mouth 4 (four) times daily as needed for nausea. [provider] Taking Active   oxyCODONE-acetaminophen (PERCOCET) 5-325 MG tablet [id number] Yes Take 1 tablet by mouth every 8 (eight) hours as needed for severe pain. Susy Frizzle, MD Taking Active   pregabalin (LYRICA) 100 MG capsule [id number] Yes Take 1 capsule (100 mg total) by mouth 2 (two) times daily.  Patient taking differently: Take 100 mg by mouth daily.   Susy Frizzle, MD Taking Active   pregabalin (LYRICA) 75 MG capsule [id number] Yes Take 1 capsule (75 mg total) by mouth 2 (two) times daily. Susy Frizzle, MD Taking Active   tiZANidine (ZANAFLEX) 4 MG capsule [id number] Yes Take 1 capsule (4 mg total) by mouth 3 (three) times daily. Susy Frizzle, MD Taking Active   valbenazine [facility]) 80 MG capsule [id number] Yes Take 1 capsule (80 mg total) by mouth daily. Susy Frizzle, MD Taking Active   valsartan (DIOVAN) 320 MG tablet [id number] Yes Take 1 tablet (320 mg total) by mouth daily. Susy Frizzle, MD Taking Active             Patient Active Problem List   Diagnosis Date Noted   Bacterial conjunctivitis of right eye 04/30/2022   Contact dermatitis 04/30/2022   Lesion of liver 06/20/2021   C. difficile colitis 09/29/2019   Colitis 09/28/2019   Anxiety and depression 09/28/2019   DDD (degenerative disc disease), cervical s/p fusion 11/12/2016   H/O total knee replacement, left 05/06/2016   DDD lumbar spine status post fusion 05/06/2016   Psoriasis 05/05/2016   High risk medication use 05/05/2016   Cervical post-laminectomy syndrome 12/09/2015   Thoracic postlaminectomy syndrome 12/09/2015   Psoriatic arthritis ([place]) 08/23/2012   Osteopenia 08/23/2012   HLD (hyperlipidemia) 08/23/2012   RLS (restless legs syndrome) 08/23/2012   Insomnia 08/23/2012   Fibromyalgia syndrome 08/12/2012   Low back pain 08/12/2012    Syncope    PVC (premature ventricular contraction)    OCD (obsessive compulsive disorder)    GERD (gastroesophageal reflux disease)    Hypertension     Immunization History  Administered Date(s) Administered   Fluad Quad(high Dose 65+) 01/26/2019, 03/05/2020, 03/18/2022   Influenza Whole 03/04/2012   Influenza, High Dose Seasonal PF 03/25/2018   Influenza,inj,Quad PF,6+ Mos 02/28/2013, 03/07/2014, 02/28/2015, 03/27/2016, 03/09/2017   Influenza-Unspecified 04/08/2021   Pneumococcal Conjugate-13 09/10/2016   Pneumococcal Polysaccharide-23 03/12/2006, 11/09/2011   Pneumococcal-Unspecified 08/31/2016   Unspecified SARS-COV-2 Vaccination 08/07/2019, 08/28/2019, 03/08/2020   Zoster Recombinat (Shingrix) 08/12/2021, 11/21/2021    Conditions to be addressed/monitored:  HTN, PVC, GERD, Osteopenia, Fibromyalgia, HLD, RLS, Depression/Anxiety  Care Plan : General Pharmacy (Adult)  Updates made by Edythe Clarity, RPH since 05/01/2022 12:00 AM     Problem: HTN, PVC, GERD, Osteopenia, Fibromyalgia, HLD, RLS, Depression/Anxiety   Priority: High  Onset Date: 04/10/2021     Long-Range Goal: Patient-Specific Goal  Start Date: 04/10/2021  Expected End Date: 10/08/2021  Recent Progress: On track  Priority: High  Note:   Current Barriers:  Elevated LDL - no recent labs Fibromyalgia pain  Pharmacist Clinical Goal(s):  Patient will achieve improvement in LDL/pain as evidenced by labs and symotoms through collaboration with PharmD and provider.   Interventions: 1:1 collaboration with Susy Frizzle, MD regarding development and update of comprehensive plan of care as evidenced by provider attestation and co-signature Inter-disciplinary care team collaboration (see longitudinal plan of care) Comprehensive medication review performed; medication list updated in electronic medical record  Hypertension/PVC (BP goal <140/90) 05/01/22 -Controlled -Current treatment: HCTZ 81m daily  Appropriate, Effective, Query Safe,  Toprol XL 521mdaily Appropriate, Effective, Safe, Accessible Valsartan 32035maily Appropriate, Effective, Safe, Accessible Doxazosin 2mg51mpropriate, Query effective, ,  -Medications previously tried: none noted  -Current home readings: not checking at home -Current dietary habits: eats two meals per day usually -Current exercise habits: minimal "walk from car to restaurant" -Denies hypotensive/hypertensive symptoms -Educated on BP goals and benefits of medications for prevention of heart attack, stroke and kidney damage; -BP has been controlled per her report.  Denies any dizziness at home - no recent falls. -Continue to monitor for dizziness with doxazosin.  Update 10/23/21 BP had been elevated a little recently so she was started on doxazosin.  She is not monitoring BP at home currently but does have a monitor.  Denies headache or dizziness. Exercise is unchanged.  Currently monitoring her sodium and has stopped HCTZ during the process. Will FU on BMP tomorrow. Would prefer her to be able to start back on HCTZ due to elevated BP. No changes at this time, adjust based off BMP.   Hyperlipidemia: (LDL goal < 100) 05/01/22 -Not ideally controlled, LDL 108 -Current treatment: Zetia 10mg76mly Appropriate, Query effective, ,  -Medications previously tried: atorvastatin (myalgias)  -Educated on Cholesterol goals;  Benefits of statin for ASCVD risk reduction; Most recent lipids from 2018.  Patient has intolerance to statins. -Lipids are up to date, borderline to goal. Past intolerance to statin medications. No changes at this time - continue Zetia and routine lipid screenings.  Update 10/23/21 LDL was slightly elevated at last lipid panel, however, she was not fasting per her reports. Inaccurate reading, would recommend recheck at next OV for physical. Has previous insensitivity to statins due to myalgias. Would think that LDL would be < 100 if  she fasted. No changes at this time - continue to monitor.  Osteopenia (Goal Maintain bone density/prevent fractures) -Controlled, not assessed -Last DEXA Scan: 04/02/21   T-Score femoral neck: -1.3 -Patient is not a candidate for pharmacologic treatment -Current treatment  None at this time -Medications previously tried: none noted  -Recommend weight-bearing and muscle strengthening exercises for building and maintaining bone density. -Recommended continue current management, repeat DEXA in two years.  Fibromyalgia (Goal: Manage pain) -Not ideally controlled -Current treatment  Oxycodone/APAP 5-325mg 85mrn -Medications previously tried: gabapentin per patient - no effect -Pain on daily basis, she tries to take med once daily, however she does occasionally have to take one during the day.  Counseled on managing pain -Recommended to continue current medication  Patient Goals/Self-Care Activities Patient will:  - take medications as prescribed as evidenced by patient report and record review focus on medication adherence by pill box check blood pressure periodically, document, and provide at future appointments  Follow Up Plan: The care management team will reach out to the patient again over  the next 180 days.              Medication Assistance: None required.  Patient affirms current coverage meets needs.  Compliance/Adherence/Medication fill history: Care Gaps: Pneumonia vaccine  Star-Rating Drugs: 03/05/22 Valsartan 347m 90ds  Patient's preferred pharmacy is:  [facility] [address] [address], [address] [address]Phone: [phone]: [phone]  [id number] [place] [id number]: [phone]: [phone] Uses pill box? Yes Pt endorses 100% compliance  We discussed:  Benefits of medication synchronization, packaging and delivery as well as enhanced pharmacist oversight with [facility]. Patient decided to: Utilize [facility] pharmacy for medication synchronization, packaging and delivery  Care Plan and Follow Up Patient Decision:  Patient agrees to Care Plan and Follow-up.  Plan: The care management team will reach out to the patient again over the next 180 days.  ChBeverly MilchPharmD, CPP Clinical Pharmacist Practitioner [phone]

## 2022-04-30 ENCOUNTER — Ambulatory Visit: Payer: Medicare Other | Admitting: Pharmacist

## 2022-04-30 ENCOUNTER — Encounter: Payer: Self-pay | Admitting: Family Medicine

## 2022-04-30 ENCOUNTER — Ambulatory Visit (INDEPENDENT_AMBULATORY_CARE_PROVIDER_SITE_OTHER): Payer: Medicare Other | Admitting: Family Medicine

## 2022-04-30 VITALS — BP 116/76 | HR 72 | Temp 97.4°F | Ht 63.0 in | Wt 201.0 lb

## 2022-04-30 DIAGNOSIS — H109 Unspecified conjunctivitis: Secondary | ICD-10-CM | POA: Insufficient documentation

## 2022-04-30 DIAGNOSIS — E7800 Pure hypercholesterolemia, unspecified: Secondary | ICD-10-CM

## 2022-04-30 DIAGNOSIS — L299 Pruritus, unspecified: Secondary | ICD-10-CM | POA: Diagnosis not present

## 2022-04-30 DIAGNOSIS — I10 Essential (primary) hypertension: Secondary | ICD-10-CM

## 2022-04-30 DIAGNOSIS — L259 Unspecified contact dermatitis, unspecified cause: Secondary | ICD-10-CM | POA: Diagnosis not present

## 2022-04-30 MED ORDER — HYDROCORTISONE 1 % EX CREA: 1.0000 | TOPICAL_CREAM | Freq: Two times a day (BID) | CUTANEOUS | 0 refills | Status: DC

## 2022-04-30 MED ORDER — CIPROFLOXACIN HCL 0.3 % OP SOLN: 2.0000 [drp] | OPHTHALMIC | 0 refills | Status: DC

## 2022-04-30 NOTE — Assessment & Plan Note (Signed)
Patient has pruritus rash across her lower back and right abdomen. Will order Hydrocortisone cream. Instructed to return to office if rash become blistery, red, or painful. Allow 1-2 weeks for relief.

## 2022-04-30 NOTE — Assessment & Plan Note (Signed)
Given symptoms of purulent discharge and conjunctival erythema on exam will treat for bacterial conjunctivitis. Start cipro ophthalmic solution every 2h while awake for 2d then every 4h while awake. Maybe use warm compresses to eyes. Wash hands thoroughly and avoid rubbing eyes.

## 2022-04-30 NOTE — Progress Notes (Signed)
Acute Office Visit  Subjective:     Patient ID: Janet Peterson, female    DOB: January 03, 1951, 71 y.o.   MRN: [id number]  Chief Complaint  Patient presents with   Follow-up    right eye red and swollen/pos med reaction?? Ie itching    HPI Patient is in today for watering and crusting of right eye with blurred vision, drainage is watery, gritty sensation to eye, and redness for 7 days.  Has tried Benadryl without improvement. She also reports right flank and abdominal itching with rash for 2 weeks. Denies pain, blistering, erythema, fever, signs of viral illness. Has calamine lotion and benadryl without relief.  Review of Systems  All other systems reviewed and are negative.       Objective:    BP 116/76   Pulse 72   Temp (!) 97.4 F (36.3 C) (Oral)   Ht '5\' 3"'$  (1.6 m)   Wt 201 lb (91.2 kg)   SpO2 97%   BMI 35.61 kg/m    Physical Exam Vitals and nursing note reviewed.  Constitutional:      Appearance: Normal appearance. She is normal weight.  HENT:     Head: Normocephalic and atraumatic.  Eyes:     General:        Right eye: Discharge present.     Conjunctiva/sclera:     Right eye: Right conjunctiva is injected. Exudate (purulent) present.  Skin:    General: Skin is warm and dry.     Findings: Rash present. Rash is macular and papular.  Neurological:     General: No focal deficit present.     Mental Status: She is alert and oriented to person, place, and time. Mental status is at baseline.  Psychiatric:        Mood and Affect: Mood normal.        Behavior: Behavior normal.        Thought Content: Thought content normal.        Judgment: Judgment normal.     No results found for any visits on 04/30/22.      Assessment & Plan:   Problem List Items Addressed This Visit       Musculoskeletal and Integument   Contact dermatitis - Primary    Patient has pruritus rash across her lower back and right abdomen. Will order Hydrocortisone cream. Instructed to  return to office if rash become blistery, red, or painful. Allow 1-2 weeks for relief.        Other   Bacterial conjunctivitis of right eye    Given symptoms of purulent discharge and conjunctival erythema on exam will treat for bacterial conjunctivitis. Start cipro ophthalmic solution every 2h while awake for 2d then every 4h while awake. Maybe use warm compresses to eyes. Wash hands thoroughly and avoid rubbing eyes.       Other Visit Diagnoses     Itching           Meds ordered this encounter  Medications   hydrocortisone cream 1 %    Sig: Apply 1 Application topically 2 (two) times daily.    Dispense:  30 g    Refill:  0    Order Specific Question:   Supervising Provider    Answer:   [name] T [[id number]]   ciprofloxacin (CILOXAN) 0.3 % ophthalmic solution    Sig: Place 2 drops into the right eye every 2 (two) hours. Administer 1 drop, every 2 hours, while awake, for 2 days.  Then 1 drop, every 4 hours, while awake, for the next 5 days.    Dispense:  5 mL    Refill:  0    Order Specific Question:   Supervising Provider    Answer:   [name] T [[id number]]    Return if symptoms worsen or fail to improve.  Rubie Maid, FNP

## 2022-05-01 NOTE — Patient Instructions (Addendum)
Visit Information   Goals Addressed   None    Patient Care Plan: General Pharmacy (Adult)     Problem Identified: HTN, PVC, GERD, Osteopenia, Fibromyalgia, HLD, RLS, Depression/Anxiety   Priority: High  Onset Date: 04/10/2021     Long-Range Goal: Patient-Specific Goal   Start Date: 04/10/2021  Expected End Date: 10/08/2021  Recent Progress: On track  Priority: High  Note:   Current Barriers:  Elevated LDL - no recent labs Fibromyalgia pain  Pharmacist Clinical Goal(s):  Patient will achieve improvement in LDL/pain as evidenced by labs and symotoms through collaboration with PharmD and provider.   Interventions: 1:1 collaboration with Susy Frizzle, MD regarding development and update of comprehensive plan of care as evidenced by provider attestation and co-signature Inter-disciplinary care team collaboration (see longitudinal plan of care) Comprehensive medication review performed; medication list updated in electronic medical record  Hypertension/PVC (BP goal <140/90) 05/01/22 -Controlled -Current treatment: HCTZ '25mg'$  daily Appropriate, Effective, Query Safe,  Toprol XL '50mg'$  daily Appropriate, Effective, Safe, Accessible Valsartan '320mg'$  daily Appropriate, Effective, Safe, Accessible Doxazosin '2mg'$  Appropriate, Query effective, ,  -Medications previously tried: none noted  -Current home readings: not checking at home -Current dietary habits: eats two meals per day usually -Current exercise habits: minimal "walk from car to restaurant" -Denies hypotensive/hypertensive symptoms -Educated on BP goals and benefits of medications for prevention of heart attack, stroke and kidney damage; -BP has been controlled per her report.  Denies any dizziness at home - no recent falls. -Continue to monitor for dizziness with doxazosin.  Update 10/23/21 BP had been elevated a little recently so she was started on doxazosin.  She is not monitoring BP at home currently but does have a  monitor.  Denies headache or dizziness. Exercise is unchanged.  Currently monitoring her sodium and has stopped HCTZ during the process. Will FU on BMP tomorrow. Would prefer her to be able to start back on HCTZ due to elevated BP. No changes at this time, adjust based off BMP.   Hyperlipidemia: (LDL goal < 100) 05/01/22 -Not ideally controlled, LDL 108 -Current treatment: Zetia '10mg'$  daily Appropriate, Query effective, ,  -Medications previously tried: atorvastatin (myalgias)  -Educated on Cholesterol goals;  Benefits of statin for ASCVD risk reduction; Most recent lipids from 2018.  Patient has intolerance to statins. -Lipids are up to date, borderline to goal. Past intolerance to statin medications. No changes at this time - continue Zetia and routine lipid screenings.  Update 10/23/21 LDL was slightly elevated at last lipid panel, however, she was not fasting per her reports. Inaccurate reading, would recommend recheck at next OV for physical. Has previous insensitivity to statins due to myalgias. Would think that LDL would be < 100 if she fasted. No changes at this time - continue to monitor.  Osteopenia (Goal Maintain bone density/prevent fractures) -Controlled, not assessed -Last DEXA Scan: 04/02/21   T-Score femoral neck: -1.3 -Patient is not a candidate for pharmacologic treatment -Current treatment  None at this time -Medications previously tried: none noted  -Recommend weight-bearing and muscle strengthening exercises for building and maintaining bone density. -Recommended continue current management, repeat DEXA in two years.  Fibromyalgia (Goal: Manage pain) -Not ideally controlled -Current treatment  Oxycodone/APAP 5-'325mg'$  hs prn -Medications previously tried: gabapentin per patient - no effect -Pain on daily basis, she tries to take med once daily, however she does occasionally have to take one during the day.  Counseled on managing pain -Recommended to  continue current medication  Patient Goals/Self-Care  Activities Patient will:  - take medications as prescribed as evidenced by patient report and record review focus on medication adherence by pill box check blood pressure periodically, document, and provide at future appointments  Follow Up Plan: The care management team will reach out to the patient again over the next 180 days.            The patient verbalized understanding of instructions, educational materials, and care plan provided today and DECLINED offer to receive copy of patient instructions, educational materials, and care plan.  Telephone follow up appointment with pharmacy team member scheduled for: 6 months  Edythe Clarity, [place], PharmD, [place] Clinical Pharmacist Practitioner [place] [phone]

## 2022-05-07 ENCOUNTER — Telehealth: Payer: Self-pay

## 2022-05-07 ENCOUNTER — Other Ambulatory Visit: Payer: Self-pay | Admitting: Family Medicine

## 2022-05-07 MED ORDER — OXYCODONE-ACETAMINOPHEN 5-325 MG PO TABS: 1.0000 | ORAL_TABLET | Freq: Three times a day (TID) | ORAL | 0 refills | Status: DC | PRN

## 2022-05-07 MED ORDER — DOXAZOSIN MESYLATE 2 MG PO TABS: 2.0000 mg | ORAL_TABLET | Freq: Every day | ORAL | 0 refills | Status: DC

## 2022-05-07 NOTE — Telephone Encounter (Signed)
Called patient to confirm requested pharmacy due to 2 different pharmacies listed for medication refills. No answer, LVMTCB [phone].

## 2022-05-07 NOTE — Telephone Encounter (Signed)
Pt called requesting a refill on her Oxycodone. Last RF was 04/02/2022. Pt uses [facility]. Thank you.

## 2022-05-07 NOTE — Telephone Encounter (Signed)
[facility]. Prescription Request  05/07/2022  Is this a "Controlled Substance" medicine? No  LOV: 04/07/2022  What is the name of the medication or equipment? Doxazosin mesylate 2 mg  Have you contacted your pharmacy to request a refill? Yes   Which pharmacy would you like this sent to?  [facility], [place], [address], [address] [place] [address] Phone: [phone] Fax: [phone]  [id number] [place], [id number]: [phone]: [phone]   Patient notified that their request is being sent to the clinical staff for review and that they should receive a response within 2 business days.   Please [facility] at [phone]

## 2022-05-07 NOTE — Telephone Encounter (Signed)
Last OV 04/07/22 Requested Prescriptions  Pending Prescriptions Disp Refills   doxazosin (CARDURA) 2 MG tablet 90 tablet 0    Sig: Take 1 tablet (2 mg total) by mouth daily.     Cardiovascular:  Alpha Blockers Failed - 05/07/2022  9:32 AM      Failed - Valid encounter within last 6 months    Recent Outpatient Visits           6 months ago Hyponatremia   [place] Dennard Schaumann, Cammie Mcgee, MD   9 months ago [place] Dennard Schaumann, Cammie Mcgee, MD   9 months ago [place] Dennard Schaumann, Cammie Mcgee, MD   10 months ago Anemia, unspecified type   [place] Susy Frizzle, MD   11 months ago Acute non-recurrent pansinusitis   [place] Eulogio Bear, NP       Future Appointments             In 54 month Bo Merino, MD [facility] [place]            Passed - Last BP in normal range    BP Readings from Last 1 Encounters:  04/30/22 116/76

## 2022-05-09 ENCOUNTER — Other Ambulatory Visit: Payer: Self-pay | Admitting: Family Medicine

## 2022-05-15 ENCOUNTER — Telehealth: Payer: Self-pay

## 2022-05-15 NOTE — Telephone Encounter (Signed)
Rec' msg f/u on a PA fax from [facility] for pt for '80mg'$  Ingrezza?

## 2022-05-25 HISTORY — PX: SKIN BIOPSY: SHX1

## 2022-05-26 ENCOUNTER — Telehealth: Payer: Self-pay

## 2022-05-26 ENCOUNTER — Telehealth: Payer: Self-pay | Admitting: Neurology

## 2022-05-26 ENCOUNTER — Other Ambulatory Visit: Payer: Self-pay | Admitting: Family Medicine

## 2022-05-26 NOTE — Telephone Encounter (Signed)
Received a prior auth request for the patient. Last ov was 09/03/20. Pt doesn't have an apt on the books. In order to send refill or complete prior auth's for the patient we have to have a updated follow up visit. This can be with NP.

## 2022-05-26 NOTE — Telephone Encounter (Signed)
Pt was called and stated that her family doctor was supposed to fill it for her. She states she is going to call them to verify and if not she will call back to schedule an appt here.

## 2022-05-26 NOTE — Telephone Encounter (Signed)
[facility]  Information regarding your request This medication or product was previously approved on [id number] from 2022-05-25 to 2023-05-25. **Please note: This request was submitted electronically. Formulary lowering, tiering exception, cost reduction and/or pre-benefit determination review (including prospective Medicare hospice reviews) requests cannot be requested using this method of submission. Providers contact us at [phone] for further assistance.

## 2022-05-27 ENCOUNTER — Telehealth: Payer: Self-pay

## 2022-05-27 NOTE — Telephone Encounter (Signed)
Fax received that Ingrezza Cap 80 mg has been approved from 05/25/2022-05/25/2023. Mjp,lpn

## 2022-05-29 NOTE — Progress Notes (Signed)
Office Visit Note  Patient: Janet Peterson             Date of Birth: 02-21-1951           MRN: [id number]             PCP: Susy Frizzle, MD Referring: Susy Frizzle, MD Visit Date: 06/10/2022 Occupation: '@GUAROCC'$ @  Subjective:  Medication management  History of Present Illness: Janet Peterson is a 72 y.o. female history of psoriatic arthritis, osteoarthritis, degenerative disc disease and fibromyalgia syndrome.  She states she has been taking leflunomide 20 mg daily.  She has not noticed any increased joint swelling.  She continues to have pain and discomfort in her bilateral hands and her knee joints.  She had good response to viscosupplement injections given in July 2023.  She continues to have discomfort in her neck upper back and lower back.  She had radiofrequency ablation on her cervical spine yesterday.  She is planning to have some procedures on her lower back in the future.  Continues to have some generalized pain and discomfort from fibromyalgia.    Activities of Daily Living:  Patient reports morning stiffness for 1 hour.   Patient Reports nocturnal pain.  Difficulty dressing/grooming: Denies Difficulty climbing stairs: Reports Difficulty getting out of chair: Denies Difficulty using hands for taps, buttons, cutlery, and/or writing: Reports  Review of Systems  Constitutional:  Positive for fatigue.  HENT:  Positive for mouth dryness. Negative for mouth sores.   Eyes:  Positive for dryness.  Respiratory:  Negative for difficulty breathing.   Cardiovascular:  Negative for chest pain and palpitations.  Gastrointestinal:  Negative for blood in stool, constipation and diarrhea.  Endocrine: Negative for increased urination.  Genitourinary:  Negative for involuntary urination.  Musculoskeletal:  Positive for joint pain, joint pain, myalgias, muscle weakness, morning stiffness, muscle tenderness and myalgias. Negative for gait problem and joint swelling.  Skin:  Negative  for color change, rash, hair loss and sensitivity to sunlight.  Allergic/Immunologic: Positive for susceptible to infections.  Neurological:  Negative for dizziness and headaches.  Hematological:  Negative for swollen glands.  Psychiatric/Behavioral:  Positive for sleep disturbance. Negative for depressed mood. The patient is not nervous/anxious.     PMFS History:  Patient Active Problem List   Diagnosis Date Noted   Bacterial conjunctivitis of right eye 04/30/2022   Contact dermatitis 04/30/2022   Lesion of liver 06/20/2021   C. difficile colitis 09/29/2019   Colitis 09/28/2019   Anxiety and depression 09/28/2019   DDD (degenerative disc disease), cervical s/p fusion 11/12/2016   H/O total knee replacement, left 05/06/2016   DDD lumbar spine status post fusion 05/06/2016   Psoriasis 05/05/2016   High risk medication use 05/05/2016   Cervical post-laminectomy syndrome 12/09/2015   Thoracic postlaminectomy syndrome 12/09/2015   Psoriatic arthritis ([place]) 08/23/2012   Osteopenia 08/23/2012   HLD (hyperlipidemia) 08/23/2012   RLS (restless legs syndrome) 08/23/2012   Insomnia 08/23/2012   Fibromyalgia syndrome 08/12/2012   Low back pain 08/12/2012   Syncope    PVC (premature ventricular contraction)    OCD (obsessive compulsive disorder)    GERD (gastroesophageal reflux disease)    Hypertension     Past Medical History:  Diagnosis Date   Ankylosing spondylitis ([place])    Anxiety and depression    Depression    Fibromyalgia    GERD (gastroesophageal reflux disease)    Hiatal hernia    per patient, dx by GI  History of basal cell carcinoma excision    FACE, 1992  &  1996   History of hiatal hernia    History of thrush    Hyperlipidemia    Hypertension    Insomnia    Left breast mass    OCD (obsessive compulsive disorder)    Osteopenia    PONV (postoperative nausea and vomiting)    Psoriatic arthritis (HCC)    PVC (premature ventricular contraction)    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[place])     Family History  Problem Relation Age of Onset   Diabetes Mother    Heart disease Mother    Diabetes Father    Anuerysm Father    Diabetes Brother    Heart disease Brother    Diabetes Brother    Heart disease Brother    Breast cancer Neg Hx    Past Surgical History:  Procedure Laterality Date   BIOPSY  09/29/2019   Procedure: BIOPSY;  Surgeon: Ronnette Juniper, MD;  Location: [facility];  Service: Gastroenterology;;   BREAST EXCISIONAL BIOPSY Right    BREAST EXCISIONAL BIOPSY Left 05/04/2018   BREAST LUMPECTOMY WITH RADIOACTIVE SEED LOCALIZATION Left 05/04/2018   Procedure: LEFT BREAST LUMPECTOMY WITH RADIOACTIVE SEED LOCALIZATION AND LEFT BREAST NIPPLE BIOPSY;  Surgeon: Jovita Kussmaul, MD;  Location: [facility];  Service: General;  Laterality: Left;   BREAST SURGERY  05/25/1973   lumpectomy-- benign   BUNIONECTOMY  05/25/1989   CATARACT EXTRACTION W/ INTRAOCULAR LENS  IMPLANT, BILATERAL  05/25/1998   CERVICAL FUSION  07/24/2011   C5 -- C7   CESAREAN SECTION  05/26/1983   COLONOSCOPY WITH PROPOFOL N/A 09/29/2019   Procedure: COLONOSCOPY WITH PROPOFOL;  Surgeon: Ronnette Juniper, MD;  Location: [facility];  Service: Gastroenterology;  Laterality: N/A;   DISTAL INTERPHALANGEAL JOINT FUSION Right 03/14/2015   Procedure: RIGHT LONG FINGER DISTAL INTERPHALANGEAL JOINT ARTHRODESIS;  Surgeon: Iran Planas, MD;  Location: [facility];  Service: Orthopedics;  Laterality: Right;   ESOPHAGOGASTRODUODENOSCOPY (EGD) WITH PROPOFOL N/A 09/29/2019   Procedure: ESOPHAGOGASTRODUODENOSCOPY (EGD) WITH PROPOFOL;  Surgeon: Ronnette Juniper, MD;  Location: [facility];  Service: Gastroenterology;  Laterality: N/A;   EYE SURGERY  05/25/1993   rk (laser surgery), semi cornea transplant, detacted retina,  fluid removal   HYSTEROSCOPY WITH D & C N/A 12/09/2020   Procedure: DILATATION AND CURETTAGE /HYSTEROSCOPY;  Surgeon: Drema Dallas, DO;  Location: [facility];  Service: Gynecology;  Laterality: N/A;   KNEE ARTHROSCOPY Left 03/03/2004   POLYPECTOMY  09/29/2019   Procedure: POLYPECTOMY;  Surgeon: Ronnette Juniper, MD;  Location: [facility];  Service: Gastroenterology;;   POSTERIOR VITRECTOMY RIGHT EYE AND LASER   10/27/1999   SHOULDER SURGERY Right 05/26/1995   SKIN BIOPSY  05/2022   lower stomach   SPINAL FIXATION SURGERY W/ IMPLANT  2013 rod #1//  2014  rod 2   S1 -- T10  (rod #1)//   S1 -- T4 (rod #2)   THORACIC FUSION  03/13/2013   REMOVAL HARDWARE/  BONE GRAFT FUSION T10//  REVISION OF RODS   TOTAL KNEE ARTHROPLASTY Left 12/14/2005   UVULOPALATOPHARYNGOPLASTY  04/26/2006   w/  TONSILLECTOMY/  TURBINATE REDUCTIONS/  BILATERAL ANTERIOR ETHMOIDECTOMY   Social History   Social History Narrative   Lives with husband   Immunization History  Administered Date(s) Administered   Fluad Quad(high Dose 65+) 01/26/2019, 03/05/2020, 03/18/2022   Influenza Whole 03/04/2012   Influenza, High Dose Seasonal PF 03/25/2018   Influenza,inj,Quad PF,6+ Mos  02/28/2013, 03/07/2014, 02/28/2015, 03/27/2016, 03/09/2017   Influenza-Unspecified 04/08/2021   Pneumococcal Conjugate-13 09/10/2016   Pneumococcal Polysaccharide-23 03/12/2006, 11/09/2011   Pneumococcal-Unspecified 08/31/2016   Unspecified SARS-COV-2 Vaccination 08/07/2019, 08/28/2019, 03/08/2020   Zoster Recombinat (Shingrix) 08/12/2021, 11/21/2021     Objective: Vital Signs: BP (!) 147/73 (BP Location: Left Arm, Patient Position: Sitting, Cuff Size: Normal)   Pulse 76   Resp 16   Ht '5\' 3"'$  (1.6 m)   Wt 199 lb 3.2 oz (90.4 kg)   BMI 35.29 kg/m    Physical Exam Vitals and nursing note reviewed.  Constitutional:      Appearance: She is well-developed.  HENT:     Head: Normocephalic and atraumatic.  Eyes:     Conjunctiva/sclera: Conjunctivae normal.  Cardiovascular:     Rate and Rhythm: Normal rate and regular rhythm.     Heart sounds: Normal heart sounds.  Pulmonary:      Effort: Pulmonary effort is normal.     Breath sounds: Normal breath sounds.  Abdominal:     General: Bowel sounds are normal.     Palpations: Abdomen is soft.  Musculoskeletal:     Cervical back: Normal range of motion.  Lymphadenopathy:     Cervical: No cervical adenopathy.  Skin:    General: Skin is warm and dry.     Capillary Refill: Capillary refill takes less than 2 seconds.  Neurological:     Mental Status: She is alert and oriented to person, place, and time.  Psychiatric:        Behavior: Behavior normal.      Musculoskeletal Exam: She had limited lateral rotation of the cervical spine.  She had bilateral trapezius spasm.  She had thoracic kyphosis.  She had limited range of motion of her lumbar spine without any tenderness.  There was no SI joint tenderness.  Shoulders, elbows, wrist, MCPs were in good range of motion.  She had bilateral PIP and DIP thickening with no synovitis.  She had limited range of motion of bilateral hip joints.  She had good range of motion of bilateral knee joints.  Left knee joint was replaced.  There was no tenderness over ankles or MTPs.  CDAI Exam: CDAI Score: -- Patient Global: --; Provider Global: -- Swollen: --; Tender: -- Joint Exam 06/10/2022   No joint exam has been documented for this visit   There is currently no information documented on the homunculus. Go to the Rheumatology activity and complete the homunculus joint exam.  Investigation: No additional findings.  Imaging: No results found.  Recent Labs: Lab Results  Component Value Date   WBC 4.6 06/01/2022   HGB 11.0 (L) 06/01/2022   PLT 221 06/01/2022   NA 135 06/05/2022   K 3.6 06/05/2022   CL 99 06/05/2022   CO2 24 06/05/2022   GLUCOSE 154 (H) 06/05/2022   BUN 9 06/05/2022   CREATININE 0.58 (L) 06/05/2022   BILITOT 0.3 06/01/2022   ALKPHOS 118 04/25/2020   AST 14 06/01/2022   ALT 9 06/01/2022   PROT 6.5 06/01/2022   ALBUMIN 3.5 04/25/2020   CALCIUM 9.6  06/05/2022   GFRAA 82 09/26/2020   QFTBGOLD Indeterminate 08/14/2016   QFTBGOLDPLUS Negative 12/22/2018    Speciality Comments: Simponi Aria every 8 weeks started Jan 2018  TB gold negative 08/19/16  Procedures:  No procedures performed Allergies: Latex, Amlodipine besy-benazepril hcl, Amoxicillin-pot clavulanate, Bextra [valdecoxib], Chlorhexidine, Lipitor [atorvastatin calcium], Sulfa antibiotics, and Zocor [simvastatin]   Assessment / Plan:  Visit Diagnoses: Psoriatic arthritis (HCC)-she denies any history of joint swelling.  She had no synovitis or dactylitis on the examination today.  She has been taking leflunomide 20 mg daily without any interruption.  She denies any recent episodes of Achilles tendinitis, Planter fasciitis or uveitis.  Psoriasis-she had no active psoriasis lesions.  High risk medication use - Arava 20 mg 1 tablet by mouth daily.  Previously was on Simponi Aria IV infusion 2 mg/kg every 8 weeks-patient discontinued in March 2021 due to C. difficile.  Labs obtained on June 01, 2022 showed mild anemia which has been stable.  CMP from June 01, 2022 showed low sodium and potassium.  Patient states she was taken off HCTZ by her PCP.  Information on her immunization was placed in the AVS.  She will be returning in April for repeat labs.  She was also advised to hold leflunomide if she develops an infection resume after the infection resolves.  Primary osteoarthritis of both hands-she had bilateral PIP and DIP thickening.  No synovitis or dactylitis was noted.  Primary osteoarthritis of right knee - [place] right knee  June/July 2023.  She had good response to viscosupplement injections.  She plans to reschedule at the follow-up visit.  H/O total knee replacement, left-she had good range of motion without discomfort.  DDD (degenerative disc disease), cervical s/p fusion-she has limited motion of the cervical spine.  Patient states she had radiofrequency ablation  yesterday which was helpful.  DDD (degenerative disc disease), thoracic-chronic pain.  DDD lumbar spine status post fusion-she has chronic pain discomfort in the lumbar spine.  She takes oxycodone for pain.  She also takes muscle relaxer and Lyrica which helped.  Fibromyalgia-she continues to have generalized pain and discomfort.  She had positive tender points.  Primary insomnia-good sleep hygiene was discussed.  Other fatigue-related to insomnia and fibromyalgia.  Osteopenia of multiple sites - DEXA scan from April 02 2021 DEXA showed T score -1.3.  BMD 0.857 in the left femoral neck.  Calcium rich diet and exercise was emphasized.  Patient is planning to join the gym.  Other medical problems are listed as follows:  History of hyperlipidemia  History of gastroesophageal reflux (GERD)  History of hypertension-blood pressure was elevated at 157/89.  Repeat blood pressure was 147/73.  Patient recently came off HCTZ.  Advised patient to monitor blood pressure closely and follow-up with her PCP.  History of OCD (obsessive compulsive disorder)  RLS (restless legs syndrome)  History of migraine  Orders: No orders of the defined types were placed in this encounter.  No orders of the defined types were placed in this encounter.    Follow-Up Instructions: Return in about 5 months (around 11/09/2022) for Psoriatic arthritis.   Bo Merino, MD  Note - This record has been created using Editor, commissioning.  Chart creation errors have been sought, but may not always  have been located. Such creation errors do not reflect on  the standard of medical care.

## 2022-06-01 ENCOUNTER — Other Ambulatory Visit: Payer: Self-pay | Admitting: *Deleted

## 2022-06-01 DIAGNOSIS — Z79899 Other long term (current) drug therapy: Secondary | ICD-10-CM

## 2022-06-02 ENCOUNTER — Telehealth: Payer: Self-pay

## 2022-06-02 LAB — COMPLETE METABOLIC PANEL WITH GFR
AG Ratio: 1.7 (calc) (ref 1.0–2.5)
ALT: 9 U/L (ref 6–29)
AST: 14 U/L (ref 10–35)
Albumin: 4.1 g/dL (ref 3.6–5.1)
Alkaline phosphatase (APISO): 96 U/L (ref 37–153)
BUN: 11 mg/dL (ref 7–25)
CO2: 26 mmol/L (ref 20–32)
Calcium: 9.6 mg/dL (ref 8.6–10.4)
Chloride: 90 mmol/L — ABNORMAL LOW (ref 98–110)
Creat: 0.73 mg/dL (ref 0.60–1.00)
Globulin: 2.4 g/dL (calc) (ref 1.9–3.7)
Glucose, Bld: 90 mg/dL (ref 65–99)
Potassium: 4.8 mmol/L (ref 3.5–5.3)
Sodium: 124 mmol/L — ABNORMAL LOW (ref 135–146)
Total Bilirubin: 0.3 mg/dL (ref 0.2–1.2)
Total Protein: 6.5 g/dL (ref 6.1–8.1)
eGFR: 87 mL/min/{1.73_m2} (ref >=60)

## 2022-06-02 LAB — CBC WITH DIFFERENTIAL/PLATELET
Absolute Monocytes: 515 cells/uL (ref 200–950)
Basophils Absolute: 69 cells/uL (ref 0–200)
Basophils Relative: 1.5 %
Eosinophils Absolute: 92 cells/uL (ref 15–500)
Eosinophils Relative: 2 %
HCT: 32.9 % — ABNORMAL LOW (ref 35.0–45.0)
Hemoglobin: 11 g/dL — ABNORMAL LOW (ref 11.7–15.5)
Lymphs Abs: 1467 cells/uL (ref 850–3900)
MCH: 31.5 pg (ref 27.0–33.0)
MCHC: 33.4 g/dL (ref 32.0–36.0)
MCV: 94.3 fL (ref 80.0–100.0)
MPV: 11 fL (ref 7.5–12.5)
Monocytes Relative: 11.2 %
Neutro Abs: 2456 cells/uL (ref 1500–7800)
Neutrophils Relative %: 53.4 %
Platelets: 221 10*3/uL (ref 140–400)
RBC: 3.49 10*6/uL — ABNORMAL LOW (ref 3.80–5.10)
RDW: 12.8 % (ref 11.0–15.0)
Total Lymphocyte: 31.9 %
WBC: 4.6 10*3/uL (ref 3.8–10.8)

## 2022-06-02 NOTE — Progress Notes (Signed)
Hemoglobin is low and stable.  Sodium and chloride are low.  Please forward results to her PCP.

## 2022-06-02 NOTE — Telephone Encounter (Signed)
Please tell patient her sodium is extremely low.  Please hold HCTZ and recheck BMP on Friday.  Also have her limit her fluid intake to less than 1200 mL per day.   Pt advised and verbalized understanding of all. Lab appointment scheduled for 06/05/22 @ 2 pm and pt is aware. Orders entered. Mjp,lpn

## 2022-06-02 NOTE — Telephone Encounter (Signed)
Please tell patient her sodium is extremely low.  Please hold HCTZ and recheck BMP on Friday.  Also have her limit her fluid intake to less than 1200 mL per day.

## 2022-06-05 ENCOUNTER — Other Ambulatory Visit: Payer: Medicare Other

## 2022-06-05 DIAGNOSIS — E871 Hypo-osmolality and hyponatremia: Secondary | ICD-10-CM

## 2022-06-06 LAB — BASIC METABOLIC PANEL
BUN/Creatinine Ratio: 16 (calc) (ref 6–22)
BUN: 9 mg/dL (ref 7–25)
CO2: 24 mmol/L (ref 20–32)
Calcium: 9.6 mg/dL (ref 8.6–10.4)
Chloride: 99 mmol/L (ref 98–110)
Creat: 0.58 mg/dL — ABNORMAL LOW (ref 0.60–1.00)
Glucose, Bld: 154 mg/dL — ABNORMAL HIGH (ref 65–99)
Potassium: 3.6 mmol/L (ref 3.5–5.3)
Sodium: 135 mmol/L (ref 135–146)

## 2022-06-09 ENCOUNTER — Telehealth: Payer: Self-pay

## 2022-06-09 ENCOUNTER — Other Ambulatory Visit: Payer: Self-pay | Admitting: Family Medicine

## 2022-06-09 MED ORDER — OXYCODONE-ACETAMINOPHEN 5-325 MG PO TABS: 1.0000 | ORAL_TABLET | Freq: Three times a day (TID) | ORAL | 0 refills | Status: DC | PRN

## 2022-06-09 NOTE — Telephone Encounter (Signed)
Pt called requesting a refill on her Oxycodone. Last RF 05/07/2022. Last OV 04/07/2022.  Pt would like RX sent to [facility]. Thank you.

## 2022-06-10 ENCOUNTER — Ambulatory Visit: Payer: Medicare Other | Attending: Rheumatology | Admitting: Rheumatology

## 2022-06-10 ENCOUNTER — Encounter: Payer: Self-pay | Admitting: Rheumatology

## 2022-06-10 VITALS — BP 147/73 | HR 76 | Resp 16 | Ht 63.0 in | Wt 199.2 lb

## 2022-06-10 DIAGNOSIS — Z8669 Personal history of other diseases of the nervous system and sense organs: Secondary | ICD-10-CM

## 2022-06-10 DIAGNOSIS — Z8679 Personal history of other diseases of the circulatory system: Secondary | ICD-10-CM

## 2022-06-10 DIAGNOSIS — M8589 Other specified disorders of bone density and structure, multiple sites: Secondary | ICD-10-CM

## 2022-06-10 DIAGNOSIS — L409 Psoriasis, unspecified: Secondary | ICD-10-CM | POA: Diagnosis not present

## 2022-06-10 DIAGNOSIS — M5030 Other cervical disc degeneration, unspecified cervical region: Secondary | ICD-10-CM

## 2022-06-10 DIAGNOSIS — Z79899 Other long term (current) drug therapy: Secondary | ICD-10-CM

## 2022-06-10 DIAGNOSIS — M19042 Primary osteoarthritis, left hand: Secondary | ICD-10-CM

## 2022-06-10 DIAGNOSIS — F5101 Primary insomnia: Secondary | ICD-10-CM

## 2022-06-10 DIAGNOSIS — M19041 Primary osteoarthritis, right hand: Secondary | ICD-10-CM | POA: Diagnosis not present

## 2022-06-10 DIAGNOSIS — M5134 Other intervertebral disc degeneration, thoracic region: Secondary | ICD-10-CM

## 2022-06-10 DIAGNOSIS — M1711 Unilateral primary osteoarthritis, right knee: Secondary | ICD-10-CM

## 2022-06-10 DIAGNOSIS — Z8639 Personal history of other endocrine, nutritional and metabolic disease: Secondary | ICD-10-CM

## 2022-06-10 DIAGNOSIS — M47816 Spondylosis without myelopathy or radiculopathy, lumbar region: Secondary | ICD-10-CM

## 2022-06-10 DIAGNOSIS — G2581 Restless legs syndrome: Secondary | ICD-10-CM

## 2022-06-10 DIAGNOSIS — R5383 Other fatigue: Secondary | ICD-10-CM

## 2022-06-10 DIAGNOSIS — Z8719 Personal history of other diseases of the digestive system: Secondary | ICD-10-CM

## 2022-06-10 DIAGNOSIS — L4050 Arthropathic psoriasis, unspecified: Secondary | ICD-10-CM

## 2022-06-10 DIAGNOSIS — Z8659 Personal history of other mental and behavioral disorders: Secondary | ICD-10-CM

## 2022-06-10 DIAGNOSIS — Z96652 Presence of left artificial knee joint: Secondary | ICD-10-CM

## 2022-06-10 DIAGNOSIS — M797 Fibromyalgia: Secondary | ICD-10-CM

## 2022-06-10 NOTE — Patient Instructions (Signed)
Standing Labs We placed an order today for your standing lab work.   Please have your standing labs drawn in April and every 3 months  Please have your labs drawn 2 weeks prior to your appointment so that the provider can discuss your lab results at your appointment.  Please note that you may see your imaging and lab results in [place] before we have reviewed them. We will contact you once all results are reviewed. Please allow our office up to 72 hours to thoroughly review all of the results before contacting the office for clarification of your results.  Lab hours are:   Monday through Thursday from 8:00 am -12:30 pm and 1:00 pm-5:00 pm and Friday from 8:00 am-12:00 pm.  Please be advised, all patients with office appointments requiring lab work will take precedent over walk-in lab work.   Labs are drawn by [facility]. Please bring your co-pay at the time of your lab draw.  You may receive a bill from [facility] for your lab work.  Please note if you are on Hydroxychloroquine and and an order has been placed for a Hydroxychloroquine level, you will need to have it drawn 4 hours or more after your last dose.  If you wish to have your labs drawn at another location, please call the office 24 hours in advance so we can fax the orders.  The office is located at [address], [place], [place], [place] [address] No appointment is necessary.    If you have any questions regarding directions or hours of operation,  please call [phone].   As a reminder, please drink plenty of water prior to coming for your lab work. Thanks!   Vaccines You are taking a medication(s) that can suppress your immune system.  The following immunizations are recommended: Flu annually Covid-19  RSV Td/Tdap (tetanus, diphtheria, pertussis) every 10 years Pneumonia (Prevnar 15 then Pneumovax 23 at least 1 year apart.  Alternatively, can take Prevnar 20 without needing additional dose) Shingrix: 2 doses from 4  weeks to 6 months apart  Please check with your PCP to make sure you are up to date.   If you have signs or symptoms of an infection or start antibiotics: First, call your PCP for workup of your infection. Hold your medication through the infection, until you complete your antibiotics, and until symptoms resolve if you take the following: Injectable medication (Actemra, Benlysta, Cimzia, Cosentyx, Enbrel, Humira, Kevzara, Orencia, Remicade, Simponi, Stelara, Taltz, Tremfya) Methotrexate Leflunomide (Arava) Mycophenolate (Cellcept) Morrie Sheldon, Olumiant, or Rinvoq

## 2022-06-11 ENCOUNTER — Telehealth: Payer: Self-pay

## 2022-06-11 ENCOUNTER — Other Ambulatory Visit: Payer: Self-pay | Admitting: Family Medicine

## 2022-06-11 MED ORDER — OXYCODONE-ACETAMINOPHEN 5-325 MG PO TABS: 1.0000 | ORAL_TABLET | Freq: Three times a day (TID) | ORAL | 0 refills | Status: DC | PRN

## 2022-06-11 NOTE — Telephone Encounter (Signed)
[facility]. It looks like the Rx was confirmed by the pharmacy on 06/09/2022 @ 9:53 am. Pt asks if we could resend it to [facility]? Thank you.

## 2022-06-12 ENCOUNTER — Ambulatory Visit
Admission: RE | Admit: 2022-06-12 | Discharge: 2022-06-12 | Disposition: A | Discharge status: Home / Self Care | Payer: Medicare Other | Source: Ambulatory Visit | Attending: Family Medicine | Admitting: Family Medicine

## 2022-06-12 DIAGNOSIS — Z1231 Encounter for screening mammogram for malignant neoplasm of breast: Secondary | ICD-10-CM

## 2022-06-29 ENCOUNTER — Other Ambulatory Visit: Payer: Self-pay | Admitting: Physician Assistant

## 2022-06-29 NOTE — Telephone Encounter (Signed)
Next Visit: 11/10/2022  Last Visit: 06/10/2022  Last Fill: 03/30/2022  DX: Psoriatic arthritis   Current Dose per office note on 06/10/2022: Arava 20 mg 1 tablet by mouth daily.   Labs: 06/01/2022 Hemoglobin is low and stable.  Sodium and chloride are low.  06/05/2022 BMP: glucose 154, creat 0.58  Okay to refill arava?

## 2022-07-13 ENCOUNTER — Telehealth: Payer: Self-pay

## 2022-07-13 NOTE — Telephone Encounter (Signed)
Pt called requesting a refill on her Oxycodone. Last RF was 06/11/2022. Pt uses [facility].

## 2022-07-14 ENCOUNTER — Other Ambulatory Visit: Payer: Self-pay | Admitting: Family Medicine

## 2022-07-14 MED ORDER — OXYCODONE-ACETAMINOPHEN 5-325 MG PO TABS: 1.0000 | ORAL_TABLET | Freq: Three times a day (TID) | ORAL | 0 refills | Status: DC | PRN

## 2022-07-19 ENCOUNTER — Other Ambulatory Visit: Payer: Self-pay | Admitting: Family Medicine

## 2022-07-20 NOTE — Therapy (Signed)
OUTPATIENT PHYSICAL THERAPY CERVICAL EVALUATION   Patient Name: Janet Peterson MRN: [id number] DOB:08-05-1950, 72 y.o., female Today's Date: 07/21/2022  END OF SESSION:  PT End of Session - 07/21/22 1539     Visit Number 1    Number of Visits 17    Date for PT Re-Evaluation 09/15/22    Authorization Type [employer] MDC    Progress Note Due on Visit 10    PT Start Time [id number]    PT Stop Time 1631    PT Time Calculation (min) 49 min    Activity Tolerance Patient tolerated treatment well;No increased pain    Behavior During Therapy WFL for tasks assessed/performed             Past Medical History:  Diagnosis Date   Ankylosing spondylitis ([place])    Anxiety and depression    Depression    Fibromyalgia    GERD (gastroesophageal reflux disease)    Hiatal hernia    per patient, dx by GI    History of basal cell carcinoma excision    FACE, 1992  &  1996   History of hiatal hernia    History of thrush    Hyperlipidemia    Hypertension    Insomnia    Left breast mass    OCD (obsessive compulsive disorder)    Osteopenia    PONV (postoperative nausea and vomiting)    Psoriatic arthritis ([facility])    PVC (premature ventricular contraction)    Rheumatoid arthritis ([place])    Past Surgical History:  Procedure Laterality Date   BIOPSY  09/29/2019   Procedure: BIOPSY;  Surgeon: Ronnette Juniper, MD;  Location: [facility];  Service: Gastroenterology;;   BREAST EXCISIONAL BIOPSY Right    BREAST EXCISIONAL BIOPSY Left 05/04/2018   BREAST LUMPECTOMY WITH RADIOACTIVE SEED LOCALIZATION Left 05/04/2018   Procedure: LEFT BREAST LUMPECTOMY WITH RADIOACTIVE SEED LOCALIZATION AND LEFT BREAST NIPPLE BIOPSY;  Surgeon: Jovita Kussmaul, MD;  Location: [place];  Service: General;  Laterality: Left;   BREAST SURGERY  05/25/1973   lumpectomy-- benign   BUNIONECTOMY  05/25/1989   CATARACT EXTRACTION W/ INTRAOCULAR LENS  IMPLANT, BILATERAL  05/25/1998   CERVICAL FUSION  07/24/2011   C5 -- C7    CESAREAN SECTION  05/26/1983   COLONOSCOPY WITH PROPOFOL N/A 09/29/2019   Procedure: COLONOSCOPY WITH PROPOFOL;  Surgeon: Ronnette Juniper, MD;  Location: [facility];  Service: Gastroenterology;  Laterality: N/A;   DISTAL INTERPHALANGEAL JOINT FUSION Right 03/14/2015   Procedure: RIGHT LONG FINGER DISTAL INTERPHALANGEAL JOINT ARTHRODESIS;  Surgeon: Iran Planas, MD;  Location: [place];  Service: Orthopedics;  Laterality: Right;   ESOPHAGOGASTRODUODENOSCOPY (EGD) WITH PROPOFOL N/A 09/29/2019   Procedure: ESOPHAGOGASTRODUODENOSCOPY (EGD) WITH PROPOFOL;  Surgeon: Ronnette Juniper, MD;  Location: [facility];  Service: Gastroenterology;  Laterality: N/A;   EYE SURGERY  05/25/1993   rk (laser surgery), semi cornea transplant, detacted retina,  fluid removal   HYSTEROSCOPY WITH D & C N/A 12/09/2020   Procedure: DILATATION AND CURETTAGE /HYSTEROSCOPY;  Surgeon: Drema Dallas, DO;  Location: [place];  Service: Gynecology;  Laterality: N/A;   KNEE ARTHROSCOPY Left 03/03/2004   POLYPECTOMY  09/29/2019   Procedure: POLYPECTOMY;  Surgeon: Ronnette Juniper, MD;  Location: [facility];  Service: Gastroenterology;;   POSTERIOR VITRECTOMY RIGHT EYE AND LASER   10/27/1999   SHOULDER SURGERY Right 05/26/1995   SKIN BIOPSY  05/2022   lower stomach   SPINAL FIXATION SURGERY W/ IMPLANT  2013 rod #  1//  2014  rod 2   S1 -- T10  (rod #1)//   S1 -- T4 (rod #2)   THORACIC FUSION  03/13/2013   REMOVAL HARDWARE/  BONE GRAFT FUSION T10//  REVISION OF RODS   TOTAL KNEE ARTHROPLASTY Left 12/14/2005   UVULOPALATOPHARYNGOPLASTY  04/26/2006   w/  TONSILLECTOMY/  TURBINATE REDUCTIONS/  BILATERAL ANTERIOR ETHMOIDECTOMY   Patient Active Problem List   Diagnosis Date Noted   Bacterial conjunctivitis of right eye 04/30/2022   Contact dermatitis 04/30/2022   Lesion of liver 06/20/2021   C. difficile colitis 09/29/2019   Colitis 09/28/2019   Anxiety and depression 09/28/2019   DDD (degenerative  disc disease), cervical s/p fusion 11/12/2016   H/O total knee replacement, left 05/06/2016   DDD lumbar spine status post fusion 05/06/2016   Psoriasis 05/05/2016   High risk medication use 05/05/2016   Cervical post-laminectomy syndrome 12/09/2015   Thoracic postlaminectomy syndrome 12/09/2015   Psoriatic arthritis ([place]) 08/23/2012   Osteopenia 08/23/2012   HLD (hyperlipidemia) 08/23/2012   RLS (restless legs syndrome) 08/23/2012   Insomnia 08/23/2012   Fibromyalgia syndrome 08/12/2012   Low back pain 08/12/2012   Syncope    PVC (premature ventricular contraction)    OCD (obsessive compulsive disorder)    GERD (gastroesophageal reflux disease)    Hypertension     PCP: Susy Frizzle, MD  REFERRING PROVIDER: Loanne Drilling, PA-C  REFERRING DIAG: [phone] (ICD-10-CM) - Radiculopathy, cervical region  THERAPY DIAG:  Cervicalgia  Muscle weakness (generalized)  Abnormal posture  Rationale for Evaluation and Treatment: Rehabilitation  ONSET DATE: several years  SUBJECTIVE:                                                                                                                                                                                                         SUBJECTIVE STATEMENT: Pt reports several year history of cervical pain, received fusion about 10 years ago. Was doing better until about 5 years ago, states she was referred to pain management and received botox injections which she states made her weak and had a hard time holding her head up, although this improved with time. Pt states that she has also received steroid injections and nerve ablation with some relief. Pt states her primary concern is weakness - states she received more botox injections last summer and had the same type of weakness, seems to be worsening although pain remains about the same. Once her muscles fatigue she states she has to lie down the rest of the day or sit in her recliner.  Denies  any other red flag symptoms, states she wants to try PT to get stronger.    PERTINENT HISTORY:  HTN, PVCs, GERD, hx Cdiff, osteopenia, hx lumbar and cervical fusions (about 10 years ago), fibromyalgia, anxiety/depression, hx of skin cancer   PAIN:  Are you having pain: none Location/description: aching in neck Best-worst over past week: 0-9/10  Per eval -  - aggravating factors: walking, laundry, kitchen/dishes, upper body - Easing factors: resting     PRECAUTIONS: multiple fusions, remote thoracic compression fractures, osteopenia  WEIGHT BEARING RESTRICTIONS: No  FALLS:  Has patient fallen in last 6 months? No  LIVING ENVIRONMENT: W/ husband, 1 story + upstairs bonus, 3 STE + handrail  OCCUPATION: retired - Manufacturing systems engineer   PLOF: Independent  PATIENT GOALS: get stronger, less pain, be able to hold neck up   NEXT MD VISIT: TBD - as needed   OBJECTIVE:   DIAGNOSTIC FINDINGS:  Thoracic MRI Aug 2023 IMPRESSION: No acute abnormality or change compared to the prior exams.   Remote mild superior endplate compression fracture of T4 and severe compression fracture T10. The patient is status post decompression at T10 and thoracolumbar fusion without evidence of complication.  PATIENT SURVEYS:  FOTO 38 current, 49 predicted  COGNITION: Overall cognitive status: Within functional limits for tasks assessed  SENSATION: No reported neuro/sensory deficits, red flag questioning reassuring   POSTURE: forward head, kyphosis  PALPATION: Palpable tightness periscapular musculature BIL   CERVICAL ROM:   Active ROM A/PROM (deg) eval  Flexion 75% pull  Extension 50% R sided pain   Right lateral flexion   Left lateral flexion   Right rotation 35 deg  Left rotation 50 deg   (Blank rows = not tested) (Key: WFL = within functional limits not formally assessed, * = concordant pain, s = stiffness/stretching sensation, NT = not tested)   UPPER EXTREMITY  ROM:  A/PROM Right eval Left eval  Shoulder flexion 132 deg 129 deg  Shoulder abduction    Shoulder internal rotation    Shoulder external rotation    Elbow flexion    Elbow extension    Wrist flexion    Wrist extension     (Blank rows = not tested) (Key: WFL = within functional limits not formally assessed, * = concordant pain, s = stiffness/stretching sensation, NT = not tested)  Comments:    UPPER EXTREMITY MMT:  MMT Right eval Left eval  Shoulder flexion 4 4  Shoulder extension    Shoulder abduction 4 4  Shoulder extension    Shoulder internal rotation    Shoulder external rotation    Elbow flexion    Elbow extension    Grip strength    (Blank rows = not tested)  (Key: WFL = within functional limits not formally assessed, * = concordant pain, s = stiffness/stretching sensation, NT = not tested)  Comments:   CERVICAL SPECIAL TESTS:  Deferred given presence of hardware  FUNCTIONAL TESTS:  Overhead reach limited as above  TODAY'S TREATMENT:  [facility] Adult PT Treatment:                                                DATE: 07/21/22 Therapeutic Exercise: Scapular retraction x10 cues for HEP, monitoring symptoms LS stretch to R x30sec cues for HEP, monitoring symptoms HEP handout + education  PATIENT EDUCATION:  Education details: Pt education on PT impairments, prognosis, and POC. Informed consent. Rationale for interventions, safe/appropriate HEP performance Person educated: Patient Education method: Explanation, Demonstration, Tactile cues, Verbal cues, and Handouts Education comprehension: verbalized understanding, returned demonstration, verbal cues required, tactile cues required, and needs further education    HOME EXERCISE PROGRAM: Access Code: [email or web address] URL: [email or web address] Date: 07/21/2022 Prepared by: Enis Slipper  Exercises - Seated Scapular Retraction  - 1 x daily - 7 x weekly - 3 sets - 10 reps - Gentle Levator Scapulae Stretch  - 1 x daily - 7 x weekly - 2 sets - 3 reps - 30sec  hold  ASSESSMENT:  CLINICAL IMPRESSION: Patient is a pleasant 72 y.o. woman who was seen today for physical therapy evaluation and treatment for chronic neck pain. Pt states her primary issue is weakness she attributes to prior botox injections, states she is limited in daily activities and once her muscles fatigue she has to lie in bed or recliner the rest of the day. On exam pt does demo postural deficits, reduced cervical/GH mobility. Tolerates HEP well without issue, cues/education provided for appropriate performance. Did advise on avoiding overexertion with HEP and monitoring symptoms as PT goes on given pt report of symptom behavior, pt verbalizes agreement/understanding with plan as outlined below. No adverse events or increases in pain reported today. Recommend skilled PT to address aforementioned deficits and maximize functional tolerance. Pt departs today's session in no acute distress, all voiced questions/concerns addressed appropriately from PT perspective.    OBJECTIVE IMPAIRMENTS: decreased activity tolerance, decreased endurance, decreased mobility, decreased ROM, decreased strength, hypomobility, impaired perceived functional ability, impaired UE functional use, improper body mechanics, postural dysfunction, and pain.   ACTIVITY LIMITATIONS: carrying, lifting, bending, standing, dressing, reach over head, hygiene/grooming, and locomotion level  PARTICIPATION LIMITATIONS: meal prep, cleaning, laundry, and driving  PERSONAL FACTORS: Age, Time since onset of injury/illness/exacerbation, and 3+ comorbidities: HTN, osteopenia, hx lumbar and cervical fusions, fibromyalgia, anxiety/depression  are also affecting patient's functional outcome.   REHAB POTENTIAL: Fair given chronicity and  comorbidities  CLINICAL DECISION MAKING: Evolving/moderate complexity  EVALUATION COMPLEXITY: Moderate   GOALS: Goals reviewed with patient? No  SHORT TERM GOALS: Target date: 08/18/2022 Pt will demonstrate appropriate understanding and performance of initially prescribed HEP in order to facilitate improved independence with management of symptoms.  Baseline: HEP provided on eval Goal status: INITIAL   2. Pt will score greater than or equal to 44 on FOTO in order to demonstrate improved perception of function due to symptoms.  Baseline: 38  Goal status: INITIAL    LONG TERM GOALS: Target date: 09/15/2022  Pt will score 49 on FOTO in order to demonstrate improved perception of function due to symptoms. Baseline: 38 Goal status: INITIAL  2. Pt will demonstrate at least 45 degrees of active cervical rotation ROM BIL in order to demonstrate improved environmental awareness and safety with driving.  Baseline: see ROM chart above Goal status: INITIAL  3. Pt will demonstrate at least 4+/5 shoulder MMT for improved  symmetry of UE strength and improved tolerance to functional movements.  Baseline: see MMT chart above Goal status: INITIAL   4. Pt will report/demonstrate ability to stand/walk for up to 75mn with less than 2 point increase on NPS in order to demonstrate improved tolerance to household activities. Baseline: up to 9/10 pain with prolonged activities Goal status: INITIAL     5. Pt will report/demonstrate independence with final prescribed HEP in order to facilitate improved self efficacy with management of symptoms.   Baseline: TBD  Goal status: INITIAL      PLAN:  PT FREQUENCY: 2x/week  PT DURATION: 8 weeks  PLANNED INTERVENTIONS: Therapeutic exercises, Therapeutic activity, Neuromuscular re-education, Balance training, Gait training, Patient/Family education, Self Care, Vestibular training, Dry Needling, Cryotherapy, Moist heat, Taping, Manual therapy, and  Re-evaluation  PLAN FOR NEXT SESSION: gentle postural stability/strengthening, cervical mobility as appropriate. Consider STM to reduce tightness PRN.     DLeeroy ChaPT, DPT 07/21/2022 5:44 PM

## 2022-07-21 ENCOUNTER — Ambulatory Visit: Payer: Medicare Other | Attending: Physician Assistant | Admitting: Physical Therapy

## 2022-07-21 ENCOUNTER — Encounter: Payer: Self-pay | Admitting: Physical Therapy

## 2022-07-21 ENCOUNTER — Other Ambulatory Visit: Payer: Self-pay

## 2022-07-21 DIAGNOSIS — R293 Abnormal posture: Secondary | ICD-10-CM | POA: Insufficient documentation

## 2022-07-21 DIAGNOSIS — M6281 Muscle weakness (generalized): Secondary | ICD-10-CM | POA: Diagnosis present

## 2022-07-21 DIAGNOSIS — M542 Cervicalgia: Secondary | ICD-10-CM | POA: Insufficient documentation

## 2022-07-29 ENCOUNTER — Ambulatory Visit: Payer: Medicare Other | Attending: Physician Assistant | Admitting: Physical Therapy

## 2022-07-29 ENCOUNTER — Encounter: Payer: Self-pay | Admitting: Physical Therapy

## 2022-07-29 DIAGNOSIS — M546 Pain in thoracic spine: Secondary | ICD-10-CM | POA: Insufficient documentation

## 2022-07-29 DIAGNOSIS — M542 Cervicalgia: Secondary | ICD-10-CM | POA: Insufficient documentation

## 2022-07-29 DIAGNOSIS — M6281 Muscle weakness (generalized): Secondary | ICD-10-CM | POA: Insufficient documentation

## 2022-07-29 DIAGNOSIS — R293 Abnormal posture: Secondary | ICD-10-CM | POA: Diagnosis present

## 2022-07-29 NOTE — Therapy (Signed)
OUTPATIENT PHYSICAL THERAPY TREATMENT NOTE   Patient Name: Janet Peterson MRN: [id number] DOB:June 26, 1950, 72 y.o., female Today's Date: 07/29/2022  PCP: Susy Frizzle, MD   REFERRING PROVIDER: Loanne Drilling, PA-C    END OF SESSION:   PT End of Session - 07/29/22 1152     Visit Number 2    Number of Visits 17    Date for PT Re-Evaluation 09/15/22    Authorization Type [employer] [place]    Progress Note Due on Visit 10    PT Start Time 1148    PT Stop Time 1226    PT Time Calculation (min) 38 min             Past Medical History:  Diagnosis Date   Ankylosing spondylitis ([place])    Anxiety and depression    Depression    Fibromyalgia    GERD (gastroesophageal reflux disease)    Hiatal hernia    per patient, dx by GI    History of basal cell carcinoma excision    FACE, 1992  &  1996   History of hiatal hernia    History of thrush    Hyperlipidemia    Hypertension    Insomnia    Left breast mass    OCD (obsessive compulsive disorder)    Osteopenia    PONV (postoperative nausea and vomiting)    Psoriatic arthritis ([facility])    PVC (premature ventricular contraction)    Rheumatoid arthritis ([place])    Past Surgical History:  Procedure Laterality Date   BIOPSY  09/29/2019   Procedure: BIOPSY;  Surgeon: Ronnette Juniper, MD;  Location: [facility];  Service: Gastroenterology;;   BREAST EXCISIONAL BIOPSY Right    BREAST EXCISIONAL BIOPSY Left 05/04/2018   BREAST LUMPECTOMY WITH RADIOACTIVE SEED LOCALIZATION Left 05/04/2018   Procedure: LEFT BREAST LUMPECTOMY WITH RADIOACTIVE SEED LOCALIZATION AND LEFT BREAST NIPPLE BIOPSY;  Surgeon: Jovita Kussmaul, MD;  Location: [place];  Service: General;  Laterality: Left;   BREAST SURGERY  05/25/1973   lumpectomy-- benign   BUNIONECTOMY  05/25/1989   CATARACT EXTRACTION W/ INTRAOCULAR LENS  IMPLANT, BILATERAL  05/25/1998   CERVICAL FUSION  07/24/2011   C5 -- C7   CESAREAN SECTION  05/26/1983   COLONOSCOPY WITH PROPOFOL N/A  09/29/2019   Procedure: COLONOSCOPY WITH PROPOFOL;  Surgeon: Ronnette Juniper, MD;  Location: [facility];  Service: Gastroenterology;  Laterality: N/A;   DISTAL INTERPHALANGEAL JOINT FUSION Right 03/14/2015   Procedure: RIGHT LONG FINGER DISTAL INTERPHALANGEAL JOINT ARTHRODESIS;  Surgeon: Iran Planas, MD;  Location: [place];  Service: Orthopedics;  Laterality: Right;   ESOPHAGOGASTRODUODENOSCOPY (EGD) WITH PROPOFOL N/A 09/29/2019   Procedure: ESOPHAGOGASTRODUODENOSCOPY (EGD) WITH PROPOFOL;  Surgeon: Ronnette Juniper, MD;  Location: [facility];  Service: Gastroenterology;  Laterality: N/A;   EYE SURGERY  05/25/1993   rk (laser surgery), semi cornea transplant, detacted retina,  fluid removal   HYSTEROSCOPY WITH D & C N/A 12/09/2020   Procedure: DILATATION AND CURETTAGE /HYSTEROSCOPY;  Surgeon: Drema Dallas, DO;  Location: [place];  Service: Gynecology;  Laterality: N/A;   KNEE ARTHROSCOPY Left 03/03/2004   POLYPECTOMY  09/29/2019   Procedure: POLYPECTOMY;  Surgeon: Ronnette Juniper, MD;  Location: [facility];  Service: Gastroenterology;;   POSTERIOR VITRECTOMY RIGHT EYE AND LASER   10/27/1999   SHOULDER SURGERY Right 05/26/1995   SKIN BIOPSY  05/2022   lower stomach   SPINAL FIXATION SURGERY W/ IMPLANT  2013 rod #1//  2014  rod  2   S1 -- T10  (rod #1)//   S1 -- T4 (rod #2)   THORACIC FUSION  03/13/2013   REMOVAL HARDWARE/  BONE GRAFT FUSION T10//  REVISION OF RODS   TOTAL KNEE ARTHROPLASTY Left 12/14/2005   UVULOPALATOPHARYNGOPLASTY  04/26/2006   w/  TONSILLECTOMY/  TURBINATE REDUCTIONS/  BILATERAL ANTERIOR ETHMOIDECTOMY   Patient Active Problem List   Diagnosis Date Noted   Bacterial conjunctivitis of right eye 04/30/2022   Contact dermatitis 04/30/2022   Lesion of liver 06/20/2021   C. difficile colitis 09/29/2019   Colitis 09/28/2019   Anxiety and depression 09/28/2019   DDD (degenerative disc disease), cervical s/p fusion 11/12/2016   H/O total  knee replacement, left 05/06/2016   DDD lumbar spine status post fusion 05/06/2016   Psoriasis 05/05/2016   High risk medication use 05/05/2016   Cervical post-laminectomy syndrome 12/09/2015   Thoracic postlaminectomy syndrome 12/09/2015   Psoriatic arthritis ([place]) 08/23/2012   Osteopenia 08/23/2012   HLD (hyperlipidemia) 08/23/2012   RLS (restless legs syndrome) 08/23/2012   Insomnia 08/23/2012   Fibromyalgia syndrome 08/12/2012   Low back pain 08/12/2012   Syncope    PVC (premature ventricular contraction)    OCD (obsessive compulsive disorder)    GERD (gastroesophageal reflux disease)    Hypertension     REFERRING DIAG: M54.12 (ICD-10-CM) - Radiculopathy, cervical region  THERAPY DIAG:  Cervicalgia  Muscle weakness (generalized)  Rationale for Evaluation and Treatment Rehabilitation  PERTINENT HISTORY:  HTN, PVCs, GERD, hx Cdiff, osteopenia, hx lumbar and cervical fusions (about 10 years ago), fibromyalgia, anxiety/depression, hx of skin cancer  PRECAUTIONS: multiple fusions, remote thoracic compression fractures, osteopenia  SUBJECTIVE:                                                                                                                                                                                      SUBJECTIVE STATEMENT:  I did good after the first visit. I did the exercises without any problems. The neck is a little stiff and achy today.    PAIN:  Are you having pain: 5/10 Location/description: aching in neck Best-worst over past week: 0-9/10  Per eval -  - aggravating factors: walking, laundry, kitchen/dishes, upper body - Easing factors: resting      OBJECTIVE: (objective measures completed at initial evaluation unless otherwise dated)   DIAGNOSTIC FINDINGS:  Thoracic MRI Aug 2023 IMPRESSION: No acute abnormality or change compared to the prior exams.   Remote mild superior endplate compression fracture of T4 and severe compression  fracture T10. The patient is status post decompression at T10 and thoracolumbar fusion without evidence of complication.   PATIENT SURVEYS:  FOTO  38 current, 49 predicted   COGNITION: Overall cognitive status: Within functional limits for tasks assessed   SENSATION: No reported neuro/sensory deficits, red flag questioning reassuring    POSTURE: forward head, kyphosis   PALPATION: Palpable tightness periscapular musculature BIL             CERVICAL ROM:    Active ROM A/PROM (deg) eval AROM 07/29/22  Flexion 75% pull   Extension 50% R sided pain    Right lateral flexion     Left lateral flexion     Right rotation 35 deg 40  Left rotation 50 deg    (Blank rows = not tested) (Key: WFL = within functional limits not formally assessed, * = concordant pain, s = stiffness/stretching sensation, NT = not tested)    UPPER EXTREMITY ROM:   A/PROM Right eval Left eval  Shoulder flexion 132 deg 129 deg  Shoulder abduction      Shoulder internal rotation      Shoulder external rotation      Elbow flexion      Elbow extension      Wrist flexion      Wrist extension       (Blank rows = not tested) (Key: WFL = within functional limits not formally assessed, * = concordant pain, s = stiffness/stretching sensation, NT = not tested)  Comments:     UPPER EXTREMITY MMT:   MMT Right eval Left eval  Shoulder flexion 4 4  Shoulder extension      Shoulder abduction 4 4  Shoulder extension      Shoulder internal rotation      Shoulder external rotation      Elbow flexion      Elbow extension      Grip strength      (Blank rows = not tested)  (Key: WFL = within functional limits not formally assessed, * = concordant pain, s = stiffness/stretching sensation, NT = not tested)  Comments:    CERVICAL SPECIAL TESTS:  Deferred given presence of hardware   FUNCTIONAL TESTS:  Overhead reach limited as above   TODAY'S TREATMENT:                                                                                                                               [facility] Adult PT Treatment:                                                DATE: 07/29/22 Therapeutic Exercise: Nustep L3 UE/LE x 5 minutes  Scapular retraction x10 cues for HEP, monitoring symptoms LS stretch to R x30sec cues for HEP, monitoring symptoms Hooklying  shoulder press 5 sec 10 x 2  Hooklying head press 5 sec 10 x 2  Supine chest press with dowel x 20 Supine pullover with  dowel  x 10 Supine red band horizontal abduction x 15  Ball under head/neck- cervical AROM rotation x 10 alternating, gentle head nods x 10 Seated in standard chair- discussed options for head press and shoulder press while in her recliner  [facility] Adult PT Treatment:                                                DATE: 07/21/22 Therapeutic Exercise: Scapular retraction x10 cues for HEP, monitoring symptoms LS stretch to R x30sec cues for HEP, monitoring symptoms HEP handout + education   PATIENT EDUCATION:  Education details: Pt education on PT impairments, prognosis, and POC. Informed consent. Rationale for interventions, safe/appropriate HEP performance Person educated: Patient Education method: Explanation, Demonstration, Tactile cues, Verbal cues, and Handouts Education comprehension: verbalized understanding, returned demonstration, verbal cues required, tactile cues required, and needs further education     HOME EXERCISE PROGRAM: Access Code: [email or web address] URL: [email or web address] Date: 07/21/2022 Prepared by: Enis Slipper   Exercises - Seated Scapular Retraction  - 1 x daily - 7 x weekly - 3 sets - 10 reps - Gentle Levator Scapulae Stretch  - 1 x daily - 7 x weekly - 2 sets - 3 reps - 30sec  hold 07/29/22 - Supine Shoulder Horizontal Abduction with Resistance  - 1 x daily - 7 x weekly - 1-2 sets - 10 reps   ASSESSMENT:   CLINICAL IMPRESSION: 07/29/22: Patient is a pleasant 71 y.o. woman who was seen today for her 1st  physical therapy treatment for chronic neck pain. Pt states her primary issue is weakness she attributes to prior botox injections, states she is limited in daily activities and once her muscles fatigue she has to lie in bed or recliner the rest of the day. She reports compliance with initial HEP and no increase in pain. Upon sitting unsupported on mat table,  she uses her UE to maintain posture and prevent pain in neck and upper back. Reviewed HEP and she requires min cues for hold time. Her Cervical AROM has improved for rotation. Worked on postural exercise in Supine which she tolerated very well for isometrics and shoulder ROM. Began supine resisted scap strength with good tolerance and this was added to HEP. Asked her to monitor symptoms and discontinue new exercise if causes pain. She reported no increased pain at end of session. Will continue to progress as tolerated.   EVAL: On exam pt does demo postural deficits, reduced cervical/GH mobility. Tolerates HEP well without issue, cues/education provided for appropriate performance. Did advise on avoiding overexertion with HEP and monitoring symptoms as PT goes on given pt report of symptom behavior, pt verbalizes agreement/understanding with plan as outlined below. No adverse events or increases in pain reported today. Recommend skilled PT to address aforementioned deficits and maximize functional tolerance. Pt departs today's session in no acute distress, all voiced questions/concerns addressed appropriately from PT perspective.     OBJECTIVE IMPAIRMENTS: decreased activity tolerance, decreased endurance, decreased mobility, decreased ROM, decreased strength, hypomobility, impaired perceived functional ability, impaired UE functional use, improper body mechanics, postural dysfunction, and pain.    ACTIVITY LIMITATIONS: carrying, lifting, bending, standing, dressing, reach over head, hygiene/grooming, and locomotion level   PARTICIPATION LIMITATIONS:  meal prep, cleaning, laundry, and driving   PERSONAL FACTORS: Age, Time since onset of injury/illness/exacerbation, and 3+ comorbidities: HTN, osteopenia, hx lumbar and  cervical fusions, fibromyalgia, anxiety/depression  are also affecting patient's functional outcome.    REHAB POTENTIAL: Fair given chronicity and comorbidities   CLINICAL DECISION MAKING: Evolving/moderate complexity   EVALUATION COMPLEXITY: Moderate     GOALS: Goals reviewed with patient? No   SHORT TERM GOALS: Target date: 08/18/2022 Pt will demonstrate appropriate understanding and performance of initially prescribed HEP in order to facilitate improved independence with management of symptoms.  Baseline: HEP provided on eval Goal status: INITIAL    2. Pt will score greater than or equal to 44 on FOTO in order to demonstrate improved perception of function due to symptoms.            Baseline: 38            Goal status: INITIAL     LONG TERM GOALS: Target date: 09/15/2022   Pt will score 49 on FOTO in order to demonstrate improved perception of function due to symptoms. Baseline: 38 Goal status: INITIAL   2. Pt will demonstrate at least 45 degrees of active cervical rotation ROM BIL in order to demonstrate improved environmental awareness and safety with driving.  Baseline: see ROM chart above Goal status: INITIAL   3. Pt will demonstrate at least 4+/5 shoulder MMT for improved symmetry of UE strength and improved tolerance to functional movements.  Baseline: see MMT chart above Goal status: INITIAL    4. Pt will report/demonstrate ability to stand/walk for up to 37mn with less than 2 point increase on NPS in order to demonstrate improved tolerance to household activities. Baseline: up to 9/10 pain with prolonged activities Goal status: INITIAL                         5. Pt will report/demonstrate independence with final prescribed HEP in order to facilitate improved self efficacy with management of  symptoms.             Baseline: TBD            Goal status: INITIAL                                     PLAN:   PT FREQUENCY: 2x/week   PT DURATION: 8 weeks   PLANNED INTERVENTIONS: Therapeutic exercises, Therapeutic activity, Neuromuscular re-education, Balance training, Gait training, Patient/Family education, Self Care, Vestibular training, Dry Needling, Cryotherapy, Moist heat, Taping, Manual therapy, and Re-evaluation   PLAN FOR NEXT SESSION: gentle postural stability/strengthening, cervical mobility as appropriate. Consider STM to reduce tightness PRN.         JHessie Diener PTA 07/29/22 11:57 AM Phone: [phone]: [phone]

## 2022-07-30 ENCOUNTER — Encounter: Payer: Self-pay | Admitting: Family Medicine

## 2022-07-30 ENCOUNTER — Ambulatory Visit (INDEPENDENT_AMBULATORY_CARE_PROVIDER_SITE_OTHER): Payer: Medicare Other | Admitting: Family Medicine

## 2022-07-30 VITALS — BP 122/68 | HR 83 | Temp 98.0°F | Ht 63.0 in | Wt 198.0 lb

## 2022-07-30 DIAGNOSIS — L650 Telogen effluvium: Secondary | ICD-10-CM | POA: Diagnosis not present

## 2022-07-30 DIAGNOSIS — Z86010 Personal history of colonic polyps: Secondary | ICD-10-CM | POA: Insufficient documentation

## 2022-07-30 DIAGNOSIS — R9389 Abnormal findings on diagnostic imaging of other specified body structures: Secondary | ICD-10-CM | POA: Insufficient documentation

## 2022-07-30 NOTE — Progress Notes (Signed)
Subjective:    Patient ID: Janet Peterson, female    DOB: Nov 10, 1950, 72 y.o.   MRN: [id number]  Recently, the patient has experienced global hair loss.  There are large patches of total alopecia all throughout her scalp.  There are not sharp borders.  It does not appear to be alopecia areata.  I performed a hair pull test today and was able to pull 20 hairs out gently without effort.  They appear to be telogen hairs with a round bulb at the end.  She denies fevers or chills or weight loss.  She does have emotional stress but she has not had any significant physical stress.  Of note she is on arava.  There is a 10 to 17% reported rate of alopecia on this medication however she has been on this for years.  She denies any rash.  Past Medical History:  Diagnosis Date   Ankylosing spondylitis ([place])    Anxiety and depression    Depression    Fibromyalgia    GERD (gastroesophageal reflux disease)    Hiatal hernia    per patient, dx by GI    History of basal cell carcinoma excision    FACE, 1992  &  1996   History of hiatal hernia    History of thrush    Hyperlipidemia    Hypertension    Insomnia    Left breast mass    OCD (obsessive compulsive disorder)    Osteopenia    PONV (postoperative nausea and vomiting)    Psoriatic arthritis (HCC)    PVC (premature ventricular contraction)    Rheumatoid arthritis ([place])    Past Surgical History:  Procedure Laterality Date   BIOPSY  09/29/2019   Procedure: BIOPSY;  Surgeon: Ronnette Juniper, MD;  Location: [facility];  Service: Gastroenterology;;   BREAST EXCISIONAL BIOPSY Right    BREAST EXCISIONAL BIOPSY Left 05/04/2018   BREAST LUMPECTOMY WITH RADIOACTIVE SEED LOCALIZATION Left 05/04/2018   Procedure: LEFT BREAST LUMPECTOMY WITH RADIOACTIVE SEED LOCALIZATION AND LEFT BREAST NIPPLE BIOPSY;  Surgeon: Jovita Kussmaul, MD;  Location: [name];  Service: General;  Laterality: Left;   BREAST SURGERY  05/25/1973   lumpectomy-- benign    BUNIONECTOMY  05/25/1989   CATARACT EXTRACTION W/ INTRAOCULAR LENS  IMPLANT, BILATERAL  05/25/1998   CERVICAL FUSION  07/24/2011   C5 -- C7   CESAREAN SECTION  05/26/1983   COLONOSCOPY WITH PROPOFOL N/A 09/29/2019   Procedure: COLONOSCOPY WITH PROPOFOL;  Surgeon: Ronnette Juniper, MD;  Location: [facility];  Service: Gastroenterology;  Laterality: N/A;   DISTAL INTERPHALANGEAL JOINT FUSION Right 03/14/2015   Procedure: RIGHT LONG FINGER DISTAL INTERPHALANGEAL JOINT ARTHRODESIS;  Surgeon: Iran Planas, MD;  Location: [place];  Service: Orthopedics;  Laterality: Right;   ESOPHAGOGASTRODUODENOSCOPY (EGD) WITH PROPOFOL N/A 09/29/2019   Procedure: ESOPHAGOGASTRODUODENOSCOPY (EGD) WITH PROPOFOL;  Surgeon: Ronnette Juniper, MD;  Location: [facility];  Service: Gastroenterology;  Laterality: N/A;   EYE SURGERY  05/25/1993   rk (laser surgery), semi cornea transplant, detacted retina,  fluid removal   HYSTEROSCOPY WITH D & C N/A 12/09/2020   Procedure: DILATATION AND CURETTAGE /HYSTEROSCOPY;  Surgeon: Drema Dallas, DO;  Location: [place];  Service: Gynecology;  Laterality: N/A;   KNEE ARTHROSCOPY Left 03/03/2004   POLYPECTOMY  09/29/2019   Procedure: POLYPECTOMY;  Surgeon: Ronnette Juniper, MD;  Location: [facility];  Service: Gastroenterology;;   POSTERIOR VITRECTOMY RIGHT EYE AND LASER   10/27/1999   SHOULDER  SURGERY Right 05/26/1995   SKIN BIOPSY  05/2022   lower stomach   SPINAL FIXATION SURGERY W/ IMPLANT  2013 rod #1//  2014  rod 2   S1 -- T10  (rod #1)//   S1 -- T4 (rod #2)   THORACIC FUSION  03/13/2013   REMOVAL HARDWARE/  BONE GRAFT FUSION T10//  REVISION OF RODS   TOTAL KNEE ARTHROPLASTY Left 12/14/2005   UVULOPALATOPHARYNGOPLASTY  04/26/2006   w/  TONSILLECTOMY/  TURBINATE REDUCTIONS/  BILATERAL ANTERIOR ETHMOIDECTOMY   Current Outpatient Medications on File Prior to Visit  Medication Sig Dispense Refill   buPROPion (WELLBUTRIN XL) 300 MG 24 hr tablet  Take 300 mg by mouth every morning.  1   ciprofloxacin (CILOXAN) 0.3 % ophthalmic solution Place 2 drops into the right eye every 2 (two) hours. Administer 1 drop, every 2 hours, while awake, for 2 days. Then 1 drop, every 4 hours, while awake, for the next 5 days. (Patient taking differently: Place 2 drops into the right eye as needed. Administer 1 drop, every 2 hours, while awake, for 2 days. Then 1 drop, every 4 hours, while awake, for the next 5 days.) 5 mL 0   clonazePAM (KLONOPIN) 1 MG tablet TAKE 1 TABLET BY MOUTH EVERY DAY AS NEEDED FOR ANXIETY (Patient taking differently: Take 1 mg by mouth daily as needed for anxiety.) 30 tablet 1   dexlansoprazole (DEXILANT) 60 MG capsule TAKE 1 CAPSULE BY MOUTH EVERY DAY 90 capsule 1   doxazosin (CARDURA) 2 MG tablet TAKE 1 TABLET BY MOUTH EVERY DAY 90 tablet 0   doxepin (SINEQUAN) 50 MG capsule Take 50 mg by mouth.     ezetimibe (ZETIA) 10 MG tablet TAKE 1 TABLET BY MOUTH EVERY DAY 90 tablet 3   FLUoxetine (PROZAC) 20 MG capsule Take 20 mg by mouth 3 (three) times daily.     fluticasone (FLONASE) 50 MCG/ACT nasal spray Place 2 sprays into both nostrils daily as needed for allergies. 16 g 11   hydrochlorothiazide (HYDRODIURIL) 25 MG tablet TAKE 1 TABLET (25 MG TOTAL) BY MOUTH DAILY. (Patient not taking: Reported on 06/10/2022) 90 tablet 3   hydrocortisone cream 1 % Apply 1 Application topically 2 (two) times daily. 30 g 0   INGREZZA 80 MG capsule Take one capsule by mouth daily 30 capsule 1   leflunomide (ARAVA) 20 MG tablet TAKE 1 TABLET BY MOUTH EVERY DAY 90 tablet 0   linaclotide (LINZESS) 72 MCG capsule Take 1 capsule (72 mcg total) by mouth daily before breakfast. (Patient taking differently: Take 72 mcg by mouth as needed.) 90 capsule 3   metoprolol succinate (TOPROL-XL) 50 MG 24 hr tablet TAKE 1 TABLET BY MOUTH ONCE DAILY FOLLOWING A MEAL 90 tablet 1   ondansetron (ZOFRAN) 4 MG tablet Take 4 mg by mouth 4 (four) times daily as needed for nausea.      oxyCODONE-acetaminophen (PERCOCET) 5-325 MG tablet Take 1 tablet by mouth every 8 (eight) hours as needed for severe pain. 60 tablet 0   pregabalin (LYRICA) 100 MG capsule TAKE 1 CAPSULE BY MOUTH TWICE A DAY 60 capsule 5   pregabalin (LYRICA) 75 MG capsule Take 1 capsule (75 mg total) by mouth 2 (two) times daily. (Patient not taking: Reported on 06/10/2022) 60 capsule 0   tiZANidine (ZANAFLEX) 4 MG capsule Take 1 capsule (4 mg total) by mouth 3 (three) times daily. (Patient taking differently: Take 4 mg by mouth as needed.) 30 capsule 1   valbenazine (INGREZZA)  80 MG capsule Take 1 capsule (80 mg total) by mouth daily. (Patient not taking: Reported on 06/10/2022) 90 capsule 3   valsartan (DIOVAN) 320 MG tablet TAKE 1 TABLET BY MOUTH EVERY DAY 90 tablet 3   No current facility-administered medications on file prior to visit.   Allergies  Allergen Reactions   Latex Rash    And Mouth sores Other reaction(s): rash Other reaction(s): rash Other reaction(s): rash   Amlodipine Besy-Benazepril Hcl Other (See Comments)    COUGH   Amoxicillin-Pot Clavulanate Diarrhea and Nausea And Vomiting    Other reaction(s): sick on stomach Other reaction(s): sick on stomach   Bextra [Valdecoxib] Diarrhea and Nausea And Vomiting    REFLUX   Chlorhexidine     rash Other reaction(s): rash all over body Other reaction(s): rash all over body   Lipitor [Atorvastatin Calcium] Other (See Comments)    MYALGIAS   Sulfa Antibiotics Nausea And Vomiting    CRAMPING Other reaction(s): stomach hurts Other reaction(s): stomach hurts   Zocor [Simvastatin] Other (See Comments)    MYALGIA   Social History   Socioeconomic History   Marital status: Married    Spouse name: Daryl   Number of children: Not on file   Years of education: Not on file   Highest education level: Not on file  Occupational History    Comment: retired  Tobacco Use   Smoking status: Former    Packs/day: 0.50    Years: 10.00    Total pack  years: 5.00    Types: Cigarettes    Quit date: 03/07/1995    Years since quitting: 27.4    Passive exposure: Never   Smokeless tobacco: Never  Vaping Use   Vaping Use: Never used  Substance and Sexual Activity   Alcohol use: Yes    Comment: rare   Drug use: Never   Sexual activity: Not on file  Other Topics Concern   Not on file  Social History Narrative   Lives with husband   Social Determinants of Health   Financial Resource Strain: Low Risk  (08/07/2021)   Overall Financial Resource Strain (CARDIA)    Difficulty of Paying Living Expenses: Not hard at all  Food Insecurity: No Food Insecurity (08/07/2021)   Hunger Vital Sign    Worried About Running Out of Food in the Last Year: Never true    Ran Out of Food in the Last Year: Never true  Transportation Needs: No Transportation Needs (08/07/2021)   PRAPARE - Hydrologist (Medical): No    Lack of Transportation (Non-Medical): No  Physical Activity: Inactive (08/07/2021)   Exercise Vital Sign    Days of Exercise per Week: 0 days    Minutes of Exercise per Session: 0 min  Stress: No Stress Concern Present (08/07/2021)   [place]    Feeling of Stress : Not at all  Social Connections: [place] (08/07/2021)   Social Connection and Isolation Panel [NHANES]    Frequency of Communication with Friends and Family: More than three times a week    Frequency of Social Gatherings with Friends and Family: More than three times a week    Attends Religious Services: More than 4 times per year    Active Member of [employer] or Organizations: Yes    Attends Archivist Meetings: More than 4 times per year    Marital Status: Married  Human resources officer Violence: Not At Risk (08/07/2021)  Humiliation, Afraid, Rape, and Kick questionnaire    Fear of Current or Ex-Partner: No    Emotionally Abused: No    Physically Abused: No    Sexually  Abused: No    Review of Systems  Gastrointestinal:  Positive for abdominal pain.  All other systems reviewed and are negative.      Objective:   Physical Exam Vitals reviewed.  Constitutional:      General: She is not in acute distress.    Appearance: Normal appearance. She is normal weight. She is not ill-appearing or toxic-appearing.  Cardiovascular:     Rate and Rhythm: Normal rate and regular rhythm.     Heart sounds: Normal heart sounds. No murmur heard.    No friction rub. No gallop.  Pulmonary:     Effort: Pulmonary effort is normal. No respiratory distress.     Breath sounds: Normal breath sounds. No wheezing, rhonchi or rales.  Abdominal:     General: Abdomen is flat. Bowel sounds are normal. There is no distension.     Palpations: Abdomen is soft.     Tenderness: There is no abdominal tenderness.  Musculoskeletal:     Right lower leg: No edema.     Left lower leg: No edema.  Neurological:     Mental Status: She is alert.      Global thinning of the hair all throughout the scalp with irregular patches of total alopecia.  It is nonscarring.  Borders are not sharp.     Assessment & Plan:  Telogen effluvium - Plan: CBC with Differential/Platelet, TSH, COMPLETE METABOLIC PANEL WITH GFR, ANA, Iron, Zinc, Sedimentation rate, ANA I believe this represents a Telogen effluvium.  First look for reversible causes.  I will check a CBC TSH CMP ANA iron stain sed rate.  If labs are normal I have asked her to call the doctor prescribing arava and see if they feel she should hold this.  If not, we could consider minoxidil and/or finasteride at that time

## 2022-07-31 ENCOUNTER — Other Ambulatory Visit: Payer: Self-pay

## 2022-07-31 ENCOUNTER — Telehealth: Payer: Self-pay | Admitting: *Deleted

## 2022-07-31 MED ORDER — FINASTERIDE 5 MG PO TABS: 2.5000 mg | ORAL_TABLET | Freq: Every day | ORAL | 0 refills | Status: DC

## 2022-07-31 NOTE — Telephone Encounter (Signed)
Patient advised if she has been on [place] for the last several years then it should not cause sudden hair loss.  Dr. Estanislado Pandy  would recommend waiting and observing.  Sometimes people have hair loss during the winter months which improved by itself. Patient expressed understanding and is in agreement.

## 2022-07-31 NOTE — Telephone Encounter (Signed)
If she has been on [place] for the last several years then it should not cause sudden hair loss.  I would recommend waiting and observing.  Sometimes people have hair loss during the winter months which improved by itself.

## 2022-07-31 NOTE — Telephone Encounter (Signed)
Patient contacted the office and states she is on the [place]. Patient she has started to notice hair loss in the last 2-3 weeks. Patient states she has been on the [place] for several years without any problems. Patient states she has not had any new hair care products. Patient states she has seen her PCP and he has done some blood work for her. Patient states she has not had any medication changes. Patient would like to know if it might be the [place].

## 2022-08-01 LAB — COMPLETE METABOLIC PANEL WITH GFR
AG Ratio: 1.5 (calc) (ref 1.0–2.5)
ALT: 5 U/L — ABNORMAL LOW (ref 6–29)
AST: 10 U/L (ref 10–35)
Albumin: 3.7 g/dL (ref 3.6–5.1)
Alkaline phosphatase (APISO): 94 U/L (ref 37–153)
BUN: 10 mg/dL (ref 7–25)
CO2: 28 mmol/L (ref 20–32)
Calcium: 9.8 mg/dL (ref 8.6–10.4)
Chloride: 99 mmol/L (ref 98–110)
Creat: 0.68 mg/dL (ref 0.60–1.00)
Globulin: 2.4 g/dL (calc) (ref 1.9–3.7)
Glucose, Bld: 93 mg/dL (ref 65–99)
Potassium: 4 mmol/L (ref 3.5–5.3)
Sodium: 134 mmol/L — ABNORMAL LOW (ref 135–146)
Total Bilirubin: 0.3 mg/dL (ref 0.2–1.2)
Total Protein: 6.1 g/dL (ref 6.1–8.1)
eGFR: 92 mL/min/{1.73_m2} (ref >=60)

## 2022-08-01 LAB — CBC WITH DIFFERENTIAL/PLATELET
Absolute Monocytes: 414 cells/uL (ref 200–950)
Basophils Absolute: 50 cells/uL (ref 0–200)
Basophils Relative: 1.1 %
Eosinophils Absolute: 90 cells/uL (ref 15–500)
Eosinophils Relative: 2 %
HCT: 34.1 % — ABNORMAL LOW (ref 35.0–45.0)
Hemoglobin: 11.1 g/dL — ABNORMAL LOW (ref 11.7–15.5)
Lymphs Abs: 1404 cells/uL (ref 850–3900)
MCH: 30.8 pg (ref 27.0–33.0)
MCHC: 32.6 g/dL (ref 32.0–36.0)
MCV: 94.7 fL (ref 80.0–100.0)
MPV: 10.9 fL (ref 7.5–12.5)
Monocytes Relative: 9.2 %
Neutro Abs: 2543 cells/uL (ref 1500–7800)
Neutrophils Relative %: 56.5 %
Platelets: 237 10*3/uL (ref 140–400)
RBC: 3.6 10*6/uL — ABNORMAL LOW (ref 3.80–5.10)
RDW: 12.9 % (ref 11.0–15.0)
Total Lymphocyte: 31.2 %
WBC: 4.5 10*3/uL (ref 3.8–10.8)

## 2022-08-01 LAB — IRON: Iron: 83 ug/dL (ref 45–160)

## 2022-08-01 LAB — TSH: TSH: 1.22 mIU/L (ref 0.40–4.50)

## 2022-08-01 LAB — SEDIMENTATION RATE: Sed Rate: 11 mm/h (ref 0–30)

## 2022-08-01 LAB — ANA: Anti Nuclear Antibody (ANA): NEGATIVE

## 2022-08-03 ENCOUNTER — Other Ambulatory Visit: Payer: Self-pay

## 2022-08-03 DIAGNOSIS — L650 Telogen effluvium: Secondary | ICD-10-CM

## 2022-08-03 MED ORDER — FINASTERIDE 5 MG PO TABS: 2.5000 mg | ORAL_TABLET | Freq: Every day | ORAL | 1 refills | Status: DC

## 2022-08-04 ENCOUNTER — Encounter: Payer: Self-pay | Admitting: Physical Therapy

## 2022-08-04 ENCOUNTER — Ambulatory Visit: Payer: Medicare Other | Admitting: Physical Therapy

## 2022-08-04 DIAGNOSIS — M6281 Muscle weakness (generalized): Secondary | ICD-10-CM

## 2022-08-04 DIAGNOSIS — R293 Abnormal posture: Secondary | ICD-10-CM

## 2022-08-04 DIAGNOSIS — M542 Cervicalgia: Secondary | ICD-10-CM

## 2022-08-04 NOTE — Therapy (Signed)
OUTPATIENT PHYSICAL THERAPY TREATMENT NOTE   Patient Name: Janet Peterson MRN: [id number] DOB:[birth year], 72 y.o., female Today's Date: 08/04/2022  PCP: Susy Frizzle, MD   REFERRING PROVIDER: Loanne Drilling, PA-C    END OF SESSION:   PT End of Session - 08/04/22 1500     Visit Number 3    Number of Visits 17    Date for PT Re-Evaluation 09/15/22    Authorization Type [employer] MCR    Progress Note Due on Visit 10    PT Start Time 1501    Activity Tolerance Patient tolerated treatment well;No increased pain    Behavior During Therapy WFL for tasks assessed/performed              Past Medical History:  Diagnosis Date   Ankylosing spondylitis ([place])    Anxiety and depression    Depression    Fibromyalgia    GERD (gastroesophageal reflux disease)    Hiatal hernia    per patient, dx by GI    History of basal cell carcinoma excision    FACE, 1992  &  1996   History of hiatal hernia    History of thrush    Hyperlipidemia    Hypertension    Insomnia    Left breast mass    OCD (obsessive compulsive disorder)    Osteopenia    PONV (postoperative nausea and vomiting)    Psoriatic arthritis (HCC)    PVC (premature ventricular contraction)    Rheumatoid arthritis ([place])    Past Surgical History:  Procedure Laterality Date   BIOPSY  09/29/2019   Procedure: BIOPSY;  Surgeon: Ronnette Juniper, MD;  Location: [facility];  Service: Gastroenterology;;   BREAST EXCISIONAL BIOPSY Right    BREAST EXCISIONAL BIOPSY Left 05/04/2018   BREAST LUMPECTOMY WITH RADIOACTIVE SEED LOCALIZATION Left 05/04/2018   Procedure: LEFT BREAST LUMPECTOMY WITH RADIOACTIVE SEED LOCALIZATION AND LEFT BREAST NIPPLE BIOPSY;  Surgeon: Jovita Kussmaul, MD;  Location: [facility];  Service: General;  Laterality: Left;   BREAST SURGERY  05/25/1973   lumpectomy-- benign   BUNIONECTOMY  05/25/1989   CATARACT EXTRACTION W/ INTRAOCULAR LENS  IMPLANT, BILATERAL  05/25/1998   CERVICAL FUSION  07/24/2011    C5 -- C7   CESAREAN SECTION  05/26/1983   COLONOSCOPY WITH PROPOFOL N/A 09/29/2019   Procedure: COLONOSCOPY WITH PROPOFOL;  Surgeon: Ronnette Juniper, MD;  Location: [facility];  Service: Gastroenterology;  Laterality: N/A;   DISTAL INTERPHALANGEAL JOINT FUSION Right 03/14/2015   Procedure: RIGHT LONG FINGER DISTAL INTERPHALANGEAL JOINT ARTHRODESIS;  Surgeon: Iran Planas, MD;  Location: [facility];  Service: Orthopedics;  Laterality: Right;   ESOPHAGOGASTRODUODENOSCOPY (EGD) WITH PROPOFOL N/A 09/29/2019   Procedure: ESOPHAGOGASTRODUODENOSCOPY (EGD) WITH PROPOFOL;  Surgeon: Ronnette Juniper, MD;  Location: [facility];  Service: Gastroenterology;  Laterality: N/A;   EYE SURGERY  05/25/1993   rk (laser surgery), semi cornea transplant, detacted retina,  fluid removal   HYSTEROSCOPY WITH D & C N/A 12/09/2020   Procedure: DILATATION AND CURETTAGE /HYSTEROSCOPY;  Surgeon: Drema Dallas, DO;  Location: [employer] [facility];  Service: Gynecology;  Laterality: N/A;   KNEE ARTHROSCOPY Left 03/03/2004   POLYPECTOMY  09/29/2019   Procedure: POLYPECTOMY;  Surgeon: Ronnette Juniper, MD;  Location: [facility];  Service: Gastroenterology;;   POSTERIOR VITRECTOMY RIGHT EYE AND LASER   10/27/1999   SHOULDER SURGERY Right 05/26/1995   SKIN BIOPSY  05/2022   lower stomach   SPINAL FIXATION SURGERY W/ IMPLANT  2013  rod #1//  2014  rod 2   S1 -- T10  (rod #1)//   S1 -- T4 (rod #2)   THORACIC FUSION  03/13/2013   REMOVAL HARDWARE/  BONE GRAFT FUSION T10//  REVISION OF RODS   TOTAL KNEE ARTHROPLASTY Left 12/14/2005   UVULOPALATOPHARYNGOPLASTY  04/26/2006   w/  TONSILLECTOMY/  TURBINATE REDUCTIONS/  BILATERAL ANTERIOR ETHMOIDECTOMY   Patient Active Problem List   Diagnosis Date Noted   Thickened endometrium 07/30/2022   History of adenomatous polyp of colon 07/30/2022   Bacterial conjunctivitis of right eye 04/30/2022   Contact dermatitis 04/30/2022   Lesion of liver 06/20/2021   C.  difficile colitis 09/29/2019   Colitis 09/28/2019   Anxiety and depression 09/28/2019   DDD (degenerative disc disease), cervical s/p fusion 11/12/2016   H/O total knee replacement, left 05/06/2016   DDD lumbar spine status post fusion 05/06/2016   Psoriasis 05/05/2016   High risk medication use 05/05/2016   Cervical post-laminectomy syndrome 12/09/2015   Thoracic postlaminectomy syndrome 12/09/2015   Psoriatic arthritis ([place]) 08/23/2012   Osteopenia 08/23/2012   HLD (hyperlipidemia) 08/23/2012   RLS (restless legs syndrome) 08/23/2012   Insomnia 08/23/2012   Fibromyalgia syndrome 08/12/2012   Low back pain 08/12/2012   Syncope    PVC (premature ventricular contraction)    OCD (obsessive compulsive disorder)    GERD (gastroesophageal reflux disease)    Hypertension     REFERRING DIAG: M54.12 (ICD-10-CM) - Radiculopathy, cervical region  THERAPY DIAG:  Cervicalgia  Muscle weakness (generalized)  Abnormal posture  Rationale for Evaluation and Treatment Rehabilitation  PERTINENT HISTORY:  HTN, PVCs, GERD, hx Cdiff, osteopenia, hx lumbar and cervical fusions (about 10 years ago), fibromyalgia, anxiety/depression, hx of skin cancer  PRECAUTIONS: multiple fusions, remote thoracic compression fractures, osteopenia  SUBJECTIVE:                                                                                                                                                                                      SUBJECTIVE STATEMENT:  "Im not feeling much different than the last session."   PAIN:  Are you having pain: 6/10 Location/description: aching in neck Best-worst over past week: 0-9/10  Per eval -  - aggravating factors: walking, laundry, kitchen/dishes, upper body - Easing factors: resting      OBJECTIVE: (objective measures completed at initial evaluation unless otherwise dated)   DIAGNOSTIC FINDINGS:  Thoracic MRI Aug 2023 IMPRESSION: No acute abnormality or  change compared to the prior exams.   Remote mild superior endplate compression fracture of T4 and severe compression fracture T10. The patient is status post decompression at T10 and thoracolumbar fusion without evidence  of complication.   PATIENT SURVEYS:  FOTO 38 current, 32 predicted   COGNITION: Overall cognitive status: Within functional limits for tasks assessed   SENSATION: No reported neuro/sensory deficits, red flag questioning reassuring    POSTURE: forward head, kyphosis   PALPATION: Palpable tightness periscapular musculature BIL             CERVICAL ROM:    Active ROM A/PROM (deg) eval AROM 07/29/22  Flexion 75% pull   Extension 50% R sided pain    Right lateral flexion     Left lateral flexion     Right rotation 35 deg 40  Left rotation 50 deg    (Blank rows = not tested) (Key: WFL = within functional limits not formally assessed, * = concordant pain, s = stiffness/stretching sensation, NT = not tested)    UPPER EXTREMITY ROM:   A/PROM Right eval Left eval  Shoulder flexion 132 deg 129 deg  Shoulder abduction      Shoulder internal rotation      Shoulder external rotation      Elbow flexion      Elbow extension      Wrist flexion      Wrist extension       (Blank rows = not tested) (Key: WFL = within functional limits not formally assessed, * = concordant pain, s = stiffness/stretching sensation, NT = not tested)  Comments:     UPPER EXTREMITY MMT:   MMT Right eval Left eval  Shoulder flexion 4 4  Shoulder extension      Shoulder abduction 4 4  Shoulder extension      Shoulder internal rotation      Shoulder external rotation      Elbow flexion      Elbow extension      Grip strength      (Blank rows = not tested)  (Key: WFL = within functional limits not formally assessed, * = concordant pain, s = stiffness/stretching sensation, NT = not tested)  Comments:    CERVICAL SPECIAL TESTS:  Deferred given presence of hardware   FUNCTIONAL  TESTS:  Overhead reach limited as above   TODAY'S TREATMENT:                                                                                                                              [facility] Adult PT Treatment:                                                DATE: 08/04/22 Therapeutic Exercise: Supine Chin tuck 1 x 10  Cervical rotation/ sidebending isometrics 1 x 10 ea holding 5 sec ea.  Levator scapulae stretch 2 x 30 sec Upper trap stretch 2 x 30 sec Scapular retraction 1 x 10 hold  Updated HEP for cervical isometrics,  seated/ supine chin tuck. Manual Therapy: Sub-occipital release - taught how it can be done with tennis balls  Tack and stretch of cervical paraspinals/ sub-occipitals MTPR along the cervical parapsinals/ bil upper traps How to perform self trigger point release with theracane    [facility] Adult PT Treatment:                                                DATE: 07/29/22 Therapeutic Exercise: Nustep L3 UE/LE x 5 minutes  Scapular retraction x10 cues for HEP, monitoring symptoms LS stretch to R x30sec cues for HEP, monitoring symptoms Hooklying  shoulder press 5 sec 10 x 2  Hooklying head press 5 sec 10 x 2  Supine chest press with dowel x 20 Supine pullover with dowel  x 10 Supine red band horizontal abduction x 15  Ball under head/neck- cervical AROM rotation x 10 alternating, gentle head nods x 10 Seated in standard chair- discussed options for head press and shoulder press while in her recliner  [facility] Adult PT Treatment:                                                DATE: 07/21/22 Therapeutic Exercise: Scapular retraction x10 cues for HEP, monitoring symptoms LS stretch to R x30sec cues for HEP, monitoring symptoms HEP handout + education   PATIENT EDUCATION:  Education details: Pt education on PT impairments, prognosis, and POC. Informed consent. Rationale for interventions, safe/appropriate HEP performance Person educated: Patient Education method: Explanation,  Demonstration, Tactile cues, Verbal cues, and Handouts Education comprehension: verbalized understanding, returned demonstration, verbal cues required, tactile cues required, and needs further education     HOME EXERCISE PROGRAM: Access Code: [email or web address] URL: [email or web address] Date: 08/04/2022 Prepared by: Starr Lake  Exercises - Seated Scapular Retraction  - 1 x daily - 7 x weekly - 3 sets - 10 reps - Gentle Levator Scapulae Stretch  - 1 x daily - 7 x weekly - 2 sets - 3 reps - 30sec  hold - Supine Shoulder Horizontal Abduction with Resistance  - 1 x daily - 7 x weekly - 1-2 sets - 10 reps - Seated Isometric Cervical Rotation  - 1 x daily - 7 x weekly - 2 sets - 10 reps - 5 sec hold - Seated Isometric Cervical Sidebending  - 1 x daily - 7 x weekly - 2 sets - 10 reps - 5 sec hold - Seated Cervical Retraction  - 1 x daily - 7 x weekly - 2 sets - 10 reps - 5 sec hold - Supine Chin Tuck with Towel  - 1 x daily - 7 x weekly - 2 sets - 10 reps - 5 seconds hold   ASSESSMENT:   CLINICAL IMPRESSION:  Mrs Clare Charon arrives to session reporting pain today is at 6/10 in the back of her neck. Focused STW / sub-occipital release with tack and stretch techniques. She noted improvement in pain following manual techniques. She responded well to cervical isometrics to promote stability. Reviewed HEp and updated to include cervical isometrics. End of session she noted pain dropped from 6/10 to 3/10, and was able to perform cervical flexion/ extension with decreased issue.  EVAL: On exam pt does demo postural deficits,  reduced cervical/GH mobility. Tolerates HEP well without issue, cues/education provided for appropriate performance. Did advise on avoiding overexertion with HEP and monitoring symptoms as PT goes on given pt report of symptom behavior, pt verbalizes agreement/understanding with plan as outlined below. No adverse events or increases in pain reported today. Recommend skilled PT to  address aforementioned deficits and maximize functional tolerance. Pt departs today's session in no acute distress, all voiced questions/concerns addressed appropriately from PT perspective.     OBJECTIVE IMPAIRMENTS: decreased activity tolerance, decreased endurance, decreased mobility, decreased ROM, decreased strength, hypomobility, impaired perceived functional ability, impaired UE functional use, improper body mechanics, postural dysfunction, and pain.    ACTIVITY LIMITATIONS: carrying, lifting, bending, standing, dressing, reach over head, hygiene/grooming, and locomotion level   PARTICIPATION LIMITATIONS: meal prep, cleaning, laundry, and driving   PERSONAL FACTORS: Age, Time since onset of injury/illness/exacerbation, and 3+ comorbidities: HTN, osteopenia, hx lumbar and cervical fusions, fibromyalgia, anxiety/depression  are also affecting patient's functional outcome.    REHAB POTENTIAL: Fair given chronicity and comorbidities   CLINICAL DECISION MAKING: Evolving/moderate complexity   EVALUATION COMPLEXITY: Moderate     GOALS: Goals reviewed with patient? No   SHORT TERM GOALS: Target date: 08/18/2022 Pt will demonstrate appropriate understanding and performance of initially prescribed HEP in order to facilitate improved independence with management of symptoms.  Baseline: HEP provided on eval Goal status: INITIAL    2. Pt will score greater than or equal to 44 on FOTO in order to demonstrate improved perception of function due to symptoms.            Baseline: 38            Goal status: INITIAL     LONG TERM GOALS: Target date: 09/15/2022   Pt will score 49 on FOTO in order to demonstrate improved perception of function due to symptoms. Baseline: 38 Goal status: INITIAL   2. Pt will demonstrate at least 45 degrees of active cervical rotation ROM BIL in order to demonstrate improved environmental awareness and safety with driving.  Baseline: see ROM chart above Goal  status: INITIAL   3. Pt will demonstrate at least 4+/5 shoulder MMT for improved symmetry of UE strength and improved tolerance to functional movements.  Baseline: see MMT chart above Goal status: INITIAL    4. Pt will report/demonstrate ability to stand/walk for up to 4mn with less than 2 point increase on NPS in order to demonstrate improved tolerance to household activities. Baseline: up to 9/10 pain with prolonged activities Goal status: INITIAL                         5. Pt will report/demonstrate independence with final prescribed HEP in order to facilitate improved self efficacy with management of symptoms.             Baseline: TBD            Goal status: INITIAL                                     PLAN:   PT FREQUENCY: 2x/week   PT DURATION: 8 weeks   PLANNED INTERVENTIONS: Therapeutic exercises, Therapeutic activity, Neuromuscular re-education, Balance training, Gait training, Patient/Family education, Self Care, Vestibular training, Dry Needling, Cryotherapy, Moist heat, Taping, Manual therapy, and Re-evaluation   PLAN FOR NEXT SESSION: gentle postural stability/strengthening, cervical mobility as appropriate. Consider STM  to reduce tightness PRN.         Hessie Diener, PTA 08/04/22 3:58 PM Phone: [phone] Fax: [phone]

## 2022-08-05 NOTE — Therapy (Signed)
OUTPATIENT PHYSICAL THERAPY TREATMENT NOTE   Patient Name: Janet Peterson MRN: [id number] DOB:12-26-1950, 72 y.o., female Today's Date: 08/06/2022  PCP: Susy Frizzle, MD   REFERRING PROVIDER: Loanne Drilling, PA-C    END OF SESSION:   PT End of Session - 08/06/22 1501     Visit Number 4    Number of Visits 17    Date for PT Re-Evaluation 09/15/22    Authorization Type [employer] MCR    Progress Note Due on Visit 10    PT Start Time 1502    PT Stop Time [id number]    PT Time Calculation (min) 42 min    Activity Tolerance Patient tolerated treatment well;No increased pain    Behavior During Therapy WFL for tasks assessed/performed               Past Medical History:  Diagnosis Date   Ankylosing spondylitis ([place])    Anxiety and depression    Depression    Fibromyalgia    GERD (gastroesophageal reflux disease)    Hiatal hernia    per patient, dx by GI    History of basal cell carcinoma excision    FACE, 1992  &  1996   History of hiatal hernia    History of thrush    Hyperlipidemia    Hypertension    Insomnia    Left breast mass    OCD (obsessive compulsive disorder)    Osteopenia    PONV (postoperative nausea and vomiting)    Psoriatic arthritis ([facility])    PVC (premature ventricular contraction)    Rheumatoid arthritis ([place])    Past Surgical History:  Procedure Laterality Date   BIOPSY  09/29/2019   Procedure: BIOPSY;  Surgeon: Ronnette Juniper, MD;  Location: [facility];  Service: Gastroenterology;;   BREAST EXCISIONAL BIOPSY Right    BREAST EXCISIONAL BIOPSY Left 05/04/2018   BREAST LUMPECTOMY WITH RADIOACTIVE SEED LOCALIZATION Left 05/04/2018   Procedure: LEFT BREAST LUMPECTOMY WITH RADIOACTIVE SEED LOCALIZATION AND LEFT BREAST NIPPLE BIOPSY;  Surgeon: Jovita Kussmaul, MD;  Location: [place];  Service: General;  Laterality: Left;   BREAST SURGERY  05/25/1973   lumpectomy-- benign   BUNIONECTOMY  05/25/1989   CATARACT EXTRACTION W/ INTRAOCULAR LENS   IMPLANT, BILATERAL  05/25/1998   CERVICAL FUSION  07/24/2011   C5 -- C7   CESAREAN SECTION  05/26/1983   COLONOSCOPY WITH PROPOFOL N/A 09/29/2019   Procedure: COLONOSCOPY WITH PROPOFOL;  Surgeon: Ronnette Juniper, MD;  Location: [facility];  Service: Gastroenterology;  Laterality: N/A;   DISTAL INTERPHALANGEAL JOINT FUSION Right 03/14/2015   Procedure: RIGHT LONG FINGER DISTAL INTERPHALANGEAL JOINT ARTHRODESIS;  Surgeon: Iran Planas, MD;  Location: [place];  Service: Orthopedics;  Laterality: Right;   ESOPHAGOGASTRODUODENOSCOPY (EGD) WITH PROPOFOL N/A 09/29/2019   Procedure: ESOPHAGOGASTRODUODENOSCOPY (EGD) WITH PROPOFOL;  Surgeon: Ronnette Juniper, MD;  Location: [facility];  Service: Gastroenterology;  Laterality: N/A;   EYE SURGERY  05/25/1993   rk (laser surgery), semi cornea transplant, detacted retina,  fluid removal   HYSTEROSCOPY WITH D & C N/A 12/09/2020   Procedure: DILATATION AND CURETTAGE /HYSTEROSCOPY;  Surgeon: Drema Dallas, DO;  Location: [place];  Service: Gynecology;  Laterality: N/A;   KNEE ARTHROSCOPY Left 03/03/2004   POLYPECTOMY  09/29/2019   Procedure: POLYPECTOMY;  Surgeon: Ronnette Juniper, MD;  Location: [facility];  Service: Gastroenterology;;   POSTERIOR VITRECTOMY RIGHT EYE AND LASER   10/27/1999   SHOULDER SURGERY Right 05/26/1995  SKIN BIOPSY  05/2022   lower stomach   SPINAL FIXATION SURGERY W/ IMPLANT  2013 rod #1//  2014  rod 2   S1 -- T10  (rod #1)//   S1 -- T4 (rod #2)   THORACIC FUSION  03/13/2013   REMOVAL HARDWARE/  BONE GRAFT FUSION T10//  REVISION OF RODS   TOTAL KNEE ARTHROPLASTY Left 12/14/2005   UVULOPALATOPHARYNGOPLASTY  04/26/2006   w/  TONSILLECTOMY/  TURBINATE REDUCTIONS/  BILATERAL ANTERIOR ETHMOIDECTOMY   Patient Active Problem List   Diagnosis Date Noted   Thickened endometrium 07/30/2022   History of adenomatous polyp of colon 07/30/2022   Bacterial conjunctivitis of right eye 04/30/2022   Contact  dermatitis 04/30/2022   Lesion of liver 06/20/2021   C. difficile colitis 09/29/2019   Colitis 09/28/2019   Anxiety and depression 09/28/2019   DDD (degenerative disc disease), cervical s/p fusion 11/12/2016   H/O total knee replacement, left 05/06/2016   DDD lumbar spine status post fusion 05/06/2016   Psoriasis 05/05/2016   High risk medication use 05/05/2016   Cervical post-laminectomy syndrome 12/09/2015   Thoracic postlaminectomy syndrome 12/09/2015   Psoriatic arthritis ([place]) 08/23/2012   Osteopenia 08/23/2012   HLD (hyperlipidemia) 08/23/2012   RLS (restless legs syndrome) 08/23/2012   Insomnia 08/23/2012   Fibromyalgia syndrome 08/12/2012   Low back pain 08/12/2012   Syncope    PVC (premature ventricular contraction)    OCD (obsessive compulsive disorder)    GERD (gastroesophageal reflux disease)    Hypertension     REFERRING DIAG: M54.12 (ICD-10-CM) - Radiculopathy, cervical region  THERAPY DIAG:  Cervicalgia  Muscle weakness (generalized)  Abnormal posture  Pain in thoracic spine  Rationale for Evaluation and Treatment Rehabilitation  PERTINENT HISTORY:  HTN, PVCs, GERD, hx Cdiff, osteopenia, hx lumbar and cervical fusions (about 10 years ago), fibromyalgia, anxiety/depression, hx of skin cancer  PRECAUTIONS: multiple fusions, remote thoracic compression fractures, osteopenia  SUBJECTIVE:                                                                                                                                                                                      SUBJECTIVE STATEMENT:   Pt states she has been doing quite well since last session, feels manual was helpful and she has been doing self releases with good relief.   PAIN:  Are you having pain: 6/10  Location/description: aching in neck Best-worst over past week: 0-7/10 (0-9/10 on eval) Per eval -  - aggravating factors: walking, laundry, kitchen/dishes, upper body - Easing factors: resting       OBJECTIVE: (objective measures completed at initial evaluation unless otherwise dated)   DIAGNOSTIC FINDINGS:  Thoracic  MRI Aug 2023 IMPRESSION: No acute abnormality or change compared to the prior exams.   Remote mild superior endplate compression fracture of T4 and severe compression fracture T10. The patient is status post decompression at T10 and thoracolumbar fusion without evidence of complication.   PATIENT SURVEYS:  FOTO 38 current, 62 predicted   COGNITION: Overall cognitive status: Within functional limits for tasks assessed   SENSATION: No reported neuro/sensory deficits, red flag questioning reassuring    POSTURE: forward head, kyphosis   PALPATION: Palpable tightness periscapular musculature BIL             CERVICAL ROM:    Active ROM A/PROM (deg) eval AROM 07/29/22  Flexion 75% pull   Extension 50% R sided pain    Right lateral flexion     Left lateral flexion     Right rotation 35 deg 40  Left rotation 50 deg    (Blank rows = not tested) (Key: WFL = within functional limits not formally assessed, * = concordant pain, s = stiffness/stretching sensation, NT = not tested)    UPPER EXTREMITY ROM:   A/PROM Right eval Left eval  Shoulder flexion 132 deg 129 deg  Shoulder abduction      Shoulder internal rotation      Shoulder external rotation      Elbow flexion      Elbow extension      Wrist flexion      Wrist extension       (Blank rows = not tested) (Key: WFL = within functional limits not formally assessed, * = concordant pain, s = stiffness/stretching sensation, NT = not tested)  Comments:     UPPER EXTREMITY MMT:   MMT Right eval Left eval  Shoulder flexion 4 4  Shoulder extension      Shoulder abduction 4 4  Shoulder extension      Shoulder internal rotation      Shoulder external rotation      Elbow flexion      Elbow extension      Grip strength      (Blank rows = not tested)  (Key: WFL = within functional limits not  formally assessed, * = concordant pain, s = stiffness/stretching sensation, NT = not tested)  Comments:    CERVICAL SPECIAL TESTS:  Deferred given presence of hardware   FUNCTIONAL TESTS:  Overhead reach limited as above   TODAY'S TREATMENT:                                                                                               [facility] Adult PT Treatment:                                                DATE: 08/06/22 Therapeutic Exercise: Seated chin tuck 2x10 cues for reduced compensations  Seated cervical sidebending isometrics 2x5 B cues for setup, appropriate force output  Scapular retraction x10  RTB rows, standing, 2x10 cues for form, posture, and  mechanics  Dowel chest press + OH arc 2x12 cues for pacing  HEP update + education Manual Therapy: Supine; Suboccipital release + STM to pt tolerance, STM paracervicals and UT, LS                                 [facility] Adult PT Treatment:                                                DATE: 08/04/22 Therapeutic Exercise: Supine Chin tuck 1 x 10  Cervical rotation/ sidebending isometrics 1 x 10 ea holding 5 sec ea.  Levator scapulae stretch 2 x 30 sec Upper trap stretch 2 x 30 sec Scapular retraction 1 x 10 hold  Updated HEP for cervical isometrics, seated/ supine chin tuck. Manual Therapy: Sub-occipital release - taught how it can be done with tennis balls  Tack and stretch of cervical paraspinals/ sub-occipitals MTPR along the cervical parapsinals/ bil upper traps How to perform self trigger point release with theracane    [facility] Adult PT Treatment:                                                DATE: 07/29/22 Therapeutic Exercise: Nustep L3 UE/LE x 5 minutes  Scapular retraction x10 cues for HEP, monitoring symptoms LS stretch to R x30sec cues for HEP, monitoring symptoms Hooklying  shoulder press 5 sec 10 x 2  Hooklying head press 5 sec 10 x 2  Supine chest press with dowel x 20 Supine pullover with dowel  x 10 Supine red  band horizontal abduction x 15  Ball under head/neck- cervical AROM rotation x 10 alternating, gentle head nods x 10 Seated in standard chair- discussed options for head press and shoulder press while in her recliner    PATIENT EDUCATION:  Education details: rationale for interventions, HEP update  Person educated: Patient Education method: Explanation, Demonstration, Tactile cues, Verbal cues, and Handouts Education comprehension: verbalized understanding, returned demonstration, verbal cues required, tactile cues required, and needs further education     HOME EXERCISE PROGRAM: Access Code: [email or web address] URL: [email or web address] Date: 08/06/2022 Prepared by: Enis Slipper  Exercises - Gentle Levator Scapulae Stretch  - 1 x daily - 7 x weekly - 2 sets - 3 reps - 30sec  hold - Supine Shoulder Horizontal Abduction with Resistance  - 1 x daily - 7 x weekly - 1-2 sets - 10 reps - Seated Isometric Cervical Sidebending  - 1 x daily - 7 x weekly - 2 sets - 10 reps - 5 sec hold - Seated Cervical Retraction  - 1 x daily - 7 x weekly - 2 sets - 10 reps - 5 sec hold   ASSESSMENT:   CLINICAL IMPRESSION:  Pt arrives to session w/ 6/10 pain, reporting excellent response to manual techniques last session. Continues to have about the same amount of weakness but improved relief at home with self release techniques. Today focusing on progression to more seated/standing exercise to improve postural endurance which pt tolerates well. She does endorse some difficulty adhering to full HEP due to volume, subsequently updated as above. Pt reports good relief from manual at end  of session, tolerates today well with no adverse events, 5/10 pain on departure and reduced stiffness. Pt departs today's session in no acute distress, all voiced questions/concerns addressed appropriately from PT perspective.    EVAL: On exam pt does demo postural deficits, reduced cervical/GH mobility. Tolerates HEP well  without issue, cues/education provided for appropriate performance. Did advise on avoiding overexertion with HEP and monitoring symptoms as PT goes on given pt report of symptom behavior, pt verbalizes agreement/understanding with plan as outlined below. No adverse events or increases in pain reported today. Recommend skilled PT to address aforementioned deficits and maximize functional tolerance. Pt departs today's session in no acute distress, all voiced questions/concerns addressed appropriately from PT perspective.     OBJECTIVE IMPAIRMENTS: decreased activity tolerance, decreased endurance, decreased mobility, decreased ROM, decreased strength, hypomobility, impaired perceived functional ability, impaired UE functional use, improper body mechanics, postural dysfunction, and pain.    ACTIVITY LIMITATIONS: carrying, lifting, bending, standing, dressing, reach over head, hygiene/grooming, and locomotion level   PARTICIPATION LIMITATIONS: meal prep, cleaning, laundry, and driving   PERSONAL FACTORS: Age, Time since onset of injury/illness/exacerbation, and 3+ comorbidities: HTN, osteopenia, hx lumbar and cervical fusions, fibromyalgia, anxiety/depression  are also affecting patient's functional outcome.    REHAB POTENTIAL: Fair given chronicity and comorbidities   CLINICAL DECISION MAKING: Evolving/moderate complexity   EVALUATION COMPLEXITY: Moderate     GOALS: Goals reviewed with patient? No   SHORT TERM GOALS: Target date: 08/18/2022 Pt will demonstrate appropriate understanding and performance of initially prescribed HEP in order to facilitate improved independence with management of symptoms.  Baseline: HEP provided on eval Goal status: INITIAL    2. Pt will score greater than or equal to 44 on FOTO in order to demonstrate improved perception of function due to symptoms.            Baseline: 38            Goal status: INITIAL     LONG TERM GOALS: Target date: 09/15/2022   Pt will  score 49 on FOTO in order to demonstrate improved perception of function due to symptoms. Baseline: 38 Goal status: INITIAL   2. Pt will demonstrate at least 45 degrees of active cervical rotation ROM BIL in order to demonstrate improved environmental awareness and safety with driving.  Baseline: see ROM chart above Goal status: INITIAL   3. Pt will demonstrate at least 4+/5 shoulder MMT for improved symmetry of UE strength and improved tolerance to functional movements.  Baseline: see MMT chart above Goal status: INITIAL    4. Pt will report/demonstrate ability to stand/walk for up to 8mn with less than 2 point increase on NPS in order to demonstrate improved tolerance to household activities. Baseline: up to 9/10 pain with prolonged activities Goal status: INITIAL                         5. Pt will report/demonstrate independence with final prescribed HEP in order to facilitate improved self efficacy with management of symptoms.             Baseline: TBD            Goal status: INITIAL                                     PLAN:   PT FREQUENCY: 2x/week   PT DURATION: 8 weeks  PLANNED INTERVENTIONS: Therapeutic exercises, Therapeutic activity, Neuromuscular re-education, Balance training, Gait training, Patient/Family education, Self Care, Vestibular training, Dry Needling, Cryotherapy, Moist heat, Taping, Manual therapy, and Re-evaluation   PLAN FOR NEXT SESSION: gentle postural stability/strengthening, cervical mobility as appropriate. Consider STM to reduce tightness PRN.        Leeroy Cha PT, DPT 08/06/2022 4:51 PM

## 2022-08-06 ENCOUNTER — Ambulatory Visit: Payer: Medicare Other | Admitting: Physical Therapy

## 2022-08-06 ENCOUNTER — Encounter: Payer: Self-pay | Admitting: Physical Therapy

## 2022-08-06 DIAGNOSIS — M542 Cervicalgia: Secondary | ICD-10-CM

## 2022-08-06 DIAGNOSIS — M546 Pain in thoracic spine: Secondary | ICD-10-CM

## 2022-08-06 DIAGNOSIS — M6281 Muscle weakness (generalized): Secondary | ICD-10-CM

## 2022-08-06 DIAGNOSIS — R293 Abnormal posture: Secondary | ICD-10-CM

## 2022-08-10 ENCOUNTER — Other Ambulatory Visit: Payer: Self-pay | Admitting: Family Medicine

## 2022-08-10 ENCOUNTER — Telehealth: Payer: Self-pay | Admitting: Family Medicine

## 2022-08-10 ENCOUNTER — Ambulatory Visit: Payer: Medicare Other | Admitting: Physical Therapy

## 2022-08-10 ENCOUNTER — Encounter: Payer: Self-pay | Admitting: Physical Therapy

## 2022-08-10 DIAGNOSIS — R293 Abnormal posture: Secondary | ICD-10-CM

## 2022-08-10 DIAGNOSIS — M6281 Muscle weakness (generalized): Secondary | ICD-10-CM

## 2022-08-10 DIAGNOSIS — M542 Cervicalgia: Secondary | ICD-10-CM

## 2022-08-10 DIAGNOSIS — M546 Pain in thoracic spine: Secondary | ICD-10-CM

## 2022-08-10 LAB — ZINC: Zinc: 41 ug/dL — ABNORMAL LOW (ref 60–130)

## 2022-08-10 MED ORDER — OXYCODONE-ACETAMINOPHEN 5-325 MG PO TABS: 1.0000 | ORAL_TABLET | Freq: Three times a day (TID) | ORAL | 0 refills | Status: DC | PRN

## 2022-08-10 NOTE — Therapy (Signed)
OUTPATIENT PHYSICAL THERAPY TREATMENT NOTE   Patient Name: Janet Peterson MRN: [id number] DOB:July 16, 1950, 72 y.o., female Today's Date: 08/10/2022  PCP: Susy Frizzle, MD   REFERRING PROVIDER: Loanne Drilling, PA-C    END OF SESSION:   PT End of Session - 08/10/22 1457     Visit Number 5    Number of Visits 17    Date for PT Re-Evaluation 09/15/22    Authorization Type [employer] MCR    Progress Note Due on Visit 10    PT Start Time 1500    PT Stop Time 1544    PT Time Calculation (min) 44 min    Activity Tolerance Patient tolerated treatment well;No increased pain    Behavior During Therapy WFL for tasks assessed/performed                Past Medical History:  Diagnosis Date   Ankylosing spondylitis ([place])    Anxiety and depression    Depression    Fibromyalgia    GERD (gastroesophageal reflux disease)    Hiatal hernia    per patient, dx by GI    History of basal cell carcinoma excision    FACE, 1992  &  1996   History of hiatal hernia    History of thrush    Hyperlipidemia    Hypertension    Insomnia    Left breast mass    OCD (obsessive compulsive disorder)    Osteopenia    PONV (postoperative nausea and vomiting)    Psoriatic arthritis ([facility])    PVC (premature ventricular contraction)    Rheumatoid arthritis ([place])    Past Surgical History:  Procedure Laterality Date   BIOPSY  09/29/2019   Procedure: BIOPSY;  Surgeon: Ronnette Juniper, MD;  Location: [facility];  Service: Gastroenterology;;   BREAST EXCISIONAL BIOPSY Right    BREAST EXCISIONAL BIOPSY Left 05/04/2018   BREAST LUMPECTOMY WITH RADIOACTIVE SEED LOCALIZATION Left 05/04/2018   Procedure: LEFT BREAST LUMPECTOMY WITH RADIOACTIVE SEED LOCALIZATION AND LEFT BREAST NIPPLE BIOPSY;  Surgeon: Jovita Kussmaul, MD;  Location: [facility];  Service: General;  Laterality: Left;   BREAST SURGERY  05/25/1973   lumpectomy-- benign   BUNIONECTOMY  05/25/1989   CATARACT EXTRACTION W/ INTRAOCULAR LENS   IMPLANT, BILATERAL  05/25/1998   CERVICAL FUSION  07/24/2011   C5 -- C7   CESAREAN SECTION  05/26/1983   COLONOSCOPY WITH PROPOFOL N/A 09/29/2019   Procedure: COLONOSCOPY WITH PROPOFOL;  Surgeon: Ronnette Juniper, MD;  Location: [facility];  Service: Gastroenterology;  Laterality: N/A;   DISTAL INTERPHALANGEAL JOINT FUSION Right 03/14/2015   Procedure: RIGHT LONG FINGER DISTAL INTERPHALANGEAL JOINT ARTHRODESIS;  Surgeon: Iran Planas, MD;  Location: [facility];  Service: Orthopedics;  Laterality: Right;   ESOPHAGOGASTRODUODENOSCOPY (EGD) WITH PROPOFOL N/A 09/29/2019   Procedure: ESOPHAGOGASTRODUODENOSCOPY (EGD) WITH PROPOFOL;  Surgeon: Ronnette Juniper, MD;  Location: [facility];  Service: Gastroenterology;  Laterality: N/A;   EYE SURGERY  05/25/1993   rk (laser surgery), semi cornea transplant, detacted retina,  fluid removal   HYSTEROSCOPY WITH D & C N/A 12/09/2020   Procedure: DILATATION AND CURETTAGE /HYSTEROSCOPY;  Surgeon: Drema Dallas, DO;  Location: [facility];  Service: Gynecology;  Laterality: N/A;   KNEE ARTHROSCOPY Left 03/03/2004   POLYPECTOMY  09/29/2019   Procedure: POLYPECTOMY;  Surgeon: Ronnette Juniper, MD;  Location: [facility];  Service: Gastroenterology;;   POSTERIOR VITRECTOMY RIGHT EYE AND LASER   10/27/1999   SHOULDER SURGERY Right 05/26/1995  SKIN BIOPSY  05/2022   lower stomach   SPINAL FIXATION SURGERY W/ IMPLANT  2013 rod #1//  2014  rod 2   S1 -- T10  (rod #1)//   S1 -- T4 (rod #2)   THORACIC FUSION  03/13/2013   REMOVAL HARDWARE/  BONE GRAFT FUSION T10//  REVISION OF RODS   TOTAL KNEE ARTHROPLASTY Left 12/14/2005   UVULOPALATOPHARYNGOPLASTY  04/26/2006   w/  TONSILLECTOMY/  TURBINATE REDUCTIONS/  BILATERAL ANTERIOR ETHMOIDECTOMY   Patient Active Problem List   Diagnosis Date Noted   Thickened endometrium 07/30/2022   History of adenomatous polyp of colon 07/30/2022   Bacterial conjunctivitis of right eye 04/30/2022   Contact  dermatitis 04/30/2022   Lesion of liver 06/20/2021   C. difficile colitis 09/29/2019   Colitis 09/28/2019   Anxiety and depression 09/28/2019   DDD (degenerative disc disease), cervical s/p fusion 11/12/2016   H/O total knee replacement, left 05/06/2016   DDD lumbar spine status post fusion 05/06/2016   Psoriasis 05/05/2016   High risk medication use 05/05/2016   Cervical post-laminectomy syndrome 12/09/2015   Thoracic postlaminectomy syndrome 12/09/2015   Psoriatic arthritis ([place]) 08/23/2012   Osteopenia 08/23/2012   HLD (hyperlipidemia) 08/23/2012   RLS (restless legs syndrome) 08/23/2012   Insomnia 08/23/2012   Fibromyalgia syndrome 08/12/2012   Low back pain 08/12/2012   Syncope    PVC (premature ventricular contraction)    OCD (obsessive compulsive disorder)    GERD (gastroesophageal reflux disease)    Hypertension     REFERRING DIAG: M54.12 (ICD-10-CM) - Radiculopathy, cervical region  THERAPY DIAG:  Cervicalgia  Muscle weakness (generalized)  Abnormal posture  Pain in thoracic spine  Rationale for Evaluation and Treatment Rehabilitation  PERTINENT HISTORY:  HTN, PVCs, GERD, hx Cdiff, osteopenia, hx lumbar and cervical fusions (about 10 years ago), fibromyalgia, anxiety/depression, hx of skin cancer  PRECAUTIONS: multiple fusions, remote thoracic compression fractures, osteopenia  SUBJECTIVE:                                                                                                                                                                                      SUBJECTIVE STATEMENT:   Pt states she has been doing well since last session, states the isometrics seemed like they gave her a bit more headaches so she backed off and back to baseline. Otherwise doing quite well with HEP, felt good after last session.   PAIN:  Are you having pain: 6/10  Location/description: aching in neck Best-worst over past week: 0-7/10 (0-9/10 on eval) Per eval -  -  aggravating factors: walking, laundry, kitchen/dishes, upper body - Easing factors: resting  OBJECTIVE: (objective measures completed at initial evaluation unless otherwise dated)   DIAGNOSTIC FINDINGS:  Thoracic MRI Aug 2023 IMPRESSION: No acute abnormality or change compared to the prior exams.   Remote mild superior endplate compression fracture of T4 and severe compression fracture T10. The patient is status post decompression at T10 and thoracolumbar fusion without evidence of complication.   PATIENT SURVEYS:  FOTO 38 current, 51 predicted   COGNITION: Overall cognitive status: Within functional limits for tasks assessed   SENSATION: No reported neuro/sensory deficits, red flag questioning reassuring    POSTURE: forward head, kyphosis   PALPATION: Palpable tightness periscapular musculature BIL             CERVICAL ROM:    Active ROM A/PROM (deg) eval AROM 07/29/22  Flexion 75% pull   Extension 50% R sided pain    Right lateral flexion     Left lateral flexion     Right rotation 35 deg 40  Left rotation 50 deg    (Blank rows = not tested) (Key: WFL = within functional limits not formally assessed, * = concordant pain, s = stiffness/stretching sensation, NT = not tested)    UPPER EXTREMITY ROM:   A/PROM Right eval Left eval  Shoulder flexion 132 deg 129 deg  Shoulder abduction      Shoulder internal rotation      Shoulder external rotation      Elbow flexion      Elbow extension      Wrist flexion      Wrist extension       (Blank rows = not tested) (Key: WFL = within functional limits not formally assessed, * = concordant pain, s = stiffness/stretching sensation, NT = not tested)  Comments:     UPPER EXTREMITY MMT:   MMT Right eval Left eval  Shoulder flexion 4 4  Shoulder extension      Shoulder abduction 4 4  Shoulder extension      Shoulder internal rotation      Shoulder external rotation      Elbow flexion      Elbow extension       Grip strength      (Blank rows = not tested)  (Key: WFL = within functional limits not formally assessed, * = concordant pain, s = stiffness/stretching sensation, NT = not tested)  Comments:    CERVICAL SPECIAL TESTS:  Deferred given presence of hardware   FUNCTIONAL TESTS:  Overhead reach limited as above   TODAY'S TREATMENT:                                                                                               [facility] Adult PT Treatment:                                                DATE: 08/10/22 Therapeutic Exercise: Dowel chest press + OH arc 2# 2x10 cues for form and pacing  Seated chin tuck 2x12 (in  chair for back support) cues for reduced compensations RTB 3x10 rows cues form and reduced UT compensation LS stretch 3x30sec  UT stretch 4x30 sec HEP discussion, hold on sidebending isos for now  Manual Therapy: Supine; gentle cervical distraction, suboccipital release, STM paracervical musculature, LS, UT BIL to pt tolerance    [facility] Adult PT Treatment:                                                DATE: 08/06/22 Therapeutic Exercise: Seated chin tuck 2x10 cues for reduced compensations  Seated cervical sidebending isometrics 2x5 B cues for setup, appropriate force output  Scapular retraction x10  RTB rows, standing, 2x10 cues for form, posture, and mechanics  Dowel chest press + OH arc 2x12 cues for pacing  HEP update + education Manual Therapy: Supine; Suboccipital release + STM to pt tolerance, STM paracervicals and UT, LS                                 [facility] Adult PT Treatment:                                                DATE: 08/04/22 Therapeutic Exercise: Supine Chin tuck 1 x 10  Cervical rotation/ sidebending isometrics 1 x 10 ea holding 5 sec ea.  Levator scapulae stretch 2 x 30 sec Upper trap stretch 2 x 30 sec Scapular retraction 1 x 10 hold  Updated HEP for cervical isometrics, seated/ supine chin tuck. Manual Therapy: Sub-occipital release - taught  how it can be done with tennis balls  Tack and stretch of cervical paraspinals/ sub-occipitals MTPR along the cervical parapsinals/ bil upper traps How to perform self trigger point release with theracane    PATIENT EDUCATION:  Education details: rationale for interventions, HEP update  Person educated: Patient Education method: Explanation, Demonstration, Tactile cues, Verbal cues, and Handouts Education comprehension: verbalized understanding, returned demonstration, verbal cues required, tactile cues required, and needs further education     HOME EXERCISE PROGRAM: Access Code: [email or web address] URL: [email or web address] Date: 08/06/2022 Prepared by: Enis Slipper  Exercises - Gentle Levator Scapulae Stretch  - 1 x daily - 7 x weekly - 2 sets - 3 reps - 30sec  hold - Supine Shoulder Horizontal Abduction with Resistance  - 1 x daily - 7 x weekly - 1-2 sets - 10 reps - Seated Isometric Cervical Sidebending  - 1 x daily - 7 x weekly - 2 sets - 10 reps - 5 sec hold (* verbal hold for now 08/10/22)  - Seated Cervical Retraction  - 1 x daily - 7 x weekly - 2 sets - 10 reps - 5 sec hold   ASSESSMENT:   CLINICAL IMPRESSION:  Pt arrives to session w/ 6/10 pain, continues to report good adherence to HEP overall (see subjective re: isos), good relief from self release at home. No issues after last session. Today able to progress for repetition/resistance with familiar exercises and increased time spent in upright positioning to improve postural endurance. Cues as above, no adverse events and pt reports gradual improvement in pain as session goes on. Pt continues to endorse good relief from manual  at end of session, departs w/ 4/10 pain. Pt departs today's session in no acute distress, all voiced questions/concerns addressed appropriately from PT perspective.    EVAL: On exam pt does demo postural deficits, reduced cervical/GH mobility. Tolerates HEP well without issue, cues/education  provided for appropriate performance. Did advise on avoiding overexertion with HEP and monitoring symptoms as PT goes on given pt report of symptom behavior, pt verbalizes agreement/understanding with plan as outlined below. No adverse events or increases in pain reported today. Recommend skilled PT to address aforementioned deficits and maximize functional tolerance. Pt departs today's session in no acute distress, all voiced questions/concerns addressed appropriately from PT perspective.     OBJECTIVE IMPAIRMENTS: decreased activity tolerance, decreased endurance, decreased mobility, decreased ROM, decreased strength, hypomobility, impaired perceived functional ability, impaired UE functional use, improper body mechanics, postural dysfunction, and pain.    ACTIVITY LIMITATIONS: carrying, lifting, bending, standing, dressing, reach over head, hygiene/grooming, and locomotion level   PARTICIPATION LIMITATIONS: meal prep, cleaning, laundry, and driving   PERSONAL FACTORS: Age, Time since onset of injury/illness/exacerbation, and 3+ comorbidities: HTN, osteopenia, hx lumbar and cervical fusions, fibromyalgia, anxiety/depression  are also affecting patient's functional outcome.    REHAB POTENTIAL: Fair given chronicity and comorbidities   CLINICAL DECISION MAKING: Evolving/moderate complexity   EVALUATION COMPLEXITY: Moderate     GOALS: Goals reviewed with patient? No   SHORT TERM GOALS: Target date: 08/18/2022 Pt will demonstrate appropriate understanding and performance of initially prescribed HEP in order to facilitate improved independence with management of symptoms.  Baseline: HEP provided on eval Goal status: INITIAL    2. Pt will score greater than or equal to 44 on FOTO in order to demonstrate improved perception of function due to symptoms.            Baseline: 38            Goal status: INITIAL     LONG TERM GOALS: Target date: 09/15/2022   Pt will score 49 on FOTO in order to  demonstrate improved perception of function due to symptoms. Baseline: 38 Goal status: INITIAL   2. Pt will demonstrate at least 45 degrees of active cervical rotation ROM BIL in order to demonstrate improved environmental awareness and safety with driving.  Baseline: see ROM chart above Goal status: INITIAL   3. Pt will demonstrate at least 4+/5 shoulder MMT for improved symmetry of UE strength and improved tolerance to functional movements.  Baseline: see MMT chart above Goal status: INITIAL    4. Pt will report/demonstrate ability to stand/walk for up to 19min with less than 2 point increase on NPS in order to demonstrate improved tolerance to household activities. Baseline: up to 9/10 pain with prolonged activities Goal status: INITIAL                         5. Pt will report/demonstrate independence with final prescribed HEP in order to facilitate improved self efficacy with management of symptoms.             Baseline: TBD            Goal status: INITIAL                                     PLAN:   PT FREQUENCY: 2x/week   PT DURATION: 8 weeks   PLANNED INTERVENTIONS: Therapeutic exercises, Therapeutic activity, Neuromuscular re-education,  Balance training, Gait training, Patient/Family education, Self Care, Vestibular training, Dry Needling, Cryotherapy, Moist heat, Taping, Manual therapy, and Re-evaluation   PLAN FOR NEXT SESSION: gentle postural stability/strengthening, cervical mobility as appropriate. Consider STM to reduce tightness PRN.         Leeroy Cha PT, DPT 08/10/2022 3:48 PM

## 2022-08-10 NOTE — Telephone Encounter (Signed)
Prescription Request  08/10/2022  LOV: 07/30/2022  What is the name of the medication or equipment?   oxyCODONE-acetaminophen (PERCOCET) 5-325 MG tablet   Have you contacted your pharmacy to request a refill? Yes   Which pharmacy would you like this sent to?   [id number] [place], [place] [address] [place] [address] Phone: [phone]  Fax: [phone] DEA #: [id number]   Patient notified that their request is being sent to the clinical staff for review and that they should receive a response within 2 business days.   Please advise patient when script sent in at [phone].

## 2022-08-13 ENCOUNTER — Ambulatory Visit (INDEPENDENT_AMBULATORY_CARE_PROVIDER_SITE_OTHER): Payer: Medicare Other

## 2022-08-13 VITALS — Ht 63.0 in | Wt 198.0 lb

## 2022-08-13 DIAGNOSIS — Z0000 Encounter for general adult medical examination without abnormal findings: Secondary | ICD-10-CM | POA: Diagnosis not present

## 2022-08-13 NOTE — Patient Instructions (Signed)
Janet Peterson , Thank you for taking time to come for your Medicare Wellness Visit. I appreciate your ongoing commitment to your health goals. Please review the following plan we discussed and let me know if I can assist you in the future.   These are the goals we discussed:  Goals      Exercise 3x per week (30 min per time)     08/07/21-Encouraged pt to try chair exercises.      Remain active and independent        This is a list of the screening recommended for you and due dates:  Health Maintenance  Topic Date Due   Pneumonia Vaccine (3 of 3 - PPSV23 or PCV20) 08/13/2022*   Hepatitis C Screening: USPSTF Recommendation to screen - Ages 18-79 yo.  08/13/2022*   Medicare Annual Wellness Visit  08/13/2023   Mammogram  06/12/2024   Colon Cancer Screening  09/28/2024   Flu Shot  Completed   DEXA scan (bone density measurement)  Completed   Zoster (Shingles) Vaccine  Completed   HPV Vaccine  Aged Out   DTaP/Tdap/Td vaccine  Discontinued   COVID-19 Vaccine  Discontinued  *Topic was postponed. The date shown is not the original due date.    Advanced directives: Please bring a copy of your health care power of attorney and living will to the office to be added to your chart at your convenience.   Conditions/risks identified: Aim for 30 minutes of exercise or brisk walking, 6-8 glasses of water, and 5 servings of fruits and vegetables each day.   Next appointment: Follow up in one year for your annual wellness visit    Preventive Care 65 Years and Older, Female Preventive care refers to lifestyle choices and visits with your health care provider that can promote health and wellness. What does preventive care include? A yearly physical exam. This is also called an annual well check. Dental exams once or twice a year. Routine eye exams. Ask your health care provider how often you should have your eyes checked. Personal lifestyle choices, including: Daily care of your teeth and  gums. Regular physical activity. Eating a healthy diet. Avoiding tobacco and drug use. Limiting alcohol use. Practicing safe sex. Taking low-dose aspirin every day. Taking vitamin and mineral supplements as recommended by your health care provider. What happens during an annual well check? The services and screenings done by your health care provider during your annual well check will depend on your age, overall health, lifestyle risk factors, and family history of disease. Counseling  Your health care provider may ask you questions about your: Alcohol use. Tobacco use. Drug use. Emotional well-being. Home and relationship well-being. Sexual activity. Eating habits. History of falls. Memory and ability to understand (cognition). Work and work Statistician. Reproductive health. Screening  You may have the following tests or measurements: Height, weight, and BMI. Blood pressure. Lipid and cholesterol levels. These may be checked every 5 years, or more frequently if you are over 41 years old. Skin check. Lung cancer screening. You may have this screening every year starting at age 49 if you have a 30-pack-year history of smoking and currently smoke or have quit within the past 15 years. Fecal occult blood test (FOBT) of the stool. You may have this test every year starting at age 29. Flexible sigmoidoscopy or colonoscopy. You may have a sigmoidoscopy every 5 years or a colonoscopy every 10 years starting at age 48. Hepatitis C blood test. Hepatitis B blood  test. Sexually transmitted disease (STD) testing. Diabetes screening. This is done by checking your blood sugar (glucose) after you have not eaten for a while (fasting). You may have this done every 1-3 years. Bone density scan. This is done to screen for osteoporosis. You may have this done starting at age 73. Mammogram. This may be done every 1-2 years. Talk to your health care provider about how often you should have regular  mammograms. Talk with your health care provider about your test results, treatment options, and if necessary, the need for more tests. Vaccines  Your health care provider may recommend certain vaccines, such as: Influenza vaccine. This is recommended every year. Tetanus, diphtheria, and acellular pertussis (Tdap, Td) vaccine. You may need a Td booster every 10 years. Zoster vaccine. You may need this after age 59. Pneumococcal 13-valent conjugate (PCV13) vaccine. One dose is recommended after age 87. Pneumococcal polysaccharide (PPSV23) vaccine. One dose is recommended after age 48. Talk to your health care provider about which screenings and vaccines you need and how often you need them. This information is not intended to replace advice given to you by your health care provider. Make sure you discuss any questions you have with your health care provider. Document Released: 06/07/2015 Document Revised: 01/29/2016 Document Reviewed: 03/12/2015 Elsevier Interactive Patient Education  2017 [place] Prevention in the Home Falls can cause injuries. They can happen to people of all ages. There are many things you can do to make your home safe and to help prevent falls. What can I do on the outside of my home? Regularly fix the edges of walkways and driveways and fix any cracks. Remove anything that might make you trip as you walk through a door, such as a raised step or threshold. Trim any bushes or trees on the path to your home. Use bright outdoor lighting. Clear any walking paths of anything that might make someone trip, such as rocks or tools. Regularly check to see if handrails are loose or broken. Make sure that both sides of any steps have handrails. Any raised decks and porches should have guardrails on the edges. Have any leaves, snow, or ice cleared regularly. Use sand or salt on walking paths during winter. Clean up any spills in your garage right away. This includes oil  or grease spills. What can I do in the bathroom? Use night lights. Install grab bars by the toilet and in the tub and shower. Do not use towel bars as grab bars. Use non-skid mats or decals in the tub or shower. If you need to sit down in the shower, use a plastic, non-slip stool. Keep the floor dry. Clean up any water that spills on the floor as soon as it happens. Remove soap buildup in the tub or shower regularly. Attach bath mats securely with double-sided non-slip rug tape. Do not have throw rugs and other things on the floor that can make you trip. What can I do in the bedroom? Use night lights. Make sure that you have a light by your bed that is easy to reach. Do not use any sheets or blankets that are too big for your bed. They should not hang down onto the floor. Have a firm chair that has side arms. You can use this for support while you get dressed. Do not have throw rugs and other things on the floor that can make you trip. What can I do in the kitchen? Clean up any spills right  away. Avoid walking on wet floors. Keep items that you use a lot in easy-to-reach places. If you need to reach something above you, use a strong step stool that has a grab bar. Keep electrical cords out of the way. Do not use floor polish or wax that makes floors slippery. If you must use wax, use non-skid floor wax. Do not have throw rugs and other things on the floor that can make you trip. What can I do with my stairs? Do not leave any items on the stairs. Make sure that there are handrails on both sides of the stairs and use them. Fix handrails that are broken or loose. Make sure that handrails are as long as the stairways. Check any carpeting to make sure that it is firmly attached to the stairs. Fix any carpet that is loose or worn. Avoid having throw rugs at the top or bottom of the stairs. If you do have throw rugs, attach them to the floor with carpet tape. Make sure that you have a light  switch at the top of the stairs and the bottom of the stairs. If you do not have them, ask someone to add them for you. What else can I do to help prevent falls? Wear shoes that: Do not have high heels. Have rubber bottoms. Are comfortable and fit you well. Are closed at the toe. Do not wear sandals. If you use a stepladder: Make sure that it is fully opened. Do not climb a closed stepladder. Make sure that both sides of the stepladder are locked into place. Ask someone to hold it for you, if possible. Clearly mark and make sure that you can see: Any grab bars or handrails. First and last steps. Where the edge of each step is. Use tools that help you move around (mobility aids) if they are needed. These include: Canes. Walkers. Scooters. Crutches. Turn on the lights when you go into a dark area. Replace any light bulbs as soon as they burn out. Set up your furniture so you have a clear path. Avoid moving your furniture around. If any of your floors are uneven, fix them. If there are any pets around you, be aware of where they are. Review your medicines with your doctor. Some medicines can make you feel dizzy. This can increase your chance of falling. Ask your doctor what other things that you can do to help prevent falls. This information is not intended to replace advice given to you by your health care provider. Make sure you discuss any questions you have with your health care provider. Document Released: 03/07/2009 Document Revised: 10/17/2015 Document Reviewed: 06/15/2014 Elsevier Interactive Patient Education  2017 [employer].

## 2022-08-13 NOTE — Progress Notes (Signed)
Subjective:   Janet Peterson is a 72 y.o. female who presents for Medicare Annual (Subsequent) preventive examination.  I connected with  Janet Peterson on 08/13/22 by a audio enabled telemedicine application and verified that I am speaking with the correct person using two identifiers.  Patient Location: Home  Provider Location: Office/Clinic  I discussed the limitations of evaluation and management by telemedicine. The patient expressed understanding and agreed to proceed.  Review of Systems     Cardiac Risk Factors include: advanced age (>5men, >49 women);dyslipidemia;hypertension;sedentary lifestyle     Objective:    Today's Vitals   08/13/22 1408  Weight: 198 lb (89.8 kg)  Height: 5\' 3"  (1.6 m)   Body mass index is 35.07 kg/m.     08/13/2022    2:13 PM 07/21/2022    3:43 PM 09/16/2021    1:28 PM 08/07/2021    2:17 PM 07/31/2021    2:05 PM 12/09/2020   10:57 AM 12/02/2020    1:50 PM  Advanced Directives  Does Patient Have a Medical Advance Directive? Yes Yes Yes Yes Yes Yes Yes  Type of Advance Directive Living will;Healthcare Power of [place]  Does patient want to make changes to medical advance directive? No - Patient declined No - Patient declined    No - Patient declined No - Patient declined  Copy of [place] in Chart? No - copy requested   No - copy requested   No - copy requested    Current Medications (verified) Outpatient Encounter Medications as of 08/13/2022  Medication Sig   buPROPion (WELLBUTRIN XL) 300 MG 24 hr tablet Take 300 mg by mouth every morning.   ciprofloxacin (CILOXAN) 0.3 % ophthalmic solution Place 2 drops into the right eye every 2 (two) hours. Administer 1 drop, every 2 hours, while awake, for 2 days. Then 1 drop, every 4 hours, while awake, for the next 5 days. (Patient taking differently: Place 2 drops into the  right eye as needed. Administer 1 drop, every 2 hours, while awake, for 2 days. Then 1 drop, every 4 hours, while awake, for the next 5 days.)   clonazePAM (KLONOPIN) 1 MG tablet TAKE 1 TABLET BY MOUTH EVERY DAY AS NEEDED FOR ANXIETY (Patient taking differently: Take 1 mg by mouth daily as needed for anxiety.)   dexlansoprazole (DEXILANT) 60 MG capsule TAKE 1 CAPSULE BY MOUTH EVERY DAY   doxazosin (CARDURA) 2 MG tablet TAKE 1 TABLET BY MOUTH EVERY DAY   doxepin (SINEQUAN) 50 MG capsule Take 50 mg by mouth.   ezetimibe (ZETIA) 10 MG tablet TAKE 1 TABLET BY MOUTH EVERY DAY   finasteride (PROSCAR) 5 MG tablet Take 0.5 tablets (2.5 mg total) by mouth daily.   FLUoxetine (PROZAC) 20 MG capsule Take 20 mg by mouth 3 (three) times daily.   fluticasone (FLONASE) 50 MCG/ACT nasal spray Place 2 sprays into both nostrils daily as needed for allergies.   hydrochlorothiazide (HYDRODIURIL) 25 MG tablet TAKE 1 TABLET (25 MG TOTAL) BY MOUTH DAILY.   leflunomide (ARAVA) 20 MG tablet TAKE 1 TABLET BY MOUTH EVERY DAY   linaclotide (LINZESS) 72 MCG capsule Take 1 capsule (72 mcg total) by mouth daily before breakfast. (Patient taking differently: Take 72 mcg by mouth as needed.)   metoprolol succinate (TOPROL-XL) 50 MG 24 hr tablet TAKE 1 TABLET BY MOUTH ONCE DAILY FOLLOWING A MEAL  ondansetron (ZOFRAN) 4 MG tablet Take 4 mg by mouth 4 (four) times daily as needed for nausea.   oxyCODONE-acetaminophen (PERCOCET) 5-325 MG tablet Take 1 tablet by mouth every 8 (eight) hours as needed for severe pain.   pregabalin (LYRICA) 100 MG capsule TAKE 1 CAPSULE BY MOUTH TWICE A DAY   pregabalin (LYRICA) 75 MG capsule Take 1 capsule (75 mg total) by mouth 2 (two) times daily.   tiZANidine (ZANAFLEX) 4 MG capsule Take 1 capsule (4 mg total) by mouth 3 (three) times daily. (Patient taking differently: Take 4 mg by mouth as needed.)   valbenazine (INGREZZA) 80 MG capsule Take 1 capsule (80 mg total) by mouth daily.   valsartan  (DIOVAN) 320 MG tablet TAKE 1 TABLET BY MOUTH EVERY DAY   hydrocortisone cream 1 % Apply 1 Application topically 2 (two) times daily. (Patient not taking: Reported on 07/30/2022)   INGREZZA 80 MG capsule Take one capsule by mouth daily (Patient not taking: Reported on 07/30/2022)   No facility-administered encounter medications on file as of 08/13/2022.    Allergies (verified) Latex, Amlodipine besy-benazepril hcl, Amoxicillin-pot clavulanate, Bextra [valdecoxib], Chlorhexidine, Lipitor [atorvastatin calcium], Sulfa antibiotics, and Zocor [simvastatin]   History: Past Medical History:  Diagnosis Date   Ankylosing spondylitis ([place])    Anxiety and depression    Depression    Fibromyalgia    GERD (gastroesophageal reflux disease)    Hiatal hernia    per patient, dx by GI    History of basal cell carcinoma excision    FACE, 1992  &  1996   History of hiatal hernia    History of thrush    Hyperlipidemia    Hypertension    Insomnia    Left breast mass    OCD (obsessive compulsive disorder)    Osteopenia    PONV (postoperative nausea and vomiting)    Psoriatic arthritis (HCC)    PVC (premature ventricular contraction)    Rheumatoid arthritis ([place])    Past Surgical History:  Procedure Laterality Date   BIOPSY  09/29/2019   Procedure: BIOPSY;  Surgeon: Ronnette Juniper, MD;  Location: [facility];  Service: Gastroenterology;;   BREAST EXCISIONAL BIOPSY Right    BREAST EXCISIONAL BIOPSY Left 05/04/2018   BREAST LUMPECTOMY WITH RADIOACTIVE SEED LOCALIZATION Left 05/04/2018   Procedure: LEFT BREAST LUMPECTOMY WITH RADIOACTIVE SEED LOCALIZATION AND LEFT BREAST NIPPLE BIOPSY;  Surgeon: Jovita Kussmaul, MD;  Location: [place];  Service: General;  Laterality: Left;   BREAST SURGERY  05/25/1973   lumpectomy-- benign   BUNIONECTOMY  05/25/1989   CATARACT EXTRACTION W/ INTRAOCULAR LENS  IMPLANT, BILATERAL  05/25/1998   CERVICAL FUSION  07/24/2011   C5 -- C7   CESAREAN SECTION   05/26/1983   COLONOSCOPY WITH PROPOFOL N/A 09/29/2019   Procedure: COLONOSCOPY WITH PROPOFOL;  Surgeon: Ronnette Juniper, MD;  Location: [facility];  Service: Gastroenterology;  Laterality: N/A;   DISTAL INTERPHALANGEAL JOINT FUSION Right 03/14/2015   Procedure: RIGHT LONG FINGER DISTAL INTERPHALANGEAL JOINT ARTHRODESIS;  Surgeon: Iran Planas, MD;  Location: [place];  Service: Orthopedics;  Laterality: Right;   ESOPHAGOGASTRODUODENOSCOPY (EGD) WITH PROPOFOL N/A 09/29/2019   Procedure: ESOPHAGOGASTRODUODENOSCOPY (EGD) WITH PROPOFOL;  Surgeon: Ronnette Juniper, MD;  Location: [facility];  Service: Gastroenterology;  Laterality: N/A;   EYE SURGERY  05/25/1993   rk (laser surgery), semi cornea transplant, detacted retina,  fluid removal   HYSTEROSCOPY WITH D & C N/A 12/09/2020   Procedure: DILATATION AND CURETTAGE /HYSTEROSCOPY;  Surgeon: Drema Dallas, DO;  Location: [place];  Service: Gynecology;  Laterality: N/A;   KNEE ARTHROSCOPY Left 03/03/2004   POLYPECTOMY  09/29/2019   Procedure: POLYPECTOMY;  Surgeon: Ronnette Juniper, MD;  Location: [facility];  Service: Gastroenterology;;   POSTERIOR VITRECTOMY RIGHT EYE AND LASER   10/27/1999   SHOULDER SURGERY Right 05/26/1995   SKIN BIOPSY  05/2022   lower stomach   SPINAL FIXATION SURGERY W/ IMPLANT  2013 rod #1//  2014  rod 2   S1 -- T10  (rod #1)//   S1 -- T4 (rod #2)   THORACIC FUSION  03/13/2013   REMOVAL HARDWARE/  BONE GRAFT FUSION T10//  REVISION OF RODS   TOTAL KNEE ARTHROPLASTY Left 12/14/2005   UVULOPALATOPHARYNGOPLASTY  04/26/2006   w/  TONSILLECTOMY/  TURBINATE REDUCTIONS/  BILATERAL ANTERIOR ETHMOIDECTOMY   Family History  Problem Relation Age of Onset   Diabetes Mother    Heart disease Mother    Diabetes Father    Anuerysm Father    Diabetes Brother    Heart disease Brother    Diabetes Brother    Heart disease Brother    Breast cancer Neg Hx    Social History   Socioeconomic History    Marital status: Married    Spouse name: Daryl   Number of children: Not on file   Years of education: Not on file   Highest education level: Not on file  Occupational History    Comment: retired  Tobacco Use   Smoking status: Former    Packs/day: 0.50    Years: 10.00    Additional pack years: 0.00    Total pack years: 5.00    Types: Cigarettes    Quit date: 03/07/1995    Years since quitting: 27.4    Passive exposure: Never   Smokeless tobacco: Never  Vaping Use   Vaping Use: Never used  Substance and Sexual Activity   Alcohol use: Yes    Comment: rare   Drug use: Never   Sexual activity: Not on file  Other Topics Concern   Not on file  Social History Narrative   Lives with husband   Social Determinants of Health   Financial Resource Strain: Low Risk  (08/13/2022)   Overall Financial Resource Strain (CARDIA)    Difficulty of Paying Living Expenses: Not hard at all  Food Insecurity: No Food Insecurity (08/13/2022)   Hunger Vital Sign    Worried About Running Out of Food in the Last Year: Never true    Ran Out of Food in the Last Year: Never true  Transportation Needs: No Transportation Needs (08/13/2022)   PRAPARE - Hydrologist (Medical): No    Lack of Transportation (Non-Medical): No  Physical Activity: Inactive (08/13/2022)   Exercise Vital Sign    Days of Exercise per Week: 0 days    Minutes of Exercise per Session: 0 min  Stress: No Stress Concern Present (08/13/2022)   [place]    Feeling of Stress : Not at all  Social Connections: [place] (08/13/2022)   Social Connection and Isolation Panel [NHANES]    Frequency of Communication with Friends and Family: More than three times a week    Frequency of Social Gatherings with Friends and Family: More than three times a week    Attends Religious Services: More than 4 times per year    Active Member of [employer] or  Organizations:  Yes    Attends Archivist Meetings: More than 4 times per year    Marital Status: Married    Tobacco Counseling Counseling given: Not Answered   Clinical Intake:  Pre-visit preparation completed: Yes  Pain : No/denies pain  Diabetes: No  How often do you need to have someone help you when you read instructions, pamphlets, or other written materials from your doctor or pharmacy?: 1 - Never  Diabetic?No   Interpreter Needed?: No  Information entered by :: Denman George LPN   Activities of Daily Living    08/13/2022    2:14 PM  In your present state of health, do you have any difficulty performing the following activities:  Hearing? 0  Vision? 0  Difficulty concentrating or making decisions? 0  Walking or climbing stairs? 0  Dressing or bathing? 0  Doing errands, shopping? 0  Preparing Food and eating ? N  Using the Toilet? N  In the past six months, have you accidently leaked urine? N  Do you have problems with loss of bowel control? N  Managing your Medications? N  Managing your Finances? N  Housekeeping or managing your Housekeeping? N    Patient Care Team: Susy Frizzle, MD as PCP - General (Family Medicine) Bo Merino, MD as Consulting Physician (Rheumatology) Edythe Clarity, [facility] as Pharmacist (Pharmacist)  Indicate any recent Medical Services you may have received from other than [facility] providers in the past year (date may be approximate).     Assessment:   This is a routine wellness examination for Jones.  Hearing/Vision screen Hearing Screening - Comments:: Denies hearing difficulties  Vision Screening - Comments:: Wears rx glasses - up to date with routine eye exams with [place] issues and exercise activities discussed: Current Exercise Habits: The patient does not participate in regular exercise at present   Goals Addressed             This Visit's Progress    COMPLETED: Patient Stated        I would like to get to pain management to get botox injections in my back      Remain active and independent       COMPLETED: Track and Manage My Blood Pressure-Hypertension       Timeframe:  Long-Range Goal Priority:  High Start Date: 04/10/21                            Expected End Date:  10/08/21                     Follow Up Date 07/11/21    - check blood pressure weekly - choose a place to take my blood pressure (home, clinic or office, retail store) - write blood pressure results in a log or diary    Why is this important?   You won't feel high blood pressure, but it can still hurt your blood vessels.  High blood pressure can cause heart or kidney problems. It can also cause a stroke.  Making lifestyle changes like losing a little weight or eating less salt will help.  Checking your blood pressure at home and at different times of the day can help to control blood pressure.  If the doctor prescribes medicine remember to take it the way the doctor ordered.  Call the office if you cannot afford the medicine or if there are questions about  it.     Notes:       Depression Screen    08/13/2022    2:12 PM 07/30/2022    2:03 PM 04/30/2022    2:28 PM 04/07/2022    3:43 PM 03/12/2022   12:19 PM 01/02/2022    2:10 PM 11/13/2021    1:39 PM  PHQ 2/9 Scores  PHQ - 2 Score 0 0 2 0 0 0 2  PHQ- 9 Score   7        Fall Risk    08/13/2022    2:09 PM 07/30/2022    2:03 PM 04/30/2022    2:29 PM 04/07/2022    3:43 PM 03/12/2022   12:19 PM  [place] in the past year? 0 0 1 0 1  Number falls in past yr: 0 0 0 0 0  Injury with Fall? 0 0 0 0 0  Risk for fall due to : No Fall Risks No Fall Risks  No Fall Risks History of fall(s);Impaired balance/gait  Follow up Falls prevention discussed;Education provided;Falls evaluation completed Falls prevention discussed  Falls prevention discussed Falls prevention discussed;Education provided    FALL RISK PREVENTION PERTAINING TO THE  HOME:  Any stairs in or around the home? No  If so, are there any without handrails? No  Home free of loose throw rugs in walkways, pet beds, electrical cords, etc? Yes  Adequate lighting in your home to reduce risk of falls? Yes   ASSISTIVE DEVICES UTILIZED TO PREVENT FALLS:  Life alert? No  Use of a cane, walker or w/c? No  Grab bars in the bathroom? Yes  Shower chair or bench in shower? No  Elevated toilet seat or a handicapped toilet? Yes   TIMED UP AND GO:  Was the test performed? No . Telephonic visit   Cognitive Function:        08/13/2022    2:14 PM 08/07/2021    2:20 PM  6CIT Screen  What Year? 0 points 0 points  What month? 0 points 0 points  What time? 0 points 0 points  Count back from 20 0 points 0 points  Months in reverse 0 points 0 points  Repeat phrase 0 points 0 points  Total Score 0 points 0 points    Immunizations Immunization History  Administered Date(s) Administered   Fluad Quad(high Dose 65+) 01/26/2019, 03/05/2020, 03/18/2022   Influenza Whole 03/04/2012   Influenza, High Dose Seasonal PF 03/25/2018   Influenza,inj,Quad PF,6+ Mos 02/28/2013, 03/07/2014, 02/28/2015, 03/27/2016, 03/09/2017   Influenza-Unspecified 04/08/2021   PFIZER(Purple Top)SARS-COV-2 Vaccination 08/10/2019   Pneumococcal Conjugate-13 09/10/2016   Pneumococcal Polysaccharide-23 03/12/2006, 11/09/2011   Pneumococcal-Unspecified 08/31/2016   Unspecified SARS-COV-2 Vaccination 08/07/2019, 08/28/2019, 03/08/2020   Zoster Recombinat (Shingrix) 08/12/2021, 11/21/2021    TDAP status: Due, Education has been provided regarding the importance of this vaccine. Advised may receive this vaccine at local pharmacy or Health Dept. Aware to provide a copy of the vaccination record if obtained from local pharmacy or Health Dept. Verbalized acceptance and understanding.  Flu Vaccine status: Up to date  Pneumococcal vaccine status: Due, Education has been provided regarding the importance  of this vaccine. Advised may receive this vaccine at local pharmacy or Health Dept. Aware to provide a copy of the vaccination record if obtained from local pharmacy or Health Dept. Verbalized acceptance and understanding.  Covid-19 vaccine status: Information provided on how to obtain vaccines.   Qualifies for Shingles Vaccine? Yes   Zostavax completed No  Shingrix Completed?: Yes  Screening Tests Health Maintenance  Topic Date Due   Pneumonia Vaccine 70+ Years old (54 of 3 - PPSV23 or PCV20) 08/13/2022 (Originally 09/10/2021)   Hepatitis C Screening  08/13/2022 (Originally 03/29/1969)   Medicare Annual Wellness (AWV)  08/13/2023   MAMMOGRAM  06/12/2024   COLONOSCOPY (Pts 45-58yrs Insurance coverage will need to be confirmed)  09/28/2024   INFLUENZA VACCINE  Completed   DEXA SCAN  Completed   Zoster Vaccines- Shingrix  Completed   HPV VACCINES  Aged Out   DTaP/Tdap/Td  Discontinued   COVID-19 Vaccine  Discontinued    Health Maintenance  There are no preventive care reminders to display for this patient.   Colorectal cancer screening: Type of screening: Colonoscopy. Completed 09/29/19. Repeat every 5 years  Mammogram status: Completed 06/12/22. Repeat every year  Bone Density status: Completed 04/02/21. Results reflect: Bone density results: OSTEOPENIA. Repeat every 2 years.  Lung Cancer Screening: (Low Dose CT Chest recommended if Age 35-80 years, 30 pack-year currently smoking OR have quit w/in 15years.) does not qualify.   Lung Cancer Screening Referral: n/a  Additional Screening:  Hepatitis C Screening: does qualify;   Vision Screening: Recommended annual ophthalmology exams for early detection of glaucoma and other disorders of the eye. Is the patient up to date with their annual eye exam?  Yes  Who is the provider or what is the name of the office in which the patient attends annual eye exams? [place]  If pt is not established with a provider, would they like to be  referred to a provider to establish care? No .   Dental Screening: Recommended annual dental exams for proper oral hygiene  Community Resource Referral / Chronic Care Management: CRR required this visit?  No   CCM required this visit?  No      Plan:     I have personally reviewed and noted the following in the patient's chart:   Medical and social history Use of alcohol, tobacco or illicit drugs  Current medications and supplements including opioid prescriptions. Patient is currently taking opioid prescriptions. Information provided to patient regarding non-opioid alternatives. Patient advised to discuss non-opioid treatment plan with their provider. Functional ability and status Nutritional status Physical activity Advanced directives List of other physicians Hospitalizations, surgeries, and ER visits in previous 12 months Vitals Screenings to include cognitive, depression, and falls Referrals and appointments  In addition, I have reviewed and discussed with patient certain preventive protocols, quality metrics, and best practice recommendations. A written personalized care plan for preventive services as well as general preventive health recommendations were provided to patient.     Vanetta Mulders, [place]   [id number]   Due to this being a virtual visit, the after visit summary with patients personalized plan was offered to patient via mail or my-chart. per request, patient was mailed a copy of AVS.  Nurse Notes: No concerns

## 2022-08-14 ENCOUNTER — Ambulatory Visit: Payer: Medicare Other | Admitting: Physical Therapy

## 2022-08-14 ENCOUNTER — Encounter: Payer: Self-pay | Admitting: Physical Therapy

## 2022-08-14 DIAGNOSIS — M542 Cervicalgia: Secondary | ICD-10-CM

## 2022-08-14 DIAGNOSIS — M6281 Muscle weakness (generalized): Secondary | ICD-10-CM

## 2022-08-14 NOTE — Therapy (Signed)
OUTPATIENT PHYSICAL THERAPY TREATMENT NOTE   Patient Name: Janet Peterson MRN: [id number] DOB:09/07/1950, 72 y.o., female Today's Date: 08/14/2022  PCP: Susy Frizzle, MD   REFERRING PROVIDER: Loanne Drilling, PA-C    END OF SESSION:   PT End of Session - 08/14/22 1314     Visit Number 6    Number of Visits 17    Date for PT Re-Evaluation 09/15/22    Authorization Type [employer] MCR    Progress Note Due on Visit 10    PT Start Time 1312    PT Stop Time 1356    PT Time Calculation (min) 44 min                Past Medical History:  Diagnosis Date   Ankylosing spondylitis ([place])    Anxiety and depression    Depression    Fibromyalgia    GERD (gastroesophageal reflux disease)    Hiatal hernia    per patient, dx by GI    History of basal cell carcinoma excision    FACE, 1992  &  1996   History of hiatal hernia    History of thrush    Hyperlipidemia    Hypertension    Insomnia    Left breast mass    OCD (obsessive compulsive disorder)    Osteopenia    PONV (postoperative nausea and vomiting)    Psoriatic arthritis ([place])    PVC (premature ventricular contraction)    Rheumatoid arthritis ([place])    Past Surgical History:  Procedure Laterality Date   BIOPSY  09/29/2019   Procedure: BIOPSY;  Surgeon: Ronnette Juniper, MD;  Location: [facility];  Service: Gastroenterology;;   BREAST EXCISIONAL BIOPSY Right    BREAST EXCISIONAL BIOPSY Left 05/04/2018   BREAST LUMPECTOMY WITH RADIOACTIVE SEED LOCALIZATION Left 05/04/2018   Procedure: LEFT BREAST LUMPECTOMY WITH RADIOACTIVE SEED LOCALIZATION AND LEFT BREAST NIPPLE BIOPSY;  Surgeon: Jovita Kussmaul, MD;  Location: [facility];  Service: General;  Laterality: Left;   BREAST SURGERY  05/25/1973   lumpectomy-- benign   BUNIONECTOMY  05/25/1989   CATARACT EXTRACTION W/ INTRAOCULAR LENS  IMPLANT, BILATERAL  05/25/1998   CERVICAL FUSION  07/24/2011   C5 -- C7   CESAREAN SECTION  05/26/1983   COLONOSCOPY WITH  PROPOFOL N/A 09/29/2019   Procedure: COLONOSCOPY WITH PROPOFOL;  Surgeon: Ronnette Juniper, MD;  Location: [facility];  Service: Gastroenterology;  Laterality: N/A;   DISTAL INTERPHALANGEAL JOINT FUSION Right 03/14/2015   Procedure: RIGHT LONG FINGER DISTAL INTERPHALANGEAL JOINT ARTHRODESIS;  Surgeon: Iran Planas, MD;  Location: [facility];  Service: Orthopedics;  Laterality: Right;   ESOPHAGOGASTRODUODENOSCOPY (EGD) WITH PROPOFOL N/A 09/29/2019   Procedure: ESOPHAGOGASTRODUODENOSCOPY (EGD) WITH PROPOFOL;  Surgeon: Ronnette Juniper, MD;  Location: [facility];  Service: Gastroenterology;  Laterality: N/A;   EYE SURGERY  05/25/1993   rk (laser surgery), semi cornea transplant, detacted retina,  fluid removal   HYSTEROSCOPY WITH D & C N/A 12/09/2020   Procedure: DILATATION AND CURETTAGE /HYSTEROSCOPY;  Surgeon: Drema Dallas, DO;  Location: [facility];  Service: Gynecology;  Laterality: N/A;   KNEE ARTHROSCOPY Left 03/03/2004   POLYPECTOMY  09/29/2019   Procedure: POLYPECTOMY;  Surgeon: Ronnette Juniper, MD;  Location: [facility];  Service: Gastroenterology;;   POSTERIOR VITRECTOMY RIGHT EYE AND LASER   10/27/1999   SHOULDER SURGERY Right 05/26/1995   SKIN BIOPSY  05/2022   lower stomach   SPINAL FIXATION SURGERY W/ IMPLANT  2013 rod #1//  2014  rod 2   S1 -- T10  (rod #1)//   S1 -- T4 (rod #2)   THORACIC FUSION  03/13/2013   REMOVAL HARDWARE/  BONE GRAFT FUSION T10//  REVISION OF RODS   TOTAL KNEE ARTHROPLASTY Left 12/14/2005   UVULOPALATOPHARYNGOPLASTY  04/26/2006   w/  TONSILLECTOMY/  TURBINATE REDUCTIONS/  BILATERAL ANTERIOR ETHMOIDECTOMY   Patient Active Problem List   Diagnosis Date Noted   Thickened endometrium 07/30/2022   History of adenomatous polyp of colon 07/30/2022   Bacterial conjunctivitis of right eye 04/30/2022   Contact dermatitis 04/30/2022   Lesion of liver 06/20/2021   C. difficile colitis 09/29/2019   Colitis 09/28/2019   Anxiety and  depression 09/28/2019   DDD (degenerative disc disease), cervical s/p fusion 11/12/2016   H/O total knee replacement, left 05/06/2016   DDD lumbar spine status post fusion 05/06/2016   Psoriasis 05/05/2016   High risk medication use 05/05/2016   Cervical post-laminectomy syndrome 12/09/2015   Thoracic postlaminectomy syndrome 12/09/2015   Psoriatic arthritis ([place]) 08/23/2012   Osteopenia 08/23/2012   HLD (hyperlipidemia) 08/23/2012   RLS (restless legs syndrome) 08/23/2012   Insomnia 08/23/2012   Fibromyalgia syndrome 08/12/2012   Low back pain 08/12/2012   Syncope    PVC (premature ventricular contraction)    OCD (obsessive compulsive disorder)    GERD (gastroesophageal reflux disease)    Hypertension     REFERRING DIAG: M54.12 (ICD-10-CM) - Radiculopathy, cervical region  THERAPY DIAG:  Cervicalgia  Muscle weakness (generalized)  Rationale for Evaluation and Treatment Rehabilitation  PERTINENT HISTORY:  HTN, PVCs, GERD, hx Cdiff, osteopenia, hx lumbar and cervical fusions (about 10 years ago), fibromyalgia, anxiety/depression, hx of skin cancer  PRECAUTIONS: multiple fusions, remote thoracic compression fractures, osteopenia  SUBJECTIVE:                                                                                                                                                                                      SUBJECTIVE STATEMENT:   Pt reports improvement in ability to hold head up longer.    PAIN:  Are you having pain: 5/10  Location/description: aching in neck Best-worst over past week: 0-7/10 (0-9/10 on eval) Per eval -  - aggravating factors: walking, laundry, kitchen/dishes, upper body - Easing factors: resting      OBJECTIVE: (objective measures completed at initial evaluation unless otherwise dated)   DIAGNOSTIC FINDINGS:  Thoracic MRI Aug 2023 IMPRESSION: No acute abnormality or change compared to the prior exams.   Remote mild superior  endplate compression fracture of T4 and severe compression fracture T10. The patient is status post decompression at T10 and thoracolumbar fusion without evidence  of complication.   PATIENT SURVEYS:  FOTO 38 current, 17 predicted   COGNITION: Overall cognitive status: Within functional limits for tasks assessed   SENSATION: No reported neuro/sensory deficits, red flag questioning reassuring    POSTURE: forward head, kyphosis   PALPATION: Palpable tightness periscapular musculature BIL             CERVICAL ROM:    Active ROM A/PROM (deg) eval AROM 07/29/22 AROM  08/14/22  Flexion 75% pull    Extension 50% R sided pain     Right lateral flexion      Left lateral flexion      Right rotation 35 deg 40 55  Left rotation 50 deg  55   (Blank rows = not tested) (Key: WFL = within functional limits not formally assessed, * = concordant pain, s = stiffness/stretching sensation, NT = not tested)    UPPER EXTREMITY ROM:   A/PROM Right eval Left eval  Shoulder flexion 132 deg 129 deg  Shoulder abduction      Shoulder internal rotation      Shoulder external rotation      Elbow flexion      Elbow extension      Wrist flexion      Wrist extension       (Blank rows = not tested) (Key: WFL = within functional limits not formally assessed, * = concordant pain, s = stiffness/stretching sensation, NT = not tested)  Comments:     UPPER EXTREMITY MMT:   MMT Right eval Left eval  Shoulder flexion 4 4  Shoulder extension      Shoulder abduction 4 4  Shoulder extension      Shoulder internal rotation      Shoulder external rotation      Elbow flexion      Elbow extension      Grip strength      (Blank rows = not tested)  (Key: WFL = within functional limits not formally assessed, * = concordant pain, s = stiffness/stretching sensation, NT = not tested)  Comments:    CERVICAL SPECIAL TESTS:  Deferred given presence of hardware   FUNCTIONAL TESTS:  Overhead reach limited as  above   TODAY'S TREATMENT:                                                                                               [facility] Adult PT Treatment:                                                DATE: 08/14/22 Therapeutic Exercise: Seated chin tuck 10 x 2  Seated scap retract 10 x 2  Standing green band row 10 x 2 Standing red band shoulder ext 10 x 2  Seated AROM lateral flexion x 5 each way LS stretch 3 x 30 sec each Cervical rotation AROM  x 5 each way Cervical ext AROM x 5  UT stretch 3 breaths each way Dowel chest press +  OH arc 2# 2x10 cues for form and pacing  Supine with head on blue deflated ball, cervical rotation AROM x 10, 3 sec head press x 10  Manual Therapy: Supine; Suboccipital release + STM to pt tolerance, STM paracervicals and UT, LS    [facility] Adult PT Treatment:                                                DATE: 08/10/22 Therapeutic Exercise: Dowel chest press + OH arc 2# 2x10 cues for form and pacing  Seated chin tuck 2x12 (in chair for back support) cues for reduced compensations RTB 3x10 rows cues form and reduced UT compensation LS stretch 3x30sec  UT stretch 4x30 sec HEP discussion, hold on sidebending isos for now  Manual Therapy: Supine; gentle cervical distraction, suboccipital release, STM paracervical musculature, LS, UT BIL to pt tolerance    [facility] Adult PT Treatment:                                                DATE: 08/06/22 Therapeutic Exercise: Seated chin tuck 2x10 cues for reduced compensations  Seated cervical sidebending isometrics 2x5 B cues for setup, appropriate force output  Scapular retraction x10  RTB rows, standing, 2x10 cues for form, posture, and mechanics  Dowel chest press + OH arc 2x12 cues for pacing  HEP update + education Manual Therapy: Supine; Suboccipital release + STM to pt tolerance, STM paracervicals and UT, LS                                 [facility] Adult PT Treatment:                                                 DATE: 08/04/22 Therapeutic Exercise: Supine Chin tuck 1 x 10  Cervical rotation/ sidebending isometrics 1 x 10 ea holding 5 sec ea.  Levator scapulae stretch 2 x 30 sec Upper trap stretch 2 x 30 sec Scapular retraction 1 x 10 hold  Updated HEP for cervical isometrics, seated/ supine chin tuck. Manual Therapy: Sub-occipital release - taught how it can be done with tennis balls  Tack and stretch of cervical paraspinals/ sub-occipitals MTPR along the cervical parapsinals/ bil upper traps How to perform self trigger point release with theracane    PATIENT EDUCATION:  Education details: rationale for interventions, HEP update  Person educated: Patient Education method: Explanation, Demonstration, Tactile cues, Verbal cues, and Handouts Education comprehension: verbalized understanding, returned demonstration, verbal cues required, tactile cues required, and needs further education     HOME EXERCISE PROGRAM: Access Code: [email or web address] URL: [email or web address] Date: 08/06/2022 Prepared by: Enis Slipper  Exercises - Gentle Levator Scapulae Stretch  - 1 x daily - 7 x weekly - 2 sets - 3 reps - 30sec  hold - Supine Shoulder Horizontal Abduction with Resistance  - 1 x daily - 7 x weekly - 1-2 sets - 10 reps - Seated Isometric Cervical Sidebending  - 1 x daily - 7 x weekly - 2 sets -  10 reps - 5 sec hold (* verbal hold for now 08/10/22)  - Seated Cervical Retraction  - 1 x daily - 7 x weekly - 2 sets - 10 reps - 5 sec hold   ASSESSMENT:   CLINICAL IMPRESSION:  Pt arrives to session w/ 5/10 pain, continues to report good adherence to HEP  and sees improvement in endurance for holding head up. Progressed resistance band strength with good tolerance. Pt continues to endorse good relief from manual at end of session.Cervical AROM improved.    EVAL: On exam pt does demo postural deficits, reduced cervical/GH mobility. Tolerates HEP well without issue, cues/education provided for  appropriate performance. Did advise on avoiding overexertion with HEP and monitoring symptoms as PT goes on given pt report of symptom behavior, pt verbalizes agreement/understanding with plan as outlined below. No adverse events or increases in pain reported today. Recommend skilled PT to address aforementioned deficits and maximize functional tolerance. Pt departs today's session in no acute distress, all voiced questions/concerns addressed appropriately from PT perspective.     OBJECTIVE IMPAIRMENTS: decreased activity tolerance, decreased endurance, decreased mobility, decreased ROM, decreased strength, hypomobility, impaired perceived functional ability, impaired UE functional use, improper body mechanics, postural dysfunction, and pain.    ACTIVITY LIMITATIONS: carrying, lifting, bending, standing, dressing, reach over head, hygiene/grooming, and locomotion level   PARTICIPATION LIMITATIONS: meal prep, cleaning, laundry, and driving   PERSONAL FACTORS: Age, Time since onset of injury/illness/exacerbation, and 3+ comorbidities: HTN, osteopenia, hx lumbar and cervical fusions, fibromyalgia, anxiety/depression  are also affecting patient's functional outcome.    REHAB POTENTIAL: Fair given chronicity and comorbidities   CLINICAL DECISION MAKING: Evolving/moderate complexity   EVALUATION COMPLEXITY: Moderate     GOALS: Goals reviewed with patient? No   SHORT TERM GOALS: Target date: 08/18/2022 Pt will demonstrate appropriate understanding and performance of initially prescribed HEP in order to facilitate improved independence with management of symptoms.  Baseline: HEP provided on eval Goal status: INITIAL    2. Pt will score greater than or equal to 44 on FOTO in order to demonstrate improved perception of function due to symptoms.            Baseline: 38            Goal status: INITIAL     LONG TERM GOALS: Target date: 09/15/2022   Pt will score 49 on FOTO in order to demonstrate  improved perception of function due to symptoms. Baseline: 38 Goal status: INITIAL   2. Pt will demonstrate at least 45 degrees of active cervical rotation ROM BIL in order to demonstrate improved environmental awareness and safety with driving.  Baseline: see ROM chart above Goal status: INITIAL   3. Pt will demonstrate at least 4+/5 shoulder MMT for improved symmetry of UE strength and improved tolerance to functional movements.  Baseline: see MMT chart above Goal status: INITIAL    4. Pt will report/demonstrate ability to stand/walk for up to 76min with less than 2 point increase on NPS in order to demonstrate improved tolerance to household activities. Baseline: up to 9/10 pain with prolonged activities Goal status: INITIAL                         5. Pt will report/demonstrate independence with final prescribed HEP in order to facilitate improved self efficacy with management of symptoms.             Baseline: TBD  Goal status: INITIAL                                     PLAN:   PT FREQUENCY: 2x/week   PT DURATION: 8 weeks   PLANNED INTERVENTIONS: Therapeutic exercises, Therapeutic activity, Neuromuscular re-education, Balance training, Gait training, Patient/Family education, Self Care, Vestibular training, Dry Needling, Cryotherapy, Moist heat, Taping, Manual therapy, and Re-evaluation   PLAN FOR NEXT SESSION: gentle postural stability/strengthening, cervical mobility as appropriate. Consider STM to reduce tightness PRN.         Hessie Diener, PTA 08/14/22 1:56 PM Phone: [phone] Fax: [phone]

## 2022-08-18 ENCOUNTER — Ambulatory Visit: Payer: Medicare Other | Admitting: Physical Therapy

## 2022-08-18 ENCOUNTER — Encounter: Payer: Self-pay | Admitting: Physical Therapy

## 2022-08-18 DIAGNOSIS — M6281 Muscle weakness (generalized): Secondary | ICD-10-CM

## 2022-08-18 DIAGNOSIS — R293 Abnormal posture: Secondary | ICD-10-CM

## 2022-08-18 DIAGNOSIS — M542 Cervicalgia: Secondary | ICD-10-CM | POA: Diagnosis not present

## 2022-08-18 NOTE — Therapy (Signed)
OUTPATIENT PHYSICAL THERAPY TREATMENT NOTE   Patient Name: Janet Peterson MRN: [id number] DOB:Dec 10, 1950, 72 y.o., female Today's Date: 08/18/2022  PCP: Susy Frizzle, MD   REFERRING PROVIDER: Loanne Drilling, PA-C    END OF SESSION:   PT End of Session - 08/18/22 1502     Visit Number 7    Number of Visits 17    Date for PT Re-Evaluation 09/15/22    Authorization Type [employer] MCR    Progress Note Due on Visit 10    PT Start Time 1503    PT Stop Time 1550    PT Time Calculation (min) 47 min    Activity Tolerance Patient tolerated treatment well;No increased pain    Behavior During Therapy WFL for tasks assessed/performed                 Past Medical History:  Diagnosis Date   Ankylosing spondylitis ([place])    Anxiety and depression    Depression    Fibromyalgia    GERD (gastroesophageal reflux disease)    Hiatal hernia    per patient, dx by GI    History of basal cell carcinoma excision    FACE, 1992  &  1996   History of hiatal hernia    History of thrush    Hyperlipidemia    Hypertension    Insomnia    Left breast mass    OCD (obsessive compulsive disorder)    Osteopenia    PONV (postoperative nausea and vomiting)    Psoriatic arthritis ([facility])    PVC (premature ventricular contraction)    Rheumatoid arthritis ([place])    Past Surgical History:  Procedure Laterality Date   BIOPSY  09/29/2019   Procedure: BIOPSY;  Surgeon: Ronnette Juniper, MD;  Location: [facility];  Service: Gastroenterology;;   BREAST EXCISIONAL BIOPSY Right    BREAST EXCISIONAL BIOPSY Left 05/04/2018   BREAST LUMPECTOMY WITH RADIOACTIVE SEED LOCALIZATION Left 05/04/2018   Procedure: LEFT BREAST LUMPECTOMY WITH RADIOACTIVE SEED LOCALIZATION AND LEFT BREAST NIPPLE BIOPSY;  Surgeon: Jovita Kussmaul, MD;  Location: [facility];  Service: General;  Laterality: Left;   BREAST SURGERY  05/25/1973   lumpectomy-- benign   BUNIONECTOMY  05/25/1989   CATARACT EXTRACTION W/ INTRAOCULAR  LENS  IMPLANT, BILATERAL  05/25/1998   CERVICAL FUSION  07/24/2011   C5 -- C7   CESAREAN SECTION  05/26/1983   COLONOSCOPY WITH PROPOFOL N/A 09/29/2019   Procedure: COLONOSCOPY WITH PROPOFOL;  Surgeon: Ronnette Juniper, MD;  Location: [facility];  Service: Gastroenterology;  Laterality: N/A;   DISTAL INTERPHALANGEAL JOINT FUSION Right 03/14/2015   Procedure: RIGHT LONG FINGER DISTAL INTERPHALANGEAL JOINT ARTHRODESIS;  Surgeon: Iran Planas, MD;  Location: [facility];  Service: Orthopedics;  Laterality: Right;   ESOPHAGOGASTRODUODENOSCOPY (EGD) WITH PROPOFOL N/A 09/29/2019   Procedure: ESOPHAGOGASTRODUODENOSCOPY (EGD) WITH PROPOFOL;  Surgeon: Ronnette Juniper, MD;  Location: [facility];  Service: Gastroenterology;  Laterality: N/A;   EYE SURGERY  05/25/1993   rk (laser surgery), semi cornea transplant, detacted retina,  fluid removal   HYSTEROSCOPY WITH D & C N/A 12/09/2020   Procedure: DILATATION AND CURETTAGE /HYSTEROSCOPY;  Surgeon: Drema Dallas, DO;  Location: [facility];  Service: Gynecology;  Laterality: N/A;   KNEE ARTHROSCOPY Left 03/03/2004   POLYPECTOMY  09/29/2019   Procedure: POLYPECTOMY;  Surgeon: Ronnette Juniper, MD;  Location: [facility];  Service: Gastroenterology;;   POSTERIOR VITRECTOMY RIGHT EYE AND LASER   10/27/1999   SHOULDER SURGERY Right 05/26/1995  SKIN BIOPSY  05/2022   lower stomach   SPINAL FIXATION SURGERY W/ IMPLANT  2013 rod #1//  2014  rod 2   S1 -- T10  (rod #1)//   S1 -- T4 (rod #2)   THORACIC FUSION  03/13/2013   REMOVAL HARDWARE/  BONE GRAFT FUSION T10//  REVISION OF RODS   TOTAL KNEE ARTHROPLASTY Left 12/14/2005   UVULOPALATOPHARYNGOPLASTY  04/26/2006   w/  TONSILLECTOMY/  TURBINATE REDUCTIONS/  BILATERAL ANTERIOR ETHMOIDECTOMY   Patient Active Problem List   Diagnosis Date Noted   Thickened endometrium 07/30/2022   History of adenomatous polyp of colon 07/30/2022   Bacterial conjunctivitis of right eye 04/30/2022    Contact dermatitis 04/30/2022   Lesion of liver 06/20/2021   C. difficile colitis 09/29/2019   Colitis 09/28/2019   Anxiety and depression 09/28/2019   DDD (degenerative disc disease), cervical s/p fusion 11/12/2016   H/O total knee replacement, left 05/06/2016   DDD lumbar spine status post fusion 05/06/2016   Psoriasis 05/05/2016   High risk medication use 05/05/2016   Cervical post-laminectomy syndrome 12/09/2015   Thoracic postlaminectomy syndrome 12/09/2015   Psoriatic arthritis ([place]) 08/23/2012   Osteopenia 08/23/2012   HLD (hyperlipidemia) 08/23/2012   RLS (restless legs syndrome) 08/23/2012   Insomnia 08/23/2012   Fibromyalgia syndrome 08/12/2012   Low back pain 08/12/2012   Syncope    PVC (premature ventricular contraction)    OCD (obsessive compulsive disorder)    GERD (gastroesophageal reflux disease)    Hypertension     REFERRING DIAG: M54.12 (ICD-10-CM) - Radiculopathy, cervical region  THERAPY DIAG:  Cervicalgia  Muscle weakness (generalized)  Abnormal posture  Rationale for Evaluation and Treatment Rehabilitation  PERTINENT HISTORY:  HTN, PVCs, GERD, hx Cdiff, osteopenia, hx lumbar and cervical fusions (about 10 years ago), fibromyalgia, anxiety/depression, hx of skin cancer  PRECAUTIONS: multiple fusions, remote thoracic compression fractures, osteopenia  SUBJECTIVE:                                                                                                                                                                                      SUBJECTIVE STATEMENT:   "I've been having normal aching in my neck. I've been able to hold my neck up better in the store." Pt also reports that she has been keeping up with her home exercises.    PAIN:  Are you having pain: 6/10 (took pain meds prior to session)  Location/description: aching in neck Best-worst over past week: 0-7/10 (0-9/10 on eval) Per eval -  - aggravating factors: walking, laundry,  kitchen/dishes, upper body - Easing factors: resting      OBJECTIVE: (objective measures completed at initial  evaluation unless otherwise dated)   DIAGNOSTIC FINDINGS:  Thoracic MRI Aug 2023 IMPRESSION: No acute abnormality or change compared to the prior exams.   Remote mild superior endplate compression fracture of T4 and severe compression fracture T10. The patient is status post decompression at T10 and thoracolumbar fusion without evidence of complication.   PATIENT SURVEYS:  FOTO 38 current, 87 predicted   COGNITION: Overall cognitive status: Within functional limits for tasks assessed   SENSATION: No reported neuro/sensory deficits, red flag questioning reassuring    POSTURE: forward head, kyphosis   PALPATION: Palpable tightness periscapular musculature BIL             CERVICAL ROM:    Active ROM A/PROM (deg) eval AROM 07/29/22 AROM  08/14/22  Flexion 75% pull    Extension 50% R sided pain     Right lateral flexion      Left lateral flexion      Right rotation 35 deg 40 55  Left rotation 50 deg  55   (Blank rows = not tested) (Key: WFL = within functional limits not formally assessed, * = concordant pain, s = stiffness/stretching sensation, NT = not tested)    UPPER EXTREMITY ROM:   A/PROM Right eval Left eval  Shoulder flexion 132 deg 129 deg  Shoulder abduction      Shoulder internal rotation      Shoulder external rotation      Elbow flexion      Elbow extension      Wrist flexion      Wrist extension       (Blank rows = not tested) (Key: WFL = within functional limits not formally assessed, * = concordant pain, s = stiffness/stretching sensation, NT = not tested)  Comments:     UPPER EXTREMITY MMT:   MMT Right eval Left eval  Shoulder flexion 4 4  Shoulder extension      Shoulder abduction 4 4  Shoulder extension      Shoulder internal rotation      Shoulder external rotation      Elbow flexion      Elbow extension      Grip strength       (Blank rows = not tested)  (Key: WFL = within functional limits not formally assessed, * = concordant pain, s = stiffness/stretching sensation, NT = not tested)  Comments:    CERVICAL SPECIAL TESTS:  Deferred given presence of hardware   FUNCTIONAL TESTS:  Overhead reach limited as above   TODAY'S TREATMENT:                                                                                               [facility] Adult PT Treatment:                                                DATE: 08/18/2022 Therapeutic Exercise: Supine chin tuck  1 x 15 holding 3 seconds Supine chin tuck head lift 1  x 10 holding 3 seconds Seated scap retract 2 x 10 while maintaining chin tuck Supine Cervical rotation AROM  x 5 each way Seated UT stretch 2 x 20 sec  Updated HEP: first rib mobilization and supine chin tuck head lifts  Manual Therapy: Supine; Suboccipital release + STM to pt tolerance, STM paracervicals and UT, LS Supine; Manual cervical traction  Supine; L/R first rib mobilization grade III   [facility] Adult PT Treatment:                                                DATE: 08/14/22 Therapeutic Exercise: Seated chin tuck 10 x 2  Seated scap retract 10 x 2  Standing green band row 10 x 2 Standing red band shoulder ext 10 x 2  Seated AROM lateral flexion x 5 each way LS stretch 3 x 30 sec each Cervical rotation AROM  x 5 each way Cervical ext AROM x 5  UT stretch 3 breaths each way Dowel chest press + OH arc 2# 2x10 cues for form and pacing  Supine with head on blue deflated ball, cervical rotation AROM x 10, 3 sec head press x 10  Manual Therapy: Supine; Suboccipital release + STM to pt tolerance, STM paracervicals and UT, LS    [facility] Adult PT Treatment:                                                DATE: 08/10/22 Therapeutic Exercise: Dowel chest press + OH arc 2# 2x10 cues for form and pacing  Seated chin tuck 2x12 (in chair for back support) cues for reduced compensations RTB 3x10 rows  cues form and reduced UT compensation LS stretch 3x30sec  UT stretch 4x30 sec HEP discussion, hold on sidebending isos for now  Manual Therapy: Supine; gentle cervical distraction, suboccipital release, STM paracervical musculature, LS, UT BIL to pt tolerance          PATIENT EDUCATION:  Education details: rationale for interventions, HEP update  Person educated: Patient Education method: Explanation, Demonstration, Tactile cues, Verbal cues, and Handouts Education comprehension: verbalized understanding, returned demonstration, verbal cues required, tactile cues required, and needs further education     HOME EXERCISE PROGRAM: Access Code: [email or web address] URL: [email or web address] Date: 08/18/2022 Prepared by: Starr Lake  Exercises - Gentle Levator Scapulae Stretch  - 1 x daily - 7 x weekly - 2 sets - 3 reps - 30sec  hold - Supine Shoulder Horizontal Abduction with Resistance  - 1 x daily - 7 x weekly - 1-2 sets - 10 reps - Seated Isometric Cervical Sidebending  - 1 x daily - 7 x weekly - 2 sets - 10 reps - 5 sec hold - Seated Cervical Retraction  - 1 x daily - 7 x weekly - 2 sets - 10 reps - 5 sec hold - First Rib Mobilization with Strap  - 1 x daily - 2 sets - 10 reps - 5 hold - Seated Upper Trapezius Stretch  - 1 x daily - 7 x weekly - 2 sets - 2 reps - 30 seconds hold - Supine Deep Neck Flexor Training - Repetitions  - 1 x daily - 7 x weekly - 2 sets - 10 reps -  5 seconds hold   ASSESSMENT:   CLINICAL IMPRESSION:  Patient is reporting decreased pain from 6/10 to 4/10 NPRS at end of session. She reported feeling symptom relief and improved cervical rotation AROM following manual therapy with initiation of grade III first rib mobilization bilaterally. Continued to use manual techniques to reduce muscle tension in the neck, and patient continues to note improvement.    EVAL: On exam pt does demo postural deficits, reduced cervical/GH mobility. Tolerates HEP well  without issue, cues/education provided for appropriate performance. Did advise on avoiding overexertion with HEP and monitoring symptoms as PT goes on given pt report of symptom behavior, pt verbalizes agreement/understanding with plan as outlined below. No adverse events or increases in pain reported today. Recommend skilled PT to address aforementioned deficits and maximize functional tolerance. Pt departs today's session in no acute distress, all voiced questions/concerns addressed appropriately from PT perspective.     OBJECTIVE IMPAIRMENTS: decreased activity tolerance, decreased endurance, decreased mobility, decreased ROM, decreased strength, hypomobility, impaired perceived functional ability, impaired UE functional use, improper body mechanics, postural dysfunction, and pain.    ACTIVITY LIMITATIONS: carrying, lifting, bending, standing, dressing, reach over head, hygiene/grooming, and locomotion level   PARTICIPATION LIMITATIONS: meal prep, cleaning, laundry, and driving   PERSONAL FACTORS: Age, Time since onset of injury/illness/exacerbation, and 3+ comorbidities: HTN, osteopenia, hx lumbar and cervical fusions, fibromyalgia, anxiety/depression  are also affecting patient's functional outcome.    REHAB POTENTIAL: Fair given chronicity and comorbidities   CLINICAL DECISION MAKING: Evolving/moderate complexity   EVALUATION COMPLEXITY: Moderate     GOALS: Goals reviewed with patient? No   SHORT TERM GOALS: Target date: 08/18/2022 Pt will demonstrate appropriate understanding and performance of initially prescribed HEP in order to facilitate improved independence with management of symptoms.  Baseline: HEP provided on eval Goal status: MET 08/18/2022   2. Pt will score greater than or equal to 44 on FOTO in order to demonstrate improved perception of function due to symptoms.            Baseline: 38            Goal status: PARTIALLY MET 08/18/2022   LONG TERM GOALS: Target date:  09/15/2022   Pt will score 49 on FOTO in order to demonstrate improved perception of function due to symptoms. Baseline: 38 Goal status: INITIAL   2. Pt will demonstrate at least 45 degrees of active cervical rotation ROM BIL in order to demonstrate improved environmental awareness and safety with driving.  Baseline: see ROM chart above Goal status: INITIAL   3. Pt will demonstrate at least 4+/5 shoulder MMT for improved symmetry of UE strength and improved tolerance to functional movements.  Baseline: see MMT chart above Goal status: INITIAL    4. Pt will report/demonstrate ability to stand/walk for up to 87min with less than 2 point increase on NPS in order to demonstrate improved tolerance to household activities. Baseline: up to 9/10 pain with prolonged activities Goal status: INITIAL                         5. Pt will report/demonstrate independence with final prescribed HEP in order to facilitate improved self efficacy with management of symptoms.             Baseline: TBD            Goal status: INITIAL  PLAN:   PT FREQUENCY: 2x/week   PT DURATION: 8 weeks   PLANNED INTERVENTIONS: Therapeutic exercises, Therapeutic activity, Neuromuscular re-education, Balance training, Gait training, Patient/Family education, Self Care, Vestibular training, Dry Needling, Cryotherapy, Moist heat, Taping, Manual therapy, and Re-evaluation   PLAN FOR NEXT SESSION: gentle postural stability/strengthening, cervical mobility as appropriate. Consider STM to reduce tightness PRN.  Consider thoracic ext mobility.     Wrangler Penning PT, DPT, LAT, ATC  08/18/22  4:03 PM

## 2022-08-19 NOTE — Therapy (Signed)
OUTPATIENT PHYSICAL THERAPY TREATMENT NOTE   Patient Name: Janet Peterson MRN: [id number] DOB:07-02-1950, 72 y.o., female Today's Date: 08/20/2022  PCP: Susy Frizzle, MD   REFERRING PROVIDER: Loanne Drilling, PA-C    END OF SESSION:   PT End of Session - 08/20/22 1458     Visit Number 8    Number of Visits 17    Date for PT Re-Evaluation 09/15/22    Authorization Type [employer] MCR    Progress Note Due on Visit 10    PT Start Time [id number]    PT Stop Time 1545    PT Time Calculation (min) 46 min    Activity Tolerance Patient tolerated treatment well;No increased pain    Behavior During Therapy WFL for tasks assessed/performed                  Past Medical History:  Diagnosis Date   Ankylosing spondylitis ([place])    Anxiety and depression    Depression    Fibromyalgia    GERD (gastroesophageal reflux disease)    Hiatal hernia    per patient, dx by GI    History of basal cell carcinoma excision    FACE, 1992  &  1996   History of hiatal hernia    History of thrush    Hyperlipidemia    Hypertension    Insomnia    Left breast mass    OCD (obsessive compulsive disorder)    Osteopenia    PONV (postoperative nausea and vomiting)    Psoriatic arthritis ([facility])    PVC (premature ventricular contraction)    Rheumatoid arthritis ([place])    Past Surgical History:  Procedure Laterality Date   BIOPSY  09/29/2019   Procedure: BIOPSY;  Surgeon: Ronnette Juniper, MD;  Location: [facility];  Service: Gastroenterology;;   BREAST EXCISIONAL BIOPSY Right    BREAST EXCISIONAL BIOPSY Left 05/04/2018   BREAST LUMPECTOMY WITH RADIOACTIVE SEED LOCALIZATION Left 05/04/2018   Procedure: LEFT BREAST LUMPECTOMY WITH RADIOACTIVE SEED LOCALIZATION AND LEFT BREAST NIPPLE BIOPSY;  Surgeon: Jovita Kussmaul, MD;  Location: [facility];  Service: General;  Laterality: Left;   BREAST SURGERY  05/25/1973   lumpectomy-- benign   BUNIONECTOMY  05/25/1989   CATARACT EXTRACTION W/ INTRAOCULAR  LENS  IMPLANT, BILATERAL  05/25/1998   CERVICAL FUSION  07/24/2011   C5 -- C7   CESAREAN SECTION  05/26/1983   COLONOSCOPY WITH PROPOFOL N/A 09/29/2019   Procedure: COLONOSCOPY WITH PROPOFOL;  Surgeon: Ronnette Juniper, MD;  Location: [facility];  Service: Gastroenterology;  Laterality: N/A;   DISTAL INTERPHALANGEAL JOINT FUSION Right 03/14/2015   Procedure: RIGHT LONG FINGER DISTAL INTERPHALANGEAL JOINT ARTHRODESIS;  Surgeon: Iran Planas, MD;  Location: [facility];  Service: Orthopedics;  Laterality: Right;   ESOPHAGOGASTRODUODENOSCOPY (EGD) WITH PROPOFOL N/A 09/29/2019   Procedure: ESOPHAGOGASTRODUODENOSCOPY (EGD) WITH PROPOFOL;  Surgeon: Ronnette Juniper, MD;  Location: [facility];  Service: Gastroenterology;  Laterality: N/A;   EYE SURGERY  05/25/1993   rk (laser surgery), semi cornea transplant, detacted retina,  fluid removal   HYSTEROSCOPY WITH D & C N/A 12/09/2020   Procedure: DILATATION AND CURETTAGE /HYSTEROSCOPY;  Surgeon: Drema Dallas, DO;  Location: [facility];  Service: Gynecology;  Laterality: N/A;   KNEE ARTHROSCOPY Left 03/03/2004   POLYPECTOMY  09/29/2019   Procedure: POLYPECTOMY;  Surgeon: Ronnette Juniper, MD;  Location: [facility];  Service: Gastroenterology;;   POSTERIOR VITRECTOMY RIGHT EYE AND LASER   10/27/1999   SHOULDER SURGERY Right  05/26/1995   SKIN BIOPSY  05/2022   lower stomach   SPINAL FIXATION SURGERY W/ IMPLANT  2013 rod #1//  2014  rod 2   S1 -- T10  (rod #1)//   S1 -- T4 (rod #2)   THORACIC FUSION  03/13/2013   REMOVAL HARDWARE/  BONE GRAFT FUSION T10//  REVISION OF RODS   TOTAL KNEE ARTHROPLASTY Left 12/14/2005   UVULOPALATOPHARYNGOPLASTY  04/26/2006   w/  TONSILLECTOMY/  TURBINATE REDUCTIONS/  BILATERAL ANTERIOR ETHMOIDECTOMY   Patient Active Problem List   Diagnosis Date Noted   Thickened endometrium 07/30/2022   History of adenomatous polyp of colon 07/30/2022   Bacterial conjunctivitis of right eye 04/30/2022    Contact dermatitis 04/30/2022   Lesion of liver 06/20/2021   C. difficile colitis 09/29/2019   Colitis 09/28/2019   Anxiety and depression 09/28/2019   DDD (degenerative disc disease), cervical s/p fusion 11/12/2016   H/O total knee replacement, left 05/06/2016   DDD lumbar spine status post fusion 05/06/2016   Psoriasis 05/05/2016   High risk medication use 05/05/2016   Cervical post-laminectomy syndrome 12/09/2015   Thoracic postlaminectomy syndrome 12/09/2015   Psoriatic arthritis ([place]) 08/23/2012   Osteopenia 08/23/2012   HLD (hyperlipidemia) 08/23/2012   RLS (restless legs syndrome) 08/23/2012   Insomnia 08/23/2012   Fibromyalgia syndrome 08/12/2012   Low back pain 08/12/2012   Syncope    PVC (premature ventricular contraction)    OCD (obsessive compulsive disorder)    GERD (gastroesophageal reflux disease)    Hypertension     REFERRING DIAG: M54.12 (ICD-10-CM) - Radiculopathy, cervical region  THERAPY DIAG:  Cervicalgia  Muscle weakness (generalized)  Abnormal posture  Pain in thoracic spine  Rationale for Evaluation and Treatment Rehabilitation  PERTINENT HISTORY:  HTN, PVCs, GERD, hx Cdiff, osteopenia, hx lumbar and cervical fusions (about 10 years ago), fibromyalgia, anxiety/depression, hx of skin cancer  PRECAUTIONS: multiple fusions, remote thoracic compression fractures, osteopenia  SUBJECTIVE:                                                                                                                                                                                      SUBJECTIVE STATEMENT:   Pt states she was having extra pain/soreness after last session, back to baseline today. Likes the resistance exercises. Also notes that she has noticed ability to walk in grocery store with better endurance, less pain  PAIN:  Are you having pain: 5/10 Location/description: aching in neck Best-worst over past week: 0-7/10 (0-9/10 on eval) Per eval -  -  aggravating factors: walking, laundry, kitchen/dishes, upper body - Easing factors: resting      OBJECTIVE: (objective measures completed at  initial evaluation unless otherwise dated)   DIAGNOSTIC FINDINGS:  Thoracic MRI Aug 2023 IMPRESSION: No acute abnormality or change compared to the prior exams.   Remote mild superior endplate compression fracture of T4 and severe compression fracture T10. The patient is status post decompression at T10 and thoracolumbar fusion without evidence of complication.   PATIENT SURVEYS:  FOTO 38 current, 49 predicted 08/18/22: 43%   COGNITION: Overall cognitive status: Within functional limits for tasks assessed   SENSATION: No reported neuro/sensory deficits, red flag questioning reassuring    POSTURE: forward head, kyphosis   PALPATION: Palpable tightness periscapular musculature BIL             CERVICAL ROM:    Active ROM A/PROM (deg) eval AROM 07/29/22 AROM  08/14/22  Flexion 75% pull    Extension 50% R sided pain     Right lateral flexion      Left lateral flexion      Right rotation 35 deg 40 55  Left rotation 50 deg  55   (Blank rows = not tested) (Key: WFL = within functional limits not formally assessed, * = concordant pain, s = stiffness/stretching sensation, NT = not tested)    UPPER EXTREMITY ROM:   A/PROM Right eval Left eval  Shoulder flexion 132 deg 129 deg  Shoulder abduction      Shoulder internal rotation      Shoulder external rotation      Elbow flexion      Elbow extension      Wrist flexion      Wrist extension       (Blank rows = not tested) (Key: WFL = within functional limits not formally assessed, * = concordant pain, s = stiffness/stretching sensation, NT = not tested)  Comments:     UPPER EXTREMITY MMT:   MMT Right eval Left eval  Shoulder flexion 4 4  Shoulder extension      Shoulder abduction 4 4  Shoulder extension      Shoulder internal rotation      Shoulder external rotation       Elbow flexion      Elbow extension      Grip strength      (Blank rows = not tested)  (Key: WFL = within functional limits not formally assessed, * = concordant pain, s = stiffness/stretching sensation, NT = not tested)  Comments:    CERVICAL SPECIAL TESTS:  Deferred given presence of hardware   FUNCTIONAL TESTS:  Overhead reach limited as above   TODAY'S TREATMENT:                                                                                               [facility] Adult PT Treatment:                                                DATE: 08/20/22 Therapeutic Exercise: Supine chin tuck 2x10 cues for form and posture  Supine cervical rotation  x8 cues for comfortable ROM  RTB row 2x10 cues for form and breath control, education on home setup RTB shoulder ext 2x8 cues for form and education for home setup Standing BIL shoulder flexion + thoracic ext at wall, 2x8 cues for comfortable ROM 2# shoulder shrugs 2x8 cues for breath control and emphasis on eccentric portion  HEP update + education  Manual Therapy: Supine: STM BIL paracervicals, UT, LS, suboccipital release   [facility] Adult PT Treatment:                                                DATE: 08/18/2022 Therapeutic Exercise: Supine chin tuck  1 x 15 holding 3 seconds Supine chin tuck head lift 1 x 10 holding 3 seconds Seated scap retract 2 x 10 while maintaining chin tuck Supine Cervical rotation AROM  x 5 each way Seated UT stretch 2 x 20 sec  Updated HEP: first rib mobilization and supine chin tuck head lifts  Manual Therapy: Supine; Suboccipital release + STM to pt tolerance, STM paracervicals and UT, LS Supine; Manual cervical traction  Supine; L/R first rib mobilization grade III   [facility] Adult PT Treatment:                                                DATE: 08/14/22 Therapeutic Exercise: Seated chin tuck 10 x 2  Seated scap retract 10 x 2  Standing green band row 10 x 2 Standing red band shoulder ext 10 x 2  Seated  AROM lateral flexion x 5 each way LS stretch 3 x 30 sec each Cervical rotation AROM  x 5 each way Cervical ext AROM x 5  UT stretch 3 breaths each way Dowel chest press + OH arc 2# 2x10 cues for form and pacing  Supine with head on blue deflated ball, cervical rotation AROM x 10, 3 sec head press x 10  Manual Therapy: Supine; Suboccipital release + STM to pt tolerance, STM paracervicals and UT, LS            PATIENT EDUCATION:  Education details: rationale for interventions, HEP update  Person educated: Patient Education method: Explanation, Demonstration, Tactile cues, Verbal cues, and Handouts Education comprehension: verbalized understanding, returned demonstration, verbal cues required, tactile cues required, and needs further education     HOME EXERCISE PROGRAM: Access Code: [email or web address] URL: [email or web address] Date: 08/20/2022 Prepared by: Enis Slipper  Exercises - Gentle Levator Scapulae Stretch  - 1 x daily - 7 x weekly - 2 sets - 3 reps - 30sec  hold - Supine Shoulder Horizontal Abduction with Resistance  - 1 x daily - 7 x weekly - 1-2 sets - 10 reps - Seated Cervical Retraction  - 1 x daily - 7 x weekly - 2 sets - 10 reps - 5 sec hold - Standing Shoulder Row with Anchored Resistance  - 1 x daily - 7 x weekly - 2 sets - 10 reps - Shoulder extension with resistance - Neutral  - 1 x daily - 7 x weekly - 2 sets - 10 reps   ASSESSMENT:   CLINICAL IMPRESSION:  08/20/2022 Pt arrives w/ 5/10 pain at present but does endorse increased pain/soreness after last session that persisted for  a couple days. Today focusing on gradual progression of activation/stability exercises. Also progressed resisted postural endurance training with good response. Good relief reported from manual at end of session. Updated HEP as above for reduced volume as pt reports some difficulty adhering to longer program, also discussed strategies for rotating exercises PRN. Pt departs with  improved symptoms, no adverse events. Recommend continuing along current POC in order to address relevant deficits and improve functional tolerance.   EVAL: On exam pt does demo postural deficits, reduced cervical/GH mobility. Tolerates HEP well without issue, cues/education provided for appropriate performance. Did advise on avoiding overexertion with HEP and monitoring symptoms as PT goes on given pt report of symptom behavior, pt verbalizes agreement/understanding with plan as outlined below. No adverse events or increases in pain reported today. Recommend skilled PT to address aforementioned deficits and maximize functional tolerance. Pt departs today's session in no acute distress, all voiced questions/concerns addressed appropriately from PT perspective.     OBJECTIVE IMPAIRMENTS: decreased activity tolerance, decreased endurance, decreased mobility, decreased ROM, decreased strength, hypomobility, impaired perceived functional ability, impaired UE functional use, improper body mechanics, postural dysfunction, and pain.    ACTIVITY LIMITATIONS: carrying, lifting, bending, standing, dressing, reach over head, hygiene/grooming, and locomotion level   PARTICIPATION LIMITATIONS: meal prep, cleaning, laundry, and driving   PERSONAL FACTORS: Age, Time since onset of injury/illness/exacerbation, and 3+ comorbidities: HTN, osteopenia, hx lumbar and cervical fusions, fibromyalgia, anxiety/depression  are also affecting patient's functional outcome.    REHAB POTENTIAL: Fair given chronicity and comorbidities   CLINICAL DECISION MAKING: Evolving/moderate complexity   EVALUATION COMPLEXITY: Moderate     GOALS: Goals reviewed with patient? No   SHORT TERM GOALS: Target date: 08/18/2022 Pt will demonstrate appropriate understanding and performance of initially prescribed HEP in order to facilitate improved independence with management of symptoms.  Baseline: HEP provided on eval Goal status: MET  08/18/2022   2. Pt will score greater than or equal to 44 on FOTO in order to demonstrate improved perception of function due to symptoms.            Baseline: 38 08/18/22: 43            Goal status: PARTIALLY MET 08/18/2022   LONG TERM GOALS: Target date: 09/15/2022   Pt will score 49 on FOTO in order to demonstrate improved perception of function due to symptoms. Baseline: 38 Goal status: INITIAL   2. Pt will demonstrate at least 45 degrees of active cervical rotation ROM BIL in order to demonstrate improved environmental awareness and safety with driving.  Baseline: see ROM chart above Goal status: INITIAL   3. Pt will demonstrate at least 4+/5 shoulder MMT for improved symmetry of UE strength and improved tolerance to functional movements.  Baseline: see MMT chart above Goal status: INITIAL    4. Pt will report/demonstrate ability to stand/walk for up to 36min with less than 2 point increase on NPS in order to demonstrate improved tolerance to household activities. Baseline: up to 9/10 pain with prolonged activities Goal status: INITIAL                         5. Pt will report/demonstrate independence with final prescribed HEP in order to facilitate improved self efficacy with management of symptoms.             Baseline: TBD            Goal status: INITIAL  PLAN:   PT FREQUENCY: 2x/week   PT DURATION: 8 weeks   PLANNED INTERVENTIONS: Therapeutic exercises, Therapeutic activity, Neuromuscular re-education, Balance training, Gait training, Patient/Family education, Self Care, Vestibular training, Dry Needling, Cryotherapy, Moist heat, Taping, Manual therapy, and Re-evaluation   PLAN FOR NEXT SESSION: gentle postural stability/strengthening, cervical mobility as appropriate. Consider STM to reduce tightness PRN.  Consider thoracic ext mobility.      Leeroy Cha PT, DPT 08/20/2022 5:18 PM

## 2022-08-20 ENCOUNTER — Ambulatory Visit: Payer: Medicare Other | Admitting: Physical Therapy

## 2022-08-20 ENCOUNTER — Encounter: Payer: Self-pay | Admitting: Physical Therapy

## 2022-08-20 DIAGNOSIS — M542 Cervicalgia: Secondary | ICD-10-CM | POA: Diagnosis not present

## 2022-08-20 DIAGNOSIS — M6281 Muscle weakness (generalized): Secondary | ICD-10-CM

## 2022-08-20 DIAGNOSIS — R293 Abnormal posture: Secondary | ICD-10-CM

## 2022-08-20 DIAGNOSIS — M546 Pain in thoracic spine: Secondary | ICD-10-CM

## 2022-08-22 ENCOUNTER — Other Ambulatory Visit: Payer: Self-pay | Admitting: Family Medicine

## 2022-08-24 NOTE — Telephone Encounter (Signed)
Requested Prescriptions  Pending Prescriptions Disp Refills   ezetimibe (ZETIA) 10 MG tablet [Pharmacy Med Name: EZETIMIBE 10 MG TABLET] 90 tablet 2    Sig: TAKE 1 TABLET BY MOUTH EVERY DAY     Cardiovascular:  Antilipid - Sterol Transport Inhibitors Failed - 08/22/2022  8:52 AM      Failed - ALT in normal range and within 360 days    ALT  Date Value Ref Range Status  07/30/2022 5 (L) 6 - 29 U/L Final         Failed - Lipid Panel in normal range within the last 12 months    Cholesterol  Date Value Ref Range Status  06/04/2021 191 <200 mg/dL Final   LDL Cholesterol (Calc)  Date Value Ref Range Status  06/04/2021 108 (H) mg/dL (calc) Final    Comment:    Reference range: <100 . Desirable range <100 mg/dL for primary prevention;   <70 mg/dL for patients with CHD or diabetic patients  with > or = 2 CHD risk factors. Marland Kitchen LDL-C is now calculated using the Martin-Hopkins  calculation, which is a validated novel method providing  better accuracy than the Friedewald equation in the  estimation of LDL-C.  Cresenciano Genre et al. Annamaria Helling. [id number]): 2061-2068  (http://education.[facility].com/faq/FAQ164)    HDL  Date Value Ref Range Status  06/04/2021 58 > OR = 50 mg/dL Final   Triglycerides  Date Value Ref Range Status  06/04/2021 133 <150 mg/dL Final         Passed - AST in normal range and within 360 days    AST  Date Value Ref Range Status  07/30/2022 10 10 - 35 U/L Final         Passed - Patient is not pregnant      Passed - Valid encounter within last 12 months    Recent Outpatient Visits           10 months ago Hyponatremia   [place] Pickard, Cammie Mcgee, MD   1 year ago [place] Pickard, Cammie Mcgee, MD   1 year ago [place] Pickard, Cammie Mcgee, MD   1 year ago Anemia, unspecified type   [place] Susy Frizzle, MD   1 year ago Acute non-recurrent  pansinusitis   [place] Eulogio Bear, NP       Future Appointments             In 2 months [place] Blinda [place] [facility]

## 2022-08-25 ENCOUNTER — Encounter: Payer: Self-pay | Admitting: Physical Therapy

## 2022-08-25 ENCOUNTER — Ambulatory Visit: Payer: Medicare Other | Attending: Physician Assistant | Admitting: Physical Therapy

## 2022-08-25 DIAGNOSIS — M542 Cervicalgia: Secondary | ICD-10-CM

## 2022-08-25 DIAGNOSIS — M6281 Muscle weakness (generalized): Secondary | ICD-10-CM | POA: Diagnosis present

## 2022-08-25 DIAGNOSIS — R293 Abnormal posture: Secondary | ICD-10-CM | POA: Insufficient documentation

## 2022-08-25 DIAGNOSIS — M546 Pain in thoracic spine: Secondary | ICD-10-CM | POA: Insufficient documentation

## 2022-08-25 NOTE — Therapy (Signed)
OUTPATIENT PHYSICAL THERAPY TREATMENT NOTE   Patient Name: Janet Peterson MRN: [id number] DOB:[birth year], 72 y.o., female Today's Date: 08/25/2022  PCP: Susy Frizzle, MD   REFERRING PROVIDER: Loanne Drilling, PA-C    END OF SESSION:   PT End of Session - 08/25/22 1509     Visit Number 9    Number of Visits 17    Date for PT Re-Evaluation 09/15/22    Authorization Type [employer] MCR    Progress Note Due on Visit 10    PT Start Time 1505    PT Stop Time [id number]    PT Time Calculation (min) 44 min    Activity Tolerance Patient tolerated treatment well;No increased pain                   Past Medical History:  Diagnosis Date   Ankylosing spondylitis    Anxiety and depression    Depression    Fibromyalgia    GERD (gastroesophageal reflux disease)    Hiatal hernia    per patient, dx by GI    History of basal cell carcinoma excision    FACE, 1992  &  1996   History of hiatal hernia    History of thrush    Hyperlipidemia    Hypertension    Insomnia    Left breast mass    OCD (obsessive compulsive disorder)    Osteopenia    PONV (postoperative nausea and vomiting)    Psoriatic arthritis    PVC (premature ventricular contraction)    Rheumatoid arthritis    Past Surgical History:  Procedure Laterality Date   BIOPSY  09/29/2019   Procedure: BIOPSY;  Surgeon: Ronnette Juniper, MD;  Location: [facility];  Service: Gastroenterology;;   BREAST EXCISIONAL BIOPSY Right    BREAST EXCISIONAL BIOPSY Left 05/04/2018   BREAST LUMPECTOMY WITH RADIOACTIVE SEED LOCALIZATION Left 05/04/2018   Procedure: LEFT BREAST LUMPECTOMY WITH RADIOACTIVE SEED LOCALIZATION AND LEFT BREAST NIPPLE BIOPSY;  Surgeon: Jovita Kussmaul, MD;  Location: [facility];  Service: General;  Laterality: Left;   BREAST SURGERY  05/25/1973   lumpectomy-- benign   BUNIONECTOMY  05/25/1989   CATARACT EXTRACTION W/ INTRAOCULAR LENS  IMPLANT, BILATERAL  05/25/1998   CERVICAL FUSION  07/24/2011   C5 --  C7   CESAREAN SECTION  05/26/1983   COLONOSCOPY WITH PROPOFOL N/A 09/29/2019   Procedure: COLONOSCOPY WITH PROPOFOL;  Surgeon: Ronnette Juniper, MD;  Location: [facility];  Service: Gastroenterology;  Laterality: N/A;   DISTAL INTERPHALANGEAL JOINT FUSION Right 03/14/2015   Procedure: RIGHT LONG FINGER DISTAL INTERPHALANGEAL JOINT ARTHRODESIS;  Surgeon: Iran Planas, MD;  Location: [facility];  Service: Orthopedics;  Laterality: Right;   ESOPHAGOGASTRODUODENOSCOPY (EGD) WITH PROPOFOL N/A 09/29/2019   Procedure: ESOPHAGOGASTRODUODENOSCOPY (EGD) WITH PROPOFOL;  Surgeon: Ronnette Juniper, MD;  Location: [facility];  Service: Gastroenterology;  Laterality: N/A;   EYE SURGERY  05/25/1993   rk (laser surgery), semi cornea transplant, detacted retina,  fluid removal   HYSTEROSCOPY WITH D & C N/A 12/09/2020   Procedure: DILATATION AND CURETTAGE /HYSTEROSCOPY;  Surgeon: Drema Dallas, DO;  Location: [facility];  Service: Gynecology;  Laterality: N/A;   KNEE ARTHROSCOPY Left 03/03/2004   POLYPECTOMY  09/29/2019   Procedure: POLYPECTOMY;  Surgeon: Ronnette Juniper, MD;  Location: [facility];  Service: Gastroenterology;;   POSTERIOR VITRECTOMY RIGHT EYE AND LASER   10/27/1999   SHOULDER SURGERY Right 05/26/1995   SKIN BIOPSY  05/2022   lower stomach  SPINAL FIXATION SURGERY W/ IMPLANT  2013 rod #1//  2014  rod 2   S1 -- T10  (rod #1)//   S1 -- T4 (rod #2)   THORACIC FUSION  03/13/2013   REMOVAL HARDWARE/  BONE GRAFT FUSION T10//  REVISION OF RODS   TOTAL KNEE ARTHROPLASTY Left 12/14/2005   UVULOPALATOPHARYNGOPLASTY  04/26/2006   w/  TONSILLECTOMY/  TURBINATE REDUCTIONS/  BILATERAL ANTERIOR ETHMOIDECTOMY   Patient Active Problem List   Diagnosis Date Noted   Thickened endometrium 07/30/2022   History of adenomatous polyp of colon 07/30/2022   Bacterial conjunctivitis of right eye 04/30/2022   Contact dermatitis 04/30/2022   Lesion of liver 06/20/2021   C. difficile  colitis 09/29/2019   Colitis 09/28/2019   Anxiety and depression 09/28/2019   DDD (degenerative disc disease), cervical s/p fusion 11/12/2016   H/O total knee replacement, left 05/06/2016   DDD lumbar spine status post fusion 05/06/2016   Psoriasis 05/05/2016   High risk medication use 05/05/2016   Cervical post-laminectomy syndrome 12/09/2015   Thoracic postlaminectomy syndrome 12/09/2015   Psoriatic arthritis ([place]) 08/23/2012   Osteopenia 08/23/2012   HLD (hyperlipidemia) 08/23/2012   RLS (restless legs syndrome) 08/23/2012   Insomnia 08/23/2012   Fibromyalgia syndrome 08/12/2012   Low back pain 08/12/2012   Syncope    PVC (premature ventricular contraction)    OCD (obsessive compulsive disorder)    GERD (gastroesophageal reflux disease)    Hypertension     REFERRING DIAG: M54.12 (ICD-10-CM) - Radiculopathy, cervical region  THERAPY DIAG:  Cervicalgia  Muscle weakness (generalized)  Abnormal posture  Rationale for Evaluation and Treatment Rehabilitation  PERTINENT HISTORY:  HTN, PVCs, GERD, hx Cdiff, osteopenia, hx lumbar and cervical fusions (about 10 years ago), fibromyalgia, anxiety/depression, hx of skin cancer  PRECAUTIONS: multiple fusions, remote thoracic compression fractures, osteopenia  SUBJECTIVE:                                                                                                                                                                                      SUBJECTIVE STATEMENT:   " I was sore after the previous session but today I am doing better, I did use the tennis balls before coming, and took some meds also."  PAIN:  Are you having pain: 5/10 Location/description: aching in neck Best-worst over past week: 0-7/10 (0-9/10 on eval) Per eval -  - aggravating factors: walking, laundry, kitchen/dishes, upper body - Easing factors: resting      OBJECTIVE: (objective measures completed at initial evaluation unless otherwise  dated)   DIAGNOSTIC FINDINGS:  Thoracic MRI Aug 2023 IMPRESSION: No acute abnormality or change compared to the prior exams.  Remote mild superior endplate compression fracture of T4 and severe compression fracture T10. The patient is status post decompression at T10 and thoracolumbar fusion without evidence of complication.   PATIENT SURVEYS:  FOTO 38 current, 49 predicted 08/18/22: 43%   COGNITION: Overall cognitive status: Within functional limits for tasks assessed   SENSATION: No reported neuro/sensory deficits, red flag questioning reassuring    POSTURE: forward head, kyphosis   PALPATION: Palpable tightness periscapular musculature BIL             CERVICAL ROM:    Active ROM A/PROM (deg) eval AROM 07/29/22 AROM  08/14/22  Flexion 75% pull    Extension 50% R sided pain     Right lateral flexion      Left lateral flexion      Right rotation 35 deg 40 55  Left rotation 50 deg  55   (Blank rows = not tested) (Key: WFL = within functional limits not formally assessed, * = concordant pain, s = stiffness/stretching sensation, NT = not tested)    UPPER EXTREMITY ROM:   A/PROM Right eval Left eval  Shoulder flexion 132 deg 129 deg  Shoulder abduction      Shoulder internal rotation      Shoulder external rotation      Elbow flexion      Elbow extension      Wrist flexion      Wrist extension       (Blank rows = not tested) (Key: WFL = within functional limits not formally assessed, * = concordant pain, s = stiffness/stretching sensation, NT = not tested)  Comments:     UPPER EXTREMITY MMT:   MMT Right eval Left eval  Shoulder flexion 4 4  Shoulder extension      Shoulder abduction 4 4  Shoulder extension      Shoulder internal rotation      Shoulder external rotation      Elbow flexion      Elbow extension      Grip strength      (Blank rows = not tested)  (Key: WFL = within functional limits not formally assessed, * = concordant pain, s =  stiffness/stretching sensation, NT = not tested)  Comments:    CERVICAL SPECIAL TESTS:  Deferred given presence of hardware   FUNCTIONAL TESTS:  Overhead reach limited as above   TODAY'S TREATMENT:                                                                                               [facility] Adult PT Treatment:                                                DATE: 08/25/22 Therapeutic Exercise: Supine chin tuck 1 x 10 holding 10 seconds Nu-step UE/LE L5 x 5 min Standing cervical isometrics ball against the wall 1 x 10 holding 10 sec each way. Standing scapular retraction  Seated thoracic rotation hugging black physioball  2 x 10 Seated thoracic extension with gentle manual over pressure, using black physioball 2 x 10 Scapular retraction with ER 2 x 10 with RTB Seated PNF D2  bilwith sustained chin tuck 1 x 10 ea with YTB Manual Therapy: Sub-occipital release MTPR along the splenius capitus   [facility] Adult PT Treatment:                                                DATE: 08/20/22 Therapeutic Exercise: Supine chin tuck 2x10 cues for form and posture  Supine cervical rotation x8 cues for comfortable ROM  RTB row 2x10 cues for form and breath control, education on home setup RTB shoulder ext 2x8 cues for form and education for home setup Standing BIL shoulder flexion + thoracic ext at wall, 2x8 cues for comfortable ROM 2# shoulder shrugs 2x8 cues for breath control and emphasis on eccentric portion  HEP update + education  Manual Therapy: Supine: STM BIL paracervicals, UT, LS, suboccipital release   [facility] Adult PT Treatment:                                                DATE: 08/18/2022 Therapeutic Exercise: Supine chin tuck  1 x 15 holding 3 seconds Supine chin tuck head lift 1 x 10 holding 3 seconds Seated scap retract 2 x 10 while maintaining chin tuck Supine Cervical rotation AROM  x 5 each way Seated UT stretch 2 x 20 sec  Updated HEP: first rib mobilization and supine  chin tuck head lifts  Manual Therapy: Supine; Suboccipital release + STM to pt tolerance, STM paracervicals and UT, LS Supine; Manual cervical traction  Supine; L/R first rib mobilization grade III           PATIENT EDUCATION:  Education details: rationale for interventions, HEP update  Person educated: Patient Education method: Explanation, Demonstration, Tactile cues, Verbal cues, and Handouts Education comprehension: verbalized understanding, returned demonstration, verbal cues required, tactile cues required, and needs further education     HOME EXERCISE PROGRAM: Access Code: [email or web address] URL: [email or web address] Date: 08/20/2022 Prepared by: Enis Slipper  Exercises - Gentle Levator Scapulae Stretch  - 1 x daily - 7 x weekly - 2 sets - 3 reps - 30sec  hold - Supine Shoulder Horizontal Abduction with Resistance  - 1 x daily - 7 x weekly - 1-2 sets - 10 reps - Seated Cervical Retraction  - 1 x daily - 7 x weekly - 2 sets - 10 reps - 5 sec hold - Standing Shoulder Row with Anchored Resistance  - 1 x daily - 7 x weekly - 2 sets - 10 reps - Shoulder extension with resistance - Neutral  - 1 x daily - 7 x weekly - 2 sets - 10 reps   ASSESSMENT:   CLINICAL IMPRESSION:  08/25/2022 Mrs Adwell arrives to PT today reporting 5/10 pain but does note continued improvement in cervical strength/ endurance. Continued STW along the sub-occipitals followed with posterior chain activation which she did well with. Focused keeping head in neutral chin tuck position during exercises but did required posterior support to maintain her seated positioning. Plan to review HEP and updated next session for discharge.   EVAL: On exam pt does  demo postural deficits, reduced cervical/GH mobility. Tolerates HEP well without issue, cues/education provided for appropriate performance. Did advise on avoiding overexertion with HEP and monitoring symptoms as PT goes on given pt report of symptom behavior, pt  verbalizes agreement/understanding with plan as outlined below. No adverse events or increases in pain reported today. Recommend skilled PT to address aforementioned deficits and maximize functional tolerance. Pt departs today's session in no acute distress, all voiced questions/concerns addressed appropriately from PT perspective.     OBJECTIVE IMPAIRMENTS: decreased activity tolerance, decreased endurance, decreased mobility, decreased ROM, decreased strength, hypomobility, impaired perceived functional ability, impaired UE functional use, improper body mechanics, postural dysfunction, and pain.    ACTIVITY LIMITATIONS: carrying, lifting, bending, standing, dressing, reach over head, hygiene/grooming, and locomotion level   PARTICIPATION LIMITATIONS: meal prep, cleaning, laundry, and driving   PERSONAL FACTORS: Age, Time since onset of injury/illness/exacerbation, and 3+ comorbidities: HTN, osteopenia, hx lumbar and cervical fusions, fibromyalgia, anxiety/depression  are also affecting patient's functional outcome.    REHAB POTENTIAL: Fair given chronicity and comorbidities   CLINICAL DECISION MAKING: Evolving/moderate complexity   EVALUATION COMPLEXITY: Moderate     GOALS: Goals reviewed with patient? No   SHORT TERM GOALS: Target date: 08/18/2022 Pt will demonstrate appropriate understanding and performance of initially prescribed HEP in order to facilitate improved independence with management of symptoms.  Baseline: HEP provided on eval Goal status: MET 08/18/2022   2. Pt will score greater than or equal to 44 on FOTO in order to demonstrate improved perception of function due to symptoms.            Baseline: 38 08/18/22: 43            Goal status: PARTIALLY MET 08/18/2022   LONG TERM GOALS: Target date: 09/15/2022   Pt will score 49 on FOTO in order to demonstrate improved perception of function due to symptoms. Baseline: 38 Goal status: INITIAL   2. Pt will demonstrate at  least 45 degrees of active cervical rotation ROM BIL in order to demonstrate improved environmental awareness and safety with driving.  Baseline: see ROM chart above Goal status: INITIAL   3. Pt will demonstrate at least 4+/5 shoulder MMT for improved symmetry of UE strength and improved tolerance to functional movements.  Baseline: see MMT chart above Goal status: INITIAL    4. Pt will report/demonstrate ability to stand/walk for up to 21min with less than 2 point increase on NPS in order to demonstrate improved tolerance to household activities. Baseline: up to 9/10 pain with prolonged activities Goal status: INITIAL                         5. Pt will report/demonstrate independence with final prescribed HEP in order to facilitate improved self efficacy with management of symptoms.             Baseline: TBD            Goal status: INITIAL                                     PLAN:   PT FREQUENCY: 2x/week   PT DURATION: 8 weeks   PLANNED INTERVENTIONS: Therapeutic exercises, Therapeutic activity, Neuromuscular re-education, Balance training, Gait training, Patient/Family education, Self Care, Vestibular training, Dry Needling, Cryotherapy, Moist heat, Taping, Manual therapy, and Re-evaluation   PLAN FOR NEXT SESSION: gentle postural stability/strengthening, cervical  mobility as appropriate. Consider STM to reduce tightness PRN.  Consider thoracic ext mobility.      Mariena Meares PT, DPT, LAT, ATC  08/25/22  3:52 PM

## 2022-08-27 ENCOUNTER — Telehealth: Payer: Self-pay | Admitting: Physical Therapy

## 2022-08-27 ENCOUNTER — Ambulatory Visit: Payer: Medicare Other | Admitting: Physical Therapy

## 2022-08-27 ENCOUNTER — Ambulatory Visit: Payer: Medicare Other

## 2022-08-27 DIAGNOSIS — M542 Cervicalgia: Secondary | ICD-10-CM

## 2022-08-27 DIAGNOSIS — M6281 Muscle weakness (generalized): Secondary | ICD-10-CM

## 2022-08-27 DIAGNOSIS — M546 Pain in thoracic spine: Secondary | ICD-10-CM

## 2022-08-27 DIAGNOSIS — R293 Abnormal posture: Secondary | ICD-10-CM

## 2022-08-27 NOTE — Telephone Encounter (Signed)
Called primary phone number and LVM regarding missed appointment today, and wanted to check in on her. I hope all is okay, and provided the clinic number for her to call back.  Tom Macpherson PT, DPT, LAT, ATC  08/27/22  2:17 PM

## 2022-08-27 NOTE — Therapy (Signed)
OUTPATIENT PHYSICAL THERAPY TREATMENT NOTE   Patient Name: Janet Peterson MRN: [id number] DOB:11-Nov-1950, 72 y.o., female Today's Date: 08/27/2022  PCP: Susy Frizzle, MD   REFERRING PROVIDER: Loanne Drilling, PA-C    END OF SESSION:   PT End of Session - 08/27/22 1505     Visit Number 10    Authorization Type [employer] MCR    PT Start Time 1501    PT Stop Time [id number]    PT Time Calculation (min) 40 min    Activity Tolerance Patient tolerated treatment well;No increased pain    Behavior During Therapy WFL for tasks assessed/performed                    Past Medical History:  Diagnosis Date   Ankylosing spondylitis    Anxiety and depression    Depression    Fibromyalgia    GERD (gastroesophageal reflux disease)    Hiatal hernia    per patient, dx by GI    History of basal cell carcinoma excision    FACE, 1992  &  1996   History of hiatal hernia    History of thrush    Hyperlipidemia    Hypertension    Insomnia    Left breast mass    OCD (obsessive compulsive disorder)    Osteopenia    PONV (postoperative nausea and vomiting)    Psoriatic arthritis    PVC (premature ventricular contraction)    Rheumatoid arthritis    Past Surgical History:  Procedure Laterality Date   BIOPSY  09/29/2019   Procedure: BIOPSY;  Surgeon: Ronnette Juniper, MD;  Location: [facility];  Service: Gastroenterology;;   BREAST EXCISIONAL BIOPSY Right    BREAST EXCISIONAL BIOPSY Left 05/04/2018   BREAST LUMPECTOMY WITH RADIOACTIVE SEED LOCALIZATION Left 05/04/2018   Procedure: LEFT BREAST LUMPECTOMY WITH RADIOACTIVE SEED LOCALIZATION AND LEFT BREAST NIPPLE BIOPSY;  Surgeon: Jovita Kussmaul, MD;  Location: [facility];  Service: General;  Laterality: Left;   BREAST SURGERY  05/25/1973   lumpectomy-- benign   BUNIONECTOMY  05/25/1989   CATARACT EXTRACTION W/ INTRAOCULAR LENS  IMPLANT, BILATERAL  05/25/1998   CERVICAL FUSION  07/24/2011   C5 -- C7   CESAREAN SECTION  05/26/1983    COLONOSCOPY WITH PROPOFOL N/A 09/29/2019   Procedure: COLONOSCOPY WITH PROPOFOL;  Surgeon: Ronnette Juniper, MD;  Location: [facility];  Service: Gastroenterology;  Laterality: N/A;   DISTAL INTERPHALANGEAL JOINT FUSION Right 03/14/2015   Procedure: RIGHT LONG FINGER DISTAL INTERPHALANGEAL JOINT ARTHRODESIS;  Surgeon: Iran Planas, MD;  Location: [place];  Service: Orthopedics;  Laterality: Right;   ESOPHAGOGASTRODUODENOSCOPY (EGD) WITH PROPOFOL N/A 09/29/2019   Procedure: ESOPHAGOGASTRODUODENOSCOPY (EGD) WITH PROPOFOL;  Surgeon: Ronnette Juniper, MD;  Location: [facility];  Service: Gastroenterology;  Laterality: N/A;   EYE SURGERY  05/25/1993   rk (laser surgery), semi cornea transplant, detacted retina,  fluid removal   HYSTEROSCOPY WITH D & C N/A 12/09/2020   Procedure: DILATATION AND CURETTAGE /HYSTEROSCOPY;  Surgeon: Drema Dallas, DO;  Location: [place];  Service: Gynecology;  Laterality: N/A;   KNEE ARTHROSCOPY Left 03/03/2004   POLYPECTOMY  09/29/2019   Procedure: POLYPECTOMY;  Surgeon: Ronnette Juniper, MD;  Location: [facility];  Service: Gastroenterology;;   POSTERIOR VITRECTOMY RIGHT EYE AND LASER   10/27/1999   SHOULDER SURGERY Right 05/26/1995   SKIN BIOPSY  05/2022   lower stomach   SPINAL FIXATION SURGERY W/ IMPLANT  2013 rod #1//  2014  rod 2   S1 -- T10  (rod #1)//   S1 -- T4 (rod #2)   THORACIC FUSION  03/13/2013   REMOVAL HARDWARE/  BONE GRAFT FUSION T10//  REVISION OF RODS   TOTAL KNEE ARTHROPLASTY Left 12/14/2005   UVULOPALATOPHARYNGOPLASTY  04/26/2006   w/  TONSILLECTOMY/  TURBINATE REDUCTIONS/  BILATERAL ANTERIOR ETHMOIDECTOMY   Patient Active Problem List   Diagnosis Date Noted   Thickened endometrium 07/30/2022   History of adenomatous polyp of colon 07/30/2022   Bacterial conjunctivitis of right eye 04/30/2022   Contact dermatitis 04/30/2022   Lesion of liver 06/20/2021   C. difficile colitis 09/29/2019   Colitis 09/28/2019    Anxiety and depression 09/28/2019   DDD (degenerative disc disease), cervical s/p fusion 11/12/2016   H/O total knee replacement, left 05/06/2016   DDD lumbar spine status post fusion 05/06/2016   Psoriasis 05/05/2016   High risk medication use 05/05/2016   Cervical post-laminectomy syndrome 12/09/2015   Thoracic postlaminectomy syndrome 12/09/2015   Psoriatic arthritis ([place]) 08/23/2012   Osteopenia 08/23/2012   HLD (hyperlipidemia) 08/23/2012   RLS (restless legs syndrome) 08/23/2012   Insomnia 08/23/2012   Fibromyalgia syndrome 08/12/2012   Low back pain 08/12/2012   Syncope    PVC (premature ventricular contraction)    OCD (obsessive compulsive disorder)    GERD (gastroesophageal reflux disease)    Hypertension     REFERRING DIAG: M54.12 (ICD-10-CM) - Radiculopathy, cervical region  THERAPY DIAG:  Cervicalgia  Muscle weakness (generalized)  Abnormal posture  Pain in thoracic spine  Rationale for Evaluation and Treatment Rehabilitation  PERTINENT HISTORY:  HTN, PVCs, GERD, hx Cdiff, osteopenia, hx lumbar and cervical fusions (about 10 years ago), fibromyalgia, anxiety/depression, hx of skin cancer  PRECAUTIONS: multiple fusions, remote thoracic compression fractures, osteopenia  SUBJECTIVE:                                                                                                                                                                                      SUBJECTIVE STATEMENT:   Patient reports that she feels her neck is doing much better. "It gets tired at night, but it's okay during the day."   PAIN: Are you having pain: 0/10 "I took ibuprofen before I came."  Location/description: aching in neck Best-worst over past week: 0-8/10 (0-9/10 on eval) Per eval -  - aggravating factors: prolonged cervical flexion to do laundry, cleaning, washing dishes  - Easing factors: resting, pain medicine/NSAIDs, self trigger point release      OBJECTIVE:  (objective measures completed at initial evaluation unless otherwise dated)   DIAGNOSTIC FINDINGS:  Thoracic MRI Aug 2023 IMPRESSION: No acute abnormality or change  compared to the prior exams.   Remote mild superior endplate compression fracture of T4 and severe compression fracture T10. The patient is status post decompression at T10 and thoracolumbar fusion without evidence of complication.   PATIENT SURVEYS:  FOTO 38 current, 49 predicted 08/18/22: 43% 08/27/22: 41%   COGNITION: Overall cognitive status: Within functional limits for tasks assessed   SENSATION: No reported neuro/sensory deficits, red flag questioning reassuring    POSTURE: forward head, kyphosis   PALPATION: Palpable tightness periscapular musculature BIL             CERVICAL ROM:    Active ROM A/PROM (deg) eval AROM 07/29/22 AROM  08/14/22 AROM 08/27/22  Flexion 75% pull     Extension 50% R sided pain    60% pull  Right lateral flexion       Left lateral flexion       Right rotation 35 deg 40 55   Left rotation 50 deg  55    (Blank rows = not tested) (Key: WFL = within functional limits not formally assessed, * = concordant pain, s = stiffness/stretching sensation, NT = not tested)    UPPER EXTREMITY ROM:   A/PROM Right eval Left eval 4/424  Shoulder flexion 132 deg 129 deg [facility] bilaterally   Shoulder abduction       Shoulder internal rotation       Shoulder external rotation       Elbow flexion       Elbow extension       Wrist flexion       Wrist extension        (Blank rows = not tested) (Key: WFL = within functional limits not formally assessed, * = concordant pain, s = stiffness/stretching sensation, NT = not tested)  Comments:     UPPER EXTREMITY MMT:   MMT Right eval Left eval Right 08/27/22 Left 08/27/22  Shoulder flexion 4 4 4 4   Shoulder extension        Shoulder abduction 4 4 4 4   Shoulder extension        Shoulder internal rotation        Shoulder external rotation         Elbow flexion        Elbow extension        Grip strength        (Blank rows = not tested)  (Key: WFL = within functional limits not formally assessed, * = concordant pain, s = stiffness/stretching sensation, NT = not tested)  Comments:    CERVICAL SPECIAL TESTS:  Deferred given presence of hardware   FUNCTIONAL TESTS:  Overhead reach limited as above  [facility] Adult PT Treatment:                                                DATE: 08/27/2022  Therapeutic Exercise: Standing cervical isometrics ball against the wall 1 x 10 holding 10 sec each way. Seated thoracic rotation with arms crossed, 2 x 10  Seated thoracic extension with horizontal towel roll, 2 x 10   Therapeutic Activity: Updated HEP and discussed options for modifications and progressions as necessary.  Discussed assessment of progression towards goals, and updated objective measures.     TODAY'S TREATMENT:  [facility] Adult PT Treatment:                                                DATE: 08/25/22 Therapeutic Exercise: Supine chin tuck 1 x 10 holding 10 seconds Nu-step UE/LE L5 x 5 min Standing cervical isometrics ball against the wall 1 x 10 holding 10 sec each way. Standing scapular retraction  Seated thoracic rotation hugging black physioball 2 x 10 Seated thoracic extension with gentle manual over pressure, using black physioball 2 x 10 Scapular retraction with ER 2 x 10 with RTB Seated PNF D2  bilwith sustained chin tuck 1 x 10 ea with YTB Manual Therapy: Sub-occipital release MTPR along the splenius capitus   [facility] Adult PT Treatment:                                                DATE: 08/20/22 Therapeutic Exercise: Supine chin tuck 2x10 cues for form and posture  Supine cervical rotation x8 cues for comfortable ROM  RTB row 2x10 cues for form and breath control, education on home setup RTB shoulder ext 2x8 cues for form and  education for home setup Standing BIL shoulder flexion + thoracic ext at wall, 2x8 cues for comfortable ROM 2# shoulder shrugs 2x8 cues for breath control and emphasis on eccentric portion  HEP update + education  Manual Therapy: Supine: STM BIL paracervicals, UT, LS, suboccipital release          PATIENT EDUCATION:  Education details: rationale for interventions, HEP update  Person educated: Patient Education method: Explanation, Demonstration, Tactile cues, Verbal cues, and Handouts Education comprehension: verbalized understanding, returned demonstration, verbal cues required, tactile cues required, and needs further education     HOME EXERCISE PROGRAM: Access Code: [email or web address] URL: [email or web address] Date: 08/27/2022 Prepared by: Shelby Dubin  Program Notes Switch to GREEN resistance band, when the red band no longer feels challenging  Exercises - Gentle Levator Scapulae Stretch  - 1 x daily - 7 x weekly - 2 sets - 3 reps - 30sec  hold - Seated Cervical Retraction  - 1 x daily - 7 x weekly - 2 sets - 10 reps - 5 sec hold - Supine Deep Neck Flexor Training - Repetitions  - 1 x daily - 7 x weekly - 5 sets - 5-10 sec hold - Seated Shoulder Horizontal Abduction with Resistance  - 1 x daily - 7 x weekly - 2 sets - 10 reps - Standing Shoulder Row with Anchored Resistance  - 1 x daily - 7 x weekly - 2 sets - 10 reps - Shoulder extension with resistance - Neutral  - 1 x daily - 7 x weekly - 2 sets - 10 reps - Seated Shoulder Diagonal with Resistance  - 1 x daily - 7 x weekly - 3 sets - 10 reps - Seated Thoracic Lumbar Extension  - 1 x daily - 7 x weekly - 3 sets - 10 reps - 3 sec hold - Seated Thoracic Flexion and Rotation with Arms Crossed  - 1 x daily - 7 x weekly - 3 sets - 10 reps - Isometric Cervical Sidebending at wall with Ball  - 1 x daily - 7 x weekly - 2 sets -  10 reps - 3 sec hold - Isometric Cervical Extension at Wall with Ball  - 1 x daily - 7 x weekly - 2  sets - 10 reps - 3 sec hold - Isometric Cervical Flexion at Wall with Ball  - 1 x daily - 7 x weekly - 2 sets - 10 reps - 3 sec hold   ASSESSMENT:   CLINICAL IMPRESSION:  08/27/2022 Janet Peterson is reporting no pain at start of today's session. She is reporting readiness to be discharged from PT today with updated HEP handout for ongoing progression towards goals. She is demonstrating improved cervical ROM with no pain at accessible end-range. She continues to have decreased UE strength bilaterally and will continue to benefit from adherence to HEP for ongoing functional strengthening. However, patient has made adaptations, via hired assistance for most strenuous household activities. She does not feel that her overall quality of life is being significantly impacted by her remaining strength and mobility deficits.   EVAL: On exam pt does demo postural deficits, reduced cervical/GH mobility. Tolerates HEP well without issue, cues/education provided for appropriate performance. Did advise on avoiding overexertion with HEP and monitoring symptoms as PT goes on given pt report of symptom behavior, pt verbalizes agreement/understanding with plan as outlined below. No adverse events or increases in pain reported today. Recommend skilled PT to address aforementioned deficits and maximize functional tolerance. Pt departs today's session in no acute distress, all voiced questions/concerns addressed appropriately from PT perspective.     OBJECTIVE IMPAIRMENTS: decreased activity tolerance, decreased endurance, decreased mobility, decreased ROM, decreased strength, hypomobility, impaired perceived functional ability, impaired UE functional use, improper body mechanics, postural dysfunction, and pain.    ACTIVITY LIMITATIONS: carrying, lifting, bending, standing, dressing, reach over head, hygiene/grooming, and locomotion level   PARTICIPATION LIMITATIONS: meal prep, cleaning, laundry, and driving   PERSONAL FACTORS:  Age, Time since onset of injury/illness/exacerbation, and 3+ comorbidities: HTN, osteopenia, hx lumbar and cervical fusions, fibromyalgia, anxiety/depression  are also affecting patient's functional outcome.    REHAB POTENTIAL: Fair given chronicity and comorbidities   CLINICAL DECISION MAKING: Evolving/moderate complexity   EVALUATION COMPLEXITY: Moderate     GOALS: Goals reviewed with patient? No   SHORT TERM GOALS: Target date: 08/18/2022 Pt will demonstrate appropriate understanding and performance of initially prescribed HEP in order to facilitate improved independence with management of symptoms.  Baseline: HEP provided on eval Goal status: MET 08/18/2022   2. Pt will score greater than or equal to 44 on FOTO in order to demonstrate improved perception of function due to symptoms.            Baseline: 38 08/18/22: 43            Goal status: PARTIALLY MET 08/18/2022   LONG TERM GOALS: Target date: 09/15/2022   Pt will score 49 on FOTO in order to demonstrate improved perception of function due to symptoms. Baseline: 38 Goal status: PARTIALLY MET 08/27/2022   2. Pt will demonstrate at least 45 degrees of active cervical rotation ROM BIL in order to demonstrate improved environmental awareness and safety with driving.  Baseline: see ROM chart above Goal status: MET 08/18/22   3. Pt will demonstrate at least 4+/5 shoulder MMT for improved symmetry of UE strength and improved tolerance to functional movements.  Baseline: see MMT chart above Goal status: Not Met   4. Pt will report/demonstrate ability to stand/walk for up to 30min with less than 2 point increase on NPS in order to demonstrate  improved tolerance to household activities. Baseline: up to 9/10 pain with prolonged activities Goal status: Not Met, "We have hired cleaning people and go out out here."                         5. Pt will report/demonstrate independence with final prescribed HEP in order to facilitate improved  self efficacy with management of symptoms.             Goal status: MET                                      PLAN:   PT FREQUENCY: 2x/week   PT DURATION: 8 weeks   PLANNED INTERVENTIONS: Therapeutic exercises, Therapeutic activity, Neuromuscular re-education, Balance training, Gait training, Patient/Family education, Self Care, Vestibular training, Dry Needling, Cryotherapy, Moist heat, Taping, Manual therapy, and Re-evaluation   PLAN FOR NEXT SESSION: gentle postural stability/strengthening, cervical mobility as appropriate. Consider STM to reduce tightness PRN.  Consider thoracic ext mobility.      Shelby Dubin, PT, DPT   08/27/22  4:12 PM    PHYSICAL THERAPY DISCHARGE SUMMARY  Visits from Start of Care: 10  Current functional level related to goals / functional outcomes: Patient is able to perform all IADLs and ADLs to patient satisfaction.    Remaining deficits: Diminished UE strength bilaterally.   Education / Equipment: Provided updated HEP to include Cervical Spine AROM, Cervical muscle strengthening, thoracic mobility and thoracic/postural strengthening for ongoing progression.    Patient agrees to discharge. Patient goals were partially met. Patient is being discharged due to being pleased with the current functional level.

## 2022-08-27 NOTE — Telephone Encounter (Signed)
Made in error

## 2022-08-30 ENCOUNTER — Other Ambulatory Visit: Payer: Self-pay | Admitting: Physician Assistant

## 2022-09-03 ENCOUNTER — Ambulatory Visit (INDEPENDENT_AMBULATORY_CARE_PROVIDER_SITE_OTHER): Payer: Medicare Other | Admitting: Family Medicine

## 2022-09-03 ENCOUNTER — Encounter: Payer: Self-pay | Admitting: Family Medicine

## 2022-09-03 VITALS — BP 124/78 | HR 77 | Temp 98.4°F | Ht 63.0 in | Wt 199.6 lb

## 2022-09-03 DIAGNOSIS — F40243 Fear of flying: Secondary | ICD-10-CM

## 2022-09-03 MED ORDER — OXYCODONE-ACETAMINOPHEN 5-325 MG PO TABS: 1.0000 | ORAL_TABLET | Freq: Three times a day (TID) | ORAL | 0 refills | Status: DC | PRN

## 2022-09-03 MED ORDER — DIAZEPAM 5 MG PO TABS: 10.0000 mg | ORAL_TABLET | Freq: Three times a day (TID) | ORAL | 0 refills | Status: DC | PRN

## 2022-09-03 NOTE — Progress Notes (Signed)
Subjective:    Patient ID: Janet Peterson, female    DOB: [birth year], 72 y.o.   MRN: [id number] Patient has an upcoming trip to [place].  They are flying to [place] in [place].  However she is very scared about flying.  She has been on Klonopin for years.  She takes 1 mg at night to help her sleep.  However she has been on the medication for so long that she feels that she is growing used to the medication and she does not feel that it would be strong enough to help relax her for a 8-hour plane flight  Past Medical History:  Diagnosis Date   Ankylosing spondylitis    Anxiety and depression    Depression    Fibromyalgia    GERD (gastroesophageal reflux disease)    Hiatal hernia    per patient, dx by GI    History of basal cell carcinoma excision    FACE, 1992  &  1996   History of hiatal hernia    History of thrush    Hyperlipidemia    Hypertension    Insomnia    Left breast mass    OCD (obsessive compulsive disorder)    Osteopenia    PONV (postoperative nausea and vomiting)    Psoriatic arthritis    PVC (premature ventricular contraction)    Rheumatoid arthritis    Past Surgical History:  Procedure Laterality Date   BIOPSY  09/29/2019   Procedure: BIOPSY;  Surgeon: Kerin Salen, MD;  Location: [facility];  Service: Gastroenterology;;   BREAST EXCISIONAL BIOPSY Right    BREAST EXCISIONAL BIOPSY Left 05/04/2018   BREAST LUMPECTOMY WITH RADIOACTIVE SEED LOCALIZATION Left 05/04/2018   Procedure: LEFT BREAST LUMPECTOMY WITH RADIOACTIVE SEED LOCALIZATION AND LEFT BREAST NIPPLE BIOPSY;  Surgeon: Griselda Miner, MD;  Location:  [facility];  Service: General;  Laterality: Left;   BREAST SURGERY  05/25/1973   lumpectomy-- benign   BUNIONECTOMY  05/25/1989   CATARACT EXTRACTION W/ INTRAOCULAR LENS  IMPLANT, BILATERAL  05/25/1998   CERVICAL FUSION  07/24/2011   C5 -- C7   CESAREAN SECTION  05/26/1983   COLONOSCOPY WITH PROPOFOL N/A 09/29/2019   Procedure: COLONOSCOPY WITH  PROPOFOL;  Surgeon: Kerin Salen, MD;  Location: [facility];  Service: Gastroenterology;  Laterality: N/A;   DISTAL INTERPHALANGEAL JOINT FUSION Right 03/14/2015   Procedure: RIGHT LONG FINGER DISTAL INTERPHALANGEAL JOINT ARTHRODESIS;  Surgeon: Bradly Bienenstock, MD;  Location: [facility];  Service: Orthopedics;  Laterality: Right;   ESOPHAGOGASTRODUODENOSCOPY (EGD) WITH PROPOFOL N/A 09/29/2019   Procedure: ESOPHAGOGASTRODUODENOSCOPY (EGD) WITH PROPOFOL;  Surgeon: Kerin Salen, MD;  Location: [facility];  Service: Gastroenterology;  Laterality: N/A;   EYE SURGERY  05/25/1993   rk (laser surgery), semi cornea transplant, detacted retina,  fluid removal   HYSTEROSCOPY WITH D & C N/A 12/09/2020   Procedure: DILATATION AND CURETTAGE /HYSTEROSCOPY;  Surgeon: Steva Ready, DO;  Location:  SUR[facility]ERY CENTER;  Service: Gynecology;  Laterality: N/A;   KNEE ARTHROSCOPY Left 03/03/2004   POLYPECTOMY  09/29/2019   Procedure: POLYPECTOMY;  Surgeon: Kerin Salen, MD;  Location: [facility];  Service: Gastroenterology;;   POSTERIOR VITRECTOMY RIGHT EYE AND LASER   10/27/1999   SHOULDER SURGERY Right 05/26/1995   SKIN BIOPSY  05/2022   lower stomach   SPINAL FIXATION SURGERY W/ IMPLANT  2013 rod #1//  2014  rod 2   S1 -- T10  (rod #1)//   S1 -- T4 (rod #2)  THORACIC FUSION  03/13/2013   REMOVAL HARDWARE/  BONE GRAFT FUSION T10//  REVISION OF RODS   TOTAL KNEE ARTHROPLASTY Left 12/14/2005   UVULOPALATOPHARYNGOPLASTY  04/26/2006   w/  TONSILLECTOMY/  TURBINATE REDUCTIONS/  BILATERAL ANTERIOR ETHMOIDECTOMY   Current Outpatient Medications on File Prior to Visit  Medication Sig Dispense Refill   buPROPion (WELLBUTRIN XL) 300 MG 24 hr tablet Take 300 mg by mouth every morning.  1   ciprofloxacin (CILOXAN) 0.3 % ophthalmic solution Place 2 drops into the right eye every 2 (two) hours. Administer 1 drop, every 2 hours, while awake, for 2 days. Then 1 drop, every 4 hours, while awake, for  the next 5 days. (Patient taking differently: Place 2 drops into the right eye as needed. Administer 1 drop, every 2 hours, while awake, for 2 days. Then 1 drop, every 4 hours, while awake, for the next 5 days.) 5 mL 0   clonazePAM (KLONOPIN) 1 MG tablet TAKE 1 TABLET BY MOUTH EVERY DAY AS NEEDED FOR ANXIETY (Patient taking differently: Take 1 mg by mouth daily as needed for anxiety.) 30 tablet 1   dexlansoprazole (DEXILANT) 60 MG capsule TAKE 1 CAPSULE BY MOUTH EVERY DAY 90 capsule 1   doxazosin (CARDURA) 2 MG tablet TAKE 1 TABLET BY MOUTH EVERY DAY 90 tablet 0   doxepin (SINEQUAN) 50 MG capsule Take 50 mg by mouth.     ezetimibe (ZETIA) 10 MG tablet TAKE 1 TABLET BY MOUTH EVERY DAY 90 tablet 2   finasteride (PROSCAR) 5 MG tablet Take 0.5 tablets (2.5 mg total) by mouth daily. 90 tablet 1   FLUoxetine (PROZAC) 20 MG capsule Take 20 mg by mouth 3 (three) times daily.     fluticasone (FLONASE) 50 MCG/ACT nasal spray Place 2 sprays into both nostrils daily as needed for allergies. 16 g 11   hydrochlorothiazide (HYDRODIURIL) 25 MG tablet TAKE 1 TABLET (25 MG TOTAL) BY MOUTH DAILY. 90 tablet 3   hydrocortisone cream 1 % Apply 1 Application topically 2 (two) times daily. (Patient not taking: Reported on 07/30/2022) 30 g 0   INGREZZA 80 MG capsule Take one capsule by mouth daily (Patient not taking: Reported on 07/30/2022) 30 capsule 1   leflunomide (ARAVA) 20 MG tablet TAKE 1 TABLET BY MOUTH EVERY DAY 90 tablet 0   linaclotide (LINZESS) 72 MCG capsule Take 1 capsule (72 mcg total) by mouth daily before breakfast. (Patient taking differently: Take 72 mcg by mouth as needed.) 90 capsule 3   metoprolol succinate (TOPROL-XL) 50 MG 24 hr tablet TAKE 1 TABLET BY MOUTH ONCE DAILY FOLLOWING A MEAL 90 tablet 1   ondansetron (ZOFRAN) 4 MG tablet Take 4 mg by mouth 4 (four) times daily as needed for nausea.     pregabalin (LYRICA) 100 MG capsule TAKE 1 CAPSULE BY MOUTH TWICE A DAY 60 capsule 5   pregabalin (LYRICA) 75  MG capsule Take 1 capsule (75 mg total) by mouth 2 (two) times daily. 60 capsule 0   tiZANidine (ZANAFLEX) 4 MG capsule Take 1 capsule (4 mg total) by mouth 3 (three) times daily. (Patient taking differently: Take 4 mg by mouth as needed.) 30 capsule 1   valbenazine (INGREZZA) 80 MG capsule Take 1 capsule (80 mg total) by mouth daily. 90 capsule 3   valsartan (DIOVAN) 320 MG tablet TAKE 1 TABLET BY MOUTH EVERY DAY 90 tablet 3   No current facility-administered medications on file prior to visit.   Allergies  Allergen Reactions  Latex Rash    And Mouth sores Other reaction(s): rash Other reaction(s): rash Other reaction(s): rash   Amlodipine Besy-Benazepril Hcl Other (See Comments)    COUGH   Amoxicillin-Pot Clavulanate Diarrhea and Nausea And Vomiting    Other reaction(s): sick on stomach Other reaction(s): sick on stomach   Bextra [Valdecoxib] Diarrhea and Nausea And Vomiting    REFLUX   Chlorhexidine     rash Other reaction(s): rash all over body Other reaction(s): rash all over body   Lipitor [Atorvastatin Calcium] Other (See Comments)    MYALGIAS   Sulfa Antibiotics Nausea And Vomiting    CRAMPING Other reaction(s): stomach hurts Other reaction(s): stomach hurts   Zocor [Simvastatin] Other (See Comments)    MYALGIA   Social History   Socioeconomic History   Marital status: Married    Spouse name: Daryl   Number of children: Not on file   Years of education: Not on file   Highest education level: Not on file  Occupational History    Comment: retired  Tobacco Use   Smoking status: Former    Packs/day: 0.50    Years: 10.00    Additional pack years: 0.00    Total pack years: 5.00    Types: Cigarettes    Quit date: 03/07/1995    Years since quitting: 27.5    Passive exposure: Never   Smokeless tobacco: Never  Vaping Use   Vaping Use: Never used  Substance and Sexual Activity   Alcohol use: Yes    Comment: rare   Drug use: Never   Sexual activity: Not on  file  Other Topics Concern   Not on file  Social History Narrative   Lives with husband   Social Determinants of Health   Financial Resource Strain: Low Risk  (08/13/2022)   Overall Financial Resource Strain (CARDIA)    Difficulty of Paying Living Expenses: Not hard at all  Food Insecurity: No Food Insecurity (08/13/2022)   Hunger Vital Sign    Worried About Running Out of Food in the Last Year: Never true    Ran Out of Food in the Last Year: Never true  Transportation Needs: No Transportation Needs (08/13/2022)   PRAPARE - Administrator, Civil ServiceTransportation    Lack of Transportation (Medical): No    Lack of Transportation (Non-Medical): No  Physical Activity: Inactive (08/13/2022)   Exercise Vital Sign    Days of Exercise per Week: 0 days    Minutes of Exercise per Session: 0 min  Stress: No Stress Concern Present (08/13/2022)   [employer] - Occupational Stress Questionnaire    Feeling of Stress : Not at all  Social Connections: Socially Integrated (08/13/2022)   Social Connection and Isolation Panel [NHANES]    Frequency of Communication with Friends and Family: More than three times a week    Frequency of Social Gatherings with Friends and Family: More than three times a week    Attends Religious Services: More than 4 times per year    Active Member of [employer] Financial[employer]ubs or Organizations: Yes    Attends BankerClub or Organization Meetings: More than 4 times per year    Marital Status: Married  Catering managerntimate Partner Violence: Not At Risk (08/13/2022)   Humiliation, Afraid, Rape, and Kick questionnaire    Fear of Current or Ex-Partner: No    Emotionally Abused: No    Physically Abused: No    Sexually Abused: No    Review of Systems  All other systems reviewed and are negative.  Objective:   Physical Exam Vitals reviewed.  Constitutional:      General: She is not in acute distress.    Appearance: Normal appearance. She is normal weight. She is not ill-appearing or toxic-appearing.   Cardiovascular:     Rate and Rhythm: Normal rate and regular rhythm.     Heart sounds: Normal heart sounds. No murmur heard.    No friction rub. No gallop.  Pulmonary:     Effort: Pulmonary effort is normal. No respiratory distress.     Breath sounds: Normal breath sounds. No wheezing, rhonchi or rales.  Abdominal:     General: Abdomen is flat. Bowel sounds are normal. There is no distension.     Palpations: Abdomen is soft.     Tenderness: There is no abdominal tenderness.  Musculoskeletal:     Right lower leg: No edema.     Left lower leg: No edema.  Neurological:     Mental Status: She is alert.          Assessment & Plan:  Anxiety with flying Recommended stopping Klonopin.  Use Valium 5 to 10 mg every 8 hours as needed.  I cautioned the patient about abusing benzodiazepines.  Do not use Valium with Klonopin.  I anticipate that she will take 1 to 2 pills prior to the plane flight.  Cautioned the patient to not use the medication with oxycodone..  After the trip I would resume Klonopin and discontinue Valium

## 2022-09-28 ENCOUNTER — Other Ambulatory Visit: Payer: Self-pay | Admitting: Physician Assistant

## 2022-09-28 NOTE — Telephone Encounter (Signed)
Last Fill: 06/29/2022  Labs: 07/30/2022 RBC 3.60, Hgb 11.1, Hct 34.1, Sodium 134, ALT 4  Next Visit: 11/10/2022  Last Visit: 06/10/2022  DX: Psoriatic arthritis   Current Dose per office note 06/10/2022: Arava 20 mg 1 tablet by mouth daily.   Okay to refill Arava ?

## 2022-09-29 ENCOUNTER — Encounter: Payer: Self-pay | Admitting: Family Medicine

## 2022-09-29 ENCOUNTER — Other Ambulatory Visit: Payer: Self-pay

## 2022-09-29 ENCOUNTER — Ambulatory Visit (INDEPENDENT_AMBULATORY_CARE_PROVIDER_SITE_OTHER): Payer: Medicare Other | Admitting: Family Medicine

## 2022-09-29 VITALS — BP 156/98 | HR 78 | Temp 97.8°F | Ht 63.0 in | Wt 194.0 lb

## 2022-09-29 DIAGNOSIS — B9689 Other specified bacterial agents as the cause of diseases classified elsewhere: Secondary | ICD-10-CM

## 2022-09-29 DIAGNOSIS — J019 Acute sinusitis, unspecified: Secondary | ICD-10-CM | POA: Diagnosis not present

## 2022-09-29 DIAGNOSIS — R0602 Shortness of breath: Secondary | ICD-10-CM

## 2022-09-29 MED ORDER — AZITHROMYCIN 250 MG PO TABS: ORAL_TABLET | ORAL | 0 refills | Status: DC

## 2022-09-29 MED ORDER — PREDNISONE 20 MG PO TABS: ORAL_TABLET | ORAL | 0 refills | Status: DC

## 2022-09-29 MED ORDER — DOXAZOSIN MESYLATE 2 MG PO TABS: 2.0000 mg | ORAL_TABLET | Freq: Every day | ORAL | 1 refills | Status: DC

## 2022-09-29 NOTE — Telephone Encounter (Signed)
Prescription Request  09/29/2022  LOV: 09/29/22  What is the name of the medication or equipment? doxazosin (CARDURA) 2 MG tablet [[id number]]  Have you contacted your pharmacy to request a refill? Yes   Which pharmacy would you like this sent to?  [id number] [place], [place] - [address] [place] [address] Phone: [phone] Fax: [phone]    Patient notified that their request is being sent to the clinical staff for review and that they should receive a response within 2 business days.   Please advise at [facility] [phone]

## 2022-09-29 NOTE — Progress Notes (Signed)
Subjective:    Patient ID: Janet Peterson, female    DOB: 1950-12-01, 72 y.o.   MRN: [id number]  Patient recently went on a trip to [place].  A consult was breath and congestion and cough.  She went to an emergency room where she was diagnosed with sinus infection and was given Augmentin 875 mg twice daily.  The antibiotic caused severe stomach discomfort so she discontinued antibiotic after taking 5 days.  She continues to have sinus pressure and sinus pain and congestion.  She continues to have cough.  However she reports shortness of breath.  When asked her to describe it she says it feels like an elephant on her chest.  However she is sitting comfortably in the exam room talking very nonchalant.  I believe that this may be a little bit of an exaggeration.  She denies any pleurisy.  She denies any fevers or chills.  Pulse oximetry is 99% on room air.  Her lungs are clear. Past Medical History:  Diagnosis Date   Ankylosing spondylitis (HCC)    Anxiety and depression    Depression    Fibromyalgia    GERD (gastroesophageal reflux disease)    Hiatal hernia    per patient, dx by GI    History of basal cell carcinoma excision    FACE, 1992  &  1996   History of hiatal hernia    History of thrush    Hyperlipidemia    Hypertension    Insomnia    Left breast mass    OCD (obsessive compulsive disorder)    Osteopenia    PONV (postoperative nausea and vomiting)    Psoriatic arthritis (HCC)    PVC (premature ventricular contraction)    Rheumatoid arthritis (HCC)    Past Surgical History:  Procedure Laterality Date   BIOPSY  09/29/2019   Procedure: BIOPSY;  Surgeon: Kerin Salen, MD;  [facility]: [facility];  Service: Gastroenterology;;   BREAST EXCISIONAL BIOPSY Right    BREAST EXCISIONAL BIOPSY Left 05/04/2018   BREAST LUMPECTOMY WITH RADIOACTIVE SEED LOCALIZATION Left 05/04/2018   Procedure: LEFT BREAST LUMPECTOMY WITH RADIOACTIVE SEED LOCALIZATION AND LEFT BREAST NIPPLE BIOPSY;  Surgeon:  Griselda Miner, MD;  Location: [facility];  Service: General;  Laterality: Left;   BREAST SURGERY  05/25/1973   lumpectomy-- benign   BUNIONECTOMY  05/25/1989   CATARACT EXTRACTION W/ INTRAOCULAR LENS  IMPLANT, BILATERAL  05/25/1998   CERVICAL FUSION  07/24/2011   C5 -- C7   CESAREAN SECTION  05/26/1983   COLONOSCOPY WITH PROPOFOL N/A 09/29/2019   Procedure: COLONOSCOPY WITH PROPOFOL;  Surgeon: Kerin Salen, MD;  Location: [facility];  Service: Gastroenterology;  Laterality: N/A;   DISTAL INTERPHALANGEAL JOINT FUSION Right 03/14/2015   Procedure: RIGHT LONG FINGER DISTAL INTERPHALANGEAL JOINT ARTHRODESIS;  Surgeon: Bradly Bienenstock, MD;  Location: [facility] ;  Service: Orthopedics;  Laterality: Right;   ESOPHAGOGASTRODUODENOSCOPY (EGD) WITH PROPOFOL N/A 09/29/2019   Procedure: ESOPHAGOGASTRODUODENOSCOPY (EGD) WITH PROPOFOL;  Surgeon: Kerin Salen, MD;  Location: [facility];  Service: Gastroenterology;  Laterality: N/A;   EYE SURGERY  05/25/1993   rk (laser surgery), semi cornea transplant, detacted retina,  fluid removal   HYSTEROSCOPY WITH D & C N/A 12/09/2020   Procedure: DILATATION AND CURETTAGE /HYSTEROSCOPY;  Surgeon: Steva Ready, DO;  Location: [facility];  Service: Gynecology;  Laterality: N/A;   KNEE ARTHROSCOPY Left 03/03/2004   POLYPECTOMY  09/29/2019   Procedure: POLYPECTOMY;  Surgeon: Kerin Salen, MD;  [facility];  Service: Gastroenterology;;   POSTERIOR VITRECTOMY RIGHT EYE AND LASER   10/27/1999   SHOULDER SURGERY Right 05/26/1995   SKIN BIOPSY  05/2022   lower stomach   SPINAL FIXATION SURGERY W/ IMPLANT  2013 rod #1//  2014  rod 2   S1 -- T10  (rod #1)//   S1 -- T4 (rod #2)   THORACIC FUSION  03/13/2013   REMOVAL HARDWARE/  BONE GRAFT FUSION T10//  REVISION OF RODS   TOTAL KNEE ARTHROPLASTY Left 12/14/2005   UVULOPALATOPHARYNGOPLASTY  04/26/2006   w/  TONSILLECTOMY/  TURBINATE REDUCTIONS/  BILATERAL ANTERIOR  ETHMOIDECTOMY   Current Outpatient Medications on File Prior to Visit  Medication Sig Dispense Refill   buPROPion (WELLBUTRIN XL) 300 MG 24 hr tablet Take 300 mg by mouth every morning.  1   ciprofloxacin (CILOXAN) 0.3 % ophthalmic solution Place 2 drops into the right eye every 2 (two) hours. Administer 1 drop, every 2 hours, while awake, for 2 days. Then 1 drop, every 4 hours, while awake, for the next 5 days. (Patient taking differently: Place 2 drops into the right eye as needed. Administer 1 drop, every 2 hours, while awake, for 2 days. Then 1 drop, every 4 hours, while awake, for the next 5 days.) 5 mL 0   clonazePAM (KLONOPIN) 1 MG tablet TAKE 1 TABLET BY MOUTH EVERY DAY AS NEEDED FOR ANXIETY (Patient taking differently: Take 1 mg by mouth daily as needed for anxiety.) 30 tablet 1   dexlansoprazole (DEXILANT) 60 MG capsule TAKE 1 CAPSULE BY MOUTH EVERY DAY 90 capsule 1   diazepam (VALIUM) 5 MG tablet Take 2 tablets (10 mg total) by mouth every 8 (eight) hours as needed for anxiety. 30 tablet 0   doxazosin (CARDURA) 2 MG tablet TAKE 1 TABLET BY MOUTH EVERY DAY 90 tablet 0   doxepin (SINEQUAN) 50 MG capsule Take 50 mg by mouth.     ezetimibe (ZETIA) 10 MG tablet TAKE 1 TABLET BY MOUTH EVERY DAY 90 tablet 2   finasteride (PROSCAR) 5 MG tablet Take 0.5 tablets (2.5 mg total) by mouth daily. 90 tablet 1   FLUoxetine (PROZAC) 20 MG capsule Take 20 mg by mouth 3 (three) times daily.     fluticasone (FLONASE) 50 MCG/ACT nasal spray Place 2 sprays into both nostrils daily as needed for allergies. 16 g 11   hydrochlorothiazide (HYDRODIURIL) 25 MG tablet TAKE 1 TABLET (25 MG TOTAL) BY MOUTH DAILY. 90 tablet 3   hydrocortisone cream 1 % Apply 1 Application topically 2 (two) times daily. (Patient not taking: Reported on 07/30/2022) 30 g 0   INGREZZA 80 MG capsule Take one capsule by mouth daily (Patient not taking: Reported on 07/30/2022) 30 capsule 1   leflunomide (ARAVA) 20 MG tablet TAKE 1 TABLET BY MOUTH  EVERY DAY 90 tablet 0   linaclotide (LINZESS) 72 MCG capsule Take 1 capsule (72 mcg total) by mouth daily before breakfast. (Patient taking differently: Take 72 mcg by mouth as needed.) 90 capsule 3   metoprolol succinate (TOPROL-XL) 50 MG 24 hr tablet TAKE 1 TABLET BY MOUTH ONCE DAILY FOLLOWING A MEAL 90 tablet 1   ondansetron (ZOFRAN) 4 MG tablet Take 4 mg by mouth 4 (four) times daily as needed for nausea.     oxyCODONE-acetaminophen (PERCOCET) 5-325 MG tablet Take 1 tablet by mouth every 8 (eight) hours as needed for severe pain. 60 tablet 0   pregabalin (LYRICA) 100 MG capsule TAKE 1 CAPSULE BY  MOUTH TWICE A DAY 60 capsule 5   pregabalin (LYRICA) 75 MG capsule Take 1 capsule (75 mg total) by mouth 2 (two) times daily. 60 capsule 0   tiZANidine (ZANAFLEX) 4 MG capsule Take 1 capsule (4 mg total) by mouth 3 (three) times daily. (Patient taking differently: Take 4 mg by mouth as needed.) 30 capsule 1   valbenazine (INGREZZA) 80 MG capsule Take 1 capsule (80 mg total) by mouth daily. 90 capsule 3   valsartan (DIOVAN) 320 MG tablet TAKE 1 TABLET BY MOUTH EVERY DAY 90 tablet 3   No current facility-administered medications on file prior to visit.   Allergies  Allergen Reactions   Latex Rash    And Mouth sores Other reaction(s): rash Other reaction(s): rash Other reaction(s): rash   Amlodipine Besy-Benazepril Hcl Other (See Comments)    COUGH   Amoxicillin-Pot Clavulanate Diarrhea and Nausea And Vomiting    Other reaction(s): sick on stomach Other reaction(s): sick on stomach   Bextra [Valdecoxib] Diarrhea and Nausea And Vomiting    REFLUX   Chlorhexidine     rash Other reaction(s): rash all over body Other reaction(s): rash all over body   Lipitor [Atorvastatin Calcium] Other (See Comments)    MYALGIAS   Sulfa Antibiotics Nausea And Vomiting    CRAMPING Other reaction(s): stomach hurts Other reaction(s): stomach hurts   Zocor [Simvastatin] Other (See Comments)    MYALGIA    Social History   Socioeconomic History   Marital status: Married    Spouse name: Daryl   Number of children: Not on file   Years of education: Not on file   Highest education level: Not on file  Occupational History    Comment: retired  Tobacco Use   Smoking status: Former    Packs/day: 0.50    Years: 10.00    Additional pack years: 0.00    Total pack years: 5.00    Types: Cigarettes    Quit date: 03/07/1995    Years since quitting: 27.5    Passive exposure: Never   Smokeless tobacco: Never  Vaping Use   Vaping Use: Never used  Substance and Sexual Activity   Alcohol use: Yes    Comment: rare   Drug use: Never   Sexual activity: Not on file  Other Topics Concern   Not on file  Social History Narrative   Lives with husband   Social Determinants of Health   Financial Resource Strain: Low Risk  (08/13/2022)   Overall Financial Resource Strain (CARDIA)    Difficulty of Paying Living Expenses: Not hard at all  Food Insecurity: No Food Insecurity (08/13/2022)   Hunger Vital Sign    Worried About Running Out of Food in the Last Year: Never true    Ran Out of Food in the Last Year: Never true  Transportation Needs: No Transportation Needs (08/13/2022)   PRAPARE - Administrator, Civil Service (Medical): No    Lack of Transportation (Non-Medical): No  Physical Activity: Inactive (08/13/2022)   Exercise Vital Sign    Days of Exercise per Week: 0 days    Minutes of Exercise per Session: 0 min  Stress: No Stress Concern Present (08/13/2022)   [employer] of Occupational Health - Occupational Stress Questionnaire    Feeling of Stress : Not at all  Social Connections: Socially Integrated (08/13/2022)   Social Connection and Isolation Panel [NHANES]    Frequency of Communication with Friends and Family: More than three times a week  Frequency of Social Gatherings with Friends and Family: More than three times a week    Attends Religious Services: More than 4  times per year    Active Member of [employer] or Organizations: Yes    Attends Banker Meetings: More than 4 times per year    Marital Status: Married  Catering manager Violence: Not At Risk (08/13/2022)   Humiliation, Afraid, Rape, and Kick questionnaire    Fear of Current or Ex-Partner: No    Emotionally Abused: No    Physically Abused: No    Sexually Abused: No    Review of Systems  All other systems reviewed and are negative.      Objective:   Physical Exam Vitals reviewed.  Constitutional:      General: She is not in acute distress.    Appearance: Normal appearance. She is normal weight. She is not ill-appearing or toxic-appearing.  HENT:     Right Ear: Tympanic membrane and ear canal normal.     Left Ear: Tympanic membrane and ear canal normal.     Nose: Congestion and rhinorrhea present.     Right Sinus: Maxillary sinus tenderness present.     Left Sinus: Maxillary sinus tenderness present.     Mouth/Throat:     Pharynx: Oropharynx is clear. No oropharyngeal exudate or posterior oropharyngeal erythema.  Cardiovascular:     Rate and Rhythm: Normal rate and regular rhythm.     Heart sounds: Normal heart sounds. No murmur heard.    No friction rub. No gallop.  Pulmonary:     Effort: Pulmonary effort is normal. No respiratory distress.     Breath sounds: Normal breath sounds. No wheezing, rhonchi or rales.  Abdominal:     General: Abdomen is flat. Bowel sounds are normal. There is no distension.     Palpations: Abdomen is soft.     Tenderness: There is no abdominal tenderness.  Musculoskeletal:     Right lower leg: No edema.     Left lower leg: No edema.  Neurological:     Mental Status: She is alert.          Assessment & Plan:  SOB (shortness of breath) - Plan: EKG 12-Lead, CBC with Differential/Platelet, COMPLETE METABOLIC PANEL WITH GFR, D-dimer, quantitative  Acute bacterial rhinosinusitis Patient appears to have a sinus infection today.  I will  treat that with a Z-Pak as well as a prednisone taper pack.  I believe the shortness of breath is likely subjective due to head congestion and cough with possibly a component of anxiety.  EKG today shows normal sinus rhythm with normal intervals and normal axis.  Patient has ST depression in lead I, aVL, V4, V5 V6.  This is a chronic finding was present on her EKG in November.  Therefore there is no acute change in her EKG.  Furthermore her exam does not support a heart attack.  I will check a D-dimer given her recent plane flight to rule out the chance of pulmonary embolism however I feel that this is unlikely based on her physical exam.

## 2022-09-29 NOTE — Telephone Encounter (Signed)
Requested Prescriptions  Pending Prescriptions Disp Refills   doxazosin (CARDURA) 2 MG tablet 90 tablet 1    Sig: Take 1 tablet (2 mg total) by mouth daily.     Cardiovascular:  Alpha Blockers Failed - 09/29/2022 12:02 PM      Failed - Last BP in normal range    BP Readings from Last 1 Encounters:  09/29/22 (!) 156/98         Failed - Valid encounter within last 6 months    Recent Outpatient Visits           11 months ago Hyponatremia   [facility] Tanya Nones, [name], MD   1 year ago Bronchitis   [facility] Tanya Nones, [name], MD   1 year ago Bronchitis   [facility] Tanya Nones, [name], MD   1 year ago Anemia, unspecified type   [facility] Donita Brooks, MD   1 year ago Acute non-recurrent pansinusitis   [facility] Valentino Nose, NP       Future Appointments             In 1 month [name] [facility]

## 2022-09-30 LAB — COMPLETE METABOLIC PANEL WITH GFR
AG Ratio: 1.9 (calc) (ref 1.0–2.5)
ALT: 4 U/L — ABNORMAL LOW (ref 6–29)
AST: 12 U/L (ref 10–35)
Albumin: 3.8 g/dL (ref 3.6–5.1)
Alkaline phosphatase (APISO): 97 U/L (ref 37–153)
BUN: 8 mg/dL (ref 7–25)
CO2: 29 mmol/L (ref 20–32)
Calcium: 9.7 mg/dL (ref 8.6–10.4)
Chloride: 101 mmol/L (ref 98–110)
Creat: 0.77 mg/dL (ref 0.60–1.00)
Globulin: 2 g/dL (calc) (ref 1.9–3.7)
Glucose, Bld: 80 mg/dL (ref 65–99)
Potassium: 3.8 mmol/L (ref 3.5–5.3)
Sodium: 139 mmol/L (ref 135–146)
Total Bilirubin: 0.3 mg/dL (ref 0.2–1.2)
Total Protein: 5.8 g/dL — ABNORMAL LOW (ref 6.1–8.1)
eGFR: 82 mL/min/{1.73_m2} (ref >=60)

## 2022-09-30 LAB — CBC WITH DIFFERENTIAL/PLATELET
Absolute Monocytes: 678 cells/uL (ref 200–950)
Basophils Absolute: 48 cells/uL (ref 0–200)
Basophils Relative: 0.8 %
Eosinophils Absolute: 102 cells/uL (ref 15–500)
Eosinophils Relative: 1.7 %
HCT: 33.7 % — ABNORMAL LOW (ref 35.0–45.0)
Hemoglobin: 11 g/dL — ABNORMAL LOW (ref 11.7–15.5)
Lymphs Abs: 1476 cells/uL (ref 850–3900)
MCH: 30.6 pg (ref 27.0–33.0)
MCHC: 32.6 g/dL (ref 32.0–36.0)
MCV: 93.6 fL (ref 80.0–100.0)
MPV: 11 fL (ref 7.5–12.5)
Monocytes Relative: 11.3 %
Neutro Abs: 3696 cells/uL (ref 1500–7800)
Neutrophils Relative %: 61.6 %
Platelets: 256 10*3/uL (ref 140–400)
RBC: 3.6 10*6/uL — ABNORMAL LOW (ref 3.80–5.10)
RDW: 12.6 % (ref 11.0–15.0)
Total Lymphocyte: 24.6 %
WBC: 6 10*3/uL (ref 3.8–10.8)

## 2022-09-30 LAB — D-DIMER, QUANTITATIVE: D-Dimer, Quant: 1.48 mcg/mL FEU — ABNORMAL HIGH (ref <0.50)

## 2022-10-01 ENCOUNTER — Telehealth: Payer: Self-pay

## 2022-10-01 ENCOUNTER — Other Ambulatory Visit: Payer: Self-pay | Admitting: Family Medicine

## 2022-10-01 DIAGNOSIS — R7989 Other specified abnormal findings of blood chemistry: Secondary | ICD-10-CM

## 2022-10-01 DIAGNOSIS — R0602 Shortness of breath: Secondary | ICD-10-CM

## 2022-10-01 DIAGNOSIS — R071 Chest pain on breathing: Secondary | ICD-10-CM

## 2022-10-01 NOTE — Telephone Encounter (Signed)
Pt called, advise pt of pcp's recommendations,"D dimer is elevated.  I do not think she has a PE but I will order CT csan of chest to rule out. "  FYI: Per pt also did a at home covid test: Negative

## 2022-10-08 ENCOUNTER — Ambulatory Visit
Admission: RE | Admit: 2022-10-08 | Discharge: 2022-10-08 | Disposition: A | Discharge status: Home / Self Care | Payer: Medicare Other | Source: Ambulatory Visit | Attending: Family Medicine | Admitting: Family Medicine

## 2022-10-08 DIAGNOSIS — R7989 Other specified abnormal findings of blood chemistry: Secondary | ICD-10-CM

## 2022-10-08 DIAGNOSIS — R0602 Shortness of breath: Secondary | ICD-10-CM

## 2022-10-08 DIAGNOSIS — R071 Chest pain on breathing: Secondary | ICD-10-CM

## 2022-10-08 MED ORDER — IOPAMIDOL (ISOVUE-370) INJECTION 76%
75.0000 mL | Freq: Once | INTRAVENOUS | Status: AC | PRN
  Administered 2022-10-08: 75 mL via INTRAVENOUS

## 2022-10-09 ENCOUNTER — Other Ambulatory Visit: Payer: Self-pay | Admitting: Family Medicine

## 2022-10-09 ENCOUNTER — Telehealth: Payer: Self-pay

## 2022-10-09 MED ORDER — OXYCODONE-ACETAMINOPHEN 5-325 MG PO TABS: 1.0000 | ORAL_TABLET | Freq: Three times a day (TID) | ORAL | 0 refills | Status: DC | PRN

## 2022-10-09 NOTE — Telephone Encounter (Signed)
Pt called in to request a refill of this med oxyCODONE-acetaminophen (PERCOCET) 5-325 MG tablet [[id number]]  LOV: 09/29/22  PHARMACY: [id number] [place], [place] - [address] [place] [address] Phone: [phone]  Fax: [phone]   Also pt would like to speak with nurse about lab results from last visit please.   Cb#: [phone]

## 2022-10-13 ENCOUNTER — Ambulatory Visit (INDEPENDENT_AMBULATORY_CARE_PROVIDER_SITE_OTHER): Payer: Medicare Other | Admitting: Family Medicine

## 2022-10-13 ENCOUNTER — Encounter: Payer: Self-pay | Admitting: Family Medicine

## 2022-10-13 VITALS — BP 160/100 | HR 82 | Temp 98.7°F | Ht 63.0 in | Wt 196.0 lb

## 2022-10-13 DIAGNOSIS — I10 Essential (primary) hypertension: Secondary | ICD-10-CM

## 2022-10-13 DIAGNOSIS — J0100 Acute maxillary sinusitis, unspecified: Secondary | ICD-10-CM | POA: Insufficient documentation

## 2022-10-13 MED ORDER — DOXYCYCLINE HYCLATE 100 MG PO TABS: 100.0000 mg | ORAL_TABLET | Freq: Two times a day (BID) | ORAL | 0 refills | Status: DC

## 2022-10-13 MED ORDER — ALBUTEROL SULFATE HFA 108 (90 BASE) MCG/ACT IN AERS: 2.0000 | INHALATION_SPRAY | Freq: Four times a day (QID) | RESPIRATORY_TRACT | 0 refills | Status: DC | PRN

## 2022-10-13 NOTE — Assessment & Plan Note (Signed)
BP elevated in office today, likely due to acute illness, instructed to monitor at home and if remains elevated to schedule appointment with PCP.

## 2022-10-13 NOTE — Progress Notes (Addendum)
Acute Office Visit  Subjective:     Patient ID: Janet Peterson, female    DOB: 11-07-1950, 72 y.o.   MRN: [id number]  No chief complaint on file.   HPI Patient is in today for ongoing sinus congestion and pressure, dry cough, headache, left eye puffiness, left ear pain. She did have a sinus infection and was treated with z-pak and prednisone 2 weeks ago and her symptoms were improved somewhat. Prior to that she was treated for sinus infection with Augmentin when she was in [place] 1 month ago. Had negative covid test, positive d-dimer, and negative CT scan. Denies chest pain, fever, body aches. Has tried Mucinex and Tylenol   Review of Systems  All other systems reviewed and are negative.   Past Medical History:  Diagnosis Date   Ankylosing spondylitis (HCC)    Anxiety and depression    Depression    Fibromyalgia    GERD (gastroesophageal reflux disease)    Hiatal hernia    per patient, dx by GI    History of basal cell carcinoma excision    FACE, 1992  &  1996   History of hiatal hernia    History of thrush    Hyperlipidemia    Hypertension    Insomnia    Left breast mass    OCD (obsessive compulsive disorder)    Osteopenia    PONV (postoperative nausea and vomiting)    Psoriatic arthritis (HCC)    PVC (premature ventricular contraction)    Rheumatoid arthritis (HCC)    Past Surgical History:  Procedure Laterality Date   BIOPSY  09/29/2019   Procedure: BIOPSY;  Surgeon: Kerin Salen, MD;  Location: [facility];  Service: Gastroenterology;;   BREAST EXCISIONAL BIOPSY Right    BREAST EXCISIONAL BIOPSY Left 05/04/2018   BREAST LUMPECTOMY WITH RADIOACTIVE SEED LOCALIZATION Left 05/04/2018   Procedure: LEFT BREAST LUMPECTOMY WITH RADIOACTIVE SEED LOCALIZATION AND LEFT BREAST NIPPLE BIOPSY;  Surgeon: Griselda Miner, MD;  Location: [facility];  Service: General;  Laterality: Left;   BREAST SURGERY  05/25/1973   lumpectomy-- benign   BUNIONECTOMY   05/25/1989   CATARACT EXTRACTION W/ INTRAOCULAR LENS  IMPLANT, BILATERAL  05/25/1998   CERVICAL FUSION  07/24/2011   C5 -- C7   CESAREAN SECTION  05/26/1983   COLONOSCOPY WITH PROPOFOL N/A 09/29/2019   Procedure: COLONOSCOPY WITH PROPOFOL;  Surgeon: Kerin Salen, MD;  Location: [facility];  Service: Gastroenterology;  Laterality: N/A;   DISTAL INTERPHALANGEAL JOINT FUSION Right 03/14/2015   Procedure: RIGHT LONG FINGER DISTAL INTERPHALANGEAL JOINT ARTHRODESIS;  Surgeon: Bradly Bienenstock, MD;  Location: [facility];  Service: Orthopedics;  Laterality: Right;   ESOPHAGOGASTRODUODENOSCOPY (EGD) WITH PROPOFOL N/A 09/29/2019   Procedure: ESOPHAGOGASTRODUODENOSCOPY (EGD) WITH PROPOFOL;  Surgeon: Kerin Salen, MD;  Location: [facility];  Service: Gastroenterology;  Laterality: N/A;   EYE SURGERY  05/25/1993   rk (laser surgery), semi cornea transplant, detacted retina,  fluid removal   HYSTEROSCOPY WITH D & C N/A 12/09/2020   Procedure: DILATATION AND CURETTAGE /HYSTEROSCOPY;  Surgeon: Steva Ready, DO;  Location:  S[facility]ERY [facility];  Service: Gynecology;  Laterality: N/A;   KNEE ARTHROSCOPY Left 03/03/2004   POLYPECTOMY  09/29/2019   Procedure: POLYPECTOMY;  Surgeon: Kerin Salen, MD;  Location: [facility];  Service: Gastroenterology;;   POSTERIOR VITRECTOMY RIGHT EYE AND LASER   10/27/1999   SHOULDER SURGERY Right 05/26/1995   SKIN BIOPSY  05/2022   lower stomach   SPINAL FIXATION SURGERY  W/ IMPLANT  2013 rod #1//  2014  rod 2   S1 -- T10  (rod #1)//   S1 -- T4 (rod #2)   THORACIC FUSION  03/13/2013   REMOVAL HARDWARE/  BONE GRAFT FUSION T10//  REVISION OF RODS   TOTAL KNEE ARTHROPLASTY Left 12/14/2005   UVULOPALATOPHARYNGOPLASTY  04/26/2006   w/  TONSILLECTOMY/  TURBINATE REDUCTIONS/  BILATERAL ANTERIOR ETHMOIDECTOMY   Current Outpatient Medications on File Prior to Visit  Medication Sig Dispense Refill   azithromycin (ZITHROMAX) 250 MG tablet 2 tabs poqday1, 1 tab  poqday 2-5 6 tablet 0   buPROPion (WELLBUTRIN XL) 300 MG 24 hr tablet Take 300 mg by mouth every morning.  1   ciprofloxacin (CILOXAN) 0.3 % ophthalmic solution Place 2 drops into the right eye every 2 (two) hours. Administer 1 drop, every 2 hours, while awake, for 2 days. Then 1 drop, every 4 hours, while awake, for the next 5 days. (Patient taking differently: Place 2 drops into the right eye as needed. Administer 1 drop, every 2 hours, while awake, for 2 days. Then 1 drop, every 4 hours, while awake, for the next 5 days.) 5 mL 0   clonazePAM (KLONOPIN) 1 MG tablet TAKE 1 TABLET BY MOUTH EVERY DAY AS NEEDED FOR ANXIETY (Patient taking differently: Take 1 mg by mouth daily as needed for anxiety.) 30 tablet 1   dexlansoprazole (DEXILANT) 60 MG capsule TAKE 1 CAPSULE BY MOUTH EVERY DAY 90 capsule 1   diazepam (VALIUM) 5 MG tablet Take 2 tablets (10 mg total) by mouth every 8 (eight) hours as needed for anxiety. 30 tablet 0   doxazosin (CARDURA) 2 MG tablet Take 1 tablet (2 mg total) by mouth daily. 90 tablet 1   doxepin (SINEQUAN) 50 MG capsule Take 50 mg by mouth.     ezetimibe (ZETIA) 10 MG tablet TAKE 1 TABLET BY MOUTH EVERY DAY 90 tablet 2   finasteride (PROSCAR) 5 MG tablet Take 0.5 tablets (2.5 mg total) by mouth daily. 90 tablet 1   FLUoxetine (PROZAC) 20 MG capsule Take 20 mg by mouth 3 (three) times daily.     fluticasone (FLONASE) 50 MCG/ACT nasal spray Place 2 sprays into both nostrils daily as needed for allergies. 16 g 11   hydrochlorothiazide (HYDRODIURIL) 25 MG tablet TAKE 1 TABLET (25 MG TOTAL) BY MOUTH DAILY. 90 tablet 3   hydrocortisone cream 1 % Apply 1 Application topically 2 (two) times daily. 30 g 0   INGREZZA 80 MG capsule Take one capsule by mouth daily 30 capsule 1   leflunomide (ARAVA) 20 MG tablet TAKE 1 TABLET BY MOUTH EVERY DAY 90 tablet 0   linaclotide (LINZESS) 72 MCG capsule Take 1 capsule (72 mcg total) by mouth daily before breakfast. (Patient taking differently: Take  72 mcg by mouth as needed.) 90 capsule 3   metoprolol succinate (TOPROL-XL) 50 MG 24 hr tablet TAKE 1 TABLET BY MOUTH ONCE DAILY FOLLOWING A MEAL 90 tablet 1   ondansetron (ZOFRAN) 4 MG tablet Take 4 mg by mouth 4 (four) times daily as needed for nausea.     oxyCODONE-acetaminophen (PERCOCET) 5-325 MG tablet Take 1 tablet by mouth every 8 (eight) hours as needed for severe pain. 60 tablet 0   predniSONE (DELTASONE) 20 MG tablet 3 tabs poqday 1-2, 2 tabs poqday 3-4, 1 tab poqday 5-6 12 tablet 0   pregabalin (LYRICA) 100 MG capsule TAKE 1 CAPSULE BY MOUTH TWICE A DAY 60 capsule 5  pregabalin (LYRICA) 75 MG capsule Take 1 capsule (75 mg total) by mouth 2 (two) times daily. 60 capsule 0   tiZANidine (ZANAFLEX) 4 MG capsule Take 1 capsule (4 mg total) by mouth 3 (three) times daily. (Patient taking differently: Take 4 mg by mouth as needed.) 30 capsule 1   valbenazine (INGREZZA) 80 MG capsule Take 1 capsule (80 mg total) by mouth daily. 90 capsule 3   valsartan (DIOVAN) 320 MG tablet TAKE 1 TABLET BY MOUTH EVERY DAY 90 tablet 3   No current facility-administered medications on file prior to visit.   Allergies  Allergen Reactions   Latex Rash    And Mouth sores Other reaction(s): rash Other reaction(s): rash Other reaction(s): rash   Amlodipine Besy-Benazepril Hcl Other (See Comments)    COUGH   Amoxicillin-Pot Clavulanate Diarrhea and Nausea And Vomiting    Other reaction(s): sick on stomach Other reaction(s): sick on stomach   Bextra [Valdecoxib] Diarrhea and Nausea And Vomiting    REFLUX   Chlorhexidine     rash Other reaction(s): rash all over body Other reaction(s): rash all over body   Lipitor [Atorvastatin Calcium] Other (See Comments)    MYALGIAS   Sulfa Antibiotics Nausea And Vomiting    CRAMPING Other reaction(s): stomach hurts Other reaction(s): stomach hurts   Zocor [Simvastatin] Other (See Comments)    MYALGIA        Objective:    BP (!) 160/100   Pulse 82    Temp 98.7 F (37.1 C) (Oral)   Ht 5\' 3"  (1.6 m)   Wt 196 lb (88.9 kg)   SpO2 95%   BMI 34.72 kg/m    Physical Exam Vitals and nursing note reviewed.  Constitutional:      Appearance: Normal appearance. She is normal weight.  HENT:     Head: Normocephalic and atraumatic.     Right Ear: Tympanic membrane, ear canal and external ear normal.     Left Ear: Tympanic membrane, ear canal and external ear normal.     Nose: Congestion present.     Right Sinus: Maxillary sinus tenderness present.     Left Sinus: Maxillary sinus tenderness present.     Mouth/Throat:     Pharynx: Oropharynx is clear.  Eyes:     Conjunctiva/sclera: Conjunctivae normal.  Pulmonary:     Effort: Pulmonary effort is normal.     Breath sounds: Normal breath sounds.  Skin:    General: Skin is warm and dry.  Neurological:     General: No focal deficit present.     Mental Status: She is alert and oriented to person, place, and time. Mental status is at baseline.  Psychiatric:        Mood and Affect: Mood normal.        Behavior: Behavior normal.        Thought Content: Thought content normal.        Judgment: Judgment normal.     No results found for any visits on 10/13/22.      Assessment & Plan:   Problem List Items Addressed This Visit     Hypertension    BP elevated in office today, likely due to acute illness, instructed to monitor at home and if remains elevated to schedule appointment with PCP.      Subacute maxillary sinusitis - Primary    Patient's symptoms are consistent with ongoing sinusitis that is unrelieved with partial course of Augmentin and full course of Z-Pak. Will try Doycycline 100mg   BID for 7 days. Provided Albuterol inhaler for shortness of breath and wheezing. Encouraged her to resume her home allergy medications as this may be contributing. Can also try Flonase for nasal congestion. Return to office if symptoms persist or worsen,      Relevant Medications   doxycycline  (VIBRA-TABS) 100 MG tablet    Meds ordered this encounter  Medications   albuterol (VENTOLIN HFA) 108 (90 Base) MCG/ACT inhaler    Sig: Inhale 2 puffs into the lungs every 6 (six) hours as needed for wheezing or shortness of breath.    Dispense:  8 g    Refill:  0    Order Specific Question:   Supervising Provider    Answer:   Lynnea Ferrier T [[id number]]   doxycycline (VIBRA-TABS) 100 MG tablet    Sig: Take 1 tablet (100 mg total) by mouth 2 (two) times daily.    Dispense:  14 tablet    Refill:  0    Order Specific Question:   Supervising Provider    Answer:   Lynnea Ferrier T [[id number]]    Return if symptoms worsen or fail to improve.  Park Meo, FNP

## 2022-10-13 NOTE — Assessment & Plan Note (Signed)
Patient's symptoms are consistent with ongoing sinusitis that is unrelieved with partial course of Augmentin and full course of Z-Pak. Will try Doycycline 100mg  BID for 7 days. Provided Albuterol inhaler for shortness of breath and wheezing. Encouraged her to resume her home allergy medications as this may be contributing. Can also try Flonase for nasal congestion. Return to office if symptoms persist or worsen,

## 2022-10-27 ENCOUNTER — Encounter: Payer: Self-pay | Admitting: Family Medicine

## 2022-10-27 ENCOUNTER — Ambulatory Visit (INDEPENDENT_AMBULATORY_CARE_PROVIDER_SITE_OTHER): Payer: Medicare Other | Admitting: Family Medicine

## 2022-10-27 VITALS — BP 142/96 | HR 81 | Temp 98.5°F | Ht 63.0 in | Wt 196.4 lb

## 2022-10-27 DIAGNOSIS — R0602 Shortness of breath: Secondary | ICD-10-CM | POA: Diagnosis not present

## 2022-10-27 DIAGNOSIS — Z23 Encounter for immunization: Secondary | ICD-10-CM

## 2022-10-27 DIAGNOSIS — I10 Essential (primary) hypertension: Secondary | ICD-10-CM

## 2022-10-27 DIAGNOSIS — R0609 Other forms of dyspnea: Secondary | ICD-10-CM | POA: Insufficient documentation

## 2022-10-27 MED ORDER — DOXAZOSIN MESYLATE 4 MG PO TABS: 4.0000 mg | ORAL_TABLET | Freq: Every day | ORAL | 1 refills | Status: DC

## 2022-10-27 NOTE — Assessment & Plan Note (Signed)
Chronic, uncontrolled on Valsartan 320mg  daily, metoprolol 50mg  daily, and cardura 2mg  daily. Will increase Cardura to 4mg  daily and recheck in 2 weeks. Continue to monitor at home and seek medical care if sustains >180/120, chest pain, worsening shortness of breath, vision changes, recurrent headaches.

## 2022-10-27 NOTE — Addendum Note (Signed)
Addended by: Venia Carbon K on: 10/27/2022 12:07 PM   Modules accepted: Orders

## 2022-10-27 NOTE — Assessment & Plan Note (Signed)
Chronic, worse at night, PE ruled out, will obtain BNP today given symptoms and associated LE edema.

## 2022-10-27 NOTE — Progress Notes (Signed)
Subjective:  HPI: Janet Peterson is a 72 y.o. female presenting on 10/27/2022 for Follow-up (Here for BP follow up. 150-170/100-110 at home. No dizziness/blurred vision or headache per pt. )   HPI Patient is in today for elevations in blood pressure. Her BP at home has been 150-170/100-110. Her HCTZ was recently discontinued by her PCP but she is unsure why. She is currently taking Valsartan 320mg  daily, Metoprolol 50mg  daily, and Cardura 2mg  daily. She also reports shortness of breath that has been ongoing for a few years and intermittent leg swelling, DVT was ruled out at her recent OV, she did also recently get over a sinus infection. She denies chest pain, vision changes, recurrent headaches. Is compliant with her medications.  Review of Systems  All other systems reviewed and are negative.   Relevant past medical history reviewed and updated as indicated.   Past Medical History:  Diagnosis Date   Ankylosing spondylitis (HCC)    Anxiety and depression    Depression    Fibromyalgia    GERD (gastroesophageal reflux disease)    Hiatal hernia    per patient, dx by GI    History of basal cell carcinoma excision    FACE, 1992  &  1996   History of hiatal hernia    History of thrush    Hyperlipidemia    Hypertension    Insomnia    Left breast mass    OCD (obsessive compulsive disorder)    Osteopenia    PONV (postoperative nausea and vomiting)    Psoriatic arthritis (HCC)    PVC (premature ventricular contraction)    Rheumatoid arthritis (HCC)      Past Surgical History:  Procedure Laterality Date   BIOPSY  09/29/2019   Procedure: BIOPSY;  Surgeon: Kerin Salen, MD;  Location: [facility];  Service: Gastroenterology;;   BREAST EXCISIONAL BIOPSY Right    BREAST EXCISIONAL BIOPSY Left 05/04/2018   BREAST LUMPECTOMY WITH RADIOACTIVE SEED LOCALIZATION Left 05/04/2018   Procedure: LEFT BREAST LUMPECTOMY WITH RADIOACTIVE SEED LOCALIZATION AND LEFT BREAST NIPPLE BIOPSY;  Surgeon:  Griselda Miner, MD;  Location: [facility];  Service: General;  Laterality: Left;   BREAST SURGERY  05/25/1973   lumpectomy-- benign   BUNIONECTOMY  05/25/1989   CATARACT EXTRACTION W/ INTRAOCULAR LENS  IMPLANT, BILATERAL  05/25/1998   CERVICAL FUSION  07/24/2011   C5 -- C7   CESAREAN SECTION  05/26/1983   COLONOSCOPY WITH PROPOFOL N/A 09/29/2019   Procedure: COLONOSCOPY WITH PROPOFOL;  Surgeon: Kerin Salen, MD;  Location: [facility];  Service: Gastroenterology;  Laterality: N/A;   DISTAL INTERPHALANGEAL JOINT FUSION Right 03/14/2015   Procedure: RIGHT LONG FINGER DISTAL INTERPHALANGEAL JOINT ARTHRODESIS;  Surgeon: Bradly Bienenstock, MD;  Location[facility];  Service: Orthopedics;  Laterality: Right;   ESOPHAGOGASTRODUODENOSCOPY (EGD) WITH PROPOFOL N/A 09/29/2019   Procedure: ESOPHAGOGASTRODUODENOSCOPY (EGD) WITH PROPOFOL;  Surgeon: Kerin Salen, MD;  Location: [facility];  Service: Gastroenterology;  Laterality: N/A;   EYE SURGERY  05/25/1993   rk (laser surgery), semi cornea transplant, detacted retina,  fluid removal   HYSTEROSCOPY WITH D & C N/A 12/09/2020   Procedure: DILATATION AND CURETTAGE /HYSTEROSCOPY;  Surgeon: Steva Ready, DO;  Location: [facility];  Service: Gynecology;  Laterality: N/A;   KNEE ARTHROSCOPY Left 03/03/2004   POLYPECTOMY  09/29/2019   Procedure: POLYPECTOMY;  Surgeon: Kerin Salen, MD;  Location: [facility];  Service: Gastroenterology;;   POSTERIOR VITRECTOMY RIGHT EYE AND LASER  10/27/1999   SHOULDER SURGERY Right 05/26/1995   SKIN BIOPSY  05/2022   lower stomach   SPINAL FIXATION SURGERY W/ IMPLANT  2013 rod #1//  2014  rod 2   S1 -- T10  (rod #1)//   S1 -- T4 (rod #2)   THORACIC FUSION  03/13/2013   REMOVAL HARDWARE/  BONE GRAFT FUSION T10//  REVISION OF RODS   TOTAL KNEE ARTHROPLASTY Left 12/14/2005   UVULOPALATOPHARYNGOPLASTY  04/26/2006   w/  TONSILLECTOMY/  TURBINATE REDUCTIONS/  BILATERAL ANTERIOR  ETHMOIDECTOMY    Allergies and medications reviewed and updated.   Current Outpatient Medications:    albuterol (VENTOLIN HFA) 108 (90 Base) MCG/ACT inhaler, Inhale 2 puffs into the lungs every 6 (six) hours as needed for wheezing or shortness of breath., Disp: 8 g, Rfl: 0   azithromycin (ZITHROMAX) 250 MG tablet, 2 tabs poqday1, 1 tab poqday 2-5, Disp: 6 tablet, Rfl: 0   buPROPion (WELLBUTRIN XL) 300 MG 24 hr tablet, Take 300 mg by mouth every morning., Disp: , Rfl: 1   ciprofloxacin (CILOXAN) 0.3 % ophthalmic solution, Place 2 drops into the right eye every 2 (two) hours. Administer 1 drop, every 2 hours, while awake, for 2 days. Then 1 drop, every 4 hours, while awake, for the next 5 days. (Patient taking differently: Place 2 drops into the right eye as needed. Administer 1 drop, every 2 hours, while awake, for 2 days. Then 1 drop, every 4 hours, while awake, for the next 5 days.), Disp: 5 mL, Rfl: 0   clonazePAM (KLONOPIN) 1 MG tablet, TAKE 1 TABLET BY MOUTH EVERY DAY AS NEEDED FOR ANXIETY (Patient taking differently: Take 1 mg by mouth daily as needed for anxiety.), Disp: 30 tablet, Rfl: 1   dexlansoprazole (DEXILANT) 60 MG capsule, TAKE 1 CAPSULE BY MOUTH EVERY DAY, Disp: 90 capsule, Rfl: 1   diazepam (VALIUM) 5 MG tablet, Take 2 tablets (10 mg total) by mouth every 8 (eight) hours as needed for anxiety., Disp: 30 tablet, Rfl: 0   doxepin (SINEQUAN) 50 MG capsule, Take 50 mg by mouth., Disp: , Rfl:    doxycycline (VIBRA-TABS) 100 MG tablet, Take 1 tablet (100 mg total) by mouth 2 (two) times daily., Disp: 14 tablet, Rfl: 0   ezetimibe (ZETIA) 10 MG tablet, TAKE 1 TABLET BY MOUTH EVERY DAY, Disp: 90 tablet, Rfl: 2   finasteride (PROSCAR) 5 MG tablet, Take 0.5 tablets (2.5 mg total) by mouth daily., Disp: 90 tablet, Rfl: 1   FLUoxetine (PROZAC) 20 MG capsule, Take 20 mg by mouth 3 (three) times daily., Disp: , Rfl:    fluticasone (FLONASE) 50 MCG/ACT nasal spray, Place 2 sprays into both  nostrils daily as needed for allergies., Disp: 16 g, Rfl: 11   hydrochlorothiazide (HYDRODIURIL) 25 MG tablet, TAKE 1 TABLET (25 MG TOTAL) BY MOUTH DAILY., Disp: 90 tablet, Rfl: 3   hydrocortisone cream 1 %, Apply 1 Application topically 2 (two) times daily., Disp: 30 g, Rfl: 0   INGREZZA 80 MG capsule, Take one capsule by mouth daily, Disp: 30 capsule, Rfl: 1   leflunomide (ARAVA) 20 MG tablet, TAKE 1 TABLET BY MOUTH EVERY DAY, Disp: 90 tablet, Rfl: 0   linaclotide (LINZESS) 72 MCG capsule, Take 1 capsule (72 mcg total) by mouth daily before breakfast. (Patient taking differently: Take 72 mcg by mouth as needed.), Disp: 90 capsule, Rfl: 3   metoprolol succinate (TOPROL-XL) 50 MG 24 hr tablet, TAKE 1 TABLET BY MOUTH ONCE DAILY FOLLOWING A  MEAL, Disp: 90 tablet, Rfl: 1   ondansetron (ZOFRAN) 4 MG tablet, Take 4 mg by mouth 4 (four) times daily as needed for nausea., Disp: , Rfl:    oxyCODONE-acetaminophen (PERCOCET) 5-325 MG tablet, Take 1 tablet by mouth every 8 (eight) hours as needed for severe pain., Disp: 60 tablet, Rfl: 0   predniSONE (DELTASONE) 20 MG tablet, 3 tabs poqday 1-2, 2 tabs poqday 3-4, 1 tab poqday 5-6, Disp: 12 tablet, Rfl: 0   pregabalin (LYRICA) 100 MG capsule, TAKE 1 CAPSULE BY MOUTH TWICE A DAY, Disp: 60 capsule, Rfl: 5   pregabalin (LYRICA) 75 MG capsule, Take 1 capsule (75 mg total) by mouth 2 (two) times daily., Disp: 60 capsule, Rfl: 0   tiZANidine (ZANAFLEX) 4 MG capsule, Take 1 capsule (4 mg total) by mouth 3 (three) times daily. (Patient taking differently: Take 4 mg by mouth as needed.), Disp: 30 capsule, Rfl: 1   valbenazine (INGREZZA) 80 MG capsule, Take 1 capsule (80 mg total) by mouth daily., Disp: 90 capsule, Rfl: 3   valsartan (DIOVAN) 320 MG tablet, TAKE 1 TABLET BY MOUTH EVERY DAY, Disp: 90 tablet, Rfl: 3   doxazosin (CARDURA) 4 MG tablet, Take 1 tablet (4 mg total) by mouth daily., Disp: 30 tablet, Rfl: 1  Allergies  Allergen Reactions   Latex Rash    And  Mouth sores Other reaction(s): rash Other reaction(s): rash Other reaction(s): rash   Amlodipine Besy-Benazepril Hcl Other (See Comments)    COUGH   Amoxicillin-Pot Clavulanate Diarrhea and Nausea And Vomiting    Other reaction(s): sick on stomach Other reaction(s): sick on stomach   Bextra [Valdecoxib] Diarrhea and Nausea And Vomiting    REFLUX   Chlorhexidine     rash Other reaction(s): rash all over body Other reaction(s): rash all over body   Lipitor [Atorvastatin Calcium] Other (See Comments)    MYALGIAS   Sulfa Antibiotics Nausea And Vomiting    CRAMPING Other reaction(s): stomach hurts Other reaction(s): stomach hurts   Zocor [Simvastatin] Other (See Comments)    MYALGIA    Objective:   BP (!) 142/96   Pulse 81   Temp 98.5 F (36.9 C)   Ht 5\' 3"  (1.6 m)   Wt 196 lb 6.4 oz (89.1 kg)   SpO2 100%   BMI 34.79 kg/m      10/27/2022   10:58 AM 10/13/2022   11:57 AM 10/13/2022   11:53 AM  Vitals with BMI  Height 5\' 3"   5\' 3"   Weight 196 lbs 6 oz  196 lbs  BMI 34.8  34.73  Systolic 142 160 811  Diastolic 96 100 100  Pulse 81  82     Physical Exam Vitals and nursing note reviewed.  Constitutional:      Appearance: Normal appearance. She is normal weight.  HENT:     Head: Normocephalic and atraumatic.  Cardiovascular:     Rate and Rhythm: Normal rate and regular rhythm.     Pulses: Normal pulses.     Heart sounds: Normal heart sounds.  Pulmonary:     Effort: Pulmonary effort is normal.     Breath sounds: Normal breath sounds.  Musculoskeletal:     Right lower leg: Edema present.     Left lower leg: Edema present.  Skin:    General: Skin is warm and dry.  Neurological:     General: No focal deficit present.     Mental Status: She is alert and oriented to person, place, and  time. Mental status is at baseline.  Psychiatric:        Mood and Affect: Mood normal.        Behavior: Behavior normal.        Thought Content: Thought content normal.         Judgment: Judgment normal.     Assessment & Plan:  Primary hypertension Assessment & Plan: Chronic, uncontrolled on Valsartan 320mg  daily, metoprolol 50mg  daily, and cardura 2mg  daily. Will increase Cardura to 4mg  daily and recheck in 2 weeks. Continue to monitor at home and seek medical care if sustains >180/120, chest pain, worsening shortness of breath, vision changes, recurrent headaches.    SOB (shortness of breath) Assessment & Plan: Chronic, worse at night, PE ruled out, will obtain BNP today given symptoms and associated LE edema.  Orders: -     Brain natriuretic peptide  Other orders -     Doxazosin Mesylate; Take 1 tablet (4 mg total) by mouth daily.  Dispense: 30 tablet; Refill: 1     Follow up plan: Return in about 2 weeks (around 11/10/2022) for hypertension.  Park Meo, FNP

## 2022-10-27 NOTE — Progress Notes (Unsigned)
Office Visit Note  Patient: Janet Peterson             Date of Birth: 02/05/1951           MRN: [id number]             PCP: Donita Brooks, MD Referring: Donita Brooks, MD Visit Date: 11/10/2022 Occupation: @GUAROCC @  Subjective:  Medication monitoring   History of Present Illness: Janet Peterson is a 72 y.o. female with history of psoriatic arthritis.  Patient is taking arava 20 mg 1 tablet by mouth daily.  She is tolerating Arava without any side effects.  Patient was recently on a cruise and while traveling she developed sinusitis.  She was treated with a course of antibiotics at which time she held arava.  She denies any recurrent infections. She denies any active psoriasis at this time.  She denies any SI joint discomfort.  She denies any Achilles tendinitis or plantar fasciitis.  She has occasional discomfort and stiffness in both knee joints especially when climbing steps.  She also has occasional discomfort in her hands but denies any joint swelling. Patient states that she has been experiencing some increased shortness of breath.  She has a follow up visit tomorrow with cardiology.   Activities of Daily Living:  Patient reports morning stiffness for 1 hour.   Patient Reports nocturnal pain.  Difficulty dressing/grooming: Denies Difficulty climbing stairs: Reports Difficulty getting out of chair: Reports Difficulty using hands for taps, buttons, cutlery, and/or writing: Reports  Review of Systems  Constitutional:  Positive for fatigue.  HENT:  Negative for mouth sores and mouth dryness.   Eyes:  Negative for dryness.  Respiratory:  Positive for shortness of breath.        Scheduled with cardiology   Cardiovascular:  Positive for swelling in legs/feet. Negative for chest pain and palpitations.  Gastrointestinal:  Negative for blood in stool, constipation and diarrhea.  Endocrine: Negative for increased urination.  Genitourinary:  Negative for involuntary urination.   Musculoskeletal:  Positive for joint pain, gait problem, joint pain, joint swelling, myalgias, muscle weakness, morning stiffness, muscle tenderness and myalgias.  Skin:  Negative for color change, rash and sensitivity to sunlight.  Allergic/Immunologic: Positive for susceptible to infections.  Neurological:  Negative for dizziness and headaches.  Hematological:  Negative for swollen glands.  Psychiatric/Behavioral:  Negative for depressed mood and sleep disturbance. The patient is not nervous/anxious.     PMFS History:  Patient Active Problem List   Diagnosis Date Noted   SOB (shortness of breath) 10/27/2022   Subacute maxillary sinusitis 10/13/2022   Thickened endometrium 07/30/2022   History of adenomatous polyp of colon 07/30/2022   Bacterial conjunctivitis of right eye 04/30/2022   Contact dermatitis 04/30/2022   Lesion of liver 06/20/2021   C. difficile colitis 09/29/2019   Colitis 09/28/2019   Anxiety and depression 09/28/2019   DDD (degenerative disc disease), cervical s/p fusion 11/12/2016   H/O total knee replacement, left 05/06/2016   DDD lumbar spine status post fusion 05/06/2016   Psoriasis 05/05/2016   High risk medication use 05/05/2016   Cervical post-laminectomy syndrome 12/09/2015   Thoracic postlaminectomy syndrome 12/09/2015   Psoriatic arthritis (HCC) 08/23/2012   Osteopenia 08/23/2012   HLD (hyperlipidemia) 08/23/2012   RLS (restless legs syndrome) 08/23/2012   Insomnia 08/23/2012   Fibromyalgia syndrome 08/12/2012   Low back pain 08/12/2012   Syncope    PVC (premature ventricular contraction)    OCD (obsessive compulsive disorder)  GERD (gastroesophageal reflux disease)    Hypertension     Past Medical History:  Diagnosis Date   Ankylosing spondylitis (HCC)    Anxiety and depression    Depression    Fibromyalgia    GERD (gastroesophageal reflux disease)    Hiatal hernia    per patient, dx by GI    History of basal cell carcinoma excision     FACE, 1992  &  1996   History of hiatal hernia    History of thrush    Hyperlipidemia    Hypertension    Insomnia    Left breast mass    OCD (obsessive compulsive disorder)    Osteopenia    PONV (postoperative nausea and vomiting)    Psoriatic arthritis (HCC)    PVC (premature ventricular contraction)    Rheumatoid arthritis (HCC)     Family History  Problem Relation Age of Onset   Diabetes Mother    Heart disease Mother    Diabetes Father    Anuerysm Father    Diabetes Brother    Heart disease Brother    Diabetes Brother    Heart disease Brother    Breast cancer Neg Hx    Past Surgical History:  Procedure Laterality Date   BIOPSY  09/29/2019   Procedure: BIOPSY;  Surgeon: Kerin Salen, MD;  Location: [facility];  Service: Gastroenterology;;   BREAST EXCISIONAL BIOPSY Right    BREAST EXCISIONAL BIOPSY Left 05/04/2018   BREAST LUMPECTOMY WITH RADIOACTIVE SEED LOCALIZATION Left 05/04/2018   Procedure: LEFT BREAST LUMPECTOMY WITH RADIOACTIVE SEED LOCALIZATION AND LEFT BREAST NIPPLE BIOPSY;  Surgeon: Griselda Miner, MD;  Location: [facility];  Service: General;  Laterality: Left;   BREAST SURGERY  05/25/1973   lumpectomy-- benign   BUNIONECTOMY  05/25/1989   CATARACT EXTRACTION W/ INTRAOCULAR LENS  IMPLANT, BILATERAL  05/25/1998   CERVICAL FUSION  07/24/2011   C5 -- C7   CESAREAN SECTION  05/26/1983   COLONOSCOPY WITH PROPOFOL N/A 09/29/2019   Procedure: COLONOSCOPY WITH PROPOFOL;  Surgeon: Kerin Salen, MD;  Location: [facility];  Service: Gastroenterology;  Laterality: N/A;   DISTAL INTERPHALANGEAL JOINT FUSION Right 03/14/2015   Procedure: RIGHT LONG FINGER DISTAL INTERPHALANGEAL JOINT ARTHRODESIS;  Surgeon: Bradly Bienenstock, MD;  Location: [facility];  Service: Orthopedics;  Laterality: Right;   ESOPHAGOGASTRODUODENOSCOPY (EGD) WITH PROPOFOL N/A 09/29/2019   Procedure: ESOPHAGOGASTRODUODENOSCOPY (EGD) WITH PROPOFOL;  Surgeon: Kerin Salen,  MD;  Location: [facility];  Service: Gastroenterology;  Laterality: N/A;   EYE SURGERY  05/25/1993   rk (laser surgery), semi cornea transplant, detacted retina,  fluid removal   HYSTEROSCOPY WITH D & C N/A 12/09/2020   Procedure: DILATATION AND CURETTAGE /HYSTEROSCOPY;  Surgeon: Steva Ready, DO;  Location:  SURGERY CENTER;  Service: Gynecology;  Laterality: N/A;   KNEE ARTHROSCOPY Left 03/03/2004   POLYPECTOMY  09/29/2019   Procedure: POLYPECTOMY;  Surgeon: Kerin Salen, MD;  Location: [facility];  Service: Gastroenterology;;   POSTERIOR VITRECTOMY RIGHT EYE AND LASER   10/27/1999   SHOULDER SURGERY Right 05/26/1995   SKIN BIOPSY  05/2022   lower stomach   SPINAL FIXATION SURGERY W/ IMPLANT  2013 rod #1//  2014  rod 2   S1 -- T10  (rod #1)//   S1 -- T4 (rod #2)   THORACIC FUSION  03/13/2013   REMOVAL HARDWARE/  BONE GRAFT FUSION T10//  REVISION OF RODS   TOTAL KNEE ARTHROPLASTY Left 12/14/2005   UVULOPALATOPHARYNGOPLASTY  04/26/2006  w/  TONSILLECTOMY/  TURBINATE REDUCTIONS/  BILATERAL ANTERIOR ETHMOIDECTOMY   Social History   Social History Narrative   Lives with husband   Immunization History  Administered Date(s) Administered   Fluad Quad(high Dose 65+) 01/26/2019, 03/05/2020, 03/18/2022   Influenza Whole 03/04/2012   Influenza, High Dose Seasonal PF 03/25/2018   Influenza,inj,Quad PF,6+ Mos 02/28/2013, 03/07/2014, 02/28/2015, 03/27/2016, 03/09/2017   Influenza-Unspecified 04/08/2021   PFIZER(Purple Top)SARS-COV-2 Vaccination 08/10/2019   PNEUMOCOCCAL CONJUGATE-20 10/27/2022   Pneumococcal Conjugate-13 09/10/2016   Pneumococcal Polysaccharide-23 03/12/2006, 11/09/2011   Pneumococcal-Unspecified 08/31/2016   Unspecified SARS-COV-2 Vaccination 08/07/2019, 08/28/2019, 03/08/2020   Zoster Recombinat (Shingrix) 08/12/2021, 11/21/2021     Objective: Vital Signs: BP (!) 160/96 (BP Location: Left Arm, Patient Position: Sitting, Cuff Size: Normal)   Pulse 91    Resp 15   Ht 5\' 3"  (1.6 m)   Wt 196 lb 9.6 oz (89.2 kg)   BMI 34.83 kg/m    Physical Exam Vitals and nursing note reviewed.  Constitutional:      Appearance: She is well-developed.  HENT:     Head: Normocephalic and atraumatic.  Eyes:     Conjunctiva/sclera: Conjunctivae normal.  Cardiovascular:     Rate and Rhythm: Normal rate and regular rhythm.     Heart sounds: Normal heart sounds.  Pulmonary:     Effort: Pulmonary effort is normal.     Breath sounds: Normal breath sounds.  Abdominal:     General: Bowel sounds are normal.     Palpations: Abdomen is soft.  Musculoskeletal:     Cervical back: Normal range of motion.  Lymphadenopathy:     Cervical: No cervical adenopathy.  Skin:    General: Skin is warm and dry.     Capillary Refill: Capillary refill takes less than 2 seconds.  Neurological:     Mental Status: She is alert and oriented to person, place, and time.  Psychiatric:        Behavior: Behavior normal.      Musculoskeletal Exam: C-spine has limited range of motion with lateral rotation.  Trapezius muscle tension and tenderness bilaterally.  Thoracic kyphosis noted.  Some midline spinal tenderness in the lumbar region.  Limited mobility of the lumbar spine.  No SI joint tenderness.  Shoulder joints, elbow joints, wrist joints, MCPs, PIPs, DIPs have good range of motion with no synovitis.  PIP and DIP thickening.  Hip joints have slightly limited range of motion.  Left knee replacement has good range of motion.  Right knee has some discomfort with range of motion but no warmth or effusion.  Ankle joints have good range of motion with no joint tenderness.  CDAI Exam: CDAI Score: -- Patient Global: --; Provider Global: -- Swollen: --; Tender: -- Joint Exam 11/10/2022   No joint exam has been documented for this visit   There is currently no information documented on the homunculus. Go to the Rheumatology activity and complete the homunculus joint  exam.  Investigation: No additional findings.  Imaging: No results found.  Recent Labs: Lab Results  Component Value Date   WBC 6.0 09/29/2022   HGB 11.0 (L) 09/29/2022   PLT 256 09/29/2022   NA 139 09/29/2022   K 3.8 09/29/2022   CL 101 09/29/2022   CO2 29 09/29/2022   GLUCOSE 80 09/29/2022   BUN 8 09/29/2022   CREATININE 0.77 09/29/2022   BILITOT 0.3 09/29/2022   ALKPHOS 118 04/25/2020   AST 12 09/29/2022   ALT 4 (L) 09/29/2022   PROT  5.8 (L) 09/29/2022   ALBUMIN 3.5 04/25/2020   CALCIUM 9.7 09/29/2022   GFRAA 82 09/26/2020   QFTBGOLD Indeterminate 08/14/2016   QFTBGOLDPLUS Negative 12/22/2018    Speciality Comments: Simponi Aria every 8 weeks started Jan 2018  TB gold negative 08/19/16  Procedures:  No procedures performed Allergies: Latex, Amlodipine besy-benazepril hcl, Amoxicillin-pot clavulanate, Bextra [valdecoxib], Chlorhexidine, Lipitor [atorvastatin calcium], Sulfa antibiotics, and Zocor [simvastatin]     Assessment / Plan:     Visit Diagnoses: Psoriatic arthritis (HCC): She has no synovitis or dactylitis on examination today.  She has not had any signs or symptoms of a sort of arthritis flare.  She has clinically been doing well on Arava 20 mg 1 tablet by mouth daily.  She is tolerating Arava without any side effects.  She has no active psoriasis at this time.  No SI joint tenderness upon palpation.  No evidence of Achilles tendinitis or plantar fasciitis.  Overall her symptoms are well-controlled taking Arava as prescribed.  No medication changes will be made at this time.  She is advised to notify us if she develops signs or symptoms of a flare.  She will follow-up in the office in 5 months or sooner if needed.  Psoriasis: No active psoriasis at this time.  High risk medication use - Arava 20 mg 1 tablet by mouth daily.  Previously was on Simponi Aria IV infusion 2 mg/kg every 8 weeks-patient discontinued in March 2021 due to C. difficile. CBC and CMP  updated 09/29/22.  Her next lab work will be due in August and every 3 months.  Discussed the importance of holding arava if she develops signs or symptoms of an infection and to resume once the infection has cleared.    Primary osteoarthritis of both hands: She has PIP and DIP thickening consistent with osteoarthritis of both hands.  No synovitis or dactylitis noted.  Primary osteoarthritis of right knee - Visco right knee  June/July 2023.  She had good response to viscosupplement injections.  She has not yet ready to proceed with repeat Visco gel injections at this time.  She experiences intermittent discomfort and stiffness in the right knee especially when climbing steps.  On examination today she has good range of motion with no warmth or effusion.  She will notify us when she would like to reapply for viscosupplementation for the right knee.  H/O total knee replacement, left: Doing well.  Some discomfort and stiffness while climbing steps.  DDD (degenerative disc disease), cervical s/p fusion: Limited range of motion with lateral rotation.  DDD (degenerative disc disease), thoracic: Thoracic kyphosis noted.  DDD lumbar spine status post fusion: Limited mobility.  Some midline spinal tenderness in the lumbar region.  Fibromyalgia: Generalized hyperalgesia and positive tender points noted on examination today.  She has ongoing trapezius muscle tension tenderness bilaterally.  She remains on Lyrica as prescribed and takes Percocet as needed for pain relief.  She takes tizanidine 4 mg daily as needed for muscle spasms.  Other fatigue: Chronic, stable.  Primary insomnia: Discussed the importance of good sleep hygiene.  Osteopenia of multiple sites - DEXA scan from April 02 2021 DEXA showed T score -1.3.  BMD 0.857 in the left femoral neck. Due to update DEXA November 2024.  Other medical conditions are listed as follows:  History of gastroesophageal reflux (GERD)  History of  hyperlipidemia  History of hypertension: Blood pressure was elevated today in the office.  Patient is scheduled for a follow-up visit with cardiology  tomorrow for further evaluation.  History of OCD (obsessive compulsive disorder)  RLS (restless legs syndrome)  History of migraine  Orders: No orders of the defined types were placed in this encounter.  No orders of the defined types were placed in this encounter.    Follow-Up Instructions: Return in about 5 months (around 04/12/2023) for Psoriatic arthritis.   Gearldine Bienenstock, PA-C  Note - This record has been created using Dragon software.  Chart creation errors have been sought, but may not always  have been located. Such creation errors do not reflect on  the standard of medical care.

## 2022-10-28 LAB — BRAIN NATRIURETIC PEPTIDE: Brain Natriuretic Peptide: 137 pg/mL — ABNORMAL HIGH (ref <100)

## 2022-10-29 ENCOUNTER — Other Ambulatory Visit: Payer: Self-pay | Admitting: Family Medicine

## 2022-10-29 DIAGNOSIS — R0602 Shortness of breath: Secondary | ICD-10-CM

## 2022-11-05 ENCOUNTER — Telehealth: Payer: Self-pay

## 2022-11-05 NOTE — Telephone Encounter (Signed)
Prescription Request  11/05/2022  LOV: 09/29/22 UPCOMING APPT 11/09/22  What is the name of the medication or equipment? oxyCODONE-acetaminophen (PERCOCET) 5-325 MG tablet  Have you contacted your pharmacy to request a refill? No   Which pharmacy would you like this sent to?  [id number] [place], [place] - [address] [place] [address] Phone: [phone] Fax: [phone]    Patient notified that their request is being sent to the clinical staff for review and that they should receive a response within 2 business days.   Please advise at [facility] [phone]

## 2022-11-06 ENCOUNTER — Other Ambulatory Visit: Payer: Self-pay | Admitting: Family Medicine

## 2022-11-06 MED ORDER — OXYCODONE-ACETAMINOPHEN 5-325 MG PO TABS: 1.0000 | ORAL_TABLET | Freq: Three times a day (TID) | ORAL | 0 refills | Status: DC | PRN

## 2022-11-09 ENCOUNTER — Ambulatory Visit (INDEPENDENT_AMBULATORY_CARE_PROVIDER_SITE_OTHER): Payer: Medicare Other | Admitting: Family Medicine

## 2022-11-09 ENCOUNTER — Encounter: Payer: Self-pay | Admitting: Family Medicine

## 2022-11-09 ENCOUNTER — Other Ambulatory Visit: Payer: Self-pay | Admitting: Family Medicine

## 2022-11-09 VITALS — BP 164/96 | HR 84 | Temp 98.1°F | Ht 63.0 in | Wt 197.4 lb

## 2022-11-09 DIAGNOSIS — I10 Essential (primary) hypertension: Secondary | ICD-10-CM | POA: Diagnosis not present

## 2022-11-09 MED ORDER — AMLODIPINE BESYLATE 10 MG PO TABS: 10.0000 mg | ORAL_TABLET | Freq: Every day | ORAL | 3 refills | Status: DC

## 2022-11-09 NOTE — Progress Notes (Signed)
Subjective:    Patient ID: Janet Peterson, female    DOB: [birth year], 72 y.o.   MRN: [id number]  Patient is currently on valsartan, metoprolol, and doxazosin.  Her blood pressure has been between 150 and as high as 180 systolic.  We verified her cuff for accuracy today at the clinic.  She is having some dyspnea on exertion and she has an appointment to see cardiology coming up however her blood pressure remains high.  She is not on hydrochlorothiazide.  We had to stop this medication due to hyponatremia in January.  She is not on amlodipine because of an allergy however the allergy list to the Lotrel and the allergy was a cough which is almost certainly due to the benazepril. Past Medical History:  Diagnosis Date   Ankylosing spondylitis (HCC)    Anxiety and depression    Depression    Fibromyalgia    GERD (gastroesophageal reflux disease)    Hiatal hernia    per patient, dx by GI    History of basal cell carcinoma excision    FACE, 1992  &  1996   History of hiatal hernia    History of thrush    Hyperlipidemia    Hypertension    Insomnia    Left breast mass    OCD (obsessive compulsive disorder)    Osteopenia    PONV (postoperative nausea and vomiting)    Psoriatic arthritis (HCC)    PVC (premature ventricular contraction)    Rheumatoid arthritis (HCC)    Past Surgical History:  Procedure Laterality Date   BIOPSY  09/29/2019   Procedure: BIOPSY;  Surgeon: Kerin Salen, MD;  Location: [facility];  Service: Gastroenterology;;   BREAST EXCISIONAL BIOPSY Right    BREAST EXCISIONAL BIOPSY Left 05/04/2018   BREAST LUMPECTOMY WITH RADIOACTIVE SEED LOCALIZATION Left 05/04/2018   Procedure: LEFT BREAST LUMPECTOMY WITH RADIOACTIVE SEED LOCALIZATION AND LEFT BREAST NIPPLE BIOPSY;  Surgeon: Griselda Miner, MD;  Location: [facility];  Service: General;  Laterality: Left;   BREAST SURGERY  05/25/1973   lumpectomy-- benign   BUNIONECTOMY  05/25/1989   CATARACT EXTRACTION W/  INTRAOCULAR LENS  IMPLANT, BILATERAL  05/25/1998   CERVICAL FUSION  07/24/2011   C5 -- C7   CESAREAN SECTION  05/26/1983   COLONOSCOPY WITH PROPOFOL N/A 09/29/2019   Procedure: COLONOSCOPY WITH PROPOFOL;  Surgeon: Kerin Salen, MD;  Location: [facility];  Service: Gastroenterology;  Laterality: N/A;   DISTAL INTERPHALANGEAL JOINT FUSION Right 03/14/2015   Procedure: RIGHT LONG FINGER DISTAL INTERPHALANGEAL JOINT ARTHRODESIS;  Surgeon: Bradly Bienenstock, MD;  Location: [facility];  Service: Orthopedics;  Laterality: Right;   ESOPHAGOGASTRODUODENOSCOPY (EGD) WITH PROPOFOL N/A 09/29/2019   Procedure: ESOPHAGOGASTRODUODENOSCOPY (EGD) WITH PROPOFOL;  Surgeon: Kerin Salen, MD;  Location: [facility];  Service: Gastroenterology;  Laterality: N/A;   EYE SURGERY  05/25/1993   rk (laser surgery), semi cornea transplant, detacted retina,  fluid removal   HYSTEROSCOPY WITH D & C N/A 12/09/2020   Procedure: DILATATION AND CURETTAGE /HYSTEROSCOPY;  Surgeon: Steva Ready, DO;  Location: [facility];  Service: Gynecology;  Laterality: N/A;   KNEE ARTHROSCOPY Left 03/03/2004   POLYPECTOMY  09/29/2019   Procedure: POLYPECTOMY;  Surgeon: Kerin Salen, MD;  [facility]: [facility];  Service: Gastroenterology;;   POSTERIOR VITRECTOMY RIGHT EYE AND LASER   10/27/1999   SHOULDER SURGERY Right 05/26/1995   SKIN BIOPSY  05/2022   lower stomach   SPINAL FIXATION SURGERY W/ IMPLANT  2013 rod #1//  2014  rod 2   S1 -- T10  (rod #1)//   S1 -- T4 (rod #2)   THORACIC FUSION  03/13/2013   REMOVAL HARDWARE/  BONE GRAFT FUSION T10//  REVISION OF RODS   TOTAL KNEE ARTHROPLASTY Left 12/14/2005   UVULOPALATOPHARYNGOPLASTY  04/26/2006   w/  TONSILLECTOMY/  TURBINATE REDUCTIONS/  BILATERAL ANTERIOR ETHMOIDECTOMY   Current Outpatient Medications on File Prior to Visit  Medication Sig Dispense Refill   albuterol (VENTOLIN HFA) 108 (90 Base) MCG/ACT inhaler Inhale 2 puffs into the lungs every 6  (six) hours as needed for wheezing or shortness of breath. 8 g 0   buPROPion (WELLBUTRIN XL) 300 MG 24 hr tablet Take 300 mg by mouth every morning.  1   clonazePAM (KLONOPIN) 1 MG tablet TAKE 1 TABLET BY MOUTH EVERY DAY AS NEEDED FOR ANXIETY (Patient taking differently: Take 1 mg by mouth daily as needed for anxiety.) 30 tablet 1   dexlansoprazole (DEXILANT) 60 MG capsule TAKE 1 CAPSULE BY MOUTH EVERY DAY 90 capsule 1   diazepam (VALIUM) 5 MG tablet Take 2 tablets (10 mg total) by mouth every 8 (eight) hours as needed for anxiety. 30 tablet 0   doxazosin (CARDURA) 4 MG tablet Take 1 tablet (4 mg total) by mouth daily. 30 tablet 1   doxepin (SINEQUAN) 50 MG capsule Take 50 mg by mouth.     ezetimibe (ZETIA) 10 MG tablet TAKE 1 TABLET BY MOUTH EVERY DAY 90 tablet 2   finasteride (PROSCAR) 5 MG tablet Take 0.5 tablets (2.5 mg total) by mouth daily. 90 tablet 1   FLUoxetine (PROZAC) 20 MG capsule Take 20 mg by mouth 3 (three) times daily.     fluticasone (FLONASE) 50 MCG/ACT nasal spray Place 2 sprays into both nostrils daily as needed for allergies. 16 g 11   hydrocortisone cream 1 % Apply 1 Application topically 2 (two) times daily. 30 g 0   INGREZZA 80 MG capsule Take one capsule by mouth daily 30 capsule 1   leflunomide (ARAVA) 20 MG tablet TAKE 1 TABLET BY MOUTH EVERY DAY 90 tablet 0   linaclotide (LINZESS) 72 MCG capsule Take 1 capsule (72 mcg total) by mouth daily before breakfast. (Patient taking differently: Take 72 mcg by mouth as needed.) 90 capsule 3   metoprolol succinate (TOPROL-XL) 50 MG 24 hr tablet TAKE 1 TABLET BY MOUTH ONCE DAILY FOLLOWING A MEAL 90 tablet 1   ondansetron (ZOFRAN) 4 MG tablet Take 4 mg by mouth 4 (four) times daily as needed for nausea.     oxyCODONE-acetaminophen (PERCOCET) 5-325 MG tablet Take 1 tablet by mouth every 8 (eight) hours as needed for severe pain. 60 tablet 0   pregabalin (LYRICA) 100 MG capsule TAKE 1 CAPSULE BY MOUTH TWICE A DAY 60 capsule 5    pregabalin (LYRICA) 75 MG capsule Take 1 capsule (75 mg total) by mouth 2 (two) times daily. 60 capsule 0   tiZANidine (ZANAFLEX) 4 MG capsule Take 1 capsule (4 mg total) by mouth 3 (three) times daily. (Patient taking differently: Take 4 mg by mouth as needed.) 30 capsule 1   valbenazine (INGREZZA) 80 MG capsule Take 1 capsule (80 mg total) by mouth daily. 90 capsule 3   valsartan (DIOVAN) 320 MG tablet TAKE 1 TABLET BY MOUTH EVERY DAY 90 tablet 3   No current facility-administered medications on file prior to visit.       Allergies  Allergen Reactions   Latex  Rash    And Mouth sores Other reaction(s): rash Other reaction(s): rash Other reaction(s): rash   Amlodipine Besy-Benazepril Hcl Other (See Comments)    COUGH   Amoxicillin-Pot Clavulanate Diarrhea and Nausea And Vomiting    Other reaction(s): sick on stomach Other reaction(s): sick on stomach   Bextra [Valdecoxib] Diarrhea and Nausea And Vomiting    REFLUX   Chlorhexidine     rash Other reaction(s): rash all over body Other reaction(s): rash all over body   Lipitor [Atorvastatin Calcium] Other (See Comments)    MYALGIAS   Sulfa Antibiotics Nausea And Vomiting    CRAMPING Other reaction(s): stomach hurts Other reaction(s): stomach hurts   Zocor [Simvastatin] Other (See Comments)    MYALGIA   Social History   Socioeconomic History   Marital status: Married    Spouse name: Daryl   Number of children: Not on file   Years of education: Not on file   Highest education level: Not on file  Occupational History    Comment: retired  Tobacco Use   Smoking status: Former    Packs/day: 0.50    Years: 10.00    Additional pack years: 0.00    Total pack years: 5.00    Types: Cigarettes    Quit date: 03/07/1995    Years since quitting: 27.6    Passive exposure: Never   Smokeless tobacco: Never  Vaping Use   Vaping Use: Never used  Substance and Sexual Activity   Alcohol use: Yes    Comment: rare   Drug use:  Never   Sexual activity: Not on file  Other Topics Concern   Not on file  Social History Narrative   Lives with husband   Social Determinants of Health   Financial Resource Strain: Low Risk  (08/13/2022)   Overall Financial Resource Strain (CARDIA)    Difficulty of Paying Living Expenses: Not hard at all  Food Insecurity: No Food Insecurity (08/13/2022)   Hunger Vital Sign    Worried About Running Out of Food in the Last Year: Never true    Ran Out of Food in the Last Year: Never true  Transportation Needs: No Transportation Needs (08/13/2022)   PRAPARE - Administrator, Civil Service (Medical): No    Lack of Transportation (Non-Medical): No  Physical Activity: Inactive (08/13/2022)   Exercise Vital Sign    Days of Exercise per Week: 0 days    Minutes of Exercise per Session: 0 min  Stress: No Stress Concern Present (08/13/2022)   [employer] of Occupational Health - Occupational Stress Questionnaire    Feeling of Stress : Not at all  Social Connections: Socially Integrated (08/13/2022)   Social Connection and Isolation Panel [NHANES]    Frequency of Communication with Friends and Family: More than three times a week    Frequency of Social Gatherings with Friends and Family: More than three times a week    Attends Religious Services: More than 4 times per year    Active Member of [employer] or Organizations: Yes    Attends Banker Meetings: More than 4 times per year    Marital Status: Married  Catering manager Violence: Not At Risk (08/13/2022)   Humiliation, Afraid, Rape, and Kick questionnaire    Fear of Current or Ex-Partner: No    Emotionally Abused: No    Physically Abused: No    Sexually Abused: No    Review of Systems  All other systems reviewed and are negative.  Objective:   Physical Exam Vitals reviewed.  Constitutional:      General: She is not in acute distress.    Appearance: Normal appearance. She is normal weight. She is not  ill-appearing or toxic-appearing.  HENT:     Right Ear: Tympanic membrane and ear canal normal.     Left Ear: Tympanic membrane and ear canal normal.     Mouth/Throat:     Pharynx: Oropharynx is clear. No oropharyngeal exudate or posterior oropharyngeal erythema.  Cardiovascular:     Rate and Rhythm: Normal rate and regular rhythm.     Heart sounds: Normal heart sounds. No murmur heard.    No friction rub. No gallop.  Pulmonary:     Effort: Pulmonary effort is normal. No respiratory distress.     Breath sounds: Normal breath sounds. No wheezing, rhonchi or rales.  Abdominal:     General: Abdomen is flat. Bowel sounds are normal. There is no distension.     Palpations: Abdomen is soft.     Tenderness: There is no abdominal tenderness.  Musculoskeletal:     Right lower leg: No edema.     Left lower leg: No edema.  Neurological:     Mental Status: She is alert.          Assessment & Plan:  Benign essential HTN Continue metoprolol, doxazosin, and valsartan.  Avoid hydrochlorothiazide due to hyponatremia.  Add amlodipine 10 mg a day and recheck blood pressure in 2 weeks.  Recommended the patient not increase the dexilant.  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

## 2022-11-10 ENCOUNTER — Encounter: Payer: Self-pay | Admitting: Physician Assistant

## 2022-11-10 ENCOUNTER — Ambulatory Visit: Payer: Medicare Other | Attending: Physician Assistant | Admitting: Physician Assistant

## 2022-11-10 VITALS — BP 160/96 | HR 91 | Resp 15 | Ht 63.0 in | Wt 196.6 lb

## 2022-11-10 DIAGNOSIS — M19042 Primary osteoarthritis, left hand: Secondary | ICD-10-CM

## 2022-11-10 DIAGNOSIS — M19041 Primary osteoarthritis, right hand: Secondary | ICD-10-CM | POA: Diagnosis not present

## 2022-11-10 DIAGNOSIS — Z8719 Personal history of other diseases of the digestive system: Secondary | ICD-10-CM

## 2022-11-10 DIAGNOSIS — F5101 Primary insomnia: Secondary | ICD-10-CM

## 2022-11-10 DIAGNOSIS — Z8639 Personal history of other endocrine, nutritional and metabolic disease: Secondary | ICD-10-CM

## 2022-11-10 DIAGNOSIS — M5030 Other cervical disc degeneration, unspecified cervical region: Secondary | ICD-10-CM

## 2022-11-10 DIAGNOSIS — L409 Psoriasis, unspecified: Secondary | ICD-10-CM

## 2022-11-10 DIAGNOSIS — Z79899 Other long term (current) drug therapy: Secondary | ICD-10-CM

## 2022-11-10 DIAGNOSIS — Z96652 Presence of left artificial knee joint: Secondary | ICD-10-CM

## 2022-11-10 DIAGNOSIS — Z8679 Personal history of other diseases of the circulatory system: Secondary | ICD-10-CM

## 2022-11-10 DIAGNOSIS — R5383 Other fatigue: Secondary | ICD-10-CM

## 2022-11-10 DIAGNOSIS — M1711 Unilateral primary osteoarthritis, right knee: Secondary | ICD-10-CM

## 2022-11-10 DIAGNOSIS — G2581 Restless legs syndrome: Secondary | ICD-10-CM

## 2022-11-10 DIAGNOSIS — L4050 Arthropathic psoriasis, unspecified: Secondary | ICD-10-CM | POA: Diagnosis not present

## 2022-11-10 DIAGNOSIS — M5134 Other intervertebral disc degeneration, thoracic region: Secondary | ICD-10-CM

## 2022-11-10 DIAGNOSIS — M47816 Spondylosis without myelopathy or radiculopathy, lumbar region: Secondary | ICD-10-CM

## 2022-11-10 DIAGNOSIS — Z8669 Personal history of other diseases of the nervous system and sense organs: Secondary | ICD-10-CM

## 2022-11-10 DIAGNOSIS — M8589 Other specified disorders of bone density and structure, multiple sites: Secondary | ICD-10-CM

## 2022-11-10 DIAGNOSIS — Z8659 Personal history of other mental and behavioral disorders: Secondary | ICD-10-CM

## 2022-11-10 DIAGNOSIS — M797 Fibromyalgia: Secondary | ICD-10-CM

## 2022-11-10 NOTE — Patient Instructions (Signed)
Standing Labs We placed an order today for your standing lab work.   Please have your standing labs drawn in August and every 3 months   Please have your labs drawn 2 weeks prior to your appointment so that the provider can discuss your lab results at your appointment, if possible.  Please note that you may see your imaging and lab results in MyChart before we have reviewed them. We will contact you once all results are reviewed. Please allow our office up to 72 hours to thoroughly review all of the results before contacting the office for clarification of your results.  WALK-IN LAB HOURS  Monday through Thursday from 8:00 am -12:30 pm and 1:00 pm-5:00 pm and Friday from 8:00 am-12:00 pm.  Patients with office visits requiring labs will be seen before walk-in labs.  You may encounter longer than normal wait times. Please allow additional time. Wait times may be shorter on  Monday and Thursday afternoons.  We do not book appointments for walk-in labs. We appreciate your patience and understanding with our staff.   Labs are drawn by [facility]. Please bring your co-pay at the time of your lab draw.  You may receive a bill from [facility] for your lab work.  Please note if you are on Hydroxychloroquine and and an order has been placed for a Hydroxychloroquine level,  you will need to have it drawn 4 hours or more after your last dose.  If you wish to have your labs drawn at another location, please call the office 24 hours in advance so we can fax the orders.  The office is located at [address], [address], [place], [place] [address]   If you have any questions regarding directions or hours of operation,  please call [phone].   As a reminder, please drink plenty of water prior to coming for your lab work. Thanks!  

## 2022-11-11 ENCOUNTER — Encounter: Payer: Self-pay | Admitting: Cardiovascular Disease

## 2022-11-11 ENCOUNTER — Ambulatory Visit: Payer: Medicare Other | Attending: Cardiovascular Disease | Admitting: Cardiovascular Disease

## 2022-11-11 VITALS — BP 132/82 | HR 86 | Ht 63.0 in | Wt 196.0 lb

## 2022-11-11 DIAGNOSIS — I10 Essential (primary) hypertension: Secondary | ICD-10-CM

## 2022-11-11 DIAGNOSIS — R0609 Other forms of dyspnea: Secondary | ICD-10-CM | POA: Diagnosis not present

## 2022-11-11 DIAGNOSIS — E782 Mixed hyperlipidemia: Secondary | ICD-10-CM | POA: Diagnosis not present

## 2022-11-11 MED ORDER — SPIRONOLACTONE 25 MG PO TABS
25.0000 mg | ORAL_TABLET | Freq: Every day | ORAL | 3 refills | Status: DC
Start: 2022-11-11 — End: 2022-12-16

## 2022-11-11 MED ORDER — AMLODIPINE BESYLATE 5 MG PO TABS
5.0000 mg | ORAL_TABLET | Freq: Every day | ORAL | 3 refills | Status: DC
Start: 2022-11-11 — End: 2023-10-13

## 2022-11-11 NOTE — Patient Instructions (Signed)
Medication Instructions:  DECREASE Amlodipine to 5mg  daily START Spironolactone 25mg  daily *If you need a refill on your cardiac medications before your next appointment, please call your pharmacy*   Lab Work: BMET in 2-3 weeks If you have labs (blood work) drawn today and your tests are completely normal, you will receive your results only by: MyChart Message (if you have MyChart) OR A paper copy in the mail If you have any lab test that is abnormal or we need to change your treatment, we will call you to review the results.   Testing/Procedures: Coronary Calcium Score CT Your physician has requested that you have cardiac CT. Cardiac computed tomography (CT) is a painless test that uses an x-ray machine to take clear, detailed pictures of your heart. For further information please visit [email or web address] Please follow instruction sheet as given.  ECHO Your physician has requested that you have an echocardiogram. Echocardiography is a painless test that uses sound waves to create images of your heart. It provides your doctor with information about the size and shape of your heart and how well your heart's chambers and valves are working. This procedure takes approximately one hour. There are no restrictions for this procedure. Please do NOT wear cologne, perfume, aftershave, or lotions (deodorant is allowed). Please arrive 15 minutes prior to your appointment time.  Follow-Up: At [facility], you and your health needs are our priority.  As part of our continuing mission to provide you with exceptional heart care, we have created designated Provider Care Teams.  These Care Teams include your primary Cardiologist (physician) and Advanced Practice Providers (APPs -  Physician Assistants and Nurse Practitioners) who all work together to provide you with the care you need, when you need it.  Your next appointment:   3 month(s)  Provider:   Kristeen Miss, MD

## 2022-11-11 NOTE — Progress Notes (Signed)
Cardiology Office Note:  .   Date:  11/11/2022  ID:  Janet Peterson, DOB 06-16-1950, MRN [id number] PCP: Donita Brooks, MD  [facility] Providers Cardiologist: Jamorris Ndiaye  Click to update primary MD,subspecialty MD or APP then REFRESH:1}   History of Present Illness: .    Seen with Janet Peterson ( husband)   Janet Peterson is a 72 y.o. female with a history of hypertension, dyspnea, fibromyalgia,  anemia ,  psoriasis ( with arthritis) .  We are asked to see her today by Dr. Tanya Nones for further evaluation of her dyspnea and elevated BNP   She presented to her primary care doctor with some shortness of breath.  D-dimer was noted to be elevated. CTA of the lungs revealed no evidence of pulmonary emboli.  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  BP has been running high. 170s Was started on amlodipine and her blood pressure is 132/82 today. She avoids salt but she notes that they go out to eat 5 times a week   She has been on a diuretic in the past   Is starting back on her exercise  Went to [place] for 17 days         ROS:   Studies Reviewed: .         Risk Assessment/Calculations:             Physical Exam:   VS:  BP 132/82   Pulse 86   Ht 5\' 3"  (1.6 m)   Wt 196 lb (88.9 kg)   SpO2 98%   BMI 34.72 kg/m    Wt Readings from Last 3 Encounters:  11/11/22 196 lb (88.9 kg)  11/10/22 196 lb 9.6 oz (89.2 kg)  11/09/22 197 lb 6.4 oz (89.5 kg)    GEN: Well nourished, well developed in no acute distress NECK: No JVD; No carotid bruits CARDIAC: RRR, no murmurs, rubs, gallops RESPIRATORY:  Clear to auscultation without rales, wheezing or rhonchi  ABDOMEN: Soft, non-tender, non-distended EXTREMITIES:  No edema; No deformity   ASSESSMENT AND PLAN: .     1.  Shortness of breath with exertion: She recently seen by her primary medical doctor for shortness of breath.  She had a mildly elevated B natruretic peptide and also an mildly elevated D-dimer  study.  She has already had her CTA of her lungs which revealed no evidence of pulmonary emboli.  I suspect that the BNP elevation was due to her hypertension and volume overload.  Will reduce her amlodipine back down to 5 mg a day.  Start her on spironolactone 25 mg a day. Will check a basic metabolic profile in 2 to 3 weeks.  Will get an echocardiogram for further evaluation of her cardiac and valvular function  2.  Hypertension: Blood pressure is fairly well-controlled today but she is having some slight edema and headaches on the high-dose amlodipine.  I have noticed that her potassium levels are chronically low.  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  Will reduce amlodipine back down to 5 mg a day.  Will start spironolactone 25 mg a day.  3.  Hyperlipidemia: She has a strong family history of coronary artery disease.  She is on ezetimibe.  She does not tolerate statins.  Will get a coronary calcium score.  If she has significant coronary artery calcifications she may be a candidate for PCSK9 inhibitor since she does not tolerate statins.  Dispo:   Signed, Kristeen Miss, MD

## 2022-11-16 ENCOUNTER — Ambulatory Visit (HOSPITAL_COMMUNITY): Payer: Medicare Other | Attending: Cardiology

## 2022-11-16 DIAGNOSIS — R0609 Other forms of dyspnea: Secondary | ICD-10-CM | POA: Diagnosis present

## 2022-11-16 DIAGNOSIS — I10 Essential (primary) hypertension: Secondary | ICD-10-CM | POA: Diagnosis present

## 2022-11-16 DIAGNOSIS — E782 Mixed hyperlipidemia: Secondary | ICD-10-CM | POA: Insufficient documentation

## 2022-11-16 LAB — ECHOCARDIOGRAM COMPLETE
Area-P 1/2: 3.6 cm2
S' Lateral: 3.2 cm

## 2022-11-24 ENCOUNTER — Other Ambulatory Visit: Payer: Self-pay | Admitting: Family Medicine

## 2022-11-24 DIAGNOSIS — L650 Telogen effluvium: Secondary | ICD-10-CM

## 2022-11-24 NOTE — Telephone Encounter (Signed)
Prescription Request  11/24/2022  LOV: 11/09/2022  What is the name of the medication or equipment?   metoprolol succinate (TOPROL-XL) 50 MG 24 hr tablet   dexlansoprazole (DEXILANT) 60 MG capsule [[id number]] (requesting 90 day supply)  Have you contacted your pharmacy to request a refill? Yes   Which pharmacy would you like this sent to?  [id number] [place], [place] - [address] [place] [address] Phone: [phone] Fax: [phone]    Patient notified that their request is being sent to the clinical staff for review and that they should receive a response within 2 business days.   Please advise pharmacist.

## 2022-11-25 MED ORDER — DEXLANSOPRAZOLE 60 MG PO CPDR: 60.0000 mg | DELAYED_RELEASE_CAPSULE | Freq: Every day | ORAL | 0 refills | Status: DC

## 2022-11-25 MED ORDER — METOPROLOL SUCCINATE ER 50 MG PO TB24: ORAL_TABLET | ORAL | 0 refills | Status: DC

## 2022-11-25 NOTE — Telephone Encounter (Signed)
Last OV 11/09/22, within protocol.  Requested Prescriptions  Pending Prescriptions Disp Refills   dexlansoprazole (DEXILANT) 60 MG capsule 90 capsule 0    Sig: Take 1 capsule (60 mg total) by mouth daily.     Gastroenterology: Proton Pump Inhibitors Failed - 11/24/2022  5:41 PM      Failed - Valid encounter within last 12 months    Recent Outpatient Visits           1 year ago Hyponatremia   [facility] Pickard, [name], MD   1 year ago Bronchitis   [facility] Tanya Nones, [name], MD   1 year ago Bronchitis   [facility] Tanya Nones, [name], MD   1 year ago Anemia, unspecified type   [facility] Tanya Nones, [name], MD   1 year ago Acute non-recurrent pansinusitis   [facility] Valentino Nose, NP       Future Appointments             In 2 months Nahser, Deloris Ping, MD [facility] at [facility]   In 4 months Rachel Bo [facility]ology             metoprolol succinate (TOPROL-XL) 50 MG 24 hr tablet 90 tablet 0    Sig: Take with or immediately following a meal.     Cardiovascular:  Beta Blockers Failed - 11/24/2022  5:41 PM      Failed - Valid encounter within last 6 months    Recent Outpatient Visits           1 year ago Hyponatremia   [facility] Donita Brooks, MD   1 year ago Bronchitis   [facility] Tanya Nones, [name], MD   1 year ago Bronchitis   [facility] Tanya Nones, [name], MD   1 year ago Anemia, unspecified type   [facility] Donita Brooks, MD   1 year ago Acute non-recurrent pansinusitis   [facility] Valentino Nose, NP       Future Appointments             In 2 months Nahser, Deloris Ping, MD [facility] at [facility]   In 4 months Rachel Bo [facility] Rheumatology            Passed -  Last BP in normal range    BP Readings from Last 1 Encounters:  11/11/22 132/82         Passed - Last Heart Rate in normal range    Pulse Readings from Last 1 Encounters:  11/11/22 86

## 2022-12-01 ENCOUNTER — Telehealth: Payer: Self-pay

## 2022-12-01 ENCOUNTER — Other Ambulatory Visit: Payer: Self-pay | Admitting: Family Medicine

## 2022-12-01 DIAGNOSIS — L650 Telogen effluvium: Secondary | ICD-10-CM

## 2022-12-01 MED ORDER — FINASTERIDE 5 MG PO TABS: 2.5000 mg | ORAL_TABLET | Freq: Every day | ORAL | 1 refills | Status: DC

## 2022-12-01 NOTE — Telephone Encounter (Signed)
Prescription Request  12/01/2022  LOV: 11/09/2022-- per pharmacy request for a new rx patient says this medication was increased to 1 tablet daily  What is the name of the medication or equipment? finasteride (PROSCAR) 5 MG tablet   Have you contacted your pharmacy to request a refill? Yes   Which pharmacy would you like this sent to?  [id number] [place], [place] - [address] [place] [address] Phone: [phone] Fax: [phone]    Patient notified that their request is being sent to the clinical staff for review and that they should receive a response within 2 business days.   Please advise at [facility] [phone]

## 2022-12-01 NOTE — Telephone Encounter (Signed)
Pt called in to request a refill of this med oxyCODONE-acetaminophen (PERCOCET) 5-325 MG tablet [[id number]]. Pt  also asks if pcp would send in a new prescription of this med finasteride (PROSCAR) 5 MG tablet [[id number]]. Pt stated that pharmacy will not refill due to the way the prescription is written as half tablet daily. Pt is asking that it be written for whole tablet daily please. Please advise.  Cb#: [phone]

## 2022-12-02 ENCOUNTER — Ambulatory Visit: Payer: Medicare Other | Attending: Cardiology

## 2022-12-02 ENCOUNTER — Ambulatory Visit: Payer: Medicare Other | Admitting: Student

## 2022-12-02 DIAGNOSIS — E782 Mixed hyperlipidemia: Secondary | ICD-10-CM

## 2022-12-02 DIAGNOSIS — R0609 Other forms of dyspnea: Secondary | ICD-10-CM

## 2022-12-02 DIAGNOSIS — I10 Essential (primary) hypertension: Secondary | ICD-10-CM

## 2022-12-03 LAB — BASIC METABOLIC PANEL
BUN/Creatinine Ratio: 16 (ref 12–28)
BUN: 14 mg/dL (ref 8–27)
CO2: 23 mmol/L (ref 20–29)
Calcium: 10.2 mg/dL (ref 8.7–10.3)
Chloride: 93 mmol/L — ABNORMAL LOW (ref 96–106)
Creatinine, Ser: 0.86 mg/dL (ref 0.57–1.00)
Glucose: 96 mg/dL (ref 70–99)
Potassium: 4.8 mmol/L (ref 3.5–5.2)
Sodium: 130 mmol/L — ABNORMAL LOW (ref 134–144)
eGFR: 72 mL/min/{1.73_m2} (ref >59)

## 2022-12-04 ENCOUNTER — Telehealth: Payer: Self-pay | Admitting: Family Medicine

## 2022-12-04 ENCOUNTER — Telehealth: Payer: Self-pay

## 2022-12-04 ENCOUNTER — Other Ambulatory Visit: Payer: Self-pay | Admitting: Family Medicine

## 2022-12-04 ENCOUNTER — Other Ambulatory Visit: Payer: Self-pay

## 2022-12-04 DIAGNOSIS — L650 Telogen effluvium: Secondary | ICD-10-CM

## 2022-12-04 MED ORDER — FINASTERIDE 5 MG PO TABS: 2.5000 mg | ORAL_TABLET | Freq: Every day | ORAL | 1 refills | Status: DC

## 2022-12-04 MED ORDER — FINASTERIDE 5 MG PO TABS
2.5000 mg | ORAL_TABLET | Freq: Every day | ORAL | 1 refills | Status: DC
Start: 2022-12-04 — End: 2022-12-04

## 2022-12-04 MED ORDER — OXYCODONE-ACETAMINOPHEN 5-325 MG PO TABS: 1.0000 | ORAL_TABLET | Freq: Three times a day (TID) | ORAL | 0 refills | Status: DC | PRN

## 2022-12-04 NOTE — Telephone Encounter (Signed)
Prescription Request  12/04/2022  LOV: 11/09/2022  What is the name of the medication or equipment?   finasteride (PROSCAR) 5 MG tablet  **Pharmacy sent request for clarification; patient says FINASTERIDE WAS INCREASED TO 1 TABLET DAILY**  Have you contacted your pharmacy to request a refill? Yes   Which pharmacy would you like this sent to?  [id number] [place], [place] - [address] [place] [address] Phone: [phone] Fax: [phone]    Patient notified that their request is being sent to the clinical staff for review and that they should receive a response within 2 business days.   Please advise pharmacist.

## 2022-12-04 NOTE — Telephone Encounter (Signed)
Pt has called and states pharmacy has not received her Finastride or Oxycodone prescriptions. Pt asks if they can be sent in for her? Thanks!

## 2022-12-08 ENCOUNTER — Ambulatory Visit (HOSPITAL_BASED_OUTPATIENT_CLINIC_OR_DEPARTMENT_OTHER)
Admission: RE | Admit: 2022-12-08 | Discharge: 2022-12-08 | Disposition: A | Discharge status: Home / Self Care | Payer: Medicare Other | Source: Ambulatory Visit | Attending: Cardiovascular Disease | Admitting: Cardiovascular Disease

## 2022-12-08 ENCOUNTER — Other Ambulatory Visit: Payer: Self-pay | Admitting: Family Medicine

## 2022-12-08 DIAGNOSIS — R0609 Other forms of dyspnea: Secondary | ICD-10-CM | POA: Insufficient documentation

## 2022-12-08 DIAGNOSIS — L650 Telogen effluvium: Secondary | ICD-10-CM

## 2022-12-08 DIAGNOSIS — I10 Essential (primary) hypertension: Secondary | ICD-10-CM | POA: Insufficient documentation

## 2022-12-08 DIAGNOSIS — E782 Mixed hyperlipidemia: Secondary | ICD-10-CM | POA: Insufficient documentation

## 2022-12-08 MED ORDER — FINASTERIDE 5 MG PO TABS
5.0000 mg | ORAL_TABLET | Freq: Every day | ORAL | 1 refills | Status: DC
Start: 2022-12-08 — End: 2023-08-04

## 2022-12-08 NOTE — Telephone Encounter (Signed)
Patient called to follow up on refill request for finasteride (PROSCAR) 5 MG tablet .  Pharmacy advised patient they need a new script for 1 tablet to replace the script on file for 1/2 tablet.   Pharmacy confirmed as:   [id number] [place], [place] - [address] [place] [address] Phone: [phone]  Fax: [phone] DEA #: [id number]   Please advise at [phone].

## 2022-12-10 ENCOUNTER — Telehealth: Payer: Self-pay

## 2022-12-10 DIAGNOSIS — E782 Mixed hyperlipidemia: Secondary | ICD-10-CM

## 2022-12-10 DIAGNOSIS — R931 Abnormal findings on diagnostic imaging of heart and coronary circulation: Secondary | ICD-10-CM

## 2022-12-10 DIAGNOSIS — Z789 Other specified health status: Secondary | ICD-10-CM

## 2022-12-10 NOTE — Telephone Encounter (Signed)
-----   Message from Janet Peterson sent at 12/08/2022  6:00 PM EDT ----- CAC is 850.  This is places her in the 95th percentile for age and sex matched controls.  She is intolerant to statins.  Please refer her to the lipid clinic for consideration of a PCSK9 inhibitor or inclisiran.

## 2022-12-10 NOTE — Telephone Encounter (Signed)
Patient has viewed interpretation via MyChart with pharmD recommendation. Referral placed to lipid clinic at this time.

## 2022-12-15 NOTE — Progress Notes (Unsigned)
Patient ID: Janet Peterson                 DOB: 01/21/1951                    MRN: [id number]     HPI: Janet Peterson is a 72 y.o. female patient referred to lipid clinic by Dr Elease Hashimoto. PMH is significant for HTN, fibromyalgia, anemia, psoriasis, and arthritis. First seen by Dr Elease Hashimoto 11/11/22 for further evaluation of dyspnea and elevated BNP. Given strong family history of CAD, she underwent calcium scoring on 12/08/22 which was elevated at 850 (95th percentile for age, race and sex matched controls). She has a history of statin intolerance and was referred to PharmD to discuss PSCK9i or Leqvio. Given slight edema, her amlodipine was reduced to 5mg  and she was started on spironolactone 25mg  as well. Her sodium dropped from 139 to 130 but no med changes were made.  Pt presents today with her husband. Tolerating ezetimibe well. Experienced joint pain on 3 prior statins - atorvastatin, simvastatin, and pravastatin. Joint pain took a few months to appear, and resolved with med discontinuation. Does have some arthritis at baseline.   Reports prior hyponatremia on hydrochlorothiazide. Discussed Na is also low on spironolactone. Pt has been feeling fatigued and dizzy and has had falls - most recently 2 days ago when feeling dizzy. BP has been better 120s-130, however also reports about a half dozen readings of SBP in the 100 range. Unable to hear BP today in office, tried checking manually on each arm and with automatic cuff on each arm which read error. Takes her doxazosin in the AM.  Current Medications: ezetimibe 10mg  daily  Intolerances: atorvastatin, simvastatin, pravastatin 20mg  daily and once weekly - joint pain Risk Factors: elevated calcium score, FHx CAD LDL goal: 70mg /dL  Family History:  Family History  Problem Relation Age of Onset   Diabetes Mother    Heart disease Mother    Diabetes Father    Anuerysm Father    Diabetes Brother    Heart disease Brother    Diabetes Brother    Heart disease  Brother    Breast cancer Neg Hx     Social History: Former tobacco use 1/2 PPD x 10 years, quit in 1996. Rare alcohol use, no drug use.  Labs: 06/04/21: TC 191, HDL 58, TG 133, LDL 108 - ezetimibe 10mg  daily  Past Medical History:  Diagnosis Date   Ankylosing spondylitis (HCC)    Anxiety and depression    Depression    Fibromyalgia    GERD (gastroesophageal reflux disease)    Hiatal hernia    per patient, dx by GI    History of basal cell carcinoma excision    FACE, 1992  &  1996   History of hiatal hernia    History of thrush    Hyperlipidemia    Hypertension    Insomnia    Left breast mass    OCD (obsessive compulsive disorder)    Osteopenia    PONV (postoperative nausea and vomiting)    Psoriatic arthritis (HCC)    PVC (premature ventricular contraction)    Rheumatoid arthritis (HCC)     Current Outpatient Medications on File Prior to Visit  Medication Sig Dispense Refill   albuterol (VENTOLIN HFA) 108 (90 Base) MCG/ACT inhaler TAKE 2 PUFFS BY MOUTH EVERY 6 HOURS AS NEEDED FOR WHEEZE OR SHORTNESS OF BREATH 8.5 each 1   amLODipine (NORVASC) 5 MG tablet  Take 1 tablet (5 mg total) by mouth daily. 90 tablet 3   buPROPion (WELLBUTRIN XL) 300 MG 24 hr tablet Take 300 mg by mouth every morning.  1   clonazePAM (KLONOPIN) 1 MG tablet TAKE 1 TABLET BY MOUTH EVERY DAY AS NEEDED FOR ANXIETY (Patient taking differently: Take 1 mg by mouth daily as needed for anxiety.) 30 tablet 1   dexlansoprazole (DEXILANT) 60 MG capsule Take 1 capsule (60 mg total) by mouth daily. 90 capsule 0   doxazosin (CARDURA) 4 MG tablet TAKE 1 TABLET BY MOUTH EVERY DAY 90 tablet 1   doxepin (SINEQUAN) 50 MG capsule Take 50 mg by mouth.     ezetimibe (ZETIA) 10 MG tablet TAKE 1 TABLET BY MOUTH EVERY DAY 90 tablet 2   finasteride (PROSCAR) 5 MG tablet Take 1 tablet (5 mg total) by mouth daily. 90 tablet 1   FLUoxetine (PROZAC) 20 MG capsule Take 20 mg by mouth 3 (three) times daily.     fluticasone  (FLONASE) 50 MCG/ACT nasal spray Place 2 sprays into both nostrils daily as needed for allergies. 16 g 11   hydrocortisone cream 1 % Apply 1 Application topically 2 (two) times daily. 30 g 0   INGREZZA 80 MG capsule Take one capsule by mouth daily 30 capsule 1   leflunomide (ARAVA) 20 MG tablet TAKE 1 TABLET BY MOUTH EVERY DAY 90 tablet 0   linaclotide (LINZESS) 72 MCG capsule Take 1 capsule (72 mcg total) by mouth daily before breakfast. (Patient taking differently: Take 72 mcg by mouth as needed.) 90 capsule 3   metoprolol succinate (TOPROL-XL) 50 MG 24 hr tablet Take with or immediately following a meal. 90 tablet 0   ondansetron (ZOFRAN) 4 MG tablet Take 4 mg by mouth 4 (four) times daily as needed for nausea.     oxyCODONE-acetaminophen (PERCOCET) 5-325 MG tablet Take 1 tablet by mouth every 8 (eight) hours as needed for severe pain. 60 tablet 0   spironolactone (ALDACTONE) 25 MG tablet Take 1 tablet (25 mg total) by mouth daily. 90 tablet 3   tiZANidine (ZANAFLEX) 4 MG capsule Take 1 capsule (4 mg total) by mouth 3 (three) times daily. (Patient taking differently: Take 4 mg by mouth as needed.) 30 capsule 1   valbenazine (INGREZZA) 80 MG capsule Take 1 capsule (80 mg total) by mouth daily. 90 capsule 3   valsartan (DIOVAN) 320 MG tablet TAKE 1 TABLET BY MOUTH EVERY DAY 90 tablet 3   No current facility-administered medications on file prior to visit.    Allergies  Allergen Reactions   Latex Rash    And Mouth sores Other reaction(s): rash Other reaction(s): rash Other reaction(s): rash   Amlodipine Besy-Benazepril Hcl Other (See Comments)    COUGH   Amoxicillin-Pot Clavulanate Diarrhea and Nausea And Vomiting    Other reaction(s): sick on stomach Other reaction(s): sick on stomach   Bextra [Valdecoxib] Diarrhea and Nausea And Vomiting    REFLUX   Chlorhexidine     rash Other reaction(s): rash all over body Other reaction(s): rash all over body   Lipitor [Atorvastatin Calcium]  Other (See Comments)    MYALGIAS   Sulfa Antibiotics Nausea And Vomiting    CRAMPING Other reaction(s): stomach hurts Other reaction(s): stomach hurts   Zocor [Simvastatin] Other (See Comments)    MYALGIA    Assessment/Plan:  1. Hyperlipidemia - LDL was 108 in Jan 2023 on ezetimibe 10mg  daily. Goal < 70 due to elevated calcium score. Checking updated  baseline labs today. Intolerant to 3 statins (joint pain). Discussed rechallenging with rosuvastatin 5mg  daily vs trying Praluent. She prefers to try Praluent. Cannot use Repatha due to latex allergy. Will submit prior auth once updated baseline labs result.  2. Hypertension - Unable to auscultate today, tried checking manually and with automatic cuff in each arm. Ok with higher BP goal < 140/90 especially as pt has reported recent dizziness, fatigue, and falls. Will stop her spironolactone secondary to this as well as hyponatremia on med (Na dropped from 139 to 130, she reports this also happened on hydrochlorothiazide in the past). Her doxazosin can also cause dizziness and she has been taking this in the AM. Advised her to move this to before bedtime. She'll continue her other BP meds including amlodipine 5mg  daily (LE edema on 10mg  dose), valsartan 320mg  daily, doxazosin 4mg  daily, and Toprol 50mg  daily. If she needs additional BP lowering, would replace her Toprol with carvedilol. Will call her in 2 weeks for update with BP readings and will plan to recheck BMET to reassess sodium when her updated labs on PCSK9i are checked.  Shaun Runyon E. Aubria Vanecek, PharmD, BCACP, CPP [facility] [address], [place], [place] [address] Phone: [phone]; Fax: [phone] 12/16/2022 2:57 PM

## 2022-12-16 ENCOUNTER — Ambulatory Visit: Payer: Medicare Other | Attending: Internal Medicine | Admitting: Pharmacist

## 2022-12-16 VITALS — HR 79

## 2022-12-16 DIAGNOSIS — I10 Essential (primary) hypertension: Secondary | ICD-10-CM

## 2022-12-16 DIAGNOSIS — E7800 Pure hypercholesterolemia, unspecified: Secondary | ICD-10-CM

## 2022-12-16 NOTE — Patient Instructions (Signed)
Cholesterol: -Your LDL is 108 and your goal is < 70 -Continue taking ezetimibe (Zetia) -I will submit information to your insurance for Praluent and let you know when I hear back.   -Praluent is a subcutaneous injection given once every 2 weeks in the fatty tissue of your stomach or upper outer thigh. Store the medication in the fridge. You can let your dose warm up to room temperature for 30 minutes before injecting if you prefer. Praluent will lower your LDL cholesterol by 60% and helps to lower your chance of having a heart attack or stroke.  Blood pressure: -Your goal is < 140/90 -Stop your spironolactone -Move your doxazosin to bedtime (this can cause dizziness so ideally you would sleep through it) -Elevate your legs and wear compression stockings as able

## 2022-12-17 ENCOUNTER — Telehealth: Payer: Self-pay

## 2022-12-17 ENCOUNTER — Other Ambulatory Visit (HOSPITAL_COMMUNITY): Payer: Self-pay

## 2022-12-17 LAB — LIPID PANEL
Cholesterol, Total: 223 mg/dL — ABNORMAL HIGH (ref 100–199)
HDL: 57 mg/dL (ref >39)
Triglycerides: 137 mg/dL (ref 0–149)
VLDL Cholesterol Cal: 24 mg/dL (ref 5–40)

## 2022-12-17 NOTE — Telephone Encounter (Signed)
-----   Message from Janet Peterson sent at 12/17/2022 10:55 AM EDT ----- CAC score is 850 ( 95th percentile for age / sex matched controls  She is on zetia - intolerant to statins  Please refer her to the lipid clinic for consideration of Inslisiran or PCKS9 inhibitor

## 2022-12-17 NOTE — Telephone Encounter (Signed)
Pharmacy Patient Advocate Encounter   Received notification from Physician's Office /PHARMD SUPPLE that prior authorization for PRALUENT 75MG /ML is required/requested.   Insurance verification completed.   The patient is insured through [facility] .   Per test claim: PA required; PA submitted to [facility] via CoverMyMeds Key/confirmation #/EOC [id number]       Status is pending

## 2022-12-17 NOTE — Telephone Encounter (Signed)
Pt saw the lipid clinic yesterday. Awaiting prior authorization via the insurance company

## 2022-12-18 MED ORDER — PRALUENT 75 MG/ML ~~LOC~~ SOAJ: 75.0000 mg | SUBCUTANEOUS | 11 refills | Status: DC

## 2022-12-18 NOTE — Telephone Encounter (Signed)
Pt's husband notified of approval (pt still sleeping) as well as updated baseline labs (LDL 142). Rx sent to pharmacy. Advised to call if unaffordable and could try rosuvastatin 5mg  at that time.

## 2022-12-18 NOTE — Telephone Encounter (Signed)
Pharmacy Patient Advocate Encounter  Received notification from [facility] that Prior Authorization for PRALUENT 75MG /ML has been  APPROVED THROUGH 1.25.25

## 2022-12-18 NOTE — Addendum Note (Signed)
Addended by: Indiana Gamero E on: 12/18/2022 09:58 AM   Modules accepted: Orders

## 2022-12-21 ENCOUNTER — Ambulatory Visit (INDEPENDENT_AMBULATORY_CARE_PROVIDER_SITE_OTHER): Payer: Medicare Other | Admitting: Family Medicine

## 2022-12-21 VITALS — BP 120/72 | HR 85 | Temp 98.0°F | Ht 63.0 in | Wt 201.0 lb

## 2022-12-21 DIAGNOSIS — Z6835 Body mass index (BMI) 35.0-35.9, adult: Secondary | ICD-10-CM

## 2022-12-21 DIAGNOSIS — I10 Essential (primary) hypertension: Secondary | ICD-10-CM | POA: Diagnosis not present

## 2022-12-21 MED ORDER — CYCLOBENZAPRINE HCL 10 MG PO TABS: 10.0000 mg | ORAL_TABLET | Freq: Three times a day (TID) | ORAL | 0 refills | Status: DC | PRN

## 2022-12-21 NOTE — Progress Notes (Signed)
Subjective:    Patient ID: Janet Peterson, female    DOB: [birth year], 72 y.o.   MRN: [id number]  Patient has 3 concerns today.  First she would like to try Flexeril as a muscle relaxer.  She is currently using tizanidine but she does not feel that the tizanidine is working well.  Second she would like assistance with weight loss.  She has tried numerous diets in the past including weight watchers and Doylene Bode and has been unsuccessful.  She is interested in trying one of the GLP-1 medications.  She is not a diabetic.  She has no history of coronary artery disease.  Therefore I do not feel that her insurance will cover 1 of these medications.  Lastly she is concerned several wounds all over her body.  2 months ago, she saw her dermatologist and he performed cryotherapy on approximately 10 different keratosis.  These areas have now become hyperpigmented reddish-brown papules with scarring.  They do not appear infected.  They do not appear malignant.  They appear to have postinflammatory hyperpigmentation likely due to the cryotherapy.  They are not tender or painful Past Medical History:  Diagnosis Date   Ankylosing spondylitis (HCC)    Anxiety and depression    Depression    Fibromyalgia    GERD (gastroesophageal reflux disease)    Hiatal hernia    per patient, dx by GI    History of basal cell carcinoma excision    FACE, 1992  &  1996   History of hiatal hernia    History of thrush    Hyperlipidemia    Hypertension    Insomnia    Left breast mass    OCD (obsessive compulsive disorder)    Osteopenia    PONV (postoperative nausea and vomiting)    Psoriatic arthritis (HCC)    PVC (premature ventricular contraction)    Rheumatoid arthritis (HCC)    Past Surgical History:  Procedure Laterality Date   BIOPSY  09/29/2019   Procedure: BIOPSY;  Surgeon: Kerin Salen, MD;  Location: [facility];  Service: Gastroenterology;;   BREAST EXCISIONAL BIOPSY Right    BREAST EXCISIONAL BIOPSY Left  05/04/2018   BREAST LUMPECTOMY WITH RADIOACTIVE SEED LOCALIZATION Left 05/04/2018   Procedure: LEFT BREAST LUMPECTOMY WITH RADIOACTIVE SEED LOCALIZATION AND LEFT BREAST NIPPLE BIOPSY;  Surgeon: Griselda Miner, MD;  Location: [facility];  Service: General;  Laterality: Left;   BREAST SURGERY  05/25/1973   lumpectomy-- benign   BUNIONECTOMY  05/25/1989   CATARACT EXTRACTION W/ INTRAOCULAR LENS  IMPLANT, BILATERAL  05/25/1998   CERVICAL FUSION  07/24/2011   C5 -- C7   CESAREAN SECTION  05/26/1983   COLONOSCOPY WITH PROPOFOL N/A 09/29/2019   Procedure: COLONOSCOPY WITH PROPOFOL;  Surgeon: Kerin Salen, MD;  Location: [facility];  Service: Gastroenterology;  Laterality: N/A;   DISTAL INTERPHALANGEAL JOINT FUSION Right 03/14/2015   Procedure: RIGHT LONG FINGER DISTAL INTERPHALANGEAL JOINT ARTHRODESIS;  Surgeon: Bradly Bienenstock, MD;  Location: [facility];  Service: Orthopedics;  Laterality: Right;   ESOPHAGOGASTRODUODENOSCOPY (EGD) WITH PROPOFOL N/A 09/29/2019   Procedure: ESOPHAGOGASTRODUODENOSCOPY (EGD) WITH PROPOFOL;  Surgeon: Kerin Salen, MD;  Location: [facility];  Service: Gastroenterology;  Laterality: N/A;   EYE SURGERY  05/25/1993   rk (laser surgery), semi cornea transplant, detacted retina,  fluid removal   HYSTEROSCOPY WITH D & C N/A 12/09/2020   Procedure: DILATATION AND CURETTAGE /HYSTEROSCOPY;  Surgeon: Steva Ready, DO;  Location: [facility];  Service: Gynecology;  Laterality: N/A;   KNEE ARTHROSCOPY Left 03/03/2004   POLYPECTOMY  09/29/2019   Procedure: POLYPECTOMY;  Surgeon: Kerin Salen, MD;  Location: [facility];  Service: Gastroenterology;;   POSTERIOR VITRECTOMY RIGHT EYE AND LASER   10/27/1999   SHOULDER SURGERY Right 05/26/1995   SKIN BIOPSY  05/2022   lower stomach   SPINAL FIXATION SURGERY W/ IMPLANT  2013 rod #1//  2014  rod 2   S1 -- T10  (rod #1)//   S1 -- T4 (rod #2)   THORACIC FUSION  03/13/2013   REMOVAL  HARDWARE/  BONE GRAFT FUSION T10//  REVISION OF RODS   TOTAL KNEE ARTHROPLASTY Left 12/14/2005   UVULOPALATOPHARYNGOPLASTY  04/26/2006   w/  TONSILLECTOMY/  TURBINATE REDUCTIONS/  BILATERAL ANTERIOR ETHMOIDECTOMY   Current Outpatient Medications on File Prior to Visit  Medication Sig Dispense Refill   albuterol (VENTOLIN HFA) 108 (90 Base) MCG/ACT inhaler TAKE 2 PUFFS BY MOUTH EVERY 6 HOURS AS NEEDED FOR WHEEZE OR SHORTNESS OF BREATH 8.5 each 1   Alirocumab (PRALUENT) 75 MG/ML SOAJ Inject 1 mL (75 mg total) into the skin every 14 (fourteen) days. 2 mL 11   amLODipine (NORVASC) 5 MG tablet Take 1 tablet (5 mg total) by mouth daily. 90 tablet 3   buPROPion (WELLBUTRIN XL) 300 MG 24 hr tablet Take 300 mg by mouth every morning.  1   clonazePAM (KLONOPIN) 1 MG tablet TAKE 1 TABLET BY MOUTH EVERY DAY AS NEEDED FOR ANXIETY (Patient taking differently: Take 1 mg by mouth daily as needed for anxiety.) 30 tablet 1   dexlansoprazole (DEXILANT) 60 MG capsule Take 1 capsule (60 mg total) by mouth daily. 90 capsule 0   doxazosin (CARDURA) 4 MG tablet TAKE 1 TABLET BY MOUTH EVERY DAY 90 tablet 1   doxepin (SINEQUAN) 50 MG capsule Take 50 mg by mouth.     ezetimibe (ZETIA) 10 MG tablet TAKE 1 TABLET BY MOUTH EVERY DAY 90 tablet 2   finasteride (PROSCAR) 5 MG tablet Take 1 tablet (5 mg total) by mouth daily. 90 tablet 1   FLUoxetine (PROZAC) 20 MG capsule Take 20 mg by mouth 3 (three) times daily.     fluticasone (FLONASE) 50 MCG/ACT nasal spray Place 2 sprays into both nostrils daily as needed for allergies. 16 g 11   hydrocortisone cream 1 % Apply 1 Application topically 2 (two) times daily. 30 g 0   INGREZZA 80 MG capsule Take one capsule by mouth daily 30 capsule 1   leflunomide (ARAVA) 20 MG tablet TAKE 1 TABLET BY MOUTH EVERY DAY 90 tablet 0   linaclotide (LINZESS) 72 MCG capsule Take 1 capsule (72 mcg total) by mouth daily before breakfast. (Patient taking differently: Take 72 mcg by mouth as needed.)  90 capsule 3   metoprolol succinate (TOPROL-XL) 50 MG 24 hr tablet Take with or immediately following a meal. 90 tablet 0   ondansetron (ZOFRAN) 4 MG tablet Take 4 mg by mouth 4 (four) times daily as needed for nausea.     oxyCODONE-acetaminophen (PERCOCET) 5-325 MG tablet Take 1 tablet by mouth every 8 (eight) hours as needed for severe pain. 60 tablet 0   valbenazine (INGREZZA) 80 MG capsule Take 1 capsule (80 mg total) by mouth daily. 90 capsule 3   valsartan (DIOVAN) 320 MG tablet TAKE 1 TABLET BY MOUTH EVERY DAY 90 tablet 3   No current facility-administered medications on file prior to visit.       Allergies  Allergen Reactions   Latex Rash    And Mouth sores Other reaction(s): rash Other reaction(s): rash Other reaction(s): rash   Amlodipine Besy-Benazepril Hcl Other (See Comments)    COUGH   Amoxicillin-Pot Clavulanate Diarrhea and Nausea And Vomiting    Other reaction(s): sick on stomach Other reaction(s): sick on stomach   Bextra [Valdecoxib] Diarrhea and Nausea And Vomiting    REFLUX   Chlorhexidine     rash Other reaction(s): rash all over body Other reaction(s): rash all over body   Hydrochlorothiazide     Low sodium   Lipitor [Atorvastatin Calcium] Other (See Comments)    MYALGIAS   Spironolactone     hyponatremia   Sulfa Antibiotics Nausea And Vomiting    CRAMPING Other reaction(s): stomach hurts Other reaction(s): stomach hurts   Zocor [Simvastatin] Other (See Comments)    MYALGIA   Social History   Socioeconomic History   Marital status: Married    Spouse name: Daryl   Number of children: Not on file   Years of education: Not on file   Highest education level: Not on file  Occupational History    Comment: retired  Tobacco Use   Smoking status: Former    Current packs/day: 0.00    Average packs/day: 0.5 packs/day for 10.0 years (5.0 ttl pk-yrs)    Types: Cigarettes    Start date: 03/06/1985    Quit date: 03/07/1995    Years since quitting:  27.8    Passive exposure: Never   Smokeless tobacco: Never  Vaping Use   Vaping status: Never Used  Substance and Sexual Activity   Alcohol use: Yes    Comment: rare   Drug use: Never   Sexual activity: Not on file  Other Topics Concern   Not on file  Social History Narrative   Lives with husband   Social Determinants of Health   Financial Resource Strain: Low Risk  (08/13/2022)   Overall Financial Resource Strain (CARDIA)    Difficulty of Paying Living Expenses: Not hard at all  Food Insecurity: No Food Insecurity (08/13/2022)   Hunger Vital Sign    Worried About Running Out of Food in the Last Year: Never true    Ran Out of Food in the Last Year: Never true  Transportation Needs: No Transportation Needs (08/13/2022)   PRAPARE - Administrator, Civil Service (Medical): No    Lack of Transportation (Non-Medical): No  Physical Activity: Inactive (08/13/2022)   Exercise Vital Sign    Days of Exercise per Week: 0 days    Minutes of Exercise per Session: 0 min  Stress: No Stress Concern Present (08/13/2022)   [employer] of Occupational Health - Occupational Stress Questionnaire    Feeling of Stress : Not at all  Social Connections: Socially Integrated (08/13/2022)   Social Connection and Isolation Panel [NHANES]    Frequency of Communication with Friends and Family: More than three times a week    Frequency of Social Gatherings with Friends and Family: More than three times a week    Attends Religious Services: More than 4 times per year    Active Member of [employer] or Organizations: Yes    Attends Banker Meetings: More than 4 times per year    Marital Status: Married  Catering manager Violence: Not At Risk (08/13/2022)   Humiliation, Afraid, Rape, and Kick questionnaire    Fear of Current or Ex-Partner: No    Emotionally Abused: No    Physically Abused:  No    Sexually Abused: No    Review of Systems  All other systems reviewed and are  negative.      Objective:   Physical Exam Vitals reviewed.  Constitutional:      General: She is not in acute distress.    Appearance: Normal appearance. She is normal weight. She is not ill-appearing or toxic-appearing.  HENT:     Right Ear: Tympanic membrane and ear canal normal.     Left Ear: Tympanic membrane and ear canal normal.     Mouth/Throat:     Pharynx: Oropharynx is clear. No oropharyngeal exudate or posterior oropharyngeal erythema.  Cardiovascular:     Rate and Rhythm: Normal rate and regular rhythm.     Heart sounds: Normal heart sounds. No murmur heard.    No friction rub. No gallop.  Pulmonary:     Effort: Pulmonary effort is normal. No respiratory distress.     Breath sounds: Normal breath sounds. No wheezing, rhonchi or rales.  Abdominal:     General: Abdomen is flat. Bowel sounds are normal. There is no distension.     Palpations: Abdomen is soft.     Tenderness: There is no abdominal tenderness.  Musculoskeletal:     Right lower leg: No edema.     Left lower leg: No edema.  Neurological:     Mental Status: She is alert.          Assessment & Plan:  BMI 35.0-35.9,adult  Benign essential HTN Patient does have hypertension and a BMI of 35.  She would be an excellent candidate for Ozempic but I explained to her that her insurance would not pay for the medication unless she is a diabetic.  I did give her the contact information from some local compounding pharmacies so that she can check on the price for paying cash.  If she is interested in trying the medications I would be willing to prescribe them.  Patient has at least 10 postinflammatory hypopigmented papules on her legs and lower abdomen.  These represent areas where her dermatologist performed cryotherapy.  They are not acutely infected.  There is no breakdown in the skin surface.  They are hypopigmented and scaly but they are not painful.  Recommended trying mederma daily to facilitate healing.

## 2022-12-22 ENCOUNTER — Other Ambulatory Visit: Payer: Self-pay | Admitting: Physician Assistant

## 2022-12-22 NOTE — Telephone Encounter (Signed)
Last Fill: 09/28/2022  Labs: 09/29/2022 RBC 3.60, Hgb 11.0, Hct 33.7, Total Protein 5.8, ALT 4  Next Visit: 04/12/2023  Last Visit: 11/10/2022  DX: Psoriatic arthritis   Current Dose per office note 11/10/2022: Arava 20 mg 1 tablet by mouth daily.   Okay to refill Arava ?

## 2022-12-25 ENCOUNTER — Telehealth: Payer: Self-pay | Admitting: Family Medicine

## 2022-12-25 NOTE — Telephone Encounter (Signed)
Patient called to follow up on status of new script for [facility]; prescribed for weight loss.   Pharmacy confirmed as   [facility] [address] [place], [place] [address] Ph: [phone] Fx: [phone]  Please advise at [phone].

## 2022-12-28 ENCOUNTER — Telehealth: Payer: Self-pay | Admitting: Family Medicine

## 2022-12-28 ENCOUNTER — Telehealth: Payer: Self-pay | Admitting: Pharmacist

## 2022-12-28 DIAGNOSIS — E7800 Pure hypercholesterolemia, unspecified: Secondary | ICD-10-CM

## 2022-12-28 DIAGNOSIS — I10 Essential (primary) hypertension: Secondary | ICD-10-CM

## 2022-12-28 NOTE — Telephone Encounter (Signed)
Called pt to follow up with lipids and BP, left message.  Need to confirm she picked up Praluent and started on injections. If cost was too high, would try rosuvastatin 5mg  daily instead and needs lipids and CMET scheduled in another 4-6 weeks.  Need to see how BP has been since stopping spironolactone secondary to dizziness, falls, and hyponatremia. Also advised her to move doxazosin from morning to bedtime to see if it improved daytime dizziness as well. If needed, can change her Toprol to carvedilol.

## 2022-12-28 NOTE — Telephone Encounter (Signed)
Spoke with pt. She picked up Praluent and didn't have issues with first dose. 2nd dose is due this Friday.  Has had some mild flu-like sx over the past 3 days.  Dizziness maybe a bit improved, occurring 1-2x a day. BP normal mostly, low a few times around 112/60, sits down when that happens. Saw her PCP on 7/29 and BP was well controlled there. Did move her doxazosin to bedtime. Still taking spironolactone sometimes though due to swelling in her legs. Took it twice in the last week. She isn't sure if days where she was more dizzy/BP lower were the days where she took spironolactone, she will keep an eye on this.   Scheduled lipid and CMET on 8/28. Will call pt after for updates and to review results.

## 2022-12-28 NOTE — Telephone Encounter (Signed)
Prescription Request  12/28/2022  LOV: 12/21/2022  What is the name of the medication or equipment?   oxyCODONE-acetaminophen (PERCOCET) 5-325 MG tablet   **SEMAGLUTIDE (will pay out of pocket)  - discussed at last visit for weight loss**  Have you contacted your pharmacy to request a refill? Yes   Which pharmacy would you like this sent to?  [id number] [place], [place] - [address] [facility] [address] [place] [address] Phone: [phone] Fax: [phone]    Patient notified that their request is being sent to the clinical staff for review and that they should receive a response within 2 business days.   Please advise patient

## 2022-12-29 ENCOUNTER — Other Ambulatory Visit: Payer: Self-pay | Admitting: Family Medicine

## 2022-12-29 MED ORDER — OXYCODONE-ACETAMINOPHEN 5-325 MG PO TABS: 1.0000 | ORAL_TABLET | Freq: Three times a day (TID) | ORAL | 0 refills | Status: DC | PRN

## 2022-12-31 ENCOUNTER — Encounter: Payer: Self-pay | Admitting: Family Medicine

## 2022-12-31 ENCOUNTER — Ambulatory Visit (INDEPENDENT_AMBULATORY_CARE_PROVIDER_SITE_OTHER): Payer: Medicare Other | Admitting: Family Medicine

## 2022-12-31 VITALS — BP 124/76 | HR 81 | Temp 98.0°F | Ht 63.0 in | Wt 203.4 lb

## 2022-12-31 DIAGNOSIS — E871 Hypo-osmolality and hyponatremia: Secondary | ICD-10-CM | POA: Diagnosis not present

## 2022-12-31 NOTE — Progress Notes (Signed)
Subjective:    Patient ID: Janet Peterson, female    DOB: 06-Jul-1950, 72 y.o.   MRN: [id number] In June, the patient's BNP was elevated.  Echocardiogram showed a normal ejection fraction but they could not assess diastolic dysfunction.  Cardiology began her on spironolactone due to the elevated BNP and her shortness of breath.  I suspect they were treating the patient for congestive heart failure with preserved ejection fraction.  However patient recently had a sodium level of 130 and the pharmacist discontinue spironolactone. Past Medical History:  Diagnosis Date   Ankylosing spondylitis (HCC)    Anxiety and depression    Depression    Fibromyalgia    GERD (gastroesophageal reflux disease)    Hiatal hernia    per patient, dx by GI    History of basal cell carcinoma excision    FACE, 1992  &  1996   History of hiatal hernia    History of thrush    Hyperlipidemia    Hypertension    Insomnia    Left breast mass    OCD (obsessive compulsive disorder)    Osteopenia    PONV (postoperative nausea and vomiting)    Psoriatic arthritis (HCC)    PVC (premature ventricular contraction)    Rheumatoid arthritis (HCC)    Past Surgical History:  Procedure Laterality Date   BIOPSY  09/29/2019   Procedure: BIOPSY;  Surgeon: Kerin Salen, MD;  Location: [facility];  Service: Gastroenterology;;   BREAST EXCISIONAL BIOPSY Right    BREAST EXCISIONAL BIOPSY Left 05/04/2018   BREAST LUMPECTOMY WITH RADIOACTIVE SEED LOCALIZATION Left 05/04/2018   Procedure: LEFT BREAST LUMPECTOMY WITH RADIOACTIVE SEED LOCALIZATION AND LEFT BREAST NIPPLE BIOPSY;  Surgeon: Griselda Miner, MD;  Location: [facility];  Service: General;  Laterality: Left;   BREAST SURGERY  05/25/1973   lumpectomy-- benign   BUNIONECTOMY  05/25/1989   CATARACT EXTRACTION W/ INTRAOCULAR LENS  IMPLANT, BILATERAL  05/25/1998   CERVICAL FUSION  07/24/2011   C5 -- C7   CESAREAN SECTION  05/26/1983   COLONOSCOPY WITH PROPOFOL  N/A 09/29/2019   Procedure: COLONOSCOPY WITH PROPOFOL;  Surgeon: Kerin Salen, MD;  Location: [facility];  Service: Gastroenterology;  Laterality: N/A;   DISTAL INTERPHALANGEAL JOINT FUSION Right 03/14/2015   Procedure: RIGHT LONG FINGER DISTAL INTERPHALANGEAL JOINT ARTHRODESIS;  Surgeon: Bradly Bienenstock, MD;  Location: [facility];  Service: Orthopedics;  Laterality: Right;   ESOPHAGOGASTRODUODENOSCOPY (EGD) WITH PROPOFOL N/A 09/29/2019   Procedure: ESOPHAGOGASTRODUODENOSCOPY (EGD) WITH PROPOFOL;  Surgeon: Kerin Salen, MD;  Location: [facility];  Service: Gastroenterology;  Laterality: N/A;   EYE SURGERY  05/25/1993   rk (laser surgery), semi cornea transplant, detacted retina,  fluid removal   HYSTEROSCOPY WITH D & C N/A 12/09/2020   Procedure: DILATATION AND CURETTAGE /HYSTEROSCOPY;  Surgeon: Steva Ready, DO;  Location: [facility];  Service: Gynecology;  Laterality: N/A;   KNEE ARTHROSCOPY Left 03/03/2004   POLYPECTOMY  09/29/2019   Procedure: POLYPECTOMY;  Surgeon: Kerin Salen, MD;  Location: [facility];  Service: Gastroenterology;;   POSTERIOR VITRECTOMY RIGHT EYE AND LASER   10/27/1999   SHOULDER SURGERY Right 05/26/1995   SKIN BIOPSY  05/2022   lower stomach   SPINAL FIXATION SURGERY W/ IMPLANT  2013 rod #1//  2014  rod 2   S1 -- T10  (rod #1)//   S1 -- T4 (rod #2)   THORACIC FUSION  03/13/2013   REMOVAL HARDWARE/  BONE GRAFT FUSION T10//  REVISION OF RODS   TOTAL KNEE ARTHROPLASTY Left 12/14/2005   UVULOPALATOPHARYNGOPLASTY  04/26/2006   w/  TONSILLECTOMY/  TURBINATE REDUCTIONS/  BILATERAL ANTERIOR ETHMOIDECTOMY   Current Outpatient Medications on File Prior to Visit  Medication Sig Dispense Refill   albuterol (VENTOLIN HFA) 108 (90 Base) MCG/ACT inhaler TAKE 2 PUFFS BY MOUTH EVERY 6 HOURS AS NEEDED FOR WHEEZE OR SHORTNESS OF BREATH 8.5 each 1   Alirocumab (PRALUENT) 75 MG/ML SOAJ Inject 1 mL (75 mg total) into the skin every 14 (fourteen) days.  2 mL 11   amLODipine (NORVASC) 5 MG tablet Take 1 tablet (5 mg total) by mouth daily. 90 tablet 3   buPROPion (WELLBUTRIN XL) 300 MG 24 hr tablet Take 300 mg by mouth every morning.  1   clonazePAM (KLONOPIN) 1 MG tablet TAKE 1 TABLET BY MOUTH EVERY DAY AS NEEDED FOR ANXIETY (Patient taking differently: Take 1 mg by mouth daily as needed for anxiety.) 30 tablet 1   cyclobenzaprine (FLEXERIL) 10 MG tablet Take 1 tablet (10 mg total) by mouth 3 (three) times daily as needed for muscle spasms. 30 tablet 0   dexlansoprazole (DEXILANT) 60 MG capsule Take 1 capsule (60 mg total) by mouth daily. 90 capsule 0   doxazosin (CARDURA) 4 MG tablet TAKE 1 TABLET BY MOUTH EVERY DAY 90 tablet 1   doxepin (SINEQUAN) 50 MG capsule Take 50 mg by mouth.     ezetimibe (ZETIA) 10 MG tablet TAKE 1 TABLET BY MOUTH EVERY DAY 90 tablet 2   finasteride (PROSCAR) 5 MG tablet Take 1 tablet (5 mg total) by mouth daily. 90 tablet 1   FLUoxetine (PROZAC) 20 MG capsule Take 20 mg by mouth 3 (three) times daily.     fluticasone (FLONASE) 50 MCG/ACT nasal spray Place 2 sprays into both nostrils daily as needed for allergies. 16 g 11   hydrocortisone cream 1 % Apply 1 Application topically 2 (two) times daily. 30 g 0   INGREZZA 80 MG capsule Take one capsule by mouth daily 30 capsule 1   leflunomide (ARAVA) 20 MG tablet TAKE 1 TABLET BY MOUTH EVERY DAY 90 tablet 0   linaclotide (LINZESS) 72 MCG capsule Take 1 capsule (72 mcg total) by mouth daily before breakfast. (Patient taking differently: Take 72 mcg by mouth as needed.) 90 capsule 3   metoprolol succinate (TOPROL-XL) 50 MG 24 hr tablet Take with or immediately following a meal. 90 tablet 0   ondansetron (ZOFRAN) 4 MG tablet Take 4 mg by mouth 4 (four) times daily as needed for nausea.     oxyCODONE-acetaminophen (PERCOCET) 5-325 MG tablet Take 1 tablet by mouth every 8 (eight) hours as needed for severe pain. Ok to fill after 01/01/23 60 tablet 0   valbenazine (INGREZZA) 80 MG  capsule Take 1 capsule (80 mg total) by mouth daily. 90 capsule 3   valsartan (DIOVAN) 320 MG tablet TAKE 1 TABLET BY MOUTH EVERY DAY 90 tablet 3   No current facility-administered medications on file prior to visit.       Allergies  Allergen Reactions   Latex Rash    And Mouth sores Other reaction(s): rash Other reaction(s): rash Other reaction(s): rash   Amlodipine Besy-Benazepril Hcl Other (See Comments)    COUGH   Amoxicillin-Pot Clavulanate Diarrhea and Nausea And Vomiting    Other reaction(s): sick on stomach Other reaction(s): sick on stomach   Bextra [Valdecoxib] Diarrhea and Nausea And Vomiting    REFLUX   Chlorhexidine  rash Other reaction(s): rash all over body Other reaction(s): rash all over body   Hydrochlorothiazide     Low sodium   Lipitor [Atorvastatin Calcium] Other (See Comments)    MYALGIAS   Spironolactone     hyponatremia   Sulfa Antibiotics Nausea And Vomiting    CRAMPING Other reaction(s): stomach hurts Other reaction(s): stomach hurts   Zocor [Simvastatin] Other (See Comments)    MYALGIA   Social History   Socioeconomic History   Marital status: Married    Spouse name: Daryl   Number of children: Not on file   Years of education: Not on file   Highest education level: Not on file  Occupational History    Comment: retired  Tobacco Use   Smoking status: Former    Current packs/day: 0.00    Average packs/day: 0.5 packs/day for 10.0 years (5.0 ttl pk-yrs)    Types: Cigarettes    Start date: 03/06/1985    Quit date: 03/07/1995    Years since quitting: 27.8    Passive exposure: Never   Smokeless tobacco: Never  Vaping Use   Vaping status: Never Used  Substance and Sexual Activity   Alcohol use: Yes    Comment: rare   Drug use: Never   Sexual activity: Not on file  Other Topics Concern   Not on file  Social History Narrative   Lives with husband   Social Determinants of Health   Financial Resource Strain: Low Risk   (08/13/2022)   Overall Financial Resource Strain (CARDIA)    Difficulty of Paying Living Expenses: Not hard at all  Food Insecurity: No Food Insecurity (08/13/2022)   Hunger Vital Sign    Worried About Running Out of Food in the Last Year: Never true    Ran Out of Food in the Last Year: Never true  Transportation Needs: No Transportation Needs (08/13/2022)   PRAPARE - Administrator, Civil Service (Medical): No    Lack of Transportation (Non-Medical): No  Physical Activity: Inactive (08/13/2022)   Exercise Vital Sign    Days of Exercise per Week: 0 days    Minutes of Exercise per Session: 0 min  Stress: No Stress Concern Present (08/13/2022)   [employer] of Occupational Health - Occupational Stress Questionnaire    Feeling of Stress : Not at all  Social Connections: Socially Integrated (08/13/2022)   Social Connection and Isolation Panel [NHANES]    Frequency of Communication with Friends and Family: More than three times a week    Frequency of Social Gatherings with Friends and Family: More than three times a week    Attends Religious Services: More than 4 times per year    Active Member of [employer] or Organizations: Yes    Attends Banker Meetings: More than 4 times per year    Marital Status: Married  Catering manager Violence: Not At Risk (08/13/2022)   Humiliation, Afraid, Rape, and Kick questionnaire    Fear of Current or Ex-Partner: No    Emotionally Abused: No    Physically Abused: No    Sexually Abused: No    Review of Systems  All other systems reviewed and are negative.      Objective:   Physical Exam Vitals reviewed.  Constitutional:      General: She is not in acute distress.    Appearance: Normal appearance. She is normal weight. She is not ill-appearing or toxic-appearing.  HENT:     Right Ear: Tympanic membrane and ear  canal normal.     Left Ear: Tympanic membrane and ear canal normal.     Mouth/Throat:     Pharynx: Oropharynx is  clear. No oropharyngeal exudate or posterior oropharyngeal erythema.  Cardiovascular:     Rate and Rhythm: Normal rate and regular rhythm.     Heart sounds: Normal heart sounds. No murmur heard.    No friction rub. No gallop.  Pulmonary:     Effort: Pulmonary effort is normal. No respiratory distress.     Breath sounds: Normal breath sounds. No wheezing, rhonchi or rales.  Abdominal:     General: Abdomen is flat. Bowel sounds are normal. There is no distension.     Palpations: Abdomen is soft.     Tenderness: There is no abdominal tenderness.  Musculoskeletal:     Right lower leg: No edema.     Left lower leg: No edema.  Neurological:     Mental Status: She is alert.          Assessment & Plan:  Hyponatremia - Plan: BASIC METABOLIC PANEL WITH GFR Repeat a BMP to monitor sodium level.  If sodium level is normal, may try lower dose of spironolactone.  Could also consider Jardiance if sodium remains low

## 2023-01-19 ENCOUNTER — Telehealth: Payer: Self-pay | Admitting: Family Medicine

## 2023-01-19 NOTE — Telephone Encounter (Signed)
Patient called to follow up on newly prescribed medication SEMAGLUTIDE; reporting nausea. Patient requesting for provider to prescribe something to manage side effect of nausea.  Pharmacy confirmed as:  [id number] [place], [place] - [address] [facility] [address] [facility] [address] [place] [address] Phone: [phone]  Fax: [phone] DEA #: [id number]   Please advise at [phone].

## 2023-01-20 ENCOUNTER — Ambulatory Visit: Payer: Medicare Other | Attending: Family Medicine

## 2023-01-20 DIAGNOSIS — I10 Essential (primary) hypertension: Secondary | ICD-10-CM

## 2023-01-20 DIAGNOSIS — E7800 Pure hypercholesterolemia, unspecified: Secondary | ICD-10-CM

## 2023-01-20 LAB — COMPREHENSIVE METABOLIC PANEL
ALT: 5 IU/L (ref 0–32)
AST: 10 IU/L (ref 0–40)
Albumin: 3.7 g/dL — ABNORMAL LOW (ref 3.8–4.8)
Alkaline Phosphatase: 116 IU/L (ref 44–121)
BUN/Creatinine Ratio: 11 — ABNORMAL LOW (ref 12–28)
BUN: 9 mg/dL (ref 8–27)
Bilirubin Total: 0.2 mg/dL (ref 0.0–1.2)
CO2: 24 mmol/L (ref 20–29)
Calcium: 9.9 mg/dL (ref 8.7–10.3)
Chloride: 101 mmol/L (ref 96–106)
Creatinine, Ser: 0.81 mg/dL (ref 0.57–1.00)
Globulin, Total: 2.3 g/dL (ref 1.5–4.5)
Glucose: 104 mg/dL — ABNORMAL HIGH (ref 70–99)
Potassium: 4 mmol/L (ref 3.5–5.2)
Sodium: 139 mmol/L (ref 134–144)
Total Protein: 6 g/dL (ref 6.0–8.5)
eGFR: 77 mL/min/{1.73_m2} (ref >59)

## 2023-01-20 LAB — LIPID PANEL
Chol/HDL Ratio: 3.1 ratio (ref 0.0–4.4)
Cholesterol, Total: 173 mg/dL (ref 100–199)
HDL: 55 mg/dL (ref >39)
LDL Chol Calc (NIH): 96 mg/dL (ref 0–99)
Triglycerides: 124 mg/dL (ref 0–149)
VLDL Cholesterol Cal: 22 mg/dL (ref 5–40)

## 2023-01-21 ENCOUNTER — Other Ambulatory Visit: Payer: Self-pay | Admitting: Family Medicine

## 2023-01-21 MED ORDER — ONDANSETRON HCL 4 MG PO TABS: 4.0000 mg | ORAL_TABLET | Freq: Four times a day (QID) | ORAL | 1 refills | Status: DC | PRN

## 2023-01-22 ENCOUNTER — Telehealth: Payer: Self-pay | Admitting: Pharmacist

## 2023-01-22 NOTE — Telephone Encounter (Signed)
Called pt to review labs.  Sodium has normalized off of spironolactone, BP was stable at PCP appt on 12/31/22.  LDL has improved from 142 to 96 after starting Praluent (only a 32% decrease), continues on ezetimibe. LDL goal < 70. Previously intolerant to atorvastatin, simvastatin, pravastatin 20mg  daily and once weekly. Confirmed med adherence with pt to Praluent and ezetimibe, has given 3 doses. Discussed could continue on same dose and see if LDL drops more the longer she's on it, or try increasing Praluent to 150mg . Copay was high so would not change ezetimibe to Nexlizet which would be the other option we have to reduce LDL further since it would be cost prohibitive. Pt prefers to continue on same dose of Praluent for now. Advised pt if LDL remains > 70 the next time her cholesterol is checked, can increase Praluent to 150mg  at that time. She was appreciative for the call.

## 2023-02-01 ENCOUNTER — Other Ambulatory Visit: Payer: Self-pay

## 2023-02-01 MED ORDER — OXYCODONE-ACETAMINOPHEN 5-325 MG PO TABS: 1.0000 | ORAL_TABLET | Freq: Three times a day (TID) | ORAL | 0 refills | Status: DC | PRN

## 2023-02-01 NOTE — Telephone Encounter (Signed)
Pt called in to get this med oxyCODONE-acetaminophen (PERCOCET) 5-325 MG tablet [[id number]] refilled.  LOV: 12/31/22  PHARMACY: [id number] [place], [place] - [address] [facility] [address] [place] [address] Phone: [phone]  Fax: [phone]     CB#: [phone]

## 2023-02-11 ENCOUNTER — Encounter: Payer: Self-pay | Admitting: Cardiovascular Disease

## 2023-02-11 NOTE — Progress Notes (Signed)
Cardiology Office Note:  .   Date:  02/12/2023  ID:  Janet Peterson, DOB 12/12/1950, MRN [id number] PCP: Janet Brooks, MD   HeartCare Providers Cardiologist: Janet Peterson  Click to update primary MD,subspecialty MD or APP then REFRESH:1}   History of Present Illness: .    Seen with Janet Peterson ( husband)   Janet Peterson is a 72 y.o. female with a history of hypertension, dyspnea, fibromyalgia,  anemia ,  psoriasis ( with arthritis) .  We are asked to see her today by Dr. Tanya Peterson for further evaluation of her dyspnea and elevated BNP   She presented to her primary care doctor with some shortness of breath.  D-dimer was noted to be elevated. CTA of the lungs revealed no evidence of pulmonary emboli.  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  BP has been running high. 170s Was started on amlodipine and her blood pressure is 132/82 today. She avoids salt but she notes that they go out to eat 5 times a week   She has been on a diuretic in the past   Is starting back on her exercise  Went to [place] for 17 days     Sept. 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  She has had difficulty taking diuretics in the past.  I discussed the Janet Peterson, RPH:   Doxepin - TCA Tricyclic antidepressants (TCAs) have been rarely associated with syndrome of inappropriate antidiuretic hormone secretion (SIADH) and/or  hyponatremia, predominately in older adults            ROS:   Studies Reviewed: .         Risk Assessment/Calculations:         Physical Exam: Blood pressure (!) 132/96, pulse 94, height 5\' 3"  (1.6 m), weight 197 lb 9.6 oz (89.6 kg), SpO2 96%.     GEN:  Well nourished, well developed in no acute distress HEENT: Normal NECK: No JVD; No carotid bruits LYMPHATICS: No lymphadenopathy CARDIAC: RRR , no murmurs, rubs, gallops RESPIRATORY:  Clear to auscultation without rales, wheezing or rhonchi  ABDOMEN: Soft, non-tender, non-distended MUSCULOSKELETAL:  No edema; No deformity  SKIN: Warm and dry NEUROLOGIC:  Alert and oriented x 3   ASSESSMENT AND PLAN: .     1.  Shortness of breath with exertion: She recently seen by her primary medical doctor for shortness of breath.  She had a mildly elevated B natruretic peptide and also an mildly elevated D-dimer study.  She has already had her CTA of her lungs which revealed no evidence of pulmonary emboli.    2.  Hypertension:  diastolic HTN   3.  Hyponatremia :   We have had difficulty in keeping her on a diuretic because of her hyponatremia.  I discussed the case with our clinical pharmacologist.  I have posted her note  below.  I discussed the Janet Peterson, RPH:   Doxepin - TCA Tricyclic antidepressants (TCAs) have been rarely associated with syndrome of inappropriate antidiuretic hormone secretion (SIADH) and/or hyponatremia, predominately in older adults    also I noticed fluoxetine - SSRI tend to do that too in elderly pts   So her Prozac and her Doxepin may be contributing to her hyponatremia .         Dispo:  3 months   Signed, Janet Miss, MD

## 2023-02-12 ENCOUNTER — Ambulatory Visit: Payer: Medicare Other | Attending: Cardiovascular Disease | Admitting: Cardiovascular Disease

## 2023-02-12 ENCOUNTER — Encounter: Payer: Self-pay | Admitting: Cardiovascular Disease

## 2023-02-12 VITALS — BP 132/96 | HR 94 | Ht 63.0 in | Wt 197.6 lb

## 2023-02-12 DIAGNOSIS — Z01810 Encounter for preprocedural cardiovascular examination: Secondary | ICD-10-CM

## 2023-02-12 DIAGNOSIS — R0609 Other forms of dyspnea: Secondary | ICD-10-CM | POA: Diagnosis not present

## 2023-02-12 DIAGNOSIS — I2089 Other forms of angina pectoris: Secondary | ICD-10-CM | POA: Diagnosis not present

## 2023-02-12 MED ORDER — METOPROLOL TARTRATE 100 MG PO TABS
ORAL_TABLET | ORAL | 0 refills | Status: DC
Start: 2023-02-12 — End: 2023-06-02

## 2023-02-12 NOTE — Patient Instructions (Addendum)
Medication Instructions:  Your physician recommends that you continue on your current medications as directed. Please refer to the Current Medication list given to you today.  *If you need a refill on your cardiac medications before your next appointment, please call your pharmacy*  Lab Work: BMET today If you have labs (blood work) drawn today and your tests are completely normal, you will receive your results only by: MyChart Message (if you have MyChart) OR A paper copy in the mail If you have any lab test that is abnormal or we need to change your treatment, we will call you to review the results.  Testing/Procedures: Coronary CT Angiogram  Non-Cardiac CT Angiography (CTA), is a special type of CT scan that uses a computer to produce multi-dimensional views of major blood vessels throughout the body. In CT angiography, a contrast material is injected through an IV to help visualize the blood vessels  Follow-Up: At [facility], you and your health needs are our priority.  As part of our continuing mission to provide you with exceptional heart care, we have created designated Provider Care Teams.  These Care Teams include your primary Cardiologist (physician) and Advanced Practice Providers (APPs -  Physician Assistants and Nurse Practitioners) who all work together to provide you with the care you need, when you need it.  Your next appointment:   3 month(s)  Provider:   Kristeen Miss, MD   Other Instructions   Your cardiac CT will be scheduled at:   [facility] [address] [place], [place] [address] [phone]  Please arrive at the [facility] and Children's Entrance (Entrance C2) of [facility] 30 minutes prior to test start time. You can use the FREE valet parking offered at entrance C (encouraged to control the heart rate for the test)  Proceed to the [facility] Radiology Department (first floor) to check-in and test prep.  All radiology  patients and guests should use entrance C2 at [facility], accessed from [facility], even though the hospital's physical address listed is [address].     Please follow these instructions carefully (unless otherwise directed):  On the Night Before the Test: Be sure to Drink plenty of water. Do not consume any caffeinated/decaffeinated beverages or chocolate 12 hours prior to your test. Do not take any antihistamines 12 hours prior to your test.  On the Day of the Test: Drink plenty of water until 1 hour prior to the test. Do not eat any food 1 hour prior to test. You may take your regular medications prior to the test.  Take metoprolol (Lopressor) two hours prior to test. FEMALES- please wear underwire-free bra if available, avoid dresses & tight clothing      After the Test: Drink plenty of water. After receiving IV contrast, you may experience a mild flushed feeling. This is normal. On occasion, you may experience a mild rash up to 24 hours after the test. This is not dangerous. If this occurs, you can take Benadryl 25 mg and increase your fluid intake. If you experience trouble breathing, this can be serious. If it is severe call 911 IMMEDIATELY. If it is mild, please call our office. If you take any of these medications: Glipizide/Metformin, Avandament, Glucavance, please do not take 48 hours after completing test unless otherwise instructed.  We will call to schedule your test 2-4 weeks out understanding that some insurance companies will need an authorization prior to the service being performed.  For more information and frequently asked questions, please visit our website : [email or web address]  For non-scheduling related questions, please contact the cardiac imaging nurse navigator should you have any questions/concerns: Cardiac Imaging Nurse Navigators Direct Office Dial: [phone]   For scheduling needs, including  cancellations and rescheduling, please call [place], [phone].

## 2023-02-13 LAB — BASIC METABOLIC PANEL
BUN/Creatinine Ratio: 8 — ABNORMAL LOW (ref 12–28)
BUN: 7 mg/dL — ABNORMAL LOW (ref 8–27)
CO2: 22 mmol/L (ref 20–29)
Calcium: 9.9 mg/dL (ref 8.7–10.3)
Chloride: 99 mmol/L (ref 96–106)
Creatinine, Ser: 0.91 mg/dL (ref 0.57–1.00)
Glucose: 88 mg/dL (ref 70–99)
Potassium: 4.4 mmol/L (ref 3.5–5.2)
Sodium: 138 mmol/L (ref 134–144)
eGFR: 67 mL/min/{1.73_m2} (ref >59)

## 2023-02-23 ENCOUNTER — Other Ambulatory Visit: Payer: Self-pay | Admitting: Family Medicine

## 2023-02-24 NOTE — Telephone Encounter (Signed)
Requested Prescriptions  Pending Prescriptions Disp Refills   metoprolol succinate (TOPROL-XL) 50 MG 24 hr tablet [Pharmacy Med Name: METOPROLOL SUCC ER 50 MG TAB] 90 tablet 0    Sig: TAKE 1 TABLET BY MOUTH DAILY WITH OR IMMEDIATELY FOLLOWING A MEAL.     Cardiovascular:  Beta Blockers Failed - 02/23/2023  1:43 PM      Failed - Last BP in normal range    BP Readings from Last 1 Encounters:  02/12/23 (!) 132/96         Failed - Valid encounter within last 6 months    Recent Outpatient Visits           1 year ago Hyponatremia   [facility] Tanya Nones, [name], MD   1 year ago Bronchitis   [facility] Tanya Nones, [name], MD   1 year ago Bronchitis   [facility] Tanya Nones, [name], MD   1 year ago Anemia, unspecified type   [facility] Donita Brooks, MD   1 year ago Acute non-recurrent pansinusitis   [facility] Valentino Nose, NP       Future Appointments             In 1 month [name] [facility] Rheumatology   In 2 months Nahser, Deloris Ping, MD [facility]            Passed - Last Heart Rate in normal range    Pulse Readings from Last 1 Encounters:  02/12/23 94

## 2023-03-02 ENCOUNTER — Encounter (HOSPITAL_COMMUNITY): Payer: Self-pay

## 2023-03-02 ENCOUNTER — Ambulatory Visit (INDEPENDENT_AMBULATORY_CARE_PROVIDER_SITE_OTHER): Payer: Medicare Other | Admitting: Audiology

## 2023-03-02 ENCOUNTER — Telehealth (HOSPITAL_COMMUNITY): Payer: Self-pay | Admitting: Emergency Medicine

## 2023-03-02 ENCOUNTER — Encounter (INDEPENDENT_AMBULATORY_CARE_PROVIDER_SITE_OTHER): Payer: Self-pay | Admitting: Otolaryngology

## 2023-03-02 ENCOUNTER — Ambulatory Visit (INDEPENDENT_AMBULATORY_CARE_PROVIDER_SITE_OTHER): Payer: Medicare Other | Admitting: Otolaryngology

## 2023-03-02 VITALS — Ht 63.0 in | Wt 197.0 lb

## 2023-03-02 DIAGNOSIS — H6123 Impacted cerumen, bilateral: Secondary | ICD-10-CM | POA: Diagnosis not present

## 2023-03-02 DIAGNOSIS — H903 Sensorineural hearing loss, bilateral: Secondary | ICD-10-CM | POA: Diagnosis not present

## 2023-03-02 NOTE — Progress Notes (Unsigned)
[facility] [address] [place], [place] [address]   Audiological Evaluation    History: Bahar Shelden was referred today for a hearing evaluation by Dr. Janeece Riggers Philomena Doheny. She was previously seen at [facility] for an audiological evaluation on August 2nd, 2023 due to concerns of hearing loss and clogging sensations.  Otoscopy: Right ear: Non-occluding cerumen; visualized notable landmarks on the tympanic membrane. Left ear: Non-occluding cerumen; visualized notable landmarks on the tympanic membrane.   Tympanogram: Right ear: Normal external ear canal volume with normal middle ear pressure and tympanic membrane compliance (Type A). Left ear: Normal external ear canal volume with normal middle ear pressure and low tympanic membrane compliance (Type As).   Hearing Evaluation: The audiogram was completed using conventional audiometric techniques under headphones with good reliability.   The hearing test results indicate: Right ear: Normal hearing sensitivity from 250-500 Hz gradually sloping to moderate sensorineural hearing loss from 1000-8000 Hz. Left ear: Normal hearing sensitivity at 250 Hz gradually sloping to moderately-severe sensorineural hearing loss from [phone] Hz with an air bone gap at 4000 Hz.   Speech Recognition Thresholds were obtained at 30 dBHL in the right ear and 35 dBHL in the left ear.   Word Recognition Testing was completed with NU-6 word lists at 70 dBHL in the right ear and at 75 dBHL in the left ear and the patient scored 88% in the right ear and 96% in the left ear.   Recommendations: Repeat audiogram when changes are perceived or per MD. Patient is a candidate for hearing amplification, consider a hearing aid evaluation.   Conley Rolls Amazin Pincock, AUD, CCC-A 03/02/23

## 2023-03-02 NOTE — Telephone Encounter (Signed)
Reaching out to patient to offer assistance regarding upcoming cardiac imaging study; pt verbalizes understanding of appt date/time, parking situation and where to check in, pre-test NPO status and medications ordered, and verified current allergies; name and call back number provided for further questions should they arise Cayne Yom RN Navigator Cardiac Imaging [facility] [phone] office [phone] cell 

## 2023-03-03 DIAGNOSIS — H903 Sensorineural hearing loss, bilateral: Secondary | ICD-10-CM | POA: Insufficient documentation

## 2023-03-03 DIAGNOSIS — H6123 Impacted cerumen, bilateral: Secondary | ICD-10-CM | POA: Insufficient documentation

## 2023-03-03 NOTE — Progress Notes (Signed)
Patient ID: Janet Peterson, female   DOB: 1950/08/04, 72 y.o.   MRN: [id number]  Follow-up: Hearing loss, cerumen impaction  HPI: The patient is a 72 year old female who presents today for her follow-up evaluation.  She was last seen 1 year ago.  At that time, she was complaining of clogging sensation in her ears and bilateral hearing loss.  She was noted to have bilateral cerumen impaction and bilateral high-frequency sensorineural hearing loss.  The hearing amplification options were discussed.  According to the patient, she continues to have hearing difficulty.  She denies any significant otalgia, otorrhea, or vertigo.  Exam: General: Communicates without difficulty, well nourished, no acute distress. Head: Normocephalic, no evidence injury, no tenderness, facial buttresses intact without stepoff. Face/sinus: No tenderness to palpation and percussion. Facial movement is normal and symmetric. Eyes: PERRL, EOMI. No scleral icterus, conjunctivae clear. Neuro: CN II exam reveals vision grossly intact.  No nystagmus at any point of gaze. EAC: Bilateral cerumen impaction.  Under the operating microscope, the cerumen is carefully removed with a combination of cerumen currette, alligator forceps, and suction catheters.  After the cerumen is removed, the TMs are noted to be normal. Nose: External evaluation reveals normal support and skin without lesions.  Dorsum is intact.  Anterior rhinoscopy reveals pink mucosa over anterior aspect of inferior turbinates and intact septum.  No purulence noted. Oral:  Oral cavity and oropharynx are intact, symmetric, without erythema or edema.  Mucosa is moist without lesions. Neck: Full range of motion without pain.  There is no significant lymphadenopathy.  No masses palpable.  Thyroid bed within normal limits to palpation.  Parotid glands and submandibular glands equal bilaterally without mass.  Trachea is midline. Neuro:  CN 2-12 grossly intact. Gait normal.   Procedure:  Bilateral cerumen disimpaction Anesthesia: None Description: Under the operating microscope, the cerumen is carefully removed with a combination of cerumen currette, alligator forceps, and suction catheters.  After the cerumen is removed, the TMs are noted to be normal.  No mass, erythema, or lesions. The patient tolerated the procedure well.   AUDIOMETRIC TESTING: I have read and reviewed the audiometric test, which shows bilateral high-frequency sensorineural hearing loss. The speech reception threshold is 30dB AD and 35dB AS. The discrimination score is 88% AD and 96% AS.  The tympanogram is normal bilaterally.  Assessment: 1.  Bilateral cerumen impaction.  After the disimpaction procedure, both tympanic membranes and middle ear spaces are noted to be normal.  2.  Stable bilateral high frequency sensorineural hearing loss, likely secondary to routine presbycusis.   Plan  1.  Otomicroscopy with bilateral cerumen disimpaction.  2.  The physical exam findings and the hearing test results are reviewed with the patient.  3.  The patient is a candidate for hearing amplification.  The hearing aid options are discussed.   4.  The patient will return for re-evaluation in 1 year.

## 2023-03-04 ENCOUNTER — Telehealth: Payer: Self-pay

## 2023-03-04 ENCOUNTER — Ambulatory Visit (HOSPITAL_COMMUNITY)
Admission: RE | Admit: 2023-03-04 | Discharge: 2023-03-04 | Disposition: A | Discharge status: Home / Self Care | Payer: Medicare Other | Source: Ambulatory Visit | Attending: Cardiovascular Disease | Admitting: Cardiovascular Disease

## 2023-03-04 DIAGNOSIS — I2089 Other forms of angina pectoris: Secondary | ICD-10-CM | POA: Diagnosis present

## 2023-03-04 DIAGNOSIS — R0609 Other forms of dyspnea: Secondary | ICD-10-CM | POA: Diagnosis present

## 2023-03-04 DIAGNOSIS — Z01810 Encounter for preprocedural cardiovascular examination: Secondary | ICD-10-CM | POA: Insufficient documentation

## 2023-03-04 MED ORDER — METOPROLOL TARTRATE 5 MG/5ML IV SOLN
10.0000 mg | Freq: Once | INTRAVENOUS | Status: AC
  Administered 2023-03-04: 10 mg via INTRAVENOUS

## 2023-03-04 MED ORDER — NITROGLYCERIN 0.4 MG SL SUBL
SUBLINGUAL_TABLET | SUBLINGUAL | Status: AC
  Filled 2023-03-04: qty 2

## 2023-03-04 MED ORDER — METOPROLOL TARTRATE 5 MG/5ML IV SOLN
INTRAVENOUS | Status: AC
  Filled 2023-03-04: qty 10

## 2023-03-04 MED ORDER — METOPROLOL TARTRATE 5 MG/5ML IV SOLN
5.0000 mg | Freq: Once | INTRAVENOUS | Status: AC
  Administered 2023-03-04: 5 mg via INTRAVENOUS

## 2023-03-04 MED ORDER — NITROGLYCERIN 0.4 MG SL SUBL
0.8000 mg | SUBLINGUAL_TABLET | Freq: Once | SUBLINGUAL | Status: AC
  Administered 2023-03-04: 0.8 mg via SUBLINGUAL

## 2023-03-04 MED ORDER — IOHEXOL 350 MG/ML SOLN
95.0000 mL | Freq: Once | INTRAVENOUS | Status: AC | PRN
  Administered 2023-03-04: 95 mL via INTRAVENOUS

## 2023-03-04 MED ORDER — DILTIAZEM HCL 25 MG/5ML IV SOLN
5.0000 mg | Freq: Once | INTRAVENOUS | Status: AC
  Administered 2023-03-04: 5 mg via INTRAVENOUS

## 2023-03-04 MED ORDER — DILTIAZEM HCL 25 MG/5ML IV SOLN
INTRAVENOUS | Status: AC
  Filled 2023-03-04: qty 5

## 2023-03-04 NOTE — OR Nursing (Signed)
Notified Dr. Lucita Lora about patients elevated heart rate. Verbal order for diltiazem was given. Pt does have an intolerance to Amlodipine but just cause a cough. Therefore, Diltiazem shouldn't interfere with allergy.

## 2023-03-04 NOTE — Telephone Encounter (Signed)
Pt called in to request a refill of this med oxyCODONE-acetaminophen (PERCOCET) 5-325 MG tablet [[id number]]  LOV: 12/31/22  PHARMACY: [id number] [place],  - [address] [place] [address] AT [address] [place] [address] Phone: [phone]  Fax: [phone]    CB#: [phone]

## 2023-03-05 ENCOUNTER — Other Ambulatory Visit: Payer: Self-pay | Admitting: Family Medicine

## 2023-03-05 MED ORDER — OXYCODONE-ACETAMINOPHEN 5-325 MG PO TABS: 1.0000 | ORAL_TABLET | Freq: Three times a day (TID) | ORAL | 0 refills | Status: DC | PRN

## 2023-03-15 ENCOUNTER — Other Ambulatory Visit: Payer: Self-pay | Admitting: Family Medicine

## 2023-03-16 ENCOUNTER — Ambulatory Visit (INDEPENDENT_AMBULATORY_CARE_PROVIDER_SITE_OTHER): Payer: Medicare Other | Admitting: Family Medicine

## 2023-03-16 ENCOUNTER — Encounter: Payer: Self-pay | Admitting: Family Medicine

## 2023-03-16 VITALS — BP 120/72 | HR 91 | Temp 97.7°F | Ht 63.0 in | Wt 194.0 lb

## 2023-03-16 DIAGNOSIS — Z23 Encounter for immunization: Secondary | ICD-10-CM

## 2023-03-16 DIAGNOSIS — R0609 Other forms of dyspnea: Secondary | ICD-10-CM

## 2023-03-16 LAB — CBC WITH DIFFERENTIAL/PLATELET
Absolute Lymphocytes: 1742 {cells}/uL (ref 850–3900)
Absolute Monocytes: 552 {cells}/uL (ref 200–950)
Basophils Absolute: 81 {cells}/uL (ref 0–200)
Basophils Relative: 1.3 %
Eosinophils Absolute: 229 {cells}/uL (ref 15–500)
Eosinophils Relative: 3.7 %
HCT: 36.8 % (ref 35.0–45.0)
Hemoglobin: 11.8 g/dL (ref 11.7–15.5)
MCH: 31 pg (ref 27.0–33.0)
MCHC: 32.1 g/dL (ref 32.0–36.0)
MCV: 96.6 fL (ref 80.0–100.0)
MPV: 10.5 fL (ref 7.5–12.5)
Monocytes Relative: 8.9 %
Neutro Abs: 3596 {cells}/uL (ref 1500–7800)
Neutrophils Relative %: 58 %
Platelets: 270 10*3/uL (ref 140–400)
RBC: 3.81 10*6/uL (ref 3.80–5.10)
RDW: 13.3 % (ref 11.0–15.0)
Total Lymphocyte: 28.1 %
WBC: 6.2 10*3/uL (ref 3.8–10.8)

## 2023-03-16 NOTE — Progress Notes (Signed)
Subjective:    Patient ID: Janet Peterson, female    DOB: 31-Jul-1950, 72 y.o.   MRN: [id number]  Patient is here today continuing to complain of dyspnea on exertion.  I saw the patient in May initially for this.  This occurred after a plane flight to [place].  Therefore I got a CT angiogram to rule out pulmonary embolism which was negative.  There were also no structural abnormalities which were significant seen in the lung.  The patient has subsequently seen her cardiologist.  She did have a CT scan of the coronary arteries and although her coronary artery calcium score is elevated, she does not have any flow-limiting lesion in the coronary arteries.  Therefore cardiology did not feel that her shortness of breath was related to her heart.  They recommended that she see a pulmonologist.  She denies any wheezing.  She denies any cough.  She denies any fevers or chills.  She states that she gets short of breath simply walking to the car.  She is able to do very little without becoming winded.  However the patient is extremely sedentary due to her chronic back pain Past Medical History:  Diagnosis Date   Ankylosing spondylitis (HCC)    Anxiety and depression    Depression    Fibromyalgia    GERD (gastroesophageal reflux disease)    Hiatal hernia    per patient, dx by GI    History of basal cell carcinoma excision    FACE, 1992  &  1996   History of hiatal hernia    History of thrush    Hyperlipidemia    Hypertension    Insomnia    Left breast mass    OCD (obsessive compulsive disorder)    Osteopenia    PONV (postoperative nausea and vomiting)    Psoriatic arthritis (HCC)    PVC (premature ventricular contraction)    Rheumatoid arthritis (HCC)    Past Surgical History:  Procedure Laterality Date   BIOPSY  09/29/2019   Procedure: BIOPSY;  Surgeon: Kerin Salen, MD;  Location: [facility];  Service: Gastroenterology;;   BREAST EXCISIONAL BIOPSY Right    BREAST EXCISIONAL BIOPSY Left  05/04/2018   BREAST LUMPECTOMY WITH RADIOACTIVE SEED LOCALIZATION Left 05/04/2018   Procedure: LEFT BREAST LUMPECTOMY WITH RADIOACTIVE SEED LOCALIZATION AND LEFT BREAST NIPPLE BIOPSY;  Surgeon: Griselda Miner, MD;  Location: [facility];  Service: General;  Laterality: Left;   BREAST SURGERY  05/25/1973   lumpectomy-- benign   BUNIONECTOMY  05/25/1989   CATARACT EXTRACTION W/ INTRAOCULAR LENS  IMPLANT, BILATERAL  05/25/1998   CERVICAL FUSION  07/24/2011   C5 -- C7   CESAREAN SECTION  05/26/1983   COLONOSCOPY WITH PROPOFOL N/A 09/29/2019   Procedure: COLONOSCOPY WITH PROPOFOL;  Surgeon: Kerin Salen, MD;  Location: [facility];  Service: Gastroenterology;  Laterality: N/A;   DISTAL INTERPHALANGEAL JOINT FUSION Right 03/14/2015   Procedure: RIGHT LONG FINGER DISTAL INTERPHALANGEAL JOINT ARTHRODESIS;  Surgeon: Bradly Bienenstock, MD;  Location: [facility];  Service: Orthopedics;  Laterality: Right;   ESOPHAGOGASTRODUODENOSCOPY (EGD) WITH PROPOFOL N/A 09/29/2019   Procedure: ESOPHAGOGASTRODUODENOSCOPY (EGD) WITH PROPOFOL;  Surgeon: Kerin Salen, MD;  Location: [facility];  Service: Gastroenterology;  Laterality: N/A;   EYE SURGERY  05/25/1993   rk (laser surgery), semi cornea transplant, detacted retina,  fluid removal   HYSTEROSCOPY WITH D & C N/A 12/09/2020   Procedure: DILATATION AND CURETTAGE /HYSTEROSCOPY;  Surgeon: Steva Ready, DO;  Location: [facility];  Service: Gynecology;  Laterality: N/A;   KNEE ARTHROSCOPY Left 03/03/2004   POLYPECTOMY  09/29/2019   Procedure: POLYPECTOMY;  Surgeon: Kerin Salen, MD;  Location: [facility];  Service: Gastroenterology;;   POSTERIOR VITRECTOMY RIGHT EYE AND LASER   10/27/1999   SHOULDER SURGERY Right 05/26/1995   SKIN BIOPSY  05/2022   lower stomach   SPINAL FIXATION SURGERY W/ IMPLANT  2013 rod #1//  2014  rod 2   S1 -- T10  (rod #1)//   S1 -- T4 (rod #2)   THORACIC FUSION  03/13/2013   REMOVAL  HARDWARE/  BONE GRAFT FUSION T10//  REVISION OF RODS   TOTAL KNEE ARTHROPLASTY Left 12/14/2005   UVULOPALATOPHARYNGOPLASTY  04/26/2006   w/  TONSILLECTOMY/  TURBINATE REDUCTIONS/  BILATERAL ANTERIOR ETHMOIDECTOMY   Current Outpatient Medications on File Prior to Visit  Medication Sig Dispense Refill   albuterol (VENTOLIN HFA) 108 (90 Base) MCG/ACT inhaler TAKE 2 PUFFS BY MOUTH EVERY 6 HOURS AS NEEDED FOR WHEEZE OR SHORTNESS OF BREATH 8.5 each 1   Alirocumab (PRALUENT) 75 MG/ML SOAJ Inject 1 mL (75 mg total) into the skin every 14 (fourteen) days. 2 mL 11   amLODipine (NORVASC) 5 MG tablet Take 1 tablet (5 mg total) by mouth daily. 90 tablet 3   buPROPion (WELLBUTRIN XL) 300 MG 24 hr tablet Take 300 mg by mouth every morning.  1   clonazePAM (KLONOPIN) 1 MG tablet TAKE 1 TABLET BY MOUTH EVERY DAY AS NEEDED FOR ANXIETY (Patient taking differently: Take 1 mg by mouth daily as needed for anxiety.) 30 tablet 1   cyclobenzaprine (FLEXERIL) 10 MG tablet Take 1 tablet (10 mg total) by mouth 3 (three) times daily as needed for muscle spasms. 30 tablet 0   dexlansoprazole (DEXILANT) 60 MG capsule Take 1 capsule (60 mg total) by mouth daily. 90 capsule 0   doxazosin (CARDURA) 4 MG tablet TAKE 1 TABLET BY MOUTH EVERY DAY 90 tablet 1   doxepin (SINEQUAN) 50 MG capsule Take 50 mg by mouth.     ezetimibe (ZETIA) 10 MG tablet TAKE 1 TABLET BY MOUTH EVERY DAY 90 tablet 2   finasteride (PROSCAR) 5 MG tablet Take 1 tablet (5 mg total) by mouth daily. 90 tablet 1   FLUoxetine (PROZAC) 20 MG capsule Take 20 mg by mouth 3 (three) times daily.     fluticasone (FLONASE) 50 MCG/ACT nasal spray Place 2 sprays into both nostrils daily as needed for allergies. 16 g 11   hydrocortisone cream 1 % Apply 1 Application topically 2 (two) times daily. 30 g 0   INGREZZA 80 MG capsule Take one capsule by mouth daily 30 capsule 1   leflunomide (ARAVA) 20 MG tablet TAKE 1 TABLET BY MOUTH EVERY DAY 90 tablet 0   linaclotide  (LINZESS) 72 MCG capsule Take 1 capsule (72 mcg total) by mouth daily before breakfast. (Patient taking differently: Take 72 mcg by mouth as needed.) 90 capsule 3   metoprolol succinate (TOPROL-XL) 50 MG 24 hr tablet TAKE 1 TABLET BY MOUTH DAILY WITH OR IMMEDIATELY FOLLOWING A MEAL. 90 tablet 0   metoprolol tartrate (LOPRESSOR) 100 MG tablet Take 1 tablet by mouth two hours prior to scan 1 tablet 0   ondansetron (ZOFRAN) 4 MG tablet Take 1 tablet (4 mg total) by mouth 4 (four) times daily as needed for nausea. 20 tablet 1   oxyCODONE-acetaminophen (PERCOCET) 5-325 MG tablet Take 1 tablet by mouth every 8 (eight) hours as  needed for severe pain. Ok to fill after 01/01/23 60 tablet 0   valbenazine (INGREZZA) 80 MG capsule Take 1 capsule (80 mg total) by mouth daily. 90 capsule 3   valsartan (DIOVAN) 320 MG tablet TAKE 1 TABLET BY MOUTH EVERY DAY 90 tablet 3   No current facility-administered medications on file prior to visit.   Allergies  Allergen Reactions   Latex Rash    And Mouth sores Other reaction(s): rash Other reaction(s): rash Other reaction(s): rash   Amlodipine Besy-Benazepril Hcl Other (See Comments)    COUGH   Amoxicillin-Pot Clavulanate Diarrhea and Nausea And Vomiting    Other reaction(s): sick on stomach Other reaction(s): sick on stomach   Bextra [Valdecoxib] Diarrhea and Nausea And Vomiting    REFLUX   Chlorhexidine     rash Other reaction(s): rash all over body Other reaction(s): rash all over body   Hydrochlorothiazide     Low sodium   Lipitor [Atorvastatin Calcium] Other (See Comments)    MYALGIAS   Spironolactone     hyponatremia   Sulfa Antibiotics Nausea And Vomiting    CRAMPING Other reaction(s): stomach hurts Other reaction(s): stomach hurts   Zocor [Simvastatin] Other (See Comments)    MYALGIA   Social History   Socioeconomic History   Marital status: Married    Spouse name: Daryl   Number of children: Not on file   Years of education: Not on  file   Highest education level: Bachelor's degree (e.g., BA, AB, BS)  Occupational History    Comment: retired  Tobacco Use   Smoking status: Former    Current packs/day: 0.00    Average packs/day: 0.5 packs/day for 10.0 years (5.0 ttl pk-yrs)    Types: Cigarettes    Start date: 03/06/1985    Quit date: 03/07/1995    Years since quitting: 28.0    Passive exposure: Never   Smokeless tobacco: Never  Vaping Use   Vaping status: Never Used  Substance and Sexual Activity   Alcohol use: Yes    Comment: rare   Drug use: Never   Sexual activity: Not on file  Other Topics Concern   Not on file  Social History Narrative   Lives with husband   Social Determinants of Health   Financial Resource Strain: Low Risk  (03/15/2023)   Overall Financial Resource Strain (CARDIA)    Difficulty of Paying Living Expenses: Not hard at all  Food Insecurity: No Food Insecurity (03/15/2023)   Hunger Vital Sign    Worried About Running Out of Food in the Last Year: Never true    Ran Out of Food in the Last Year: Never true  Transportation Needs: No Transportation Needs (03/15/2023)   PRAPARE - Administrator, Civil Service (Medical): No    Lack of Transportation (Non-Medical): No  Physical Activity: Inactive (03/15/2023)   Exercise Vital Sign    Days of Exercise per Week: 0 days    Minutes of Exercise per Session: 0 min  Stress: Stress Concern Present (03/15/2023)   [employer] of Occupational Health - Occupational Stress Questionnaire    Feeling of Stress : To some extent  Social Connections: Socially Integrated (03/15/2023)   Social Connection and Isolation Panel [NHANES]    Frequency of Communication with Friends and Family: Twice a week    Frequency of Social Gatherings with Friends and Family: Once a week    Attends Religious Services: More than 4 times per year    Active Member of [employer]  or Organizations: No    Attends Banker Meetings: More than 4 times per  year    Marital Status: Married  Catering manager Violence: Not At Risk (08/13/2022)   Humiliation, Afraid, Rape, and Kick questionnaire    Fear of Current or Ex-Partner: No    Emotionally Abused: No    Physically Abused: No    Sexually Abused: No    Review of Systems  All other systems reviewed and are negative.      Objective:   Physical Exam Vitals reviewed.  Constitutional:      General: She is not in acute distress.    Appearance: Normal appearance. She is normal weight. She is not ill-appearing or toxic-appearing.  HENT:     Right Ear: Tympanic membrane and ear canal normal.     Left Ear: Tympanic membrane and ear canal normal.     Mouth/Throat:     Pharynx: Oropharynx is clear. No oropharyngeal exudate or posterior oropharyngeal erythema.  Cardiovascular:     Rate and Rhythm: Normal rate and regular rhythm.     Heart sounds: Normal heart sounds. No murmur heard.    No friction rub. No gallop.  Pulmonary:     Effort: Pulmonary effort is normal. No respiratory distress.     Breath sounds: Normal breath sounds. No wheezing, rhonchi or rales.  Abdominal:     General: Abdomen is flat. Bowel sounds are normal. There is no distension.     Palpations: Abdomen is soft.     Tenderness: There is no abdominal tenderness.  Musculoskeletal:     Right lower leg: No edema.     Left lower leg: No edema.  Neurological:     Mental Status: She is alert.          Assessment & Plan:  Dyspnea on exertion - Plan: Ambulatory referral to Pulmonology, CBC with Differential/Platelet I believe her dyspnea on exertion is most likely due to deconditioning.  I will check a CBC to rule out worsening anemia.  Cardiology has ruled out a cardiac source of her dyspnea on exertion.  I would like pulmonology to perform pulmonary function test to rule any obstructive respiratory component.  Pulmonary function test normal, I believe patient has been on exertion most likely due to deconditioning.  I  recommended continued weight loss.  I would like to increase her Ozempic.  She is currently taking 0.25 mg subcu weekly.  Recommended increasing to 0.5 mg subcu weekly and continue to uptitrate as tolerated

## 2023-03-16 NOTE — Telephone Encounter (Signed)
Requested medications are due for refill today.  Unsure  Requested medications are on the active medications list.  yes  Last refill. 8/24/20203 #30 1 rf  Future visit scheduled.   no  Notes to clinic.  Rx signed by Joycelyn Schmid. Medication not assigned to a protocol. Please review for refill.    Requested Prescriptions  Pending Prescriptions Disp Refills   INGREZZA 80 MG capsule [Pharmacy Med Name: Ingrezza 80 mg capsule 80] 30 capsule 5    Sig: Take one capsule by mouth daily     Off-Protocol Failed - 03/15/2023 10:33 AM      Failed - Medication not assigned to a protocol, review manually.      Failed - Valid encounter within last 12 months    Recent Outpatient Visits           1 year ago Hyponatremia   [facility] Pickard, [name], MD   1 year ago Bronchitis   [facility] Pickard, [name], MD   1 year ago Bronchitis   [facility] Tanya Nones, [name], MD   1 year ago Anemia, unspecified type   [facility] Tanya Nones [name], MD   1 year ago Acute non-recurrent pansinusitis   [facility] Valentino Nose, NP       Future Appointments             In 3 weeks Rachel Bo [facility]   In 1 month Nahser, Deloris Ping, MD [facility] at [facility]

## 2023-03-16 NOTE — Addendum Note (Signed)
Addended by: Venia Carbon K on: 03/16/2023 02:36 PM   Modules accepted: Orders

## 2023-03-22 ENCOUNTER — Telehealth: Payer: Self-pay

## 2023-03-22 NOTE — Telephone Encounter (Signed)
Pt called in to make pcp aware that she tolerated the additional shot of this med Semeglutide. Pt would like to know if pcp would send in a new prescription of 1 mg of this med please.    LOV: 03/16/23  PHARMACY: [facility]   CB#: [phone]

## 2023-03-23 ENCOUNTER — Other Ambulatory Visit: Payer: Self-pay

## 2023-03-23 NOTE — Telephone Encounter (Signed)
Prescription Request  03/23/2023  LOV: 03/16/23  What is the name of the medication or equipment? dexlansoprazole (DEXILANT) 60 MG capsule [[id number]]  Have you contacted your pharmacy to request a refill? Yes   Which pharmacy would you like this sent to?  [id number] [place], [place] - [address] AT [address] [place] [address] Phone: [phone] Fax: [phone]    Patient notified that their request is being sent to the clinical staff for review and that they should receive a response within 2 business days.   Please advise at [facility] [phone]

## 2023-03-24 ENCOUNTER — Other Ambulatory Visit: Payer: Self-pay

## 2023-03-24 ENCOUNTER — Telehealth: Payer: Self-pay

## 2023-03-24 DIAGNOSIS — G2401 Drug induced subacute dyskinesia: Secondary | ICD-10-CM

## 2023-03-24 MED ORDER — DEXLANSOPRAZOLE 60 MG PO CPDR: 60.0000 mg | DELAYED_RELEASE_CAPSULE | Freq: Every day | ORAL | 0 refills | Status: DC

## 2023-03-24 MED ORDER — VALBENAZINE TOSYLATE 80 MG PO CAPS
80.0000 mg | ORAL_CAPSULE | Freq: Every day | ORAL | 3 refills | Status: DC
Start: 2023-03-24 — End: 2023-04-07

## 2023-03-24 NOTE — Telephone Encounter (Signed)
[facility] called in to request a new prescription for this med valbenazine [facility]) 80 MG capsule [[id number]].  LOV:  12/31/22  PHARMACY: [facility] - [place], [place] - [address], [address], [place] [address] Phone: [phone]  Fax: [phone]

## 2023-03-24 NOTE — Telephone Encounter (Signed)
Requested Prescriptions  Pending Prescriptions Disp Refills   dexlansoprazole (DEXILANT) 60 MG capsule 90 capsule 0    Sig: Take 1 capsule (60 mg total) by mouth daily.     Gastroenterology: Proton Pump Inhibitors Failed - 03/23/2023  4:42 PM      Failed - Valid encounter within last 12 months    Recent Outpatient Visits           1 year ago Hyponatremia   [facility] Pickard, [name], MD   1 year ago Bronchitis   [facility] Tanya Nones, [name], MD   1 year ago Bronchitis   [facility] Tanya Nones, [name], MD   1 year ago Anemia, unspecified type   [facility] Donita Brooks, MD   1 year ago Acute non-recurrent pansinusitis   [facility] Valentino Nose, NP       Future Appointments             In 2 weeks [name] [facility]   In 1 month Nahser, Deloris Ping, MD [facility] at [facility]

## 2023-03-29 ENCOUNTER — Other Ambulatory Visit: Payer: Self-pay | Admitting: Rheumatology

## 2023-03-29 NOTE — Telephone Encounter (Signed)
Last Fill: 12/22/2022  Labs: 03/16/2023 CBC WNL, 02/12/2023 BUN 7, BUN/Creat. Ratio 8  Next Visit: 04/12/2023  Last Visit: 11/10/2022  DX: Psoriatic arthritis   Current Dose per office note 11/10/2022: Arava 20 mg 1 tablet by mouth daily   Okay to refill Arava ?

## 2023-03-29 NOTE — Progress Notes (Signed)
Office Visit Note  Patient: Janet Peterson             Date of Birth: 12-08-1950           MRN: [id number]             PCP: Donita Brooks, MD Referring: Donita Brooks, MD Visit Date: 04/12/2023 Occupation: @GUAROCC @  Subjective:  Thoracic spinal pain   History of Present Illness: Janet Peterson is a 72 y.o. female with history of psoriatic arthritis and DDD.  Patient remains on Arava 20 mg 1 tablet by mouth daily.  She is tolerating Arava without any side effects and has not had any interruptions in therapy.  She denies any flares of psoriasis recently.  Patient states that she has been experiencing increased discomfort in the thoracic spine.  She is aware that she has scoliosis as well as degenerative disc disease.  In the past the patient has tried physical therapy x 4 as well as an epidural and spinal ablation.  She is also been taking oxycodone for pain relief.  Both her back pain and right knee joint pain have been interfering with her quality of life.  Patient states that 3 to 4 weeks ago she twisted her right knee and has since then had increased discomfort.  She was evaluated at [facility] and had injection in her right knee performed about 1-1/2 weeks ago.  Patient would like to reapply for viscosupplementation for the right knee as well since her symptoms have not resolved.  She states that her left knee replacement is doing well.  She denies any joint swelling at this time.  She denies any Achilles tendinitis or plantar fasciitis.  She denies any SI joint pain.     Activities of Daily Living:  Patient reports morning stiffness for 1 hour   Patient Reports nocturnal pain.  Difficulty dressing/grooming: Denies Difficulty climbing stairs: Reports Difficulty getting out of chair: Reports Difficulty using hands for taps, buttons, cutlery, and/or writing: Denies  Review of Systems  Constitutional:  Positive for fatigue.  HENT:  Negative for mouth sores, mouth dryness and  nose dryness.   Eyes:  Positive for dryness. Negative for pain and visual disturbance.  Respiratory:  Negative for cough, hemoptysis, shortness of breath and difficulty breathing.   Cardiovascular:  Negative for chest pain, palpitations, hypertension and swelling in legs/feet.  Gastrointestinal:  Negative for blood in stool, constipation and diarrhea.  Endocrine: Negative for increased urination.  Genitourinary:  Negative for painful urination.  Musculoskeletal:  Positive for joint pain, joint pain and morning stiffness. Negative for joint swelling, myalgias, muscle weakness, muscle tenderness and myalgias.  Skin:  Negative for color change, pallor, rash, hair loss, nodules/bumps, skin tightness, ulcers and sensitivity to sunlight.  Allergic/Immunologic: Negative for susceptible to infections.  Neurological:  Negative for dizziness, numbness, headaches and weakness.  Hematological:  Negative for swollen glands.  Psychiatric/Behavioral:  Positive for depressed mood and sleep disturbance. The patient is nervous/anxious.     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 difficile colitis  09/29/2019   Colitis 09/28/2019   Anxiety and depression 09/28/2019   DDD (degenerative disc disease), cervical s/p fusion 11/12/2016   H/O total knee replacement, left 05/06/2016   DDD lumbar spine status post fusion 05/06/2016   Psoriasis 05/05/2016   High risk medication use 05/05/2016   Cervical post-laminectomy syndrome 12/09/2015   Thoracic postlaminectomy  syndrome 12/09/2015   Psoriatic arthritis (HCC) 08/23/2012   Osteopenia 08/23/2012   HLD (hyperlipidemia) 08/23/2012   RLS (restless legs syndrome) 08/23/2012   Insomnia 08/23/2012   Fibromyalgia syndrome 08/12/2012   Low back pain 08/12/2012   Syncope    PVC (premature ventricular contraction)    OCD (obsessive compulsive disorder)    GERD (gastroesophageal reflux disease)    Hypertension     Past Medical History:  Diagnosis Date   Ankylosing spondylitis (HCC)    Anxiety and depression    Depression    Fibromyalgia    GERD (gastroesophageal reflux disease)    Hiatal hernia    per patient, dx by GI    History of basal cell carcinoma excision    FACE, 1992  &  1996   History of hiatal hernia    History of thrush    Hyperlipidemia    Hypertension    Insomnia    Left breast mass    OCD (obsessive compulsive disorder)    Osteopenia    PONV (postoperative nausea and vomiting)    Psoriatic arthritis (HCC)    PVC (premature ventricular contraction)    Rheumatoid arthritis (HCC)     Family History  Problem Relation Age of Onset   Diabetes Mother    Heart disease Mother    Diabetes Father    Anuerysm Father    Diabetes Brother    Heart disease Brother    Diabetes Brother    Heart disease Brother    Breast cancer Neg Hx    Past Surgical History:  Procedure Laterality Date   BIOPSY  09/29/2019   Procedure: BIOPSY;  Surgeon: Kerin Salen, MD;  Location: [facility];  Service: Gastroenterology;;   BREAST EXCISIONAL BIOPSY Right    BREAST EXCISIONAL BIOPSY Left 05/04/2018   BREAST LUMPECTOMY WITH RADIOACTIVE SEED LOCALIZATION Left 05/04/2018   Procedure: LEFT BREAST LUMPECTOMY WITH RADIOACTIVE SEED LOCALIZATION AND LEFT BREAST NIPPLE BIOPSY;  Surgeon: Griselda Miner, MD;  Location: [facility];  Service: General;  Laterality: Left;   BREAST SURGERY  05/25/1973   lumpectomy-- benign   BUNIONECTOMY  05/25/1989   CATARACT EXTRACTION W/ INTRAOCULAR LENS   IMPLANT, BILATERAL  05/25/1998   CERVICAL FUSION  07/24/2011   C5 -- C7   CESAREAN SECTION  05/26/1983   COLONOSCOPY WITH PROPOFOL N/A 09/29/2019   Procedure: COLONOSCOPY WITH PROPOFOL;  Surgeon: Kerin Salen, MD;  Location: [facility];  Service: Gastroenterology;  Laterality: N/A;   DISTAL INTERPHALANGEAL JOINT FUSION Right 03/14/2015   Procedure: RIGHT LONG FINGER DISTAL INTERPHALANGEAL JOINT ARTHRODESIS;  Surgeon: Bradly Bienenstock, MD;  Location: [facility];  Service: Orthopedics;  Laterality: Right;   ESOPHAGOGASTRODUODENOSCOPY (EGD) WITH PROPOFOL N/A 09/29/2019   Procedure: ESOPHAGOGASTRODUODENOSCOPY (EGD) WITH PROPOFOL;  Surgeon: Kerin Salen, MD;  Location: [facility];  Service: Gastroenterology;  Laterality: N/A;   EYE SURGERY  05/25/1993   rk (laser surgery), semi cornea transplant, detacted retina,  fluid removal   HYSTEROSCOPY WITH D & C N/A 12/09/2020   Procedure: DILATATION AND CURETTAGE /HYSTEROSCOPY;  Surgeon: Steva Ready, DO;  Location: [facility];  Service: Gynecology;  Laterality:  N/A;   KNEE ARTHROSCOPY Left 03/03/2004   POLYPECTOMY  09/29/2019   Procedure: POLYPECTOMY;  Surgeon: Kerin Salen, MD;  Location: [facility];  Service: Gastroenterology;;   POSTERIOR VITRECTOMY RIGHT EYE AND LASER   10/27/1999   SHOULDER SURGERY Right 05/26/1995   SKIN BIOPSY  05/2022   lower stomach   SPINAL FIXATION SURGERY W/ IMPLANT  2013 rod #1//  2014  rod 2   S1 -- T10  (rod #1)//   S1 -- T4 (rod #2)   THORACIC FUSION  03/13/2013   REMOVAL HARDWARE/  BONE GRAFT FUSION T10//  REVISION OF RODS   TOTAL KNEE ARTHROPLASTY Left 12/14/2005   UVULOPALATOPHARYNGOPLASTY  04/26/2006   w/  TONSILLECTOMY/  TURBINATE REDUCTIONS/  BILATERAL ANTERIOR ETHMOIDECTOMY   Social History   Social History Narrative   Lives with husband   Immunization History  Administered Date(s) Administered   Fluad Quad(high Dose 65+) 01/26/2019, 03/05/2020, 03/18/2022   Fluad  Trivalent(High Dose 65+) 03/16/2023   Influenza Whole 03/04/2012   Influenza, High Dose Seasonal PF 03/25/2018   Influenza,inj,Quad PF,6+ Mos 02/28/2013, 03/07/2014, 02/28/2015, 03/27/2016, 03/09/2017   Influenza-Unspecified 04/08/2021   PFIZER(Purple Top)SARS-COV-2 Vaccination 08/10/2019   PNEUMOCOCCAL CONJUGATE-20 10/27/2022   Pneumococcal Conjugate-13 09/10/2016   Pneumococcal Polysaccharide-23 03/12/2006, 11/09/2011   Pneumococcal-Unspecified 08/31/2016   Unspecified SARS-COV-2 Vaccination 08/07/2019, 08/28/2019, 03/08/2020   Zoster Recombinant(Shingrix) 08/12/2021, 11/21/2021     Objective: Vital Signs: BP 113/82 (BP Location: Left Arm, Patient Position: Sitting, Cuff Size: Normal)   Pulse 91   Ht 5\' 3"  (1.6 m)   Wt 192 lb (87.1 kg)   BMI 34.01 kg/m    Physical Exam Vitals and nursing note reviewed.  Constitutional:      Appearance: She is well-developed.  HENT:     Head: Normocephalic and atraumatic.  Eyes:     Conjunctiva/sclera: Conjunctivae normal.  Cardiovascular:     Rate and Rhythm: Normal rate and regular rhythm.     Heart sounds: Normal heart sounds.  Pulmonary:     Effort: Pulmonary effort is normal.     Breath sounds: Normal breath sounds.  Abdominal:     General: Bowel sounds are normal.     Palpations: Abdomen is soft.  Musculoskeletal:     Cervical back: Normal range of motion.  Lymphadenopathy:     Cervical: No cervical adenopathy.  Skin:    General: Skin is warm and dry.     Capillary Refill: Capillary refill takes less than 2 seconds.  Neurological:     Mental Status: She is alert and oriented to person, place, and time.  Psychiatric:        Behavior: Behavior normal.      Musculoskeletal Exam: C-spine has limited range of motion with lateral rotation.  Trapezius muscle tension and tenderness bilaterally.  Thoracic kyphosis noted.  Midline spinal tenderness in the thoracic region.  No SI joint tenderness upon palpation.  Limited mobility of  the lumbar spine.  Shoulder joints, elbow joints, wrist joints, MCPs, PIPs, DIPs have good range of motion with no synovitis.  PIP and DIP thickening noted.  Hip joints have slightly limited range of motion.  Left knee replacement has good range of motion.  Right knee joint has discomfort with range of motion but no effusion noted.  Ankle joints have good range of motion with no tenderness or joint swelling.  No evidence of Achilles tendinitis or plantar fasciitis.  CDAI Exam: CDAI Score: -- Patient Global: --; Provider Global: -- Swollen: --; Tender: --  Joint Exam 04/12/2023   No joint exam has been documented for this visit   There is currently no information documented on the homunculus. Go to the Rheumatology activity and complete the homunculus joint exam.  Investigation: No additional findings.  Imaging: No results found.  Recent Labs: Lab Results  Component Value Date   WBC 6.2 03/16/2023   HGB 11.8 03/16/2023   PLT 270 03/16/2023   NA 138 02/12/2023   K 4.4 02/12/2023   CL 99 02/12/2023   CO2 22 02/12/2023   GLUCOSE 88 02/12/2023   BUN 7 (L) 02/12/2023   CREATININE 0.91 02/12/2023   BILITOT 0.2 01/20/2023   ALKPHOS 116 01/20/2023   AST 10 01/20/2023   ALT 5 01/20/2023   PROT 6.0 01/20/2023   ALBUMIN 3.7 (L) 01/20/2023   CALCIUM 9.9 02/12/2023   GFRAA 82 09/26/2020   QFTBGOLD Indeterminate 08/14/2016   QFTBGOLDPLUS Negative 12/22/2018    Speciality Comments: Simponi Aria every 8 weeks started Jan 2018  TB gold negative 08/19/16  Procedures:  No procedures performed Allergies: Latex, Amlodipine besy-benazepril hcl, Amoxicillin-pot clavulanate, Bextra [valdecoxib], Chlorhexidine, Hydrochlorothiazide, Lipitor [atorvastatin calcium], Spironolactone, Sulfa antibiotics, and Zocor [simvastatin]   Assessment / Plan:     Visit Diagnoses: Psoriatic arthritis (HCC) - No synovitis or dactylitis noted on examination today.  No recent flares of psoriasis.  No evidence of  Achilles tendinitis or plantar fasciitis.  No SI joint tenderness upon palpation today.  Patient remains on Arava 20 mg 1 tablet by mouth daily.  She is tolerating Arava without any side effects or interruptions in therapy.  Patient presents today with increased discomfort in her thoracic spine as well as an aching sensation in her right knee.  She has been taking Percocet as needed for pain relief but continues to have breakthrough symptoms.  Patient had a recent cortisone injection performed in the right knee 1-1/2 weeks ago at [facility].  No knee joint effusion was noted on examination today.  Plan to reapply for viscosupplementation for the right knee.  Patient was encouraged to follow back up with her spine specialist in regards to the thoracic pain she has been experiencing.  She has tried physical therapy, and epidural injection, and spinal ablation in the past.   Overall her psoriatic arthritis appears to be well-controlled taking Arava 20 mg 1 tablet by mouth daily.  Plan to check sed rate today.  She will notify us if she develops any new or worsening symptoms.  She will follow-up in the office in 5 months or sooner if needed.  Plan: Sedimentation rate  Psoriasis: No recent flares of psoriasis.    High risk medication use - Arava 20 mg 1 tablet by mouth daily.  Previously was on Simponi Aria IV infusion 2 mg/kg every 8 weeks-patient discontinued in March 2021 due to C. difficile.  CBC updated on 03/16/23. BMP updated on 02/12/23. CMET updated on 01/20/23.   Plan to update CBC and CMP today. No recent or recurrent infections.  Discussed the importance of holding arava if she develops signs or symptoms of an infection and to resume once the infection has completely cleared. - Plan: CBC with Differential/Platelet, COMPLETE METABOLIC PANEL WITH GFR  Primary osteoarthritis of both hands: She has PIP and DIP thickening consistent with osteoarthritis of both hands.  No synovitis or dactylitis  noted on examination today.  Primary osteoarthritis of right knee - Visco right knee June/July 2023.  Patient recently twisted her right knee 3 to 4 weeks  ago and has had increased discomfort since then.  She was evaluated at [facility] and was told that she is having exacerbation of her arthritis and bursitis.  She was given a cortisone injection at that time which has subsided her discomfort in the bursa but she has continued to have aching in her right knee.  She would like to reapply for viscosupplementation for the right knee which has been helpful at alleviating her symptoms in the past. No effusion noted on examination today.  This patient is diagnosed with osteoarthritis of the knee(s).    Radiographs show evidence of joint space narrowing, osteophytes, subchondral sclerosis and/or subchondral cysts.  This patient has knee pain which interferes with functional and activities of daily living.    This patient has experienced inadequate response, adverse effects and/or intolerance with conservative treatments such as acetaminophen, NSAIDS, topical creams, physical therapy or regular exercise, knee bracing and/or weight loss.   This patient has experienced inadequate response or has a contraindication to intra articular steroid injections for at least 3 months.   This patient is not scheduled to have a total knee replacement within 6 months of starting treatment with viscosupplementation.  H/O total knee replacement, left: Doing well.  No effusion noted.   DDD (degenerative disc disease), cervical s/p fusion: Limited ROM with lateral rotation.   DDD (degenerative disc disease), thoracic: Chronic pain.  Patient has tried physical therapy, spinal ablation, and an epidural injection in the past.  She continues to have persistent discomfort.  She has been taking Percocet as needed for pain relief but her symptoms have not been adequately controlled.  Offered to place a new referral for  physical therapy but she has declined at this time.  Patient was encouraged to follow back up with her spine specialist if her symptoms persist or worsen.  DDD lumbar spine status post fusion: Chronic pain.  Fibromyalgia: Patient presents today experiencing myofascial pain consistent with fibromyalgia.  She is having increased trapezius muscle tension and tenderness bilaterally.  She takes Flexeril 10 mg 3 times daily as needed for muscle spasms and has a prescription for oxycodone to take as needed for pain relief.  Other fatigue: Chronic, stable.  Secondary to insomnia.  Primary insomnia: Patient experiences intermittent nocturnal pain contributing to interrupted sleep at night.  Osteopenia of multiple sites - DEXA scan from April 02 2021 DEXA showed T score -1.3.  BMD 0.857 in the left femoral neck. Due to update DEXA November 2024.  [facility] has [facility] by her PCP in the past.  Other medical conditions are listed as follows:  History of gastroesophageal reflux (GERD)  History of hyperlipidemia  History of hypertension  History of OCD (obsessive compulsive disorder)  RLS (restless legs syndrome)  History of migraine  Orders: Orders Placed This Encounter  Procedures   CBC with Differential/Platelet   COMPLETE METABOLIC PANEL WITH GFR   Sedimentation rate   No orders of the defined types were placed in this encounter.     Follow-Up Instructions: Return in 5 months (on 09/10/2023) for Psoriatic arthritis, DDD.   Gearldine Bienenstock, PA-C  Note - This record has been created using Dragon software.  Chart creation errors have been sought, but may not always  have been located. Such creation errors do not reflect on  the standard of medical care.

## 2023-04-01 DIAGNOSIS — M25561 Pain in right knee: Secondary | ICD-10-CM | POA: Insufficient documentation

## 2023-04-02 ENCOUNTER — Telehealth: Payer: Self-pay

## 2023-04-02 NOTE — Telephone Encounter (Signed)
Copied from CRM [id number]. Topic: Clinical - Medication Question >> Apr 02, 2023  3:52 PM Larwance Sachs wrote: Reason for CRM: Patient request Janet Peterson reach out to her at [phone] regarding medication change to confirm if they have been made before she runs out of medication on hand  Placed order in Dr Felisa Bonier green folder.

## 2023-04-06 ENCOUNTER — Other Ambulatory Visit: Payer: Self-pay | Admitting: Family Medicine

## 2023-04-06 NOTE — Telephone Encounter (Signed)
Copied from CRM [id number]. Topic: Clinical - Medication Refill >> Apr 06, 2023  2:43 PM Deaijah H wrote: Most Recent Primary Care Visit:  Provider: Lynnea Ferrier T  Department: [facility]  Visit Type: OFFICE VISIT  Date: 03/16/2023  Medication: ***  Has the patient contacted their pharmacy?  (Agent: If no, request that the patient contact the pharmacy for the refill. If patient does not wish to contact the pharmacy document the reason why and proceed with request.) (Agent: If yes, when and what did the pharmacy advise?)  Is this the correct pharmacy for this prescription?  If no, delete pharmacy and type the correct one.  This is the patient's preferred pharmacy:  [id number] [place], [place] - [address] [place] [address] Phone: [phone] Fax: [phone]  [facility] [place], [place] - [address], [place] [address], [place] [address] [place] [address] Phone: [phone] Fax: [phone]   Has the prescription been filled recently?   Is the patient out of the medication?   Has the patient been seen for an appointment in the last year OR does the patient have an upcoming appointment?   Can we respond through MyChart?   Agent: Please be advised that Rx refills may take up to 3 business days. We ask that you follow-up with your pharmacy.

## 2023-04-07 ENCOUNTER — Ambulatory Visit: Payer: Medicare Other | Admitting: Family Medicine

## 2023-04-07 ENCOUNTER — Encounter: Payer: Self-pay | Admitting: Family Medicine

## 2023-04-07 VITALS — BP 140/100 | HR 86 | Temp 98.1°F | Ht 63.0 in | Wt 194.0 lb

## 2023-04-07 DIAGNOSIS — N3000 Acute cystitis without hematuria: Secondary | ICD-10-CM | POA: Diagnosis not present

## 2023-04-07 MED ORDER — CIPROFLOXACIN HCL 500 MG PO TABS: 500.0000 mg | ORAL_TABLET | Freq: Two times a day (BID) | ORAL | 0 refills | Status: AC

## 2023-04-07 NOTE — Progress Notes (Signed)
Subjective:  HPI: Janet Peterson is a 72 y.o. female presenting on 04/07/2023 for Acute Visit (Bladder infection)   HPI Patient is in today for slow urine flow, urgency, hesitancy, frequency, cloudy dark and malodorous urine with body aches and "feeling bad" since last week. Denies abdominal or back pain, fever, chills, dysuria, hematuria. Symptoms are unchanged over course of week.  Unable to collect sample today. She will bring sample by tomorrow.   Review of Systems  All other systems reviewed and are negative.   Relevant past medical history reviewed and updated as indicated.   Past Medical History:  Diagnosis Date   Ankylosing spondylitis (HCC)    Anxiety and depression    Depression    Fibromyalgia    GERD (gastroesophageal reflux disease)    Hiatal hernia    per patient, dx by GI    History of basal cell carcinoma excision    FACE, 1992  &  1996   History of hiatal hernia    History of thrush    Hyperlipidemia    Hypertension    Insomnia    Left breast mass    OCD (obsessive compulsive disorder)    Osteopenia    PONV (postoperative nausea and vomiting)    Psoriatic arthritis (HCC)    PVC (premature ventricular contraction)    Rheumatoid arthritis (HCC)      Past Surgical History:  Procedure Laterality Date   BIOPSY  09/29/2019   Procedure: BIOPSY;  Surgeon: Kerin Salen, MD;  Location: [facility];  Service: Gastroenterology;;   BREAST EXCISIONAL BIOPSY Right    BREAST EXCISIONAL BIOPSY Left 05/04/2018   BREAST LUMPECTOMY WITH RADIOACTIVE SEED LOCALIZATION Left 05/04/2018   Procedure: LEFT BREAST LUMPECTOMY WITH RADIOACTIVE SEED LOCALIZATION AND LEFT BREAST NIPPLE BIOPSY;  Surgeon: Griselda Miner, MD;  Location: [facility];  Service: General;  Laterality: Left;   BREAST SURGERY  05/25/1973   lumpectomy-- benign   BUNIONECTOMY  05/25/1989   CATARACT EXTRACTION W/ INTRAOCULAR LENS  IMPLANT, BILATERAL  05/25/1998   CERVICAL FUSION  07/24/2011    C5 -- C7   CESAREAN SECTION  05/26/1983   COLONOSCOPY WITH PROPOFOL N/A 09/29/2019   Procedure: COLONOSCOPY WITH PROPOFOL;  Surgeon: Kerin Salen, MD;  Location: [facility];  Service: Gastroenterology;  Laterality: N/A;   DISTAL INTERPHALANGEAL JOINT FUSION Right 03/14/2015   Procedure: RIGHT LONG FINGER DISTAL INTERPHALANGEAL JOINT ARTHRODESIS;  Surgeon: Bradly Bienenstock, MD;  Location: [facility] ;  Service: Orthopedics;  Laterality: Right;   ESOPHAGOGASTRODUODENOSCOPY (EGD) WITH PROPOFOL N/A 09/29/2019   Procedure: ESOPHAGOGASTRODUODENOSCOPY (EGD) WITH PROPOFOL;  Surgeon: Kerin Salen, MD;  Location: [facility];  Service: Gastroenterology;  Laterality: N/A;   EYE SURGERY  05/25/1993   rk (laser surgery), semi cornea transplant, detacted retina,  fluid removal   HYSTEROSCOPY WITH D & C N/A 12/09/2020   Procedure: DILATATION AND CURETTAGE /HYSTEROSCOPY;  Surgeon: Steva Ready, DO;  Location: [facility];  Service: Gynecology;  Laterality: N/A;   KNEE ARTHROSCOPY Left 03/03/2004   POLYPECTOMY  09/29/2019   Procedure: POLYPECTOMY;  Surgeon: Kerin Salen, MD;  [facility]: [facility];  Service: Gastroenterology;;   POSTERIOR VITRECTOMY RIGHT EYE AND LASER   10/27/1999   SHOULDER SURGERY Right 05/26/1995   SKIN BIOPSY  05/2022   lower stomach   SPINAL FIXATION SURGERY W/ IMPLANT  2013 rod #1//  2014  rod 2   S1 -- T10  (rod #1)//   S1 -- T4 (rod #2)  THORACIC FUSION  03/13/2013   REMOVAL HARDWARE/  BONE GRAFT FUSION T10//  REVISION OF RODS   TOTAL KNEE ARTHROPLASTY Left 12/14/2005   UVULOPALATOPHARYNGOPLASTY  04/26/2006   w/  TONSILLECTOMY/  TURBINATE REDUCTIONS/  BILATERAL ANTERIOR ETHMOIDECTOMY    Allergies and medications reviewed and updated.   Current Outpatient Medications:    albuterol (VENTOLIN HFA) 108 (90 Base) MCG/ACT inhaler, TAKE 2 PUFFS BY MOUTH EVERY 6 HOURS AS NEEDED FOR WHEEZE OR SHORTNESS OF BREATH, Disp: 8.5 each, Rfl: 1   Alirocumab  (PRALUENT) 75 MG/ML SOAJ, Inject 1 mL (75 mg total) into the skin every 14 (fourteen) days., Disp: 2 mL, Rfl: 11   amLODipine (NORVASC) 5 MG tablet, Take 1 tablet (5 mg total) by mouth daily., Disp: 90 tablet, Rfl: 3   buPROPion (WELLBUTRIN XL) 300 MG 24 hr tablet, Take 300 mg by mouth every morning., Disp: , Rfl: 1   ciprofloxacin (CIPRO) 500 MG tablet, Take 1 tablet (500 mg total) by mouth 2 (two) times daily for 5 days., Disp: 10 tablet, Rfl: 0   clonazePAM (KLONOPIN) 1 MG tablet, TAKE 1 TABLET BY MOUTH EVERY DAY AS NEEDED FOR ANXIETY (Patient taking differently: Take 1 mg by mouth daily as needed for anxiety.), Disp: 30 tablet, Rfl: 1   cyclobenzaprine (FLEXERIL) 10 MG tablet, Take 1 tablet (10 mg total) by mouth 3 (three) times daily as needed for muscle spasms., Disp: 30 tablet, Rfl: 0   dexlansoprazole (DEXILANT) 60 MG capsule, Take 1 capsule (60 mg total) by mouth daily., Disp: 90 capsule, Rfl: 0   doxepin (SINEQUAN) 50 MG capsule, Take 50 mg by mouth., Disp: , Rfl:    ezetimibe (ZETIA) 10 MG tablet, TAKE 1 TABLET BY MOUTH EVERY DAY, Disp: 90 tablet, Rfl: 2   finasteride (PROSCAR) 5 MG tablet, Take 1 tablet (5 mg total) by mouth daily., Disp: 90 tablet, Rfl: 1   FLUoxetine (PROZAC) 20 MG capsule, Take 20 mg by mouth 3 (three) times daily., Disp: , Rfl:    fluticasone (FLONASE) 50 MCG/ACT nasal spray, Place 2 sprays into both nostrils daily as needed for allergies., Disp: 16 g, Rfl: 11   hydrocortisone cream 1 %, Apply 1 Application topically 2 (two) times daily., Disp: 30 g, Rfl: 0   INGREZZA 80 MG capsule, Take one capsule by mouth daily, Disp: 30 capsule, Rfl: 1   leflunomide (ARAVA) 20 MG tablet, TAKE 1 TABLET BY MOUTH EVERY DAY, Disp: 90 tablet, Rfl: 0   linaclotide (LINZESS) 72 MCG capsule, Take 1 capsule (72 mcg total) by mouth daily before breakfast. (Patient taking differently: Take 72 mcg by mouth as needed.), Disp: 90 capsule, Rfl: 3   metoprolol succinate (TOPROL-XL) 50 MG 24 hr  tablet, TAKE 1 TABLET BY MOUTH DAILY WITH OR IMMEDIATELY FOLLOWING A MEAL., Disp: 90 tablet, Rfl: 0   metoprolol tartrate (LOPRESSOR) 100 MG tablet, Take 1 tablet by mouth two hours prior to scan, Disp: 1 tablet, Rfl: 0   ondansetron (ZOFRAN) 4 MG tablet, Take 1 tablet (4 mg total) by mouth 4 (four) times daily as needed for nausea., Disp: 20 tablet, Rfl: 1   oxyCODONE-acetaminophen (PERCOCET) 5-325 MG tablet, Take 1 tablet by mouth every 8 (eight) hours as needed for severe pain. Ok to fill after 01/01/23, Disp: 60 tablet, Rfl: 0   valsartan (DIOVAN) 320 MG tablet, TAKE 1 TABLET BY MOUTH EVERY DAY, Disp: 90 tablet, Rfl: 3  Allergies  Allergen Reactions   Latex Rash    And Mouth sores  Other reaction(s): rash Other reaction(s): rash Other reaction(s): rash   Amlodipine Besy-Benazepril Hcl Other (See Comments)    COUGH   Amoxicillin-Pot Clavulanate Diarrhea and Nausea And Vomiting    Other reaction(s): sick on stomach Other reaction(s): sick on stomach   Bextra [Valdecoxib] Diarrhea and Nausea And Vomiting    REFLUX   Chlorhexidine     rash Other reaction(s): rash all over body Other reaction(s): rash all over body   Hydrochlorothiazide     Low sodium   Lipitor [Atorvastatin Calcium] Other (See Comments)    MYALGIAS   Spironolactone     hyponatremia   Sulfa Antibiotics Nausea And Vomiting    CRAMPING Other reaction(s): stomach hurts Other reaction(s): stomach hurts   Zocor [Simvastatin] Other (See Comments)    MYALGIA    Objective:   BP (!) 140/100   Pulse 86   Temp 98.1 F (36.7 C) (Oral)   Ht 5\' 3"  (1.6 m)   Wt 194 lb (88 kg)   SpO2 93%   BMI 34.37 kg/m      04/07/2023    4:18 PM 04/07/2023    4:08 PM 03/16/2023   11:58 AM  Vitals with BMI  Height  5\' 3"  5\' 3"   Weight  194 lbs 194 lbs  BMI  34.37 34.37  Systolic 140 150 161  Diastolic 100 100 72  Pulse  86 91     Physical Exam Vitals and nursing note reviewed.  Constitutional:      Appearance: Normal  appearance. She is normal weight.  HENT:     Head: Normocephalic and atraumatic.  Abdominal:     Tenderness: There is right CVA tenderness.  Skin:    General: Skin is warm and dry.  Neurological:     General: No focal deficit present.     Mental Status: She is alert and oriented to person, place, and time. Mental status is at baseline.  Psychiatric:        Mood and Affect: Mood normal.        Behavior: Behavior normal.        Thought Content: Thought content normal.        Judgment: Judgment normal.     Assessment & Plan:  Acute cystitis without hematuria Assessment & Plan: Unable to collect specimen in office. Will treat with Cipro due to allergies 500mg  BID for 5 days. She will try to bring urine sample tomorrow for confirmation and culture.Return to office if sytmpoms persist or worsen.   Other orders -     Ciprofloxacin HCl; Take 1 tablet (500 mg total) by mouth 2 (two) times daily for 5 days.  Dispense: 10 tablet; Refill: 0     Follow up plan: Return if symptoms worsen or fail to improve.  Park Meo, FNP

## 2023-04-07 NOTE — Assessment & Plan Note (Signed)
Unable to collect specimen in office. Will treat with Cipro due to allergies 500mg  BID for 5 days. She will try to bring urine sample tomorrow for confirmation and culture.Return to office if sytmpoms persist or worsen.

## 2023-04-08 ENCOUNTER — Other Ambulatory Visit: Payer: Self-pay

## 2023-04-08 DIAGNOSIS — N3000 Acute cystitis without hematuria: Secondary | ICD-10-CM

## 2023-04-09 ENCOUNTER — Other Ambulatory Visit: Payer: Self-pay | Admitting: Family Medicine

## 2023-04-09 ENCOUNTER — Telehealth: Payer: Self-pay

## 2023-04-09 LAB — URINALYSIS, ROUTINE W REFLEX MICROSCOPIC
Bacteria, UA: NONE SEEN /[HPF]
Bilirubin Urine: NEGATIVE
Glucose, UA: NEGATIVE
Hgb urine dipstick: NEGATIVE
Hyaline Cast: NONE SEEN /[LPF]
Ketones, ur: NEGATIVE
Nitrite: NEGATIVE
Protein, ur: NEGATIVE
RBC / HPF: NONE SEEN /[HPF] (ref 0–2)
Specific Gravity, Urine: 1.007 (ref 1.001–1.035)
Squamous Epithelial / HPF: NONE SEEN /[HPF] (ref <=5)
WBC, UA: NONE SEEN /[HPF] (ref 0–5)
pH: 7 (ref 5.0–8.0)

## 2023-04-09 LAB — URINE CULTURE
MICRO NUMBER:: 15731326
Result:: NO GROWTH
SPECIMEN QUALITY:: ADEQUATE

## 2023-04-09 MED ORDER — OXYCODONE-ACETAMINOPHEN 5-325 MG PO TABS: 1.0000 | ORAL_TABLET | Freq: Three times a day (TID) | ORAL | 0 refills | Status: DC | PRN

## 2023-04-09 NOTE — Telephone Encounter (Signed)
  Last fill 03/05/23. Thank you  Copied from CRM [id number]. Topic: Clinical - Medication Refill >> Apr 09, 2023 10:23 AM Annie Main wrote: Most Recent Primary Care Visit:  Provider: Kurtis Bushman S  Department: [facility]  Visit Type: OFFICE VISIT  Date: 04/07/2023  Medication: oxyCODONE-acetaminophen (PERCOCET) 5-325 MG tablet and  Has the patient contacted their pharmacy? Yes (Agent: If no, request that the patient contact the pharmacy for the refill. If patient does not wish to contact the pharmacy document the reason why and proceed with request.) (Agent: If yes, when and what did the pharmacy advise?)  Is this the correct pharmacy for this prescription? Yes If no, delete pharmacy and type the correct one.  This is the patient's preferred pharmacy:  [id number] [place], [place] - [address] [place] [address] Phone: [phone] Fax: [phone]  [facility] [place], [place] - [address], [place] [address] [place] [address] [place] [address] Phone: [phone] Fax: [phone]   Has the prescription been filled recently? Yes  Is the patient out of the medication? Yes  Has the patient been seen for an appointment in the last year OR does the patient have an upcoming appointment? Yes  Can we respond through MyChart? Yes  Agent: Please be advised that Rx refills may take up to 3 business days. We ask that you follow-up with your pharmacy.

## 2023-04-12 ENCOUNTER — Ambulatory Visit: Payer: Medicare Other | Attending: Physician Assistant | Admitting: Physician Assistant

## 2023-04-12 ENCOUNTER — Encounter: Payer: Self-pay | Admitting: Physician Assistant

## 2023-04-12 VITALS — BP 113/82 | HR 91 | Ht 63.0 in | Wt 192.0 lb

## 2023-04-12 DIAGNOSIS — M5134 Other intervertebral disc degeneration, thoracic region: Secondary | ICD-10-CM

## 2023-04-12 DIAGNOSIS — Z79899 Other long term (current) drug therapy: Secondary | ICD-10-CM | POA: Diagnosis not present

## 2023-04-12 DIAGNOSIS — M47816 Spondylosis without myelopathy or radiculopathy, lumbar region: Secondary | ICD-10-CM

## 2023-04-12 DIAGNOSIS — G2581 Restless legs syndrome: Secondary | ICD-10-CM

## 2023-04-12 DIAGNOSIS — M19041 Primary osteoarthritis, right hand: Secondary | ICD-10-CM

## 2023-04-12 DIAGNOSIS — L4050 Arthropathic psoriasis, unspecified: Secondary | ICD-10-CM

## 2023-04-12 DIAGNOSIS — Z8679 Personal history of other diseases of the circulatory system: Secondary | ICD-10-CM

## 2023-04-12 DIAGNOSIS — M8589 Other specified disorders of bone density and structure, multiple sites: Secondary | ICD-10-CM

## 2023-04-12 DIAGNOSIS — Z8639 Personal history of other endocrine, nutritional and metabolic disease: Secondary | ICD-10-CM

## 2023-04-12 DIAGNOSIS — Z8669 Personal history of other diseases of the nervous system and sense organs: Secondary | ICD-10-CM

## 2023-04-12 DIAGNOSIS — M797 Fibromyalgia: Secondary | ICD-10-CM

## 2023-04-12 DIAGNOSIS — Z8719 Personal history of other diseases of the digestive system: Secondary | ICD-10-CM

## 2023-04-12 DIAGNOSIS — F5101 Primary insomnia: Secondary | ICD-10-CM

## 2023-04-12 DIAGNOSIS — M1711 Unilateral primary osteoarthritis, right knee: Secondary | ICD-10-CM

## 2023-04-12 DIAGNOSIS — M19042 Primary osteoarthritis, left hand: Secondary | ICD-10-CM

## 2023-04-12 DIAGNOSIS — R5383 Other fatigue: Secondary | ICD-10-CM

## 2023-04-12 DIAGNOSIS — M5030 Other cervical disc degeneration, unspecified cervical region: Secondary | ICD-10-CM

## 2023-04-12 DIAGNOSIS — L409 Psoriasis, unspecified: Secondary | ICD-10-CM | POA: Diagnosis not present

## 2023-04-12 DIAGNOSIS — Z96652 Presence of left artificial knee joint: Secondary | ICD-10-CM

## 2023-04-12 DIAGNOSIS — Z8659 Personal history of other mental and behavioral disorders: Secondary | ICD-10-CM

## 2023-04-12 NOTE — Patient Instructions (Addendum)

## 2023-04-13 ENCOUNTER — Telehealth: Payer: Self-pay

## 2023-04-13 LAB — COMPLETE METABOLIC PANEL WITH GFR
AG Ratio: 1.5 (calc) (ref 1.0–2.5)
ALT: 4 U/L — ABNORMAL LOW (ref 6–29)
AST: 9 U/L — ABNORMAL LOW (ref 10–35)
Albumin: 4.1 g/dL (ref 3.6–5.1)
Alkaline phosphatase (APISO): 104 U/L (ref 37–153)
BUN: 15 mg/dL (ref 7–25)
CO2: 27 mmol/L (ref 20–32)
Calcium: 10.6 mg/dL — ABNORMAL HIGH (ref 8.6–10.4)
Chloride: 100 mmol/L (ref 98–110)
Creat: 0.99 mg/dL (ref 0.60–1.00)
Globulin: 2.7 g/dL (ref 1.9–3.7)
Glucose, Bld: 104 mg/dL — ABNORMAL HIGH (ref 65–99)
Potassium: 3.9 mmol/L (ref 3.5–5.3)
Sodium: 136 mmol/L (ref 135–146)
Total Bilirubin: 0.3 mg/dL (ref 0.2–1.2)
Total Protein: 6.8 g/dL (ref 6.1–8.1)
eGFR: 60 mL/min/{1.73_m2} (ref >=60)

## 2023-04-13 LAB — CBC WITH DIFFERENTIAL/PLATELET
Absolute Lymphocytes: 1428 {cells}/uL (ref 850–3900)
Absolute Monocytes: 671 {cells}/uL (ref 200–950)
Basophils Absolute: 60 {cells}/uL (ref 0–200)
Basophils Relative: 0.7 %
Eosinophils Absolute: 26 {cells}/uL (ref 15–500)
Eosinophils Relative: 0.3 %
HCT: 39.2 % (ref 35.0–45.0)
Hemoglobin: 12.7 g/dL (ref 11.7–15.5)
MCH: 31.1 pg (ref 27.0–33.0)
MCHC: 32.4 g/dL (ref 32.0–36.0)
MCV: 96.1 fL (ref 80.0–100.0)
MPV: 10.5 fL (ref 7.5–12.5)
Monocytes Relative: 7.8 %
Neutro Abs: 6416 {cells}/uL (ref 1500–7800)
Neutrophils Relative %: 74.6 %
Platelets: 313 10*3/uL (ref 140–400)
RBC: 4.08 10*6/uL (ref 3.80–5.10)
RDW: 12.8 % (ref 11.0–15.0)
Total Lymphocyte: 16.6 %
WBC: 8.6 10*3/uL (ref 3.8–10.8)

## 2023-04-13 LAB — SEDIMENTATION RATE: Sed Rate: 17 mm/h (ref 0–30)

## 2023-04-13 NOTE — Progress Notes (Signed)
CBC WNL. Calcium is borderline elevated-10.6. Please clarity if she has been taking a calcium supplement?  ESR Wnl

## 2023-04-13 NOTE — Telephone Encounter (Signed)
Please apply for right knee visco, per Taylor Dale, PA-C. Thanks!  

## 2023-04-14 NOTE — Telephone Encounter (Signed)
Patient is aware that we will be applying for her visco after 05/26/23.

## 2023-04-15 ENCOUNTER — Telehealth: Payer: Self-pay | Admitting: Family Medicine

## 2023-04-15 NOTE — Telephone Encounter (Signed)
Prescription Request  04/15/2023  LOV: 03/16/2023  What is the name of the medication or equipment?   SEMAGLUTIDE,0.25 OR 0.5MG /DOS, Pence  **Pharmacy needs new script for dose increase of 1 (up from .25 - .5) as discussed with provider** **Patient's spouse dropped off form for provider to complete and sign. Form placed on nurse's desk.**  Have you contacted your pharmacy to request a refill? Yes   Which pharmacy would you like this sent to?  [facility] [place], [place] - [address] [place] [address] Phone: [phone] Fax: [phone]    Patient notified that their request is being sent to the clinical staff for review and that they should receive a response within 2 business days.   Please advise pharmacy.

## 2023-04-19 ENCOUNTER — Telehealth: Payer: Self-pay | Admitting: *Deleted

## 2023-04-19 NOTE — Telephone Encounter (Signed)
Rinvoq is a high risk medication--significant risk for immunosuppression/recurrent infections and has a black box warning for major cardiovascular events-heart attack, stroke, and blood clots.  Rinvoq is reserved for severe cases of psoriatic arthritis.   She had no active inflammation at her last office visit.  If she is concerned about joint swelling we can schedule an ultrasound but it seemed that arava was controlling her psoriatic arthritis.  Rinvoq will not help with osteoarthritic pain.

## 2023-04-19 NOTE — Telephone Encounter (Signed)
Patient called wanting to know if Rinvoq would help her joints? Please advise. Thank you.

## 2023-04-20 ENCOUNTER — Other Ambulatory Visit: Payer: Self-pay | Admitting: Family Medicine

## 2023-04-20 NOTE — Telephone Encounter (Signed)
Patient advised Rinvoq is a high risk medication--significant risk for immunosuppression/recurrent infections and has a black box warning for major cardiovascular events-heart attack, stroke, and blood clots.  Rinvoq is reserved for severe cases of psoriatic arthritis.   She had no active inflammation at her last office visit.  If she is concerned about joint swelling we can schedule an ultrasound but it seemed that arava was controlling her psoriatic arthritis.  Rinvoq will not help with osteoarthritic pain. Patient expressed understanding. Patient did not wish to schedule an ultrasound at this time.

## 2023-04-21 ENCOUNTER — Telehealth: Payer: Self-pay | Admitting: Internal Medicine

## 2023-04-21 NOTE — Telephone Encounter (Signed)
PT calling to make appt. In looking at the referral it states they want [place] to send her for a PFT first. Please see if Dr. Sherene Sires will order a PFT before this appt. TY.

## 2023-04-21 NOTE — Telephone Encounter (Signed)
Requested Prescriptions  Pending Prescriptions Disp Refills   LINZESS 72 MCG capsule [Pharmacy Med Name: LINZESS 72 MCG CAPSULE] 90 capsule 3    Sig: TAKE 1 CAPSULE BY MOUTH DAILY BEFORE BREAKFAST.     Gastroenterology: Irritable Bowel Syndrome Failed - 04/20/2023 11:28 AM      Failed - Valid encounter within last 12 months    Recent Outpatient Visits           1 year ago Hyponatremia   [facility] Pickard, [name], MD   1 year ago Bronchitis   [facility] Tanya Nones, [name], MD   1 year ago Bronchitis   [facility] Tanya Nones, [name], MD   1 year ago Anemia, unspecified type   [facility] Donita Brooks, MD   1 year ago Acute non-recurrent pansinusitis   [facility] Valentino Nose, NP       Future Appointments             In 5 days Nahser, Deloris Ping, MD [facility] at [facility]   In 4 months Amada Jupiter, Lenice Llamas [facility] Health Rheumatology - A Dept Of [place]. [facility]             ondansetron (ZOFRAN) 4 MG tablet [Pharmacy Med Name: ONDANSETRON HCL 4 MG TABLET] 20 tablet 1    Sig: TAKE 1 TABLET (4 MG TOTAL) BY MOUTH 4 TIMES A DAY AS NEEDED FOR NAUSEA     Not Delegated - Gastroenterology: Antiemetics - ondansetron Failed - 04/20/2023 11:28 AM      Failed - This refill cannot be delegated      Failed - AST in normal range and within 360 days    AST  Date Value Ref Range Status  04/12/2023 9 (L) 10 - 35 U/L Final         Failed - ALT in normal range and within 360 days    ALT  Date Value Ref Range Status  04/12/2023 4 (L) 6 - 29 U/L Final         Failed - Valid encounter within last 6 months    Recent Outpatient Visits           1 year ago Hyponatremia   [facility] Donita Brooks, MD   1 year ago Bronchitis   [facility] Pickard, [name], MD   1 year ago Bronchitis   [facility] Family  Medicine Tanya Nones, [name], MD   1 year ago Anemia, unspecified type   [facility] Medicine Donita Brooks, MD   1 year ago Acute non-recurrent pansinusitis   [facility] Medicine Valentino Nose, NP       Future Appointments             In 5 days Nahser, Deloris Ping, MD [facility]Care at [facility], [facility]   In 4 months Amada Jupiter, Lenice Llamas [facility] Health Rheumatology - A Dept Of [place]. [facility]

## 2023-04-21 NOTE — Telephone Encounter (Signed)
Requested medication (s) are due for refill today: yes  Requested medication (s) are on the active medication list: yes    Last refill: 01/21/23  @20   1 refill  Future visit scheduled no  Notes to clinic:Not delegated, please review. Thank you.  Requested Prescriptions  Pending Prescriptions Disp Refills   ondansetron (ZOFRAN) 4 MG tablet [Pharmacy Med Name: ONDANSETRON HCL 4 MG TABLET] 20 tablet 1    Sig: TAKE 1 TABLET (4 MG TOTAL) BY MOUTH 4 TIMES A DAY AS NEEDED FOR NAUSEA     Not Delegated - Gastroenterology: Antiemetics - ondansetron Failed - 04/20/2023 11:28 AM      Failed - This refill cannot be delegated      Failed - AST in normal range and within 360 days    AST  Date Value Ref Range Status  04/12/2023 9 (L) 10 - 35 U/L Final         Failed - ALT in normal range and within 360 days    ALT  Date Value Ref Range Status  04/12/2023 4 (L) 6 - 29 U/L Final         Failed - Valid encounter within last 6 months    Recent Outpatient Visits           1 year ago Hyponatremia   [facility] Donita Brooks, MD   1 year ago Bronchitis   [facility] Pickard, [name], MD   1 year ago Bronchitis   [facility] Tanya Nones, [name], MD   1 year ago Anemia, unspecified type   [facility] Medicine Donita Brooks, MD   1 year ago Acute non-recurrent pansinusitis   [facility] Medicine Valentino Nose, NP       Future Appointments             In 5 days Nahser, Deloris Ping, MD [facility] HeartCare at [facility], [facility]   In 4 months Amada Jupiter, Lenice Llamas [facility] Health Rheumatology - A Dept Of [place]. [facility]            Signed Prescriptions Disp Refills   LINZESS 72 MCG capsule 90 capsule 3    Sig: TAKE 1 CAPSULE BY MOUTH DAILY BEFORE BREAKFAST.     Gastroenterology: Irritable Bowel Syndrome Failed - 04/20/2023 11:28 AM      Failed - Valid encounter within last 12 months     Recent Outpatient Visits           1 year ago Hyponatremia   [facility] Pickard, [name], MD   1 year ago Bronchitis   [facility] Medicine Tanya Nones, [name], MD   1 year ago Bronchitis   [facility] Medicine Tanya Nones, [name], MD   1 year ago Anemia, unspecified type   [facility] Medicine Donita Brooks, MD   1 year ago Acute non-recurrent pansinusitis   [facility] Family Medicine Valentino Nose, NP       Future Appointments             In 5 days Nahser, Deloris Ping, MD [facility] Health HeartCare at [facility], [facility]   In 4 months Amada Jupiter, Lenice Llamas [facility] Health Rheumatology - A Dept Of [place]. [facility]

## 2023-04-25 ENCOUNTER — Encounter: Payer: Self-pay | Admitting: Cardiovascular Disease

## 2023-04-25 NOTE — Progress Notes (Unsigned)
Cardiology Office Note:  .   Date:  04/26/2023  ID:  Janet Peterson, DOB Dec 27, 1950, MRN [id number] PCP: Donita Brooks, MD  [facility] Providers Cardiologist: Neal Trulson   History of Present Illness: .    Seen with [name] ( husband)   Janet Peterson is a 72 y.o. female with a history of hypertension, dyspnea, fibromyalgia,  anemia ,  psoriasis ( with arthritis) .  We are asked to see her today by Dr. Tanya Nones for further evaluation of her dyspnea and elevated BNP   She presented to her primary care doctor with some shortness of breath.  D-dimer was noted to be elevated. CTA of the lungs revealed no evidence of pulmonary emboli.  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  BP has been running high. 170s Was started on amlodipine and her blood pressure is 132/82 today. She avoids salt but she notes that they go out to eat 5 times a week   She has been on a diuretic in the past   Is starting back on her exercise  Went to [place] for 17 days     Sept. 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  She has had difficulty taking diuretics in the past.  I discussed the Carmela Hurt, RPH:   Doxepin - TCA Tricyclic antidepressants (TCAs) have been rarely associated with syndrome of inappropriate antidiuretic hormone secretion (SIADH) and/or hyponatremia, predominately in older adults      Dec. 2,  2024  Janet Peterson is seen for follow up of her HTN Also has hyponatremia ( which has improved )  No CP Has chronic dyspnea Has been referred to pulmonary medicine   Her echocardiogram reveals normal left ventricular systolic function.  We were not able to evaluate her diastolic function.  She has trivial valvular disease.  The ascending aorta was mildly dilated.  Coronary CT angiogram showed a very minimally dilated ascending aorta measuring 3.9 cm. She was found to have mild coronary plaque but no obstructive disease      ROS:   Studies Reviewed: .         Risk Assessment/Calculations:          Physical Exam: Blood pressure 136/89, pulse 96, height 5\' 3"  (1.6 m), weight 195 lb 6.4 oz (88.6 kg), SpO2 92%.       GEN:  Well nourished, well developed in no acute distress HEENT: Normal NECK: No JVD; No carotid bruits LYMPHATICS: No lymphadenopathy CARDIAC: RRR , no murmurs, rubs, gallops RESPIRATORY:  Clear to auscultation without rales, wheezing or rhonchi  ABDOMEN: Soft, non-tender, non-distended MUSCULOSKELETAL:  No edema; No deformity  SKIN: Warm and dry NEUROLOGIC:  Alert and oriented x 3    ASSESSMENT AND PLAN: .     1.  Shortness of breath with exertion:   seems to be a chronic issue.  Her echocardiogram reveals normal left ventricular systolic function.  We were unable to evaluate her diastolic function.  She has minimal valvular disease.  Coronary CTA revealed no significant obstructive coronary artery disease.  At this point she appears to be very stable from a cardiac standpoint.  She will follow-up with her primary medical doctor and with pulmonary medicine for further evaluation of her shortness of breath with exertion.    2.  Hypertension:   Blood pressure was  mildly elevated initially.   BP came down after several minutes    3.  Hyponatremia :   We have had difficulty in keeping her on a diuretic because of her hyponatremia.  I discussed the case with  our clinical pharmacologist.  I have posted her note below.  I discussed the Carmela Hurt, RPH:   Doxepin - TCA Tricyclic antidepressants (TCAs) have been rarely associated with syndrome of inappropriate antidiuretic hormone secretion (SIADH) and/or hyponatremia, predominately in older adults    also I noticed fluoxetine - SSRI tend to do that too in elderly pts   So her Prozac and her Doxepin may be contributing to her hyponatremia .     4.  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  She will follow up with her primary MD for this  We would be happy to see her in the future if needed.     Dispo: PRN   Signed, Kristeen Miss, MD

## 2023-04-26 ENCOUNTER — Ambulatory Visit: Payer: Medicare Other | Attending: Cardiovascular Disease | Admitting: Cardiovascular Disease

## 2023-04-26 ENCOUNTER — Encounter: Payer: Self-pay | Admitting: Cardiovascular Disease

## 2023-04-26 VITALS — BP 136/89 | HR 96 | Ht 63.0 in | Wt 195.4 lb

## 2023-04-26 DIAGNOSIS — I10 Essential (primary) hypertension: Secondary | ICD-10-CM

## 2023-04-26 DIAGNOSIS — R0602 Shortness of breath: Secondary | ICD-10-CM | POA: Diagnosis not present

## 2023-04-26 NOTE — Telephone Encounter (Signed)
PCP requests for patient to see Pulm provider and then PFT can be ordered based on provider recommendations.   Thank you!

## 2023-04-26 NOTE — Patient Instructions (Signed)
Medication Instructions:  Your physician recommends that you continue on your current medications as directed. Please refer to the Current Medication list given to you today.  *If you need a refill on your cardiac medications before your next appointment, please call your pharmacy*  Lab Work: None ordered today  Testing/Procedures: None ordered today  Follow-Up:  As needed

## 2023-04-26 NOTE — Telephone Encounter (Signed)
Message sent to PCP for clarification of referral as it states "PFT for dyspnea".

## 2023-05-03 ENCOUNTER — Telehealth: Payer: Self-pay | Admitting: Pharmacist

## 2023-05-03 MED ORDER — OXYCODONE-ACETAMINOPHEN 5-325 MG PO TABS: 1.0000 | ORAL_TABLET | Freq: Three times a day (TID) | ORAL | 0 refills | Status: DC | PRN

## 2023-05-03 NOTE — Telephone Encounter (Signed)
Per patient Praluent will cost her more next year. She has a latex allergy - broke out in rash when they rubbed something with latex all over her body during surgery and gets sores in her mouth if the dentist uses latex gloves.  We talked about the little bit of latex in tip of cap that needle sits on. Patient aware there is some risk she could have a reaction. Patient wishes to try Repatha. Will submit PA.

## 2023-05-04 ENCOUNTER — Telehealth: Payer: Self-pay | Admitting: Pharmacy Technician

## 2023-05-04 ENCOUNTER — Other Ambulatory Visit (HOSPITAL_COMMUNITY): Payer: Self-pay

## 2023-05-04 MED ORDER — REPATHA SURECLICK 140 MG/ML ~~LOC~~ SOAJ: 1.0000 mL | SUBCUTANEOUS | 11 refills | Status: DC

## 2023-05-04 NOTE — Telephone Encounter (Signed)
Pharmacy Patient Advocate Encounter   Received notification from Pt Calls Messages that prior authorization for repatha is required/requested.   Insurance verification completed.   The patient is insured through  [employer]  .   Per test claim: The current 05/04/23 day co-pay is, $0.00   .  No PA needed at this time. This test claim was processed through [facility]- copay amounts may vary at other pharmacies due to pharmacy/plan contracts, or as the patient moves through the different stages of their insurance plan.    Test claim attached

## 2023-05-04 NOTE — Telephone Encounter (Signed)
Patient made aware and Rx sent

## 2023-05-10 ENCOUNTER — Telehealth: Payer: Self-pay

## 2023-05-10 NOTE — Telephone Encounter (Signed)
Pt's husband asks if pt can get a 90 day supply of the Zofran she is taking due to nausea from the [facility]?

## 2023-05-11 ENCOUNTER — Other Ambulatory Visit: Payer: Self-pay | Admitting: Family Medicine

## 2023-05-11 MED ORDER — ONDANSETRON HCL 4 MG PO TABS: 4.0000 mg | ORAL_TABLET | Freq: Four times a day (QID) | ORAL | 1 refills | Status: AC | PRN

## 2023-05-17 ENCOUNTER — Other Ambulatory Visit: Payer: Self-pay | Admitting: Family Medicine

## 2023-05-25 ENCOUNTER — Other Ambulatory Visit: Payer: Self-pay | Admitting: Family Medicine

## 2023-05-27 ENCOUNTER — Other Ambulatory Visit: Payer: Self-pay

## 2023-05-27 ENCOUNTER — Emergency Department (HOSPITAL_BASED_OUTPATIENT_CLINIC_OR_DEPARTMENT_OTHER): Payer: Medicare Other | Admitting: Radiology

## 2023-05-27 ENCOUNTER — Emergency Department (HOSPITAL_BASED_OUTPATIENT_CLINIC_OR_DEPARTMENT_OTHER)
Admission: EM | Admit: 2023-05-27 | Discharge: 2023-05-27 | Disposition: A | Discharge status: Home / Self Care | Payer: Medicare Other | Attending: Emergency Medicine | Admitting: Emergency Medicine

## 2023-05-27 DIAGNOSIS — W01198A Fall on same level from slipping, tripping and stumbling with subsequent striking against other object, initial encounter: Secondary | ICD-10-CM | POA: Insufficient documentation

## 2023-05-27 DIAGNOSIS — Y92019 Unspecified place in single-family (private) house as the place of occurrence of the external cause: Secondary | ICD-10-CM | POA: Insufficient documentation

## 2023-05-27 DIAGNOSIS — S2231XA Fracture of one rib, right side, initial encounter for closed fracture: Secondary | ICD-10-CM | POA: Insufficient documentation

## 2023-05-27 DIAGNOSIS — S299XXA Unspecified injury of thorax, initial encounter: Secondary | ICD-10-CM | POA: Diagnosis present

## 2023-05-27 LAB — CBC
HCT: 32.8 % — ABNORMAL LOW (ref 36.0–46.0)
Hemoglobin: 10.6 g/dL — ABNORMAL LOW (ref 12.0–15.0)
MCH: 30.7 pg (ref 26.0–34.0)
MCHC: 32.3 g/dL (ref 30.0–36.0)
MCV: 95.1 fL (ref 80.0–100.0)
Platelets: 243 10*3/uL (ref 150–400)
RBC: 3.45 MIL/uL — ABNORMAL LOW (ref 3.87–5.11)
RDW: 14.4 % (ref 11.5–15.5)
WBC: 6.4 10*3/uL (ref 4.0–10.5)
nRBC: 0 % (ref 0.0–0.2)

## 2023-05-27 LAB — COMPREHENSIVE METABOLIC PANEL
ALT: 5 U/L (ref 0–44)
AST: 10 U/L — ABNORMAL LOW (ref 15–41)
Albumin: 3.6 g/dL (ref 3.5–5.0)
Alkaline Phosphatase: 99 U/L (ref 38–126)
Anion gap: 11 (ref 5–15)
BUN: 12 mg/dL (ref 8–23)
CO2: 23 mmol/L (ref 22–32)
Calcium: 9 mg/dL (ref 8.9–10.3)
Chloride: 101 mmol/L (ref 98–111)
Creatinine, Ser: 0.93 mg/dL (ref 0.44–1.00)
GFR, Estimated: >60 mL/min (ref >60)
Glucose, Bld: 106 mg/dL — ABNORMAL HIGH (ref 70–99)
Potassium: 3.5 mmol/L (ref 3.5–5.1)
Sodium: 135 mmol/L (ref 135–145)
Total Bilirubin: 0.2 mg/dL (ref 0.0–1.2)
Total Protein: 6.2 g/dL — ABNORMAL LOW (ref 6.5–8.1)

## 2023-05-27 LAB — URINALYSIS, ROUTINE W REFLEX MICROSCOPIC
Bacteria, UA: NONE SEEN
Bilirubin Urine: NEGATIVE
Glucose, UA: NEGATIVE mg/dL
Hgb urine dipstick: NEGATIVE
Ketones, ur: NEGATIVE mg/dL
Nitrite: NEGATIVE
Protein, ur: NEGATIVE mg/dL
Specific Gravity, Urine: 1.01 (ref 1.005–1.030)
pH: 6 (ref 5.0–8.0)

## 2023-05-27 LAB — LIPASE, BLOOD: Lipase: 14 U/L (ref 11–51)

## 2023-05-27 MED ORDER — OXYCODONE HCL 5 MG PO TABS: 5.0000 mg | ORAL_TABLET | Freq: Four times a day (QID) | ORAL | 0 refills | Status: DC | PRN

## 2023-05-27 MED ORDER — OXYCODONE HCL 5 MG PO TABS
5.0000 mg | ORAL_TABLET | Freq: Once | ORAL | Status: AC
  Administered 2023-05-27: 5 mg via ORAL
  Filled 2023-05-27: qty 1

## 2023-05-27 NOTE — ED Triage Notes (Signed)
 Fell Tuesday morning. Foot slipped-fell on an ottoman-pain to right flank. Right flank pain now wrapping around front. Worse with deep breathing. Nausea at baseline due to semaglutide.

## 2023-05-27 NOTE — ED Provider Notes (Signed)
 [place] EMERGENCY DEPARTMENT AT [facility] Provider Note   CSN: [id number] Arrival date & time: 05/27/23  1600     History Chief Complaint  Patient presents with   Fall    HPI Janet Peterson is a 73 y.o. female presenting for chief complaint of ground-level fall.  73 year old female.  States that she fell over an ottoman at home hitting the side of her recliner on her right thoracic cavity.  Having point tenderness at the spot.  Denies fevers chills nausea vomiting shortness of breath.  No known sick contacts.  Patient's recorded medical, surgical, social, medication list and allergies were reviewed in the Snapshot window as part of the initial history.   Review of Systems   Review of Systems  Constitutional:  Negative for chills and fever.  HENT:  Negative for ear pain and sore throat.   Eyes:  Negative for pain and visual disturbance.  Respiratory:  Positive for shortness of breath. Negative for cough.   Cardiovascular:  Negative for chest pain and palpitations.  Gastrointestinal:  Negative for abdominal pain and vomiting.  Genitourinary:  Negative for dysuria and hematuria.  Musculoskeletal:  Negative for arthralgias and back pain.  Skin:  Negative for color change and rash.  Neurological:  Negative for seizures and syncope.  All other systems reviewed and are negative.   Physical Exam Updated Vital Signs BP (!) 174/99   Pulse 80   Temp 98.3 F (36.8 C) (Oral)   Resp 18   SpO2 93%  Physical Exam Vitals and nursing note reviewed.  Constitutional:      General: She is not in acute distress.    Appearance: She is well-developed.  HENT:     Head: Normocephalic and atraumatic.  Eyes:     Conjunctiva/sclera: Conjunctivae normal.  Cardiovascular:     Rate and Rhythm: Normal rate and regular rhythm.     Heart sounds: No murmur heard. Pulmonary:     Effort: Pulmonary effort is normal. No respiratory distress.     Breath sounds: Normal breath sounds.   Abdominal:     General: There is no distension.     Palpations: Abdomen is soft.     Tenderness: There is no abdominal tenderness. There is no right CVA tenderness or left CVA tenderness.  Musculoskeletal:        General: Signs of injury present. No swelling or tenderness. Normal range of motion.     Cervical back: Neck supple.  Skin:    General: Skin is warm and dry.  Neurological:     General: No focal deficit present.     Mental Status: She is alert and oriented to person, place, and time. Mental status is at baseline.     Cranial Nerves: No cranial nerve deficit.      ED Course/ Medical Decision Making/ A&P    Procedures Procedures   Medications Ordered in ED Medications  oxyCODONE  (Oxy IR/ROXICODONE ) immediate release tablet 5 mg (5 mg Oral Given 05/27/23 2210)    Medical Decision Making:   Is a delightful 73 year old female presented with ground-level fall.  She is TTP over right thoracic cavity.  History of present illness and physical exam findings are most consistent with acute rib fracture. X-ray confirmed.  Lab work without evidence of metabolic, hematologic or other acute intra-abdominal pathology. Incentive spirometry 1000 on initial approach.  Discussed with patient she would like to proceed with treatment with further pain medication.  After additional of oxycodone , [place] spirometry improved above  1250. Patient educated on importance of continuing this.  Reassessed frequently in no acute distress.  Patient feels comfortable outpatient care management.  Family ember at bedside updated on plan of care and in agreement.  Patient discharged without further acute events.  Disposition:  I have considered need for hospitalization, however, considering all of the above, I believe this patient is stable for discharge at this time.  Patient/family educated about specific return precautions for given chief complaint and symptoms.  Patient/family educated about follow-up with  PCP.     Patient/family expressed understanding of return precautions and need for follow-up. Patient spoken to regarding all imaging and laboratory results and appropriate follow up for these results. All education provided in verbal form with additional information in written form. Time was allowed for answering of patient questions. Patient discharged.    Emergency Department Medication Summary:   Medications  oxyCODONE  (Oxy IR/ROXICODONE ) immediate release tablet 5 mg (5 mg Oral Given 05/27/23 2210)        Clinical Impression:  1. Closed fracture of one rib of right side, initial encounter      Discharge   Final Clinical Impression(s) / ED Diagnoses Final diagnoses:  Closed fracture of one rib of right side, initial encounter    Rx / DC Orders ED Discharge Orders          Ordered    oxyCODONE  (ROXICODONE ) 5 MG immediate release tablet  Every 6 hours PRN        05/27/23 2250              Jerral Meth, MD 05/27/23 2253

## 2023-05-28 ENCOUNTER — Telehealth: Payer: Self-pay | Admitting: Family Medicine

## 2023-05-28 NOTE — Telephone Encounter (Signed)
VOB submitted for Synvisc, Right knee(s) BV pending

## 2023-05-28 NOTE — Telephone Encounter (Signed)
  Received fax from Cover[employer]ymeds Prior Auth needed for INGREZZA 80 MG capsule  KEY: [id number]

## 2023-05-29 ENCOUNTER — Other Ambulatory Visit: Payer: Self-pay | Admitting: Family Medicine

## 2023-05-31 DIAGNOSIS — G2401 Drug induced subacute dyskinesia: Secondary | ICD-10-CM | POA: Insufficient documentation

## 2023-05-31 NOTE — Telephone Encounter (Signed)
 PA FOR [facility] FAXED TO [employer] AT [phone]. PT ADVISED VIA MY CHART MESSAGE. MJP,LPN

## 2023-06-01 NOTE — Telephone Encounter (Signed)
 Please call to schedule visco injections.  Approved for Synvisc, Right knee(s). [employer] Deductible does apply Once deductible has been met $125 (met $0), patient is responsible for $40 copay Authorization #[id number] Authorization dates:  05/28/2023 to 05/27/2024

## 2023-06-01 NOTE — Progress Notes (Signed)
 [name], female    DOB: 20-Feb-1951    MRN: [id number]   Brief patient profile:  36  yowf  stopped smoking 1996  s resp symptoms at wt=150 referred to pulmonary clinic in [place]  06/02/2023 by Dr Janet Peterson for doe progressive x 10 y    Took allergy shots in [place] x 20 y > helped a little sinus problems but then had  2 nasal surgeries since then with less sinus infections since   Played pickleball prior to T spine surgery in 2013 and downhill after that with ex tol      History of Present Illness  06/02/2023  Pulmonary/ 1st office eval/ Janet Peterson / Janet Peterson Office re doe @ wt 190  Chief Complaint  Patient presents with   Establish Care   Shortness of Breath  Dyspnea:  last did ex bike April 2024 x 5 minutes onlyat resistance set at 3   Cough: none  Sleep: flat bed with one pillow s resp cc  SABA use: no better with albuterol   02: none   No obvious day to day or daytime pattern/variability or assoc excess/ purulent sputum or mucus plugs or hemoptysis or cp or chest tightness, subjective wheeze or overt sinus or hb symptoms.    Also denies any obvious fluctuation of symptoms with weather or environmental changes or other aggravating or alleviating factors except as outlined above   No unusual exposure hx or h/o childhood pna/ asthma or knowledge of premature birth.  Current Allergies, Complete Past Medical History, Past Surgical History, Family History, and Social History were reviewed in [employer] record.  ROS  The following are not active complaints unless bolded Hoarseness, sore throat, dysphagia, dental problems, itching, sneezing,  nasal congestion or discharge of excess mucus or purulent secretions, ear ache,   fever, chills, sweats, unintended wt loss or wt gain, classically pleuritic or exertional cp,  orthopnea pnd or arm/hand swelling  or leg swelling, presyncope, palpitations, abdominal pain, anorexia, nausea, vomiting, diarrhea  or change in  bowel habits or change in bladder habits, change in stools or change in urine, dysuria, hematuria,  rash, arthralgias, visual complaints, headache, numbness, weakness or ataxia or problems with walking or coordination,  change in mood or  memory.            Outpatient Medications Prior to Visit  Medication Sig Dispense Refill   albuterol  (VENTOLIN  HFA) 108 (90 Base) MCG/ACT inhaler TAKE 2 PUFFS BY MOUTH EVERY 6 HOURS AS NEEDED FOR WHEEZE OR SHORTNESS OF BREATH 8.5 each 1   amLODipine  (NORVASC ) 5 MG tablet Take 1 tablet (5 mg total) by mouth daily. 90 tablet 3   buPROPion  (WELLBUTRIN  XL) 300 MG 24 hr tablet Take 300 mg by mouth every morning.  1   ciprofloxacin  (CILOXAN ) 0.3 % ophthalmic solution Place 1 drop into the left eye.     clonazePAM  (KLONOPIN ) 1 MG tablet TAKE 1 TABLET BY MOUTH EVERY DAY AS NEEDED FOR ANXIETY (Patient taking differently: Take 1 mg by mouth daily as needed for anxiety.) 30 tablet 1   cyclobenzaprine  (FLEXERIL ) 10 MG tablet Take 1 tablet (10 mg total) by mouth 3 (three) times daily as needed for muscle spasms. 30 tablet 0   dexlansoprazole  (DEXILANT ) 60 MG capsule Take 1 capsule (60 mg total) by mouth daily. 90 capsule 0   diazepam  (VALIUM ) 5 MG tablet Take 5 mg by mouth. Used for testing like MRI's     doxepin  (SINEQUAN ) 50 MG capsule  Take 50 mg by mouth.     Evolocumab  (REPATHA  SURECLICK) 140 MG/ML SOAJ Inject 140 mg into the skin every 14 (fourteen) days. 2 mL 11   ezetimibe  (ZETIA ) 10 MG tablet TAKE 1 TABLET BY MOUTH EVERY DAY 90 tablet 2   finasteride  (PROSCAR ) 5 MG tablet Take 1 tablet (5 mg total) by mouth daily. 90 tablet 1   FLUoxetine  (PROZAC ) 20 MG capsule Take 20 mg by mouth 3 (three) times daily.     fluticasone  (FLONASE ) 50 MCG/ACT nasal spray Place 2 sprays into both nostrils daily as needed for allergies. 16 g 11   hydrocortisone  cream 1 % Apply 1 Application topically 2 (two) times daily. 30 g 0   INGREZZA  80 MG capsule Take one capsule by mouth daily 30  capsule 1   leflunomide  (ARAVA ) 20 MG tablet TAKE 1 TABLET BY MOUTH EVERY DAY 90 tablet 0   LINZESS  72 MCG capsule TAKE 1 CAPSULE BY MOUTH DAILY BEFORE BREAKFAST. 90 capsule 3   metoprolol  succinate (TOPROL -XL) 50 MG 24 hr tablet TAKE ONE TABLET BY MOUTH DAILY WITH A MEAL. 90 tablet 0   ondansetron  (ZOFRAN ) 4 MG tablet Take 1 tablet (4 mg total) by mouth 4 (four) times daily as needed for nausea. 90 tablet 1   oxyCODONE  (ROXICODONE ) 5 MG immediate release tablet Take 1 tablet (5 mg total) by mouth every 6 (six) hours as needed for severe pain (pain score 7-10). 15 tablet 0   oxyCODONE -acetaminophen  (PERCOCET) 5-325 MG tablet Take 1 tablet by mouth every 8 (eight) hours as needed for severe pain (pain score 7-10). Ok to fill after 01/01/23 60 tablet 0   oxyCODONE -acetaminophen  (PERCOCET) 5-325 MG tablet Take 1 tablet by mouth every 8 (eight) hours as needed for severe pain (pain score 7-10). Ok to fill after 01/01/23 60 tablet 0   SEMAGLUTIDE ,0.25 OR 0.5MG /DOS, [place] Inject 40 Units into the skin once a week.     tiZANidine  (ZANAFLEX ) 4 MG capsule Take 4 mg by mouth 3 (three) times daily as needed for muscle spasms.     triamcinolone  cream (KENALOG ) 0.1 % Apply 1 Application topically 2 (two) times daily as needed.     valsartan  (DIOVAN ) 320 MG tablet TAKE 1 TABLET BY MOUTH EVERY DAY 90 tablet 3   doxazosin  (CARDURA ) 4 MG tablet TAKE 1 TABLET BY MOUTH EVERY DAY 90 tablet 1   metoprolol  tartrate (LOPRESSOR ) 100 MG tablet Take 1 tablet by mouth two hours prior to scan 1 tablet 0   SEMAGLUTIDE ,0.25 OR 0.5MG /DOS, [place] Inject into the skin.     No facility-administered medications prior to visit.    Past Medical History:  Diagnosis Date   Ankylosing spondylitis (HCC)    Anxiety and depression    Depression    Fibromyalgia    GERD (gastroesophageal reflux disease)    Hiatal hernia    per patient, dx by GI    History of basal cell carcinoma excision    FACE, 1992  &  1996   History of hiatal hernia     History of thrush    Hyperlipidemia    Hypertension    Insomnia    Left breast mass    OCD (obsessive compulsive disorder)    Osteopenia    PONV (postoperative nausea and vomiting)    Psoriatic arthritis (HCC)    PVC (premature ventricular contraction)    Rheumatoid arthritis (HCC)       Objective:     BP 114/84   Pulse  98   Ht 5' 3 (1.6 m)   Wt 190 lb (86.2 kg)   SpO2 95%   BMI 33.66 kg/m   SpO2: 95 %  rA  Somber amb Mod obese (by BMI)  wf nad     HEENT : Oropharynx  clear / edentulous      Nasal turbinates nl    NECK :  without  apparent JVD/ palpable Nodes/TM    LUNGS: no acc muscle use,  Nl contour chest which is clear to A and P bilaterally without cough on insp or exp maneuvers   CV:  RRR  no s3 or murmur or increase in P2, and trace bilateral LE edema   ABD:  soft and nontender   MS:  Gait slow slt unsteady   ext warm without deformities Or obvious joint restrictions  calf tenderness, cyanosis or clubbing    SKIN: warm and dry without lesions    NEURO:  alert, approp, nl sensorium with  no motor or cerebellar deficits apparent.          Assessment   DOE (dyspnea on exertion) Stopped smoking 1996 at wt 160  - onset p back surgery 2013 - CTa  10/08/22 > neg PE  - Echo 11/16/22  Mild LVH with Mild LAE and bnp = 137 - 06/02/2023  @ wt 190  Walked on RA  x  1  lap(s) =  approx 150  ft  @ slow pace, stopped due to sob = fatigue with lowest 02 sats 95%     When respiratory symptoms begin or become refractory well after a patient reports complete smoking cessation,  Especially when this wasn't the case while they were smoking, a red flag is raised based on the work of Dr Genette which states:  if you quit smoking when your best day FEV1 is still well preserved it is highly unlikely you will progress to severe disease.  That is to say, once the smoking stops,  the symptoms should not suddenly erupt or markedly worsen.  If so, the differential diagnosis  should include  obesity/deconditioning,  LPR/Reflux/Aspiration syndromes,  occult CHF (eg diastolic dysfunction which hasn't been entirely ruled out here) , or  especially side effect of medications commonly used in this population (none of the usual suspects listed.  Rec  Reconditioning using sub max level of bicycle ergometry then do CPST after 6 weeks if see no improvement   PFTs next available   Pulmonary f/u prn    Each maintenance medication was reviewed in detail including emphasizing most importantly the difference between maintenance and prns and under what circumstances the prns are to be triggered using an action plan format where appropriate.  Total time for H and P, chart review, counseling, reviewing hfa device(s) , directly observing portions of ambulatory 02 saturation study/ and generating customized AVS unique to this office visit / same day charting = 60 min new pt eval                    Ozell America, MD 06/02/2023

## 2023-06-02 ENCOUNTER — Ambulatory Visit: Payer: Medicare Other | Admitting: Internal Medicine

## 2023-06-02 ENCOUNTER — Encounter: Payer: Self-pay | Admitting: Internal Medicine

## 2023-06-02 VITALS — BP 114/84 | HR 98 | Ht 63.0 in | Wt 190.0 lb

## 2023-06-02 DIAGNOSIS — R0609 Other forms of dyspnea: Secondary | ICD-10-CM

## 2023-06-02 NOTE — Patient Instructions (Signed)
 My office will be contacting you by phone for referral  for PFTs either at [facility] or [facility] whichever is first   - if you don't hear back from my office within one week please call us  back or notify us  thru MyChart and we'll address it right away.   An exercise bike or recumbent bike would be the best option to build up your endurance with goal of 30 minutes or exercise daily where you are aware your are short of breath but never out of breath.  If you do this a minimum of 3 x weekly for a month and not noting improvement then we can schedule a CPST in [place] at the heart clinic there.   Pulmonary follow up is as needed once the PFT is complete

## 2023-06-02 NOTE — Assessment & Plan Note (Addendum)
 Stopped smoking 1996 at wt 160  - onset p back surgery 2013 - CTa  10/08/22 > neg PE  - Echo 11/16/22  Mild LVH with Mild LAE and bnp = 137 - 06/02/2023  @ wt 190  Walked on RA  x  1  lap(s) =  approx 150  ft  @ slow pace, stopped due to sob = fatigue with lowest 02 sats 95%     When respiratory symptoms begin or become refractory well after a patient reports complete smoking cessation,  Especially when this wasn't the case while they were smoking, a red flag is raised based on the work of Dr Genette which states:  if you quit smoking when your best day FEV1 is still well preserved it is highly unlikely you will progress to severe disease.  That is to say, once the smoking stops,  the symptoms should not suddenly erupt or markedly worsen.  If so, the differential diagnosis should include  obesity/deconditioning,  LPR/Reflux/Aspiration syndromes,  occult CHF (eg diastolic dysfunction which hasn't been entirely ruled out here) , or  especially side effect of medications commonly used in this population (none of the usual suspects listed.  Rec  Reconditioning using sub max level of bicycle ergometry then do CPST after 6 weeks if see no improvement   PFTs next available   Pulmonary f/u prn    Each maintenance medication was reviewed in detail including emphasizing most importantly the difference between maintenance and prns and under what circumstances the prns are to be triggered using an action plan format where appropriate.  Total time for H and P, chart review, counseling, reviewing hfa device(s) , directly observing portions of ambulatory 02 saturation study/ and generating customized AVS unique to this office visit / same day charting  > 60 min for    refractory respiratory  symptoms of uncertain etiology

## 2023-06-03 DIAGNOSIS — M1711 Unilateral primary osteoarthritis, right knee: Secondary | ICD-10-CM | POA: Insufficient documentation

## 2023-06-03 NOTE — Telephone Encounter (Signed)
 Patient states she has decided to have right knee surgery due to torn cartilage and will no longer need the visco injections.

## 2023-06-07 ENCOUNTER — Ambulatory Visit: Payer: Medicare Other | Admitting: Family Medicine

## 2023-06-07 ENCOUNTER — Encounter: Payer: Self-pay | Admitting: Family Medicine

## 2023-06-07 VITALS — BP 132/82 | HR 95 | Temp 97.7°F | Ht 63.0 in | Wt 190.2 lb

## 2023-06-07 DIAGNOSIS — D649 Anemia, unspecified: Secondary | ICD-10-CM | POA: Diagnosis not present

## 2023-06-07 NOTE — Progress Notes (Signed)
 Subjective:    Patient ID: Janet Peterson, female    DOB: 1951-02-15, 73 y.o.   MRN: [id number]  Patient is a very pleasant 73 year old Caucasian female here today to follow-up some blood work.  She fell on January 2 and went to the emergency room and was diagnosed with a rib fracture.  At that time, her hemoglobin was found to be 10.6.  MCV was normal.  Patient's hemoglobin in November was 12.7.  Therefore there has been a concerning drop in her blood counts from November to January.  She denies any vaginal bleeding.  She denies any epistaxis or hematemesis.  He denies any melena or hematochezia.  He had a colonoscopy 2021 which did show friable ulcerated mucosa in the colon.  Biopsy showed mild colitis. Past Medical History:  Diagnosis Date   Ankylosing spondylitis (HCC)    Anxiety and depression    Depression    Fibromyalgia    GERD (gastroesophageal reflux disease)    Hiatal hernia    per patient, dx by GI    History of basal cell carcinoma excision    FACE, 1992  &  1996   History of hiatal hernia    History of thrush    Hyperlipidemia    Hypertension    Insomnia    Left breast mass    OCD (obsessive compulsive disorder)    Osteopenia    PONV (postoperative nausea and vomiting)    Psoriatic arthritis (HCC)    PVC (premature ventricular contraction)    Rheumatoid arthritis (HCC)    Past Surgical History:  Procedure Laterality Date   BIOPSY  09/29/2019   Procedure: BIOPSY;  Surgeon: Saintclair Jasper, MD;  Location: [facility];  Service: Gastroenterology;;   BREAST EXCISIONAL BIOPSY Right    BREAST EXCISIONAL BIOPSY Left 05/04/2018   BREAST LUMPECTOMY WITH RADIOACTIVE SEED LOCALIZATION Left 05/04/2018   Procedure: LEFT BREAST LUMPECTOMY WITH RADIOACTIVE SEED LOCALIZATION AND LEFT BREAST NIPPLE BIOPSY;  Surgeon: Curvin Deward MOULD, MD;  Location: [facility];  Service: General;  Laterality: Left;   BREAST SURGERY  05/25/1973   lumpectomy-- benign   BUNIONECTOMY   05/25/1989   CATARACT EXTRACTION W/ INTRAOCULAR LENS  IMPLANT, BILATERAL  05/25/1998   CERVICAL FUSION  07/24/2011   C5 -- C7   CESAREAN SECTION  05/26/1983   COLONOSCOPY WITH PROPOFOL  N/A 09/29/2019   Procedure: COLONOSCOPY WITH PROPOFOL ;  Surgeon: Saintclair Jasper, MD;  Location: [facility];  Service: Gastroenterology;  Laterality: N/A;   DISTAL INTERPHALANGEAL JOINT FUSION Right 03/14/2015   Procedure: RIGHT LONG FINGER DISTAL INTERPHALANGEAL JOINT ARTHRODESIS;  Surgeon: Prentice Pagan, MD;  Location: [facility];  Service: Orthopedics;  Laterality: Right;   ESOPHAGOGASTRODUODENOSCOPY (EGD) WITH PROPOFOL  N/A 09/29/2019   Procedure: ESOPHAGOGASTRODUODENOSCOPY (EGD) WITH PROPOFOL ;  Surgeon: Saintclair Jasper, MD;  Location: [facility];  Service: Gastroenterology;  Laterality: N/A;   EYE SURGERY  05/25/1993   rk (laser surgery), semi cornea transplant, detacted retina,  fluid removal   HYSTEROSCOPY WITH D & C N/A 12/09/2020   Procedure: DILATATION AND CURETTAGE /HYSTEROSCOPY;  Surgeon: Storm Setter, DO;  Location: [facility];  Service: Gynecology;  Laterality: N/A;   KNEE ARTHROSCOPY Left 03/03/2004   POLYPECTOMY  09/29/2019   Procedure: POLYPECTOMY;  Surgeon: Saintclair Jasper, MD;  Location: [facility];  Service: Gastroenterology;;   POSTERIOR VITRECTOMY RIGHT EYE AND LASER   10/27/1999   SHOULDER SURGERY Right 05/26/1995   SKIN BIOPSY  05/2022   lower stomach  SPINAL FIXATION SURGERY W/ IMPLANT  2013 rod #1//  2014  rod 2   S1 -- T10  (rod #1)//   S1 -- T4 (rod #2)   THORACIC FUSION  03/13/2013   REMOVAL HARDWARE/  BONE GRAFT FUSION T10//  REVISION OF RODS   TOTAL KNEE ARTHROPLASTY Left 12/14/2005   UVULOPALATOPHARYNGOPLASTY  04/26/2006   w/  TONSILLECTOMY/  TURBINATE REDUCTIONS/  BILATERAL ANTERIOR ETHMOIDECTOMY   Current Outpatient Medications on File Prior to Visit  Medication Sig Dispense Refill   albuterol  (VENTOLIN  HFA) 108 (90 Base) MCG/ACT inhaler TAKE  2 PUFFS BY MOUTH EVERY 6 HOURS AS NEEDED FOR WHEEZE OR SHORTNESS OF BREATH 8.5 each 1   amLODipine  (NORVASC ) 5 MG tablet Take 1 tablet (5 mg total) by mouth daily. 90 tablet 3   buPROPion  (WELLBUTRIN  XL) 300 MG 24 hr tablet Take 300 mg by mouth every morning.  1   ciprofloxacin  (CILOXAN ) 0.3 % ophthalmic solution Place 1 drop into the left eye.     clonazePAM  (KLONOPIN ) 1 MG tablet TAKE 1 TABLET BY MOUTH EVERY DAY AS NEEDED FOR ANXIETY (Patient taking differently: Take 1 mg by mouth daily as needed for anxiety.) 30 tablet 1   cyclobenzaprine  (FLEXERIL ) 10 MG tablet Take 1 tablet (10 mg total) by mouth 3 (three) times daily as needed for muscle spasms. 30 tablet 0   dexlansoprazole  (DEXILANT ) 60 MG capsule Take 1 capsule (60 mg total) by mouth daily. 90 capsule 0   diazepam  (VALIUM ) 5 MG tablet Take 5 mg by mouth. Used for testing like MRI's     doxepin  (SINEQUAN ) 50 MG capsule Take 50 mg by mouth.     Evolocumab  (REPATHA  SURECLICK) 140 MG/ML SOAJ Inject 140 mg into the skin every 14 (fourteen) days. 2 mL 11   ezetimibe  (ZETIA ) 10 MG tablet TAKE 1 TABLET BY MOUTH EVERY DAY 90 tablet 2   finasteride  (PROSCAR ) 5 MG tablet Take 1 tablet (5 mg total) by mouth daily. 90 tablet 1   FLUoxetine  (PROZAC ) 20 MG capsule Take 20 mg by mouth 3 (three) times daily.     fluticasone  (FLONASE ) 50 MCG/ACT nasal spray Place 2 sprays into both nostrils daily as needed for allergies. 16 g 11   hydrocortisone  cream 1 % Apply 1 Application topically 2 (two) times daily. 30 g 0   INGREZZA  80 MG capsule Take one capsule by mouth daily 30 capsule 1   leflunomide  (ARAVA ) 20 MG tablet TAKE 1 TABLET BY MOUTH EVERY DAY 90 tablet 0   LINZESS  72 MCG capsule TAKE 1 CAPSULE BY MOUTH DAILY BEFORE BREAKFAST. 90 capsule 3   metoprolol  succinate (TOPROL -XL) 50 MG 24 hr tablet TAKE ONE TABLET BY MOUTH DAILY WITH A MEAL. 90 tablet 0   ondansetron  (ZOFRAN ) 4 MG tablet Take 1 tablet (4 mg total) by mouth 4 (four) times daily as needed for  nausea. 90 tablet 1   oxyCODONE  (ROXICODONE ) 5 MG immediate release tablet Take 1 tablet (5 mg total) by mouth every 6 (six) hours as needed for severe pain (pain score 7-10). 15 tablet 0   oxyCODONE -acetaminophen  (PERCOCET) 5-325 MG tablet Take 1 tablet by mouth every 8 (eight) hours as needed for severe pain (pain score 7-10). Ok to fill after 01/01/23 60 tablet 0   oxyCODONE -acetaminophen  (PERCOCET) 5-325 MG tablet Take 1 tablet by mouth every 8 (eight) hours as needed for severe pain (pain score 7-10). Ok to fill after 01/01/23 60 tablet 0   SEMAGLUTIDE ,0.25 OR 0.5MG /DOS, [place] Inject  40 Units into the skin once a week.     tiZANidine  (ZANAFLEX ) 4 MG capsule Take 4 mg by mouth 3 (three) times daily as needed for muscle spasms.     triamcinolone  cream (KENALOG ) 0.1 % Apply 1 Application topically 2 (two) times daily as needed.     valsartan  (DIOVAN ) 320 MG tablet TAKE 1 TABLET BY MOUTH EVERY DAY 90 tablet 3   No current facility-administered medications on file prior to visit.   Allergies  Allergen Reactions   Latex Rash    And Mouth sores Other reaction(s): rash Other reaction(s): rash Other reaction(s): rash   Amlodipine  Besy-Benazepril Hcl Other (See Comments)    COUGH   Amoxicillin-Pot Clavulanate Diarrhea and Nausea And Vomiting    Other reaction(s): sick on stomach Other reaction(s): sick on stomach   Bextra [Valdecoxib] Diarrhea and Nausea And Vomiting    REFLUX   Chlorhexidine      rash Other reaction(s): rash all over body Other reaction(s): rash all over body   Hydrochlorothiazide      Low sodium   Lipitor [Atorvastatin Calcium] Other (See Comments)    MYALGIAS   Spironolactone      hyponatremia   Sulfa Antibiotics Nausea And Vomiting    CRAMPING Other reaction(s): stomach hurts Other reaction(s): stomach hurts   Zocor [Simvastatin] Other (See Comments)    MYALGIA   Social History   Socioeconomic History   Marital status: Married    Spouse name: Daryl   Number of  children: Not on file   Years of education: Not on file   Highest education level: Bachelor's degree (e.g., BA, AB, BS)  Occupational History    Comment: retired  Tobacco Use   Smoking status: Former    Current packs/day: 0.00    Average packs/day: 0.5 packs/day for 10.0 years (5.0 ttl pk-yrs)    Types: Cigarettes    Start date: 03/06/1985    Quit date: 03/07/1995    Years since quitting: 28.2    Passive exposure: Never   Smokeless tobacco: Never  Vaping Use   Vaping status: Never Used  Substance and Sexual Activity   Alcohol use: Yes    Comment: rare   Drug use: Never   Sexual activity: Not on file  Other Topics Concern   Not on file  Social History Narrative   Lives with husband   Social Drivers of Health   Financial Resource Strain: Low Risk  (03/15/2023)   Overall Financial Resource Strain (CARDIA)    Difficulty of Paying Living Expenses: Not hard at all  Food Insecurity: No Food Insecurity (03/15/2023)   Hunger Vital Sign    Worried About Running Out of Food in the Last Year: Never true    Ran Out of Food in the Last Year: Never true  Transportation Needs: No Transportation Needs (03/15/2023)   PRAPARE - Administrator, Civil Service (Medical): No    Lack of Transportation (Non-Medical): No  Physical Activity: Inactive (03/15/2023)   Exercise Vital Sign    Days of Exercise per Week: 0 days    Minutes of Exercise per Session: 0 min  Stress: Stress Concern Present (03/15/2023)   [employer] of Occupational Health - Occupational Stress Questionnaire    Feeling of Stress : To some extent  Social Connections: Socially Integrated (03/15/2023)   Social Connection and Isolation Panel [NHANES]    Frequency of Communication with Friends and Family: Twice a week    Frequency of Social Gatherings with Friends and Family: Once  a week    Attends Religious Services: More than 4 times per year    Active Member of Clubs or Organizations: No    Attends Tax Inspector Meetings: More than 4 times per year    Marital Status: Married  Catering Manager Violence: Not At Risk (08/13/2022)   Humiliation, Afraid, Rape, and Kick questionnaire    Fear of Current or Ex-Partner: No    Emotionally Abused: No    Physically Abused: No    Sexually Abused: No    Review of Systems  All other systems reviewed and are negative.      Objective:   Physical Exam Vitals reviewed.  Constitutional:      General: She is not in acute distress.    Appearance: Normal appearance. She is normal weight. She is not ill-appearing or toxic-appearing.  HENT:     Right Ear: Tympanic membrane and ear canal normal.     Left Ear: Tympanic membrane and ear canal normal.     Mouth/Throat:     Pharynx: Oropharynx is clear. No oropharyngeal exudate or posterior oropharyngeal erythema.  Cardiovascular:     Rate and Rhythm: Normal rate and regular rhythm.     Heart sounds: Normal heart sounds. No murmur heard.    No friction rub. No gallop.  Pulmonary:     Effort: Pulmonary effort is normal. No respiratory distress.     Breath sounds: Normal breath sounds. No wheezing, rhonchi or rales.  Abdominal:     General: Abdomen is flat. Bowel sounds are normal. There is no distension.     Palpations: Abdomen is soft.     Tenderness: There is no abdominal tenderness.  Musculoskeletal:     Right lower leg: No edema.     Left lower leg: No edema.  Neurological:     Mental Status: She is alert.          Assessment & Plan:  Anemia, unspecified type - Plan: CBC with Differential/Platelet, COMPLETE METABOLIC PANEL WITH GFR, Fecal occult blood, imunochemical, Vitamin B12, Iron, TIBC and Ferritin Panel Confirm CBC.  If CBC is confirming anemia, check ferritin and iron studies along with B12.  If vitamin levels are normal, check stool for blood.

## 2023-06-08 LAB — IRON,TIBC AND FERRITIN PANEL
%SAT: 26 % (ref 16–45)
Ferritin: 41 ng/mL (ref 16–288)
Iron: 81 ug/dL (ref 45–160)
TIBC: 308 ug/dL (ref 250–450)

## 2023-06-08 LAB — COMPLETE METABOLIC PANEL WITH GFR
AG Ratio: 1.7 (calc) (ref 1.0–2.5)
ALT: 8 U/L (ref 6–29)
AST: 11 U/L (ref 10–35)
Albumin: 3.7 g/dL (ref 3.6–5.1)
Alkaline phosphatase (APISO): 120 U/L (ref 37–153)
BUN: 8 mg/dL (ref 7–25)
CO2: 25 mmol/L (ref 20–32)
Calcium: 9.5 mg/dL (ref 8.6–10.4)
Chloride: 102 mmol/L (ref 98–110)
Creat: 0.92 mg/dL (ref 0.60–1.00)
Globulin: 2.2 g/dL (ref 1.9–3.7)
Glucose, Bld: 132 mg/dL — ABNORMAL HIGH (ref 65–99)
Potassium: 3.6 mmol/L (ref 3.5–5.3)
Sodium: 137 mmol/L (ref 135–146)
Total Bilirubin: 0.4 mg/dL (ref 0.2–1.2)
Total Protein: 5.9 g/dL — ABNORMAL LOW (ref 6.1–8.1)
eGFR: 66 mL/min/{1.73_m2} (ref >=60)

## 2023-06-08 LAB — CBC WITH DIFFERENTIAL/PLATELET
Absolute Lymphocytes: 1226 {cells}/uL (ref 850–3900)
Absolute Monocytes: 421 {cells}/uL (ref 200–950)
Basophils Absolute: 67 {cells}/uL (ref 0–200)
Basophils Relative: 1.1 %
Eosinophils Absolute: 67 {cells}/uL (ref 15–500)
Eosinophils Relative: 1.1 %
HCT: 35.8 % (ref 35.0–45.0)
Hemoglobin: 11.4 g/dL — ABNORMAL LOW (ref 11.7–15.5)
MCH: 30.8 pg (ref 27.0–33.0)
MCHC: 31.8 g/dL — ABNORMAL LOW (ref 32.0–36.0)
MCV: 96.8 fL (ref 80.0–100.0)
MPV: 10.3 fL (ref 7.5–12.5)
Monocytes Relative: 6.9 %
Neutro Abs: 4319 {cells}/uL (ref 1500–7800)
Neutrophils Relative %: 70.8 %
Platelets: 298 10*3/uL (ref 140–400)
RBC: 3.7 10*6/uL — ABNORMAL LOW (ref 3.80–5.10)
RDW: 13.2 % (ref 11.0–15.0)
Total Lymphocyte: 20.1 %
WBC: 6.1 10*3/uL (ref 3.8–10.8)

## 2023-06-08 LAB — VITAMIN B12: Vitamin B-12: 391 pg/mL (ref 200–1100)

## 2023-06-09 ENCOUNTER — Other Ambulatory Visit: Payer: Self-pay | Admitting: Family Medicine

## 2023-06-10 ENCOUNTER — Other Ambulatory Visit: Payer: Self-pay | Admitting: Family Medicine

## 2023-06-10 MED ORDER — OXYCODONE HCL 5 MG PO TABS: 5.0000 mg | ORAL_TABLET | Freq: Four times a day (QID) | ORAL | 0 refills | Status: DC | PRN

## 2023-06-10 NOTE — Telephone Encounter (Signed)
Copied from CRM [id number]. Topic: Clinical - Medication Refill >> Jun 10, 2023 10:51 AM Antony Haste wrote: Most Recent Primary Care Visit:  Provider: Lynnea Ferrier T  Department: [facility]  Visit Type: OFFICE VISIT  Date: 06/07/2023  Medication: oxyCODONE (ROXICODONE) 5 MG immediate release tablet  Has the patient contacted their pharmacy? No (Agent: If no, request that the patient contact the pharmacy for the refill. If patient does not wish to contact the pharmacy document the reason why and proceed with request.) (Agent: If yes, when and what did the pharmacy advise?)  Is this the correct pharmacy for this prescription? Yes If no, delete pharmacy and type the correct one.  This is the patient's preferred pharmacy:  [id number] [place], [place] - [address] [place] [address] Phone: [phone] Fax: [phone]   Has the prescription been filled recently? Yes, it was sent to the wrong pharmacy, [facility]  Is the patient out of the medication? Yes  Has the patient been seen for an appointment in the last year OR does the patient have an upcoming appointment? Yes  Can we respond through MyChart? No  Agent: Please be advised that Rx refills may take up to 3 business days. We ask that you follow-up with your pharmacy.

## 2023-06-11 ENCOUNTER — Ambulatory Visit (HOSPITAL_BASED_OUTPATIENT_CLINIC_OR_DEPARTMENT_OTHER): Payer: Medicare Other | Admitting: Internal Medicine

## 2023-06-11 DIAGNOSIS — R0609 Other forms of dyspnea: Secondary | ICD-10-CM | POA: Diagnosis not present

## 2023-06-11 LAB — PULMONARY FUNCTION TEST
DL/VA % pred: 117 %
DL/VA: 4.88 ml/min/mmHg/L
DLCO cor % pred: 81 %
DLCO cor: 15.09 ml/min/mmHg
DLCO unc % pred: 75 %
DLCO unc: 14.07 ml/min/mmHg
FEF 25-75 Post: 1.28 L/s
FEF 25-75 Pre: 1.12 L/s
FEF2575-%Change-Post: 14 %
FEF2575-%Pred-Post: 75 %
FEF2575-%Pred-Pre: 65 %
FEV1-%Change-Post: 3 %
FEV1-%Pred-Post: 62 %
FEV1-%Pred-Pre: 60 %
FEV1-Post: 1.29 L
FEV1-Pre: 1.25 L
FEV1FVC-%Change-Post: -9 %
FEV1FVC-%Pred-Pre: 103 %
FEV6-%Change-Post: 9 %
FEV6-%Pred-Post: 66 %
FEV6-%Pred-Pre: 61 %
FEV6-Post: 1.76 L
FEV6-Pre: 1.61 L
FEV6FVC-%Change-Post: -3 %
FEV6FVC-%Pred-Post: 101 %
FEV6FVC-%Pred-Pre: 105 %
FVC-%Change-Post: 13 %
FVC-%Pred-Post: 66 %
FVC-%Pred-Pre: 58 %
FVC-Post: 1.83 L
FVC-Pre: 1.61 L
Post FEV1/FVC ratio: 71 %
Post FEV6/FVC ratio: 96 %
Pre FEV1/FVC ratio: 78 %
Pre FEV6/FVC Ratio: 100 %
RV % pred: 92 %
RV: 2.02 L
TLC % pred: 68 %
TLC: 3.37 L

## 2023-06-11 NOTE — Patient Instructions (Signed)
Full PFT Performed Today  

## 2023-06-11 NOTE — Progress Notes (Signed)
Full PFT Performed Today  

## 2023-06-15 ENCOUNTER — Other Ambulatory Visit: Payer: Self-pay | Admitting: Family Medicine

## 2023-06-15 ENCOUNTER — Telehealth: Payer: Self-pay

## 2023-06-15 MED ORDER — OXYCODONE-ACETAMINOPHEN 5-325 MG PO TABS: 1.0000 | ORAL_TABLET | Freq: Three times a day (TID) | ORAL | 0 refills | Status: DC | PRN

## 2023-06-15 NOTE — Telephone Encounter (Signed)
Pt's husband came by to advise that the wrong pain medication was sent in for the patient. Pt uses the Oxycodone 5/325 not the Roxicodone 5 mg immediate release that was sent in. Can a new rx be sent in for her? Thanks.

## 2023-06-16 LAB — FECAL GLOBIN BY IMMUNOCHEMISTRY
FECAL GLOBIN RESULT:: NOT DETECTED
MICRO NUMBER:: 15981482
SPECIMEN QUALITY:: ADEQUATE

## 2023-06-16 LAB — HOUSE ACCOUNT TRACKING

## 2023-06-20 ENCOUNTER — Other Ambulatory Visit: Payer: Self-pay | Admitting: Family Medicine

## 2023-06-24 NOTE — Progress Notes (Signed)
Janet Peterson, female    DOB: [birth year]    MRN: [id number]   Brief patient profile:  63  yowf  stopped smoking 1996  s resp symptoms at wt=150 referred to pulmonary clinic in [facility]  06/02/2023 by Dr Tanya Nones for doe progressive x 10 y    Took allergy shots in [place] x 20 y > helped a little sinus problems but then had  2 nasal surgeries since then with less sinus infections since   Played pickleball prior to T spine surgery in 2013 and downhill after that with ex tol      History of Present Illness  06/02/2023  Pulmonary/ 1st office eval/ Janet Peterson / [facility] re doe @ wt 190  Chief Complaint  Patient presents with   Establish Care   Shortness of Breath  Dyspnea:  last did ex bike April 2024 x 5 minutes only at "resistance set at 3"   Cough: none  Sleep: flat bed with one pillow s resp cc  SABA use: no better with albuterol  02: none Rec  An exercise bike or recumbent bike would be the best option to build up your endurance with goal of 30 minutes or exercise daily where you are aware your are short of breath but never out of breath. If you do this a minimum of 3 x weekly for a month and not noting improvement then we can schedule a CPST in [place] at the heart clinic there.   - PFT's  06/11/23  FEV1 1.29 (62 % ) ratio 0.71  p 3% % improvement from saba p nothing  prior to study with DLCO  10.07 (75% %)   and FV curve not concave on exp  and ERV not done at wt 188      06/25/2023  f/u ov/[place] office/Tanique Matney re: doe  maint on no resp rx   Chief Complaint  Patient presents with   Follow-up    Follow up from PFT  Dyspnea:  kitchen to car is a struggle  due to balance > sob  Cough: none  Sleeping: flat bed one pillow s  resp cc  SABA use: none  02: none      No obvious day to day or daytime variability or assoc excess/ purulent sputum or mucus plugs or hemoptysis or cp or chest tightness, subjective wheeze or overt sinus or hb symptoms.    Also denies any obvious  fluctuation of symptoms with weather or environmental changes or other aggravating or alleviating factors except as outlined above   No unusual exposure hx or h/o childhood pna/ asthma or knowledge of premature birth.  Current Allergies, Complete Past Medical History, Past Surgical History, Family History, and Social History were reviewed in [employer] record.  ROS  The following are not active complaints unless bolded Hoarseness, sore throat, dysphagia, dental problems, itching, sneezing,  nasal congestion or discharge of excess mucus or purulent secretions, ear ache,   fever, chills, sweats, unintended wt loss or wt gain, classically pleuritic or exertional cp,  orthopnea pnd or arm/hand swelling  or leg swelling, presyncope, palpitations, abdominal pain, anorexia, nausea, vomiting, diarrhea  or change in bowel habits or change in bladder habits, change in stools or change in urine, dysuria, hematuria,  rash, arthralgias, visual complaints, headache, numbness, weakness or ataxia or problems with walking or coordination,  change in mood or  memory.        Current Meds  Medication Sig   albuterol (VENTOLIN  HFA) 108 (90 Base) MCG/ACT inhaler TAKE 2 PUFFS BY MOUTH EVERY 6 HOURS AS NEEDED FOR WHEEZE OR SHORTNESS OF BREATH   amLODipine (NORVASC) 5 MG tablet Take 1 tablet (5 mg total) by mouth daily.   buPROPion (WELLBUTRIN XL) 300 MG 24 hr tablet Take 300 mg by mouth every morning.   ciprofloxacin (CILOXAN) 0.3 % ophthalmic solution Place 1 drop into the left eye.   clonazePAM (KLONOPIN) 1 MG tablet TAKE 1 TABLET BY MOUTH EVERY DAY AS NEEDED FOR ANXIETY (Patient taking differently: Take 1 mg by mouth daily as needed for anxiety.)   cyclobenzaprine (FLEXERIL) 10 MG tablet Take 1 tablet (10 mg total) by mouth 3 (three) times daily as needed for muscle spasms.   dexlansoprazole (DEXILANT) 60 MG capsule TAKE 1 CAPSULE BY MOUTH EVERY DAY   diazepam (VALIUM) 5 MG tablet Take 5 mg by  mouth. Used for testing like MRI's   doxepin (SINEQUAN) 50 MG capsule Take 50 mg by mouth.   Evolocumab (REPATHA SURECLICK) 140 MG/ML SOAJ Inject 140 mg into the skin every 14 (fourteen) days.   ezetimibe (ZETIA) 10 MG tablet TAKE 1 TABLET BY MOUTH EVERY DAY   FLUoxetine (PROZAC) 20 MG capsule Take 20 mg by mouth 3 (three) times daily.   fluticasone (FLONASE) 50 MCG/ACT nasal spray Place 2 sprays into both nostrils daily as needed for allergies.   hydrocortisone cream 1 % Apply 1 Application topically 2 (two) times daily.   INGREZZA 80 MG capsule Take one capsule by mouth daily   leflunomide (ARAVA) 20 MG tablet TAKE 1 TABLET BY MOUTH EVERY DAY   LINZESS 72 MCG capsule TAKE 1 CAPSULE BY MOUTH DAILY BEFORE BREAKFAST.   metoprolol succinate (TOPROL-XL) 50 MG 24 hr tablet TAKE ONE TABLET BY MOUTH DAILY WITH A MEAL.   ondansetron (ZOFRAN) 4 MG tablet Take 1 tablet (4 mg total) by mouth 4 (four) times daily as needed for nausea.   oxyCODONE (ROXICODONE) 5 MG immediate release tablet Take 1 tablet (5 mg total) by mouth every 6 (six) hours as needed for severe pain (pain score 7-10).   oxyCODONE-acetaminophen (PERCOCET) 5-325 MG tablet Take 1 tablet by mouth every 8 (eight) hours as needed for severe pain (pain score 7-10). Ok to fill after 01/01/23   oxyCODONE-acetaminophen (PERCOCET) 5-325 MG tablet Take 1 tablet by mouth every 8 (eight) hours as needed for severe pain (pain score 7-10). Ok to fill after 01/01/23   SEMAGLUTIDE,0.25 OR 0.5MG /DOS, [place] Inject 40 Units into the skin once a week.   tiZANidine (ZANAFLEX) 4 MG capsule Take 4 mg by mouth 3 (three) times daily as needed for muscle spasms.   triamcinolone cream (KENALOG) 0.1 % Apply 1 Application topically 2 (two) times daily as needed.   valsartan (DIOVAN) 320 MG tablet TAKE 1 TABLET BY MOUTH EVERY DAY          Past Medical History:  Diagnosis Date   Ankylosing spondylitis (HCC)    Anxiety and depression    Depression    Fibromyalgia     GERD (gastroesophageal reflux disease)    Hiatal hernia    per patient, dx by GI    History of basal cell carcinoma excision    FACE, 1992  &  1996   History of hiatal hernia    History of thrush    Hyperlipidemia    Hypertension    Insomnia    Left breast mass    OCD (obsessive compulsive disorder)    Osteopenia  PONV (postoperative nausea and vomiting)    Psoriatic arthritis (HCC)    PVC (premature ventricular contraction)    Rheumatoid arthritis (HCC)       Objective:     Wt Readings from Last 3 Encounters:  06/25/23 188 lb 12.8 oz (85.6 kg)  06/11/23 189 lb 9.6 oz (86 kg)  06/07/23 190 lb 3.2 oz (86.3 kg)      Vital signs reviewed  06/25/2023  - Note at rest 02 sats  94% on RA   General appearance:    somber amb wf nad      HEENT : Oropharynx  clear / edentulous        NECK :  without  apparent JVD/ palpable Nodes/TM    LUNGS: no acc muscle use,  Nl contour chest which is clear to A and P bilaterally without cough on insp or exp maneuvers   CV:  RRR  no s3 or murmur or increase in P2, and no edema   ABD:  soft and nontender   MS:  gait moderate pace but a little bit awkward and unsteady   ext warm without deformities Or obvious joint restrictions  calf tenderness, cyanosis or clubbing    SKIN: warm and dry without lesions    NEURO:  alert, approp, nl sensorium with  no motor or cerebellar deficits apparent.  edentulous                 Assessment

## 2023-06-25 ENCOUNTER — Encounter: Payer: Self-pay | Admitting: Internal Medicine

## 2023-06-25 ENCOUNTER — Ambulatory Visit: Payer: Medicare Other | Admitting: Internal Medicine

## 2023-06-25 VITALS — BP 104/78 | HR 68 | Ht 63.5 in | Wt 188.8 lb

## 2023-06-25 DIAGNOSIS — R0609 Other forms of dyspnea: Secondary | ICD-10-CM | POA: Diagnosis not present

## 2023-06-25 NOTE — Assessment & Plan Note (Signed)
Stopped smoking 1996 at wt 160  - onset p back surgery 2013 - CTa  10/08/22 > neg PE  - Echo 11/16/22  Mild LVH with Mild LAE and bnp = 137 - 06/02/2023  @ wt 190  Walked on RA  x  1  lap(s) =  approx 150  ft  @ slow pace, stopped due to sob = fatigue with lowest 02 sats 95% - PFT's  06/11/23  FEV1 1.29 (62 % ) ratio 0.71  p 3% % improvement from saba p nothing  prior to study with DLCO  10.07 (75% %)   and FV curve not concave on exp  and ERV not done at wt 188     Reviewed pfts with pt and husband c/w obesity induced moderate restrictive patter and demonstarted effect of obesity on diaphragm function in supine position.  No contraindication to knee surgery but it will be a challenge to mobilize her post op with risk of dvt/pe/atelectasis all covered in detail today  Pulmonary f/u can be prn          Each maintenance medication was reviewed in detail including emphasizing most importantly the difference between maintenance and prns and under what circumstances the prns are to be triggered using an action plan format where appropriate.  Total time for H and P, chart review, counseling,  and generating customized AVS unique to this office visit / same day charting = 33 min summary final f/u

## 2023-06-25 NOTE — Patient Instructions (Signed)
Try the Bed blocks building up to 8 inches tolerated to help your diaphragm rest at night   You are cleared for knee replacement >> Good luck with surgery and physical therapy!   Pulmonary follow up is as needed

## 2023-06-28 DIAGNOSIS — M6281 Muscle weakness (generalized): Secondary | ICD-10-CM | POA: Insufficient documentation

## 2023-07-05 ENCOUNTER — Other Ambulatory Visit: Payer: Self-pay

## 2023-07-05 ENCOUNTER — Telehealth: Payer: Self-pay

## 2023-07-05 DIAGNOSIS — R0609 Other forms of dyspnea: Secondary | ICD-10-CM

## 2023-07-05 NOTE — Telephone Encounter (Signed)
 Copied from CRM [id number]. Topic: Referral - Question >> Jul 05, 2023  1:32 PM Carlatta H wrote: Reason for CRM: Patient would like referred to [facility] Pulmonologist//She did not like the previous pulmonologist

## 2023-07-07 ENCOUNTER — Other Ambulatory Visit: Payer: Self-pay | Admitting: Family Medicine

## 2023-07-07 ENCOUNTER — Telehealth: Payer: Self-pay

## 2023-07-07 NOTE — Telephone Encounter (Signed)
Type Date User Summary Attachment  General 07/07/2023 10:54 AM Suzanna Obey: Referral message -  Note: ----- Message ----- From: Trula Slade Sent: 07/07/2023  10:54 AM EST To: Donita Brooks, MD; Darral Dash, LPN Subject: Pulmonary referral                                ----- Message ----- From: Pedro Earls Sent: 07/06/2023   8:11 AM EST To: Valora Piccolo,    Dr Maple Hudson does not see pulmonary patients. He only sees sleep patients. Dr Sherene Sires is the only doctor in the [place] office. Unfortunately, Dorri would have to come to [place] nor would she be able to see Dr Maple Hudson. Can you relay this to her?   Thanks,  Engineer, technical sales . Type Date User Summary Attachment  General 07/07/2023 10:56 AM Trula Slade Forward the message from Pulmonology to provider and CMA. -  Note: Forward the message from Pulmonology to provider and CMA. . Type Date User Summary Attachment  General 07/06/2023  8:11 AM Rae Lips: Referral message -  Note: ----- Message ----- From: Pedro Earls Sent: 07/06/2023   8:11 AM EST To: Valora Piccolo,    Dr Maple Hudson does not see pulmonary patients. He only sees sleep patients. Dr Sherene Sires is the only doctor in the [place] office. Unfortunately, Nicolasa would have to come to [place] nor would she be able to see Dr Maple Hudson. Can you relay this to her?   Thanks,  Muffy-Referral Coordinator

## 2023-07-07 NOTE — Telephone Encounter (Signed)
Requested medication (s) are due for refill today: expired medication date  Requested medication (s) are on the active medication list: yes   Last refill:  05/27/22 #90 3 refills   Future visit scheduled: no   Notes to clinic:  expired medication date. Do you want to renew Rx?     Requested Prescriptions  Pending Prescriptions Disp Refills   valsartan (DIOVAN) 320 MG tablet [Pharmacy Med Name: VALSARTAN 320 MG TABLET] 90 tablet 3    Sig: TAKE 1 TABLET BY MOUTH EVERY DAY     Cardiovascular:  Angiotensin Receptor Blockers Failed - 07/07/2023  2:56 PM      Failed - Valid encounter within last 6 months    Recent Outpatient Visits           1 year ago Hyponatremia   [facility] Donita Brooks, MD   1 year ago Bronchitis   [facility] Tanya Nones, [name], MD   1 year ago Bronchitis   [facility] Donita Brooks, MD   2 years ago Anemia, unspecified type   [facility] Donita Brooks, MD   2 years ago Acute non-recurrent pansinusitis   [facility] Valentino Nose, NP       Future Appointments             In 2 months [name] [facility] - A Dept Of [place]. [facility]            Passed - Cr in normal range and within 180 days    Creat  Date Value Ref Range Status  06/07/2023 0.92 0.60 - 1.00 mg/dL Final         Passed - K in normal range and within 180 days    Potassium  Date Value Ref Range Status  06/07/2023 3.6 3.5 - 5.3 mmol/L Final         Passed - Patient is not pregnant      Passed - Last BP in normal range    BP Readings from Last 1 Encounters:  06/25/23 104/78

## 2023-07-13 ENCOUNTER — Other Ambulatory Visit: Payer: Self-pay | Admitting: Family Medicine

## 2023-07-13 NOTE — Telephone Encounter (Signed)
Copied from CRM [id number]. Topic: Clinical - Medication Refill >> Jul 13, 2023  2:21 PM Herbert Seta B wrote: Most Recent Primary Care Visit:  Provider: Lynnea Ferrier T  Department: [facility]  Visit Type: OFFICE VISIT  Date: 06/07/2023  Medication: valsartan (DIOVAN) 320 MG tablet  Has the patient contacted their pharmacy? Yes-did not receive (Agent: If no, request that the patient contact the pharmacy for the refill. If patient does not wish to contact the pharmacy document the reason why and proceed with request.) (Agent: If yes, when and what did the pharmacy advise?)  Is this the correct pharmacy for this prescription? yes If no, delete pharmacy and type the correct one.  This is the patient's preferred pharmacy:  [id number] [place], [place] - [address] [place] [address] Phone: [phone] Fax: [phone]   Has the prescription been filled recently? no  Is the patient out of the medication? yes  Has the patient been seen for an appointment in the last year OR does the patient have an upcoming appointment? yes  Can we respond through MyChart? no  Agent: Please be advised that Rx refills may take up to 3 business days. We ask that you follow-up with your pharmacy.

## 2023-07-19 ENCOUNTER — Other Ambulatory Visit: Payer: Self-pay | Admitting: Family Medicine

## 2023-07-19 ENCOUNTER — Telehealth: Payer: Self-pay

## 2023-07-19 NOTE — Telephone Encounter (Signed)
 Copied from CRM [id number]. Topic: Referral - Request for Referral >> Jul 19, 2023 11:44 AM Prudencio Pair wrote: Did the patient discuss referral with their provider in the last year? Yes (If No - schedule appointment) (If Yes - send message)  Appointment offered? No  Type of order/referral and detailed reason for visit: Patient states she seen Dr. Sherene Sires but doesn't want to see him anymore & now wants a second opinion with a different pulmonologist.   Preference of office, provider, location: [facility] [place] area  If referral order, have you been seen by this specialty before? Yes (If Yes, this issue or another issue? When? Where?  Can we respond through MyChart? Yes

## 2023-07-19 NOTE — Telephone Encounter (Signed)
 Copied from CRM [id number]. Topic: Clinical - Medication Refill >> Jul 19, 2023 11:46 AM Prudencio Pair wrote: Most Recent Primary Care Visit:  Provider: Lynnea Ferrier T  Department: [facility]  Visit Type: OFFICE VISIT  Date: 06/07/2023  Medication: valsartan (DIOVAN) 320 MG tablet   Has the patient contacted their pharmacy? Yes, pharmacy still waiting to hear from Dr. Tanya Nones (Agent: If no, request that the patient contact the pharmacy for the refill. If patient does not wish to contact the pharmacy document the reason why and proceed with request.) (Agent: If yes, when and what did the pharmacy advise?)  Is this the correct pharmacy for this prescription? Yes If no, delete pharmacy and type the correct one.  This is the patient's preferred pharmacy:  [id number] [place], [place] - [address] [place] [address] Phone: [phone] Fax: [phone]   Has the prescription been filled recently? Yes  Is the patient out of the medication? Yes  Has the patient been seen for an appointment in the last year OR does the patient have an upcoming appointment? Yes  Can we respond through MyChart? Yes  Agent: Please be advised that Rx refills may take up to 3 business days. We ask that you follow-up with your pharmacy.

## 2023-07-20 ENCOUNTER — Ambulatory Visit: Payer: Medicare Other | Admitting: Family Medicine

## 2023-07-20 ENCOUNTER — Ambulatory Visit: Payer: Medicare Other | Admitting: Pulmonary Disease

## 2023-07-20 ENCOUNTER — Other Ambulatory Visit: Payer: Self-pay

## 2023-07-20 ENCOUNTER — Encounter: Payer: Self-pay | Admitting: Pulmonary Disease

## 2023-07-20 VITALS — BP 105/77 | HR 86 | Ht 63.0 in | Wt 190.0 lb

## 2023-07-20 DIAGNOSIS — Z87891 Personal history of nicotine dependence: Secondary | ICD-10-CM | POA: Diagnosis not present

## 2023-07-20 DIAGNOSIS — I10 Essential (primary) hypertension: Secondary | ICD-10-CM

## 2023-07-20 DIAGNOSIS — M419 Scoliosis, unspecified: Secondary | ICD-10-CM | POA: Diagnosis not present

## 2023-07-20 DIAGNOSIS — R0609 Other forms of dyspnea: Secondary | ICD-10-CM

## 2023-07-20 DIAGNOSIS — R600 Localized edema: Secondary | ICD-10-CM

## 2023-07-20 DIAGNOSIS — R0602 Shortness of breath: Secondary | ICD-10-CM

## 2023-07-20 LAB — CBC WITH DIFFERENTIAL/PLATELET
Basophils Absolute: 0.1 10*3/uL (ref 0.0–0.1)
Basophils Relative: 1.2 % (ref 0.0–3.0)
Eosinophils Absolute: 0.1 10*3/uL (ref 0.0–0.7)
Eosinophils Relative: 1.5 % (ref 0.0–5.0)
HCT: 36.1 % (ref 36.0–46.0)
Hemoglobin: 11.9 g/dL — ABNORMAL LOW (ref 12.0–15.0)
Lymphocytes Relative: 22.4 % (ref 12.0–46.0)
Lymphs Abs: 1.4 10*3/uL (ref 0.7–4.0)
MCHC: 33 g/dL (ref 30.0–36.0)
MCV: 96.6 fL (ref 78.0–100.0)
Monocytes Absolute: 0.5 10*3/uL (ref 0.1–1.0)
Monocytes Relative: 8.2 % (ref 3.0–12.0)
Neutro Abs: 4.1 10*3/uL (ref 1.4–7.7)
Neutrophils Relative %: 66.7 % (ref 43.0–77.0)
Platelets: 254 10*3/uL (ref 150.0–400.0)
RBC: 3.74 Mil/uL — ABNORMAL LOW (ref 3.87–5.11)
RDW: 14.6 % (ref 11.5–15.5)
WBC: 6.2 10*3/uL (ref 4.0–10.5)

## 2023-07-20 LAB — BRAIN NATRIURETIC PEPTIDE: Pro B Natriuretic peptide (BNP): 57 pg/mL (ref 0.0–100.0)

## 2023-07-20 MED ORDER — VALSARTAN 320 MG PO TABS: 320.0000 mg | ORAL_TABLET | Freq: Every day | ORAL | 3 refills | Status: DC

## 2023-07-20 MED ORDER — BREZTRI AEROSPHERE 160-9-4.8 MCG/ACT IN AERO: 2.0000 | INHALATION_SPRAY | Freq: Two times a day (BID) | RESPIRATORY_TRACT | 3 refills | Status: AC

## 2023-07-20 NOTE — Patient Instructions (Addendum)
 VISIT SUMMARY:  Today, we discussed your chronic shortness of breath, which has been worsening over the years. We reviewed your previous tests and symptoms, and we talked about possible causes and next steps for your treatment.  YOUR PLAN:  -CHRONIC SHORTNESS OF BREATH: Chronic shortness of breath means you have had difficulty breathing for a long time, which has been getting worse. This can be due to several reasons, including poor physical fitness, asthma, or heart problems. We will order a high-resolution CT scan of your chest, prescribe an inhaler to see if it helps with possible asthma, and check your blood for signs of heart failure and inflammation. We will also discuss with your primary care doctor about alternative medications for your leg swelling.  -ASTHMA: Asthma is a condition where your airways can become narrow and inflamed, making it hard to breathe. Although you haven't been diagnosed with asthma before, the inhaler seemed to help a bit. We will prescribe an inhaler to manage this and check your blood for any signs of inflammation.  -SCOLIOSIS AND SPINAL ISSUES: Scoliosis is a condition where your spine curves sideways, which can limit how much your lungs can expand and contribute to your breathing problems. We will consider this as part of your overall treatment plan for your shortness of breath.  -EDEMA: Edema is swelling caused by excess fluid trapped in your body's tissues, often in the legs. You had some relief with previous medications, but they were stopped due to low sodium levels. We will talk to your primary care doctor about other medications and check your sodium levels again.  -GENERAL HEALTH MAINTENANCE: We talked about the importance of exercise, weight loss, and staying active to help improve your breathing. Gradually increasing your physical activity and losing weight can make a big difference in your symptoms.  INSTRUCTIONS:  Please complete the lab tests today.  Follow up with your primary care doctor to discuss your sodium levels and alternative medications for your leg swelling. We will schedule a follow-up appointment after we get the results of your high-resolution CT chest scan.

## 2023-07-20 NOTE — Telephone Encounter (Signed)
 Duplicate request, last refill 07/20/23.  Requested Prescriptions  Pending Prescriptions Disp Refills   valsartan (DIOVAN) 320 MG tablet 90 tablet 3    Sig: Take 1 tablet (320 mg total) by mouth daily.     Cardiovascular:  Angiotensin Receptor Blockers Failed - 07/20/2023  2:36 PM      Failed - Valid encounter within last 6 months    Recent Outpatient Visits           1 year ago Hyponatremia   [name], [name], MD   1 year ago Bronchitis   [name], [name], MD   1 year ago Bronchitis   [name], [name], MD   2 years ago Anemia, unspecified type   [name], MD   2 years ago Acute non-recurrent pansinusitis   [name], NP       Future Appointments             In 1 month [name] [facility] Rhe[facility]ology - A Dept Of [place]. [facility]            Passed - Cr in normal range and within 180 days    Creat  Date Value Ref Range Status  06/07/2023 0.92 0.60 - 1.00 mg/dL Final         Passed - K in normal range and within 180 days    Potassium  Date Value Ref Range Status  06/07/2023 3.6 3.5 - 5.3 mmol/L Final         Passed - Patient is not pregnant      Passed - Last BP in normal range    BP Readings from Last 1 Encounters:  07/20/23 105/77

## 2023-07-20 NOTE — Progress Notes (Signed)
 Janet Peterson    [id number]    October 30, 1950  Primary Care Physician:Pickard, [name], MD  Referring Physician: Donita Brooks, MD [address] [place],  [place] [address]  Chief complaint:    HPI: 73 y.o. who  has a past medical history of Ankylosing spondylitis (HCC), Anxiety and depression, Depression, Fibromyalgia, GERD (gastroesophageal reflux disease), Hiatal hernia, History of basal cell carcinoma excision, History of hiatal hernia, History of thrush, Hyperlipidemia, Hypertension, Insomnia, Left breast mass, OCD (obsessive compulsive disorder), Osteopenia, PONV (postoperative nausea and vomiting), Psoriatic arthritis (HCC), PVC (premature ventricular contraction), and Rheumatoid arthritis (HCC).   Discussed the use of AI scribe software for clinical note transcription with the patient, who gave verbal consent to proceed.  Janet Peterson is a 73 year old female who presents with chronic shortness of breath. She is accompanied by her husband, Darryl. She was referred by a PA for evaluation of her chronic shortness of breath.  She has experienced shortness of breath for several years, which has progressively worsened. She becomes short of breath after walking approximately five to ten yards, such as from her car to her house, and also experiences difficulty breathing after showering, requiring her to rest her head on a ledge. She occasionally wakes up at night feeling breathless, which improves after sitting up and taking deep breaths. No cough, mucus congestion, or wheezing.  She underwent a pulmonary function test in December or January, which indicated low lung function. She was informed that her stomach pushes on her diaphragm, affecting her lungs. A CT scan of her chest in May and a CT of her heart in October were performed, both reported to be normal, although there was mention of atelectasis. She was previously given an albuterol inhaler by a PA, which she used  sporadically with minimal relief.  She has experienced swelling in her feet and legs, for which she was previously prescribed HCTZ and spironolactone, both of which helped with her breathing. However, her primary care doctor discontinued these medications due to low sodium levels. Her sodium levels have been as low as 127, but were normal in January. She is scheduled for lab work to recheck her sodium levels.  Her past medical history includes scoliosis and significant spinal issues, for which she has had surgery involving three rods and fifty-two screws. She has allergies and previously received allergy shots for twenty years, which she discontinued in 2000. She denies any family history of lung disease.  She has a history of smoking, having quit in 1996 after smoking intermittently for about 15-20 years, with a maximum of six cigarettes a day.   Pets: Used to have a dog Occupation: Retired Runner, broadcasting/film/video Exposures: No mold, hot tub, Financial controller.  No feather pillows or comforters Smoking history: 10-pack-year smoker.  Quit in 1996 Travel history: Previously lived in [place], no significant recent travel Relevant family history: No family history of lung disease   Outpatient Encounter Medications as of 07/20/2023  Medication Sig   albuterol (VENTOLIN HFA) 108 (90 Base) MCG/ACT inhaler TAKE 2 PUFFS BY MOUTH EVERY 6 HOURS AS NEEDED FOR WHEEZE OR SHORTNESS OF BREATH   amLODipine (NORVASC) 5 MG tablet Take 1 tablet (5 mg total) by mouth daily.   Budeson-Glycopyrrol-Formoterol (BREZTRI AEROSPHERE) 160-9-4.8 MCG/ACT AERO Inhale 2 puffs into the lungs in the morning and at bedtime.   buPROPion (WELLBUTRIN XL) 300 MG 24 hr tablet Take 300 mg by mouth every morning.  ciprofloxacin (CILOXAN) 0.3 % ophthalmic solution Place 1 drop into the left eye.   clonazePAM (KLONOPIN) 1 MG tablet TAKE 1 TABLET BY MOUTH EVERY DAY AS NEEDED FOR ANXIETY (Patient taking differently: Take 1 mg by mouth daily as needed for  anxiety.)   cyclobenzaprine (FLEXERIL) 10 MG tablet Take 1 tablet (10 mg total) by mouth 3 (three) times daily as needed for muscle spasms.   dexlansoprazole (DEXILANT) 60 MG capsule TAKE 1 CAPSULE BY MOUTH EVERY DAY   diazepam (VALIUM) 5 MG tablet Take 5 mg by mouth. Used for testing like MRI's   doxepin (SINEQUAN) 50 MG capsule Take 50 mg by mouth.   Evolocumab (REPATHA SURECLICK) 140 MG/ML SOAJ Inject 140 mg into the skin every 14 (fourteen) days.   ezetimibe (ZETIA) 10 MG tablet TAKE 1 TABLET BY MOUTH EVERY DAY   finasteride (PROSCAR) 5 MG tablet Take 1 tablet (5 mg total) by mouth daily.   FLUoxetine (PROZAC) 20 MG capsule Take 20 mg by mouth 3 (three) times daily.   fluticasone (FLONASE) 50 MCG/ACT nasal spray Place 2 sprays into both nostrils daily as needed for allergies.   hydrocortisone cream 1 % Apply 1 Application topically 2 (two) times daily.   INGREZZA 80 MG capsule Take one capsule by mouth daily   leflunomide (ARAVA) 20 MG tablet TAKE 1 TABLET BY MOUTH EVERY DAY   LINZESS 72 MCG capsule TAKE 1 CAPSULE BY MOUTH DAILY BEFORE BREAKFAST.   metoprolol succinate (TOPROL-XL) 50 MG 24 hr tablet TAKE ONE TABLET BY MOUTH DAILY WITH A MEAL.   ondansetron (ZOFRAN) 4 MG tablet Take 1 tablet (4 mg total) by mouth 4 (four) times daily as needed for nausea.   oxyCODONE (ROXICODONE) 5 MG immediate release tablet Take 1 tablet (5 mg total) by mouth every 6 (six) hours as needed for severe pain (pain score 7-10).   oxyCODONE-acetaminophen (PERCOCET) 5-325 MG tablet Take 1 tablet by mouth every 8 (eight) hours as needed for severe pain (pain score 7-10). Ok to fill after 01/01/23   oxyCODONE-acetaminophen (PERCOCET) 5-325 MG tablet Take 1 tablet by mouth every 8 (eight) hours as needed for severe pain (pain score 7-10). Ok to fill after 01/01/23   SEMAGLUTIDE,0.25 OR 0.5MG /DOS, [place] Inject 40 Units into the skin once a week.   tiZANidine (ZANAFLEX) 4 MG capsule Take 4 mg by mouth 3 (three) times daily as  needed for muscle spasms.   triamcinolone cream (KENALOG) 0.1 % Apply 1 Application topically 2 (two) times daily as needed.   valsartan (DIOVAN) 320 MG tablet Take 1 tablet (320 mg total) by mouth daily.   No facility-administered encounter medications on file as of 07/20/2023.    Allergies as of 07/20/2023 - Review Complete 07/20/2023  Allergen Reaction Noted   Latex Rash 03/03/2013   Amlodipine besy-benazepril hcl Other (See Comments) 12/23/2010   Amoxicillin-pot clavulanate Diarrhea and Nausea And Vomiting 12/23/2010   Bextra [valdecoxib] Diarrhea and Nausea And Vomiting 12/23/2010   Chlorhexidine  07/26/2018   Hydrochlorothiazide  12/16/2022   Lipitor [atorvastatin calcium] Other (See Comments) 12/23/2010   Spironolactone  12/16/2022   Sulfa antibiotics Nausea And Vomiting 12/23/2010   Zocor [simvastatin] Other (See Comments) 03/07/2015    Past Medical History:  Diagnosis Date   Ankylosing spondylitis (HCC)    Anxiety and depression    Depression    Fibromyalgia    GERD (gastroesophageal reflux disease)    Hiatal hernia    per patient, dx by GI    History  of basal cell carcinoma excision    FACE, 1992  &  1996   History of hiatal hernia    History of thrush    Hyperlipidemia    Hypertension    Insomnia    Left breast mass    OCD (obsessive compulsive disorder)    Osteopenia    PONV (postoperative nausea and vomiting)    Psoriatic arthritis (HCC)    PVC (premature ventricular contraction)    Rheumatoid arthritis (HCC)     Past Surgical History:  Procedure Laterality Date   BIOPSY  09/29/2019   Procedure: BIOPSY;  Surgeon: Kerin Salen, MD;  Location: [facility];  Service: Gastroenterology;;   BREAST EXCISIONAL BIOPSY Right    BREAST EXCISIONAL BIOPSY Left 05/04/2018   BREAST LUMPECTOMY WITH RADIOACTIVE SEED LOCALIZATION Left 05/04/2018   Procedure: LEFT BREAST LUMPECTOMY WITH RADIOACTIVE SEED LOCALIZATION AND LEFT BREAST NIPPLE BIOPSY;  Surgeon: Griselda Miner, MD;  Location: [facility];  Service: General;  Laterality: Left;   BREAST SURGERY  05/25/1973   lumpectomy-- benign   BUNIONECTOMY  05/25/1989   CATARACT EXTRACTION W/ INTRAOCULAR LENS  IMPLANT, BILATERAL  05/25/1998   CERVICAL FUSION  07/24/2011   C5 -- C7   CESAREAN SECTION  05/26/1983   COLONOSCOPY WITH PROPOFOL N/A 09/29/2019   Procedure: COLONOSCOPY WITH PROPOFOL;  Surgeon: Kerin Salen, MD;  Location: [facility];  Service: Gastroenterology;  Laterality: N/A;   DISTAL INTERPHALANGEAL JOINT FUSION Right 03/14/2015   Procedure: RIGHT LONG FINGER DISTAL INTERPHALANGEAL JOINT ARTHRODESIS;  Surgeon: Bradly Bienenstock, MD;  Location: [facility];  Service: Orthopedics;  Laterality: Right;   ESOPHAGOGASTRODUODENOSCOPY (EGD) WITH PROPOFOL N/A 09/29/2019   Procedure: ESOPHAGOGASTRODUODENOSCOPY (EGD) WITH PROPOFOL;  Surgeon: Kerin Salen, MD;  Location: [facility];  Service: Gastroenterology;  Laterality: N/A;   EYE SURGERY  05/25/1993   rk (laser surgery), semi cornea transplant, detacted retina,  fluid removal   HYSTEROSCOPY WITH D & C N/A 12/09/2020   Procedure: DILATATION AND CURETTAGE /HYSTEROSCOPY;  Surgeon: Steva Ready, DO;  Location: [facility];  Service: Gynecology;  Laterality: N/A;   KNEE ARTHROSCOPY Left 03/03/2004   POLYPECTOMY  09/29/2019   Procedure: POLYPECTOMY;  Surgeon: Kerin Salen, MD;  Location: [facility];  Service: Gastroenterology;;   POSTERIOR VITRECTOMY RIGHT EYE AND LASER   10/27/1999   SHOULDER SURGERY Right 05/26/1995   SKIN BIOPSY  05/2022   lower stomach   SPINAL FIXATION SURGERY W/ IMPLANT  2013 rod #1//  2014  rod 2   S1 -- T10  (rod #1)//   S1 -- T4 (rod #2)   THORACIC FUSION  03/13/2013   REMOVAL HARDWARE/  BONE GRAFT FUSION T10//  REVISION OF RODS   TOTAL KNEE ARTHROPLASTY Left 12/14/2005   UVULOPALATOPHARYNGOPLASTY  04/26/2006   w/  TONSILLECTOMY/  TURBINATE REDUCTIONS/  BILATERAL ANTERIOR ETHMOIDECTOMY     Family History  Problem Relation Age of Onset   Diabetes Mother    Heart disease Mother    Diabetes Father    Anuerysm Father    Diabetes Brother    Heart disease Brother    Diabetes Brother    Heart disease Brother    Breast cancer Neg Hx     Social History   Socioeconomic History   Marital status: Married    Spouse name: Daryl   Number of children: Not on file   Years of education: Not on file   Highest education level: Bachelor's degree (e.g., BA, AB, BS)  Occupational  History    Comment: retired  Tobacco Use   Smoking status: Former    Current packs/day: 0.00    Average packs/day: 0.5 packs/day for 10.0 years (5.0 ttl pk-yrs)    Types: Cigarettes    Start date: 03/06/1985    Quit date: 03/07/1995    Years since quitting: 28.3    Passive exposure: Never   Smokeless tobacco: Never  Vaping Use   Vaping status: Never Used  Substance and Sexual Activity   Alcohol use: Yes    Comment: rare   Drug use: Never   Sexual activity: Not on file  Other Topics Concern   Not on file  Social History Narrative   Lives with husband   Social Drivers of Health   Financial Resource Strain: Low Risk  (03/15/2023)   Overall Financial Resource Strain (CARDIA)    Difficulty of Paying Living Expenses: Not hard at all  Food Insecurity: No Food Insecurity (03/15/2023)   Hunger Vital Sign    Worried About Running Out of Food in the Last Year: Never true    Ran Out of Food in the Last Year: Never true  Transportation Needs: No Transportation Needs (03/15/2023)   PRAPARE - Administrator, Civil Service (Medical): No    Lack of Transportation (Non-Medical): No  Physical Activity: Inactive (03/15/2023)   Exercise Vital Sign    Days of Exercise per Week: 0 days    Minutes of Exercise per Session: 0 min  Stress: Stress Concern Present (03/15/2023)   [employer] of Occupational Health - Occupational Stress Questionnaire    Feeling of Stress : To some extent   Social Connections: Socially Integrated (03/15/2023)   Social Connection and Isolation Panel [NHANES]    Frequency of Communication with Friends and Family: Twice a week    Frequency of Social Gatherings with Friends and Family: Once a week    Attends Religious Services: More than 4 times per year    Active Member of [employer] or Organizations: No    Attends Engineer, structural: More than 4 times per year    Marital Status: Married  Catering manager Violence: Not At Risk (08/13/2022)   Humiliation, Afraid, Rape, and Kick questionnaire    Fear of Current or Ex-Partner: No    Emotionally Abused: No    Physically Abused: No    Sexually Abused: No    Review of systems: Review of Systems  Constitutional: Negative for fever and chills.  HENT: Negative.   Eyes: Negative for blurred vision.  Respiratory: as per HPI  Cardiovascular: Negative for chest pain and palpitations.  Gastrointestinal: Negative for vomiting, diarrhea, blood per rectum. Genitourinary: Negative for dysuria, urgency, frequency and hematuria.  Musculoskeletal: Negative for myalgias, back pain and joint pain.  Skin: Negative for itching and rash.  Neurological: Negative for dizziness, tremors, focal weakness, seizures and loss of consciousness.  Endo/Heme/Allergies: Negative for environmental allergies.  Psychiatric/Behavioral: Negative for depression, suicidal ideas and hallucinations.  All other systems reviewed and are negative.  Physical Exam: Blood pressure 105/77, pulse 86, height 5\' 3"  (1.6 m), weight 190 lb (86.2 kg), SpO2 97%. Gen:      No acute distress HEENT:  EOMI, sclera anicteric Neck:     No masses; no thyromegaly Lungs:    Clear to auscultation bilaterally; normal respiratory effort CV:         Regular rate and rhythm; no murmurs Abd:      + bowel sounds; soft, non-tender; no palpable masses,  no distension Ext:    2+ edema; adequate peripheral perfusion Skin:      Warm and dry; no rash Neuro:  alert and oriented x 3 Psych: normal mood and affect  Data Reviewed: Imaging: CTA 10/08/2022-no PE, subsegmental atelectasis at the base Cardiac CT 12/16/2022-visualized lungs appear clear except for subsegmental atelectasis  PFTs:  Labs:  Assessment and Plan Chronic Shortness of Breath Chronic dyspnea for several years, worsening over time. Symptoms include dyspnea on exertion (5-10 yards walking), post-shower dyspnea, and occasional nocturnal dyspnea. No cough, mucus congestion, or wheezing. Previous PFT indicated low lung volumes, possibly due to abdominal pressure on the diaphragm and scoliosis and bronchodilator response. CT chest in May and CT heart in July 2024 showed no significant abnormalities, except for mild atelectasis. Differential diagnosis includes deconditioning, asthma, and possible heart failure. Discussed that dyspnea can result from multiple factors including deconditioning, asthma, and heart failure. Explained that deconditioning is common and can be managed with increased physical activity and weight loss. Discussed the potential benefits and risks of using an inhaler for asthma and the need for further evaluation with a high-resolution CT scan.  - Order high-resolution CT chest - Prescribe inhaler for possible asthma - Check BNP and blood counts for heart failure and inflammation - Discuss alternative diuretics with primary care for edema management  Asthma Possible mild asthma suggested by slight improvement with albuterol inhaler and increased lung function post-inhaler. No history of asthma diagnosis but has a history of allergies. Discussed the potential benefits of using an inhaler and the need to monitor for any side effects. - Prescribe inhaler for asthma management - Check blood counts for inflammation  Scoliosis and Spinal Issues Scoliosis and multiple spinal surgeries with rods and screws. Possible contribution to restricted lung volumes and dyspnea due to  limited rib cage expansion. Discussed that scoliosis and spinal issues may restrict lung expansion, contributing to dyspnea. - Manage as part of overall treatment plan for dyspnea  Edema Bilateral lower extremity edema, previously managed with HCTZ and spironolactone. Edema improved with spironolactone but discontinued due to hyponatremia (sodium 127 mmol/L). Discussed the potential benefits and risks of using alternative diuretics and the need to monitor sodium levels closely. - Discuss alternative diuretics with primary care - Check BNP  General Health Maintenance Discussed the importance of exercise, weight loss, and overall physical activity to improve deconditioning and manage dyspnea. Explained that gradual increase in physical activity and weight loss can significantly improve symptoms. - Encourage gradual increase in physical activity - Recommend weight loss strategies  Follow-up - Order lab tests today - Follow-up with primary care for sodium levels and diuretic management - Schedule follow-up appointment after high-resolution CT chest results.   Recommendations: CBC, IgE, BNP Breztri inhaler High-res CT  Chilton Greathouse MD [facility] 07/20/2023, 2:31 PM  CC: Donita Brooks, MD

## 2023-07-22 ENCOUNTER — Ambulatory Visit: Payer: Medicare Other | Admitting: Family Medicine

## 2023-07-22 ENCOUNTER — Encounter: Payer: Self-pay | Admitting: Family Medicine

## 2023-07-22 VITALS — HR 94 | Temp 98.2°F | Ht 63.0 in | Wt 189.0 lb

## 2023-07-22 DIAGNOSIS — D509 Iron deficiency anemia, unspecified: Secondary | ICD-10-CM

## 2023-07-22 LAB — IGE: IgE (Immunoglobulin E), Serum: 32 kU/L (ref <=114)

## 2023-07-22 MED ORDER — OXYCODONE-ACETAMINOPHEN 5-325 MG PO TABS: 1.0000 | ORAL_TABLET | Freq: Three times a day (TID) | ORAL | 0 refills | Status: DC | PRN

## 2023-07-22 MED ORDER — CLOTRIMAZOLE 1 % EX CREA: 1.0000 | TOPICAL_CREAM | Freq: Two times a day (BID) | CUTANEOUS | 0 refills | Status: DC

## 2023-07-22 NOTE — Progress Notes (Signed)
 Subjective:    Patient ID: Janet Peterson, female    DOB: 1951/02/21, 73 y.o.   MRN: [id number]  HPI  2 issues today, the patient has a sore on her left foot.  She states that she had a keratosis removed with liquid nitrogen cryotherapy on the medial side of her left calcaneus.  This was performed by dermatologist 6 months ago.  Since that time she has been applying topical steroid creams to it almost on a daily basis.  She is also been using neomycin/Neosporin for the last week or so.  The skin in that area is erythematous.  The patch of erythematous is sharp, well-demarcated, circular, with scale.  It is 2 cm in diameter  Second issue, the patient has anemia with a hemoglobin of 11.4,54-month ago.  She been taking iron for the last month.  She is here today to recheck her hemoglobin Past Medical History:  Diagnosis Date   Ankylosing spondylitis (HCC)    Anxiety and depression    Depression    Fibromyalgia    GERD (gastroesophageal reflux disease)    Hiatal hernia    per patient, dx by GI    History of basal cell carcinoma excision    FACE, 1992  &  1996   History of hiatal hernia    History of thrush    Hyperlipidemia    Hypertension    Insomnia    Left breast mass    OCD (obsessive compulsive disorder)    Osteopenia    PONV (postoperative nausea and vomiting)    Psoriatic arthritis (HCC)    PVC (premature ventricular contraction)    Rheumatoid arthritis (HCC)    Past Surgical History:  Procedure Laterality Date   BIOPSY  09/29/2019   Procedure: BIOPSY;  Surgeon: Kerin Salen, MD;  Location: [facility];  Service: Gastroenterology;;   BREAST EXCISIONAL BIOPSY Right    BREAST EXCISIONAL BIOPSY Left 05/04/2018   BREAST LUMPECTOMY WITH RADIOACTIVE SEED LOCALIZATION Left 05/04/2018   Procedure: LEFT BREAST LUMPECTOMY WITH RADIOACTIVE SEED LOCALIZATION AND LEFT BREAST NIPPLE BIOPSY;  Surgeon: Griselda Miner, MD;  Location: [facility];  Service: General;  Laterality:  Left;   BREAST SURGERY  05/25/1973   lumpectomy-- benign   BUNIONECTOMY  05/25/1989   CATARACT EXTRACTION W/ INTRAOCULAR LENS  IMPLANT, BILATERAL  05/25/1998   CERVICAL FUSION  07/24/2011   C5 -- C7   CESAREAN SECTION  05/26/1983   COLONOSCOPY WITH PROPOFOL N/A 09/29/2019   Procedure: COLONOSCOPY WITH PROPOFOL;  Surgeon: Kerin Salen, MD;  Location: [facility];  Service: Gastroenterology;  Laterality: N/A;   DISTAL INTERPHALANGEAL JOINT FUSION Right 03/14/2015   Procedure: RIGHT LONG FINGER DISTAL INTERPHALANGEAL JOINT ARTHRODESIS;  Surgeon: Bradly Bienenstock, MD;  Location: [facility];  Service: Orthopedics;  Laterality: Right;   ESOPHAGOGASTRODUODENOSCOPY (EGD) WITH PROPOFOL N/A 09/29/2019   Procedure: ESOPHAGOGASTRODUODENOSCOPY (EGD) WITH PROPOFOL;  Surgeon: Kerin Salen, MD;  Location: [facility];  Service: Gastroenterology;  Laterality: N/A;   EYE SURGERY  05/25/1993   rk (laser surgery), semi cornea transplant, detacted retina,  fluid removal   HYSTEROSCOPY WITH D & C N/A 12/09/2020   Procedure: DILATATION AND CURETTAGE /HYSTEROSCOPY;  Surgeon: Steva Ready, DO;  Location: [facility];  Service: Gynecology;  Laterality: N/A;   KNEE ARTHROSCOPY Left 03/03/2004   POLYPECTOMY  09/29/2019   Procedure: POLYPECTOMY;  Surgeon: Kerin Salen, MD;  Location: [facility];  Service: Gastroenterology;;   POSTERIOR VITRECTOMY RIGHT EYE AND LASER  10/27/1999   SHOULDER SURGERY Right 05/26/1995   SKIN BIOPSY  05/2022   lower stomach   SPINAL FIXATION SURGERY W/ IMPLANT  2013 rod #1//  2014  rod 2   S1 -- T10  (rod #1)//   S1 -- T4 (rod #2)   THORACIC FUSION  03/13/2013   REMOVAL HARDWARE/  BONE GRAFT FUSION T10//  REVISION OF RODS   TOTAL KNEE ARTHROPLASTY Left 12/14/2005   UVULOPALATOPHARYNGOPLASTY  04/26/2006   w/  TONSILLECTOMY/  TURBINATE REDUCTIONS/  BILATERAL ANTERIOR ETHMOIDECTOMY   Current Outpatient Medications on File Prior to Visit  Medication Sig  Dispense Refill   albuterol (VENTOLIN HFA) 108 (90 Base) MCG/ACT inhaler TAKE 2 PUFFS BY MOUTH EVERY 6 HOURS AS NEEDED FOR WHEEZE OR SHORTNESS OF BREATH 8.5 each 1   amLODipine (NORVASC) 5 MG tablet Take 1 tablet (5 mg total) by mouth daily. 90 tablet 3   Budeson-Glycopyrrol-Formoterol (BREZTRI AEROSPHERE) 160-9-4.8 MCG/ACT AERO Inhale 2 puffs into the lungs in the morning and at bedtime. 3 each 3   buPROPion (WELLBUTRIN XL) 300 MG 24 hr tablet Take 300 mg by mouth every morning.  1   ciprofloxacin (CILOXAN) 0.3 % ophthalmic solution Place 1 drop into the left eye.     clonazePAM (KLONOPIN) 1 MG tablet TAKE 1 TABLET BY MOUTH EVERY DAY AS NEEDED FOR ANXIETY (Patient taking differently: Take 1 mg by mouth daily as needed for anxiety.) 30 tablet 1   cyclobenzaprine (FLEXERIL) 10 MG tablet Take 1 tablet (10 mg total) by mouth 3 (three) times daily as needed for muscle spasms. 30 tablet 0   dexlansoprazole (DEXILANT) 60 MG capsule TAKE 1 CAPSULE BY MOUTH EVERY DAY 90 capsule 0   diazepam (VALIUM) 5 MG tablet Take 5 mg by mouth. Used for testing like MRI's     doxepin (SINEQUAN) 50 MG capsule Take 50 mg by mouth.     Evolocumab (REPATHA SURECLICK) 140 MG/ML SOAJ Inject 140 mg into the skin every 14 (fourteen) days. 2 mL 11   ezetimibe (ZETIA) 10 MG tablet TAKE 1 TABLET BY MOUTH EVERY DAY 90 tablet 2   finasteride (PROSCAR) 5 MG tablet Take 1 tablet (5 mg total) by mouth daily. 90 tablet 1   FLUoxetine (PROZAC) 20 MG capsule Take 20 mg by mouth 3 (three) times daily.     fluticasone (FLONASE) 50 MCG/ACT nasal spray Place 2 sprays into both nostrils daily as needed for allergies. 16 g 11   hydrocortisone cream 1 % Apply 1 Application topically 2 (two) times daily. 30 g 0   INGREZZA 80 MG capsule Take one capsule by mouth daily 30 capsule 1   leflunomide (ARAVA) 20 MG tablet TAKE 1 TABLET BY MOUTH EVERY DAY 90 tablet 0   LINZESS 72 MCG capsule TAKE 1 CAPSULE BY MOUTH DAILY BEFORE BREAKFAST. 90 capsule 3    metoprolol succinate (TOPROL-XL) 50 MG 24 hr tablet TAKE ONE TABLET BY MOUTH DAILY WITH A MEAL. 90 tablet 0   ondansetron (ZOFRAN) 4 MG tablet Take 1 tablet (4 mg total) by mouth 4 (four) times daily as needed for nausea. 90 tablet 1   oxyCODONE (ROXICODONE) 5 MG immediate release tablet Take 1 tablet (5 mg total) by mouth every 6 (six) hours as needed for severe pain (pain score 7-10). 15 tablet 0   oxyCODONE-acetaminophen (PERCOCET) 5-325 MG tablet Take 1 tablet by mouth every 8 (eight) hours as needed for severe pain (pain score 7-10). Ok to fill after 01/01/23 60 tablet  0   SEMAGLUTIDE,0.25 OR 0.5MG /DOS, [place] Inject 40 Units into the skin once a week.     tiZANidine (ZANAFLEX) 4 MG capsule Take 4 mg by mouth 3 (three) times daily as needed for muscle spasms.     triamcinolone cream (KENALOG) 0.1 % Apply 1 Application topically 2 (two) times daily as needed.     valsartan (DIOVAN) 320 MG tablet Take 1 tablet (320 mg total) by mouth daily. 90 tablet 3   No current facility-administered medications on file prior to visit.   Allergies  Allergen Reactions   Latex Rash    And Mouth sores Other reaction(s): rash Other reaction(s): rash Other reaction(s): rash   Amlodipine Besy-Benazepril Hcl Other (See Comments)    COUGH   Amoxicillin-Pot Clavulanate Diarrhea and Nausea And Vomiting    Other reaction(s): sick on stomach Other reaction(s): sick on stomach   Bextra [Valdecoxib] Diarrhea and Nausea And Vomiting    REFLUX   Chlorhexidine     rash Other reaction(s): rash all over body Other reaction(s): rash all over body   Hydrochlorothiazide     Low sodium   Lipitor [Atorvastatin Calcium] Other (See Comments)    MYALGIAS   Spironolactone     hyponatremia   Sulfa Antibiotics Nausea And Vomiting    CRAMPING Other reaction(s): stomach hurts Other reaction(s): stomach hurts   Zocor [Simvastatin] Other (See Comments)    MYALGIA   Social History   Socioeconomic History   Marital status:  Married    Spouse name: Daryl   Number of children: Not on file   Years of education: Not on file   Highest education level: Bachelor's degree (e.g., BA, AB, BS)  Occupational History    Comment: retired  Tobacco Use   Smoking status: Former    Current packs/day: 0.00    Average packs/day: 0.5 packs/day for 10.0 years (5.0 ttl pk-yrs)    Types: Cigarettes    Start date: 03/06/1985    Quit date: 03/07/1995    Years since quitting: 28.3    Passive exposure: Never   Smokeless tobacco: Never  Vaping Use   Vaping status: Never Used  Substance and Sexual Activity   Alcohol use: Yes    Comment: rare   Drug use: Never   Sexual activity: Not on file  Other Topics Concern   Not on file  Social History Narrative   Lives with husband   Social Drivers of Health   Financial Resource Strain: Low Risk  (03/15/2023)   Overall Financial Resource Strain (CARDIA)    Difficulty of Paying Living Expenses: Not hard at all  Food Insecurity: No Food Insecurity (03/15/2023)   Hunger Vital Sign    Worried About Running Out of Food in the Last Year: Never true    Ran Out of Food in the Last Year: Never true  Transportation Needs: No Transportation Needs (03/15/2023)   PRAPARE - Administrator, Civil Service (Medical): No    Lack of Transportation (Non-Medical): No  Physical Activity: Inactive (03/15/2023)   Exercise Vital Sign    Days of Exercise per Week: 0 days    Minutes of Exercise per Session: 0 min  Stress: Stress Concern Present (03/15/2023)   [employer] of Occupational Health - Occupational Stress Questionnaire    Feeling of Stress : To some extent  Social Connections: Socially Integrated (03/15/2023)   Social Connection and Isolation Panel [NHANES]    Frequency of Communication with Friends and Family: Twice a week  Frequency of Social Gatherings with Friends and Family: Once a week    Attends Religious Services: More than 4 times per year    Active Member of  [employer] or Organizations: No    Attends Engineer, structural: More than 4 times per year    Marital Status: Married  Catering manager Violence: Not At Risk (08/13/2022)   Humiliation, Afraid, Rape, and Kick questionnaire    Fear of Current or Ex-Partner: No    Emotionally Abused: No    Physically Abused: No    Sexually Abused: No    Review of Systems  All other systems reviewed and are negative.      Objective:   Physical Exam Vitals reviewed.  Constitutional:      Appearance: Normal appearance.  Cardiovascular:     Rate and Rhythm: Normal rate and regular rhythm.     Heart sounds: Normal heart sounds.  Pulmonary:     Effort: Pulmonary effort is normal.     Breath sounds: Normal breath sounds.  Musculoskeletal:       Feet:  Skin:    Findings: Rash present. Rash is macular and scaling. Rash is not crusting, papular, pustular or vesicular.  Neurological:     Mental Status: She is alert.           Assessment & Plan:   Iron deficiency anemia, unspecified iron deficiency anemia type - Plan: CBC with Differential/Platelet I will repeat a CBC today to monitor her anemia now that she has been on iron for 1 month.  The rash on her left foot is uncertain.  She has been using steroid creams for more than 6 months without any benefit.  Therefore I do not feel that this is a steroid responsive dermatosis.  There is no evidence of a cellulitis.  Given the scale and the round circular nature I am concerned about a secondary infection with fungus.  I will treat this with Lotrimin cream twice daily for 14 days

## 2023-07-23 LAB — CBC WITH DIFFERENTIAL/PLATELET
Absolute Lymphocytes: 1387 {cells}/uL (ref 850–3900)
Absolute Monocytes: 569 {cells}/uL (ref 200–950)
Basophils Absolute: 58 {cells}/uL (ref 0–200)
Basophils Relative: 0.8 %
Eosinophils Absolute: 58 {cells}/uL (ref 15–500)
Eosinophils Relative: 0.8 %
HCT: 37.1 % (ref 35.0–45.0)
Hemoglobin: 11.8 g/dL (ref 11.7–15.5)
MCH: 30.9 pg (ref 27.0–33.0)
MCHC: 31.8 g/dL — ABNORMAL LOW (ref 32.0–36.0)
MCV: 97.1 fL (ref 80.0–100.0)
MPV: 11 fL (ref 7.5–12.5)
Monocytes Relative: 7.8 %
Neutro Abs: 5227 {cells}/uL (ref 1500–7800)
Neutrophils Relative %: 71.6 %
Platelets: 291 10*3/uL (ref 140–400)
RBC: 3.82 10*6/uL (ref 3.80–5.10)
RDW: 13.2 % (ref 11.0–15.0)
Total Lymphocyte: 19 %
WBC: 7.3 10*3/uL (ref 3.8–10.8)

## 2023-07-28 ENCOUNTER — Encounter: Payer: Self-pay | Admitting: Pulmonary Disease

## 2023-07-29 NOTE — H&P (Signed)
 Patient's anticipated LOS is less than 2 midnights, meeting these requirements: - Younger than 59 - Lives within 1 hour of care - Has a competent adult at home to recover with post-op recover - NO history of  - Chronic pain requiring opiods  - Diabetes  - Coronary Artery Disease  - Heart failure  - Heart attack  - Stroke  - DVT/VTE  - Cardiac arrhythmia  - Respiratory Failure/COPD  - Renal failure  - Anemia  - Advanced Liver disease     Janet Peterson is an 73 y.o. female.    Chief Complaint: right knee pain  HPI: Pt is a 73 y.o. female complaining of right knee pain for multiple years. Pain had continually increased since the beginning. X-rays in the clinic show end-stage arthritic changes of the right knee. Pt has tried various conservative treatments which have failed to alleviate their symptoms, including injections and therapy. Various options are discussed with the patient. Risks, benefits and expectations were discussed with the patient. Patient understand the risks, benefits and expectations and wishes to proceed with surgery.   PCP:  Donita Brooks, MD  D/C Plans: Home  PMH: Past Medical History:  Diagnosis Date   Ankylosing spondylitis (HCC)    Anxiety and depression    Depression    Fibromyalgia    GERD (gastroesophageal reflux disease)    Hiatal hernia    per patient, dx by GI    History of basal cell carcinoma excision    FACE, 1992  &  1996   History of hiatal hernia    History of thrush    Hyperlipidemia    Hypertension    Insomnia    Left breast mass    OCD (obsessive compulsive disorder)    Osteopenia    PONV (postoperative nausea and vomiting)    Psoriatic arthritis (HCC)    PVC (premature ventricular contraction)    Rheumatoid arthritis (HCC)     PSH: Past Surgical History:  Procedure Laterality Date   BIOPSY  09/29/2019   Procedure: BIOPSY;  Surgeon: Kerin Salen, MD;  Location: [facility];  Service: Gastroenterology;;   BREAST  EXCISIONAL BIOPSY Right    BREAST EXCISIONAL BIOPSY Left 05/04/2018   BREAST LUMPECTOMY WITH RADIOACTIVE SEED LOCALIZATION Left 05/04/2018   Procedure: LEFT BREAST LUMPECTOMY WITH RADIOACTIVE SEED LOCALIZATION AND LEFT BREAST NIPPLE BIOPSY;  Surgeon: Griselda Miner, MD;  Location: [facility];  Service: General;  Laterality: Left;   BREAST SURGERY  05/25/1973   lumpectomy-- benign   BUNIONECTOMY  05/25/1989   CATARACT EXTRACTION W/ INTRAOCULAR LENS  IMPLANT, BILATERAL  05/25/1998   CERVICAL FUSION  07/24/2011   C5 -- C7   CESAREAN SECTION  05/26/1983   COLONOSCOPY WITH PROPOFOL N/A 09/29/2019   Procedure: COLONOSCOPY WITH PROPOFOL;  Surgeon: Kerin Salen, MD;  Location: [facility];  Service: Gastroenterology;  Laterality: N/A;   DISTAL INTERPHALANGEAL JOINT FUSION Right 03/14/2015   Procedure: RIGHT LONG FINGER DISTAL INTERPHALANGEAL JOINT ARTHRODESIS;  Surgeon: Bradly Bienenstock, MD;  Location: [facility];  Service: Orthopedics;  Laterality: Right;   ESOPHAGOGASTRODUODENOSCOPY (EGD) WITH PROPOFOL N/A 09/29/2019   Procedure: ESOPHAGOGASTRODUODENOSCOPY (EGD) WITH PROPOFOL;  Surgeon: Kerin Salen, MD;  Location: [facility];  Service: Gastroenterology;  Laterality: N/A;   EYE SURGERY  05/25/1993   rk (laser surgery), semi cornea transplant, detacted retina,  fluid removal   HYSTEROSCOPY WITH D & C N/A 12/09/2020   Procedure: DILATATION AND CURETTAGE /HYSTEROSCOPY;  Surgeon: Steva Ready, DO;  Location: [facility];  Service: Gynecology;  Laterality: N/A;   KNEE ARTHROSCOPY Left 03/03/2004   POLYPECTOMY  09/29/2019   Procedure: POLYPECTOMY;  Surgeon: Kerin Salen, MD;  Location: [facility];  Service: Gastroenterology;;   POSTERIOR VITRECTOMY RIGHT EYE AND LASER   10/27/1999   SHOULDER SURGERY Right 05/26/1995   SKIN BIOPSY  05/2022   lower stomach   SPINAL FIXATION SURGERY W/ IMPLANT  2013 rod #1//  2014  rod 2   S1 -- T10  (rod #1)//   S1 -- T4  (rod #2)   THORACIC FUSION  03/13/2013   REMOVAL HARDWARE/  BONE GRAFT FUSION T10//  REVISION OF RODS   TOTAL KNEE ARTHROPLASTY Left 12/14/2005   UVULOPALATOPHARYNGOPLASTY  04/26/2006   w/  TONSILLECTOMY/  TURBINATE REDUCTIONS/  BILATERAL ANTERIOR ETHMOIDECTOMY    Social History:  reports that she quit smoking about 28 years ago. Her smoking use included cigarettes. She started smoking about 38 years ago. She has a 5 pack-year smoking history. She has never been exposed to tobacco smoke. She has never used smokeless tobacco. She reports current alcohol use. She reports that she does not use drugs. BMI: Estimated body mass index is 33.48 kg/m as calculated from the following:   Height as of 07/22/23: 5\' 3"  (1.6 m).   Weight as of 07/22/23: 85.7 kg.  Lab Results  Component Value Date   ALBUMIN 3.6 05/27/2023   Diabetes: Patient does not have a diagnosis of diabetes.     Smoking Status:      Allergies:  Allergies  Allergen Reactions   Latex Rash    And Mouth sores Other reaction(s): rash Other reaction(s): rash Other reaction(s): rash   Amlodipine Besy-Benazepril Hcl Other (See Comments)    COUGH   Amoxicillin-Pot Clavulanate Diarrhea and Nausea And Vomiting    Other reaction(s): sick on stomach Other reaction(s): sick on stomach   Bextra [Valdecoxib] Diarrhea and Nausea And Vomiting    REFLUX   Chlorhexidine     rash Other reaction(s): rash all over body Other reaction(s): rash all over body   Hydrochlorothiazide     Low sodium   Lipitor [Atorvastatin Calcium] Other (See Comments)    MYALGIAS   Spironolactone     hyponatremia   Sulfa Antibiotics Nausea And Vomiting    CRAMPING Other reaction(s): stomach hurts Other reaction(s): stomach hurts   Zocor [Simvastatin] Other (See Comments)    MYALGIA    Medications: No current facility-administered medications for this encounter.   Current Outpatient Medications  Medication Sig Dispense Refill   albuterol  (VENTOLIN HFA) 108 (90 Base) MCG/ACT inhaler TAKE 2 PUFFS BY MOUTH EVERY 6 HOURS AS NEEDED FOR WHEEZE OR SHORTNESS OF BREATH 8.5 each 1   amLODipine (NORVASC) 5 MG tablet Take 1 tablet (5 mg total) by mouth daily. 90 tablet 3   Budeson-Glycopyrrol-Formoterol (BREZTRI AEROSPHERE) 160-9-4.8 MCG/ACT AERO Inhale 2 puffs into the lungs in the morning and at bedtime. 3 each 3   buPROPion (WELLBUTRIN XL) 300 MG 24 hr tablet Take 300 mg by mouth every morning.  1   ciprofloxacin (CILOXAN) 0.3 % ophthalmic solution Place 1 drop into the left eye.     clonazePAM (KLONOPIN) 1 MG tablet TAKE 1 TABLET BY MOUTH EVERY DAY AS NEEDED FOR ANXIETY (Patient taking differently: Take 1 mg by mouth daily as needed for anxiety.) 30 tablet 1   clotrimazole (LOTRIMIN AF) 1 % cream Apply 1 Application topically 2 (two) times daily. 30 g 0  cyclobenzaprine (FLEXERIL) 10 MG tablet Take 1 tablet (10 mg total) by mouth 3 (three) times daily as needed for muscle spasms. 30 tablet 0   dexlansoprazole (DEXILANT) 60 MG capsule TAKE 1 CAPSULE BY MOUTH EVERY DAY 90 capsule 0   diazepam (VALIUM) 5 MG tablet Take 5 mg by mouth. Used for testing like MRI's     doxepin (SINEQUAN) 50 MG capsule Take 50 mg by mouth.     Evolocumab (REPATHA SURECLICK) 140 MG/ML SOAJ Inject 140 mg into the skin every 14 (fourteen) days. 2 mL 11   ezetimibe (ZETIA) 10 MG tablet TAKE 1 TABLET BY MOUTH EVERY DAY 90 tablet 2   finasteride (PROSCAR) 5 MG tablet Take 1 tablet (5 mg total) by mouth daily. 90 tablet 1   FLUoxetine (PROZAC) 20 MG capsule Take 20 mg by mouth 3 (three) times daily.     fluticasone (FLONASE) 50 MCG/ACT nasal spray Place 2 sprays into both nostrils daily as needed for allergies. 16 g 11   hydrocortisone cream 1 % Apply 1 Application topically 2 (two) times daily. 30 g 0   INGREZZA 80 MG capsule Take one capsule by mouth daily 30 capsule 1   leflunomide (ARAVA) 20 MG tablet TAKE 1 TABLET BY MOUTH EVERY DAY 90 tablet 0   LINZESS 72 MCG  capsule TAKE 1 CAPSULE BY MOUTH DAILY BEFORE BREAKFAST. 90 capsule 3   metoprolol succinate (TOPROL-XL) 50 MG 24 hr tablet TAKE ONE TABLET BY MOUTH DAILY WITH A MEAL. 90 tablet 0   ondansetron (ZOFRAN) 4 MG tablet Take 1 tablet (4 mg total) by mouth 4 (four) times daily as needed for nausea. 90 tablet 1   oxyCODONE (ROXICODONE) 5 MG immediate release tablet Take 1 tablet (5 mg total) by mouth every 6 (six) hours as needed for severe pain (pain score 7-10). 15 tablet 0   oxyCODONE-acetaminophen (PERCOCET) 5-325 MG tablet Take 1 tablet by mouth every 8 (eight) hours as needed for severe pain (pain score 7-10). Ok to fill after 01/01/23 60 tablet 0   oxyCODONE-acetaminophen (PERCOCET) 5-325 MG tablet Take 1 tablet by mouth every 8 (eight) hours as needed for severe pain (pain score 7-10). Ok to fill after 01/01/23 60 tablet 0   SEMAGLUTIDE,0.25 OR 0.5MG /DOS,  Inject 40 Units into the skin once a week.     tiZANidine (ZANAFLEX) 4 MG capsule Take 4 mg by mouth 3 (three) times daily as needed for muscle spasms.     triamcinolone cream (KENALOG) 0.1 % Apply 1 Application topically 2 (two) times daily as needed.     valsartan (DIOVAN) 320 MG tablet Take 1 tablet (320 mg total) by mouth daily. 90 tablet 3    No results found for this or any previous visit (from the past 48 hours). No results found.  ROS: Pain with rom of the right lower extremity  Physical Exam: Alert and oriented 73 y.o. female in no acute distress Cranial nerves 2-12 intact Cervical spine: full rom with no tenderness, nv intact distally Chest: active breath sounds bilaterally, no wheeze rhonchi or rales Heart: regular rate and rhythm, no murmur Abd: non tender non distended with active bowel sounds Hip is stable with rom  Right knee painful rom with crepitus Nv intact distally No rashes or edema  Antalgic gait  Assessment/Plan Assessment: right knee end stage osteoarthritis  Plan:  Patient will undergo a right total knee  by Dr. Ranell Patrick at [facility] Risks benefits and expectations were discussed with the patient. Patient  understand risks, benefits and expectations and wishes to proceed. Preoperative templating of the joint replacement has been completed, documented, and submitted to the Operating Room personnel in order to optimize intra-operative equipment management.   Alphonsa Overall PA-C, MPAS [facility] is now [employer] [address], [place], [place] [address] Phone: [phone] [email or web address] Facebook  [employer]

## 2023-08-04 ENCOUNTER — Telehealth: Payer: Self-pay

## 2023-08-04 ENCOUNTER — Other Ambulatory Visit: Payer: Self-pay | Admitting: Physician Assistant

## 2023-08-04 NOTE — Telephone Encounter (Signed)
 Last Fill: 03/29/2023  Labs: 07/22/2023 MCHC 31.8, 06/07/2023 Glucose 132, Total Protein 5.9  Next Visit: 09/09/2023  Last Visit: 04/12/2023  DX: Psoriatic arthritis   Current Dose per office note 04/12/2023: Arava 20 mg 1 tablet by mouth daily.   Okay to refill Arava ?

## 2023-08-04 NOTE — Telephone Encounter (Signed)
 Copied from CRM [id number]. Topic: Clinical - Medical Advice >> Aug 04, 2023  3:31 PM Janet Peterson wrote: Reason for CRM: Pt caled in because she stated Dr. Lynnea Ferrier wanted an update on her foot, she wanted to get the message that her foot is not getting any better and the red area is still there. Pt callback [phone]

## 2023-08-04 NOTE — Progress Notes (Addendum)
 Anesthesia Review:  PCP: Lynnea Ferrier LVO 07/22/23  Has appt on 08/10/23  Clearance - Media Tab dated 06/21/23 Cardiologist : NAHSer- LOV 04/26/23  Pulm- Mannam, LOV 07/20/23   PPM/ ICD: Device Orders: Rep Notified:  Chest x-ray : CT angio chest- 10/08/22  EKG : 09/29/22  and 08/09/23  CT Cors- 03/28/23 Echo : 11/16/22  Stress test: 2002  Cardiac Cath :  PFT- 06/11/23   Activity level: cannot do a flight of stairs without difficulty  Sleep Study/ CPAP : no longer has sleep apnea  Fasting Blood Sugar :      / Checks Blood Sugar -- times a day:    Blood Thinner/ Instructions /Last Dose: ASA / Instructions/ Last Dose :    07/22/23- cbc- normal  Labs done  on 08/09/23   Blood pressure in left arm was 153/99 at preop and in right arm was 166/108.  PT denies anyt chest pain, shortness of breath, dizziness, headache or blurred vision.  PT states "  I am  in  pain and my back is bothering me,.  I did not sleep well last nite. I have problems with blood pressure being elevated at times.  I have appt with Dr Tanya Nones on 08/10/23. "  PT has copy of blood pressure readings to take with her. Husband with pt at preop appt. Leticia Clas made aware and of EKG done on 08/09/23.  No new orders given.    Latex Allergy  CHG Allergy - PT was instructed to use Dial antibacterial soap.

## 2023-08-05 NOTE — Patient Instructions (Signed)
 SURGICAL WAITING ROOM VISITATION  Patients having surgery or a procedure may have no more than 2 support people in the waiting area - these visitors may rotate.    Children under the age of 71 must have an adult with them who is not the patient.  Due to an increase in RSV and influenza rates and associated hospitalizations, children ages 39 and under may not visit patients in [facility] hospitals.  Visitors with respiratory illnesses are discouraged from visiting and should remain at home.  If the patient needs to stay at the hospital during part of their recovery, the visitor guidelines for inpatient rooms apply. Pre-op nurse will coordinate an appropriate time for 1 support person to accompany patient in pre-op.  This support person may not rotate.    Please refer to the [facility] website for the visitor guidelines for Inpatients (after your surgery is over and you are in a regular room).       Your procedure is scheduled on:  08/20/2023    Report to [facility] Main Entrance    Report to admitting at  0615 AM   Call this number if you have problems the morning of surgery [phone]   Do not eat food :After Midnight.   After Midnight you may have the following liquids until _ 0545_____ AM  DAY OF SURGERY  Water Non-Citrus Juices (without pulp, NO RED-Apple, White grape, White cranberry) Black Coffee (NO MILK/CREAM OR CREAMERS, sugar ok)  Clear Tea (NO MILK/CREAM OR CREAMERS, sugar ok) regular and decaf                             Plain Jell-O (NO RED)                                           Fruit ices (not with fruit pulp, NO RED)                                     Popsicles (NO RED)                                                               Sports drinks like Gatorade (NO RED)                     The day of surgery:  Drink ONE (1) Pre-Surgery Clear Ensure or G2 at   0545AM ( have completed by )_  the morning of surgery. Drink in one sitting. Do not sip.   This drink was given to you during your hospital  pre-op appointment visit. Nothing else to drink after completing the  Pre-Surgery Clear Ensure or G2.          If you have questions, please contact your surgeon's office.       Oral Hygiene is also important to reduce your risk of infection.  Remember - BRUSH YOUR TEETH THE MORNING OF SURGERY WITH YOUR REGULAR TOOTHPASTE  DENTURES WILL BE REMOVED PRIOR TO SURGERY PLEASE DO NOT APPLY "Poly grip" OR ADHESIVES!!!   Do NOT smoke after Midnight   Stop all vitamins and herbal supplements 7 days before surgery.   Take these medicines the morning of surgery with A SIP OF WATER:  inhalers as usual and bring, amlodipine, wellbutrin, dexilant, toprol             Semaglutide- last dose on   DO NOT TAKE ANY ORAL DIABETIC MEDICATIONS DAY OF YOUR SURGERY  Bring CPAP mask and tubing day of surgery.                              You may not have any metal on your body including hair pins, jewelry, and body piercing             Do not wear make-up, lotions, powders, perfumes/cologne, or deodorant  Do not wear nail polish including gel and S&S, artificial/acrylic nails, or any other type of covering on natural nails including finger and toenails. If you have artificial nails, gel coating, etc. that needs to be removed by a nail salon please have this removed prior to surgery or surgery may need to be canceled/ delayed if the surgeon/ anesthesia feels like they are unable to be safely monitored.   Do not shave  48 hours prior to surgery.               Men may shave face and neck.   Do not bring valuables to the hospital. [facility] IS NOT             RESPONSIBLE   FOR VALUABLES.   Contacts, glasses, dentures or bridgework may not be worn into surgery.   Bring small overnight bag day of surgery.   DO NOT BRING YOUR HOME MEDICATIONS TO THE HOSPITAL. PHARMACY WILL DISPENSE MEDICATIONS LISTED ON YOUR  MEDICATION LIST TO YOU DURING YOUR ADMISSION IN THE HOSPITAL!    Patients discharged on the day of surgery will not be allowed to drive home.  Someone NEEDS to stay with you for the first 24 hours after anesthesia.   Special Instructions: Bring a copy of your healthcare power of attorney and living will documents the day of surgery if you haven't scanned them before.              Please read over the following fact sheets you were given: IF YOU HAVE QUESTIONS ABOUT YOUR PRE-OP INSTRUCTIONS PLEASE CALL [phone]   If you received a COVID test during your pre-op visit  it is requested that you wear a mask when out in public, stay away from anyone that may not be feeling well and notify your surgeon if you develop symptoms. If you test positive for Covid or have been in contact with anyone that has tested positive in the last 10 days please notify you surgeon.      Pre-operative 5 CHG Bath Instructions   You can play a key role in reducing the risk of infection after surgery. Your skin needs to be as free of germs as possible. You can reduce the number of germs on your skin by washing with CHG (chlorhexidine gluconate) soap before surgery. CHG is an antiseptic soap that kills germs and continues to kill germs even after washing.   DO NOT use if you have an  allergy to chlorhexidine/CHG or antibacterial soaps. If your skin becomes reddened or irritated, stop using the CHG and notify one of our RNs at [phone].   Please shower with the CHG soap starting 4 days before surgery using the following schedule:     Please keep in mind the following:  DO NOT shave, including legs and underarms, starting the day of your first shower.   You may shave your face at any point before/day of surgery.  Place clean sheets on your bed the day you start using CHG soap. Use a clean washcloth (not used since being washed) for each shower. DO NOT sleep with pets once you start using the CHG.   CHG Shower  Instructions:  If you choose to wash your hair and private area, wash first with your normal shampoo/soap.  After you use shampoo/soap, rinse your hair and body thoroughly to remove shampoo/soap residue.  Turn the water OFF and apply about 3 tablespoons (45 ml) of CHG soap to a CLEAN washcloth.  Apply CHG soap ONLY FROM YOUR NECK DOWN TO YOUR TOES (washing for 3-5 minutes)  DO NOT use CHG soap on face, private areas, open wounds, or sores.  Pay special attention to the area where your surgery is being performed.  If you are having back surgery, having someone wash your back for you may be helpful. Wait 2 minutes after CHG soap is applied, then you may rinse off the CHG soap.  Pat dry with a clean towel  Put on clean clothes/pajamas   If you choose to wear lotion, please use ONLY the CHG-compatible lotions on the back of this paper.     Additional instructions for the day of surgery: DO NOT APPLY any lotions, deodorants, cologne, or perfumes.   Put on clean/comfortable clothes.  Brush your teeth.  Ask your nurse before applying any prescription medications to the skin.      CHG Compatible Lotions   Aveeno Moisturizing lotion  Cetaphil Moisturizing Cream  Cetaphil Moisturizing Lotion  Clairol Herbal Essence Moisturizing Lotion, Dry Skin  Clairol Herbal Essence Moisturizing Lotion, Extra Dry Skin  Clairol Herbal Essence Moisturizing Lotion, Normal Skin  Curel Age Defying Therapeutic Moisturizing Lotion with Alpha Hydroxy  Curel Extreme Care Body Lotion  Curel Soothing Hands Moisturizing Hand Lotion  Curel Therapeutic Moisturizing Cream, Fragrance-Free  Curel Therapeutic Moisturizing Lotion, Fragrance-Free  Curel Therapeutic Moisturizing Lotion, Original Formula  Eucerin Daily Replenishing Lotion  Eucerin Dry Skin Therapy Plus Alpha Hydroxy Crme  Eucerin Dry Skin Therapy Plus Alpha Hydroxy Lotion  Eucerin Original Crme  Eucerin Original Lotion  Eucerin Plus Crme Eucerin Plus  Lotion  Eucerin TriLipid Replenishing Lotion  Keri Anti-Bacterial Hand Lotion  Keri Deep Conditioning Original Lotion Dry Skin Formula Softly Scented  Keri Deep Conditioning Original Lotion, Fragrance Free Sensitive Skin Formula  Keri Lotion Fast Absorbing Fragrance Free Sensitive Skin Formula  Keri Lotion Fast Absorbing Softly Scented Dry Skin Formula  Keri Original Lotion  Keri Skin Renewal Lotion Keri Silky Smooth Lotion  Keri Silky Smooth Sensitive Skin Lotion  Nivea Body Creamy Conditioning Oil  Nivea Body Extra Enriched Teacher, adult education Moisturizing Lotion Nivea Crme  Nivea Skin Firming Lotion  NutraDerm 30 Skin Lotion  NutraDerm Skin Lotion  NutraDerm Therapeutic Skin Cream  NutraDerm Therapeutic Skin Lotion  ProShield Protective Hand Cream  Provon moisturizing lotion

## 2023-08-09 ENCOUNTER — Encounter (HOSPITAL_COMMUNITY): Payer: Self-pay

## 2023-08-09 ENCOUNTER — Other Ambulatory Visit: Payer: Self-pay

## 2023-08-09 ENCOUNTER — Encounter (HOSPITAL_COMMUNITY)
Admission: RE | Admit: 2023-08-09 | Discharge: 2023-08-09 | Disposition: A | Discharge status: Home / Self Care | Payer: Medicare Other | Source: Ambulatory Visit | Attending: Orthopedic Surgery | Admitting: Orthopedic Surgery

## 2023-08-09 VITALS — BP 153/99 | HR 88 | Temp 98.3°F | Resp 16 | Ht 63.0 in | Wt 184.0 lb

## 2023-08-09 DIAGNOSIS — Z01818 Encounter for other preprocedural examination: Secondary | ICD-10-CM | POA: Diagnosis present

## 2023-08-09 HISTORY — DX: Anemia, unspecified: D64.9

## 2023-08-09 HISTORY — DX: Dyspnea, unspecified: R06.00

## 2023-08-09 LAB — BASIC METABOLIC PANEL
Anion gap: 10 (ref 5–15)
BUN: 11 mg/dL (ref 8–23)
CO2: 23 mmol/L (ref 22–32)
Calcium: 10.1 mg/dL (ref 8.9–10.3)
Chloride: 101 mmol/L (ref 98–111)
Creatinine, Ser: 0.84 mg/dL (ref 0.44–1.00)
GFR, Estimated: >60 mL/min (ref >60)
Glucose, Bld: 99 mg/dL (ref 70–99)
Potassium: 3.8 mmol/L (ref 3.5–5.1)
Sodium: 134 mmol/L — ABNORMAL LOW (ref 135–145)

## 2023-08-09 LAB — SURGICAL PCR SCREEN
MRSA, PCR: NEGATIVE
Staphylococcus aureus: NEGATIVE

## 2023-08-09 LAB — CBC
HCT: 38.9 % (ref 36.0–46.0)
Hemoglobin: 12.4 g/dL (ref 12.0–15.0)
MCH: 31.6 pg (ref 26.0–34.0)
MCHC: 31.9 g/dL (ref 30.0–36.0)
MCV: 99 fL (ref 80.0–100.0)
Platelets: 273 10*3/uL (ref 150–400)
RBC: 3.93 MIL/uL (ref 3.87–5.11)
RDW: 14 % (ref 11.5–15.5)
WBC: 7.4 10*3/uL (ref 4.0–10.5)
nRBC: 0 % (ref 0.0–0.2)

## 2023-08-10 ENCOUNTER — Encounter: Admitting: Family Medicine

## 2023-08-10 ENCOUNTER — Encounter: Payer: Self-pay | Admitting: Family Medicine

## 2023-08-10 NOTE — Progress Notes (Signed)
 Subjective:    Patient ID: Janet Peterson, female    DOB: [birth year], 73 y.o.   MRN: [id number]  HPI The patient has a sore on her left foot.  She states that she had a keratosis removed with liquid nitrogen cryotherapy on the medial side of her left calcaneus.  This was performed by dermatologist 6 months ago.  Since that time she has been applying topical steroid creams to it almost on a daily basis.  She is also been using neomycin/Neosporin for the last week or so.  The skin in that area is erythematous.  The patch of erythematous is sharp, well-demarcated, circular, with scale.  It is 2 cm in diameter   Past Medical History:  Diagnosis Date  . Anemia   . Ankylosing spondylitis (HCC)   . Anxiety and depression   . Cancer (HCC)    basal cell skin cancer  . Depression   . Dyspnea   . Fibromyalgia   . GERD (gastroesophageal reflux disease)   . Hiatal hernia    per patient, dx by GI   . History of basal cell carcinoma excision    FACE, 1992  &  1996  . History of hiatal hernia   . History of thrush   . Hyperlipidemia   . Hypertension   . Insomnia   . Left breast mass   . OCD (obsessive compulsive disorder)   . Osteopenia   . PONV (postoperative nausea and vomiting)   . Psoriatic arthritis (HCC)   . PVC (premature ventricular contraction)   . Rheumatoid arthritis [facility])    Past Surgical History:  Procedure Laterality Date  . BIOPSY  09/29/2019   Procedure: BIOPSY;  Surgeon: Kerin Salen, MD;  Location: [facility];  Service: Gastroenterology;;  . BREAST EXCISIONAL BIOPSY Right   . BREAST EXCISIONAL BIOPSY Left 05/04/2018  . BREAST LUMPECTOMY WITH RADIOACTIVE SEED LOCALIZATION Left 05/04/2018   Procedure: LEFT BREAST LUMPECTOMY WITH RADIOACTIVE SEED LOCALIZATION AND LEFT BREAST NIPPLE BIOPSY;  Surgeon: Griselda Miner, MD;  Location: [facility];  Service: General;  Laterality: Left;  . BREAST SURGERY  05/25/1973   lumpectomy-- benign  . BUNIONECTOMY  05/25/1989   . CATARACT EXTRACTION W/ INTRAOCULAR LENS  IMPLANT, BILATERAL  05/25/1998  . CERVICAL FUSION  07/24/2011   C5 -- C7  . CESAREAN SECTION  05/26/1983  . COLONOSCOPY WITH PROPOFOL N/A 09/29/2019   Procedure: COLONOSCOPY WITH PROPOFOL;  Surgeon: Kerin Salen, MD;  Location: [facility];  Service: Gastroenterology;  Laterality: N/A;  . DISTAL INTERPHALANGEAL JOINT FUSION Right 03/14/2015   Procedure: RIGHT LONG FINGER DISTAL INTERPHALANGEAL JOINT ARTHRODESIS;  Surgeon: Bradly Bienenstock, MD;  Location: [facility] [place];  Service: Orthopedics;  Laterality: Right;  . ESOPHAGOGASTRODUODENOSCOPY (EGD) WITH PROPOFOL N/A 09/29/2019   Procedure: ESOPHAGOGASTRODUODENOSCOPY (EGD) WITH PROPOFOL;  Surgeon: Kerin Salen, MD;  Location: [facility];  Service: Gastroenterology;  Laterality: N/A;  . EYE SURGERY  05/25/1993   rk (laser surgery), semi cornea transplant, detacted retina,  fluid removal  . HYSTEROSCOPY WITH D & C N/A 12/09/2020   Procedure: DILATATION AND CURETTAGE /HYSTEROSCOPY;  Surgeon: Steva Ready, DO;  Location: [facility];  Service: Gynecology;  Laterality: N/A;  . KNEE ARTHROSCOPY Left 03/03/2004  . POLYPECTOMY  09/29/2019   Procedure: POLYPECTOMY;  Surgeon: Kerin Salen, MD;  Location: [facility];  Service: Gastroenterology;;  . POSTERIOR VITRECTOMY RIGHT EYE AND LASER   10/27/1999  . right foot surgery     . SHOULDER SURGERY  Right 05/26/1995  . SKIN BIOPSY  05/2022   lower stomach  . SPINAL FIXATION SURGERY W/ IMPLANT  2013 rod #1//  2014  rod 2   S1 -- T10  (rod #1)//   S1 -- T4 (rod #2)  . THORACIC FUSION  03/13/2013   REMOVAL HARDWARE/  BONE GRAFT FUSION T10//  REVISION OF RODS  . TOTAL KNEE ARTHROPLASTY Left 12/14/2005  . UVULOPALATOPHARYNGOPLASTY  04/26/2006   w/  TONSILLECTOMY/  TURBINATE REDUCTIONS/  BILATERAL ANTERIOR ETHMOIDECTOMY   Current Outpatient Medications on File Prior to Visit  Medication Sig Dispense Refill  . albuterol (VENTOLIN HFA)  108 (90 Base) MCG/ACT inhaler TAKE 2 PUFFS BY MOUTH EVERY 6 HOURS AS NEEDED FOR WHEEZE OR SHORTNESS OF BREATH 8.5 each 1  . amLODipine (NORVASC) 5 MG tablet Take 1 tablet (5 mg total) by mouth daily. 90 tablet 3  . Budeson-Glycopyrrol-Formoterol (BREZTRI AEROSPHERE) 160-9-4.8 MCG/ACT AERO Inhale 2 puffs into the lungs in the morning and at bedtime. 3 each 3  . buPROPion (WELLBUTRIN XL) 300 MG 24 hr tablet Take 300 mg by mouth every morning.  1  . clonazePAM (KLONOPIN) 1 MG tablet TAKE 1 TABLET BY MOUTH EVERY DAY AS NEEDED FOR ANXIETY 30 tablet 1  . clotrimazole (LOTRIMIN AF) 1 % cream Apply 1 Application topically 2 (two) times daily. 30 g 0  . dexlansoprazole (DEXILANT) 60 MG capsule TAKE 1 CAPSULE BY MOUTH EVERY DAY 90 capsule 0  . diazepam (VALIUM) 5 MG tablet Take 5 mg by mouth as needed (Prior to MRI).    Marland Kitchen doxepin (SINEQUAN) 50 MG capsule Take 100 mg by mouth at bedtime.    . Evolocumab (REPATHA SURECLICK) 140 MG/ML SOAJ Inject 140 mg into the skin every 14 (fourteen) days. 2 mL 11  . ezetimibe (ZETIA) 10 MG tablet TAKE 1 TABLET BY MOUTH EVERY DAY 90 tablet 2  . FLUoxetine (PROZAC) 20 MG capsule Take 60 mg by mouth at bedtime.    . fluticasone (FLONASE) 50 MCG/ACT nasal spray Place 2 sprays into both nostrils daily as needed for allergies. 16 g 11  . INGREZZA 80 MG capsule Take one capsule by mouth daily (Patient taking differently: 80 mg daily.) 30 capsule 1  . leflunomide (ARAVA) 20 MG tablet TAKE 1 TABLET BY MOUTH EVERY DAY 90 tablet 0  . LINZESS 72 MCG capsule TAKE 1 CAPSULE BY MOUTH DAILY BEFORE BREAKFAST. (Patient taking differently: Take 72 mcg by mouth daily as needed (constipation).) 90 capsule 3  . metoprolol succinate (TOPROL-XL) 50 MG 24 hr tablet TAKE ONE TABLET BY MOUTH DAILY WITH A MEAL. 90 tablet 0  . ondansetron (ZOFRAN) 4 MG tablet Take 1 tablet (4 mg total) by mouth 4 (four) times daily as needed for nausea. 90 tablet 1  . oxyCODONE-acetaminophen (PERCOCET) 5-325 MG tablet  Take 1 tablet by mouth every 8 (eight) hours as needed for severe pain (pain score 7-10). Ok to fill after 01/01/23 60 tablet 0  . oxyCODONE-acetaminophen (PERCOCET) 5-325 MG tablet Take 1 tablet by mouth every 8 (eight) hours as needed for severe pain (pain score 7-10). Ok to fill after 01/01/23 60 tablet 0  . SEMAGLUTIDE,0.25 OR 0.5MG /DOS, [place] Inject 1 mg into the skin once a week.    . valsartan (DIOVAN) 320 MG tablet Take 1 tablet (320 mg total) by mouth daily. 90 tablet 3  . White Petrolatum-Mineral Oil (REFRESH P.M. OP) Place 1 drop into both eyes every 6 (six) hours as needed (dry eyes).  No current facility-administered medications on file prior to visit.   Allergies  Allergen Reactions  . Latex Rash    Mouth sores  . Amlodipine Besy-Benazepril Hcl Cough    No angioedema  . Amoxicillin-Pot Clavulanate Diarrhea and Nausea And Vomiting  . Bextra [Valdecoxib] Diarrhea and Nausea And Vomiting    Acid reflux  . Hydrochlorothiazide     Low sodium  . Lipitor [Atorvastatin Calcium] Other (See Comments)    MYALGIAS  . Spironolactone     Hyponatremia  . Sulfa Antibiotics Nausea And Vomiting    Cramping and stomach hurts  . Zocor [Simvastatin] Other (See Comments)    MYALGIA  . Chlorhexidine Rash    Rash all over body   Social History   Socioeconomic History  . Marital status: Married    Spouse name: Daryl  . Number of children: Not on file  . Years of education: Not on file  . Highest education level: Bachelor's degree (e.g., BA, AB, BS)  Occupational History    Comment: retired  Tobacco Use  . Smoking status: Former    Current packs/day: 0.00    Average packs/day: 0.5 packs/day for 10.0 years (5.0 ttl pk-yrs)    Types: Cigarettes    Start date: 03/06/1985    Quit date: 03/07/1995    Years since quitting: 28.4    Passive exposure: Never  . Smokeless tobacco: Never  Vaping Use  . Vaping status: Never Used  Substance and Sexual Activity  . Alcohol use: Yes    Comment:  rare  . Drug use: Never  . Sexual activity: Not on file  Other Topics Concern  . Not on file  Social History Narrative   Lives with husband   Social Drivers of Health   Financial Resource Strain: Low Risk  (08/09/2023)   Overall Financial Resource Strain (CARDIA)   . Difficulty of Paying Living Expenses: Not hard at all  Food Insecurity: No Food Insecurity (08/09/2023)   Hunger Vital Sign   . Worried About Programme researcher, broadcasting/film/video in the Last Year: Never true   . Ran Out of Food in the Last Year: Never true  Transportation Needs: No Transportation Needs (08/09/2023)   PRAPARE - Transportation   . Lack of Transportation (Medical): No   . Lack of Transportation (Non-Medical): No  Physical Activity: Inactive (08/09/2023)   Exercise Vital Sign   . Days of Exercise per Week: 0 days   . Minutes of Exercise per Session: 0 min  Stress: Stress Concern Present (08/09/2023)   [employer] of Occupational Health - Occupational Stress Questionnaire   . Feeling of Stress : To some extent  Social Connections: Socially Integrated (08/09/2023)   Social Connection and Isolation Panel [NHANES]   . Frequency of Communication with Friends and Family: Twice a week   . Frequency of Social Gatherings with Friends and Family: Once a week   . Attends Religious Services: More than 4 times per year   . Active Member of Clubs or Organizations: No   . Attends Banker Meetings: More than 4 times per year   . Marital Status: Married  Catering manager Violence: Not At Risk (08/13/2022)   Humiliation, Afraid, Rape, and Kick questionnaire   . Fear of Current or Ex-Partner: No   . Emotionally Abused: No   . Physically Abused: No   . Sexually Abused: No    Review of Systems  All other systems reviewed and are negative.      Objective:  Physical Exam Vitals reviewed.  Constitutional:      Appearance: Normal appearance.  Cardiovascular:     Rate and Rhythm: Normal rate and regular rhythm.      Heart sounds: Normal heart sounds.  Pulmonary:     Effort: Pulmonary effort is normal.     Breath sounds: Normal breath sounds.  Musculoskeletal:       Feet:  Skin:    Findings: Rash present. Rash is macular and scaling. Rash is not crusting, papular, pustular or vesicular.  Neurological:     Mental Status: She is alert.           Assessment & Plan:   No diagnosis found. I will repeat a CBC today to monitor her anemia now that she has been on iron for 1 month.  The rash on her left foot is uncertain.  She has been using steroid creams for more than 6 months without any benefit.  Therefore I do not feel that this is a steroid responsive dermatosis.  There is no evidence of a cellulitis.  Given the scale and the round circular nature I am concerned about a secondary infection with fungus.  I will treat this with Lotrimin cream twice daily for 14 days This encounter was created in error - please disregard.

## 2023-08-13 ENCOUNTER — Ambulatory Visit
Admission: RE | Admit: 2023-08-13 | Discharge: 2023-08-13 | Disposition: A | Discharge status: Home / Self Care | Payer: Medicare Other | Source: Ambulatory Visit | Attending: Pulmonary Disease | Admitting: Pulmonary Disease

## 2023-08-13 DIAGNOSIS — R0609 Other forms of dyspnea: Secondary | ICD-10-CM

## 2023-08-19 ENCOUNTER — Other Ambulatory Visit: Payer: Self-pay | Admitting: Family Medicine

## 2023-08-19 ENCOUNTER — Ambulatory Visit: Payer: Medicare Other | Admitting: *Deleted

## 2023-08-19 DIAGNOSIS — Z0000 Encounter for general adult medical examination without abnormal findings: Secondary | ICD-10-CM

## 2023-08-19 NOTE — Progress Notes (Signed)
 Anesthesia Chart Review   Case: [id number] Date/Time: 08/20/23 0835   Procedure: ARTHROPLASTY, KNEE, TOTAL (Right: Knee)   Anesthesia type: Choice   Pre-op diagnosis: Arthritis of right knee   Location: [facility]OM 07 / [facility]   Surgeons: Beverely Low, MD       DISCUSSION:73 y.o. former smoker with h/o PONV, HTN, RA, ankylosing spondylitis, PVCs, right knee OA scheduled for above procedure 08/20/2023 with Dr. Beverely Low.   Clearance received from PCP that states pt is cleared, low risk for planned procedure.   Pt with scoliosis, s/p multiple surgeries.  Thoracolumbar fusion to pelvis with hardware in place, ACDF as well.   Evaluated by cardiology in December, Echo with normal left ventricular systolic function, minimal valvular disease.  Coronary CTA with no significant obstructive CAD. Stable at cardio visit 04/26/23 and advised to continue follow up with PCP.   Pt follows with pulmonology with chronic dyspnea.  Last seen 07/20/2023. Previous PFT indicated low lung volumes, possibly due to abdominal pressure on the diaphragm and scoliosis and bronchodilator response. CT chest in May and CT heart in July 2024 showed no significant abnormalities, except for mild atelectasis. Differential diagnosis includes deconditioning, asthma, and possible heart failure. Discussed that dyspnea can result from multiple factors including deconditioning, asthma, and heart failure.    VS: BP (!) 153/99   Pulse 88   Temp 36.8 C (Oral)   Resp 16   Ht 5\' 3"  (1.6 m)   Wt 83.5 kg   SpO2 97%   BMI 32.59 kg/m   PROVIDERS: Donita Brooks, MD is PCP   Chilton Greathouse, MD is Pulmonologist  LABS: Labs reviewed: Acceptable for surgery. (all labs ordered are listed, but only abnormal results are displayed)  Labs Reviewed  BASIC METABOLIC PANEL - Abnormal; Notable for the following components:      Result Value   Sodium 134 (*)    All other components within normal limits  SURGICAL PCR SCREEN  CBC      IMAGES:   EKG:   CV: Echo 11/16/22  1. Left ventricular ejection fraction, by estimation, is 55 to 60%. The  left ventricle has normal function. The left ventricle has no regional  wall motion abnormalities. There is mild left ventricular hypertrophy.  Left ventricular diastolic parameters  are indeterminate.   2. Right ventricular systolic function is normal. The right ventricular  size is normal. Tricuspid regurgitation signal is inadequate for assessing  PA pressure.   3. Left atrial size was mildly dilated.   4. The mitral valve is degenerative. Trivial mitral valve regurgitation.  No evidence of mitral stenosis. Moderate mitral annular calcification.   5. The aortic valve is tricuspid. Aortic valve regurgitation is not  visualized. No aortic stenosis is present.   6. Aortic dilatation noted. There is dilatation of the ascending aorta,  measuring 41 mm.   7. The inferior vena cava is normal in size with greater than 50%  respiratory variability, suggesting right atrial pressure of 3 mmHg.  Past Medical History:  Diagnosis Date   Anemia    Ankylosing spondylitis (HCC)    Anxiety and depression    Cancer (HCC)    basal cell skin cancer   Depression    Dyspnea    Fibromyalgia    GERD (gastroesophageal reflux disease)    Hiatal hernia    per patient, dx by GI    History of basal cell carcinoma excision    FACE, 1992  &  1996  History of hiatal hernia    History of thrush    Hyperlipidemia    Hypertension    Insomnia    Left breast mass    OCD (obsessive compulsive disorder)    Osteopenia    PONV (postoperative nausea and vomiting)    Psoriatic arthritis (HCC)    PVC (premature ventricular contraction)    Rheumatoid arthritis (HCC)     Past Surgical History:  Procedure Laterality Date   BIOPSY  09/29/2019   Procedure: BIOPSY;  Surgeon: Kerin Salen, MD;  Location: [facility];  Service: Gastroenterology;;   BREAST EXCISIONAL BIOPSY Right    BREAST  EXCISIONAL BIOPSY Left 05/04/2018   BREAST LUMPECTOMY WITH RADIOACTIVE SEED LOCALIZATION Left 05/04/2018   Procedure: LEFT BREAST LUMPECTOMY WITH RADIOACTIVE SEED LOCALIZATION AND LEFT BREAST NIPPLE BIOPSY;  Surgeon: Griselda Miner, MD;  Location: [facility];  Service: General;  Laterality: Left;   BREAST SURGERY  05/25/1973   lumpectomy-- benign   BUNIONECTOMY  05/25/1989   CATARACT EXTRACTION W/ INTRAOCULAR LENS  IMPLANT, BILATERAL  05/25/1998   CERVICAL FUSION  07/24/2011   C5 -- C7   CESAREAN SECTION  05/26/1983   COLONOSCOPY WITH PROPOFOL N/A 09/29/2019   Procedure: COLONOSCOPY WITH PROPOFOL;  Surgeon: Kerin Salen, MD;  [facility]: [facility];  Service: Gastroenterology;  Laterality: N/A;   DISTAL INTERPHALANGEAL JOINT FUSION Right 03/14/2015   Procedure: RIGHT LONG FINGER DISTAL INTERPHALANGEAL JOINT ARTHRODESIS;  Surgeon: Bradly Bienenstock, MD;  Location: [facility];  Service: Orthopedics;  Laterality: Right;   ESOPHAGOGASTRODUODENOSCOPY (EGD) WITH PROPOFOL N/A 09/29/2019   Procedure: ESOPHAGOGASTRODUODENOSCOPY (EGD) WITH PROPOFOL;  Surgeon: Kerin Salen, MD;  Location[facility];  Service: Gastroenterology;  Laterality: N/A;   EYE SURGERY  05/25/1993   rk (laser surgery), semi cornea transplant, detacted retina,  fluid removal   HYSTEROSCOPY WITH D & C N/A 12/09/2020   Procedure: DILATATION AND CURETTAGE /HYSTEROSCOPY;  Surgeon: Steva Ready, DO;  Location: [facility];  Service: Gynecology;  Laterality: N/A;   KNEE ARTHROSCOPY Left 03/03/2004   POLYPECTOMY  09/29/2019   Procedure: POLYPECTOMY;  Surgeon: Kerin Salen, MD;  [facility];  Service: Gastroenterology;;   POSTERIOR VITRECTOMY RIGHT EYE AND LASER   10/27/1999   right foot surgery      SHOULDER SURGERY Right 05/26/1995   SKIN BIOPSY  05/2022   lower stomach   SPINAL FIXATION SURGERY W/ IMPLANT  2013 rod #1//  2014  rod 2   S1 -- T10  (rod #1)//   S1 -- T4 (rod #2)    THORACIC FUSION  03/13/2013   REMOVAL HARDWARE/  BONE GRAFT FUSION T10//  REVISION OF RODS   TOTAL KNEE ARTHROPLASTY Left 12/14/2005   UVULOPALATOPHARYNGOPLASTY  04/26/2006   w/  TONSILLECTOMY/  TURBINATE REDUCTIONS/  BILATERAL ANTERIOR ETHMOIDECTOMY    MEDICATIONS:  albuterol (VENTOLIN HFA) 108 (90 Base) MCG/ACT inhaler   amLODipine (NORVASC) 5 MG tablet   Budeson-Glycopyrrol-Formoterol (BREZTRI AEROSPHERE) 160-9-4.8 MCG/ACT AERO   buPROPion (WELLBUTRIN XL) 300 MG 24 hr tablet   clonazePAM (KLONOPIN) 1 MG tablet   clotrimazole (LOTRIMIN AF) 1 % cream   dexlansoprazole (DEXILANT) 60 MG capsule   diazepam (VALIUM) 5 MG tablet   doxepin (SINEQUAN) 50 MG capsule   Evolocumab (REPATHA SURECLICK) 140 MG/ML SOAJ   ezetimibe (ZETIA) 10 MG tablet   FLUoxetine (PROZAC) 20 MG capsule   fluticasone (FLONASE) 50 MCG/ACT nasal spray   INGREZZA 80 MG capsule   leflunomide (ARAVA) 20 MG tablet  LINZESS 72 MCG capsule   metoprolol succinate (TOPROL-XL) 50 MG 24 hr tablet   ondansetron (ZOFRAN) 4 MG tablet   oxyCODONE-acetaminophen (PERCOCET) 5-325 MG tablet   oxyCODONE-acetaminophen (PERCOCET) 5-325 MG tablet   SEMAGLUTIDE,0.25 OR 0.5MG /DOS, [place]   valsartan (DIOVAN) 320 MG tablet   White Petrolatum-Mineral Oil (REFRESH P.M. OP)   No current facility-administered medications for this encounter.   Jodell Cipro Ward, PA-C [facility] [phone]

## 2023-08-19 NOTE — Patient Instructions (Signed)
 Janet Peterson , Thank you for taking time to come for your Medicare Wellness Visit. I appreciate your ongoing commitment to your health goals. Please review the following plan we discussed and let me know if I can assist you in the future.   Screening recommendations/referrals: Colonoscopy: up to date Mammogram: up to date Bone Density: up to date Recommended yearly ophthalmology/optometry visit for glaucoma screening and checkup Recommended yearly dental visit for hygiene and checkup  Vaccinations: Influenza vaccine:  Pneumococcal vaccine:  Tdap vaccine:  Shingles vaccine:      Preventive Care 65 Years and Older, Female Preventive care refers to lifestyle choices and visits with your health care provider that can promote health and wellness. What does preventive care include? A yearly physical exam. This is also called an annual well check. Dental exams once or twice a year. Routine eye exams. Ask your health care provider how often you should have your eyes checked. Personal lifestyle choices, including: Daily care of your teeth and gums. Regular physical activity. Eating a healthy diet. Avoiding tobacco and drug use. Limiting alcohol use. Practicing safe sex. Taking low-dose aspirin every day. Taking vitamin and mineral supplements as recommended by your health care provider. What happens during an annual well check? The services and screenings done by your health care provider during your annual well check will depend on your age, overall health, lifestyle risk factors, and family history of disease. Counseling  Your health care provider may ask you questions about your: Alcohol use. Tobacco use. Drug use. Emotional well-being. Home and relationship well-being. Sexual activity. Eating habits. History of falls. Memory and ability to understand (cognition). Work and work Astronomer. Reproductive health. Screening  You may have the following tests or  measurements: Height, weight, and BMI. Blood pressure. Lipid and cholesterol levels. These may be checked every 5 years, or more frequently if you are over 4 years old. Skin check. Lung cancer screening. You may have this screening every year starting at age 35 if you have a 30-pack-year history of smoking and currently smoke or have quit within the past 15 years. Fecal occult blood test (FOBT) of the stool. You may have this test every year starting at age 58. Flexible sigmoidoscopy or colonoscopy. You may have a sigmoidoscopy every 5 years or a colonoscopy every 10 years starting at age 97. Hepatitis C blood test. Hepatitis B blood test. Sexually transmitted disease (STD) testing. Diabetes screening. This is done by checking your blood sugar (glucose) after you have not eaten for a while (fasting). You may have this done every 1-3 years. Bone density scan. This is done to screen for osteoporosis. You may have this done starting at age 95. Mammogram. This may be done every 1-2 years. Talk to your health care provider about how often you should have regular mammograms. Talk with your health care provider about your test results, treatment options, and if necessary, the need for more tests. Vaccines  Your health care provider may recommend certain vaccines, such as: Influenza vaccine. This is recommended every year. Tetanus, diphtheria, and acellular pertussis (Tdap, Td) vaccine. You may need a Td booster every 10 years. Zoster vaccine. You may need this after age 53. Pneumococcal 13-valent conjugate (PCV13) vaccine. One dose is recommended after age 30. Pneumococcal polysaccharide (PPSV23) vaccine. One dose is recommended after age 59. Talk to your health care provider about which screenings and vaccines you need and how often you need them. This information is not intended to replace advice given to  you by your health care provider. Make sure you discuss any questions you have with your  health care provider. Document Released: 06/07/2015 Document Revised: 01/29/2016 Document Reviewed: 03/12/2015 Elsevier Interactive Patient Education  2017 [employer].  Fall Prevention in the Home Falls can cause injuries. They can happen to people of all ages. There are many things you can do to make your home safe and to help prevent falls. What can I do on the outside of my home? Regularly fix the edges of walkways and driveways and fix any cracks. Remove anything that might make you trip as you walk through a door, such as a raised step or threshold. Trim any bushes or trees on the path to your home. Use bright outdoor lighting. Clear any walking paths of anything that might make someone trip, such as rocks or tools. Regularly check to see if handrails are loose or broken. Make sure that both sides of any steps have handrails. Any raised decks and porches should have guardrails on the edges. Have any leaves, snow, or ice cleared regularly. Use sand or salt on walking paths during winter. Clean up any spills in your garage right away. This includes oil or grease spills. What can I do in the bathroom? Use night lights. Install grab bars by the toilet and in the tub and shower. Do not use towel bars as grab bars. Use non-skid mats or decals in the tub or shower. If you need to sit down in the shower, use a plastic, non-slip stool. Keep the floor dry. Clean up any water that spills on the floor as soon as it happens. Remove soap buildup in the tub or shower regularly. Attach bath mats securely with double-sided non-slip rug tape. Do not have throw rugs and other things on the floor that can make you trip. What can I do in the bedroom? Use night lights. Make sure that you have a light by your bed that is easy to reach. Do not use any sheets or blankets that are too big for your bed. They should not hang down onto the floor. Have a firm chair that has side arms. You can use this for  support while you get dressed. Do not have throw rugs and other things on the floor that can make you trip. What can I do in the kitchen? Clean up any spills right away. Avoid walking on wet floors. Keep items that you use a lot in easy-to-reach places. If you need to reach something above you, use a strong step stool that has a grab bar. Keep electrical cords out of the way. Do not use floor polish or wax that makes floors slippery. If you must use wax, use non-skid floor wax. Do not have throw rugs and other things on the floor that can make you trip. What can I do with my stairs? Do not leave any items on the stairs. Make sure that there are handrails on both sides of the stairs and use them. Fix handrails that are broken or loose. Make sure that handrails are as long as the stairways. Check any carpeting to make sure that it is firmly attached to the stairs. Fix any carpet that is loose or worn. Avoid having throw rugs at the top or bottom of the stairs. If you do have throw rugs, attach them to the floor with carpet tape. Make sure that you have a light switch at the top of the stairs and the bottom of the stairs.  If you do not have them, ask someone to add them for you. What else can I do to help prevent falls? Wear shoes that: Do not have high heels. Have rubber bottoms. Are comfortable and fit you well. Are closed at the toe. Do not wear sandals. If you use a stepladder: Make sure that it is fully opened. Do not climb a closed stepladder. Make sure that both sides of the stepladder are locked into place. Ask someone to hold it for you, if possible. Clearly mark and make sure that you can see: Any grab bars or handrails. First and last steps. Where the edge of each step is. Use tools that help you move around (mobility aids) if they are needed. These include: Canes. Walkers. Scooters. Crutches. Turn on the lights when you go into a dark area. Replace any light bulbs as soon  as they burn out. Set up your furniture so you have a clear path. Avoid moving your furniture around. If any of your floors are uneven, fix them. If there are any pets around you, be aware of where they are. Review your medicines with your doctor. Some medicines can make you feel dizzy. This can increase your chance of falling. Ask your doctor what other things that you can do to help prevent falls. This information is not intended to replace advice given to you by your health care provider. Make sure you discuss any questions you have with your health care provider. Document Released: 03/07/2009 Document Revised: 10/17/2015 Document Reviewed: 06/15/2014 Elsevier Interactive Patient Education  2017 [employer].

## 2023-08-19 NOTE — Anesthesia Preprocedure Evaluation (Signed)
 Anesthesia Evaluation  Patient identified by MRN, date of birth, ID band Patient awake    Reviewed: Allergy & Precautions, NPO status , Patient's Chart, lab work & pertinent test results, reviewed documented beta blocker date and time   History of Anesthesia Complications (+) PONV and history of anesthetic complications  Airway Mallampati: IV  TM Distance: >3 FB Neck ROM: Limited    Dental  (+) Dental Advisory Given, Edentulous Lower, Edentulous Upper   Pulmonary former smoker   Pulmonary exam normal breath sounds clear to auscultation       Cardiovascular hypertension, Pt. on medications and Pt. on home beta blockers Normal cardiovascular exam Rhythm:Regular Rate:Normal     Neuro/Psych  PSYCHIATRIC DISORDERS Anxiety Depression    S/p ACDF S/p lumbar spinal fusion    GI/Hepatic Neg liver ROS, hiatal hernia,GERD  Medicated,,  Endo/Other  Obesity   Renal/GU negative Renal ROS     Musculoskeletal  (+) Arthritis , Rheumatoid disorders,  Fibromyalgia -Ankylosing spondylitis   Abdominal   Peds  Hematology negative hematology ROS (+) Plt 273k   Anesthesia Other Findings Day of surgery medications reviewed with the patient.  Reproductive/Obstetrics                             Anesthesia Physical Anesthesia Plan  ASA: 3  Anesthesia Plan: General   Post-op Pain Management: Regional block* and Tylenol PO (pre-op)*   Induction: Intravenous  PONV Risk Score and Plan: 4 or greater and Dexamethasone, Ondansetron and TIVA  Airway Management Planned: LMA  Additional Equipment:   Intra-op Plan:   Post-operative Plan: Extubation in OR  Informed Consent: I have reviewed the patients History and Physical, chart, labs and discussed the procedure including the risks, benefits and alternatives for the proposed anesthesia with the patient or authorized representative who has indicated his/her  understanding and acceptance.     Dental advisory given  Plan Discussed with: CRNA  Anesthesia Plan Comments: (See PAT note 08/09/2023)       Anesthesia Quick Evaluation

## 2023-08-19 NOTE — Progress Notes (Signed)
 Subjective:   Janet Peterson is a 73 y.o. female who presents for Medicare Annual (Subsequent) preventive examination.  Visit Complete: Virtual I connected with  Janet Peterson on 08/19/23 by a audio enabled telemedicine application and verified that I am speaking with the correct person using two identifiers.  Patient Location: Home  Provider Location: Home Office  I discussed the limitations of evaluation and management by telemedicine. The patient expressed understanding and agreed to proceed.  Vital Signs: Because this visit was a virtual/telehealth visit, some criteria may be missing or patient reported. Any vitals not documented were not able to be obtained and vitals that have been documented are patient reported.   Cardiac Risk Factors include: advanced age (>29men, >43 women);hypertension;family history of premature cardiovascular disease     Objective:    There were no vitals filed for this visit. There is no height or weight on file to calculate BMI.     08/09/2023    2:02 PM 05/27/2023    4:32 PM 08/13/2022    2:13 PM 07/21/2022    3:43 PM 09/16/2021    1:28 PM 08/07/2021    2:17 PM 07/31/2021    2:05 PM  Advanced Directives  Does Patient Have a Medical Advance Directive? Yes No Yes Yes Yes Yes Yes  Type of Estate agent of [place];Living will  Living will;Healthcare Power of [employer] Power of [employer] Power of Attorney Healthcare Power of Attorney  Does patient want to make changes to medical advance directive?   No - Patient declined No - Patient declined     Copy of Healthcare Power of Attorney in Chart?   No - copy requested   No - copy requested     Current Medications (verified) Outpatient Encounter Medications as of 08/19/2023  Medication Sig   albuterol (VENTOLIN HFA) 108 (90 Base) MCG/ACT inhaler TAKE 2 PUFFS BY MOUTH EVERY 6 HOURS AS NEEDED FOR WHEEZE OR SHORTNESS OF BREATH   amLODipine (NORVASC) 5 MG tablet Take 1  tablet (5 mg total) by mouth daily.   Budeson-Glycopyrrol-Formoterol (BREZTRI AEROSPHERE) 160-9-4.8 MCG/ACT AERO Inhale 2 puffs into the lungs in the morning and at bedtime.   buPROPion (WELLBUTRIN XL) 300 MG 24 hr tablet Take 300 mg by mouth every morning.   clonazePAM (KLONOPIN) 1 MG tablet TAKE 1 TABLET BY MOUTH EVERY DAY AS NEEDED FOR ANXIETY   clotrimazole (LOTRIMIN AF) 1 % cream Apply 1 Application topically 2 (two) times daily.   dexlansoprazole (DEXILANT) 60 MG capsule TAKE 1 CAPSULE BY MOUTH EVERY DAY   diazepam (VALIUM) 5 MG tablet Take 5 mg by mouth as needed (Prior to MRI).   doxepin (SINEQUAN) 50 MG capsule Take 100 mg by mouth at bedtime.   Evolocumab (REPATHA SURECLICK) 140 MG/ML SOAJ Inject 140 mg into the skin every 14 (fourteen) days.   ezetimibe (ZETIA) 10 MG tablet TAKE 1 TABLET BY MOUTH EVERY DAY   FLUoxetine (PROZAC) 20 MG capsule Take 60 mg by mouth at bedtime.   fluticasone (FLONASE) 50 MCG/ACT nasal spray Place 2 sprays into both nostrils daily as needed for allergies.   INGREZZA 80 MG capsule Take one capsule by mouth daily (Patient taking differently: 80 mg daily.)   leflunomide (ARAVA) 20 MG tablet TAKE 1 TABLET BY MOUTH EVERY DAY   LINZESS 72 MCG capsule TAKE 1 CAPSULE BY MOUTH DAILY BEFORE BREAKFAST. (Patient taking differently: Take 72 mcg by mouth daily as needed (constipation).)   metoprolol succinate (  TOPROL-XL) 50 MG 24 hr tablet TAKE ONE TABLET BY MOUTH DAILY WITH A MEAL.   ondansetron (ZOFRAN) 4 MG tablet Take 1 tablet (4 mg total) by mouth 4 (four) times daily as needed for nausea.   oxyCODONE-acetaminophen (PERCOCET) 5-325 MG tablet Take 1 tablet by mouth every 8 (eight) hours as needed for severe pain (pain score 7-10). Ok to fill after 01/01/23   oxyCODONE-acetaminophen (PERCOCET) 5-325 MG tablet Take 1 tablet by mouth every 8 (eight) hours as needed for severe pain (pain score 7-10). Ok to fill after 01/01/23   SEMAGLUTIDE,0.25 OR 0.5MG /DOS, [place] Inject 1 mg  into the skin once a week.   valsartan (DIOVAN) 320 MG tablet Take 1 tablet (320 mg total) by mouth daily.   No facility-administered encounter medications on file as of 08/19/2023.    Allergies (verified) Latex, Amlodipine besy-benazepril hcl, Amoxicillin-pot clavulanate, Bextra [valdecoxib], Hydrochlorothiazide, Lipitor [atorvastatin calcium], Spironolactone, Sulfa antibiotics, Zocor [simvastatin], and Chlorhexidine   History: Past Medical History:  Diagnosis Date   Anemia    Ankylosing spondylitis (HCC)    Anxiety and depression    Cancer (HCC)    basal cell skin cancer   Depression    Dyspnea    Fibromyalgia    GERD (gastroesophageal reflux disease)    Hiatal hernia    per patient, dx by GI    History of basal cell carcinoma excision    FACE, 1992  &  1996   History of hiatal hernia    History of thrush    Hyperlipidemia    Hypertension    Insomnia    Left breast mass    OCD (obsessive compulsive disorder)    Osteopenia    PONV (postoperative nausea and vomiting)    Psoriatic arthritis (HCC)    PVC (premature ventricular contraction)    Rheumatoid arthritis (HCC)    Past Surgical History:  Procedure Laterality Date   BIOPSY  09/29/2019   Procedure: BIOPSY;  Surgeon: Kerin Salen, MD;  Location: [facility];  Service: Gastroenterology;;   BREAST EXCISIONAL BIOPSY Right    BREAST EXCISIONAL BIOPSY Left 05/04/2018   BREAST LUMPECTOMY WITH RADIOACTIVE SEED LOCALIZATION Left 05/04/2018   Procedure: LEFT BREAST LUMPECTOMY WITH RADIOACTIVE SEED LOCALIZATION AND LEFT BREAST NIPPLE BIOPSY;  Surgeon: Griselda Miner, MD;  Location: [facility];  Service: General;  Laterality: Left;   BREAST SURGERY  05/25/1973   lumpectomy-- benign   BUNIONECTOMY  05/25/1989   CATARACT EXTRACTION W/ INTRAOCULAR LENS  IMPLANT, BILATERAL  05/25/1998   CERVICAL FUSION  07/24/2011   C5 -- C7   CESAREAN SECTION  05/26/1983   COLONOSCOPY WITH PROPOFOL N/A 09/29/2019   Procedure:  COLONOSCOPY WITH PROPOFOL;  Surgeon: Kerin Salen, MD;  Location: [facility];  Service: Gastroenterology;  Laterality: N/A;   DISTAL INTERPHALANGEAL JOINT FUSION Right 03/14/2015   Procedure: RIGHT LONG FINGER DISTAL INTERPHALANGEAL JOINT ARTHRODESIS;  Surgeon: Bradly Bienenstock, MD;  Location: [facility] [place];  Service: Orthopedics;  Laterality: Right;   ESOPHAGOGASTRODUODENOSCOPY (EGD) WITH PROPOFOL N/A 09/29/2019   Procedure: ESOPHAGOGASTRODUODENOSCOPY (EGD) WITH PROPOFOL;  Surgeon: Kerin Salen, MD;  Location: [facility];  Service: Gastroenterology;  Laterality: N/A;   EYE SURGERY  05/25/1993   rk (laser surgery), semi cornea transplant, detacted retina,  fluid removal   HYSTEROSCOPY WITH D & C N/A 12/09/2020   Procedure: DILATATION AND CURETTAGE /HYSTEROSCOPY;  Surgeon: Steva Ready, DO;  Location: [facility];  Service: Gynecology;  Laterality: N/A;   KNEE ARTHROSCOPY Left 03/03/2004  POLYPECTOMY  09/29/2019   Procedure: POLYPECTOMY;  Surgeon: Kerin Salen, MD;  Location: [facility];  Service: Gastroenterology;;   POSTERIOR VITRECTOMY RIGHT EYE AND LASER   10/27/1999   right foot surgery      SHOULDER SURGERY Right 05/26/1995   SKIN BIOPSY  05/2022   lower stomach   SPINAL FIXATION SURGERY W/ IMPLANT  2013 rod #1//  2014  rod 2   S1 -- T10  (rod #1)//   S1 -- T4 (rod #2)   THORACIC FUSION  03/13/2013   REMOVAL HARDWARE/  BONE GRAFT FUSION T10//  REVISION OF RODS   TOTAL KNEE ARTHROPLASTY Left 12/14/2005   UVULOPALATOPHARYNGOPLASTY  04/26/2006   w/  TONSILLECTOMY/  TURBINATE REDUCTIONS/  BILATERAL ANTERIOR ETHMOIDECTOMY   Family History  Problem Relation Age of Onset   Diabetes Mother    Heart disease Mother    Diabetes Father    Anuerysm Father    Diabetes Brother    Heart disease Brother    Diabetes Brother    Heart disease Brother    Breast cancer Neg Hx    Social History   Socioeconomic History   Marital status: Married    Spouse name:  Daryl   Number of children: Not on file   Years of education: Not on file   Highest education level: Bachelor's degree (e.g., BA, AB, BS)  Occupational History    Comment: retired  Tobacco Use   Smoking status: Former    Current packs/day: 0.00    Average packs/day: 0.5 packs/day for 10.0 years (5.0 ttl pk-yrs)    Types: Cigarettes    Start date: 03/06/1985    Quit date: 03/07/1995    Years since quitting: 28.4    Passive exposure: Never   Smokeless tobacco: Never  Vaping Use   Vaping status: Never Used  Substance and Sexual Activity   Alcohol use: Yes    Comment: rare   Drug use: Never   Sexual activity: Not Currently  Other Topics Concern   Not on file  Social History Narrative   Lives with husband   Social Drivers of Health   Financial Resource Strain: Low Risk  (08/19/2023)   Overall Financial Resource Strain (CARDIA)    Difficulty of Paying Living Expenses: Not hard at all  Food Insecurity: No Food Insecurity (08/19/2023)   Hunger Vital Sign    Worried About Running Out of Food in the Last Year: Never true    Ran Out of Food in the Last Year: Never true  Transportation Needs: No Transportation Needs (08/19/2023)   PRAPARE - Administrator, Civil Service (Medical): No    Lack of Transportation (Non-Medical): No  Physical Activity: Inactive (08/19/2023)   Exercise Vital Sign    Days of Exercise per Week: 0 days    Minutes of Exercise per Session: 0 min  Stress: Stress Concern Present (08/19/2023)   [employer] of Occupational Health - Occupational Stress Questionnaire    Feeling of Stress : To some extent  Social Connections: Socially Integrated (08/19/2023)   Social Connection and Isolation Panel [NHANES]    Frequency of Communication with Friends and Family: Twice a week    Frequency of Social Gatherings with Friends and Family: Once a week    Attends Religious Services: More than 4 times per year    Active Member of [employer] or Organizations: No     Attends Engineer, structural: More than 4 times per year    Marital Status:  Married    Tobacco Counseling Counseling given: Not Answered   Clinical Intake:  Pre-visit preparation completed: Yes  Pain : No/denies pain     Diabetes: No  How often do you need to have someone help you when you read instructions, pamphlets, or other written materials from your doctor or pharmacy?: 1 - Never  Interpreter Needed?: No  Information entered by :: Remi Haggard LPN   Activities of Daily Living    08/19/2023    1:57 PM 08/09/2023    2:04 PM  In your present state of health, do you have any difficulty performing the following activities:  Hearing? 0   Vision? 0   Difficulty concentrating or making decisions? 0   Walking or climbing stairs? 1   Dressing or bathing? 0   Doing errands, shopping? 0 0  Preparing Food and eating ? N   Using the Toilet? N   In the past six months, have you accidently leaked urine? N   Do you have problems with loss of bowel control? N   Managing your Medications? N   Managing your Finances? N   Housekeeping or managing your Housekeeping? N     Patient Care Team: Donita Brooks, MD as PCP - General (Family Medicine) Nahser, Deloris Ping, MD as PCP - Cardiology (Cardiology) Pollyann Savoy, MD as Consulting Physician (Rheumatology) Erroll Luna, [facility] (Inactive) as Pharmacist (Pharmacist) Nyoka Cowden, MD as Consulting Physician (Pulmonary Disease)  Indicate any recent Medical Services you may have received from other than [facility] providers in the past year (date may be approximate).     Assessment:   This is a routine wellness examination for [place].  Hearing/Vision screen Hearing Screening - Comments:: No trouble hearing Vision Screening - Comments:: [facility] Up to date   Goals Addressed             This Visit's Progress    Weight (lb) < 200 lb (90.7 kg)         Depression Screen    08/19/2023    2:00 PM 08/10/2023     2:45 PM 03/16/2023    2:32 PM 11/09/2022   12:38 PM 09/29/2022    2:52 PM 08/13/2022    2:12 PM 07/30/2022    2:03 PM  PHQ 2/9 Scores  PHQ - 2 Score 2 0 0 0 0 0 0  PHQ- 9 Score 9          Fall Risk    08/10/2023    2:45 PM 07/20/2023    1:52 PM 03/16/2023    2:32 PM 11/09/2022   12:38 PM 09/29/2022    2:52 PM  Fall Risk   Falls in the past year? 1 1 0 0 0  Number falls in past yr: 0 0 0 0 0  Injury with Fall? 0  0 0 0  Risk for fall due to : History of fall(s);Impaired balance/gait;Impaired mobility Impaired balance/gait No Fall Risks No Fall Risks No Fall Risks  Follow up   Falls prevention discussed Falls prevention discussed Falls prevention discussed    MEDICARE RISK AT HOME: Medicare Risk at Home Any stairs in or around the home?: Yes If so, are there any without handrails?: No Home free of loose throw rugs in walkways, pet beds, electrical cords, etc?: Yes Adequate lighting in your home to reduce risk of falls?: Yes Life alert?: No Use of a cane, walker or w/c?: No Grab bars in the bathroom?: Yes Shower chair or bench in  shower?: No Elevated toilet seat or a handicapped toilet?: Yes  TIMED UP AND GO:  Was the test performed?  No    Cognitive Function:        08/19/2023    2:00 PM 08/13/2022    2:14 PM 08/07/2021    2:20 PM  6CIT Screen  What Year? 0 points 0 points 0 points  What month? 0 points 0 points 0 points  What time? 0 points 0 points 0 points  Count back from 20 0 points 0 points 0 points  Months in reverse 0 points 0 points 0 points  Repeat phrase 2 points 0 points 0 points  Total Score 2 points 0 points 0 points    Immunizations Immunization History  Administered Date(s) Administered   Fluad Quad(high Dose 65+) 01/26/2019, 03/05/2020, 03/18/2022   Fluad Trivalent(High Dose 65+) 03/16/2023   Influenza Whole 03/04/2012   Influenza, High Dose Seasonal PF 03/25/2018   Influenza,inj,Quad PF,6+ Mos 02/28/2013, 03/07/2014, 02/28/2015, 03/27/2016,  03/09/2017   Influenza-Unspecified 04/08/2021   PFIZER(Purple Top)SARS-COV-2 Vaccination 08/10/2019   PNEUMOCOCCAL CONJUGATE-20 10/27/2022   Pneumococcal Conjugate-13 09/10/2016   Pneumococcal Polysaccharide-23 03/12/2006, 11/09/2011   Pneumococcal-Unspecified 08/31/2016   Unspecified SARS-COV-2 Vaccination 08/07/2019, 08/28/2019, 03/08/2020   Zoster Recombinant(Shingrix) 08/12/2021, 11/21/2021    TDAP status: Due, Education has been provided regarding the importance of this vaccine. Advised may receive this vaccine at local pharmacy or Health Dept. Aware to provide a copy of the vaccination record if obtained from local pharmacy or Health Dept. Verbalized acceptance and understanding.  Flu Vaccine status: Up to date  Pneumococcal vaccine status: Up to date  Covid-19 vaccine status: Information provided on how to obtain vaccines.   Qualifies for Shingles Vaccine? No   Zostavax completed Yes   Shingrix Completed?: Yes  Screening Tests Health Maintenance  Topic Date Due   Hepatitis C Screening  09/29/2023 (Originally 03/29/1969)   MAMMOGRAM  06/12/2024   Medicare Annual Wellness (AWV)  08/18/2024   Colonoscopy  09/28/2024   Pneumonia Vaccine 74+ Years old  Completed   INFLUENZA VACCINE  Completed   DEXA SCAN  Completed   Zoster Vaccines- Shingrix  Completed   HPV VACCINES  Aged Out   DTaP/Tdap/Td  Discontinued   COVID-19 Vaccine  Discontinued    Health Maintenance  There are no preventive care reminders to display for this patient.   Colorectal cancer screening: Type of screening: Colonoscopy. Completed 2021. Repeat every 5 years  Mammogram status: Completed  . Repeat every year  Bone Density status: Completed 2022. Results reflect: Bone density results: OSTEOPENIA. Repeat every 5 years.  Lung Cancer Screening: (Low Dose CT Chest recommended if Age 28-80 years, 20 pack-year currently smoking OR have quit w/in 15years.) does not qualify.   Lung Cancer Screening  Referral:   Additional Screening:  Hepatitis C Screening never done  Vision Screening: Recommended annual ophthalmology exams for early detection of glaucoma and other disorders of the eye. Is the patient up to date with their annual eye exam?  Yes  Who is the provider or what is the name of the office in which the patient attends annual eye exams? [facility] If pt is not established with a provider, would they like to be referred to a provider to establish care? No .   Dental Screening: Recommended annual dental exams for proper oral hygiene    Community Resource Referral / Chronic Care Management: CRR required this visit?  No   CCM required this visit?  No  Plan:     I have personally reviewed and noted the following in the patient's chart:   Medical and social history Use of alcohol, tobacco or illicit drugs  Current medications and supplements including opioid prescriptions. Patient is not currently taking opioid prescriptions. Functional ability and status Nutritional status Physical activity Advanced directives List of other physicians Hospitalizations, surgeries, and ER visits in previous 12 months Vitals Screenings to include cognitive, depression, and falls Referrals and appointments  In addition, I have reviewed and discussed with patient certain preventive protocols, quality metrics, and best practice recommendations. A written personalized care plan for preventive services as well as general preventive health recommendations were provided to patient.     Remi Haggard, LPN   1/61/0960   After Visit Summary: (MyChart) Due to this being a telephonic visit, the after visit summary with patients personalized plan was offered to patient via MyChart   Nurse Notes:

## 2023-08-20 ENCOUNTER — Ambulatory Visit (HOSPITAL_COMMUNITY): Payer: Self-pay | Admitting: Physician Assistant

## 2023-08-20 ENCOUNTER — Encounter (HOSPITAL_COMMUNITY)
Admission: RE | Disposition: A | Discharge status: Home / Self Care | Payer: Self-pay | Source: Home / Self Care | Attending: Orthopedic Surgery

## 2023-08-20 ENCOUNTER — Other Ambulatory Visit: Payer: Self-pay

## 2023-08-20 ENCOUNTER — Ambulatory Visit (HOSPITAL_COMMUNITY): Admitting: Anesthesiology

## 2023-08-20 ENCOUNTER — Encounter (HOSPITAL_COMMUNITY): Payer: Self-pay | Admitting: Orthopedic Surgery

## 2023-08-20 ENCOUNTER — Observation Stay (HOSPITAL_COMMUNITY)
Admission: RE | Admit: 2023-08-20 | Discharge: 2023-08-23 | Disposition: A | Discharge status: Home / Self Care | Payer: Medicare Other | Attending: Orthopedic Surgery | Admitting: Orthopedic Surgery

## 2023-08-20 DIAGNOSIS — Z01818 Encounter for other preprocedural examination: Secondary | ICD-10-CM

## 2023-08-20 DIAGNOSIS — Z96651 Presence of right artificial knee joint: Principal | ICD-10-CM

## 2023-08-20 DIAGNOSIS — Z87891 Personal history of nicotine dependence: Secondary | ICD-10-CM | POA: Insufficient documentation

## 2023-08-20 DIAGNOSIS — I10 Essential (primary) hypertension: Secondary | ICD-10-CM

## 2023-08-20 DIAGNOSIS — Z96652 Presence of left artificial knee joint: Secondary | ICD-10-CM | POA: Insufficient documentation

## 2023-08-20 DIAGNOSIS — M1711 Unilateral primary osteoarthritis, right knee: Principal | ICD-10-CM | POA: Insufficient documentation

## 2023-08-20 DIAGNOSIS — Z85828 Personal history of other malignant neoplasm of skin: Secondary | ICD-10-CM | POA: Insufficient documentation

## 2023-08-20 DIAGNOSIS — Z91040 Latex allergy status: Secondary | ICD-10-CM | POA: Insufficient documentation

## 2023-08-20 DIAGNOSIS — F418 Other specified anxiety disorders: Secondary | ICD-10-CM | POA: Diagnosis not present

## 2023-08-20 DIAGNOSIS — Z79899 Other long term (current) drug therapy: Secondary | ICD-10-CM | POA: Insufficient documentation

## 2023-08-20 SURGERY — ARTHROPLASTY, KNEE, TOTAL
Anesthesia: General | Site: Knee | Laterality: Right

## 2023-08-20 MED ORDER — SODIUM CHLORIDE 0.9% FLUSH: 3.0000 mL | INTRAVENOUS | Status: DC | PRN

## 2023-08-20 MED ORDER — BUPROPION HCL ER (XL) 300 MG PO TB24
300.0000 mg | ORAL_TABLET | Freq: Every morning | ORAL | Status: DC
  Administered 2023-08-20 – 2023-08-23 (×4): 300 mg via ORAL
  Filled 2023-08-20 (×4): qty 1

## 2023-08-20 MED ORDER — SEMAGLUTIDE(0.25 OR 0.5MG/DOS) 2 MG/1.5ML ~~LOC~~ SOPN: 1.0000 mg | PEN_INJECTOR | SUBCUTANEOUS | Status: DC

## 2023-08-20 MED ORDER — METOCLOPRAMIDE HCL 5 MG PO TABS: 5.0000 mg | ORAL_TABLET | Freq: Three times a day (TID) | ORAL | Status: DC | PRN

## 2023-08-20 MED ORDER — FENTANYL CITRATE (PF) 100 MCG/2ML IJ SOLN
INTRAMUSCULAR | Status: DC | PRN
Start: 2023-08-20 — End: 2023-08-20
  Administered 2023-08-20 (×2): 25 ug via INTRAVENOUS
  Administered 2023-08-20: 50 ug via INTRAVENOUS

## 2023-08-20 MED ORDER — FENTANYL CITRATE PF 50 MCG/ML IJ SOSY
PREFILLED_SYRINGE | INTRAMUSCULAR | Status: AC
  Administered 2023-08-20: 25 ug via INTRAVENOUS
  Filled 2023-08-20: qty 2

## 2023-08-20 MED ORDER — ARTIFICIAL TEARS OPHTHALMIC OINT: TOPICAL_OINTMENT | Freq: Four times a day (QID) | OPHTHALMIC | Status: DC | PRN

## 2023-08-20 MED ORDER — ONDANSETRON HCL 4 MG/2ML IJ SOLN
4.0000 mg | Freq: Four times a day (QID) | INTRAMUSCULAR | Status: DC | PRN
  Administered 2023-08-21: 4 mg via INTRAVENOUS
  Filled 2023-08-20: qty 2

## 2023-08-20 MED ORDER — BUPIVACAINE LIPOSOME 1.3 % IJ SUSP: 20.0000 mL | Freq: Once | INTRAMUSCULAR | Status: AC

## 2023-08-20 MED ORDER — ASPIRIN 81 MG PO CHEW: 81.0000 mg | CHEWABLE_TABLET | Freq: Two times a day (BID) | ORAL | 0 refills | Status: AC

## 2023-08-20 MED ORDER — DEXMEDETOMIDINE HCL IN NACL 80 MCG/20ML IV SOLN
INTRAVENOUS | Status: DC | PRN
  Administered 2023-08-20 (×2): 4 ug via INTRAVENOUS

## 2023-08-20 MED ORDER — PROPOFOL 1000 MG/100ML IV EMUL
INTRAVENOUS | Status: AC
  Filled 2023-08-20: qty 100

## 2023-08-20 MED ORDER — ALBUTEROL SULFATE HFA 108 (90 BASE) MCG/ACT IN AERS: 2.0000 | INHALATION_SPRAY | Freq: Four times a day (QID) | RESPIRATORY_TRACT | Status: DC | PRN

## 2023-08-20 MED ORDER — BUDESON-GLYCOPYRROL-FORMOTEROL 160-9-4.8 MCG/ACT IN AERO: 2.0000 | INHALATION_SPRAY | Freq: Two times a day (BID) | RESPIRATORY_TRACT | Status: DC

## 2023-08-20 MED ORDER — ONDANSETRON HCL 4 MG PO TABS: 4.0000 mg | ORAL_TABLET | Freq: Four times a day (QID) | ORAL | Status: DC | PRN

## 2023-08-20 MED ORDER — 0.9 % SODIUM CHLORIDE (POUR BTL) OPTIME
TOPICAL | Status: DC | PRN
  Administered 2023-08-20: 1000 mL

## 2023-08-20 MED ORDER — DOCUSATE SODIUM 100 MG PO CAPS
100.0000 mg | ORAL_CAPSULE | Freq: Two times a day (BID) | ORAL | Status: DC
  Administered 2023-08-20 – 2023-08-23 (×6): 100 mg via ORAL
  Filled 2023-08-20 (×6): qty 1

## 2023-08-20 MED ORDER — DEXMEDETOMIDINE HCL IN NACL 80 MCG/20ML IV SOLN
INTRAVENOUS | Status: AC
  Filled 2023-08-20: qty 20

## 2023-08-20 MED ORDER — ALBUMIN HUMAN 5 % IV SOLN: INTRAVENOUS | Status: DC | PRN

## 2023-08-20 MED ORDER — METOCLOPRAMIDE HCL 5 MG/ML IJ SOLN: 5.0000 mg | Freq: Three times a day (TID) | INTRAMUSCULAR | Status: DC | PRN

## 2023-08-20 MED ORDER — LIDOCAINE HCL (PF) 2 % IJ SOLN
INTRAMUSCULAR | Status: AC
  Filled 2023-08-20: qty 5

## 2023-08-20 MED ORDER — ALBUMIN HUMAN 5 % IV SOLN
INTRAVENOUS | Status: AC
  Filled 2023-08-20: qty 250

## 2023-08-20 MED ORDER — PHENYLEPHRINE HCL-NACL 20-0.9 MG/250ML-% IV SOLN
INTRAVENOUS | Status: DC | PRN
  Administered 2023-08-20: 40 ug/min via INTRAVENOUS

## 2023-08-20 MED ORDER — TRANEXAMIC ACID-NACL 1000-0.7 MG/100ML-% IV SOLN: 1000.0000 mg | Freq: Once | INTRAVENOUS | Status: DC

## 2023-08-20 MED ORDER — PROPOFOL 500 MG/50ML IV EMUL
INTRAVENOUS | Status: DC | PRN
  Administered 2023-08-20: 150 ug/kg/min via INTRAVENOUS

## 2023-08-20 MED ORDER — ALBUTEROL SULFATE (2.5 MG/3ML) 0.083% IN NEBU: 2.5000 mg | INHALATION_SOLUTION | Freq: Four times a day (QID) | RESPIRATORY_TRACT | Status: DC | PRN

## 2023-08-20 MED ORDER — WATER FOR IRRIGATION, STERILE IR SOLN
Status: DC | PRN
  Administered 2023-08-20: 1000 mL

## 2023-08-20 MED ORDER — CLOTRIMAZOLE 1 % EX CREA
1.0000 | TOPICAL_CREAM | Freq: Two times a day (BID) | CUTANEOUS | Status: DC
Start: 2023-08-20 — End: 2023-08-23
  Filled 2023-08-20: qty 15

## 2023-08-20 MED ORDER — ROPIVACAINE HCL 5 MG/ML IJ SOLN
INTRAMUSCULAR | Status: DC | PRN
  Administered 2023-08-20: 20 mL via PERINEURAL

## 2023-08-20 MED ORDER — EZETIMIBE 10 MG PO TABS
10.0000 mg | ORAL_TABLET | Freq: Every day | ORAL | Status: DC
  Administered 2023-08-20 – 2023-08-23 (×4): 10 mg via ORAL
  Filled 2023-08-20 (×4): qty 1

## 2023-08-20 MED ORDER — ACETAMINOPHEN 325 MG PO TABS: 325.0000 mg | ORAL_TABLET | Freq: Four times a day (QID) | ORAL | Status: DC | PRN

## 2023-08-20 MED ORDER — METOPROLOL SUCCINATE ER 25 MG PO TB24
50.0000 mg | ORAL_TABLET | Freq: Every day | ORAL | Status: DC
  Administered 2023-08-20: 50 mg via ORAL

## 2023-08-20 MED ORDER — CLONIDINE HCL (ANALGESIA) 100 MCG/ML EP SOLN
EPIDURAL | Status: DC | PRN
  Administered 2023-08-20: 50 ug

## 2023-08-20 MED ORDER — OXYCODONE HCL 5 MG PO TABS
5.0000 mg | ORAL_TABLET | ORAL | Status: DC | PRN
Start: 2023-08-20 — End: 2023-08-23
  Administered 2023-08-20 – 2023-08-23 (×15): 10 mg via ORAL
  Filled 2023-08-20 (×15): qty 2

## 2023-08-20 MED ORDER — ASPIRIN 81 MG PO CHEW
81.0000 mg | CHEWABLE_TABLET | Freq: Two times a day (BID) | ORAL | Status: DC
  Administered 2023-08-20 – 2023-08-23 (×6): 81 mg via ORAL
  Filled 2023-08-20 (×6): qty 1

## 2023-08-20 MED ORDER — DIAZEPAM 5 MG PO TABS: 5.0000 mg | ORAL_TABLET | ORAL | Status: DC | PRN

## 2023-08-20 MED ORDER — OXYCODONE-ACETAMINOPHEN 5-325 MG PO TABS: 1.0000 | ORAL_TABLET | Freq: Three times a day (TID) | ORAL | 0 refills | Status: DC | PRN

## 2023-08-20 MED ORDER — AMLODIPINE BESYLATE 5 MG PO TABS
5.0000 mg | ORAL_TABLET | Freq: Every day | ORAL | Status: DC
  Administered 2023-08-20 – 2023-08-23 (×4): 5 mg via ORAL
  Filled 2023-08-20 (×4): qty 1

## 2023-08-20 MED ORDER — BUPIVACAINE-EPINEPHRINE 0.25% -1:200000 IJ SOLN
INTRAMUSCULAR | Status: DC | PRN
  Administered 2023-08-20: 20 mL

## 2023-08-20 MED ORDER — PHENYLEPHRINE HCL (PRESSORS) 10 MG/ML IV SOLN
INTRAVENOUS | Status: DC | PRN
  Administered 2023-08-20: 160 ug via INTRAVENOUS
  Administered 2023-08-20: 80 ug via INTRAVENOUS

## 2023-08-20 MED ORDER — VALBENAZINE TOSYLATE 40 MG PO CAPS
80.0000 mg | ORAL_CAPSULE | Freq: Every day | ORAL | Status: DC
  Administered 2023-08-20 – 2023-08-23 (×4): 80 mg via ORAL
  Filled 2023-08-20 (×4): qty 2

## 2023-08-20 MED ORDER — ONDANSETRON HCL 4 MG/2ML IJ SOLN
INTRAMUSCULAR | Status: AC
  Filled 2023-08-20: qty 2

## 2023-08-20 MED ORDER — ONDANSETRON HCL 4 MG PO TABS: 4.0000 mg | ORAL_TABLET | Freq: Three times a day (TID) | ORAL | 1 refills | Status: DC | PRN

## 2023-08-20 MED ORDER — CLONAZEPAM 1 MG PO TABS
1.0000 mg | ORAL_TABLET | Freq: Every day | ORAL | Status: DC | PRN
  Administered 2023-08-20 – 2023-08-22 (×2): 1 mg via ORAL
  Filled 2023-08-20 (×2): qty 1

## 2023-08-20 MED ORDER — PROPOFOL 10 MG/ML IV BOLUS
INTRAVENOUS | Status: DC | PRN
  Administered 2023-08-20: 150 mg via INTRAVENOUS

## 2023-08-20 MED ORDER — PROPOFOL 10 MG/ML IV BOLUS
INTRAVENOUS | Status: AC
  Filled 2023-08-20: qty 20

## 2023-08-20 MED ORDER — IRBESARTAN 150 MG PO TABS
300.0000 mg | ORAL_TABLET | Freq: Every day | ORAL | Status: DC
  Administered 2023-08-20 – 2023-08-23 (×4): 300 mg via ORAL
  Filled 2023-08-20 (×4): qty 2

## 2023-08-20 MED ORDER — TRANEXAMIC ACID-NACL 1000-0.7 MG/100ML-% IV SOLN
1000.0000 mg | Freq: Once | INTRAVENOUS | Status: AC
Start: 2023-08-20 — End: 2023-08-20
  Administered 2023-08-20: 1000 mg via INTRAVENOUS
  Filled 2023-08-20: qty 100

## 2023-08-20 MED ORDER — LACTATED RINGERS IV SOLN: INTRAVENOUS | Status: DC

## 2023-08-20 MED ORDER — LINACLOTIDE 72 MCG PO CAPS: 72.0000 ug | ORAL_CAPSULE | Freq: Every day | ORAL | Status: DC | PRN

## 2023-08-20 MED ORDER — BUPIVACAINE-EPINEPHRINE (PF) 0.25% -1:200000 IJ SOLN
INTRAMUSCULAR | Status: AC
  Filled 2023-08-20: qty 30

## 2023-08-20 MED ORDER — FLUTICASONE PROPIONATE 50 MCG/ACT NA SUSP: 2.0000 | Freq: Every day | NASAL | Status: DC | PRN

## 2023-08-20 MED ORDER — CEFAZOLIN SODIUM-DEXTROSE 2-4 GM/100ML-% IV SOLN
2.0000 g | Freq: Four times a day (QID) | INTRAVENOUS | Status: AC
  Administered 2023-08-20 (×2): 2 g via INTRAVENOUS
  Filled 2023-08-20 (×2): qty 100

## 2023-08-20 MED ORDER — DOXEPIN HCL 25 MG PO CAPS
100.0000 mg | ORAL_CAPSULE | Freq: Every day | ORAL | Status: DC
  Administered 2023-08-20 – 2023-08-22 (×3): 100 mg via ORAL
  Filled 2023-08-20 (×4): qty 4
  Filled 2023-08-20: qty 2

## 2023-08-20 MED ORDER — DEXAMETHASONE SODIUM PHOSPHATE 10 MG/ML IJ SOLN
INTRAMUSCULAR | Status: AC
  Filled 2023-08-20: qty 1

## 2023-08-20 MED ORDER — PANTOPRAZOLE SODIUM 40 MG PO TBEC
40.0000 mg | DELAYED_RELEASE_TABLET | Freq: Every day | ORAL | Status: DC
  Administered 2023-08-20 – 2023-08-23 (×4): 40 mg via ORAL
  Filled 2023-08-20 (×4): qty 1

## 2023-08-20 MED ORDER — METHOCARBAMOL 1000 MG/10ML IJ SOLN: 500.0000 mg | Freq: Four times a day (QID) | INTRAMUSCULAR | Status: DC | PRN

## 2023-08-20 MED ORDER — ORAL CARE MOUTH RINSE
15.0000 mL | Freq: Once | OROMUCOSAL | Status: AC
  Administered 2023-08-20: 15 mL via OROMUCOSAL

## 2023-08-20 MED ORDER — PROPOFOL 500 MG/50ML IV EMUL
INTRAVENOUS | Status: AC
  Filled 2023-08-20: qty 50

## 2023-08-20 MED ORDER — LEFLUNOMIDE 10 MG PO TABS
20.0000 mg | ORAL_TABLET | Freq: Every day | ORAL | Status: DC
  Administered 2023-08-20: 20 mg via ORAL
  Filled 2023-08-20 (×4): qty 2

## 2023-08-20 MED ORDER — SODIUM CHLORIDE (PF) 0.9 % IJ SOLN
INTRAMUSCULAR | Status: AC
  Filled 2023-08-20: qty 10

## 2023-08-20 MED ORDER — LIDOCAINE HCL (CARDIAC) PF 100 MG/5ML IV SOSY
PREFILLED_SYRINGE | INTRAVENOUS | Status: DC | PRN
  Administered 2023-08-20: 60 mg via INTRAVENOUS

## 2023-08-20 MED ORDER — BUPIVACAINE LIPOSOME 1.3 % IJ SUSP
INTRAMUSCULAR | Status: DC | PRN
  Administered 2023-08-20: 20 mL

## 2023-08-20 MED ORDER — EPHEDRINE SULFATE (PRESSORS) 50 MG/ML IJ SOLN
INTRAMUSCULAR | Status: DC | PRN
Start: 2023-08-20 — End: 2023-08-20
  Administered 2023-08-20 (×4): 5 mg via INTRAVENOUS
  Administered 2023-08-20 (×2): 10 mg via INTRAVENOUS

## 2023-08-20 MED ORDER — EVOLOCUMAB 140 MG/ML ~~LOC~~ SOAJ
1.0000 mL | SUBCUTANEOUS | Status: DC
Start: 2023-08-20 — End: 2023-08-20

## 2023-08-20 MED ORDER — FENTANYL CITRATE PF 50 MCG/ML IJ SOSY
100.0000 ug | PREFILLED_SYRINGE | INTRAMUSCULAR | Status: DC
  Administered 2023-08-20: 50 ug via INTRAVENOUS
  Filled 2023-08-20: qty 2

## 2023-08-20 MED ORDER — FLUOXETINE HCL 20 MG PO CAPS
60.0000 mg | ORAL_CAPSULE | Freq: Every day | ORAL | Status: DC
  Administered 2023-08-20 – 2023-08-22 (×3): 60 mg via ORAL
  Filled 2023-08-20 (×3): qty 3

## 2023-08-20 MED ORDER — MIDAZOLAM HCL 2 MG/2ML IJ SOLN: 2.0000 mg | INTRAMUSCULAR | Status: DC

## 2023-08-20 MED ORDER — HYDROMORPHONE HCL 1 MG/ML IJ SOLN
0.5000 mg | INTRAMUSCULAR | Status: DC | PRN
  Administered 2023-08-21 – 2023-08-22 (×4): 1 mg via INTRAVENOUS
  Filled 2023-08-20 (×4): qty 1

## 2023-08-20 MED ORDER — DEXAMETHASONE SODIUM PHOSPHATE 10 MG/ML IJ SOLN
INTRAMUSCULAR | Status: DC | PRN
  Administered 2023-08-20 (×2): 4 mg via INTRAVENOUS

## 2023-08-20 MED ORDER — ONDANSETRON HCL 4 MG/2ML IJ SOLN: 4.0000 mg | Freq: Once | INTRAMUSCULAR | Status: DC | PRN

## 2023-08-20 MED ORDER — CEFAZOLIN SODIUM-DEXTROSE 2-4 GM/100ML-% IV SOLN
2.0000 g | INTRAVENOUS | Status: AC
  Administered 2023-08-20: 2 g via INTRAVENOUS
  Filled 2023-08-20: qty 100

## 2023-08-20 MED ORDER — CHLORHEXIDINE GLUCONATE 0.12 % MT SOLN
15.0000 mL | Freq: Once | OROMUCOSAL | Status: DC
Start: 2023-08-20 — End: 2023-08-20

## 2023-08-20 MED ORDER — METHOCARBAMOL 500 MG PO TABS
500.0000 mg | ORAL_TABLET | Freq: Four times a day (QID) | ORAL | Status: DC | PRN
  Administered 2023-08-20 – 2023-08-23 (×7): 500 mg via ORAL
  Filled 2023-08-20 (×7): qty 1

## 2023-08-20 MED ORDER — FENTANYL CITRATE PF 50 MCG/ML IJ SOSY
25.0000 ug | PREFILLED_SYRINGE | INTRAMUSCULAR | Status: DC | PRN
  Administered 2023-08-20: 50 ug via INTRAVENOUS

## 2023-08-20 MED ORDER — POLYETHYLENE GLYCOL 3350 17 G PO PACK: 17.0000 g | PACK | Freq: Every day | ORAL | Status: DC | PRN

## 2023-08-20 MED ORDER — TRANEXAMIC ACID-NACL 1000-0.7 MG/100ML-% IV SOLN
1000.0000 mg | INTRAVENOUS | Status: AC
  Administered 2023-08-20: 1000 mg via INTRAVENOUS
  Filled 2023-08-20: qty 100

## 2023-08-20 MED ORDER — METOPROLOL SUCCINATE ER 25 MG PO TB24
ORAL_TABLET | ORAL | Status: AC
  Filled 2023-08-20: qty 2

## 2023-08-20 MED ORDER — ACETAMINOPHEN 500 MG PO TABS
1000.0000 mg | ORAL_TABLET | Freq: Once | ORAL | Status: AC
  Administered 2023-08-20: 1000 mg via ORAL
  Filled 2023-08-20 (×2): qty 2

## 2023-08-20 MED ORDER — MENTHOL 3 MG MT LOZG: 1.0000 | LOZENGE | OROMUCOSAL | Status: DC | PRN

## 2023-08-20 MED ORDER — SODIUM CHLORIDE (PF) 0.9 % IJ SOLN
INTRAMUSCULAR | Status: DC | PRN
  Administered 2023-08-20: 30 mL

## 2023-08-20 MED ORDER — SODIUM CHLORIDE 0.9% FLUSH
3.0000 mL | Freq: Two times a day (BID) | INTRAVENOUS | Status: DC
Start: 2023-08-20 — End: 2023-08-23
  Administered 2023-08-20 – 2023-08-23 (×5): 10 mL via INTRAVENOUS

## 2023-08-20 MED ORDER — METOPROLOL SUCCINATE ER 50 MG PO TB24: 50.0000 mg | ORAL_TABLET | Freq: Every day | ORAL | Status: DC

## 2023-08-20 MED ORDER — ONDANSETRON HCL 4 MG/2ML IJ SOLN
INTRAMUSCULAR | Status: DC | PRN
  Administered 2023-08-20: 4 mg via INTRAVENOUS

## 2023-08-20 MED ORDER — METHOCARBAMOL 500 MG PO TABS: 500.0000 mg | ORAL_TABLET | Freq: Three times a day (TID) | ORAL | 1 refills | Status: DC | PRN

## 2023-08-20 MED ORDER — POVIDONE-IODINE 10 % EX SWAB: 2.0000 | Freq: Once | CUTANEOUS | Status: AC

## 2023-08-20 MED ORDER — BUPIVACAINE LIPOSOME 1.3 % IJ SUSP
INTRAMUSCULAR | Status: AC
  Filled 2023-08-20: qty 20

## 2023-08-20 MED ORDER — FENTANYL CITRATE (PF) 100 MCG/2ML IJ SOLN
INTRAMUSCULAR | Status: AC
  Filled 2023-08-20: qty 2

## 2023-08-20 MED ORDER — PHENOL 1.4 % MT LIQD: 1.0000 | OROMUCOSAL | Status: DC | PRN

## 2023-08-20 SURGICAL SUPPLY — 47 items
ATTUNE PSFEM RTSZ5 NARCEM KNEE (Femur) IMPLANT
ATTUNE PSRP INSR SZ 5 10M KNEE (Insert) IMPLANT
BAG COUNTER SPONGE SURGICOUNT (BAG) IMPLANT
BAG ZIPLOCK 12X15 (MISCELLANEOUS) IMPLANT
BASE TIBIAL ROT PLAT SZ 5 KNEE (Knees) IMPLANT
BLADE SAG 18X100X1.27 (BLADE) ×2 IMPLANT
BLADE SAW SGTL 13.0X1.19X90.0M (BLADE) IMPLANT
BLADE SAW SGTL 13X75X1.27 (BLADE) ×2 IMPLANT
BNDG ELASTIC 6X10 VLCR STRL LF (GAUZE/BANDAGES/DRESSINGS) ×2 IMPLANT
BNDG ELASTIC 6X15 VLCR STRL LF (GAUZE/BANDAGES/DRESSINGS) IMPLANT
BNDG GAUZE DERMACEA FLUFF 4 (GAUZE/BANDAGES/DRESSINGS) ×2 IMPLANT
BOWL SMART MIX CTS (DISPOSABLE) ×2 IMPLANT
CEMENT HV SMART SET (Cement) ×2 IMPLANT
COVER SURGICAL LIGHT HANDLE (MISCELLANEOUS) ×2 IMPLANT
CUFF TRNQT CYL 34X4.125X (TOURNIQUET CUFF) ×2 IMPLANT
DRAPE INCISE IOBAN 66X45 STRL (DRAPES) IMPLANT
DRAPE SHEET LG 3/4 BI-LAMINATE (DRAPES) ×2 IMPLANT
DRAPE U-SHAPE 47X51 STRL (DRAPES) ×2 IMPLANT
DRSG ADAPTIC 3X8 NADH LF (GAUZE/BANDAGES/DRESSINGS) ×2 IMPLANT
DURAPREP 26ML APPLICATOR (WOUND CARE) ×2 IMPLANT
ELECT REM PT RETURN 15FT ADLT (MISCELLANEOUS) ×2 IMPLANT
GAUZE PAD ABD 8X10 STRL (GAUZE/BANDAGES/DRESSINGS) ×1 IMPLANT
GAUZE SPONGE 4X4 12PLY STRL (GAUZE/BANDAGES/DRESSINGS) ×2 IMPLANT
GLOVE BIOGEL PI IND STRL 7.5 (GLOVE) ×1 IMPLANT
GLOVE BIOGEL PI IND STRL 8.5 (GLOVE) ×2 IMPLANT
GLOVE ORTHO TXT STRL SZ7.5 (GLOVE) ×2 IMPLANT
GLOVE SURG ORTHO 8.5 STRL (GLOVE) ×2 IMPLANT
GOWN STRL REUS W/ TWL XL LVL3 (GOWN DISPOSABLE) ×4 IMPLANT
IMMOBILIZER KNEE 20 (SOFTGOODS) ×1 IMPLANT
IMMOBILIZER KNEE 20 THIGH 36 (SOFTGOODS) ×1 IMPLANT
KIT TURNOVER KIT A (KITS) IMPLANT
MANIFOLD NEPTUNE II (INSTRUMENTS) ×2 IMPLANT
NS IRRIG 1000ML POUR BTL (IV SOLUTION) ×1 IMPLANT
PACK TOTAL KNEE CUSTOM (KITS) ×1 IMPLANT
PATELLA MEDIAL ATTUN 35MM KNEE (Knees) IMPLANT
PIN STEINMAN FIXATION KNEE (PIN) IMPLANT
PROTECTOR NERVE ULNAR (MISCELLANEOUS) ×2 IMPLANT
SET HNDPC FAN SPRY TIP SCT (DISPOSABLE) ×2 IMPLANT
STRIP CLOSURE SKIN 1/2X4 (GAUZE/BANDAGES/DRESSINGS) ×4 IMPLANT
SUT MNCRL AB 3-0 PS2 18 (SUTURE) ×2 IMPLANT
SUT VIC AB 0 CT1 36 (SUTURE) ×1 IMPLANT
SUT VIC AB 1 CT1 36 (SUTURE) ×4 IMPLANT
SUT VIC AB 2-0 CT1 TAPERPNT 27 (SUTURE) ×4 IMPLANT
TIBIAL BASE ROT PLAT SZ 5 KNEE (Knees) ×1 IMPLANT
TRAY CATH INTERMITTENT SS 16FR (CATHETERS) ×1 IMPLANT
WATER STERILE IRR 1000ML POUR (IV SOLUTION) ×4 IMPLANT
YANKAUER SUCT BULB TIP NO VENT (SUCTIONS) ×2 IMPLANT

## 2023-08-20 NOTE — Anesthesia Procedure Notes (Signed)
 Procedure Name: LMA Insertion Date/Time: 08/20/2023 9:19 AM  Performed by: Garth Bigness, CRNAPre-anesthesia Checklist: Patient identified, Emergency Drugs available, Suction available and Patient being monitored Patient Re-evaluated:Patient Re-evaluated prior to induction Oxygen Delivery Method: Circle system utilized Preoxygenation: Pre-oxygenation with 100% oxygen Induction Type: IV induction Ventilation: Mask ventilation without difficulty LMA: LMA inserted LMA Size: 4.0 Tube type: Oral Number of attempts: 1 Placement Confirmation: positive ETCO2 and breath sounds checked- equal and bilateral Tube secured with: Tape Dental Injury: Teeth and Oropharynx as per pre-operative assessment

## 2023-08-20 NOTE — Evaluation (Signed)
 Physical Therapy Evaluation Patient Details Name: Janet Peterson MRN: [id number] DOB: 12-01-1950 Today's Date: 08/20/2023  History of Present Illness  Pt s/p R TKR and with hx of L TKR, OCD, RA, ankylosing spondylitis, and spinal fixation T4 - L1  Clinical Impression  Pt s/p R TKR and presents with decreased R LE strength/ROM and post op pain limiting functional mobility.  Pt should progress to dc home with family assist and reports first OP PT scheduled for 08/23/23.        If plan is discharge home, recommend the following: A little help with walking and/or transfers;A little help with bathing/dressing/bathroom;Assistance with cooking/housework;Assist for transportation;Help with stairs or ramp for entrance   Can travel by private vehicle        Equipment Recommendations None recommended by PT  Recommendations for Other Services       Functional Status Assessment Patient has had a recent decline in their functional status and demonstrates the ability to make significant improvements in function in a reasonable and predictable amount of time.     Precautions / Restrictions Precautions Precautions: Knee;Fall Restrictions Weight Bearing Restrictions Per Provider Order: Yes RLE Weight Bearing Per Provider Order: Weight bearing as tolerated      Mobility  Bed Mobility Overal bed mobility: Needs Assistance Bed Mobility: Supine to Sit     Supine to sit: Min assist     General bed mobility comments: Increased time with assist to manage R LE; extensive use of bedrail    Transfers Overall transfer level: Needs assistance Equipment used: Rolling walker (2 wheels) Transfers: Sit to/from Stand Sit to Stand: Min assist           General transfer comment: cues for sequence and use of L LE to self assist    Ambulation/Gait Ambulation/Gait assistance: Min assist Gait Distance (Feet): 33 Feet Assistive device: Rolling walker (2 wheels) Gait Pattern/deviations: Step-to  pattern, Decreased step length - right, Decreased step length - left, Shuffle, Trunk flexed Gait velocity: decr     General Gait Details: cues for sequence, posture and position from [employer]            Wheelchair Mobility     Tilt Bed    Modified Rankin (Stroke Patients Only)       Balance Overall balance assessment: Needs assistance Sitting-balance support: No upper extremity supported, Feet supported Sitting balance-Leahy Scale: Good     Standing balance support: Bilateral upper extremity supported Standing balance-Leahy Scale: Poor                               Pertinent Vitals/Pain Pain Assessment Pain Assessment: 0-10 Pain Score: 5  Pain Location: R knee Pain Descriptors / Indicators: Aching, Sore Pain Intervention(s): Limited activity within patient's tolerance, Monitored during session, Premedicated before session, Ice applied    Home Living Family/patient expects to be discharged to:: Private residence Living Arrangements: Spouse/significant other Available Help at Discharge: Family;Available 24 hours/day Type of Home: House Home Access: Stairs to enter Entrance Stairs-Rails: Right Entrance Stairs-Number of Steps: 3   Home Layout: One level Home Equipment: Agricultural consultant (2 wheels);Cane - single point      Prior Function Prior Level of Function : Independent/Modified Independent                     Extremity/Trunk Assessment   Upper Extremity Assessment Upper Extremity Assessment: Overall WFL for tasks assessed  Lower Extremity Assessment Lower Extremity Assessment: RLE deficits/detail    Cervical / Trunk Assessment Cervical / Trunk Assessment: Normal  Communication   Communication Communication: No apparent difficulties    Cognition Arousal: Alert Behavior During Therapy: WFL for tasks assessed/performed   PT - Cognitive impairments: No apparent impairments                         Following  commands: Intact       Cueing Cueing Techniques: Verbal cues     General Comments      Exercises Total Joint Exercises Ankle Circles/Pumps: AROM, Both, 15 reps, Supine   Assessment/Plan    PT Assessment Patient needs continued PT services  PT Problem List Decreased strength;Decreased range of motion;Decreased activity tolerance;Decreased balance;Decreased mobility;Decreased knowledge of use of DME;Pain       PT Treatment Interventions DME instruction;Gait training;Stair training;Functional mobility training;Therapeutic activities;Therapeutic exercise;Patient/family education    PT Goals (Current goals can be found in the Care Plan section)  Acute Rehab PT Goals Patient Stated Goal: Regain IND PT Goal Formulation: With patient Time For Goal Achievement: 08/26/23 Potential to Achieve Goals: Good    Frequency 7X/week     Co-evaluation               AM-PAC PT "6 Clicks" Mobility  Outcome Measure Help needed turning from your back to your side while in a flat bed without using bedrails?: A Little Help needed moving from lying on your back to sitting on the side of a flat bed without using bedrails?: A Little Help needed moving to and from a bed to a chair (including a wheelchair)?: A Little Help needed standing up from a chair using your arms (e.g., wheelchair or bedside chair)?: A Little Help needed to walk in hospital room?: A Little Help needed climbing 3-5 steps with a railing? : A Lot 6 Click Score: 17    End of Session Equipment Utilized During Treatment: Gait belt;Right knee immobilizer Activity Tolerance: Patient tolerated treatment well Patient left: in chair;with call bell/phone within reach;with chair alarm set Nurse Communication: Mobility status PT Visit Diagnosis: Difficulty in walking, not elsewhere classified (R26.2)    Time: 1610-9604 PT Time Calculation (min) (ACUTE ONLY): 25 min   Charges:   PT Evaluation $PT Eval Low Complexity: 1 Low PT  Treatments $Gait Training: 8-22 mins PT General Charges $$ ACUTE PT VISIT: 1 Visit        Mauro Kaufmann PT Acute Rehabilitation Services Pager [phone] Office [phone]   Concetta Guion 08/20/2023, 4:43 PM

## 2023-08-20 NOTE — Anesthesia Procedure Notes (Signed)
 Anesthesia Regional Block: Adductor canal block   Pre-Anesthetic Checklist: , timeout performed,  Correct Patient, Correct Site, Correct Laterality,  Correct Procedure, Correct Position, site marked,  Risks and benefits discussed,  Surgical consent,  Pre-op evaluation,  At surgeon's request and post-op pain management  Laterality: Right  Prep: chloraprep       Needles:  Injection technique: Single-shot  Needle Type: Echogenic Needle     Needle Length: 9cm  Needle Gauge: 21     Additional Needles:   Procedures:,,,, ultrasound used (permanent image in chart),,    Narrative:  Start time: 08/20/2023 8:24 AM End time: 08/20/2023 8:31 AM Injection made incrementally with aspirations every 5 mL.  Performed by: Personally  Anesthesiologist: Collene Schlichter, MD  Additional Notes: No pain on injection. No increased resistance to injection. Injection made in 5cc increments.  Good needle visualization.  Patient tolerated procedure well.

## 2023-08-20 NOTE — Discharge Instructions (Signed)
 Ice to the knee constantly.  Keep the incision covered and clean and dry for one week, then ok to get it wet in the shower. Leave Aquacel bandage on all week.   Do exercise as instructed every hour, please to prevent stiffness.    DO NOT prop anything under the knee, it will make your knee stiff.  Prop under the ankle to encourage your knee to go straight.   Use the walker while you are up and around for balance.  Wear your support stockings 24/7 to prevent blood clots and take baby aspirin twice daily for 30 days also to prevent blood clots  Follow up with Dr Ranell Patrick in two weeks in the office, call [phone] for appt  Please call Dr Ranell Patrick (cell) [phone] for any questions or concerns  INSTRUCTIONS AFTER JOINT REPLACEMENT   Remove items at home which could result in a fall. This includes throw rugs or furniture in walking pathways ICE to the affected joint every three hours while awake for 30 minutes at a time, for at least the first 3-5 days, and then as needed for pain and swelling.  Continue to use ice for pain and swelling. You may notice swelling that will progress down to the foot and ankle.  This is normal after surgery.  Elevate your leg when you are not up walking on it.   Continue to use the breathing machine you got in the hospital (incentive spirometer) which will help keep your temperature down.  It is common for your temperature to cycle up and down following surgery, especially at night when you are not up moving around and exerting yourself.  The breathing machine keeps your lungs expanded and your temperature down.   DIET:  As you were doing prior to hospitalization, we recommend a well-balanced diet.  DRESSING / WOUND CARE / SHOWERING  You may change your dressing 3-5 days after surgery.  Then change the dressing every day with sterile gauze.  Please use good hand washing techniques before changing the dressing.  Do not use any lotions or creams on the incision until  instructed by your surgeon.  ACTIVITY  Increase activity slowly as tolerated, but follow the weight bearing instructions below.   No driving for 6 weeks or until further direction given by your physician.  You cannot drive while taking narcotics.  No lifting or carrying greater than 10 lbs. until further directed by your surgeon. Avoid periods of inactivity such as sitting longer than an hour when not asleep. This helps prevent blood clots.  You may return to work once you are authorized by your doctor.     WEIGHT BEARING   Weight bearing as tolerated with assist device (walker, cane, etc) as directed, use it as long as suggested by your surgeon or therapist, typically at least 4-6 weeks.   EXERCISES  Results after joint replacement surgery are often greatly improved when you follow the exercise, range of motion and muscle strengthening exercises prescribed by your doctor. Safety measures are also important to protect the joint from further injury. Any time any of these exercises cause you to have increased pain or swelling, decrease what you are doing until you are comfortable again and then slowly increase them. If you have problems or questions, call your caregiver or physical therapist for advice.   Rehabilitation is important following a joint replacement. After just a few days of immobilization, the muscles of the leg can become weakened and shrink (atrophy).  These exercises are designed to build up the tone and strength of the thigh and leg muscles and to improve motion. Often times heat used for twenty to thirty minutes before working out will loosen up your tissues and help with improving the range of motion but do not use heat for the first two weeks following surgery (sometimes heat can increase post-operative swelling).   These exercises can be done on a training (exercise) mat, on the floor, on a table or on a bed. Use whatever works the best and is most comfortable for you.     Use music or television while you are exercising so that the exercises are a pleasant break in your day. This will make your life better with the exercises acting as a break in your routine that you can look forward to.   Perform all exercises about fifteen times, three times per day or as directed.  You should exercise both the operative leg and the other leg as well.  Exercises include:   Quad Sets - Tighten up the muscle on the front of the thigh (Quad) and hold for 5-10 seconds.   Straight Leg Raises - With your knee straight (if you were given a brace, keep it on), lift the leg to 60 degrees, hold for 3 seconds, and slowly lower the leg.  Perform this exercise against resistance later as your leg gets stronger.  Leg Slides: Lying on your back, slowly slide your foot toward your buttocks, bending your knee up off the floor (only go as far as is comfortable). Then slowly slide your foot back down until your leg is flat on the floor again.  Angel Wings: Lying on your back spread your legs to the side as far apart as you can without causing discomfort.  Hamstring Strength:  Lying on your back, push your heel against the floor with your leg straight by tightening up the muscles of your buttocks.  Repeat, but this time bend your knee to a comfortable angle, and push your heel against the floor.  You may put a pillow under the heel to make it more comfortable if necessary.   A rehabilitation program following joint replacement surgery can speed recovery and prevent re-injury in the future due to weakened muscles. Contact your doctor or a physical therapist for more information on knee rehabilitation.    CONSTIPATION  Constipation is defined medically as fewer than three stools per week and severe constipation as less than one stool per week.  Even if you have a regular bowel pattern at home, your normal regimen is likely to be disrupted due to multiple reasons following surgery.  Combination of  anesthesia, postoperative narcotics, change in appetite and fluid intake all can affect your bowels.   YOU MUST use at least one of the following options; they are listed in order of increasing strength to get the job done.  They are all available over the counter, and you may need to use some, POSSIBLY even all of these options:    Drink plenty of fluids (prune juice may be helpful) and high fiber foods Colace 100 mg by mouth twice a day  Senokot for constipation as directed and as needed Dulcolax (bisacodyl), take with full glass of water  Miralax (polyethylene glycol) once or twice a day as needed.  If you have tried all these things and are unable to have a bowel movement in the first 3-4 days after surgery call either your surgeon or your primary  doctor.    If you experience loose stools or diarrhea, hold the medications until you stool forms back up.  If your symptoms do not get better within 1 week or if they get worse, check with your doctor.  If you experience "the worst abdominal pain ever" or develop nausea or vomiting, please contact the office immediately for further recommendations for treatment.   ITCHING:  If you experience itching with your medications, try taking only a single pain pill, or even half a pain pill at a time.  You can also use Benadryl over the counter for itching or also to help with sleep.   TED HOSE STOCKINGS:  Use stockings on both legs until for at least 2 weeks or as directed by physician office. They may be removed at night for sleeping.  MEDICATIONS:  See your medication summary on the "After Visit Summary" that nursing will review with you.  You may have some home medications which will be placed on hold until you complete the course of blood thinner medication.  It is important for you to complete the blood thinner medication as prescribed.  PRECAUTIONS:  If you experience chest pain or shortness of breath - call 911 immediately for transfer to the  hospital emergency department.   If you develop a fever greater that 101 F, purulent drainage from wound, increased redness or drainage from wound, foul odor from the wound/dressing, or calf pain - CONTACT YOUR SURGEON.                                                   FOLLOW-UP APPOINTMENTS:  If you do not already have a post-op appointment, please call the office for an appointment to be seen by your surgeon.  Guidelines for how soon to be seen are listed in your "After Visit Summary", but are typically between 1-4 weeks after surgery.  OTHER INSTRUCTIONS:   Knee Replacement:  Do not place pillow under knee, focus on keeping the knee straight while resting. CPM instructions: 0-90 degrees, 2 hours in the morning, 2 hours in the afternoon, and 2 hours in the evening. Place foam block, curve side up under heel at all times except when in CPM or when walking.  DO NOT modify, tear, cut, or change the foam block in any way.  POST-OPERATIVE OPIOID TAPER INSTRUCTIONS: It is important to wean off of your opioid medication as soon as possible. If you do not need pain medication after your surgery it is ok to stop day one. Opioids include: Codeine, Hydrocodone(Norco, Vicodin), Oxycodone(Percocet, oxycontin) and hydromorphone amongst others.  Long term and even short term use of opiods can cause: Increased pain response Dependence Constipation Depression Respiratory depression And more.  Withdrawal symptoms can include Flu like symptoms Nausea, vomiting And more Techniques to manage these symptoms Hydrate well Eat regular healthy meals Stay active Use relaxation techniques(deep breathing, meditating, yoga) Do Not substitute Alcohol to help with tapering If you have been on opioids for less than two weeks and do not have pain than it is ok to stop all together.  Plan to wean off of opioids This plan should start within one week post op of your joint replacement. Maintain the same interval or  time between taking each dose and first decrease the dose.  Cut the total daily intake of opioids by one  tablet each day Next start to increase the time between doses. The last dose that should be eliminated is the evening dose.   MAKE SURE YOU:  Understand these instructions.  Get help right away if you are not doing well or get worse.    Thank you for letting us be a part of your medical care team.  It is a privilege we respect greatly.  We hope these instructions will help you stay on track for a fast and full recovery!

## 2023-08-20 NOTE — Progress Notes (Signed)
 Orthopedic Tech Progress Note Patient Details:  Janet Peterson 1951-04-07 [id number] CPM will be removed at 1700. CPM Right Knee CPM Right Knee: On Right Knee Flexion (Degrees): 90 Right Knee Extension (Degrees): 0  Post Interventions Patient Tolerated: Well  Genelle Bal Diarra Ceja 08/20/2023, 1:05 PM

## 2023-08-20 NOTE — Anesthesia Postprocedure Evaluation (Signed)
 Anesthesia Post Note  Patient: Janet Peterson  Procedure(s) Performed: ARTHROPLASTY, KNEE, TOTAL (Right: Knee)     Patient location during evaluation: PACU Anesthesia Type: General Level of consciousness: awake and alert Pain management: pain level controlled Vital Signs Assessment: post-procedure vital signs reviewed and stable Respiratory status: spontaneous breathing, nonlabored ventilation and respiratory function stable Cardiovascular status: blood pressure returned to baseline and stable Postop Assessment: no apparent nausea or vomiting Anesthetic complications: no   No notable events documented.  Last Vitals:  Vitals:   08/20/23 1220 08/20/23 1237  BP: 112/72 131/79  Pulse: 74 73  Resp: 13 14  Temp: (!) 36.4 C   SpO2: 94% 92%    Last Pain:  Vitals:   08/20/23 1215  TempSrc:   PainSc: Asleep                 Collene Schlichter

## 2023-08-20 NOTE — Interval H&P Note (Signed)
 History and Physical Interval Note:  08/20/2023 7:30 AM  Janet Peterson  has presented today for surgery, with the diagnosis of Arthritis of right knee.  The various methods of treatment have been discussed with the patient and family. After consideration of risks, benefits and other options for treatment, the patient has consented to  Procedure(s): ARTHROPLASTY, KNEE, TOTAL (Right) as a surgical intervention.  The patient's history has been reviewed, patient examined, no change in status, stable for surgery.  I have reviewed the patient's chart and labs.  Questions were answered to the patient's satisfaction.     Verlee Rossetti

## 2023-08-20 NOTE — Telephone Encounter (Signed)
 Requested Prescriptions  Pending Prescriptions Disp Refills   metoprolol succinate (TOPROL-XL) 50 MG 24 hr tablet [Pharmacy Med Name: METOPROLOL SUCC ER 50 MG TAB] 90 tablet 0    Sig: TAKE 1 TABLET BY MOUTH DAILY WITH A MEAL.     Cardiovascular:  Beta Blockers Passed - 08/20/2023 10:06 AM      Passed - Last BP in normal range    BP Readings from Last 1 Encounters:  08/20/23 92/69         Passed - Last Heart Rate in normal range    Pulse Readings from Last 1 Encounters:  08/20/23 64         Passed - Valid encounter within last 6 months    Recent Outpatient Visits           4 weeks ago Iron deficiency anemia, unspecified iron deficiency anemia type   [facility] Donita Brooks, MD   2 months ago Anemia, unspecified type   [facility] Donita Brooks, MD   4 months ago Acute cystitis without hematuria   [facility] [place] Meo, FNP   5 months ago Dyspnea on exertion   [facility] Donita Brooks, MD   7 months ago Hyponatremia   [facility] Pickard, [name], MD       Future Appointments             In 2 weeks Amada Jupiter Orie Fisherman, PA-C [facility] - A Dept Of [place]

## 2023-08-20 NOTE — Brief Op Note (Signed)
 08/20/2023  11:02 AM  PATIENT:  Janet Peterson  73 y.o. female  PRE-OPERATIVE DIAGNOSIS:  Arthritis of right knee, end stage  POST-OPERATIVE DIAGNOSIS:  Arthritis of right knee, end stage  PROCEDURE:  Procedure(s): ARTHROPLASTY, KNEE, TOTAL (Right) DePuy Attune  SURGEON:  Surgeons and Role:    Beverely Low, MD - Primary  PHYSICIAN ASSISTANT:   ASSISTANTS: Thea Gist, PA-C   ANESTHESIA:   general  EBL:  30 mL   BLOOD ADMINISTERED:none  DRAINS: none   LOCAL MEDICATIONS USED:  MARCAINE     SPECIMEN:  No Specimen  DISPOSITION OF SPECIMEN:  N/A  COUNTS:  YES  TOURNIQUET:   Total Tourniquet Time Documented: Thigh (Right) - 75 minutes Total: Thigh (Right) - 75 minutes   DICTATION: .Other Dictation: Dictation Number [id number]  PLAN OF CARE: Admit for overnight observation  PATIENT DISPOSITION:  PACU - hemodynamically stable.   Delay start of Pharmacological VTE agent (>24hrs) due to surgical blood loss or risk of bleeding: no

## 2023-08-20 NOTE — Progress Notes (Signed)
 Orthopedic Tech Progress Note Patient Details:  Janet Peterson [birth year] [id number]  Ortho Devices Type of Ortho Device: Bone foam zero knee Ortho Device/Splint Location: RLE Ortho Device/Splint Interventions: Application   Post Interventions Patient Tolerated: Well  [name] 08/20/2023, 11:46 AM

## 2023-08-20 NOTE — Op Note (Signed)
 NAME: Janet Peterson, SKEET MEDICAL RECORD NO: [id number] ACCOUNT NO: [id number] DATE OF BIRTH: Nov 25, 1950 FACILITY: [facility] LOCATION: [id number] PHYSICIAN: Almedia Balls. Ranell Patrick, MD  Operative Report   DATE OF PROCEDURE: 08/20/2023  PREOPERATIVE DIAGNOSIS:  Right knee end-stage arthritis.  POSTOPERATIVE DIAGNOSIS:  Right knee end-stage arthritis.  PROCEDURE PERFORMED:  Right total knee arthroplasty using Depuy Attune prosthesis.  ATTENDING SURGEON:  Almedia Balls. Ranell Patrick, MD  ASSISTANT:  Konrad Felix Dixon, [place], who was scrubbed during the entire procedure, and necessary for satisfactory completion of surgery.  ANESTHESIA:  General anesthesia was used plus local.  ESTIMATED BLOOD LOSS:  Minimal.  FLUID REPLACEMENT:  1000 mL crystalloid.  COUNTS:  Instrument count was correct.  COMPLICATIONS:  No complications.  ANTIBIOTICS:  Perioperative antibiotics were given.  TOURNIQUET TIME:  75 minutes at 300 mmHg.  INDICATIONS:  The patient is a 73 year old female who presents with worsening right knee pain secondary to end-stage osteoarthritis.  The patient has had progressive pain despite conservative management and desires operative treatment to eliminate pain  and restore function.  Informed consent was obtained.  DESCRIPTION OF PROCEDURE:  After an adequate level of anesthesia was achieved, the patient was positioned supine on the operating room table.  The right leg was correctly identified and nonsterile Tourniquet placed on the proximal thigh.  Right leg was  sterilely prepped and draped in the usual manner.  Timeout called verifying correct patient and correct site.  We elevated the leg and exsanguinated with an Esmarch bandage and inflating the tourniquet to 300 mmHg.  We then placed the knee in flexion and  performed a longitudinal midline incision with a 10 blade scalpel.  Dissection down through subcutaneous tissues.  We used a fresh 10 blade for the medial parapatellar arthrotomy.  We everted the  patella exposing bone-on-bone arthritis with complete  cartilage loss in the medial compartment.  We entered the distal femur with a step-cut drill.  We then placed an intramedullary guide and resected 9 mm off the distal femur set on 5 degrees of valgus.  Once we had our distal cut done, we sized our femur  to a size 5 anterior down, performing anterior, posterior, and chamfer cuts with a 4-in-1 block.  We then removed ACL, PCL, and meniscal tissues, subluxing the tibia anteriorly.  Next, we cut the tibia 90 degrees perpendicular to the long axis of the  tibia with minimal posterior slope, resecting only 2 mm off the affected medial side.  Once we had our tibial cut done with the external jig, we used a lamina spreader to remove posterior femoral condyle osteophytes and release the capsule.  I then  injected the posterior capsule with a combination of Marcaine, Exparel, and saline.  Once we had that done, we checked our gaps, which were symmetric at 6 mm.  We then completed our tibia preparation with a modular drill and keel punch for the 5 tibia.   We then went to the femur and cut the box for the 5 right narrow femur.  We placed our trial components in place, placed the knee in extension with a 6-mm poly trial.  We had excellent extension and good flexion and stability.  We went ahead and then  resurfaced the patella going from a 22-mm thickness down to a 14-mm thickness.  We cut with the patellar jig and then we went ahead and drilled our lug holes for the 35 patellar button.  We ranged the knee with the trial button in place with  normal  tracking with no touch technique.  We then removed all trial components, irrigated the bone well, dried the bone well.  We vacuum mixed high viscosity cement on the back table and cemented the components into place all in one step including tibia, femur,  and patella.  We placed the knee in extension with a size 5 8 mm poly to allow the cement to set up and also had a  patellar clamp while the cement set up.  We removed excess cement with a quarter inch curved osteotome.  We did a final inspection  throughout the knee and selected the real size 5, 10 mm poly placed on the tibial tray and reduced the knee.  Nice little pop as that medial condyle reduced.  With the knee in extension, we injected the anterior capsule with a combination of Marcaine,  Exparel and saline.  We then irrigated thoroughly and then closed the parapatellar arthrotomy with #1 Vicryl suture followed by 2-0 Vicryl for subcutaneous closure and 4-0 Monocryl for skin.  Steri-Strips were applied followed by a sterile dressing.  The  patient tolerated the surgery well.   PUS D: 08/20/2023 11:09:04 am T: 08/20/2023 5:43:00 pm  JOB: [id number]/ [id number]

## 2023-08-20 NOTE — Transfer of Care (Signed)
 Immediate Anesthesia Transfer of Care Note  Patient: ALIZON SCHMELING  Procedure(s) Performed: ARTHROPLASTY, KNEE, TOTAL (Right: Knee)  Patient Location: PACU  Anesthesia Type:General  Level of Consciousness: awake, alert , oriented, and patient cooperative  Airway & Oxygen Therapy: Patient Spontanous Breathing and Patient connected to face mask oxygen  Post-op Assessment: Report given to RN and Post -op Vital signs reviewed and stable  Post vital signs: Reviewed and stable  Last Vitals:  Vitals Value Taken Time  BP 101/59 08/20/23 1106  Temp    Pulse 75 08/20/23 1110  Resp 14 08/20/23 1110  SpO2 100 % 08/20/23 1110  Vitals shown include unfiled device data.  Last Pain:  Vitals:   08/20/23 0835  TempSrc:   PainSc: 0-No pain         Complications: No notable events documented.

## 2023-08-20 NOTE — TOC Transition Note (Signed)
 Transition of Care [facility]) - Discharge Note   Patient Details  Name: Janet Peterson MRN: [id number] Date of Birth: 1951-01-07  [facility] CM/SW Contact:  Amada Jupiter, LCSW Phone Number: 08/20/2023, 1:55 PM   Clinical Narrative:     Met with pt who confirms she has needed DME in the room.  Pt confirms OPPT already arranged in [place].  No further TOC needs.  Final next level of care: OP Rehab Barriers to Discharge: No Barriers Identified   Patient Goals and CMS Choice Patient states their goals for this hospitalization and ongoing recovery are:: return home          Discharge Placement                       Discharge Plan and Services Additional resources added to the After Visit Summary for                  DME Arranged: N/A DME Agency: NA                  Social Drivers of Health (SDOH) Interventions SDOH Screenings   Food Insecurity: No Food Insecurity (08/20/2023)  Housing: Low Risk  (08/20/2023)  Transportation Needs: No Transportation Needs (08/20/2023)  Utilities: Not At Risk (08/20/2023)  Alcohol Screen: Low Risk  (08/19/2023)  Depression (PHQ2-9): Medium Risk (08/19/2023)  Financial Resource Strain: Low Risk  (08/19/2023)  Physical Activity: Inactive (08/19/2023)  Social Connections: Socially Integrated (08/20/2023)  Stress: Stress Concern Present (08/19/2023)  Tobacco Use: Medium Risk (08/20/2023)  Health Literacy: Adequate Health Literacy (08/19/2023)     Readmission Risk Interventions     No data to display

## 2023-08-21 DIAGNOSIS — M1711 Unilateral primary osteoarthritis, right knee: Secondary | ICD-10-CM | POA: Diagnosis not present

## 2023-08-21 LAB — BASIC METABOLIC PANEL WITH GFR
Anion gap: 8 (ref 5–15)
BUN: 11 mg/dL (ref 8–23)
CO2: 25 mmol/L (ref 22–32)
Calcium: 9 mg/dL (ref 8.9–10.3)
Chloride: 103 mmol/L (ref 98–111)
Creatinine, Ser: 0.91 mg/dL (ref 0.44–1.00)
GFR, Estimated: >60 mL/min (ref >60)
Glucose, Bld: 138 mg/dL — ABNORMAL HIGH (ref 70–99)
Potassium: 3.6 mmol/L (ref 3.5–5.1)
Sodium: 136 mmol/L (ref 135–145)

## 2023-08-21 LAB — CBC
HCT: 30.6 % — ABNORMAL LOW (ref 36.0–46.0)
Hemoglobin: 9.8 g/dL — ABNORMAL LOW (ref 12.0–15.0)
MCH: 32.1 pg (ref 26.0–34.0)
MCHC: 32 g/dL (ref 30.0–36.0)
MCV: 100.3 fL — ABNORMAL HIGH (ref 80.0–100.0)
Platelets: 188 10*3/uL (ref 150–400)
RBC: 3.05 MIL/uL — ABNORMAL LOW (ref 3.87–5.11)
RDW: 13.9 % (ref 11.5–15.5)
WBC: 9 10*3/uL (ref 4.0–10.5)
nRBC: 0 % (ref 0.0–0.2)

## 2023-08-21 NOTE — TOC Transition Note (Signed)
 Transition of Care [facility]) - Discharge Note   Patient Details  Name: Janet Peterson MRN: [id number] Date of Birth: [birth year]  Transition of Care [facility]) CM/SW Contact:  Howell Rucks, RN Phone Number: 08/21/2023, 12:40 PM   Clinical Narrative:   MOON completed.    Final next level of care: OP Rehab Barriers to Discharge: No Barriers Identified   Patient Goals and CMS Choice Patient states their goals for this hospitalization and ongoing recovery are:: return home          Discharge Placement                       Discharge Plan and Services Additional resources added to the After Visit Summary for                  DME Arranged: N/A DME Agency: NA                  Social Drivers of Health (SDOH) Interventions SDOH Screenings   Food Insecurity: No Food Insecurity (08/20/2023)  Housing: Low Risk  (08/20/2023)  Transportation Needs: No Transportation Needs (08/20/2023)  Utilities: Not At Risk (08/20/2023)  Alcohol Screen: Low Risk  (08/19/2023)  Depression (PHQ2-9): Medium Risk (08/19/2023)  Financial Resource Strain: Low Risk  (08/19/2023)  Physical Activity: Inactive (08/19/2023)  Social Connections: Socially Integrated (08/20/2023)  Stress: Stress Concern Present (08/19/2023)  Tobacco Use: Medium Risk (08/20/2023)  Health Literacy: Adequate Health Literacy (08/19/2023)     Readmission Risk Interventions     No data to display

## 2023-08-21 NOTE — Plan of Care (Signed)
  Problem: Health Behavior/Discharge Planning: Goal: Ability to manage health-related needs will improve Outcome: Progressing   Problem: Clinical Measurements: Goal: Ability to maintain clinical measurements within normal limits will improve Outcome: Progressing Goal: Will remain free from infection Outcome: Progressing Goal: Diagnostic test results will improve Outcome: Progressing Goal: Respiratory complications will improve Outcome: Progressing Goal: Cardiovascular complication will be avoided Outcome: Progressing   Problem: Activity: Goal: Risk for activity intolerance will decrease Outcome: Progressing   Problem: Nutrition: Goal: Adequate nutrition will be maintained Outcome: Progressing   Problem: Coping: Goal: Level of anxiety will decrease Outcome: Progressing   Problem: Elimination: Goal: Will not experience complications related to bowel motility Outcome: Progressing Goal: Will not experience complications related to urinary retention Outcome: Progressing   Problem: Pain Managment: Goal: General experience of comfort will improve and/or be controlled Outcome: Progressing   Problem: Safety: Goal: Ability to remain free from injury will improve Outcome: Progressing   Problem: Skin Integrity: Goal: Risk for impaired skin integrity will decrease Outcome: Progressing   Problem: Education: Goal: Knowledge of the prescribed therapeutic regimen will improve Outcome: Progressing   Problem: Activity: Goal: Ability to avoid complications of mobility impairment will improve Outcome: Progressing Goal: Range of joint motion will improve Outcome: Progressing   Problem: Clinical Measurements: Goal: Postoperative complications will be avoided or minimized Outcome: Progressing   Problem: Pain Management: Goal: Pain level will decrease with appropriate interventions Outcome: Progressing   Problem: Skin Integrity: Goal: Will show signs of wound healing Outcome:  Progressing

## 2023-08-21 NOTE — Progress Notes (Signed)
 Physical Therapy Treatment Patient Details Name: Janet Peterson MRN: [id number] DOB: 1951-02-18 Today's Date: 08/21/2023   History of Present Illness Pt s/p R TKR and with hx of L TKR, OCD, RA, ankylosing spondylitis, and spinal fixation T4 - L1    PT Comments  Pt requiring increased assist and increased time for all tasks but reports some improvement in pain control vs am.  Pt reviewed KI don/doff with spouse present and up to ambulate limited distance into hall and back before assisting back to bed and positioned for comfort.  Pt unable to attempt stairs safely at this time but hopeful for dc home tomorrow.    If plan is discharge home, recommend the following: A little help with walking and/or transfers;A little help with bathing/dressing/bathroom;Assistance with cooking/housework;Assist for transportation;Help with stairs or ramp for entrance   Can travel by private vehicle        Equipment Recommendations  None recommended by PT    Recommendations for Other Services       Precautions / Restrictions Precautions Precautions: Knee;Fall Required Braces or Orthoses: Knee Immobilizer - Right Knee Immobilizer - Right: Discontinue once straight leg raise with < 10 degree lag Restrictions Weight Bearing Restrictions Per Provider Order: No RLE Weight Bearing Per Provider Order: Weight bearing as tolerated     Mobility  Bed Mobility Overal bed mobility: Needs Assistance Bed Mobility: Supine to Sit, Sit to Supine     Supine to sit: Mod assist Sit to supine: Mod assist   General bed mobility comments: increased time with cues for sequence and use of L LE to self assist.  Physical assist to manage R LE to bring trunk to upright and to complete rotation using bed pad    Transfers Overall transfer level: Needs assistance Equipment used: Rolling walker (2 wheels) Transfers: Sit to/from Stand Sit to Stand: Min assist, From elevated surface           General transfer comment: cues  for LE management and use of UEs to self assist    Ambulation/Gait Ambulation/Gait assistance: Min assist Gait Distance (Feet): 38 Feet Assistive device: Rolling walker (2 wheels) Gait Pattern/deviations: Step-to pattern, Decreased step length - right, Decreased step length - left, Shuffle, Trunk flexed Gait velocity: decr     General Gait Details: cues for sequence, posture and position from [employer]             Wheelchair Mobility     Tilt Bed    Modified Rankin (Stroke Patients Only)       Balance Overall balance assessment: Needs assistance Sitting-balance support: No upper extremity supported, Feet supported Sitting balance-Leahy Scale: Good     Standing balance support: Bilateral upper extremity supported Standing balance-Leahy Scale: Poor                              Communication Communication Communication: No apparent difficulties  Cognition Arousal: Alert Behavior During Therapy: WFL for tasks assessed/performed   PT - Cognitive impairments: No apparent impairments                         Following commands: Intact      Cueing Cueing Techniques: Verbal cues  Exercises Total Joint Exercises Ankle Circles/Pumps: AROM, Both, 15 reps, Supine Quad Sets: AROM, Both, 10 reps, Supine Heel Slides: AAROM, 15 reps, Right, Supine Straight Leg Raises: AAROM, Right, 10 reps, Supine  General Comments        Pertinent Vitals/Pain Pain Assessment Pain Assessment: 0-10 Pain Score: 7  Pain Location: R knee Pain Descriptors / Indicators: Aching, Sore Pain Intervention(s): Limited activity within patient's tolerance, Monitored during session, Premedicated before session, Ice applied    Home Living                          Prior Function            PT Goals (current goals can now be found in the care plan section) Acute Rehab PT Goals Patient Stated Goal: Regain IND PT Goal Formulation: With patient Time  For Goal Achievement: 08/26/23 Potential to Achieve Goals: Good Progress towards PT goals: Progressing toward goals    Frequency    7X/week      PT Plan      Co-evaluation              AM-PAC PT "6 Clicks" Mobility   Outcome Measure  Help needed turning from your back to your side while in a flat bed without using bedrails?: A Lot Help needed moving from lying on your back to sitting on the side of a flat bed without using bedrails?: A Little Help needed moving to and from a bed to a chair (including a wheelchair)?: A Lot Help needed standing up from a chair using your arms (e.g., wheelchair or bedside chair)?: A Little Help needed to walk in hospital room?: A Little Help needed climbing 3-5 steps with a railing? : A Lot 6 Click Score: 15    End of Session Equipment Utilized During Treatment: Gait belt;Right knee immobilizer Activity Tolerance: Patient tolerated treatment well;Patient limited by pain Patient left: in bed;with call bell/phone within reach;with bed alarm set;with family/visitor present Nurse Communication: Mobility status PT Visit Diagnosis: Difficulty in walking, not elsewhere classified (R26.2)     Time: 1610-9604 PT Time Calculation (min) (ACUTE ONLY): 32 min  Charges:    $Gait Training: 8-22 mins $Therapeutic Exercise: 8-22 mins $Therapeutic Activity: 8-22 mins PT General Charges $$ ACUTE PT VISIT: 1 Visit                     Mauro Kaufmann PT Acute Rehabilitation Services Pager [phone] Office [phone]    Hassell Patras 08/21/2023, 2:59 PM

## 2023-08-21 NOTE — Care Management Obs Status (Signed)
 MEDICARE OBSERVATION STATUS NOTIFICATION   Patient Details  Name: Janet Peterson MRN: [id number] Date of Birth: [birth year]   Medicare Observation Status Notification Given:  Yes    Howell Rucks, RN 08/21/2023, 10:26 AM

## 2023-08-21 NOTE — Progress Notes (Signed)
 Physical Therapy Treatment Patient Details Name: Janet Peterson MRN: [id number] DOB: [birth year] Today's Date: 08/21/2023   History of Present Illness Pt s/p R TKR and with hx of L TKR, OCD, RA, ankylosing spondylitis, and spinal fixation T4 - L1    PT Comments  Pt very cooperative but with marked elevation in pain level.  Pt agreeable to initiate HEP but mobility deferred 2* pain following recent transfers with nursing to [facility] and recliner.    If plan is discharge home, recommend the following: A little help with walking and/or transfers;A little help with bathing/dressing/bathroom;Assistance with cooking/housework;Assist for transportation;Help with stairs or ramp for entrance   Can travel by private vehicle        Equipment Recommendations  None recommended by PT    Recommendations for Other Services       Precautions / Restrictions Precautions Precautions: Knee;Fall Required Braces or Orthoses: Knee Immobilizer - Right Knee Immobilizer - Right: Discontinue once straight leg raise with < 10 degree lag Restrictions Weight Bearing Restrictions Per Provider Order: No RLE Weight Bearing Per Provider Order: Weight bearing as tolerated     Mobility  Bed Mobility               General bed mobility comments: Pt up with nursing to [facility] and to Recliner    Transfers                   General transfer comment: Mobility deferred 2* pain level    Ambulation/Gait                   Stairs             Wheelchair Mobility     Tilt Bed    Modified Rankin (Stroke Patients Only)       Balance                                            Communication Communication Communication: No apparent difficulties  Cognition Arousal: Alert Behavior During Therapy: WFL for tasks assessed/performed   PT - Cognitive impairments: No apparent impairments                         Following commands: Intact      Cueing Cueing  Techniques: Verbal cues  Exercises Total Joint Exercises Ankle Circles/Pumps: AROM, Both, 15 reps, Supine Quad Sets: AROM, Both, 10 reps, Supine Heel Slides: AAROM, 15 reps, Right, Supine Straight Leg Raises: AAROM, Right, 10 reps, Supine    General Comments        Pertinent Vitals/Pain Pain Assessment Pain Assessment: 0-10 Pain Score: 8  Pain Location: R knee Pain Descriptors / Indicators: Aching, Sore Pain Intervention(s): Limited activity within patient's tolerance, Monitored during session, Premedicated before session, Ice applied    Home Living                          Prior Function            PT Goals (current goals can now be found in the care plan section) Acute Rehab PT Goals Patient Stated Goal: Regain IND PT Goal Formulation: With patient Time For Goal Achievement: 08/26/23 Potential to Achieve Goals: Good Progress towards PT goals: Progressing toward goals    Frequency    7X/week  PT Plan      Co-evaluation              AM-PAC PT "6 Clicks" Mobility   Outcome Measure  Help needed turning from your back to your side while in a flat bed without using bedrails?: A Little Help needed moving from lying on your back to sitting on the side of a flat bed without using bedrails?: A Little Help needed moving to and from a bed to a chair (including a wheelchair)?: A Little Help needed standing up from a chair using your arms (e.g., wheelchair or bedside chair)?: A Little Help needed to walk in hospital room?: A Little Help needed climbing 3-5 steps with a railing? : A Lot 6 Click Score: 17    End of Session   Activity Tolerance: Patient tolerated treatment well;Patient limited by pain Patient left: in chair;with call bell/phone within reach;with chair alarm set   PT Visit Diagnosis: Difficulty in walking, not elsewhere classified (R26.2)     Time: 0981-1914 PT Time Calculation (min) (ACUTE ONLY): 34 min  Charges:     $Therapeutic Exercise: 8-22 mins PT General Charges $$ ACUTE PT VISIT: 1 Visit                     Mauro Kaufmann PT Acute Rehabilitation Services Pager [phone] Office [phone]    Ceola Para 08/21/2023, 1:04 PM

## 2023-08-21 NOTE — Progress Notes (Signed)
 Subjective: 1 Day Post-Op Procedure(s) (LRB): ARTHROPLASTY, KNEE, TOTAL (Right)  Patient reports pain as appropriately controlled. Denies any new numbness/tingling.   Objective:   VITALS:  Temp:  [97.5 F (36.4 C)-98.2 F (36.8 C)] 98 F (36.7 C) (03/29 0604) Pulse Rate:  [68-81] 81 (03/29 0604) Resp:  [12-18] 17 (03/29 0604) BP: (93-164)/(59-83) 164/83 (03/29 0908) SpO2:  [89 %-100 %] 97 % (03/29 0604)  Gen: AAOx3, NAD Comfortable at rest  Right Lower Extremity: Dressings intact ADF/APF/EHL 5/5 SILT throughout DP, PT 2+ to palp CR < 2s   LABS Recent Labs    08/21/23 0331  HGB 9.8*  WBC 9.0  PLT 188   Recent Labs    08/21/23 0331  NA 136  K 3.6  CL 103  CO2 25  BUN 11  CREATININE 0.91  GLUCOSE 138*   No results for input(s): "LABPT", "INR" in the last 72 hours.   Assessment/Plan: 1 Day Post-Op Procedure(s) (LRB): ARTHROPLASTY, KNEE, TOTAL (Right)  Advance diet Up with therapy D/C IV fluids Discharge home with home health  Netta Cedars 08/21/2023, 9:44 AM

## 2023-08-21 NOTE — Progress Notes (Signed)
 Rating pain 7/10 this morning. Requiring IV pain meds. Had difficulty transferring from bed to [facility]. PT aware and in to evaluate.

## 2023-08-22 DIAGNOSIS — M1711 Unilateral primary osteoarthritis, right knee: Secondary | ICD-10-CM | POA: Diagnosis not present

## 2023-08-22 LAB — CBC
HCT: 34 % — ABNORMAL LOW (ref 36.0–46.0)
Hemoglobin: 10.9 g/dL — ABNORMAL LOW (ref 12.0–15.0)
MCH: 32.4 pg (ref 26.0–34.0)
MCHC: 32.1 g/dL (ref 30.0–36.0)
MCV: 101.2 fL — ABNORMAL HIGH (ref 80.0–100.0)
Platelets: 189 10*3/uL (ref 150–400)
RBC: 3.36 MIL/uL — ABNORMAL LOW (ref 3.87–5.11)
RDW: 14 % (ref 11.5–15.5)
WBC: 9.9 10*3/uL (ref 4.0–10.5)
nRBC: 0 % (ref 0.0–0.2)

## 2023-08-22 NOTE — Progress Notes (Signed)
 Physical Therapy Treatment Patient Details Name: Janet Peterson MRN: [id number] DOB: 1950/10/17 Today's Date: 08/22/2023   History of Present Illness Pt s/p R TKR and with hx of L TKR, OCD, RA, ankylosing spondylitis, and spinal fixation T4 - L1    PT Comments  Pt continues cooperative but requiring increased time for all tasks and increased assist for bed mobility.  Pt up to EOB sitting, to standing and up to ambulate limited distance in halls.  Pt continues limited by pain and fatiguing easily with activity.    If plan is discharge home, recommend the following: A little help with walking and/or transfers;A little help with bathing/dressing/bathroom;Assistance with cooking/housework;Assist for transportation;Help with stairs or ramp for entrance   Can travel by private vehicle        Equipment Recommendations  None recommended by PT    Recommendations for Other Services       Precautions / Restrictions Precautions Precautions: Knee;Fall Required Braces or Orthoses: Knee Immobilizer - Right Knee Immobilizer - Right: Discontinue once straight leg raise with < 10 degree lag Restrictions Weight Bearing Restrictions Per Provider Order: No RLE Weight Bearing Per Provider Order: Weight bearing as tolerated     Mobility  Bed Mobility Overal bed mobility: Needs Assistance Bed Mobility: Sit to Supine     Supine to sit: Mod assist     General bed mobility comments: increased time with cues for sequence and use of L LE to self assist.  Physical assist to manage Bil LEs, to control trunk, and to complete rotation to EOB sitting using bed pad    Transfers Overall transfer level: Needs assistance Equipment used: Rolling walker (2 wheels) Transfers: Sit to/from Stand Sit to Stand: Min assist, From elevated surface           General transfer comment: cues for LE management and use of UEs to self assist    Ambulation/Gait Ambulation/Gait assistance: Min assist Gait Distance  (Feet): 34 Feet Assistive device: Rolling walker (2 wheels) Gait Pattern/deviations: Step-to pattern, Decreased step length - right, Decreased step length - left, Shuffle, Trunk flexed Gait velocity: decr     General Gait Details: cues for sequence, posture and position from [employer]             Wheelchair Mobility     Tilt Bed    Modified Rankin (Stroke Patients Only)       Balance Overall balance assessment: Needs assistance Sitting-balance support: No upper extremity supported, Feet supported Sitting balance-Leahy Scale: Good     Standing balance support: Bilateral upper extremity supported Standing balance-Leahy Scale: Poor                              Communication Communication Communication: No apparent difficulties  Cognition Arousal: Alert Behavior During Therapy: WFL for tasks assessed/performed   PT - Cognitive impairments: No apparent impairments                         Following commands: Intact      Cueing Cueing Techniques: Verbal cues  Exercises Total Joint Exercises Ankle Circles/Pumps: AROM, Both, 15 reps, Supine Quad Sets: AROM, Both, 10 reps, Supine Heel Slides: AAROM, 15 reps, Right, Supine Straight Leg Raises: AAROM, Right, 10 reps, Supine Goniometric ROM: AAROM at R knee -5 - 40    General Comments        Pertinent Vitals/Pain Pain Assessment Pain  Assessment: 0-10 Pain Score: 7  Pain Location: R knee Pain Descriptors / Indicators: Aching, Sore Pain Intervention(s): Limited activity within patient's tolerance, Monitored during session, Premedicated before session (pt declines ice)    Home Living                          Prior Function            PT Goals (current goals can now be found in the care plan section) Acute Rehab PT Goals Patient Stated Goal: Regain IND PT Goal Formulation: With patient Time For Goal Achievement: 08/26/23 Potential to Achieve Goals: Good Progress  towards PT goals: Progressing toward goals    Frequency    7X/week      PT Plan      Co-evaluation              AM-PAC PT "6 Clicks" Mobility   Outcome Measure  Help needed turning from your back to your side while in a flat bed without using bedrails?: A Lot Help needed moving from lying on your back to sitting on the side of a flat bed without using bedrails?: A Little Help needed moving to and from a bed to a chair (including a wheelchair)?: A Lot Help needed standing up from a chair using your arms (e.g., wheelchair or bedside chair)?: A Little Help needed to walk in hospital room?: A Little Help needed climbing 3-5 steps with a railing? : A Lot 6 Click Score: 15    End of Session Equipment Utilized During Treatment: Gait belt Activity Tolerance: Patient limited by pain;Patient limited by fatigue Patient left: in chair;with call bell/phone within reach;with chair alarm set;with family/visitor present Nurse Communication: Mobility status PT Visit Diagnosis: Difficulty in walking, not elsewhere classified (R26.2)     Time: 4098-1191 PT Time Calculation (min) (ACUTE ONLY): 37 min  Charges:    $Gait Training: 8-22 mins $Therapeutic Exercise: 8-22 mins $Therapeutic Activity: 8-22 mins PT General Charges $$ ACUTE PT VISIT: 1 Visit                     Janet Peterson PT Acute Rehabilitation Services Pager [phone] Office [phone]    Janet Peterson 08/22/2023, 1:12 PM

## 2023-08-22 NOTE — Plan of Care (Signed)
  Problem: Coping: Goal: Level of anxiety will decrease Outcome: Progressing   Problem: Elimination: Goal: Will not experience complications related to urinary retention Outcome: Progressing   Problem: Pain Management: Goal: Pain level will decrease with appropriate interventions Outcome: Progressing

## 2023-08-22 NOTE — Plan of Care (Signed)
  Problem: Coping: Goal: Level of anxiety will decrease Outcome: Progressing   Problem: Safety: Goal: Ability to remain free from injury will improve Outcome: Progressing   Problem: Activity: Goal: Range of joint motion will improve Outcome: Progressing   Problem: Pain Management: Goal: Pain level will decrease with appropriate interventions Outcome: Progressing

## 2023-08-22 NOTE — Progress Notes (Signed)
 Physical Therapy Treatment Patient Details Name: Janet Peterson MRN: [id number] DOB: 06/08/1950 Today's Date: 08/22/2023   History of Present Illness Pt s/p R TKR and with hx of L TKR, OCD, RA, ankylosing spondylitis, and spinal fixation T4 - L1    PT Comments  Pt continues cooperative but progressing slowly with mobility and struggling with bed mobility.  Pt hopeful for dc home tomorrow.    If plan is discharge home, recommend the following: A little help with walking and/or transfers;A little help with bathing/dressing/bathroom;Assistance with cooking/housework;Assist for transportation;Help with stairs or ramp for entrance   Can travel by private vehicle        Equipment Recommendations  None recommended by PT    Recommendations for Other Services       Precautions / Restrictions Precautions Precautions: Knee;Fall Required Braces or Orthoses: Knee Immobilizer - Right Knee Immobilizer - Right: Discontinue once straight leg raise with < 10 degree lag Restrictions Weight Bearing Restrictions Per Provider Order: No RLE Weight Bearing Per Provider Order: Weight bearing as tolerated     Mobility  Bed Mobility Overal bed mobility: Needs Assistance Bed Mobility: Supine to Sit, Sit to Supine     Supine to sit: Mod assist Sit to supine: Mod assist   General bed mobility comments: increased time with cues for sequence and use of L LE to self assist.  Physical assist to manage Bil LEs, to control trunk, and to complete rotation using bed pad    Transfers Overall transfer level: Needs assistance Equipment used: Rolling walker (2 wheels) Transfers: Sit to/from Stand Sit to Stand: Min assist, From elevated surface           General transfer comment: cues for LE management and use of UEs to self assist    Ambulation/Gait Ambulation/Gait assistance: Min assist Gait Distance (Feet): 48 Feet Assistive device: Rolling walker (2 wheels) Gait Pattern/deviations: Step-to pattern,  Decreased step length - right, Decreased step length - left, Shuffle, Trunk flexed Gait velocity: decr     General Gait Details: Increased time with cues for sequence, posture, stride length and position from [employer]             Wheelchair Mobility     Tilt Bed    Modified Rankin (Stroke Patients Only)       Balance Overall balance assessment: Needs assistance Sitting-balance support: No upper extremity supported, Feet supported Sitting balance-Leahy Scale: Good     Standing balance support: Bilateral upper extremity supported Standing balance-Leahy Scale: Poor                              Communication Communication Communication: No apparent difficulties  Cognition Arousal: Alert Behavior During Therapy: WFL for tasks assessed/performed   PT - Cognitive impairments: No apparent impairments                         Following commands: Intact      Cueing Cueing Techniques: Verbal cues  Exercises Total Joint Exercises Ankle Circles/Pumps: AROM, Both, 15 reps, Supine Quad Sets: AROM, Both, 10 reps, Supine Heel Slides: AAROM, 15 reps, Right, Supine Straight Leg Raises: AAROM, Right, 10 reps, Supine Goniometric ROM: AAROM at R knee -5 - 40    General Comments        Pertinent Vitals/Pain Pain Assessment Pain Assessment: 0-10 Pain Score: 6  Pain Location: R knee Pain Descriptors / Indicators: Aching,  Sore Pain Intervention(s): Limited activity within patient's tolerance, Monitored during session, Premedicated before session, Ice applied    Home Living                          Prior Function            PT Goals (current goals can now be found in the care plan section) Acute Rehab PT Goals Patient Stated Goal: Regain IND PT Goal Formulation: With patient Time For Goal Achievement: 08/26/23 Potential to Achieve Goals: Good Progress towards PT goals: Progressing toward goals    Frequency     7X/week      PT Plan      Co-evaluation              AM-PAC PT "6 Clicks" Mobility   Outcome Measure  Help needed turning from your back to your side while in a flat bed without using bedrails?: A Lot Help needed moving from lying on your back to sitting on the side of a flat bed without using bedrails?: A Little Help needed moving to and from a bed to a chair (including a wheelchair)?: A Lot Help needed standing up from a chair using your arms (e.g., wheelchair or bedside chair)?: A Little Help needed to walk in hospital room?: A Little Help needed climbing 3-5 steps with a railing? : A Lot 6 Click Score: 15    End of Session Equipment Utilized During Treatment: Gait belt Activity Tolerance: Patient limited by pain;Patient limited by fatigue Patient left: with call bell/phone within reach;with family/visitor present;in bed;with bed alarm set Nurse Communication: Mobility status PT Visit Diagnosis: Difficulty in walking, not elsewhere classified (R26.2)     Time: 4098-1191 PT Time Calculation (min) (ACUTE ONLY): 35 min  Charges:    $Gait Training: 8-22 mins $Therapeutic Exercise: 8-22 mins $Therapeutic Activity: 8-22 mins PT General Charges $$ ACUTE PT VISIT: 1 Visit                     Mauro Kaufmann PT Acute Rehabilitation Services Pager [phone] Office [phone]    Merryn Thaker 08/22/2023, 3:22 PM

## 2023-08-22 NOTE — Discharge Summary (Signed)
 Physician Discharge Summary  Patient ID: Janet Peterson MRN: [id number] DOB/AGE: [birth year] 73 y.o.  Admit date: 08/20/2023 Discharge date: 08/22/2023  Admission Diagnoses: R knee OA; hx of OCD, GERD, HTN, syncope, PVC, fibromyalgia, LBP, psoriatic OA, osteopenia, hyperlipidemia, restless leg, insomnia, DDD, colitis, anxiety and depression, C. Diff, lesion of liver, conjunctivitis, sinusitis, dyspnea on exertion.  Discharge Diagnoses:  Principal Problem:   Status post total knee replacement, right Same as above  Discharged Condition: stable  Hospital Course: Patient presented to [facility] OR for elective R TKA by Dr. Ranell Patrick on 08/20/23.  Patient tolerated the procedure well without complication.  She was then admitted to the hospital.  She worked well with therapy.  She tolerated her stay well without incident.  She is to be D/C'd home with HHPT.  Consults:  PT/OT  Significant Diagnostic Studies: N/A  Treatments: IV hydration, antibiotics: Ancef, analgesia: acetaminophen, Dilaudid, and oxycodone, cardiac meds: amlodipine, anticoagulation: ASA, and surgery: as stated above.  Discharge Exam: Blood pressure (!) 163/92, pulse (!) 106, temperature 98.1 F (36.7 C), temperature source Oral, resp. rate 16, height 5\' 3"  (1.6 m), weight 83.5 kg, SpO2 96%. General: WDWN patient in NAD. Psych:  Appropriate mood and affect. Neuro:  A&O x 3, Moving all extremities, sensation intact to light touch HEENT:  EOMs intact Chest:  Even non-labored respirations Skin:  Aquacel dressing C/D/I, no rashes or lesions Extremities: warm/dry, mild edema to R knee, no erythema or echymosis.  No lymphadenopathy. Pulses: Popliteus 2+ MSK:  ROM: lacks 10 degrees TKE, MMT: able to perform quad set, (-) Homan's   Disposition: Discharge disposition: 01-Home or Self Care       Discharge Instructions     Call MD / Call 911   Complete by: As directed    If you experience chest pain or shortness of breath, CALL 911 and  be transported to the hospital emergency room.  If you develope a fever above 101 F, pus (white drainage) or increased drainage or redness at the wound, or calf pain, call your surgeon's office.   Constipation Prevention   Complete by: As directed    Drink plenty of fluids.  Prune juice may be helpful.  You may use a stool softener, such as Colace (over the counter) 100 mg twice a day.  Use MiraLax (over the counter) for constipation as needed.   Diet - low sodium heart healthy   Complete by: As directed    Discharge patient   Complete by: As directed    Discharge disposition: 01-Home or Self Care   Discharge patient date: 08/21/2023   Increase activity slowly as tolerated   Complete by: As directed    Post-operative opioid taper instructions:   Complete by: As directed    POST-OPERATIVE OPIOID TAPER INSTRUCTIONS: It is important to wean off of your opioid medication as soon as possible. If you do not need pain medication after your surgery it is ok to stop day one. Opioids include: Codeine, Hydrocodone(Norco, Vicodin), Oxycodone(Percocet, oxycontin) and hydromorphone amongst others.  Long term and even short term use of opiods can cause: Increased pain response Dependence Constipation Depression Respiratory depression And more.  Withdrawal symptoms can include Flu like symptoms Nausea, vomiting And more Techniques to manage these symptoms Hydrate well Eat regular healthy meals Stay active Use relaxation techniques(deep breathing, meditating, yoga) Do Not substitute Alcohol to help with tapering If you have been on opioids for less than two weeks and do not have pain than it is ok  to stop all together.  Plan to wean off of opioids This plan should start within one week post op of your joint replacement. Maintain the same interval or time between taking each dose and first decrease the dose.  Cut the total daily intake of opioids by one tablet each day Next start to increase the  time between doses. The last dose that should be eliminated is the evening dose.      Weight bearing as tolerated   Complete by: As directed    Laterality: right   Extremity: Lower      Allergies as of 08/22/2023       Reactions   Latex Rash   Mouth sores   Amlodipine Besy-benazepril Hcl Cough   No angioedema   Amoxicillin-pot Clavulanate Diarrhea, Nausea And Vomiting   Bextra [valdecoxib] Diarrhea, Nausea And Vomiting   Acid reflux   Hydrochlorothiazide    Low sodium   Lipitor [atorvastatin Calcium] Other (See Comments)   MYALGIAS   Spironolactone    Hyponatremia   Sulfa Antibiotics Nausea And Vomiting   Cramping and stomach hurts   Zocor [simvastatin] Other (See Comments)   MYALGIA   Chlorhexidine Rash   Rash all over body. Should not take topical or mouthwash        Medication List     STOP taking these medications    diazepam 5 MG tablet Commonly known as: VALIUM       TAKE these medications    albuterol 108 (90 Base) MCG/ACT inhaler Commonly known as: VENTOLIN HFA TAKE 2 PUFFS BY MOUTH EVERY 6 HOURS AS NEEDED FOR WHEEZE OR SHORTNESS OF BREATH   amLODipine 5 MG tablet Commonly known as: NORVASC Take 1 tablet (5 mg total) by mouth daily.   aspirin 81 MG chewable tablet Commonly known as: Aspirin Childrens Chew 1 tablet (81 mg total) by mouth 2 (two) times daily.   Breztri Aerosphere 160-9-4.8 MCG/ACT Aero Generic drug: budeson-glycopyrrolate-formoterol Inhale 2 puffs into the lungs in the morning and at bedtime.   buPROPion 300 MG 24 hr tablet Commonly known as: WELLBUTRIN XL Take 300 mg by mouth every morning.   clonazePAM 1 MG tablet Commonly known as: KLONOPIN TAKE 1 TABLET BY MOUTH EVERY DAY AS NEEDED FOR ANXIETY   clotrimazole 1 % cream Commonly known as: Lotrimin AF Apply 1 Application topically 2 (two) times daily.   dexlansoprazole 60 MG capsule Commonly known as: DEXILANT TAKE 1 CAPSULE BY MOUTH EVERY DAY   doxepin 50 MG  capsule Commonly known as: SINEQUAN Take 100 mg by mouth at bedtime.   ezetimibe 10 MG tablet Commonly known as: ZETIA TAKE 1 TABLET BY MOUTH EVERY DAY   FLUoxetine 20 MG capsule Commonly known as: PROZAC Take 60 mg by mouth at bedtime.   fluticasone 50 MCG/ACT nasal spray Commonly known as: FLONASE Place 2 sprays into both nostrils daily as needed for allergies.   Ingrezza 80 MG capsule Generic drug: valbenazine Take one capsule by mouth daily What changed:  how much to take how to take this   leflunomide 20 MG tablet Commonly known as: ARAVA TAKE 1 TABLET BY MOUTH EVERY DAY   Linzess 72 MCG capsule Generic drug: linaclotide TAKE 1 CAPSULE BY MOUTH DAILY BEFORE BREAKFAST. What changed: See the new instructions.   methocarbamol 500 MG tablet Commonly known as: ROBAXIN Take 1 tablet (500 mg total) by mouth every 8 (eight) hours as needed for muscle spasms.   metoprolol succinate 50 MG 24 hr tablet  Commonly known as: TOPROL-XL TAKE 1 TABLET BY MOUTH DAILY WITH A MEAL. What changed: additional instructions   ondansetron 4 MG tablet Commonly known as: ZOFRAN Take 1 tablet (4 mg total) by mouth 4 (four) times daily as needed for nausea. What changed: Another medication with the same name was added. Make sure you understand how and when to take each.   ondansetron 4 MG tablet Commonly known as: Zofran Take 1 tablet (4 mg total) by mouth every 8 (eight) hours as needed. What changed: You were already taking a medication with the same name, and this prescription was added. Make sure you understand how and when to take each.   oxyCODONE-acetaminophen 5-325 MG tablet Commonly known as: Percocet Take 1 tablet by mouth every 8 (eight) hours as needed for severe pain (pain score 7-10). Ok to fill after 01/01/23 What changed: Another medication with the same name was removed. Continue taking this medication, and follow the directions you see here.   REFRESH P.M. OP Place 1  drop into both eyes every 6 (six) hours as needed (dry eyes).   Repatha SureClick 140 MG/ML Soaj Generic drug: Evolocumab Inject 140 mg into the skin every 14 (fourteen) days.   SEMAGLUTIDE(0.25 OR 0.5MG /DOS) Gorst Inject 1 mg into the skin once a week.   valsartan 320 MG tablet Commonly known as: DIOVAN Take 1 tablet (320 mg total) by mouth daily.               Discharge Care Instructions  (From admission, onward)           Start     Ordered   08/22/23 0000  Weight bearing as tolerated       Question Answer Comment  Laterality right   Extremity Lower      08/22/23 0818            Follow-up Information     Beverely Low, MD. Call in 2 week(s).   Specialty: Orthopedic Surgery Why: call [phone] for appt in two weeks Contact information: [address] [place] [place] [address] [phone]                 Signed: Lolly Mustache Office:  [phone]

## 2023-08-22 NOTE — Plan of Care (Signed)

## 2023-08-22 NOTE — Progress Notes (Signed)
 Subjective: 2 Days Post-Op Procedure(s) (LRB): ARTHROPLASTY, KNEE, TOTAL (Right)  Patient reports pain as moderate.  States that pain kept her up last night.  Denies fever, chills, N/V, CP, SOB.  Tolerating POs well. Admits to flatus.  Objective:   VITALS:  Temp:  [98.1 F (36.7 C)-98.9 F (37.2 C)] 98.1 F (36.7 C) (03/30 0537) Pulse Rate:  [76-106] 106 (03/30 0537) Resp:  [16-18] 16 (03/30 0537) BP: (130-169)/(79-96) 163/92 (03/30 0537) SpO2:  [87 %-96 %] 96 % (03/30 0537)  General: WDWN patient in NAD. Psych:  Appropriate mood and affect. Neuro:  A&O x 3, Moving all extremities, sensation intact to light touch HEENT:  EOMs intact Chest:  Even non-labored respirations Skin:  Aquacel dressing C/D/I, no rashes or lesions Extremities: warm/dry, mild edema to R knee, no erythema or echymosis.  No lymphadenopathy. Pulses: Popliteus 2+ MSK:  ROM: lacks 10 degrees TKE, MMT: able to perform quad set, (-) Homan's    LABS Recent Labs    08/21/23 0331 08/22/23 0324  HGB 9.8* 10.9*  WBC 9.0 9.9  PLT 188 189   Recent Labs    08/21/23 0331  NA 136  K 3.6  CL 103  CO2 25  BUN 11  CREATININE 0.91  GLUCOSE 138*   No results for input(s): "LABPT", "INR" in the last 72 hours.   Assessment/Plan: 2 Days Post-Op Procedure(s) (LRB): ARTHROPLASTY, KNEE, TOTAL (Right)  Patient seen in rounds for Dr. Ranell Patrick. WBAT R LE with walker Up with therapy D/C home today if cleared by PT.  D/C order placed. Plan for outpatient post-op visit with Dr. Ranell Patrick.   Alfredo Martinez PA-C [facility] Office:  [phone]

## 2023-08-22 NOTE — Progress Notes (Signed)
 Physical Therapy Treatment Patient Details Name: Janet Peterson MRN: [id number] DOB: May 20, 1951 Today's Date: 08/22/2023   History of Present Illness Pt s/p R TKR and with hx of L TKR, OCD, RA, ankylosing spondylitis, and spinal fixation T4 - L1    PT Comments  Pt sitting on EOB with CNA on arrival to room and struggling with return to bed following toileting on BSC.  With increased time and cues pt assisted to bed with mod assist to manage LEs.  Pt performed therex program with assist.  Pt requesting rest break prior to attempting further mobility.  Will follow up with mobility.   2 If plan is discharge home, recommend the following: A little help with walking and/or transfers;A little help with bathing/dressing/bathroom;Assistance with cooking/housework;Assist for transportation;Help with stairs or ramp for entrance   Can travel by private vehicle        Equipment Recommendations  None recommended by PT    Recommendations for Other Services       Precautions / Restrictions Precautions Precautions: Knee;Fall Required Braces or Orthoses: Knee Immobilizer - Right Knee Immobilizer - Right: Discontinue once straight leg raise with < 10 degree lag Restrictions Weight Bearing Restrictions Per Provider Order: No RLE Weight Bearing Per Provider Order: Weight bearing as tolerated     Mobility  Bed Mobility Overal bed mobility: Needs Assistance Bed Mobility: Sit to Supine     Supine to sit: Mod assist     General bed mobility comments: increased time with cues for sequence and use of L LE to self assist.  Physical assist to manage Bil LEs and to bring trunk    Transfers                        Ambulation/Gait                   Stairs             Wheelchair Mobility     Tilt Bed    Modified Rankin (Stroke Patients Only)       Balance Overall balance assessment: Needs assistance Sitting-balance support: No upper extremity supported, Feet  supported Sitting balance-Leahy Scale: Good                                      Communication Communication Communication: No apparent difficulties  Cognition Arousal: Alert Behavior During Therapy: WFL for tasks assessed/performed   PT - Cognitive impairments: No apparent impairments                         Following commands: Intact      Cueing Cueing Techniques: Verbal cues  Exercises Total Joint Exercises Ankle Circles/Pumps: AROM, Both, 15 reps, Supine Quad Sets: AROM, Both, 10 reps, Supine Heel Slides: AAROM, 15 reps, Right, Supine Straight Leg Raises: AAROM, Right, 10 reps, Supine Goniometric ROM: AAROM at R knee -5 - 40    General Comments        Pertinent Vitals/Pain Pain Assessment Pain Assessment: 0-10 Pain Score: 8  Pain Location: R knee Pain Descriptors / Indicators: Aching, Sore Pain Intervention(s): Limited activity within patient's tolerance, Monitored during session, Premedicated before session    Home Living                          Prior Function  PT Goals (current goals can now be found in the care plan section) Acute Rehab PT Goals Patient Stated Goal: Regain IND PT Goal Formulation: With patient Time For Goal Achievement: 08/26/23 Potential to Achieve Goals: Good Progress towards PT goals: Not progressing toward goals - comment (fatigue limited)    Frequency    7X/week      PT Plan      Co-evaluation              AM-PAC PT "6 Clicks" Mobility   Outcome Measure  Help needed turning from your back to your side while in a flat bed without using bedrails?: A Lot Help needed moving from lying on your back to sitting on the side of a flat bed without using bedrails?: A Little Help needed moving to and from a bed to a chair (including a wheelchair)?: A Lot Help needed standing up from a chair using your arms (e.g., wheelchair or bedside chair)?: A Little Help needed to walk in  hospital room?: A Little Help needed climbing 3-5 steps with a railing? : A Lot 6 Click Score: 15    End of Session Equipment Utilized During Treatment: Gait belt Activity Tolerance: Patient limited by pain;Patient limited by fatigue Patient left: in bed;with call bell/phone within reach;with bed alarm set;with family/visitor present Nurse Communication: Mobility status PT Visit Diagnosis: Difficulty in walking, not elsewhere classified (R26.2)     Time: 1000-1025 PT Time Calculation (min) (ACUTE ONLY): 25 min  Charges:    $Therapeutic Exercise: 8-22 mins $Therapeutic Activity: 8-22 mins PT General Charges $$ ACUTE PT VISIT: 1 Visit                     Mauro Kaufmann PT Acute Rehabilitation Services Pager [phone] Office [phone]    Janet Peterson 08/22/2023, 1:05 PM

## 2023-08-23 ENCOUNTER — Encounter (HOSPITAL_COMMUNITY): Payer: Self-pay | Admitting: Orthopedic Surgery

## 2023-08-23 DIAGNOSIS — M1711 Unilateral primary osteoarthritis, right knee: Secondary | ICD-10-CM | POA: Diagnosis not present

## 2023-08-23 NOTE — Discharge Summary (Signed)
 In most cases prophylactic antibiotics for Dental procdeures after total joint surgery are not necessary.  Exceptions are as follows:  1. History of prior total joint infection  2. Severely immunocompromised (Organ Transplant, cancer chemotherapy, Rheumatoid biologic meds such as Humera)  3. Poorly controlled diabetes (A1C &gt; 8.0, blood glucose over 200)  If you have one of these conditions, contact your surgeon for an antibiotic prescription, prior to your dental procedure. Orthopedic Discharge Summary        Physician Discharge Summary  Patient ID: Janet Peterson MRN: [id number] DOB/AGE: 1951-04-24 73 y.o.  Admit date: 08/20/2023 Discharge date: 08/23/2023   Procedures:  Procedure(s) (LRB): ARTHROPLASTY, KNEE, TOTAL (Right)  Attending Physician:  Dr. Malon Kindle  Admission Diagnoses:   right knee end stage osteoarthritis  Discharge Diagnoses:  right knee end stage osteoarthritis   Past Medical History:  Diagnosis Date   Anemia    Ankylosing spondylitis (HCC)    Anxiety and depression    Cancer (HCC)    basal cell skin cancer   Depression    Dyspnea    Fibromyalgia    GERD (gastroesophageal reflux disease)    Hiatal hernia    per patient, dx by GI    History of basal cell carcinoma excision    FACE, 1992  &  1996   History of hiatal hernia    History of thrush    Hyperlipidemia    Hypertension    Insomnia    Left breast mass    OCD (obsessive compulsive disorder)    Osteopenia    PONV (postoperative nausea and vomiting)    Psoriatic arthritis (HCC)    PVC (premature ventricular contraction)    Rheumatoid arthritis (HCC)     PCP: Donita Brooks, MD   Discharged Condition: good  Hospital Course:  Patient underwent the above stated procedure on 08/20/2023. Patient tolerated the procedure well and brought to the recovery room in good condition and subsequently to the floor. Patient had an uncomplicated hospital course and was stable for  discharge.   Disposition: Discharge disposition: 01-Home or Self Care      with follow up in 2 weeks    Follow-up Information     Beverely Low, MD. Call in 2 week(s).   Specialty: Orthopedic Surgery Why: call [phone] for appt in two weeks Contact information: [address] [place] [address] [phone]                 Dental Antibiotics:  In most cases prophylactic antibiotics for Dental procdeures after total joint surgery are not necessary.  Exceptions are as follows:  1. History of prior total joint infection  2. Severely immunocompromised (Organ Transplant, cancer chemotherapy, Rheumatoid biologic meds such as Humera)  3. Poorly controlled diabetes (A1C &gt; 8.0, blood glucose over 200)  If you have one of these conditions, contact your surgeon for an antibiotic prescription, prior to your dental procedure.  Discharge Instructions     Call MD / Call 911   Complete by: As directed    If you experience chest pain or shortness of breath, CALL 911 and be transported to the hospital emergency room.  If you develope a fever above 101 F, pus (white drainage) or increased drainage or redness at the wound, or calf pain, call your surgeon's office.   Call MD / Call 911   Complete by: As directed    If you experience chest pain or shortness of breath, CALL 911  and be transported to the hospital emergency room.  If you develope a fever above 101 F, pus (white drainage) or increased drainage or redness at the wound, or calf pain, call your surgeon's office.   Constipation Prevention   Complete by: As directed    Drink plenty of fluids.  Prune juice may be helpful.  You may use a stool softener, such as Colace (over the counter) 100 mg twice a day.  Use MiraLax (over the counter) for constipation as needed.   Constipation Prevention   Complete by: As directed    Drink plenty of fluids.  Prune juice may be helpful.  You may use a stool  softener, such as Colace (over the counter) 100 mg twice a day.  Use MiraLax (over the counter) for constipation as needed.   Diet - low sodium heart healthy   Complete by: As directed    Diet - low sodium heart healthy   Complete by: As directed    Discharge patient   Complete by: As directed    Discharge disposition: 01-Home or Self Care   Discharge patient date: 08/21/2023   Increase activity slowly as tolerated   Complete by: As directed    Increase activity slowly as tolerated   Complete by: As directed    Post-operative opioid taper instructions:   Complete by: As directed    POST-OPERATIVE OPIOID TAPER INSTRUCTIONS: It is important to wean off of your opioid medication as soon as possible. If you do not need pain medication after your surgery it is ok to stop day one. Opioids include: Codeine, Hydrocodone(Norco, Vicodin), Oxycodone(Percocet, oxycontin) and hydromorphone amongst others.  Long term and even short term use of opiods can cause: Increased pain response Dependence Constipation Depression Respiratory depression And more.  Withdrawal symptoms can include Flu like symptoms Nausea, vomiting And more Techniques to manage these symptoms Hydrate well Eat regular healthy meals Stay active Use relaxation techniques(deep breathing, meditating, yoga) Do Not substitute Alcohol to help with tapering If you have been on opioids for less than two weeks and do not have pain than it is ok to stop all together.  Plan to wean off of opioids This plan should start within one week post op of your joint replacement. Maintain the same interval or time between taking each dose and first decrease the dose.  Cut the total daily intake of opioids by one tablet each day Next start to increase the time between doses. The last dose that should be eliminated is the evening dose.      Post-operative opioid taper instructions:   Complete by: As directed    POST-OPERATIVE OPIOID TAPER  INSTRUCTIONS: It is important to wean off of your opioid medication as soon as possible. If you do not need pain medication after your surgery it is ok to stop day one. Opioids include: Codeine, Hydrocodone(Norco, Vicodin), Oxycodone(Percocet, oxycontin) and hydromorphone amongst others.  Long term and even short term use of opiods can cause: Increased pain response Dependence Constipation Depression Respiratory depression And more.  Withdrawal symptoms can include Flu like symptoms Nausea, vomiting And more Techniques to manage these symptoms Hydrate well Eat regular healthy meals Stay active Use relaxation techniques(deep breathing, meditating, yoga) Do Not substitute Alcohol to help with tapering If you have been on opioids for less than two weeks and do not have pain than it is ok to stop all together.  Plan to wean off of opioids This plan should start within one week post op  of your joint replacement. Maintain the same interval or time between taking each dose and first decrease the dose.  Cut the total daily intake of opioids by one tablet each day Next start to increase the time between doses. The last dose that should be eliminated is the evening dose.      Weight bearing as tolerated   Complete by: As directed    Laterality: right   Extremity: Lower       Allergies as of 08/23/2023       Reactions   Latex Rash   Mouth sores   Amlodipine Besy-benazepril Hcl Cough   No angioedema   Amoxicillin-pot Clavulanate Diarrhea, Nausea And Vomiting   Bextra [valdecoxib] Diarrhea, Nausea And Vomiting   Acid reflux   Hydrochlorothiazide    Low sodium   Lipitor [atorvastatin Calcium] Other (See Comments)   MYALGIAS   Spironolactone    Hyponatremia   Sulfa Antibiotics Nausea And Vomiting   Cramping and stomach hurts   Zocor [simvastatin] Other (See Comments)   MYALGIA   Chlorhexidine Rash   Rash all over body. Should not take topical or mouthwash         Medication List     STOP taking these medications    diazepam 5 MG tablet Commonly known as: VALIUM       TAKE these medications    albuterol 108 (90 Base) MCG/ACT inhaler Commonly known as: VENTOLIN HFA TAKE 2 PUFFS BY MOUTH EVERY 6 HOURS AS NEEDED FOR WHEEZE OR SHORTNESS OF BREATH   amLODipine 5 MG tablet Commonly known as: NORVASC Take 1 tablet (5 mg total) by mouth daily.   aspirin 81 MG chewable tablet Commonly known as: Aspirin Childrens Chew 1 tablet (81 mg total) by mouth 2 (two) times daily.   Breztri Aerosphere 160-9-4.8 MCG/ACT Aero Generic drug: budeson-glycopyrrolate-formoterol Inhale 2 puffs into the lungs in the morning and at bedtime.   buPROPion 300 MG 24 hr tablet Commonly known as: WELLBUTRIN XL Take 300 mg by mouth every morning.   clonazePAM 1 MG tablet Commonly known as: KLONOPIN TAKE 1 TABLET BY MOUTH EVERY DAY AS NEEDED FOR ANXIETY   clotrimazole 1 % cream Commonly known as: Lotrimin AF Apply 1 Application topically 2 (two) times daily.   dexlansoprazole 60 MG capsule Commonly known as: DEXILANT TAKE 1 CAPSULE BY MOUTH EVERY DAY   doxepin 50 MG capsule Commonly known as: SINEQUAN Take 100 mg by mouth at bedtime.   ezetimibe 10 MG tablet Commonly known as: ZETIA TAKE 1 TABLET BY MOUTH EVERY DAY   FLUoxetine 20 MG capsule Commonly known as: PROZAC Take 60 mg by mouth at bedtime.   fluticasone 50 MCG/ACT nasal spray Commonly known as: FLONASE Place 2 sprays into both nostrils daily as needed for allergies.   Ingrezza 80 MG capsule Generic drug: valbenazine Take one capsule by mouth daily What changed:  how much to take how to take this   leflunomide 20 MG tablet Commonly known as: ARAVA TAKE 1 TABLET BY MOUTH EVERY DAY   Linzess 72 MCG capsule Generic drug: linaclotide TAKE 1 CAPSULE BY MOUTH DAILY BEFORE BREAKFAST. What changed: See the new instructions.   methocarbamol 500 MG tablet Commonly known as: ROBAXIN Take  1 tablet (500 mg total) by mouth every 8 (eight) hours as needed for muscle spasms.   metoprolol succinate 50 MG 24 hr tablet Commonly known as: TOPROL-XL TAKE 1 TABLET BY MOUTH DAILY WITH A MEAL. What changed: additional instructions  ondansetron 4 MG tablet Commonly known as: ZOFRAN Take 1 tablet (4 mg total) by mouth 4 (four) times daily as needed for nausea. What changed: Another medication with the same name was added. Make sure you understand how and when to take each.   ondansetron 4 MG tablet Commonly known as: Zofran Take 1 tablet (4 mg total) by mouth every 8 (eight) hours as needed. What changed: You were already taking a medication with the same name, and this prescription was added. Make sure you understand how and when to take each.   oxyCODONE-acetaminophen 5-325 MG tablet Commonly known as: Percocet Take 1 tablet by mouth every 8 (eight) hours as needed for severe pain (pain score 7-10). Ok to fill after 01/01/23 What changed: Another medication with the same name was removed. Continue taking this medication, and follow the directions you see here.   REFRESH P.M. OP Place 1 drop into both eyes every 6 (six) hours as needed (dry eyes).   Repatha SureClick 140 MG/ML Soaj Generic drug: Evolocumab Inject 140 mg into the skin every 14 (fourteen) days.   SEMAGLUTIDE(0.25 OR 0.5MG /DOS) [place] Inject 1 mg into the skin once a week.   valsartan 320 MG tablet Commonly known as: DIOVAN Take 1 tablet (320 mg total) by mouth daily.               Durable Medical Equipment  (From admission, onward)           Start     Ordered   08/22/23 0937  For home use only DME Walker rolling  Once       Question Answer Comment  Walker: With 5 Inch Wheels   Patient needs a walker to treat with the following condition Status post right unicompartmental knee replacement      08/22/23 0936              Discharge Care Instructions  (From admission, onward)            Start     Ordered   08/22/23 0000  Weight bearing as tolerated       Question Answer Comment  Laterality right   Extremity Lower      08/22/23 0818              Signed: Thea Gist 08/23/2023, 9:16 AM  [facility] is now [employer] [address], [place], [place] [address] Phone: [phone] Facebook  [employer]

## 2023-08-23 NOTE — Progress Notes (Signed)
 Physical Therapy Treatment Patient Details Name: LEILANNY FLUITT MRN: [id number] DOB: Mar 13, 1951 Today's Date: 08/23/2023   History of Present Illness Pt s/p R TKR and with hx of L TKR, OCD, RA, ankylosing spondylitis, and spinal fixation T4 - L1    PT Comments  POD # 3 pm session with Spouse Assisted OOB using strap required increased time and effort.  Had Spouse "hands on" assist Pt with all transfers including a toilet transfer.  Pt fatigues quickly and requires rest breaks between.  Practiced stairs with Spouse was difficult.  General Gait Details: Increased time with cues for sequence, posture, stride length and position from RW.  Decreased amb distance due to performing stairs. Pt's "other" knee is weak and is limited to 90 degrees flexion.  Spouse has concerns about getting Pt to her OP PT appointments. Pt has met her mobility goals to D/C to home today.  Spouse requesting HH PT vs OP.  Relayed message to RN.    If plan is discharge home, recommend the following: A little help with walking and/or transfers;A little help with bathing/dressing/bathroom;Assistance with cooking/housework;Assist for transportation;Help with stairs or ramp for entrance   Can travel by private vehicle        Equipment Recommendations  None recommended by PT    Recommendations for Other Services       Precautions / Restrictions Precautions Precautions: Knee;Fall Precaution/Restrictions Comments: no pillow under knee Restrictions Weight Bearing Restrictions Per Provider Order: No RLE Weight Bearing Per Provider Order: Weight bearing as tolerated     Mobility  Bed Mobility Overal bed mobility: Needs Assistance Bed Mobility: Supine to Sit     Supine to sit: Contact guard Sit to supine: Min assist, Mod assist   General bed mobility comments: demonstarted and instructed how to use a strap to self assist LE off and back onto bed.    Transfers Overall transfer level: Needs assistance Equipment used:  Rolling walker (2 wheels) Transfers: Sit to/from Stand Sit to Stand: Contact guard assist, Min assist           General transfer comment: cues for LE management and use of UEs to self assist plus increased time.  Had Spouse "hand on" assist with all tranfers including a toilet transfer.    Ambulation/Gait Ambulation/Gait assistance: Contact guard assist, Min assist Gait Distance (Feet): 24 Feet Assistive device: Rolling walker (2 wheels) Gait Pattern/deviations: Step-to pattern, Decreased step length - right, Decreased step length - left, Shuffle, Trunk flexed Gait velocity: decr     General Gait Details: Increased time with cues for sequence, posture, stride length and position from RW.  Decreased amb distance due to performing stairs.   Stairs Stairs: Yes Stairs assistance: Min assist, Mod assist Stair Management: One rail Left, Step to pattern, Sideways, Forwards Number of Stairs: 2 General stair comments: with Spouse "hands on" asissted up/down 2 steps using both hands on LEFT rail requiring 75% VC's on proper sequencing.  Pt present with decreased strength and limited knee ROM on her "other" side (LEFT).   Wheelchair Mobility     Tilt Bed    Modified Rankin (Stroke Patients Only)       Balance                                            Communication Communication Communication: No apparent difficulties  Cognition Arousal: Alert Behavior During  Therapy: WFL for tasks assessed/performed   PT - Cognitive impairments: No apparent impairments                       PT - Cognition Comments: AxO x 3 pleasant and willing.  Had her other knee replaced but that was several years ago.  2007 Following commands: Intact      Cueing Cueing Techniques: Verbal cues  Exercises      General Comments        Pertinent Vitals/Pain Pain Assessment Pain Assessment: 0-10 Pain Score: 5  Pain Location: R knee Pain Descriptors / Indicators:  Aching, Sore, Operative site guarding, Tightness Pain Intervention(s): Monitored during session, Premedicated before session, Repositioned, Ice applied    Home Living                          Prior Function            PT Goals (current goals can now be found in the care plan section) Progress towards PT goals: Progressing toward goals    Frequency    7X/week      PT Plan      Co-evaluation              AM-PAC PT "6 Clicks" Mobility   Outcome Measure  Help needed turning from your back to your side while in a flat bed without using bedrails?: A Lot Help needed moving from lying on your back to sitting on the side of a flat bed without using bedrails?: A Lot Help needed moving to and from a bed to a chair (including a wheelchair)?: A Lot Help needed standing up from a chair using your arms (e.g., wheelchair or bedside chair)?: A Lot Help needed to walk in hospital room?: A Lot Help needed climbing 3-5 steps with a railing? : A Lot 6 Click Score: 12    End of Session Equipment Utilized During Treatment: Gait belt Activity Tolerance: Patient limited by pain;Patient limited by fatigue Patient left: with family/visitor present;in chair;with call bell/phone within reach Nurse Communication: Mobility status PT Visit Diagnosis: Difficulty in walking, not elsewhere classified (R26.2)     Time: 1424-1450 PT Time Calculation (min) (ACUTE ONLY): 26 min  Charges:    $Gait Training: 8-22 mins $Therapeutic Activity: 8-22 mins PT General Charges $$ ACUTE PT VISIT: 1 Visit                     Felecia Shelling  PTA Acute  Rehabilitation Services Office M-F          [phone]

## 2023-08-23 NOTE — Progress Notes (Signed)
   Subjective: 3 Days Post-Op Procedure(s) (LRB): ARTHROPLASTY, KNEE, TOTAL (Right)  C/o moderate pain to the right knee but planning on d/c after therapy today Denies any new symptoms or issues Patient reports pain as moderate.  Objective:   VITALS:   Vitals:   08/22/23 2200 08/23/23 0527  BP: (!) 145/70 (!) 150/74  Pulse: (!) 108 (!) 106  Resp: 17 18  Temp: 98.2 F (36.8 C) 98.5 F (36.9 C)  SpO2: 96% 98%    Right knee: well healing incision Dressing in place Nv intact distally No rashes  Slight edema distally  LABS Recent Labs    08/21/23 0331 08/22/23 0324  HGB 9.8* 10.9*  HCT 30.6* 34.0*  WBC 9.0 9.9  PLT 188 189    Recent Labs    08/21/23 0331  NA 136  K 3.6  BUN 11  CREATININE 0.91  GLUCOSE 138*     Assessment/Plan: 3 Days Post-Op Procedure(s) (LRB): ARTHROPLASTY, KNEE, TOTAL (Right) Finish with physical therapy today then d/c home F/u in the office in 2 weeks Pain management  Pulmonary toilet   Brad Antonieta Iba, MPAS [facility] is now [employer] [address], [address], [place], [place] [address] Phone: [phone] [email or web address] Facebook  [employer]

## 2023-08-23 NOTE — Progress Notes (Signed)
 Physical Therapy Treatment Patient Details Name: Janet Peterson MRN: [id number] DOB: [birth year] Today's Date: 08/23/2023   History of Present Illness Pt s/p R TKR and with hx of L TKR, OCD, RA, ankylosing spondylitis, and spinal fixation T4 - L1    PT Comments  POD # 3 am session PT - Cognition Comments: AxO x 3 pleasant and willing.  Had her other knee replaced but that was several years ago.  2007 Assisted OOB was difficult.  General bed mobility comments: demonstarted and instructed how to use a strap to self assist LE off and back onto bed.  Pt struggles to self perform and requires increased time and increased effort/support needed to get back into bed. General transfer comment: cues for LE management and use of UEs to self assist plus increased time  General Gait Details: Increased time with cues for sequence, posture, stride length and position from RW.  Decreased amb distance due to performing stairs. General stair comments: VC's on proper sequencing as well as proper hand placement using ONE left rail. Session completely wore Pt out, assisted back to bed to rest and performed a few TE's.  Limited AAROM knee flex 0 - 45 degrees due to pain.  Applied ICE. Will see Pt again thes afternoon for more stair training.    If plan is discharge home, recommend the following: A little help with walking and/or transfers;A little help with bathing/dressing/bathroom;Assistance with cooking/housework;Assist for transportation;Help with stairs or ramp for entrance   Can travel by private vehicle        Equipment Recommendations  None recommended by PT    Recommendations for Other Services       Precautions / Restrictions Precautions Precautions: Knee;Fall Precaution/Restrictions Comments: no pillow under knee Restrictions Weight Bearing Restrictions Per Provider Order: No RLE Weight Bearing Per Provider Order: Weight bearing as tolerated     Mobility  Bed Mobility Overal bed mobility: Needs  Assistance Bed Mobility: Supine to Sit, Sit to Supine     Supine to sit: Min assist Sit to supine: Min assist, Mod assist   General bed mobility comments: demonstarted and instructed how to use a strap to self assist LE off and back onto bed.  Pt struggles to self perform and requires increased time and increased effort/support needed to get back into bed.    Transfers Overall transfer level: Needs assistance Equipment used: Rolling walker (2 wheels) Transfers: Sit to/from Stand Sit to Stand: Min assist, From elevated surface           General transfer comment: cues for LE management and use of UEs to self assist plus increased time    Ambulation/Gait Ambulation/Gait assistance: Contact guard assist, Min assist Gait Distance (Feet): 22 Feet Assistive device: Rolling walker (2 wheels) Gait Pattern/deviations: Step-to pattern, Decreased step length - right, Decreased step length - left, Shuffle, Trunk flexed Gait velocity: decr     General Gait Details: Increased time with cues for sequence, posture, stride length and position from RW.  Decreased amb distance due to performing stairs.   Stairs Stairs: Yes Stairs assistance: Min assist Stair Management: One rail Left, Step to pattern, Sideways, Forwards Number of Stairs: 2 General stair comments: VC's on proper sequencing as well as proper hand placement using ONE left rail.   Wheelchair Mobility     Tilt Bed    Modified Rankin (Stroke Patients Only)       Balance  Communication Communication Communication: No apparent difficulties  Cognition Arousal: Alert Behavior During Therapy: WFL for tasks assessed/performed   PT - Cognitive impairments: No apparent impairments                       PT - Cognition Comments: AxO x 3 pleasant and willing.  Had her other knee replaced but that was several years ago.  2007 Following commands: Intact       Cueing Cueing Techniques: Verbal cues  Exercises  Total Knee Replacement TE's following HEP handout 10 reps B LE ankle pumps 05 reps towel squeezes 05 reps knee presses 05 reps heel slides   Educated on use of gait belt to assist with TE's Followed by ICE     General Comments        Pertinent Vitals/Pain Pain Assessment Pain Assessment: 0-10 Pain Score: 5  Pain Location: R knee Pain Descriptors / Indicators: Aching, Sore, Operative site guarding, Tightness Pain Intervention(s): Monitored during session, Premedicated before session, Repositioned, Ice applied    Home Living                          Prior Function            PT Goals (current goals can now be found in the care plan section) Progress towards PT goals: Progressing toward goals    Frequency    7X/week      PT Plan      Co-evaluation              AM-PAC PT "6 Clicks" Mobility   Outcome Measure  Help needed turning from your back to your side while in a flat bed without using bedrails?: A Lot Help needed moving from lying on your back to sitting on the side of a flat bed without using bedrails?: A Lot Help needed moving to and from a bed to a chair (including a wheelchair)?: A Lot Help needed standing up from a chair using your arms (e.g., wheelchair or bedside chair)?: A Lot Help needed to walk in hospital room?: A Lot Help needed climbing 3-5 steps with a railing? : A Lot 6 Click Score: 12    End of Session Equipment Utilized During Treatment: Gait belt Activity Tolerance: Patient limited by pain;Patient limited by fatigue Patient left: with call bell/phone within reach;with family/visitor present;in bed;with bed alarm set Nurse Communication: Mobility status PT Visit Diagnosis: Difficulty in walking, not elsewhere classified (R26.2)     Time: 1610-9604 PT Time Calculation (min) (ACUTE ONLY): 38 min  Charges:    $Gait Training: 8-22 mins $Therapeutic Exercise:  8-22 mins $Therapeutic Activity: 8-22 mins PT General Charges $$ ACUTE PT VISIT: 1 Visit                    Felecia Shelling  PTA Acute  Rehabilitation Services Office M-F          [phone]

## 2023-09-03 ENCOUNTER — Other Ambulatory Visit: Payer: Self-pay | Admitting: Family Medicine

## 2023-09-03 NOTE — Telephone Encounter (Signed)
 Copied from CRM [id number]. Topic: Clinical - Medication Refill >> Sep 03, 2023 12:32 PM Desma Mcgregor wrote: Most Recent Primary Care Visit:  Provider: Laurey Arrow  Department: [facility]  Visit Type: MEDICARE AWV, SEQUENTIAL  Date: 08/19/2023  Medication: oxyCODONE-acetaminophen (PERCOCET) 5-325 MG tablet  Has the patient contacted their pharmacy? Yes, they donot do the refills, the pt has to get a new rx.  Is this the correct pharmacy for this prescription? Yes If no, delete pharmacy and type the correct one.  This is the patient's preferred pharmacy:  [id number] [place], [place] - [address] [place] [address] Phone: [phone] Fax: [phone]   Has the prescription been filled recently? Yes  Is the patient out of the medication? Yes, been out for 2 days now  Has the patient been seen for an appointment in the last year OR does the patient have an upcoming appointment? Yes  Can we respond through MyChart? No  Agent: Please be advised that Rx refills may take up to 3 business days. We ask that you follow-up with your pharmacy.

## 2023-09-06 MED ORDER — OXYCODONE-ACETAMINOPHEN 5-325 MG PO TABS: 1.0000 | ORAL_TABLET | Freq: Three times a day (TID) | ORAL | 0 refills | Status: DC | PRN

## 2023-09-06 NOTE — Telephone Encounter (Signed)
 Requested medication (s) are due for refill today: routing for review  Requested medication (s) are on the active medication list: yes  Last refill:  08/20/23  Future visit scheduled: no  Notes to clinic:  Unable to refill per protocol, cannot delegate. Last refilled by another provider not at this practice.      Requested Prescriptions  Pending Prescriptions Disp Refills   oxyCODONE-acetaminophen (PERCOCET) 5-325 MG tablet 40 tablet 0    Sig: Take 1 tablet by mouth every 8 (eight) hours as needed for severe pain (pain score 7-10). Ok to fill after 01/01/23     Not Delegated - Analgesics:  Opioid Agonist Combinations Failed - 09/06/2023  8:12 AM      Failed - This refill cannot be delegated      Failed - Urine Drug Screen completed in last 360 days      Passed - Valid encounter within last 3 months    Recent Outpatient Visits           1 month ago Iron deficiency anemia, unspecified iron deficiency anemia type   [facility] Austine Lefort, MD   3 months ago Anemia, unspecified type   [facility] Austine Lefort, MD   5 months ago Acute cystitis without hematuria   [facility] Jenelle Mis, FNP   5 months ago Dyspnea on exertion   [facility] Austine Lefort, MD   8 months ago Hyponatremia   [facility] Pickard, Cisco Crest, MD       Future Appointments             In 1 month [facility] - A Dept Of [facility]

## 2023-09-07 DIAGNOSIS — M25661 Stiffness of right knee, not elsewhere classified: Secondary | ICD-10-CM | POA: Insufficient documentation

## 2023-09-09 ENCOUNTER — Ambulatory Visit: Payer: Medicare Other | Admitting: Physician Assistant

## 2023-09-16 ENCOUNTER — Ambulatory Visit: Payer: Medicare Other | Admitting: Pulmonary Disease

## 2023-09-28 ENCOUNTER — Encounter: Payer: Self-pay | Admitting: Family Medicine

## 2023-09-28 ENCOUNTER — Ambulatory Visit (INDEPENDENT_AMBULATORY_CARE_PROVIDER_SITE_OTHER): Admitting: Family Medicine

## 2023-09-28 VITALS — BP 122/86 | HR 84 | Temp 98.2°F | Ht 63.0 in | Wt 194.1 lb

## 2023-09-28 DIAGNOSIS — R9389 Abnormal findings on diagnostic imaging of other specified body structures: Secondary | ICD-10-CM | POA: Insufficient documentation

## 2023-09-28 DIAGNOSIS — L03119 Cellulitis of unspecified part of limb: Secondary | ICD-10-CM

## 2023-09-28 DIAGNOSIS — L4050 Arthropathic psoriasis, unspecified: Secondary | ICD-10-CM

## 2023-09-28 MED ORDER — DOXYCYCLINE HYCLATE 100 MG PO TABS: 100.0000 mg | ORAL_TABLET | Freq: Two times a day (BID) | ORAL | 0 refills | Status: AC

## 2023-09-28 NOTE — Progress Notes (Unsigned)
 Patient Office Visit  Assessment & Plan:  Cellulitis of ankle -     Doxycycline  Hyclate; Take 1 tablet (100 mg total) by mouth 2 (two) times daily for 10 days.  Dispense: 20 tablet; Refill: 0  Psoriatic arthritis (HCC)   Patient will let us  know if she is not improving.  May need to see dermatology regarding this. No follow-ups on file.   Subjective:    Patient ID: Janet Peterson, female    DOB: 11/15/1950  Age: 73 y.o. MRN: [id number]  Chief Complaint  Patient presents with   Foot Injury    Left heel sore x 1 week.     Foot Injury    Discussed the use of AI scribe software for clinical note transcription with the patient, who gave verbal consent to proceed.      History of Present Illness Janet Peterson is a 73 year old female with psoriatic arthritis who presents with a new lesion on her left medial heel.  A new lesion on the left heel appeared approximately a week and a half ago. It is a red area that developed after a previous keratosis was treated with cryotherapy about seven months ago. The previous keratosis had healed, but this new lesion appeared separately. She has a history of basal cell carcinoma on her face and has been treated for other skin lesions in the past. No trauma to the area, and no pus or significant oozing, although the area looks shiny due to cream application.  She is currently on Arava  for psoriatic arthritis, which she has been taking for approximately fifteen years. She denies being diabetic and is not on prednisone . She has experienced delayed healing with previous skin lesions, which she attributes to her immunosuppressive therapy. The lesion stings when water  hits it during showering, and she has had to change her footwear to avoid irritation. No fever or chills. She is not allergic to antibiotics but experiences nausea with Augmentin.  She recently had a right knee replacement about a month ago and is recovering from that surgery. She is also taking  iron pills for anemia, which she feels are not effective.  Physical Exam EXTREMITIES: Left medial heel with area that appears to be oozing.  Assessment and Plan Left heel lesion New lesion on left heel, separate from previous seborrheic keratosis. Concerns for infection or skin cancer due to immunosuppressive therapy and history of basal cell carcinoma. - Prescribed doxycycline  for 10 days. - Advised monitoring lesion and reporting if worsens or no improvement. - Consider dermatology referral if no improvement or further evaluation needed.  Psoriatic arthritis Managed with Arava  for 15 years. No recent exacerbations. Slight anemia managed with iron supplementation, though reported ineffective.  Knee replacement recovery Ongoing recovery with mobility limitations one month post-surgery.  Anemia Chronic anemia managed with iron supplementation, reported ineffective. Regular blood work every three months with rheumatology.  Basal cell carcinoma History of basal cell carcinoma on face. No current signs of recurrence.  General Health Maintenance Up to date on colonoscopies. Due for mammogram. - Advised scheduling a mammogram.      The 10-year ASCVD risk score (Arnett DK, et al., 2019) is: 15.4%  Past Medical History:  Diagnosis Date   Anemia    Ankylosing spondylitis (HCC)    Anxiety and depression    Cancer (HCC)    basal cell skin cancer   Depression    Dyspnea    Fibromyalgia    GERD (gastroesophageal reflux disease)  Hiatal hernia    per patient, dx by GI    History of basal cell carcinoma excision    FACE, 1992  &  1996   History of hiatal hernia    History of thrush    Hyperlipidemia    Hypertension    Insomnia    Left breast mass    OCD (obsessive compulsive disorder)    Osteopenia    PONV (postoperative nausea and vomiting)    Psoriatic arthritis (HCC)    PVC (premature ventricular contraction)    Rheumatoid arthritis (HCC)    Past Surgical History:   Procedure Laterality Date   BIOPSY  09/29/2019   Procedure: BIOPSY;  Surgeon: Genell Ken, MD;  Location: [facility];  Service: Gastroenterology;;   BREAST EXCISIONAL BIOPSY Right    BREAST EXCISIONAL BIOPSY Left 05/04/2018   BREAST LUMPECTOMY WITH RADIOACTIVE SEED LOCALIZATION Left 05/04/2018   Procedure: LEFT BREAST LUMPECTOMY WITH RADIOACTIVE SEED LOCALIZATION AND LEFT BREAST NIPPLE BIOPSY;  Surgeon: Caralyn Chandler, MD;  Location: [facility];  Service: General;  Laterality: Left;   BREAST SURGERY  05/25/1973   lumpectomy-- benign   BUNIONECTOMY  05/25/1989   CATARACT EXTRACTION W/ INTRAOCULAR LENS  IMPLANT, BILATERAL  05/25/1998   CERVICAL FUSION  07/24/2011   C5 -- C7   CESAREAN SECTION  05/26/1983   COLONOSCOPY WITH PROPOFOL  N/A 09/29/2019   Procedure: COLONOSCOPY WITH PROPOFOL ;  Surgeon: Genell Ken, MD;  Location: [facility];  Service: Gastroenterology;  Laterality: N/A;   DISTAL INTERPHALANGEAL JOINT FUSION Right 03/14/2015   Procedure: RIGHT LONG FINGER DISTAL INTERPHALANGEAL JOINT ARTHRODESIS;  Surgeon: Arvil Birks, MD;  Location: [facility];  Service: Orthopedics;  Laterality: Right;   ESOPHAGOGASTRODUODENOSCOPY (EGD) WITH PROPOFOL  N/A 09/29/2019   Procedure: ESOPHAGOGASTRODUODENOSCOPY (EGD) WITH PROPOFOL ;  Surgeon: Genell Ken, MD;  Location: [facility];  Service: Gastroenterology;  Laterality: N/A;   EYE SURGERY  05/25/1993   rk (laser surgery), semi cornea transplant, detacted retina,  fluid removal   HYSTEROSCOPY WITH D & C N/A 12/09/2020   Procedure: DILATATION AND CURETTAGE /HYSTEROSCOPY;  Surgeon: Meldon Sport, DO;  Location: [facility];  Service: Gynecology;  Laterality: N/A;   KNEE ARTHROSCOPY Left 03/03/2004   POLYPECTOMY  09/29/2019   Procedure: POLYPECTOMY;  Surgeon: Genell Ken, MD;  Location: [facility];  Service: Gastroenterology;;   POSTERIOR VITRECTOMY RIGHT EYE AND LASER   10/27/1999   right foot  surgery      SHOULDER SURGERY Right 05/26/1995   SKIN BIOPSY  05/2022   lower stomach   SPINAL FIXATION SURGERY W/ IMPLANT  2013 rod #1//  2014  rod 2   S1 -- T10  (rod #1)//   S1 -- T4 (rod #2)   THORACIC FUSION  03/13/2013   REMOVAL HARDWARE/  BONE GRAFT FUSION T10//  REVISION OF RODS   TOTAL KNEE ARTHROPLASTY Left 12/14/2005   TOTAL KNEE ARTHROPLASTY Right 08/20/2023   Procedure: ARTHROPLASTY, KNEE, TOTAL;  Surgeon: Winston Hawking, MD;  Location: [facility];  Service: Orthopedics;  Laterality: Right;   UVULOPALATOPHARYNGOPLASTY  04/26/2006   w/  TONSILLECTOMY/  TURBINATE REDUCTIONS/  BILATERAL ANTERIOR ETHMOIDECTOMY   Social History   Tobacco Use   Smoking status: Former    Current packs/day: 0.00    Average packs/day: 0.5 packs/day for 10.0 years (5.0 ttl pk-yrs)    Types: Cigarettes    Start date: 03/06/1985    Quit date: 03/07/1995    Years since quitting: 28.5    Passive exposure:  Never   Smokeless tobacco: Never  Vaping Use   Vaping status: Never Used  Substance Use Topics   Alcohol use: Yes    Comment: rare   Drug use: Never   Family History  Problem Relation Age of Onset   Diabetes Mother    Heart disease Mother    Diabetes Father    Anuerysm Father    Diabetes Brother    Heart disease Brother    Diabetes Brother    Heart disease Brother    Breast cancer Neg Hx    Allergies  Allergen Reactions   Latex Rash    Mouth sores   Amlodipine  Besy-Benazepril Hcl Cough    No angioedema   Amoxicillin-Pot Clavulanate Diarrhea and Nausea And Vomiting   Bextra [Valdecoxib] Diarrhea and Nausea And Vomiting    Acid reflux   Hydrochlorothiazide      Low sodium   Lipitor [Atorvastatin Calcium] Other (See Comments)    MYALGIAS   Spironolactone      Hyponatremia   Sulfa Antibiotics Nausea And Vomiting    Cramping and stomach hurts   Zocor [Simvastatin] Other (See Comments)    MYALGIA   Chlorhexidine  Rash    Rash all over body. Should not take topical or mouthwash     ROS    Objective:    BP 122/86   Pulse 84   Temp 98.2 F (36.8 C)   Ht 5\' 3"  (1.6 m)   Wt 194 lb 2 oz (88.1 kg)   SpO2 97%   BMI 34.39 kg/m  BP Readings from Last 3 Encounters:  09/28/23 122/86  08/23/23 (!) 143/68  08/10/23 132/86   Wt Readings from Last 3 Encounters:  09/28/23 194 lb 2 oz (88.1 kg)  08/20/23 184 lb (83.5 kg)  08/10/23 184 lb (83.5 kg)    Physical Exam Vitals and nursing note reviewed.  Constitutional:      Appearance: Normal appearance.  HENT:     Head: Normocephalic.     Right Ear: Tympanic membrane, ear canal and external ear normal.     Left Ear: Tympanic membrane, ear canal and external ear normal.  Eyes:     Extraocular Movements: Extraocular movements intact.     Conjunctiva/sclera: Conjunctivae normal.     Pupils: Pupils are equal, round, and reactive to light.  Cardiovascular:     Rate and Rhythm: Normal rate and regular rhythm.     Heart sounds: Normal heart sounds.  Pulmonary:     Effort: Pulmonary effort is normal.     Breath sounds: Normal breath sounds.  Musculoskeletal:     Right lower leg: No edema.     Left lower leg: No edema.  Skin:    Findings: Erythema and lesion present.     Comments: Small area medial aspect heel appears to have tiny bit of purulence. Area below it has healed (previous frozen SK). Patient is able to walk/bear weight.   Neurological:     General: No focal deficit present.     Mental Status: She is alert and oriented to person, place, and time.  Psychiatric:        Mood and Affect: Mood normal.        Behavior: Behavior normal.        Thought Content: Thought content normal.        Judgment: Judgment normal.      No results found for any visits on 09/28/23.

## 2023-09-30 NOTE — Progress Notes (Unsigned)
 Office Visit Note  Patient: Janet Peterson             Date of Birth: October 31, 1950           MRN: [id number]             PCP: Austine Lefort, MD Referring: Austine Lefort, MD Visit Date: 10/07/2023 Occupation: @GUAROCC @  Subjective:  Medication monitoring   History of Present Illness: Janet Peterson is a 73 y.o. female with history of psoriatic arthritis.  Patient is currently taking arava  20 mg 1 tablet by mouth daily.  She is tolerating Arava  without any side effects and has not had any recent gaps in therapy.  She states she underwent a right knee replacement on 08/20/23 and is currently going to PT twice weekly.  She states she held arava  2 weeks before surgery and 2 weeks after surgery.  Patient states that overall her symptoms secondary to psoriatic arthritis have been stable.  She denies any psoriasis at this time.  She denies any joint swelling.  She denies any evidence of Achilles tendinitis or plantar fasciitis.  She continues to have chronic pain in her lower back.  Patient states that she had a keratosis frozen off by her dermatologist on the left foot recently.  She has had some delayed healing at the site and has been using a topical steroid prescribed by her primary care.  She has followed up with her primary care provider twice to monitor for the healing process.  She has not been holding Arava  recently.    Activities of Daily Living:  Patient reports morning stiffness for 1 hour.   Patient Reports nocturnal pain.  Difficulty dressing/grooming: Denies Difficulty climbing stairs: Reports Difficulty getting out of chair: Reports Difficulty using hands for taps, buttons, cutlery, and/or writing: Reports  Review of Systems  Constitutional:  Positive for fatigue.  HENT:  Negative for mouth sores and mouth dryness.   Eyes:  Positive for dryness.  Respiratory:  Positive for shortness of breath.   Cardiovascular:  Negative for chest pain and palpitations.  Gastrointestinal:   Negative for blood in stool, constipation and diarrhea.  Endocrine: Negative for increased urination.  Genitourinary:  Negative for involuntary urination.  Musculoskeletal:  Positive for joint pain, gait problem, joint pain, joint swelling, myalgias, muscle weakness, morning stiffness, muscle tenderness and myalgias.  Skin:  Positive for color change. Negative for rash, hair loss and sensitivity to sunlight.  Allergic/Immunologic: Negative for susceptible to infections.  Neurological:  Negative for dizziness and headaches.  Hematological:  Negative for swollen glands.  Psychiatric/Behavioral:  Positive for depressed mood and sleep disturbance. The patient is nervous/anxious.     PMFS History:  Patient Active Problem List   Diagnosis Date Noted   Abnormal findings on diagnostic imaging of other specified body structures 09/28/2023   Stiffness of right knee 09/07/2023   Status post total knee replacement, right 08/20/2023   Muscle weakness (generalized) 06/28/2023   Arthritis of right knee 06/03/2023   Tardive dyskinesia 05/31/2023   Acute cystitis without hematuria 04/07/2023   Pain in joint of right knee 04/01/2023   Sensorineural hearing loss, bilateral 03/03/2023   Bilateral impacted cerumen 03/03/2023   DOE (dyspnea on exertion) 10/27/2022   Subacute maxillary sinusitis 10/13/2022   Thickened endometrium 07/30/2022   History of adenomatous polyp of colon 07/30/2022   Bacterial conjunctivitis of right eye 04/30/2022   Contact dermatitis 04/30/2022   Lesion of liver 06/20/2021   Pain of left  shoulder joint on movement 05/29/2020   Pain in left shoulder 05/29/2020   C. difficile colitis 09/29/2019   Colitis 09/28/2019   Anxiety and depression 09/28/2019   DDD (degenerative disc disease), cervical s/p fusion 11/12/2016   H/O total knee replacement, left 05/06/2016   DDD lumbar spine status post fusion 05/06/2016   Psoriasis 05/05/2016   High risk medication use 05/05/2016    Cervical post-laminectomy syndrome 12/09/2015   Thoracic postlaminectomy syndrome 12/09/2015   Psoriatic arthritis (HCC) 08/23/2012   Osteopenia 08/23/2012   HLD (hyperlipidemia) 08/23/2012   RLS (restless legs syndrome) 08/23/2012   Insomnia 08/23/2012   Fibromyalgia syndrome 08/12/2012   Low back pain 08/12/2012   Syncope    PVC (premature ventricular contraction)    OCD (obsessive compulsive disorder)    GERD (gastroesophageal reflux disease)    Hypertension     Past Medical History:  Diagnosis Date   Anemia    Ankylosing spondylitis (HCC)    Anxiety and depression    Cancer (HCC)    basal cell skin cancer   Depression    Dyspnea    Fibromyalgia    GERD (gastroesophageal reflux disease)    Hiatal hernia    per patient, dx by GI    History of basal cell carcinoma excision    FACE, 1992  &  1996   History of hiatal hernia    History of thrush    Hyperlipidemia    Hypertension    Insomnia    Left breast mass    OCD (obsessive compulsive disorder)    Osteopenia    PONV (postoperative nausea and vomiting)    Psoriatic arthritis (HCC)    PVC (premature ventricular contraction)    Rheumatoid arthritis (HCC)     Family History  Problem Relation Age of Onset   Diabetes Mother    Heart disease Mother    Diabetes Father    Anuerysm Father    Diabetes Brother    Heart disease Brother    Diabetes Brother    Heart disease Brother    Breast cancer Neg Hx    Past Surgical History:  Procedure Laterality Date   BIOPSY  09/29/2019   Procedure: BIOPSY;  Surgeon: Genell Ken, MD;  Location: [facility];  Service: Gastroenterology;;   BREAST EXCISIONAL BIOPSY Right    BREAST EXCISIONAL BIOPSY Left 05/04/2018   BREAST LUMPECTOMY WITH RADIOACTIVE SEED LOCALIZATION Left 05/04/2018   Procedure: LEFT BREAST LUMPECTOMY WITH RADIOACTIVE SEED LOCALIZATION AND LEFT BREAST NIPPLE BIOPSY;  Surgeon: Caralyn Chandler, MD;  Location: [facility];  Service: General;   Laterality: Left;   BREAST SURGERY  05/25/1973   lumpectomy-- benign   BUNIONECTOMY  05/25/1989   CATARACT EXTRACTION W/ INTRAOCULAR LENS  IMPLANT, BILATERAL  05/25/1998   CERVICAL FUSION  07/24/2011   C5 -- C7   CESAREAN SECTION  05/26/1983   COLONOSCOPY WITH PROPOFOL  N/A 09/29/2019   Procedure: COLONOSCOPY WITH PROPOFOL ;  Surgeon: Genell Ken, MD;  Location: [facility];  Service: Gastroenterology;  Laterality: N/A;   DISTAL INTERPHALANGEAL JOINT FUSION Right 03/14/2015   Procedure: RIGHT LONG FINGER DISTAL INTERPHALANGEAL JOINT ARTHRODESIS;  Surgeon: Arvil Birks, MD;  [facility];  Service: Orthopedics;  Laterality: Right;   ESOPHAGOGASTRODUODENOSCOPY (EGD) WITH PROPOFOL  N/A 09/29/2019   Procedure: ESOPHAGOGASTRODUODENOSCOPY (EGD) WITH PROPOFOL ;  Surgeon: Genell Ken, MD;  Location: [facility];  Service: Gastroenterology;  Laterality: N/A;   EYE SURGERY  05/25/1993   rk (laser surgery), semi cornea transplant, detacted  retina,  fluid removal   HYSTEROSCOPY WITH D & C N/A 12/09/2020   Procedure: DILATATION AND CURETTAGE /HYSTEROSCOPY;  Surgeon: Meldon Sport, DO;  Location: [facility];  Service: Gynecology;  Laterality: N/A;   KNEE ARTHROSCOPY Left 03/03/2004   POLYPECTOMY  09/29/2019   Procedure: POLYPECTOMY;  Surgeon: Genell Ken, MD;  Location: [facility];  Service: Gastroenterology;;   POSTERIOR VITRECTOMY RIGHT EYE AND LASER   10/27/1999   right foot surgery      SHOULDER SURGERY Right 05/26/1995   SKIN BIOPSY  05/2022   lower stomach   SPINAL FIXATION SURGERY W/ IMPLANT  2013 rod #1//  2014  rod 2   S1 -- T10  (rod #1)//   S1 -- T4 (rod #2)   THORACIC FUSION  03/13/2013   REMOVAL HARDWARE/  BONE GRAFT FUSION T10//  REVISION OF RODS   TOTAL KNEE ARTHROPLASTY Left 12/14/2005   TOTAL KNEE ARTHROPLASTY Right 08/20/2023   Procedure: ARTHROPLASTY, KNEE, TOTAL;  Surgeon: Winston Hawking, MD;  Location: [facility];  Service: Orthopedics;   Laterality: Right;   UVULOPALATOPHARYNGOPLASTY  04/26/2006   w/  TONSILLECTOMY/  TURBINATE REDUCTIONS/  BILATERAL ANTERIOR ETHMOIDECTOMY   Social History   Social History Narrative   Lives with husband   Immunization History  Administered Date(s) Administered   Fluad Quad(high Dose 65+) 01/26/2019, 03/05/2020, 03/18/2022   Fluad Trivalent(High Dose 65+) 03/16/2023   Influenza Whole 03/04/2012   Influenza, High Dose Seasonal PF 03/25/2018   Influenza,inj,Quad PF,6+ Mos 02/28/2013, 03/07/2014, 02/28/2015, 03/27/2016, 03/09/2017   Influenza-Unspecified 04/08/2021   PFIZER(Purple Top)SARS-COV-2 Vaccination 08/10/2019   PNEUMOCOCCAL CONJUGATE-20 10/27/2022   Pneumococcal Conjugate-13 09/10/2016   Pneumococcal Polysaccharide-23 03/12/2006, 11/09/2011   Pneumococcal-Unspecified 08/31/2016   Unspecified SARS-COV-2 Vaccination 08/07/2019, 08/28/2019, 03/08/2020   Zoster Recombinant(Shingrix) 08/12/2021, 11/21/2021     Objective: Vital Signs: BP 120/81 (BP Location: Left Arm, Patient Position: Sitting, Cuff Size: Normal)   Pulse 88   Resp 16   Ht 5' 3.5" (1.613 m)   Wt 191 lb (86.6 kg)   BMI 33.30 kg/m    Physical Exam Vitals and nursing note reviewed.  Constitutional:      Appearance: She is well-developed.  HENT:     Head: Normocephalic and atraumatic.  Eyes:     Conjunctiva/sclera: Conjunctivae normal.  Cardiovascular:     Rate and Rhythm: Normal rate and regular rhythm.     Heart sounds: Normal heart sounds.  Pulmonary:     Effort: Pulmonary effort is normal.     Breath sounds: Normal breath sounds.  Abdominal:     General: Bowel sounds are normal.     Palpations: Abdomen is soft.  Musculoskeletal:     Cervical back: Normal range of motion.  Lymphadenopathy:     Cervical: No cervical adenopathy.  Skin:    General: Skin is warm and dry.     Capillary Refill: Capillary refill takes less than 2 seconds.  Neurological:     Mental Status: She is alert and oriented to  person, place, and time.  Psychiatric:        Behavior: Behavior normal.      Musculoskeletal Exam: Patient remained seated during the examination today.  Shoulder joints have good range of motion with some discomfort and tenderness bilaterally.  Elbow joints, wrist joints, MCPs, PIPs, DIPs have good range of motion with no synovitis.  Tenderness and thickening of the right second CMC joint noted.  PIP and DIP thickening noted.  Complete fist formation bilaterally.  Hip  joints able to assess in seated position.  Right knee replacement is slightly limited extension.  Left knee joint has no warmth or effusion.  Ankle joints have good range of motion with no tenderness.  No evidence of achilles tendonitis.   CDAI Exam: CDAI Score: -- Patient Global: --; Provider Global: -- Swollen: --; Tender: -- Joint Exam 10/07/2023   No joint exam has been documented for this visit   There is currently no information documented on the homunculus. Go to the Rheumatology activity and complete the homunculus joint exam.  Investigation: No additional findings.  Imaging: No results found.  Recent Labs: Lab Results  Component Value Date   WBC 9.9 08/22/2023   HGB 10.9 (L) 08/22/2023   PLT 189 08/22/2023   NA 136 08/21/2023   K 3.6 08/21/2023   CL 103 08/21/2023   CO2 25 08/21/2023   GLUCOSE 138 (H) 08/21/2023   BUN 11 08/21/2023   CREATININE 0.91 08/21/2023   BILITOT 0.4 06/07/2023   ALKPHOS 99 05/27/2023   AST 11 06/07/2023   ALT 8 06/07/2023   PROT 5.9 (L) 06/07/2023   ALBUMIN  3.6 05/27/2023   CALCIUM 9.0 08/21/2023   GFRAA 82 09/26/2020   QFTBGOLD Indeterminate 08/14/2016   QFTBGOLDPLUS Negative 12/22/2018    Speciality Comments: Simponi  Aria every 8 weeks started Jan 2018  TB gold negative 08/19/16  Procedures:  No procedures performed Allergies: Latex, Amlodipine  besy-benazepril hcl, Amoxicillin-pot clavulanate, Bextra [valdecoxib], Hydrochlorothiazide , Lipitor [atorvastatin  calcium], Spironolactone , Sulfa antibiotics, Zocor [simvastatin], and Chlorhexidine      Assessment / Plan:     Visit Diagnoses: Psoriatic arthritis (HCC): No synovitis or dactylitis noted.  No evidence of Achilles tendinitis or plantar fasciitis.  No active psoriasis at this time.  She has clinically been doing well taking Arava  20 1 tablet by mouth daily.  She recently had a gap in therapy at which time she held Arava  2 weeks prior to and 2 weeks after undergoing a right knee total arthroplasty on 08/20/2023.  She did not notice any new or worsening symptoms during the gap in therapy.  She has no active psoriasis at this time.  Patient will remain on Arava  as monotherapy.  She was advised to notify us  if she develop signs or symptoms of a flare.  She will follow-up in the office in 5 months or sooner if needed.  Psoriasis: She has no active psoriasis at this time.  High risk medication use - Arava  20 mg 1 tablet by mouth daily.  Previously was on Simponi  Aria IV infusion 2 mg/kg every 8 weeks-patient discontinued in March 2021 due to C. difficile. CBC updated on 08/22/23.  BMP updated on 08/21/23.  She will return for updated lab work in June and every 3 months to monitor for drug toxicity. No recurrent infections.  Discussed the importance of holding arava  if she develops signs or symptoms of an infection and to resume once the infection has completely cleared.    Primary osteoarthritis of both hands: Tenderness and thickening of the right CMC joint.  PIP and DIP thickening consistent with osteoarthritis of both hands.  No synovitis or dactylitis noted on examination today.  S/P TKR (total knee replacement), right - [place] right knee June/July 2023.  Patient underwent a right total knee replacement on 08/20/2023.  H/O total knee replacement, left: Doing well.  No warmth or effusion noted.  DDD (degenerative disc disease), cervical s/p fusion: C-spine has limited range of motion with lateral  rotation.  DDD (degenerative  disc disease), thoracic: Thoracic kyphosis noted.  DDD lumbar spine status post fusion: Chronic pain and limited mobility.   Fibromyalgia: She has intermittent myalgias and muscle tenderness due to fibromyalgia.  She takes methocarbamol  500 mg 1 tablet every 8 hours as needed for muscle spasms.  Other fatigue: Chronic, stable.  Primary insomnia  Osteopenia of multiple sites - DEXA scan from April 02 2021 DEXA showed T score -1.3.  BMD 0.857 in the left femoral neck.  Patient is overdue for a bone density and will have Dr. Cheril Cork place the order.  We can review DEXA results once completed.  Cellulitis of left ankle: Medial aspect of left heel-under the care of of PCP.  Photo attached above. Recently treated with a course of doxycycline --improving.  She continues to notice some delayed healing and was encouraged to hold arava  until the skin breakdown has completely healed.   Other medical conditions are listed as follows:  History of gastroesophageal reflux (GERD)  History of hyperlipidemia  History of hypertension  History of OCD (obsessive compulsive disorder)  RLS (restless legs syndrome)  History of migraine  Orders: No orders of the defined types were placed in this encounter.  No orders of the defined types were placed in this encounter.    Follow-Up Instructions: Return in about 5 months (around 03/08/2024) for Psoriatic arthritis.   Romayne Clubs, PA-C  Note - This record has been created using Dragon software.  Chart creation errors have been sought, but may not always  have been located. Such creation errors do not reflect on  the standard of medical care.

## 2023-10-04 ENCOUNTER — Other Ambulatory Visit: Payer: Self-pay | Admitting: Family Medicine

## 2023-10-05 ENCOUNTER — Other Ambulatory Visit: Payer: Self-pay | Admitting: Family Medicine

## 2023-10-05 NOTE — Telephone Encounter (Unsigned)
 Copied from CRM [id number]. Topic: Clinical - Medication Refill >> Oct 05, 2023  2:10 PM Antwanette L wrote: Medication: oxyCODONE -acetaminophen  (PERCOCET) 5-325 MG tablet  Has the patient contacted their pharmacy? No   This is the patient's preferred pharmacy:  [id number]le, [place] - [address] [place] [address] Phone: [phone] Fax: [phone]  Is this the correct pharmacy for this prescription? Yes   Has the prescription been filled recently? Yes. Last refilled on 09/06/23  Is the patient out of the medication? Yes  Has the patient been seen for an appointment in the last year OR does the patient have an upcoming appointment? Yes. Last ov was 09/28/23  Can we respond through MyChart? No. Contact patient by phone at [phone]  Agent: Please be advised that Rx refills may take up to 3 business days. We ask that you follow-up with your pharmacy.

## 2023-10-06 ENCOUNTER — Ambulatory Visit: Payer: Self-pay | Admitting: Pulmonary Disease

## 2023-10-06 NOTE — Telephone Encounter (Signed)
 Requested medication (s) are due for refill today -yes  Requested medication (s) are on the active medication list -yes  Future visit scheduled -no  Last refill: 09/06/23 #40  Notes to clinic: non delegated Rx  Requested Prescriptions  Pending Prescriptions Disp Refills   oxyCODONE -acetaminophen  (PERCOCET) 5-325 MG tablet 40 tablet 0    Sig: Take 1 tablet by mouth every 8 (eight) hours as needed for severe pain (pain score 7-10). Ok to fill after 01/01/23     Not Delegated - Analgesics:  Opioid Agonist Combinations Failed - 10/06/2023 10:18 AM      Failed - This refill cannot be delegated      Failed - Urine Drug Screen completed in last 360 days      Passed - Valid encounter within last 3 months    Recent Outpatient Visits           1 week ago Cellulitis of ankle   [place] [facility] Medicine Amadeo June, MD   2 months ago Iron deficiency anemia, unspecified iron deficiency anemia type   [place] [facility] Medicine Austine Lefort, MD   4 months ago Anemia, unspecified type   [place] [facility] Medicine Austine Lefort, MD   6 months ago Acute cystitis without hematuria   [place] [facility] Medicine Jenelle Mis, FNP   6 months ago Dyspnea on exertion   [place] [facility] Family Medicine Pickard, Cisco Crest, MD       Future Appointments             Tomorrow Romayne Clubs, PA-C [facility] Health Rheumatology - A Dept Of [place]. [facility]               Requested Prescriptions  Pending Prescriptions Disp Refills   oxyCODONE -acetaminophen  (PERCOCET) 5-325 MG tablet 40 tablet 0    Sig: Take 1 tablet by mouth every 8 (eight) hours as needed for severe pain (pain score 7-10). Ok to fill after 01/01/23     Not Delegated - Analgesics:  Opioid Agonist Combinations Failed - 10/06/2023 10:18 AM      Failed - This refill cannot be delegated      Failed - Urine Drug Screen completed in last 360 days       Passed - Valid encounter within last 3 months    Recent Outpatient Visits           1 week ago Cellulitis of ankle   [place] [facility] Medicine Amadeo June, MD   2 months ago Iron deficiency anemia, unspecified iron deficiency anemia type   [place] [facility] Medicine Austine Lefort, MD   4 months ago Anemia, unspecified type   [place] [facility] Medicine Austine Lefort, MD   6 months ago Acute cystitis without hematuria   [place] [facility] Medicine Jenelle Mis, FNP   6 months ago Dyspnea on exertion    [facility] Pickard, Cisco Crest, MD       Future Appointments             Tomorrow Romayne Clubs, PA-C [facility] Health Rheumatology - A Dept Of [place]. [facility]

## 2023-10-06 NOTE — Telephone Encounter (Signed)
 Requested medication (s) are due for refill today yes  Requested medication (s) are on the active medication list -yes  Future visit scheduled -no  Last refill: 07/22/23 30g  Notes to clinic: off protocol- provider review   Requested Prescriptions  Pending Prescriptions Disp Refills   clotrimazole  (LOTRIMIN ) 1 % cream [Pharmacy Med Name: CLOTRIMAZOLE  1% TOPICAL CREAM] 30 g 0    Sig: APPLY TO AFFECTED AREA TWICE A DAY     Off-Protocol Failed - 10/06/2023  8:47 AM      Failed - Medication not assigned to a protocol, review manually.      Passed - Valid encounter within last 12 months    Recent Outpatient Visits           1 week ago Cellulitis of ankle   [place] [facility] Medicine Amadeo June, MD   2 months ago Iron deficiency anemia, unspecified iron deficiency anemia type   [place] [facility] Medicine Austine Lefort, MD   4 months ago Anemia, unspecified type   [place] [facility] Medicine Austine Lefort, MD   6 months ago Acute cystitis without hematuria   [place] [facility] Family Medicine Jenelle Mis, FNP   6 months ago Dyspnea on exertion   [place] [facility] Family Medicine Pickard, Cisco Crest, MD       Future Appointments             Tomorrow Romayne Clubs, PA-C [facility] Health Rheumatology - A Dept Of [place]. [facility]               Requested Prescriptions  Pending Prescriptions Disp Refills   clotrimazole  (LOTRIMIN ) 1 % cream [Pharmacy Med Name: CLOTRIMAZOLE  1% TOPICAL CREAM] 30 g 0    Sig: APPLY TO AFFECTED AREA TWICE A DAY     Off-Protocol Failed - 10/06/2023  8:47 AM      Failed - Medication not assigned to a protocol, review manually.      Passed - Valid encounter within last 12 months    Recent Outpatient Visits           1 week ago Cellulitis of ankle   [place] [facility] Medicine Amadeo June, MD   2 months ago Iron deficiency anemia, unspecified iron  deficiency anemia type   [place] [facility] Medicine Austine Lefort, MD   4 months ago Anemia, unspecified type   [place] [facility] Medicine Austine Lefort, MD   6 months ago Acute cystitis without hematuria   [place] [facility] Family Medicine Jenelle Mis, FNP   6 months ago Dyspnea on exertion   [place] [facility] Family Medicine Pickard, Cisco Crest, MD       Future Appointments             Tomorrow Romayne Clubs, PA-C [facility] Health Rheumatology - A Dept Of [place]. [facility]

## 2023-10-07 ENCOUNTER — Encounter: Payer: Self-pay | Admitting: Physician Assistant

## 2023-10-07 ENCOUNTER — Ambulatory Visit: Attending: Physician Assistant | Admitting: Physician Assistant

## 2023-10-07 VITALS — BP 120/81 | HR 88 | Resp 16 | Ht 63.5 in | Wt 191.0 lb

## 2023-10-07 DIAGNOSIS — L03116 Cellulitis of left lower limb: Secondary | ICD-10-CM

## 2023-10-07 DIAGNOSIS — M47816 Spondylosis without myelopathy or radiculopathy, lumbar region: Secondary | ICD-10-CM

## 2023-10-07 DIAGNOSIS — M19041 Primary osteoarthritis, right hand: Secondary | ICD-10-CM

## 2023-10-07 DIAGNOSIS — M8589 Other specified disorders of bone density and structure, multiple sites: Secondary | ICD-10-CM

## 2023-10-07 DIAGNOSIS — M19042 Primary osteoarthritis, left hand: Secondary | ICD-10-CM

## 2023-10-07 DIAGNOSIS — Z8679 Personal history of other diseases of the circulatory system: Secondary | ICD-10-CM

## 2023-10-07 DIAGNOSIS — M5030 Other cervical disc degeneration, unspecified cervical region: Secondary | ICD-10-CM

## 2023-10-07 DIAGNOSIS — G2581 Restless legs syndrome: Secondary | ICD-10-CM

## 2023-10-07 DIAGNOSIS — Z8659 Personal history of other mental and behavioral disorders: Secondary | ICD-10-CM

## 2023-10-07 DIAGNOSIS — F5101 Primary insomnia: Secondary | ICD-10-CM

## 2023-10-07 DIAGNOSIS — Z79899 Other long term (current) drug therapy: Secondary | ICD-10-CM

## 2023-10-07 DIAGNOSIS — L409 Psoriasis, unspecified: Secondary | ICD-10-CM | POA: Diagnosis not present

## 2023-10-07 DIAGNOSIS — M1711 Unilateral primary osteoarthritis, right knee: Secondary | ICD-10-CM

## 2023-10-07 DIAGNOSIS — Z8639 Personal history of other endocrine, nutritional and metabolic disease: Secondary | ICD-10-CM

## 2023-10-07 DIAGNOSIS — M797 Fibromyalgia: Secondary | ICD-10-CM

## 2023-10-07 DIAGNOSIS — Z96652 Presence of left artificial knee joint: Secondary | ICD-10-CM

## 2023-10-07 DIAGNOSIS — L4050 Arthropathic psoriasis, unspecified: Secondary | ICD-10-CM | POA: Diagnosis not present

## 2023-10-07 DIAGNOSIS — Z8719 Personal history of other diseases of the digestive system: Secondary | ICD-10-CM

## 2023-10-07 DIAGNOSIS — M5134 Other intervertebral disc degeneration, thoracic region: Secondary | ICD-10-CM

## 2023-10-07 DIAGNOSIS — Z96651 Presence of right artificial knee joint: Secondary | ICD-10-CM

## 2023-10-07 DIAGNOSIS — Z8669 Personal history of other diseases of the nervous system and sense organs: Secondary | ICD-10-CM

## 2023-10-07 DIAGNOSIS — R5383 Other fatigue: Secondary | ICD-10-CM

## 2023-10-07 MED ORDER — OXYCODONE-ACETAMINOPHEN 5-325 MG PO TABS: 1.0000 | ORAL_TABLET | Freq: Three times a day (TID) | ORAL | 0 refills | Status: DC | PRN

## 2023-10-07 NOTE — Patient Instructions (Signed)
 Standing Labs We placed an order today for your standing lab work.   Please have your standing labs drawn in June and every 3 months   Please have your labs drawn 2 weeks prior to your appointment so that the provider can discuss your lab results at your appointment, if possible.  Please note that you may see your imaging and lab results in MyChart before we have reviewed them. We will contact you once all results are reviewed. Please allow our office up to 72 hours to thoroughly review all of the results before contacting the office for clarification of your results.  WALK-IN LAB HOURS  Monday through Thursday from 8:00 am -12:30 pm and 1:00 pm-4:00 pm and Friday from 8:00 am-12:00 pm.  Patients with office visits requiring labs will be seen before walk-in labs.  You may encounter longer than normal wait times. Please allow additional time. Wait times may be shorter on  Monday and Thursday afternoons.  We do not book appointments for walk-in labs. We appreciate your patience and understanding with our staff.   Labs are drawn by [facility]. Please bring your co-pay at the time of your lab draw.  You may receive a bill from [facility] for your lab work.  Please note if you are on Hydroxychloroquine  and and an order has been placed for a Hydroxychloroquine  level,  you will need to have it drawn 4 hours or more after your last dose.  If you wish to have your labs drawn at another location, please call the office 24 hours in advance so we can fax the orders.  The office is located at [address], [place], [place] [address]   If you have any questions regarding directions or hours of operation,  please call [phone].   As a reminder, please drink plenty of water prior to coming for your lab work. Thanks!

## 2023-10-11 ENCOUNTER — Other Ambulatory Visit: Payer: Self-pay | Admitting: Family Medicine

## 2023-10-13 ENCOUNTER — Telehealth: Payer: Self-pay

## 2023-10-13 ENCOUNTER — Other Ambulatory Visit: Payer: Self-pay | Admitting: Cardiovascular Disease

## 2023-10-13 DIAGNOSIS — R0609 Other forms of dyspnea: Secondary | ICD-10-CM

## 2023-10-13 DIAGNOSIS — I10 Essential (primary) hypertension: Secondary | ICD-10-CM

## 2023-10-13 DIAGNOSIS — E782 Mixed hyperlipidemia: Secondary | ICD-10-CM

## 2023-10-13 NOTE — Telephone Encounter (Signed)
 Form received from [employer] for follow up on an adverse reaction that was reported to the company.  Spoke with patient's husband Tashanti Dalporto, who is on [place]. Daryl states the patient has not had an allergic reaction to the medication and is doing fine. No documentation in the patient's chart from other providers that she had an adverse reaction either.  Form sent back to [employer] documenting the above information. Thanks. Mjp,lpn

## 2023-10-13 NOTE — Telephone Encounter (Signed)
 Requested Prescriptions  Pending Prescriptions Disp Refills   dexlansoprazole  (DEXILANT ) 60 MG capsule [Pharmacy Med Name: DEXLANSOPRAZOLE  DR 60 MG CAP] 90 capsule 0    Sig: TAKE 1 CAPSULE BY MOUTH EVERY DAY     Gastroenterology: Proton Pump Inhibitors Passed - 10/13/2023 12:10 PM      Passed - Valid encounter within last 12 months    Recent Outpatient Visits           2 weeks ago Cellulitis of ankle   [facility] Medicine Amadeo June, MD   2 months ago Iron deficiency anemia, unspecified iron deficiency anemia type   [facility] Austine Lefort, MD   4 months ago Anemia, unspecified type   [facility] Austine Lefort, MD   6 months ago Acute cystitis without hematuria   [facility] Jenelle Mis, FNP   7 months ago Dyspnea on exertion    [facility] Pickard, Cisco Crest, MD       Future Appointments             In 4 months Eddie Good Arty Later [facility] Rheumatology - A Dept Of [place]. [facility]

## 2023-11-01 ENCOUNTER — Ambulatory Visit (INDEPENDENT_AMBULATORY_CARE_PROVIDER_SITE_OTHER): Admitting: Pulmonary Disease

## 2023-11-01 ENCOUNTER — Encounter: Payer: Self-pay | Admitting: Pulmonary Disease

## 2023-11-01 VITALS — BP 125/85 | HR 82 | Ht 63.0 in | Wt 191.2 lb

## 2023-11-01 DIAGNOSIS — J4530 Mild persistent asthma, uncomplicated: Secondary | ICD-10-CM

## 2023-11-01 MED ORDER — MONTELUKAST SODIUM 10 MG PO TABS: 10.0000 mg | ORAL_TABLET | Freq: Every day | ORAL | 11 refills | Status: AC

## 2023-11-01 NOTE — Patient Instructions (Signed)
 VISIT SUMMARY:  Today, we discussed your ongoing breathing difficulties and swelling in your feet. We reviewed your current medications and recent test results to adjust your treatment plan accordingly.  YOUR PLAN:  -ASTHMA: Asthma is a condition where your airways narrow and swell, making it difficult to breathe. Your CT scan shows air trapping, likely due to asthma. You should continue taking Breztri  daily and start Singulair to help control your asthma better. Additionally, incorporating an aerobic exercise program can improve your respiratory function.  -RESTRICTIVE LUNG DISEASE: Restrictive lung disease means your lungs cannot expand fully, often due to physical restrictions. In your case, this is likely due to the spinal hardware from your back surgery. No interstitial lung disease was noted on your CT scan.  -EDEMA: Edema is swelling caused by excess fluid trapped in your body's tissues. Your leg swelling may be worsened by your recent knee replacement. Diuretics are not recommended due to the risk of low sodium levels. Recent tests have ruled out heart failure as a cause.  INSTRUCTIONS:  Please continue taking Breztri  daily and start Singulair as prescribed. Incorporate an aerobic exercise program to help with your asthma. Follow up with your doctor if you experience any new or worsening symptoms.

## 2023-11-01 NOTE — Progress Notes (Unsigned)
 Janet Peterson    [id number]    12-01-1950  Primary Care Physician:Pickard, Cisco Crest, MD  Referring Physician: Austine Lefort, MD [address] [place],  [place] [address]  Chief complaint: Follow-up for dyspnea  HPI: 73 y.o. who  has a past medical history of Anemia, Ankylosing spondylitis (HCC), Anxiety and depression, Cancer (HCC), Depression, Dyspnea, Fibromyalgia, GERD (gastroesophageal reflux disease), Hiatal hernia, History of basal cell carcinoma excision, History of hiatal hernia, History of thrush, Hyperlipidemia, Hypertension, Insomnia, Left breast mass, OCD (obsessive compulsive disorder), Osteopenia, PONV (postoperative nausea and vomiting), Psoriatic arthritis (HCC), PVC (premature ventricular contraction), and Rheumatoid arthritis (HCC).   Discussed the use of AI scribe software for clinical note transcription with the patient, who gave verbal consent to proceed.  Janet Peterson is a 73 year old female who presents with chronic shortness of breath. She is accompanied by her husband, Janet Peterson. She was referred by a PA for evaluation of her chronic shortness of breath.  She has experienced shortness of breath for several years, which has progressively worsened. She becomes short of breath after walking approximately five to ten yards, such as from her car to her house, and also experiences difficulty breathing after showering, requiring her to rest her head on a ledge. She occasionally wakes up at night feeling breathless, which improves after sitting up and taking deep breaths. No cough, mucus congestion, or wheezing.  She underwent a pulmonary function test in December or January, which indicated low lung function. She was informed that her stomach pushes on her diaphragm, affecting her lungs. A CT scan of her chest in May and a CT of her heart in October were performed, both reported to be normal, although there was mention of atelectasis. She was previously given an  albuterol  inhaler by a PA, which she used sporadically with minimal relief.  She has experienced swelling in her feet and legs, for which she was previously prescribed HCTZ and spironolactone , both of which helped with her breathing. However, her primary care doctor discontinued these medications due to low sodium levels. Her sodium levels have been as low as 127, but were normal in January. She is scheduled for lab work to recheck her sodium levels.  Her past medical history includes scoliosis and significant spinal issues, for which she has had surgery involving three rods and fifty-two screws. She has allergies and previously received allergy shots for twenty years, which she discontinued in 2000. She denies any family history of lung disease.  She has a history of smoking, having quit in 1996 after smoking intermittently for about 15-20 years, with a maximum of six cigarettes a day.   Pets: Used to have a dog Occupation: Retired Runner, broadcasting/film/video Exposures: No mold, hot tub, Financial controller.  No feather pillows or comforters Smoking history: 10-pack-year smoker.  Quit in 1996 Travel history: Previously lived in [place], no significant recent travel Relevant family history: No family history of lung disease  Interim History: Discussed the use of AI scribe software for clinical note transcription with the patient, who gave verbal consent to proceed.  History of Present Illness Janet Peterson is a 73 year old female with asthma and restrictive lung disease who presents with breathing difficulties.  She has experienced some improvement in her breathing with the use of Breztri , which she takes once daily. Despite this, her symptoms persist and may require stronger medication. A recent CT scan revealed air trapping. Her history  includes minimal smoking, having quit many years ago. Previous lung function tests demonstrated improvement with albuterol , indicating a bronchodilator response.  She experiences swelling  in her feet, which has worsened following a recent knee replacement. Concerns about hyponatremia have prevented the use of diuretics, although her sodium levels were normal three months ago. She was evaluated for heart failure due to the leg swelling, but her heart specialist and recent tests confirmed normal heart function.  Her medical history includes back surgery involving 'fifty two screws and three rods.' She is currently on semaglutide  for weight loss and has successfully lost about ten pounds.    Outpatient Encounter Medications as of 11/01/2023  Medication Sig   amLODipine  (NORVASC ) 5 MG tablet TAKE 1 TABLET (5 MG TOTAL) BY MOUTH DAILY.   Budeson-Glycopyrrol-Formoterol  (BREZTRI  AEROSPHERE) 160-9-4.8 MCG/ACT AERO Inhale 2 puffs into the lungs in the morning and at bedtime.   clonazePAM  (KLONOPIN ) 1 MG tablet TAKE 1 TABLET BY MOUTH EVERY DAY AS NEEDED FOR ANXIETY   clotrimazole  (LOTRIMIN ) 1 % cream APPLY TO AFFECTED AREA TWICE A DAY   dexlansoprazole  (DEXILANT ) 60 MG capsule TAKE 1 CAPSULE BY MOUTH EVERY DAY   Evolocumab  (REPATHA  SURECLICK) 140 MG/ML SOAJ Inject 140 mg into the skin every 14 (fourteen) days.   ezetimibe  (ZETIA ) 10 MG tablet TAKE 1 TABLET BY MOUTH EVERY DAY   Ferrous Sulfate (IRON PO) Take by mouth.   FLUoxetine  (PROZAC ) 20 MG capsule Take 60 mg by mouth at bedtime.   fluticasone  (FLONASE ) 50 MCG/ACT nasal spray Place 2 sprays into both nostrils daily as needed for allergies.   INGREZZA  80 MG capsule Take one capsule by mouth daily (Patient taking differently: 80 mg daily.)   leflunomide  (ARAVA ) 20 MG tablet TAKE 1 TABLET BY MOUTH EVERY DAY   LINZESS  72 MCG capsule TAKE 1 CAPSULE BY MOUTH DAILY BEFORE BREAKFAST. (Patient taking differently: Take 72 mcg by mouth daily as needed (constipation).)   methocarbamol  (ROBAXIN ) 500 MG tablet Take 1 tablet (500 mg total) by mouth every 8 (eight) hours as needed for muscle spasms.   metoprolol  succinate (TOPROL -XL) 50 MG 24 hr tablet  TAKE 1 TABLET BY MOUTH DAILY WITH A MEAL.   ondansetron  (ZOFRAN ) 4 MG tablet Take 1 tablet (4 mg total) by mouth 4 (four) times daily as needed for nausea.   ondansetron  (ZOFRAN ) 4 MG tablet Take 1 tablet (4 mg total) by mouth every 8 (eight) hours as needed.   oxyCODONE -acetaminophen  (PERCOCET) 5-325 MG tablet Take 1 tablet by mouth every 8 (eight) hours as needed for severe pain (pain score 7-10). Ok to fill after 01/01/23   SEMAGLUTIDE ,0.25 OR 0.5MG /DOS, [place] Inject 1 mg into the skin once a week.   valsartan  (DIOVAN ) 320 MG tablet Take 1 tablet (320 mg total) by mouth daily.   albuterol  (VENTOLIN  HFA) 108 (90 Base) MCG/ACT inhaler TAKE 2 PUFFS BY MOUTH EVERY 6 HOURS AS NEEDED FOR WHEEZE OR SHORTNESS OF BREATH (Patient not taking: Reported on 11/01/2023)   buPROPion  (WELLBUTRIN  XL) 300 MG 24 hr tablet Take 300 mg by mouth every morning. (Patient not taking: Reported on 11/01/2023)   doxazosin  (CARDURA ) 4 MG tablet Take 4 mg by mouth daily. (Patient not taking: Reported on 11/01/2023)   doxepin  (SINEQUAN ) 50 MG capsule Take 100 mg by mouth at bedtime. (Patient not taking: Reported on 11/01/2023)   spironolactone  (ALDACTONE ) 25 MG tablet Take 25 mg by mouth daily. (Patient not taking: Reported on 11/01/2023)   White Petrolatum-Mineral Oil (REFRESH P.M. OP)  Place 1 drop into both eyes every 6 (six) hours as needed (dry eyes). (Patient not taking: Reported on 11/01/2023)   No facility-administered encounter medications on file as of 11/01/2023.   Physical Exam: Blood pressure 125/85, pulse 82, height 5' 3 (1.6 m), weight 191 lb 3.2 oz (86.7 kg), SpO2 95%. Gen:      No acute distress HEENT:  EOMI, sclera anicteric Neck:     No masses; no thyromegaly Lungs:    Clear to auscultation bilaterally; normal respiratory effort CV:         Regular rate and rhythm; no murmurs Abd:      + bowel sounds; soft, non-tender; no palpable masses, no distension Ext:    No edema; adequate peripheral perfusion Neuro: alert and  oriented x 3 Psych: normal mood and affect   Data Reviewed: Imaging: CTA 10/08/2022-no PE, subsegmental atelectasis at the base Cardiac CT 12/16/2022-visualized lungs appear clear except for subsegmental atelectasis High resolution CT 08/13/2023-no evidence of interstitial lung disease, air trapping, tracheobronchomalacia, aortic, coronary atherosclerosis.-Pulmonary trunk I reviewed the images personally  PFTs: 06/11/2023 FVC 1.83 [86%], FEV1 1.29 [62%], F/F71, TLC 3.37 [68%], DLCO 14.07 [95%] Severe restriction, bronchodilator response  Labs: CBC 07/22/2023-WBC 7.3, absolute eosinophil count 58 IgE 07/20/2023-32 BNP 07/20/2023-57  Cardiac: Echocardiogram 11/16/2022-LVEF 55-60%, mild LVH, normal RV systolic size and function.  TR is not adequate for assessing PA pressure  Assessment & Plan Asthma Mild asthma with improvement on Breztri . CT scan shows air trapping, likely due to asthma rather than COPD, given minimal smoking history and lung function tests. Breathing test in January showed bronchodilator response, supporting asthma diagnosis. Breztri  has been beneficial, but additional control is needed. - Continue Breztri  daily - Start Singulair to enhance asthma control - Encourage aerobic exercise program to improve respiratory function  Restrictive lung disease Noted on PFTs Restrictive lung disease likely due to spinal hardware (52 screws and 3 rods) impacting lung capacity. No interstitial lung disease noted on CT scan.  Edema Persistent edema, possibly exacerbated by recent knee replacement. Diuretics not advised due to risk of hyponatremia. Normal BNP and echocardiogram in rule out heart failure as a cause.   Recommendations: Continue Breztri , start Singulair Exercise program  Jeronda Don MD [facility] 11/01/2023, 4:13 PM  CC: Austine Lefort, MD

## 2023-11-04 ENCOUNTER — Other Ambulatory Visit: Payer: Self-pay | Admitting: Family Medicine

## 2023-11-04 NOTE — Telephone Encounter (Signed)
 Copied from CRM [id number]. Topic: Clinical - Medication Refill >> Nov 04, 2023  9:02 AM Everlene Hobby D wrote: Medication:  oxyCODONE -acetaminophen  (PERCOCET) 5-325 MG tablet    Has the patient contacted their pharmacy? No (Agent: If no, request that the patient contact the pharmacy for the refill. If patient does not wish to contact the pharmacy document the reason why and proceed with request.) (Agent: If yes, when and what did the pharmacy advise?)  This is the patient's preferred pharmacy:  [id number] [place], [place] - [address] [place] [address] Phone: [phone] Fax: [phone]  Is this the correct pharmacy for this prescription? Yes If no, delete pharmacy and type the correct one.   Has the prescription been filled recently? No  Is the patient out of the medication? Yes  Has the patient been seen for an appointment in the last year OR does the patient have an upcoming appointment? Yes  Can we respond through MyChart? No  Agent: Please be advised that Rx refills may take up to 3 business days. We ask that you follow-up with your pharmacy.

## 2023-11-05 MED ORDER — OXYCODONE-ACETAMINOPHEN 5-325 MG PO TABS: 1.0000 | ORAL_TABLET | Freq: Three times a day (TID) | ORAL | 0 refills | Status: DC | PRN

## 2023-11-05 NOTE — Telephone Encounter (Signed)
 Requested medications are due for refill today.  yes  Requested medications are on the active medications list.  yes  Last refill. 10/07/2023 #40 [id number] rf  Future visit scheduled.   yes  Notes to clinic.  Refill not delegated.    Requested Prescriptions  Pending Prescriptions Disp Refills   oxyCODONE -acetaminophen  (PERCOCET) 5-325 MG tablet 40 tablet 0    Sig: Take 1 tablet by mouth every 8 (eight) hours as needed for severe pain (pain score 7-10). Ok to fill after 01/01/23     Not Delegated - Analgesics:  Opioid Agonist Combinations Failed - 11/05/2023  2:16 PM      Failed - This refill cannot be delegated      Failed - Urine Drug Screen completed in last 360 days      Passed - Valid encounter within last 3 months    Recent Outpatient Visits           1 month ago Cellulitis of ankle   [facility] [place] Medicine Amadeo June, MD   3 months ago Iron deficiency anemia, unspecified iron deficiency anemia type   [place] [facility] Austine Lefort, MD   5 months ago Anemia, unspecified type   [place] [facility] Austine Lefort, MD   7 months ago Acute cystitis without hematuria   [place] [facility] Jenelle Mis, FNP   7 months ago Dyspnea on exertion   [place] [facility] Medicine Pickard, Cisco Crest, MD       Future Appointments             In 4 months Eddie Good Arty Later [facility] Health Rheumatology - A Dept Of [place]. [facility]

## 2023-11-10 ENCOUNTER — Other Ambulatory Visit: Payer: Self-pay | Admitting: Family Medicine

## 2023-11-10 DIAGNOSIS — Z1231 Encounter for screening mammogram for malignant neoplasm of breast: Secondary | ICD-10-CM

## 2023-11-11 ENCOUNTER — Ambulatory Visit (INDEPENDENT_AMBULATORY_CARE_PROVIDER_SITE_OTHER): Admitting: Family Medicine

## 2023-11-11 ENCOUNTER — Encounter: Payer: Self-pay | Admitting: Family Medicine

## 2023-11-11 VITALS — BP 130/78 | HR 84 | Temp 98.1°F | Ht 63.0 in | Wt 191.0 lb

## 2023-11-11 DIAGNOSIS — R739 Hyperglycemia, unspecified: Secondary | ICD-10-CM | POA: Diagnosis not present

## 2023-11-11 MED ORDER — TIRZEPATIDE-WEIGHT MANAGEMENT 2.5 MG/0.5ML ~~LOC~~ SOLN: 2.5000 mg | SUBCUTANEOUS | 3 refills | Status: DC

## 2023-11-11 NOTE — Progress Notes (Signed)
 Subjective:    Patient ID: Janet Peterson, female    DOB: 09-Dec-1950, 73 y.o.   MRN: [id number]  HPI  Patient is requesting Zepbound for weight loss.  She has a history of hyperglycemia and has had prediabetes in the past but to my knowledge has never been diagnosed with type 2 diabetes.  She also does not have a history of coronary artery disease.  There is a questionable history of remote sleep apnea long ago however she has not been on treatment for years.  Neither her nor her husband feel that she still has sleep apnea.  Therefore she would only be taking Zepbound for weight loss.  This is currently not covered by Medicare.  We discussed the cash price which is around $300 and she would be interested in this.  Past Medical History:  Diagnosis Date   Anemia    Ankylosing spondylitis (HCC)    Anxiety and depression    Cancer (HCC)    basal cell skin cancer   Depression    Dyspnea    Fibromyalgia    GERD (gastroesophageal reflux disease)    Hiatal hernia    per patient, dx by GI    History of basal cell carcinoma excision    FACE, 1992  &  1996   History of hiatal hernia    History of thrush    Hyperlipidemia    Hypertension    Insomnia    Left breast mass    OCD (obsessive compulsive disorder)    Osteopenia    PONV (postoperative nausea and vomiting)    Psoriatic arthritis (HCC)    PVC (premature ventricular contraction)    Rheumatoid arthritis (HCC)    Past Surgical History:  Procedure Laterality Date   BIOPSY  09/29/2019   Procedure: BIOPSY;  Surgeon: Genell Ken, MD;  Location: [facility];  Service: Gastroenterology;;   BREAST EXCISIONAL BIOPSY Right    BREAST EXCISIONAL BIOPSY Left 05/04/2018   BREAST LUMPECTOMY WITH RADIOACTIVE SEED LOCALIZATION Left 05/04/2018   Procedure: LEFT BREAST LUMPECTOMY WITH RADIOACTIVE SEED LOCALIZATION AND LEFT BREAST NIPPLE BIOPSY;  Surgeon: Caralyn Chandler, MD;  Location: [facility];  Service: General;  Laterality: Left;    BREAST SURGERY  05/25/1973   lumpectomy-- benign   BUNIONECTOMY  05/25/1989   CATARACT EXTRACTION W/ INTRAOCULAR LENS  IMPLANT, BILATERAL  05/25/1998   CERVICAL FUSION  07/24/2011   C5 -- C7   CESAREAN SECTION  05/26/1983   COLONOSCOPY WITH PROPOFOL  N/A 09/29/2019   Procedure: COLONOSCOPY WITH PROPOFOL ;  Surgeon: Genell Ken, MD;  Location: [facility];  Service: Gastroenterology;  Laterality: N/A;   DISTAL INTERPHALANGEAL JOINT FUSION Right 03/14/2015   Procedure: RIGHT LONG FINGER DISTAL INTERPHALANGEAL JOINT ARTHRODESIS;  Surgeon: Arvil Birks, MD;  Location: [facility];  Service: Orthopedics;  Laterality: Right;   ESOPHAGOGASTRODUODENOSCOPY (EGD) WITH PROPOFOL  N/A 09/29/2019   Procedure: ESOPHAGOGASTRODUODENOSCOPY (EGD) WITH PROPOFOL ;  Surgeon: Genell Ken, MD;  Location: [facility];  Service: Gastroenterology;  Laterality: N/A;   EYE SURGERY  05/25/1993   rk (laser surgery), semi cornea transplant, detacted retina,  fluid removal   HYSTEROSCOPY WITH D & C N/A 12/09/2020   Procedure: DILATATION AND CURETTAGE /HYSTEROSCOPY;  Surgeon: Meldon Sport, DO;  Location: [facility];  Service: Gynecology;  Laterality: N/A;   KNEE ARTHROSCOPY Left 03/03/2004   POLYPECTOMY  09/29/2019   Procedure: POLYPECTOMY;  Surgeon: Genell Ken, MD;  Location: [facility];  Service: Gastroenterology;;   POSTERIOR VITRECTOMY  RIGHT EYE AND LASER   10/27/1999   right foot surgery      SHOULDER SURGERY Right 05/26/1995   SKIN BIOPSY  05/2022   lower stomach   SPINAL FIXATION SURGERY W/ IMPLANT  2013 rod #1//  2014  rod 2   S1 -- T10  (rod #1)//   S1 -- T4 (rod #2)   THORACIC FUSION  03/13/2013   REMOVAL HARDWARE/  BONE GRAFT FUSION T10//  REVISION OF RODS   TOTAL KNEE ARTHROPLASTY Left 12/14/2005   TOTAL KNEE ARTHROPLASTY Right 08/20/2023   Procedure: ARTHROPLASTY, KNEE, TOTAL;  Surgeon: Winston Hawking, MD;  Location: [facility];  Service: Orthopedics;  Laterality: Right;    UVULOPALATOPHARYNGOPLASTY  04/26/2006   w/  TONSILLECTOMY/  TURBINATE REDUCTIONS/  BILATERAL ANTERIOR ETHMOIDECTOMY   Current Outpatient Medications on File Prior to Visit  Medication Sig Dispense Refill   amLODipine  (NORVASC ) 5 MG tablet TAKE 1 TABLET (5 MG TOTAL) BY MOUTH DAILY. 90 tablet 1   Budeson-Glycopyrrol-Formoterol  (BREZTRI  AEROSPHERE) 160-9-4.8 MCG/ACT AERO Inhale 2 puffs into the lungs in the morning and at bedtime. 3 each 3   buPROPion  (WELLBUTRIN  XL) 300 MG 24 hr tablet Take 300 mg by mouth every morning.  1   clonazePAM  (KLONOPIN ) 1 MG tablet TAKE 1 TABLET BY MOUTH EVERY DAY AS NEEDED FOR ANXIETY 30 tablet 1   clotrimazole  (LOTRIMIN ) 1 % cream APPLY TO AFFECTED AREA TWICE A DAY 30 g 0   dexlansoprazole  (DEXILANT ) 60 MG capsule TAKE 1 CAPSULE BY MOUTH EVERY DAY 90 capsule 0   doxepin  (SINEQUAN ) 50 MG capsule Take 100 mg by mouth at bedtime.     Evolocumab  (REPATHA  SURECLICK) 140 MG/ML SOAJ Inject 140 mg into the skin every 14 (fourteen) days. 2 mL 11   ezetimibe  (ZETIA ) 10 MG tablet TAKE 1 TABLET BY MOUTH EVERY DAY 90 tablet 2   Ferrous Sulfate (IRON PO) Take by mouth.     FLUoxetine  (PROZAC ) 20 MG capsule Take 60 mg by mouth at bedtime.     fluticasone  (FLONASE ) 50 MCG/ACT nasal spray Place 2 sprays into both nostrils daily as needed for allergies. 16 g 11   INGREZZA  80 MG capsule Take one capsule by mouth daily (Patient taking differently: Take 80 mg by mouth daily.) 30 capsule 1   leflunomide  (ARAVA ) 20 MG tablet TAKE 1 TABLET BY MOUTH EVERY DAY 90 tablet 0   LINZESS  72 MCG capsule TAKE 1 CAPSULE BY MOUTH DAILY BEFORE BREAKFAST. (Patient taking differently: Take 72 mcg by mouth daily as needed (constipation).) 90 capsule 3   methocarbamol  (ROBAXIN ) 500 MG tablet Take 1 tablet (500 mg total) by mouth every 8 (eight) hours as needed for muscle spasms. 40 tablet 1   metoprolol  succinate (TOPROL -XL) 50 MG 24 hr tablet TAKE 1 TABLET BY MOUTH DAILY WITH A MEAL. 90 tablet 0    montelukast  (SINGULAIR ) 10 MG tablet Take 1 tablet (10 mg total) by mouth at bedtime. 30 tablet 11   ondansetron  (ZOFRAN ) 4 MG tablet Take 1 tablet (4 mg total) by mouth 4 (four) times daily as needed for nausea. 90 tablet 1   oxyCODONE -acetaminophen  (PERCOCET) 5-325 MG tablet Take 1 tablet by mouth every 8 (eight) hours as needed for severe pain (pain score 7-10). Ok to fill after 01/01/23 40 tablet 0   spironolactone  (ALDACTONE ) 25 MG tablet Take 25 mg by mouth daily. (Patient taking differently: Take 25 mg by mouth as needed.)     White Petrolatum-Mineral Oil (REFRESH P.M. OP) Place  1 drop into both eyes every 6 (six) hours as needed (dry eyes).     doxazosin  (CARDURA ) 4 MG tablet Take 4 mg by mouth daily. (Patient not taking: Reported on 11/11/2023)     ondansetron  (ZOFRAN ) 4 MG tablet Take 1 tablet (4 mg total) by mouth every 8 (eight) hours as needed. 30 tablet 1   valsartan  (DIOVAN ) 320 MG tablet Take 1 tablet (320 mg total) by mouth daily. 90 tablet 3   No current facility-administered medications on file prior to visit.   Allergies  Allergen Reactions   Latex Rash    Mouth sores   Amlodipine  Besy-Benazepril Hcl Cough    No angioedema   Amoxicillin-Pot Clavulanate Diarrhea and Nausea And Vomiting   Bextra [Valdecoxib] Diarrhea and Nausea And Vomiting    Acid reflux   Hydrochlorothiazide      Low sodium   Lipitor [Atorvastatin Calcium] Other (See Comments)    MYALGIAS   Spironolactone      Hyponatremia   Sulfa Antibiotics Nausea And Vomiting    Cramping and stomach hurts   Zocor [Simvastatin] Other (See Comments)    MYALGIA   Chlorhexidine  Rash    Rash all over body. Should not take topical or mouthwash   Social History   Socioeconomic History   Marital status: Married    Spouse name: Daryl   Number of children: Not on file   Years of education: Not on file   Highest education level: Bachelor's degree (e.g., BA, AB, BS)  Occupational History    Comment: retired  Tobacco  Use   Smoking status: Former    Current packs/day: 0.00    Average packs/day: 0.5 packs/day for 10.0 years (5.0 ttl pk-yrs)    Types: Cigarettes    Start date: 03/06/1985    Quit date: 03/07/1995    Years since quitting: 28.7    Passive exposure: Never   Smokeless tobacco: Never  Vaping Use   Vaping status: Never Used  Substance and Sexual Activity   Alcohol use: Yes    Comment: rare   Drug use: Never   Sexual activity: Not Currently  Other Topics Concern   Not on file  Social History Narrative   Lives with husband   Social Drivers of Health   Financial Resource Strain: Low Risk  (08/19/2023)   Overall Financial Resource Strain (CARDIA)    Difficulty of Paying Living Expenses: Not hard at all  Food Insecurity: No Food Insecurity (08/20/2023)   Hunger Vital Sign    Worried About Running Out of Food in the Last Year: Never true    Ran Out of Food in the Last Year: Never true  Transportation Needs: No Transportation Needs (08/20/2023)   PRAPARE - Administrator, Civil Service (Medical): No    Lack of Transportation (Non-Medical): No  Physical Activity: Inactive (08/19/2023)   Exercise Vital Sign    Days of Exercise per Week: 0 days    Minutes of Exercise per Session: 0 min  Stress: Stress Concern Present (08/19/2023)   [employer] of Occupational Health - Occupational Stress Questionnaire    Feeling of Stress : To some extent  Social Connections: Socially Integrated (08/20/2023)   Social Connection and Isolation Panel    Frequency of Communication with Friends and Family: Twice a week    Frequency of Social Gatherings with Friends and Family: Once a week    Attends Religious Services: More than 4 times per year    Active Member of [employer] or Organizations:  No    Attends Club or Organization Meetings: More than 4 times per year    Marital Status: Married  Catering manager Violence: Not At Risk (08/20/2023)   Humiliation, Afraid, Rape, and Kick questionnaire     Fear of Current or Ex-Partner: No    Emotionally Abused: No    Physically Abused: No    Sexually Abused: No    Review of Systems  All other systems reviewed and are negative.      Objective:   Physical Exam Vitals reviewed.  Constitutional:      Appearance: Normal appearance.   Cardiovascular:     Rate and Rhythm: Normal rate and regular rhythm.     Heart sounds: Normal heart sounds.  Pulmonary:     Effort: Pulmonary effort is normal.     Breath sounds: Normal breath sounds.   Skin:    Findings: Rash is not crusting, papular, pustular or vesicular.   Neurological:     Mental Status: She is alert.           Assessment & Plan:   High blood sugar - Plan: Hemoglobin A1c, Comprehensive metabolic panel with GFR I will check hemoglobin A1c along with CMP.  If the patient has diabetes we can certainly try to get Mounjaro approved for treatment.  Absent this, the patient will have to pay cash for Zepbound.  She request that I send Zepbound to the self-pay pharmacy.  I will start the patient on 2.5 mg subcu weekly and uptitrate as tolerated

## 2023-11-12 ENCOUNTER — Ambulatory Visit: Payer: Self-pay | Admitting: Family Medicine

## 2023-11-12 LAB — COMPREHENSIVE METABOLIC PANEL WITH GFR
AG Ratio: 1.9 (calc) (ref 1.0–2.5)
ALT: 8 U/L (ref 6–29)
AST: 14 U/L (ref 10–35)
Albumin: 4 g/dL (ref 3.6–5.1)
Alkaline phosphatase (APISO): 115 U/L (ref 37–153)
BUN/Creatinine Ratio: 11 (calc) (ref 6–22)
BUN: 11 mg/dL (ref 7–25)
CO2: 26 mmol/L (ref 20–32)
Calcium: 10.2 mg/dL (ref 8.6–10.4)
Chloride: 98 mmol/L (ref 98–110)
Creat: 1.04 mg/dL — ABNORMAL HIGH (ref 0.60–1.00)
Globulin: 2.1 g/dL (ref 1.9–3.7)
Glucose, Bld: 93 mg/dL (ref 65–99)
Potassium: 4.7 mmol/L (ref 3.5–5.3)
Sodium: 132 mmol/L — ABNORMAL LOW (ref 135–146)
Total Bilirubin: 0.3 mg/dL (ref 0.2–1.2)
Total Protein: 6.1 g/dL (ref 6.1–8.1)
eGFR: 57 mL/min/{1.73_m2} — ABNORMAL LOW (ref >=60)

## 2023-11-12 LAB — HEMOGLOBIN A1C
Hgb A1c MFr Bld: 5.4 % (ref <5.7)
Mean Plasma Glucose: 108 mg/dL
eAG (mmol/L): 6 mmol/L

## 2023-11-17 ENCOUNTER — Ambulatory Visit

## 2023-11-17 ENCOUNTER — Other Ambulatory Visit: Payer: Self-pay | Admitting: Family Medicine

## 2023-11-18 ENCOUNTER — Telehealth: Payer: Self-pay

## 2023-11-18 ENCOUNTER — Other Ambulatory Visit: Payer: Self-pay | Admitting: Family Medicine

## 2023-11-18 NOTE — Telephone Encounter (Signed)
 Copied from CRM [id number]. Topic: Clinical - Medication Question >> Nov 18, 2023  2:52 PM Sasha H wrote: Reason for CRM: Pt is wanting her provider to increase her dose of Zepbound  significantly.

## 2023-11-19 ENCOUNTER — Other Ambulatory Visit: Payer: Self-pay | Admitting: Family Medicine

## 2023-11-19 NOTE — Telephone Encounter (Signed)
 Requested Prescriptions  Pending Prescriptions Disp Refills   metoprolol  succinate (TOPROL -XL) 50 MG 24 hr tablet [Pharmacy Med Name: METOPROLOL  SUCC ER 50 MG TAB] 90 tablet 0    Sig: TAKE 1 TABLET BY MOUTH EVERY DAY WITH A MEAL     Cardiovascular:  Beta Blockers Passed - 11/19/2023 10:55 AM      Passed - Last BP in normal range    BP Readings from Last 1 Encounters:  11/11/23 130/78         Passed - Last Heart Rate in normal range    Pulse Readings from Last 1 Encounters:  11/11/23 84         Passed - Valid encounter within last 6 months    Recent Outpatient Visits           1 week ago High blood sugar   [place] [facility] Duanne, Butler DASEN, MD   1 month ago Cellulitis of ankle   [facility] Aletha Bene, MD   4 months ago Iron deficiency anemia, unspecified iron deficiency anemia type   [place] [facility] Duanne Butler DASEN, MD   5 months ago Anemia, unspecified type    [facility] Duanne Butler DASEN, MD   7 months ago Acute cystitis without hematuria    [facility] Kayla Jeoffrey RAMAN, FNP       Future Appointments             In 3 months Cheryl, Waddell CHRISTELLA RIGGERS [facility] Rheumatology - A Dept Of [place]. [facility]

## 2023-11-25 ENCOUNTER — Ambulatory Visit
Admission: RE | Admit: 2023-11-25 | Discharge: 2023-11-25 | Disposition: A | Discharge status: Home / Self Care | Source: Ambulatory Visit | Attending: Family Medicine | Admitting: Family Medicine

## 2023-11-25 DIAGNOSIS — Z1231 Encounter for screening mammogram for malignant neoplasm of breast: Secondary | ICD-10-CM

## 2023-12-02 ENCOUNTER — Telehealth: Payer: Self-pay | Admitting: Family Medicine

## 2023-12-02 NOTE — Telephone Encounter (Signed)
 Copied from CRM [id number]. Topic: Clinical - Medication Refill >> Dec 02, 2023 11:48 AM Avram MATSU wrote: Medication: oxyCODONE -acetaminophen  (PERCOCET) 5-325 MG tablet [[id number]]  Has the patient contacted their pharmacy? Yes (Agent: If no, request that the patient contact the pharmacy for the refill. If patient does not wish to contact the pharmacy document the reason why and proceed with request.) (Agent: If yes, when and what did the pharmacy advise?)  This is the patient's preferred pharmacy:  [id number] [place], [place] - [address] [place] [address] Phone: [phone] Fax: [phone]    Is this the correct pharmacy for this prescription? Yes If no, delete pharmacy and type the correct one.   Has the prescription been filled recently? No  Is the patient out of the medication? No 4 pills left  Has the patient been seen for an appointment in the last year OR does the patient have an upcoming appointment? Yes  Can we respond through MyChart? Yes  Agent: Please be advised that Rx refills may take up to 3 business days. We ask that you follow-up with your pharmacy.

## 2023-12-03 NOTE — Telephone Encounter (Signed)
 Requested medications are due for refill today.  yes  Requested medications are on the active medications list.  yes  Last refill. 11/05/2023 #40 0 rf  Future visit scheduled.   yes  Notes to clinic.  Refill not delegated.    Requested Prescriptions  Pending Prescriptions Disp Refills   oxyCODONE -acetaminophen  (PERCOCET) 5-325 MG tablet 40 tablet 0    Sig: Take 1 tablet by mouth every 8 (eight) hours as needed for severe pain (pain score 7-10). Ok to fill after 01/01/23     Not Delegated - Analgesics:  Opioid Agonist Combinations Failed - 12/03/2023  3:55 PM      Failed - This refill cannot be delegated      Failed - Urine Drug Screen completed in last 360 days      Passed - Valid encounter within last 3 months    Recent Outpatient Visits           3 weeks ago High blood sugar   [place] [facility] Duanne Butler DASEN, MD   2 months ago Cellulitis of ankle   [facility] Aletha Bene, MD   4 months ago Iron deficiency anemia, unspecified iron deficiency anemia type   [place] [facility] Duanne Butler DASEN, MD   5 months ago Anemia, unspecified type   [place] [facility] Duanne Butler DASEN, MD   8 months ago Acute cystitis without hematuria   [place] [facility] Kayla Jeoffrey RAMAN, FNP       Future Appointments             In 3 months Cheryl, Waddell CHRISTELLA RIGGERS [facility] Rhe[facility] - A Dept Of [place]. [facility]

## 2023-12-04 ENCOUNTER — Other Ambulatory Visit: Payer: Self-pay | Admitting: Physician Assistant

## 2023-12-06 ENCOUNTER — Other Ambulatory Visit: Payer: Self-pay | Admitting: Family Medicine

## 2023-12-06 MED ORDER — OXYCODONE-ACETAMINOPHEN 5-325 MG PO TABS: 1.0000 | ORAL_TABLET | Freq: Three times a day (TID) | ORAL | 0 refills | Status: DC | PRN

## 2023-12-06 NOTE — Telephone Encounter (Addendum)
 Last Fill: 08/04/2023  Labs: 11/11/2023 CMP  Creat 1.04 eGFR 57 Sodium 132 07/22/2023 CBC MCHC 31.8  Contacted patient to update a CBC   Next Visit: 03/08/2024  Last Visit: 10/07/2023  DX: Psoriatic arthritis    Current Dose per office note 10/07/2023: Arava  20 mg 1 tablet by mouth daily   Okay to refill Arava  ?

## 2023-12-06 NOTE — Telephone Encounter (Signed)
 Copied from CRM [id number]. Topic: Clinical - Medication Refill >> Dec 02, 2023 11:48 AM Avram MATSU wrote: Medication: oxyCODONE -acetaminophen  (PERCOCET) 5-325 MG tablet [[id number]]  Has the patient contacted their pharmacy? Yes (Agent: If no, request that the patient contact the pharmacy for the refill. If patient does not wish to contact the pharmacy document the reason why and proceed with request.) (Agent: If yes, when and what did the pharmacy advise?)  This is the patient's preferred pharmacy:  [id number] [place], [place] - [address] [place] [address] Phone: [phone] Fax: [phone]    Is this the correct pharmacy for this prescription? Yes If no, delete pharmacy and type the correct one.   Has the prescription been filled recently? No  Is the patient out of the medication? No 4 pills left  Has the patient been seen for an appointment in the last year OR does the patient have an upcoming appointment? Yes  Can we respond through MyChart? Yes  Agent: Please be advised that Rx refills may take up to 3 business days. We ask that you follow-up with your pharmacy. >> Dec 06, 2023 11:19 AM Larissa S wrote: Patient calling to check the status of med refill.

## 2023-12-06 NOTE — Telephone Encounter (Signed)
 Requested medications are due for refill today.  yes  Requested medications are on the active medications list.  yes  Last refill. 11/05/2023 #40 0 rf  Future visit scheduled.   yes  Notes to clinic.  Refill not delegated.    Requested Prescriptions  Pending Prescriptions Disp Refills   oxyCODONE -acetaminophen  (PERCOCET) 5-325 MG tablet 40 tablet 0    Sig: Take 1 tablet by mouth every 8 (eight) hours as needed for severe pain (pain score 7-10). Ok to fill after 01/01/23     Not Delegated - Analgesics:  Opioid Agonist Combinations Failed - 12/06/2023 11:26 AM      Failed - This refill cannot be delegated      Failed - Urine Drug Screen completed in last 360 days      Passed - Valid encounter within last 3 months    Recent Outpatient Visits           3 weeks ago High blood sugar   [place] [facility] Duanne Butler DASEN, MD   2 months ago Cellulitis of ankle   [place] [facility] Aletha Bene, MD   4 months ago Iron deficiency anemia, unspecified iron deficiency anemia type   [facility] Duanne Butler DASEN, MD   6 months ago Anemia, unspecified type   [place] [facility] Duanne Butler DASEN, MD   8 months ago Acute cystitis without hematuria   [place] [facility] Kayla Jeoffrey RAMAN, FNP       Future Appointments             In 3 months Cheryl, W[place]ell CHRISTELLA RIGGERS [facility] Rheumatology - A Dept Of [place]. [facility]

## 2023-12-07 ENCOUNTER — Encounter: Payer: Self-pay | Admitting: Family Medicine

## 2023-12-07 ENCOUNTER — Ambulatory Visit (INDEPENDENT_AMBULATORY_CARE_PROVIDER_SITE_OTHER): Admitting: Family Medicine

## 2023-12-07 ENCOUNTER — Other Ambulatory Visit: Payer: Self-pay | Admitting: Family Medicine

## 2023-12-07 VITALS — BP 130/76 | HR 80 | Temp 97.9°F | Ht 63.0 in | Wt 195.0 lb

## 2023-12-07 DIAGNOSIS — E669 Obesity, unspecified: Secondary | ICD-10-CM

## 2023-12-07 DIAGNOSIS — L4050 Arthropathic psoriasis, unspecified: Secondary | ICD-10-CM

## 2023-12-07 MED ORDER — TIRZEPATIDE-WEIGHT MANAGEMENT 5 MG/0.5ML ~~LOC~~ SOLN: 5.0000 mg | SUBCUTANEOUS | 3 refills | Status: DC

## 2023-12-07 MED ORDER — OXYCODONE-ACETAMINOPHEN 5-325 MG PO TABS: 1.0000 | ORAL_TABLET | Freq: Three times a day (TID) | ORAL | 0 refills | Status: DC | PRN

## 2023-12-07 NOTE — Progress Notes (Signed)
 Subjective:    Patient ID: Janet Peterson, female    DOB: 24-May-1951, 73 y.o.   MRN: [id number]  HPI  Patient recently started Zepbound  2.5 mg subcu weekly.  She wants to increase to 7 mg.  This is primarily due to cost and the fact that she is experiencing weight gain ever since stopping Ozempic .  She is having to pay cash for the medication and does not want to waste the money slowly uptitrating the Zepbound  to a higher dose. Past Medical History:  Diagnosis Date   Anemia    Ankylosing spondylitis (HCC)    Anxiety and depression    Cancer (HCC)    basal cell skin cancer   Depression    Dyspnea    Fibromyalgia    GERD (gastroesophageal reflux disease)    Hiatal hernia    per patient, dx by GI    History of basal cell carcinoma excision    FACE, 1992  &  1996   History of hiatal hernia    History of thrush    Hyperlipidemia    Hypertension    Insomnia    Left breast mass    OCD (obsessive compulsive disorder)    Osteopenia    PONV (postoperative nausea and vomiting)    Psoriatic arthritis (HCC)    PVC (premature ventricular contraction)    Rheumatoid arthritis (HCC)    Past Surgical History:  Procedure Laterality Date   BIOPSY  09/29/2019   Procedure: BIOPSY;  Surgeon: Saintclair Jasper, MD;  Location: [facility];  Service: Gastroenterology;;   BREAST EXCISIONAL BIOPSY Right    BREAST EXCISIONAL BIOPSY Left 05/04/2018   BREAST LUMPECTOMY WITH RADIOACTIVE SEED LOCALIZATION Left 05/04/2018   Procedure: LEFT BREAST LUMPECTOMY WITH RADIOACTIVE SEED LOCALIZATION AND LEFT BREAST NIPPLE BIOPSY;  Surgeon: Curvin Deward MOULD, MD;  Location: [facility];  Service: General;  Laterality: Left;   BREAST SURGERY  05/25/1973   lumpectomy-- benign   BUNIONECTOMY  05/25/1989   CATARACT EXTRACTION W/ INTRAOCULAR LENS  IMPLANT, BILATERAL  05/25/1998   CERVICAL FUSION  07/24/2011   C5 -- C7   CESAREAN SECTION  05/26/1983   COLONOSCOPY WITH PROPOFOL  N/A 09/29/2019   Procedure:  COLONOSCOPY WITH PROPOFOL ;  Surgeon: Saintclair Jasper, MD;  Location: [facility];  Service: Gastroenterology;  Laterality: N/A;   DISTAL INTERPHALANGEAL JOINT FUSION Right 03/14/2015   Procedure: RIGHT LONG FINGER DISTAL INTERPHALANGEAL JOINT ARTHRODESIS;  Surgeon: Prentice Pagan, MD;  Location: [facility];  Service: Orthopedics;  Laterality: Right;   ESOPHAGOGASTRODUODENOSCOPY (EGD) WITH PROPOFOL  N/A 09/29/2019   Procedure: ESOPHAGOGASTRODUODENOSCOPY (EGD) WITH PROPOFOL ;  Surgeon: Saintclair Jasper, MD;  Location: [facility];  Service: Gastroenterology;  Laterality: N/A;   EYE SURGERY  05/25/1993   rk (laser surgery), semi cornea transplant, detacted retina,  fluid removal   HYSTEROSCOPY WITH D & C N/A 12/09/2020   Procedure: DILATATION AND CURETTAGE /HYSTEROSCOPY;  Surgeon: Storm Setter, DO;  Location:  SUR[facility]ERY C[facility];  Service: Gynecology;  Laterality: N/A;   KNEE ARTHROSCOPY Left 03/03/2004   POLYPECTOMY  09/29/2019   Procedure: POLYPECTOMY;  Surgeon: Saintclair Jasper, MD;  Location: [facility];  Service: Gastroenterology;;   POSTERIOR VITRECTOMY RIGHT EYE AND LASER   10/27/1999   right foot surgery      SHOULDER SURGERY Right 05/26/1995   SKIN BIOPSY  05/2022   lower stomach   SPINAL FIXATION SURGERY W/ IMPLANT  2013 rod #1//  2014  rod 2   S1 -- T10  (rod #  1)//   S1 -- T4 (rod #2)   THORACIC FUSION  03/13/2013   REMOVAL HARDWARE/  BONE GRAFT FUSION T10//  REVISION OF RODS   TOTAL KNEE ARTHROPLASTY Left 12/14/2005   TOTAL KNEE ARTHROPLASTY Right 08/20/2023   Procedure: ARTHROPLASTY, KNEE, TOTAL;  Surgeon: Kay Kemps, MD;  Location: [facility];  Service: Orthopedics;  Laterality: Right;   UVULOPALATOPHARYNGOPLASTY  04/26/2006   w/  TONSILLECTOMY/  TURBINATE REDUCTIONS/  BILATERAL ANTERIOR ETHMOIDECTOMY   Current Outpatient Medications on File Prior to Visit  Medication Sig Dispense Refill   amLODipine  (NORVASC ) 5 MG tablet TAKE 1 TABLET (5 MG TOTAL) BY MOUTH DAILY. 90  tablet 1   Budeson-Glycopyrrol-Formoterol  (BREZTRI  AEROSPHERE) 160-9-4.8 MCG/ACT AERO Inhale 2 puffs into the lungs in the morning and at bedtime. 3 each 3   buPROPion  (WELLBUTRIN  XL) 300 MG 24 hr tablet Take 300 mg by mouth every morning.  1   clonazePAM  (KLONOPIN ) 1 MG tablet TAKE 1 TABLET BY MOUTH EVERY DAY AS NEEDED FOR ANXIETY 30 tablet 1   clotrimazole  (LOTRIMIN ) 1 % cream APPLY TO AFFECTED AREA TWICE A DAY 30 g 0   cyclobenzaprine  (FLEXERIL ) 10 MG tablet TAKE 1 TABLET BY MOUTH THREE TIMES A DAY AS NEEDED FOR MUSCLE SPASMS 30 tablet 0   dexlansoprazole  (DEXILANT ) 60 MG capsule TAKE 1 CAPSULE BY MOUTH EVERY DAY 90 capsule 0   doxazosin  (CARDURA ) 4 MG tablet TAKE 1 TABLET BY MOUTH EVERY DAY 90 tablet 1   doxepin  (SINEQUAN ) 50 MG capsule Take 100 mg by mouth at bedtime.     Evolocumab  (REPATHA  SURECLICK) 140 MG/ML SOAJ Inject 140 mg into the skin every 14 (fourteen) days. 2 mL 11   ezetimibe  (ZETIA ) 10 MG tablet TAKE 1 TABLET BY MOUTH EVERY DAY 90 tablet 2   Ferrous Sulfate (IRON PO) Take by mouth.     FLUoxetine  (PROZAC ) 20 MG capsule Take 60 mg by mouth at bedtime.     fluticasone  (FLONASE ) 50 MCG/ACT nasal spray Place 2 sprays into both nostrils daily as needed for allergies. 16 g 11   INGREZZA  80 MG capsule Take one capsule by mouth daily (Patient taking differently: Take 80 mg by mouth daily.) 30 capsule 1   leflunomide  (ARAVA ) 20 MG tablet TAKE 1 TABLET BY MOUTH EVERY DAY 90 tablet 0   LINZESS  72 MCG capsule TAKE 1 CAPSULE BY MOUTH DAILY BEFORE BREAKFAST. (Patient taking differently: Take 72 mcg by mouth daily as needed (constipation).) 90 capsule 3   methocarbamol  (ROBAXIN ) 500 MG tablet Take 1 tablet (500 mg total) by mouth every 8 (eight) hours as needed for muscle spasms. 40 tablet 1   metoprolol  succinate (TOPROL -XL) 50 MG 24 hr tablet TAKE 1 TABLET BY MOUTH EVERY DAY WITH A MEAL 90 tablet 0   montelukast  (SINGULAIR ) 10 MG tablet Take 1 tablet (10 mg total) by mouth at bedtime. 30  tablet 11   ondansetron  (ZOFRAN ) 4 MG tablet Take 1 tablet (4 mg total) by mouth 4 (four) times daily as needed for nausea. 90 tablet 1   ondansetron  (ZOFRAN ) 4 MG tablet Take 1 tablet (4 mg total) by mouth every 8 (eight) hours as needed. 30 tablet 1   spironolactone  (ALDACTONE ) 25 MG tablet Take 25 mg by mouth daily. (Patient taking differently: Take 25 mg by mouth as needed.)     valsartan  (DIOVAN ) 320 MG tablet Take 1 tablet (320 mg total) by mouth daily. 90 tablet 3   White Petrolatum-Mineral Oil (REFRESH P.M. OP) Place 1  drop into both eyes every 6 (six) hours as needed (dry eyes).     No current facility-administered medications on file prior to visit.   Allergies  Allergen Reactions   Latex Rash    Mouth sores   Amlodipine  Besy-Benazepril Hcl Cough    No angioedema   Amoxicillin-Pot Clavulanate Diarrhea and Nausea And Vomiting   Bextra [Valdecoxib] Diarrhea and Nausea And Vomiting    Acid reflux   Hydrochlorothiazide      Low sodium   Lipitor [Atorvastatin Calcium] Other (See Comments)    MYALGIAS   Spironolactone      Hyponatremia   Sulfa Antibiotics Nausea And Vomiting    Cramping and stomach hurts   Zocor [Simvastatin] Other (See Comments)    MYALGIA   Chlorhexidine  Rash    Rash all over body. Should not take topical or mouthwash   Social History   Socioeconomic History   Marital status: Married    Spouse name: Daryl   Number of children: Not on file   Years of education: Not on file   Highest education level: Bachelor's degree (e.g., BA, AB, BS)  Occupational History    Comment: retired  Tobacco Use   Smoking status: Former    Current packs/day: 0.00    Average packs/day: 0.5 packs/day for 10.0 years (5.0 ttl pk-yrs)    Types: Cigarettes    Start date: 03/06/1985    Quit date: 03/07/1995    Years since quitting: 28.7    Passive exposure: Never   Smokeless tobacco: Never  Vaping Use   Vaping status: Never Used  Substance and Sexual Activity   Alcohol  use: Yes    Comment: rare   Drug use: Never   Sexual activity: Not Currently  Other Topics Concern   Not on file  Social History Narrative   Lives with husband   Social Drivers of Health   Financial Resource Strain: Low Risk  (08/19/2023)   Overall Financial Resource Strain (CARDIA)    Difficulty of Paying Living Expenses: Not hard at all  Food Insecurity: No Food Insecurity (08/20/2023)   Hunger Vital Sign    Worried About Running Out of Food in the Last Year: Never true    Ran Out of Food in the Last Year: Never true  Transportation Needs: No Transportation Needs (08/20/2023)   PRAPARE - Administrator, Civil Service (Medical): No    Lack of Transportation (Non-Medical): No  Physical Activity: Inactive (08/19/2023)   Exercise Vital Sign    Days of Exercise per Week: 0 days    Minutes of Exercise per Session: 0 min  Stress: Stress Concern Present (08/19/2023)   [employer] of Occupational Health - Occupational Stress Questionnaire    Feeling of Stress : To some extent  Social Connections: Socially Integrated (08/20/2023)   Social Connection and Isolation Panel    Frequency of Communication with Friends and Family: Twice a week    Frequency of Social Gatherings with Friends and Family: Once a week    Attends Religious Services: More than 4 times per year    Active Member of [employer] or Organizations: No    Attends Engineer, structural: More than 4 times per year    Marital Status: Married  Catering manager Violence: Not At Risk (08/20/2023)   Humiliation, Afraid, Rape, and Kick questionnaire    Fear of Current or Ex-Partner: No    Emotionally Abused: No    Physically Abused: No    Sexually Abused: No  Review of Systems  All other systems reviewed and are negative.      Objective:   Physical Exam Vitals reviewed.  Constitutional:      Appearance: Normal appearance.  Cardiovascular:     Rate and Rhythm: Normal rate and regular rhythm.     Heart  sounds: Normal heart sounds.  Pulmonary:     Effort: Pulmonary effort is normal.     Breath sounds: Normal breath sounds.  Skin:    Findings: Rash is not crusting, papular, pustular or vesicular.  Neurological:     Mental Status: She is alert.           Assessment & Plan:   Psoriatic arthritis (HCC) - Plan: CBC with Differential/Platelet, Comprehensive metabolic panel with GFR  Obesity (BMI 30-39.9) I explained that rapid increases in the medication can cause severe nausea and vomiting.  I do have 2 weeks worth of samples which I gave her a 5 mg of Mounjaro .  She can try these weekly for the next 2 weeks.  If she tolerates this I would be willing to send in 7.5 mg of Zepbound  to the compounding pharmacy.  If she does not tolerate this, she will need to continue with 5 mg weekly until the nausea improves and slowly uptitrate the medication as tolerated

## 2023-12-09 ENCOUNTER — Encounter (INDEPENDENT_AMBULATORY_CARE_PROVIDER_SITE_OTHER): Payer: Self-pay | Admitting: Otolaryngology

## 2023-12-09 ENCOUNTER — Ambulatory Visit: Payer: Self-pay | Admitting: Family Medicine

## 2023-12-09 ENCOUNTER — Ambulatory Visit (INDEPENDENT_AMBULATORY_CARE_PROVIDER_SITE_OTHER): Admitting: Otolaryngology

## 2023-12-09 VITALS — BP 110/72 | HR 88

## 2023-12-09 DIAGNOSIS — J310 Chronic rhinitis: Secondary | ICD-10-CM

## 2023-12-09 DIAGNOSIS — R0981 Nasal congestion: Secondary | ICD-10-CM | POA: Diagnosis not present

## 2023-12-09 DIAGNOSIS — H903 Sensorineural hearing loss, bilateral: Secondary | ICD-10-CM | POA: Diagnosis not present

## 2023-12-09 DIAGNOSIS — H6123 Impacted cerumen, bilateral: Secondary | ICD-10-CM | POA: Diagnosis not present

## 2023-12-09 DIAGNOSIS — H6983 Other specified disorders of Eustachian tube, bilateral: Secondary | ICD-10-CM

## 2023-12-12 DIAGNOSIS — H6983 Other specified disorders of Eustachian tube, bilateral: Secondary | ICD-10-CM | POA: Insufficient documentation

## 2023-12-12 DIAGNOSIS — J310 Chronic rhinitis: Secondary | ICD-10-CM | POA: Insufficient documentation

## 2023-12-12 NOTE — Progress Notes (Signed)
 Patient ID: Janet Peterson, female   DOB: 03/14/1951, 73 y.o.   MRN: [id number]  Follow-up: Hearing loss, chronic nasal congestion, eustachian tube dysfunction  HPI: The patient is a 73 year old female who returns today for her follow-up evaluation.  The patient has a history of bilateral high-frequency sensorineural hearing loss, eustachian tube dysfunction, and chronic nasal congestion.  The patient previously underwent septoplasty and bilateral turbinate reduction surgery.  She returns today reporting no significant change in her hearing.  She has noted intermittent nasal congestion and clogging sensation in her ears.  She denies any significant otalgia, otorrhea, facial pain, or fever.  Exam: General: Communicates without difficulty, well nourished, no acute distress. Head: Normocephalic, no evidence injury, no tenderness, facial buttresses intact without stepoff. Face/sinus: No tenderness to palpation and percussion. Facial movement is normal and symmetric. Eyes: PERRL, EOMI. No scleral icterus, conjunctivae clear. Neuro: CN II exam reveals vision grossly intact.  No nystagmus at any point of gaze. Ears: Auricles well formed without lesions.  Bilateral cerumen impaction.  Nose: External evaluation reveals normal support and skin without lesions.  Dorsum is intact.  Anterior rhinoscopy reveals congested mucosa over anterior aspect of inferior turbinates and intact septum.  No purulence noted. Oral:  Oral cavity and oropharynx are intact, symmetric, without erythema or edema.  Mucosa is moist without lesions. Neck: Full range of motion without pain.  There is no significant lymphadenopathy.  No masses palpable.  Thyroid bed within normal limits to palpation.  Parotid glands and submandibular glands equal bilaterally without mass.  Trachea is midline. Neuro:  CN 2-12 grossly intact.   Procedure: Bilateral cerumen disimpaction Anesthesia: None Description: Under the operating microscope, the cerumen is  carefully removed with a combination of cerumen currette, alligator forceps, and suction catheters.  After the cerumen is removed, the TMs are noted to be normal.  No mass, erythema, or lesions. The patient tolerated the procedure well.    Assessment: 1.  Bilateral recurrent cerumen impaction.  After the disimpaction procedure, both tympanic membranes and middle ear spaces are noted to be normal. 2.  Chronic rhinitis with nasal mucosal congestion.  The septum and turbinates are well-healed. 3.  Subjectively stable bilateral high-frequency sensorineural hearing loss.  Plan: 1.  The physical exam findings are reviewed with the patient. 2.  Otomicroscopy with bilateral cerumen disimpaction. 3.  Flonase  nasal spray 2 sprays each nostril daily. 4.  The patient will return for reevaluation in 6 months, sooner if needed.

## 2023-12-14 MED ORDER — OXYCODONE-ACETAMINOPHEN 5-325 MG PO TABS
1.0000 | ORAL_TABLET | Freq: Three times a day (TID) | ORAL | 0 refills | Status: DC | PRN
Start: 2023-12-14 — End: 2024-01-04

## 2023-12-19 ENCOUNTER — Other Ambulatory Visit: Payer: Self-pay | Admitting: Family Medicine

## 2023-12-23 ENCOUNTER — Ambulatory Visit: Admitting: Family Medicine

## 2023-12-23 ENCOUNTER — Encounter: Payer: Self-pay | Admitting: Family Medicine

## 2023-12-23 VITALS — BP 126/78 | HR 73 | Temp 97.7°F | Ht 63.0 in | Wt 199.0 lb

## 2023-12-23 DIAGNOSIS — E222 Syndrome of inappropriate secretion of antidiuretic hormone: Secondary | ICD-10-CM

## 2023-12-23 DIAGNOSIS — J302 Other seasonal allergic rhinitis: Secondary | ICD-10-CM | POA: Diagnosis not present

## 2023-12-23 MED ORDER — DEXLANSOPRAZOLE 60 MG PO CPDR: 60.0000 mg | DELAYED_RELEASE_CAPSULE | Freq: Every day | ORAL | 3 refills | Status: AC

## 2023-12-23 MED ORDER — FLUTICASONE PROPIONATE 50 MCG/ACT NA SUSP: 2.0000 | Freq: Every day | NASAL | 11 refills | Status: AC | PRN

## 2023-12-23 MED ORDER — FLUTICASONE PROPIONATE 50 MCG/ACT NA SUSP: 2.0000 | Freq: Every day | NASAL | 11 refills | Status: DC | PRN

## 2023-12-23 MED ORDER — ZEPBOUND 7.5 MG/0.5ML ~~LOC~~ SOLN: 7.5000 mg | SUBCUTANEOUS | 5 refills | Status: DC

## 2023-12-23 NOTE — Progress Notes (Signed)
 Subjective:    Patient ID: Janet Peterson, female    DOB: 06-08-1950, 73 y.o.   MRN: [id number]  HPI Patient has a history of leg swelling.  She is currently on amlodipine , valsartan , and metoprolol  for hypertension.  She is not taking doxazosin  because the medicine effect for.  Amlodipine  may help with blood pressure.  Fortunately adding to legs when.  Her cardiologist and her pulmonologist are asking if she can resume a diuretic to be taken for the leg swelling.  She has a history of profound hyponatremia on hydrochlorothiazide  and on spironolactone . Past Medical History:  Diagnosis Date   Anemia    Ankylosing spondylitis (HCC)    Anxiety and depression    Cancer (HCC)    basal cell skin cancer   Depression    Dyspnea    Fibromyalgia    GERD (gastroesophageal reflux disease)    Hiatal hernia    per patient, dx by GI    History of basal cell carcinoma excision    FACE, 1992  &  1996   History of hiatal hernia    History of thrush    Hyperlipidemia    Hypertension    Insomnia    Left breast mass    OCD (obsessive compulsive disorder)    Osteopenia    PONV (postoperative nausea and vomiting)    Psoriatic arthritis (HCC)    PVC (premature ventricular contraction)    Rheumatoid arthritis (HCC)    Past Surgical History:  Procedure Laterality Date   BIOPSY  09/29/2019   Procedure: BIOPSY;  Surgeon: Saintclair Jasper, MD;  Location: [facility];  Service: Gastroenterology;;   BREAST EXCISIONAL BIOPSY Right    BREAST EXCISIONAL BIOPSY Left 05/04/2018   BREAST LUMPECTOMY WITH RADIOACTIVE SEED LOCALIZATION Left 05/04/2018   Procedure: LEFT BREAST LUMPECTOMY WITH RADIOACTIVE SEED LOCALIZATION AND LEFT BREAST NIPPLE BIOPSY;  Surgeon: Curvin Deward MOULD, MD;  Location:  SURGERY CENTER;  Service: General;  Laterality: Left;   BREAST SURGERY  05/25/1973   lumpectomy-- benign   BUNIONECTOMY  05/25/1989   CATARACT EXTRACTION W/ INTRAOCULAR LENS  IMPLANT, BILATERAL  05/25/1998   CERVICAL  FUSION  07/24/2011   C5 -- C7   CESAREAN SECTION  05/26/1983   COLONOSCOPY WITH PROPOFOL  N/A 09/29/2019   Procedure: COLONOSCOPY WITH PROPOFOL ;  Surgeon: Saintclair Jasper, MD;  [facility];  Service: Gastroenterology;  Laterality: N/A;   DISTAL INTERPHALANGEAL JOINT FUSION Right 03/14/2015   Procedure: RIGHT LONG FINGER DISTAL INTERPHALANGEAL JOINT ARTHRODESIS;  Surgeon: Prentice Pagan, MD;  Location: [facility];  Service: Orthopedics;  Laterality: Right;   ESOPHAGOGASTRODUODENOSCOPY (EGD) WITH PROPOFOL  N/A 09/29/2019   Procedure: ESOPHAGOGASTRODUODENOSCOPY (EGD) WITH PROPOFOL ;  Surgeon: Saintclair Jasper, MD;  [facility];  Service: Gastroenterology;  Laterality: N/A;   EYE SURGERY  05/25/1993   rk (laser surgery), semi cornea transplant, detacted retina,  fluid removal   HYSTEROSCOPY WITH D & C N/A 12/09/2020   Procedure: DILATATION AND CURETTAGE /HYSTEROSCOPY;  Surgeon: Storm Setter, DO;  Location:  SURGERY CENTER;  Service: Gynecology;  Laterality: N/A;   KNEE ARTHROSCOPY Left 03/03/2004   POLYPECTOMY  09/29/2019   Procedure: POLYPECTOMY;  Surgeon: Saintclair Jasper, MD;  [facility];  Service: Gastroenterology;;   POSTERIOR VITRECTOMY RIGHT EYE AND LASER   10/27/1999   right foot surgery      SHOULDER SURGERY Right 05/26/1995   SKIN BIOPSY  05/2022   lower stomach   SPINAL FIXATION SURGERY W/ IMPLANT  2013 rod #  1//  2014  rod 2   S1 -- T10  (rod #1)//   S1 -- T4 (rod #2)   THORACIC FUSION  03/13/2013   REMOVAL HARDWARE/  BONE GRAFT FUSION T10//  REVISION OF RODS   TOTAL KNEE ARTHROPLASTY Left 12/14/2005   TOTAL KNEE ARTHROPLASTY Right 08/20/2023   Procedure: ARTHROPLASTY, KNEE, TOTAL;  Surgeon: Kay Kemps, MD;  Location: [facility];  Service: Orthopedics;  Laterality: Right;   UVULOPALATOPHARYNGOPLASTY  04/26/2006   w/  TONSILLECTOMY/  TURBINATE REDUCTIONS/  BILATERAL ANTERIOR ETHMOIDECTOMY   Current Outpatient Medications on File Prior to  Visit  Medication Sig Dispense Refill   amLODipine  (NORVASC ) 5 MG tablet TAKE 1 TABLET (5 MG TOTAL) BY MOUTH DAILY. 90 tablet 1   Budeson-Glycopyrrol-Formoterol  (BREZTRI  AEROSPHERE) 160-9-4.8 MCG/ACT AERO Inhale 2 puffs into the lungs in the morning and at bedtime. 3 each 3   buPROPion  (WELLBUTRIN  XL) 300 MG 24 hr tablet Take 300 mg by mouth every morning.  1   clonazePAM  (KLONOPIN ) 1 MG tablet TAKE 1 TABLET BY MOUTH EVERY DAY AS NEEDED FOR ANXIETY 30 tablet 1   clotrimazole  (LOTRIMIN ) 1 % cream APPLY TO AFFECTED AREA TWICE A DAY 30 g 0   cyclobenzaprine  (FLEXERIL ) 10 MG tablet TAKE 1 TABLET BY MOUTH THREE TIMES A DAY AS NEEDED FOR MUSCLE SPASMS 30 tablet 0   dexlansoprazole  (DEXILANT ) 60 MG capsule TAKE 1 CAPSULE BY MOUTH EVERY DAY 90 capsule 0   doxazosin  (CARDURA ) 4 MG tablet TAKE 1 TABLET BY MOUTH EVERY DAY 90 tablet 1   doxepin  (SINEQUAN ) 50 MG capsule Take 100 mg by mouth at bedtime.     Evolocumab  (REPATHA  SURECLICK) 140 MG/ML SOAJ Inject 140 mg into the skin every 14 (fourteen) days. 2 mL 11   ezetimibe  (ZETIA ) 10 MG tablet TAKE 1 TABLET BY MOUTH EVERY DAY 90 tablet 2   Ferrous Sulfate (IRON PO) Take by mouth.     FLUoxetine  (PROZAC ) 20 MG capsule Take 60 mg by mouth at bedtime.     fluticasone  (FLONASE ) 50 MCG/ACT nasal spray Place 2 sprays into both nostrils daily as needed for allergies. 16 g 11   INGREZZA  80 MG capsule Take one capsule by mouth daily (Patient taking differently: Take 80 mg by mouth daily.) 30 capsule 1   leflunomide  (ARAVA ) 20 MG tablet TAKE 1 TABLET BY MOUTH EVERY DAY 90 tablet 0   LINZESS  72 MCG capsule TAKE 1 CAPSULE BY MOUTH DAILY BEFORE BREAKFAST. (Patient taking differently: Take 72 mcg by mouth daily as needed (constipation).) 90 capsule 3   methocarbamol  (ROBAXIN ) 500 MG tablet Take 1 tablet (500 mg total) by mouth every 8 (eight) hours as needed for muscle spasms. 40 tablet 1   metoprolol  succinate (TOPROL -XL) 50 MG 24 hr tablet TAKE 1 TABLET BY MOUTH EVERY  DAY WITH A MEAL 90 tablet 0   montelukast  (SINGULAIR ) 10 MG tablet Take 1 tablet (10 mg total) by mouth at bedtime. 30 tablet 11   ondansetron  (ZOFRAN ) 4 MG tablet Take 1 tablet (4 mg total) by mouth 4 (four) times daily as needed for nausea. 90 tablet 1   ondansetron  (ZOFRAN ) 4 MG tablet Take 1 tablet (4 mg total) by mouth every 8 (eight) hours as needed. 30 tablet 1   oxyCODONE -acetaminophen  (PERCOCET) 5-325 MG tablet Take 1 tablet by mouth every 8 (eight) hours as needed for severe pain (pain score 7-10). Ok to fill after 01/01/23 60 tablet 0   oxyCODONE -acetaminophen  (PERCOCET) 5-325 MG tablet Take 1  tablet by mouth every 8 (eight) hours as needed for severe pain (pain score 7-10). Ok to fill after 01/01/23 60 tablet 0   spironolactone  (ALDACTONE ) 25 MG tablet Take 25 mg by mouth daily. (Patient taking differently: Take 25 mg by mouth as needed.)     tirzepatide  5 MG/0.5ML injection vial Inject 5 mg into the skin once a week. 2 mL 3   valsartan  (DIOVAN ) 320 MG tablet Take 1 tablet (320 mg total) by mouth daily. 90 tablet 3   White Petrolatum-Mineral Oil (REFRESH P.M. OP) Place 1 drop into both eyes every 6 (six) hours as needed (dry eyes).     No current facility-administered medications on file prior to visit.   Allergies  Allergen Reactions   Latex Rash    Mouth sores   Amlodipine  Besy-Benazepril Hcl Cough    No angioedema   Amoxicillin-Pot Clavulanate Diarrhea and Nausea And Vomiting   Bextra [Valdecoxib] Diarrhea and Nausea And Vomiting    Acid reflux   Hydrochlorothiazide      Low sodium   Lipitor [Atorvastatin Calcium] Other (See Comments)    MYALGIAS   Spironolactone      Hyponatremia   Sulfa Antibiotics Nausea And Vomiting    Cramping and stomach hurts   Zocor [Simvastatin] Other (See Comments)    MYALGIA   Chlorhexidine  Rash    Rash all over body. Should not take topical or mouthwash   Social History   Socioeconomic History   Marital status: Married    Spouse name: Daryl    Number of children: Not on file   Years of education: Not on file   Highest education level: Bachelor's degree (e.g., BA, AB, BS)  Occupational History    Comment: retired  Tobacco Use   Smoking status: Former    Current packs/day: 0.00    Average packs/day: 0.5 packs/day for 10.0 years (5.0 ttl pk-yrs)    Types: Cigarettes    Start date: 03/06/1985    Quit date: 03/07/1995    Years since quitting: 28.8    Passive exposure: Never   Smokeless tobacco: Never  Vaping Use   Vaping status: Never Used  Substance and Sexual Activity   Alcohol use: Yes    Comment: rare   Drug use: Never   Sexual activity: Not Currently  Other Topics Concern   Not on file  Social History Narrative   Lives with husband   Social Drivers of Health   Financial Resource Strain: Low Risk  (08/19/2023)   Overall Financial Resource Strain (CARDIA)    Difficulty of Paying Living Expenses: Not hard at all  Food Insecurity: No Food Insecurity (08/20/2023)   Hunger Vital Sign    Worried About Running Out of Food in the Last Year: Never true    Ran Out of Food in the Last Year: Never true  Transportation Needs: No Transportation Needs (08/20/2023)   PRAPARE - Administrator, Civil Service (Medical): No    Lack of Transportation (Non-Medical): No  Physical Activity: Inactive (08/19/2023)   Exercise Vital Sign    Days of Exercise per Week: 0 days    Minutes of Exercise per Session: 0 min  Stress: Stress Concern Present (08/19/2023)   [employer] of Occupational Health - Occupational Stress Questionnaire    Feeling of Stress : To some extent  Social Connections: Socially Integrated (08/20/2023)   Social Connection and Isolation Panel    Frequency of Communication with Friends and Family: Twice a week    Frequency  of Social Gatherings with Friends and Family: Once a week    Attends Religious Services: More than 4 times per year    Active Member of [employer] or Organizations: No    Attends Museum/gallery exhibitions officer: More than 4 times per year    Marital Status: Married  Catering manager Violence: Not At Risk (08/20/2023)   Humiliation, Afraid, Rape, and Kick questionnaire    Fear of Current or Ex-Partner: No    Emotionally Abused: No    Physically Abused: No    Sexually Abused: No    Review of Systems  All other systems reviewed and are negative.      Objective:   Physical Exam Vitals reviewed.  Constitutional:      Appearance: Normal appearance.  Cardiovascular:     Rate and Rhythm: Normal rate and regular rhythm.     Heart sounds: Normal heart sounds.  Pulmonary:     Effort: Pulmonary effort is normal.     Breath sounds: Normal breath sounds.  Skin:    Findings: Rash is not crusting, papular, pustular or vesicular.  Neurological:     Mental Status: She is alert.           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  I explained to her that the only way to manage her sodium would be to restrict her fluids to less than 40 ounces a day or avoid diuretics.  The swelling in her legs is not bad today.  We discussed stopping amlodipine  and try to replace it again with doxazosin  but she does not want to do that.  I explained to her that she can use a diuretic as needed sparingly with very little impact on her sodium.  However if she takes a diuretic regularly, we would have to monitor her sodium closely for hyponatremia

## 2023-12-24 ENCOUNTER — Other Ambulatory Visit (HOSPITAL_COMMUNITY): Payer: Self-pay

## 2023-12-29 ENCOUNTER — Other Ambulatory Visit: Payer: Self-pay | Admitting: Family Medicine

## 2023-12-30 ENCOUNTER — Other Ambulatory Visit: Payer: Self-pay

## 2023-12-30 ENCOUNTER — Telehealth: Payer: Self-pay

## 2023-12-30 DIAGNOSIS — R739 Hyperglycemia, unspecified: Secondary | ICD-10-CM

## 2023-12-30 DIAGNOSIS — E7800 Pure hypercholesterolemia, unspecified: Secondary | ICD-10-CM

## 2023-12-30 DIAGNOSIS — Z6835 Body mass index (BMI) 35.0-35.9, adult: Secondary | ICD-10-CM

## 2023-12-30 DIAGNOSIS — I10 Essential (primary) hypertension: Secondary | ICD-10-CM

## 2023-12-30 MED ORDER — ZEPBOUND 7.5 MG/0.5ML ~~LOC~~ SOLN
7.5000 mg | SUBCUTANEOUS | 5 refills | Status: DC
Start: 2023-12-30 — End: 2024-01-03

## 2023-12-30 NOTE — Telephone Encounter (Signed)
 Copied from CRM [id number]. Topic: Clinical - Prescription Issue >> Dec 30, 2023 12:02 PM Avram MATSU wrote: Reason for CRM: tirzepatide  (ZEPBOUND ) 7.5 MG/0.5ML injection vial [[id number]] this [facility] but I have Eric from [facility] pharmacy stating the hey have not received the rx and it should be sent to them.   [facility] [phone] [place] [address] Phone:[phone] Fax:[phone]

## 2023-12-30 NOTE — Telephone Encounter (Signed)
 FYI Only or Action Required?: Action required by provider: clinical question for provider.  Patient was last seen in primary care on 12/23/2023 by Duanne Butler DASEN, MD.   Copied from CRM [id number]. Topic: Clinical - Prescription Issue >> Dec 30, 2023 12:02 PM Avram MATSU wrote: Reason for CRM: tirzepatide  (ZEPBOUND ) 7.5 MG/0.5ML injection vial [[id number]] this [facility] but I [facility] Eric from [facility] pharmacy stating the hey have not received the rx and it should be sent to them.   [facility] [phone] [place] [address] Phone:[phone] Fax:[phone]

## 2024-01-03 ENCOUNTER — Telehealth: Payer: Self-pay

## 2024-01-03 ENCOUNTER — Other Ambulatory Visit: Payer: Self-pay

## 2024-01-03 DIAGNOSIS — R739 Hyperglycemia, unspecified: Secondary | ICD-10-CM

## 2024-01-03 DIAGNOSIS — I10 Essential (primary) hypertension: Secondary | ICD-10-CM

## 2024-01-03 DIAGNOSIS — Z6835 Body mass index (BMI) 35.0-35.9, adult: Secondary | ICD-10-CM

## 2024-01-03 DIAGNOSIS — E7800 Pure hypercholesterolemia, unspecified: Secondary | ICD-10-CM

## 2024-01-03 MED ORDER — ZEPBOUND 7.5 MG/0.5ML ~~LOC~~ SOLN
7.5000 mg | SUBCUTANEOUS | 5 refills | Status: DC
Start: 2024-01-03 — End: 2024-01-04

## 2024-01-03 NOTE — Telephone Encounter (Signed)
 Copied from CRM [id number]. Topic: Clinical - Prescription Issue >> Jan 03, 2024 12:21 PM Deleta RAMAN wrote: Reason for CRM: patient would like for her zepbound  dosage to be increased to 7 and would like for pcp nurse to give a call regarding concerns. She would also like for her oxycodone  to be 60 pills instead of 40

## 2024-01-04 ENCOUNTER — Other Ambulatory Visit: Payer: Self-pay | Admitting: Family Medicine

## 2024-01-04 DIAGNOSIS — E7800 Pure hypercholesterolemia, unspecified: Secondary | ICD-10-CM

## 2024-01-04 DIAGNOSIS — Z6835 Body mass index (BMI) 35.0-35.9, adult: Secondary | ICD-10-CM

## 2024-01-04 DIAGNOSIS — I10 Essential (primary) hypertension: Secondary | ICD-10-CM

## 2024-01-04 DIAGNOSIS — R739 Hyperglycemia, unspecified: Secondary | ICD-10-CM

## 2024-01-04 MED ORDER — ZEPBOUND 7.5 MG/0.5ML ~~LOC~~ SOLN
7.5000 mg | SUBCUTANEOUS | 5 refills | Status: DC
Start: 2024-01-04 — End: 2024-01-18

## 2024-01-04 MED ORDER — OXYCODONE-ACETAMINOPHEN 5-325 MG PO TABS: 1.0000 | ORAL_TABLET | Freq: Three times a day (TID) | ORAL | 0 refills | Status: DC | PRN

## 2024-01-11 DIAGNOSIS — M19012 Primary osteoarthritis, left shoulder: Secondary | ICD-10-CM | POA: Insufficient documentation

## 2024-01-12 ENCOUNTER — Other Ambulatory Visit (HOSPITAL_COMMUNITY): Payer: Self-pay

## 2024-01-12 ENCOUNTER — Telehealth: Payer: Self-pay | Admitting: Pharmacy Technician

## 2024-01-12 ENCOUNTER — Ambulatory Visit: Admitting: Physician Assistant

## 2024-01-12 NOTE — Telephone Encounter (Signed)
 Pharmacy Patient Advocate Encounter  Received notification from [employer] that Prior Authorization for Zepbound  7.5MG /0.5ML solution  has been DENIED.  Full denial letter will be uploaded to the media tab. See denial reason below.     PA #/Case ID/Reference #: [id number]

## 2024-01-12 NOTE — Telephone Encounter (Signed)
 ERROR/DUPLICATE

## 2024-01-12 NOTE — Telephone Encounter (Signed)
 Pharmacy Patient Advocate Encounter   Received notification from Onbase that prior authorization for Zepbound  7.5MG /0.5ML solution  is required/requested.   Insurance verification completed.   The patient is insured through [facility] .   Per test claim: PA required; PA started via CoverMyMeds. KEY [id number] . Waiting for clinical questions to populate.

## 2024-01-18 ENCOUNTER — Telehealth: Payer: Self-pay

## 2024-01-18 ENCOUNTER — Other Ambulatory Visit: Payer: Self-pay

## 2024-01-18 DIAGNOSIS — I10 Essential (primary) hypertension: Secondary | ICD-10-CM

## 2024-01-18 DIAGNOSIS — R0609 Other forms of dyspnea: Secondary | ICD-10-CM

## 2024-01-18 DIAGNOSIS — E7800 Pure hypercholesterolemia, unspecified: Secondary | ICD-10-CM

## 2024-01-18 DIAGNOSIS — Z6835 Body mass index (BMI) 35.0-35.9, adult: Secondary | ICD-10-CM

## 2024-01-18 DIAGNOSIS — R739 Hyperglycemia, unspecified: Secondary | ICD-10-CM

## 2024-01-18 MED ORDER — TIRZEPATIDE-WEIGHT MANAGEMENT 10 MG/0.5ML ~~LOC~~ SOAJ: 10.0000 mg | SUBCUTANEOUS | 0 refills | Status: DC

## 2024-01-18 NOTE — Telephone Encounter (Signed)
 Next dose sent to [facility] as ordered, Zepbound  10 mg. Pt advised. Mjp,lpn   Copied from CRM #[id number]. Topic: Clinical - Medication Question >> Jan 18, 2024 11:13 AM Tobias CROME wrote: Reason for CRM: Patient requesting for increase in dosage for zepbound .   Please assist patient further.

## 2024-01-20 DIAGNOSIS — M791 Myalgia, unspecified site: Secondary | ICD-10-CM | POA: Insufficient documentation

## 2024-01-28 ENCOUNTER — Other Ambulatory Visit: Payer: Self-pay

## 2024-01-28 DIAGNOSIS — E7800 Pure hypercholesterolemia, unspecified: Secondary | ICD-10-CM

## 2024-01-28 DIAGNOSIS — R0602 Shortness of breath: Secondary | ICD-10-CM

## 2024-01-28 DIAGNOSIS — Z6835 Body mass index (BMI) 35.0-35.9, adult: Secondary | ICD-10-CM

## 2024-01-28 DIAGNOSIS — R0609 Other forms of dyspnea: Secondary | ICD-10-CM

## 2024-01-28 DIAGNOSIS — R739 Hyperglycemia, unspecified: Secondary | ICD-10-CM

## 2024-01-28 MED ORDER — TIRZEPATIDE-WEIGHT MANAGEMENT 10 MG/0.5ML ~~LOC~~ SOAJ: 10.0000 mg | SUBCUTANEOUS | 0 refills | Status: DC

## 2024-02-07 ENCOUNTER — Other Ambulatory Visit: Payer: Self-pay | Admitting: Family Medicine

## 2024-02-07 NOTE — Telephone Encounter (Unsigned)
 Copied from CRM [id number]. Topic: Clinical - Medication Refill >> Feb 07, 2024  1:18 PM Precious C wrote: Medication: oxyCODONE -acetaminophen  (PERCOCET) 5-325 MG tablet  Has the patient contacted their pharmacy? No (Agent: If no, request that the patient contact the pharmacy for the refill. If patient does not wish to contact the pharmacy document the reason why and proceed with request.) (Agent: If yes, when and what did the pharmacy advise?)  This is the patient's preferred pharmacy:  [id number] [place], [place] [facility] [address] [place] [address] Phone: [phone] Fax: [phone]    Is this the correct pharmacy for this prescription? Yes If no, delete pharmacy and type the correct one.   Has the prescription been filled recently? Yes  Is the patient out of the medication? Yes  Has the patient been seen for an appointment in the last year OR does the patient have an upcoming appointment? Yes  Can we respond through MyChart? Yes  Agent: Please be advised that Rx refills may take up to 3 business days. We ask that you follow-up with your pharmacy.

## 2024-02-08 NOTE — Telephone Encounter (Signed)
 Requested medications are due for refill today.  yes  Requested medications are on the active medications list.  yes  Last refill. 01/04/2024 #60 0 rf  Future visit scheduled.   Yes - next year  Notes to clinic.  Refill not delegated.    Requested Prescriptions  Pending Prescriptions Disp Refills   oxyCODONE -acetaminophen  (PERCOCET) 5-325 MG tablet 60 tablet 0    Sig: Take 1 tablet by mouth every 8 (eight) hours as needed for severe pain (pain score 7-10). Ok to fill after 01/01/23     Not Delegated - Analgesics:  Opioid Agonist Combinations Failed - 02/08/2024  5:51 PM      Failed - This refill cannot be delegated      Failed - Urine Drug Screen completed in last 360 days      Passed - Valid encounter within last 3 months    Recent Outpatient Visits           1 month ago Seasonal allergies   [facility] Duanne Butler DASEN, MD   2 months ago Psoriatic arthritis [facility])   [place] Duanne Butler DASEN, MD   2 months ago High blood sugar   [place] Duanne Butler DASEN, MD   4 months ago Cellulitis of ankle   [place] [facility] Aletha Bene, MD   6 months ago Iron deficiency anemia, unspecified iron deficiency anemia type   [place] [facility] Pickard, Butler DASEN, MD       Future Appointments             In 4 weeks Cheryl Waddell HERO, PA-C [facility] - A Dept Of [facility]. [facility]

## 2024-02-09 ENCOUNTER — Other Ambulatory Visit: Payer: Self-pay | Admitting: Family Medicine

## 2024-02-09 NOTE — Telephone Encounter (Unsigned)
 Copied from CRM [id number]. Topic: Clinical - Medication Refill >> Feb 09, 2024  4:19 PM Delon DASEN wrote: Medication: oxyCODONE -acetaminophen  (PERCOCET) 5-325 MG tablet  Has the patient contacted their pharmacy? No (Agent: If no, request that the patient contact the pharmacy for the refill. If patient does not wish to contact the pharmacy document the reason why and proceed with request.) (Agent: If yes, when and what did the pharmacy advise?)  This is the patient's preferred pharmacy:  [id number] [place], [place] [facility] [address] [place] [address] Phone: [phone] Fax: [phone]   Is this the correct pharmacy for this prescription? Yes If no, delete pharmacy and type the correct one.   Has the prescription been filled recently? Yes  Is the patient out of the medication? No  Has the patient been seen for an appointment in the last year OR does the patient have an upcoming appointment? Yes  Can we respond through MyChart? Yes  Agent: Please be advised that Rx refills may take up to 3 business days. We ask that you follow-up with your pharmacy.

## 2024-02-10 ENCOUNTER — Ambulatory Visit: Payer: Self-pay

## 2024-02-10 MED ORDER — OXYCODONE-ACETAMINOPHEN 5-325 MG PO TABS: 1.0000 | ORAL_TABLET | Freq: Three times a day (TID) | ORAL | 0 refills | Status: DC | PRN

## 2024-02-10 NOTE — Telephone Encounter (Signed)
 FYI Only or Action Required?: FYI only for provider.  Patient was last seen in primary care on 12/23/2023 by Duanne Butler DASEN, MD.  Called Nurse Triage reporting Medication Problem.   Triage Disposition: Information or Advice Only Call    Patient/caregiver understands and will follow disposition?: Yes Reason for Disposition  Caller has medicine question only, adult not sick, AND triager answers question  Answer Assessment - Initial Assessment Questions This RN spoke with the patient regarding her oxycodone  prescription. She states she received a voicemail from the office to return a call to nurse triage. Per medication order history, her medication was received by the pharmacy this morning. This information relayed to patient and she denied having any other question at time of call.    1. NAME of MEDICINE: What medicine(s) are you calling about?     Oxycodone   2. QUESTION: What is your question? (e.g., double dose of medicine, side effect)     Patient requesting if medication was sent to the pharmacy  3. PRESCRIBER: Who prescribed the medicine? Reason: if prescribed by specialist, call should be referred to that group.     PCP  4. SYMPTOMS: Do you have any symptoms? If Yes, ask: What symptoms are you having?  How bad are the symptoms (e.g., mild, moderate, severe)     Denies symptoms.  Protocols used: Medication Question Call-A-AH

## 2024-02-10 NOTE — Telephone Encounter (Signed)
 Requested medication (s) are due for refill today: yes  Requested medication (s) are on the active medication list: yes  Last refill:  08/20/23  Future visit scheduled: no   Notes to clinic:  Med not delegated to refuse/Prescriber not at this location/ Called pt and LM on VM to call back to triage nausea   Requested Prescriptions  Pending Prescriptions Disp Refills   ondansetron  (ZOFRAN ) 4 MG tablet 30 tablet 1    Sig: Take 1 tablet (4 mg total) by mouth every 8 (eight) hours as needed.     Not Delegated - Gastroenterology: Antiemetics - ondansetron  Failed - 02/10/2024  3:17 PM      Failed - This refill cannot be delegated      Passed - AST in normal range and within 360 days    AST  Date Value Ref Range Status  12/07/2023 15 10 - 35 U/L Final         Passed - ALT in normal range and within 360 days    ALT  Date Value Ref Range Status  12/07/2023 8 6 - 29 U/L Final         Passed - Valid encounter within last 6 months    Recent Outpatient Visits           1 month ago Seasonal allergies   [place] [facility] Duanne Butler DASEN, MD   2 months ago Psoriatic arthritis [facility])   [place] [facility] Duanne Butler DASEN, MD   3 months ago High blood sugar   [place] [facility] Duanne Butler DASEN, MD   4 months ago Cellulitis of ankle   [place] [facility] Aletha Bene, MD   6 months ago Iron deficiency anemia, unspecified iron deficiency anemia type   [facility] Pickard, Butler DASEN, MD       Future Appointments             In 3 weeks Cheryl Waddell HERO, PA-C [facility] - A Dept Of [facility]. [facility]

## 2024-02-11 MED ORDER — ONDANSETRON HCL 4 MG PO TABS: 4.0000 mg | ORAL_TABLET | Freq: Three times a day (TID) | ORAL | 1 refills | Status: DC | PRN

## 2024-02-14 ENCOUNTER — Other Ambulatory Visit: Payer: Self-pay | Admitting: Family Medicine

## 2024-02-14 MED ORDER — ZEPBOUND 10 MG/0.5ML ~~LOC~~ SOLN: 10.0000 mg | SUBCUTANEOUS | 5 refills | Status: DC

## 2024-02-21 ENCOUNTER — Other Ambulatory Visit: Payer: Self-pay | Admitting: Family Medicine

## 2024-02-22 ENCOUNTER — Other Ambulatory Visit: Payer: Self-pay | Admitting: Family Medicine

## 2024-02-23 ENCOUNTER — Telehealth: Payer: Self-pay

## 2024-02-23 NOTE — Progress Notes (Deleted)
 Office Visit Note  Patient: Janet Peterson             Date of Birth: 1951/04/26           MRN: [id number]             PCP: Duanne Butler DASEN, MD Referring: Duanne Butler DASEN, MD Visit Date: 03/08/2024 Occupation: Data Unavailable  Subjective:  No chief complaint on file.   History of Present Illness: ANGELINE TRICK is a 73 y.o. female ***     Activities of Daily Living:  Patient reports morning stiffness for *** {minute/hour:19697}.   Patient {ACTIONS;DENIES/REPORTS:21021675::Denies} nocturnal pain.  Difficulty dressing/grooming: {ACTIONS;DENIES/REPORTS:21021675::Denies} Difficulty climbing stairs: {ACTIONS;DENIES/REPORTS:21021675::Denies} Difficulty getting out of chair: {ACTIONS;DENIES/REPORTS:21021675::Denies} Difficulty using hands for taps, buttons, cutlery, and/or writing: {ACTIONS;DENIES/REPORTS:21021675::Denies}  No Rheumatology ROS completed.   PMFS History:  Patient Active Problem List   Diagnosis Date Noted   Chronic rhinitis 12/12/2023   Other specified disorders of eustachian tube, bilateral 12/12/2023   Abnormal findings on diagnostic imaging of other specified body structures 09/28/2023   Stiffness of right knee 09/07/2023   Status post total knee replacement, right 08/20/2023   Muscle weakness (generalized) 06/28/2023   Arthritis of right knee 06/03/2023   Tardive dyskinesia 05/31/2023   Acute cystitis without hematuria 04/07/2023   Pain in joint of right knee 04/01/2023   Sensorineural hearing loss, 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 difficile colitis  09/29/2019   Colitis 09/28/2019   Anxiety and depression 09/28/2019   DDD (degenerative disc disease), cervical s/p fusion 11/12/2016   H/O total knee replacement, left 05/06/2016   DDD lumbar spine status post fusion 05/06/2016   Psoriasis 05/05/2016   High risk medication use 05/05/2016   Cervical post-laminectomy syndrome 12/09/2015   Thoracic postlaminectomy syndrome 12/09/2015   Psoriatic arthritis (HCC) 08/23/2012   Osteopenia 08/23/2012   HLD (hyperlipidemia) 08/23/2012   RLS (restless legs syndrome) 08/23/2012   Insomnia 08/23/2012   Fibromyalgia syndrome 08/12/2012   Low back pain 08/12/2012   Syncope    PVC (premature ventricular contraction)    OCD (obsessive compulsive disorder)    GERD (gastroesophageal reflux disease)    Hypertension     Past Medical History:  Diagnosis Date   Anemia    Ankylosing spondylitis (HCC)    Anxiety and depression    Cancer (HCC)    basal cell skin cancer   Depression    Dyspnea    Fibromyalgia    GERD (gastroesophageal reflux disease)    Hiatal hernia    per patient, dx by GI    History of basal cell carcinoma excision    FACE, 1992  &  1996   History of hiatal hernia    History of thrush    Hyperlipidemia    Hypertension    Insomnia    Left breast mass    OCD (obsessive compulsive disorder)    Osteopenia    PONV (postoperative nausea and vomiting)    Psoriatic arthritis (HCC)    PVC (premature ventricular contraction)    Rheumatoid arthritis (HCC)     Family History  Problem Relation Age of Onset   Diabetes Mother    Heart disease Mother    Diabetes Father    Anuerysm Father    Diabetes Brother    Heart disease Brother    Diabetes Brother    Heart disease Brother    Breast cancer Neg Hx    Past Surgical History:  Procedure Laterality Date   BIOPSY  09/29/2019   Procedure: BIOPSY;  Surgeon: Saintclair Jasper, MD;  Location: [facility];  Service: Gastroenterology;;   BREAST EXCISIONAL BIOPSY Right    BREAST  EXCISIONAL BIOPSY Left 05/04/2018   BREAST LUMPECTOMY WITH RADIOACTIVE SEED LOCALIZATION Left 05/04/2018   Procedure: LEFT BREAST LUMPECTOMY WITH RADIOACTIVE SEED LOCALIZATION AND LEFT BREAST NIPPLE BIOPSY;  Surgeon: Curvin Deward MOULD, MD;  Location: [facility];  Service: General;  Laterality: Left;   BREAST SURGERY  05/25/1973   lumpectomy-- benign   BUNIONECTOMY  05/25/1989   CATARACT EXTRACTION W/ INTRAOCULAR LENS  IMPLANT, BILATERAL  05/25/1998   CERVICAL FUSION  07/24/2011   C5 -- C7   CESAREAN SECTION  05/26/1983   COLONOSCOPY WITH PROPOFOL  N/A 09/29/2019   Procedure: COLONOSCOPY WITH PROPOFOL ;  Surgeon: Saintclair Jasper, MD;  Location: [facility];  Service: Gastroenterology;  Laterality: N/A;   DISTAL INTERPHALANGEAL JOINT FUSION Right 03/14/2015   Procedure: RIGHT LONG FINGER DISTAL INTERPHALANGEAL JOINT ARTHRODESIS;  Surgeon: Prentice Pagan, MD;  Location: [facility] ;  Service: Orthopedics;  Laterality: Right;   ESOPHAGOGASTRODUODENOSCOPY (EGD) WITH PROPOFOL  N/A 09/29/2019   Procedure: ESOPHAGOGASTRODUODENOSCOPY (EGD) WITH PROPOFOL ;  Surgeon: Saintclair Jasper, MD;  Location: [facility];  Service: Gastroenterology;  Laterality: N/A;   EYE SURGERY  05/25/1993   rk (laser surgery), semi cornea transplant, detacted retina,  fluid removal   HYSTEROSCOPY WITH D & C N/A 12/09/2020   Procedure: DILATATION AND CURETTAGE /HYSTEROSCOPY;  Surgeon: Storm Setter, DO;  Location: [facility];  Service: Gynecology;  Laterality: N/A;   KNEE ARTHROSCOPY Left 03/03/2004   POLYPECTOMY  09/29/2019   Procedure: POLYPECTOMY;  Surgeon: Saintclair Jasper, MD;  Location: [facility];  Service: Gastroenterology;;   POSTERIOR VITRECTOMY RIGHT EYE AND LASER   10/27/1999   right foot surgery      SHOULDER SURGERY Right 05/26/1995   SKIN BIOPSY  05/2022   lower stomach   SPINAL FIXATION SURGERY W/ IMPLANT  2013 rod #1//  2014  rod 2   S1 -- T10  (rod #1)//   S1 -- T4 (rod #2)    THORACIC FUSION  03/13/2013   REMOVAL HARDWARE/  BONE GRAFT FUSION T10//  REVISION OF RODS   TOTAL KNEE ARTHROPLASTY Left 12/14/2005   TOTAL KNEE ARTHROPLASTY Right 08/20/2023   Procedure: ARTHROPLASTY, KNEE, TOTAL;  Surgeon: Kay Kemps, MD;  Location: [facility];  Service: Orthopedics;  Laterality: Right;   UVULOPALATOPHARYNGOPLASTY  04/26/2006   w/  TONSILLECTOMY/  TURBINATE REDUCTIONS/  BILATERAL ANTERIOR ETHMOIDECTOMY   Social History   Tobacco Use   Smoking status: Former    Current packs/day: 0.00    Average packs/day: 0.5 packs/day for 10.0 years (5.0 ttl pk-yrs)    Types: Cigarettes    Start date: 03/06/1985    Quit date: 03/07/1995    Years since quitting: 28.9    Passive exposure: Never   Smokeless tobacco: Never  Vaping Use   Vaping status: Never Used  Substance Use Topics   Alcohol use: Yes    Comment: rare   Drug use: Never   Social History   Social History  Narrative   Lives with husband     Immunization History  Administered Date(s) Administered   Fluad Quad(high Dose 65+) 01/26/2019, 03/05/2020, 03/18/2022   Fluad Trivalent(High Dose 65+) 03/16/2023   INFLUENZA, HIGH DOSE SEASONAL PF 03/25/2018   Influenza Whole 03/04/2012   Influenza,inj,Quad PF,6+ Mos 02/28/2013, 03/07/2014, 02/28/2015, 03/27/2016, 03/09/2017   Influenza-Unspecified 04/08/2021   PFIZER(Purple Top)SARS-COV-2 Vaccination 08/10/2019   PNEUMOCOCCAL CONJUGATE-20 10/27/2022   Pneumococcal Conjugate-13 09/10/2016   Pneumococcal Polysaccharide-23 03/12/2006, 11/09/2011   Pneumococcal-Unspecified 08/31/2016   Unspecified SARS-COV-2 Vaccination 08/07/2019, 08/28/2019, 03/08/2020   Zoster Recombinant(Shingrix) 08/12/2021, 11/21/2021     Objective: Vital Signs: There were no vitals taken for this visit.   Physical Exam   Musculoskeletal Exam: ***  CDAI Exam: CDAI Score: -- Patient Global: --; Provider Global: -- Swollen: --; Tender: -- Joint Exam 03/08/2024   No joint exam has been  documented for this visit   There is currently no information documented on the homunculus. Go to the Rheumatology activity and complete the homunculus joint exam.  Investigation: No additional findings.  Imaging: No results found.  Recent Labs: Lab Results  Component Value Date   WBC 6.1 12/07/2023   HGB 11.7 12/07/2023   PLT 271 12/07/2023   NA 134 (L) 12/07/2023   K 4.9 12/07/2023   CL 100 12/07/2023   CO2 28 12/07/2023   GLUCOSE 98 12/07/2023   BUN 11 12/07/2023   CREATININE 0.80 12/07/2023   BILITOT 0.3 12/07/2023   ALKPHOS 99 05/27/2023   AST 15 12/07/2023   ALT 8 12/07/2023   PROT 6.1 12/07/2023   ALBUMIN  3.6 05/27/2023   CALCIUM 10.1 12/07/2023   GFRAA 82 09/26/2020   QFTBGOLD Indeterminate 08/14/2016   QFTBGOLDPLUS Negative 12/22/2018    Speciality Comments: Simponi  Aria every 8 weeks started Jan 2018  TB gold negative 08/19/16  Procedures:  No procedures performed Allergies: Latex, Amlodipine  besy-benazepril hcl, Amoxicillin-pot clavulanate, Bextra [valdecoxib], Hydrochlorothiazide , Lipitor [atorvastatin calcium], Spironolactone , Sulfa antibiotics, Zocor [simvastatin], and Chlorhexidine    Assessment / Plan:     Visit Diagnoses: Psoriatic arthritis (HCC)  Psoriasis  High risk medication use  Primary osteoarthritis of both hands  S/P TKR (total knee replacement), right  H/O total knee replacement, left  DDD (degenerative disc disease), cervical s/p fusion  DDD (degenerative disc disease), thoracic  DDD lumbar spine status post fusion  Fibromyalgia  Other fatigue  Primary insomnia  Osteopenia of multiple sites  History of gastroesophageal reflux (GERD)  History of hyperlipidemia  History of hypertension  History of OCD (obsessive compulsive disorder)  RLS (restless legs syndrome)  History of migraine  Orders: No orders of the defined types were placed in this encounter.  No orders of the defined types were placed in this  encounter.   Face-to-face time spent with patient was *** minutes. Greater than 50% of time was spent in counseling and coordination of care.  Follow-Up Instructions: No follow-ups on file.   Waddell CHRISTELLA Craze, PA-C  Note - This record has been created using Dragon software.  Chart creation errors have been sought, but may not always  have been located. Such creation errors do not reflect on  the standard of medical care.

## 2024-02-23 NOTE — Telephone Encounter (Signed)
 Requested Prescriptions  Pending Prescriptions Disp Refills   metoprolol  succinate (TOPROL -XL) 50 MG 24 hr tablet [Pharmacy Med Name: METOPROLOL  SUCC ER 50 MG TAB] 90 tablet 0    Sig: TAKE 1 TABLET BY MOUTH EVERY DAY WITH A MEAL     Cardiovascular:  Beta Blockers Passed - 02/23/2024 11:44 AM      Passed - Last BP in normal range    BP Readings from Last 1 Encounters:  12/23/23 126/78         Passed - Last Heart Rate in normal range    Pulse Readings from Last 1 Encounters:  12/23/23 73         Passed - Valid encounter within last 6 months    Recent Outpatient Visits           2 months ago Seasonal allergies   [place] [facility] Duanne Butler DASEN, MD   2 months ago Psoriatic arthritis [name])   [place] [facility] Duanne Butler DASEN, MD   3 months ago High blood sugar   [place] [facility] Duanne Butler DASEN, MD   4 months ago Cellulitis of ankle   [place] [facility] Aletha Bene, MD   7 months ago Iron deficiency anemia, unspecified iron deficiency anemia type   [place] [facility] Pickard, Butler DASEN, MD       Future Appointments             In 2 weeks Cheryl Waddell HERO, PA-C [facility] - [facility] Of [place]

## 2024-02-23 NOTE — Telephone Encounter (Signed)
 Copied from CRM [id number]. Topic: Clinical - Medication Question >> Feb 23, 2024 10:09 AM Carlatta H wrote: Reason for CRM: clonazePAM  (KLONOPIN ) 1 MG tablet [[id number]]//doxepin  (SINEQUAN ) 50 MG capsule [[id number]]//oxyCODONE -acetaminophen  (PERCOCET) 5-325 MG tablet [[id number]]//cyclobenzaprine  (FLEXERIL ) 10 MG tablet [[id number]]// clonazePAM  (KLONOPIN ) 1 MG tablet [[id number]]//Insurance is looking for alternative medications due to the patients age// Please call back [phone]

## 2024-02-24 ENCOUNTER — Ambulatory Visit: Admitting: Pulmonary Disease

## 2024-02-24 ENCOUNTER — Telehealth: Payer: Self-pay

## 2024-02-24 ENCOUNTER — Encounter: Payer: Self-pay | Admitting: Pulmonary Disease

## 2024-02-24 VITALS — BP 112/80 | HR 77 | Temp 98.0°F | Ht 63.0 in | Wt 196.0 lb

## 2024-02-24 DIAGNOSIS — J4530 Mild persistent asthma, uncomplicated: Secondary | ICD-10-CM

## 2024-02-24 NOTE — Progress Notes (Signed)
 Janet Peterson    [id number]    03/20/1951  Primary Care Physician:Pickard, Butler DASEN, MD  Referring Physician: Duanne Butler DASEN, MD [address] [place],  [place] [address]  Chief complaint: Follow-up for dyspnea, asthma  HPI: 73 y.o. who  has a past medical history of Anemia, Ankylosing spondylitis (HCC), Anxiety and depression, Cancer (HCC), Depression, Dyspnea, Fibromyalgia, GERD (gastroesophageal reflux disease), Hiatal hernia, History of basal cell carcinoma excision, History of hiatal hernia, History of thrush, Hyperlipidemia, Hypertension, Insomnia, Left breast mass, OCD (obsessive compulsive disorder), Osteopenia, PONV (postoperative nausea and vomiting), Psoriatic arthritis (HCC), PVC (premature ventricular contraction), and Rheumatoid arthritis (HCC).   Discussed the use of AI scribe software for clinical note transcription with the patient, who gave verbal consent to proceed.  Janet Peterson is a 73 year old female who presents with chronic shortness of breath. She is accompanied by her husband, Darryl. She was referred by a PA for evaluation of her chronic shortness of breath.  She has experienced shortness of breath for several years, which has progressively worsened. She becomes short of breath after walking approximately five to ten yards, such as from her car to her house, and also experiences difficulty breathing after showering, requiring her to rest her head on a ledge. She occasionally wakes up at night feeling breathless, which improves after sitting up and taking deep breaths. No cough, mucus congestion, or wheezing.  She underwent a pulmonary function test in December or January, which indicated low lung function. She was informed that her stomach pushes on her diaphragm, affecting her lungs. A CT scan of her chest in May and a CT of her heart in October were performed, both reported to be normal, although there was mention of atelectasis. She was previously  given an albuterol  inhaler by a PA, which she used sporadically with minimal relief.  She has experienced swelling in her feet and legs, for which she was previously prescribed HCTZ and spironolactone , both of which helped with her breathing. However, her primary care doctor discontinued these medications due to low sodium levels. Her sodium levels have been as low as 127, but were normal in January. She is scheduled for lab work to recheck her sodium levels.  Her past medical history includes scoliosis and significant spinal issues, for which she has had surgery involving three rods and fifty-two screws. She has allergies and previously received allergy shots for twenty years, which she discontinued in 2000. She denies any family history of lung disease.  She has a history of smoking, having quit in 1996 after smoking intermittently for about 15-20 years, with a maximum of six cigarettes a day.   Interim History: Discussed the use of AI scribe software for clinical note transcription with the patient, who gave verbal consent to proceed. History of Present Illness  Janet Peterson is a 73 year old female with mild asthma who presents with shortness of breath on exertion.  Dyspnea on exertion - Shortness of breath occurs during exertion and when getting in and out of the shower - Symptoms limit ability to exercise beyond 20 minutes, even while following an exercise program at the [facility] and riding a recumbent bike - No wheezing present - Shortness of breath persists despite initial improvement with Singulair  10 mg; currently no significant benefit from this medication  Pulmonary imaging and laboratory findings - CT scan earlier this year showed no interstitial lung disease - Blood  tests have not shown elevated peripheral eosinophils  Cardiac evaluation - Heart evaluation revealed no significant issues beyond what is normal for her age  Pulmonary function and diagnosis - Has been told she  has restricted lung disease   Outpatient Encounter Medications as of 02/24/2024  Medication Sig   amLODipine  (NORVASC ) 5 MG tablet TAKE 1 TABLET (5 MG TOTAL) BY MOUTH DAILY.   Budeson-Glycopyrrol-Formoterol  (BREZTRI  AEROSPHERE) 160-9-4.8 MCG/ACT AERO Inhale 2 puffs into the lungs in the morning and at bedtime.   clonazePAM  (KLONOPIN ) 1 MG tablet TAKE 1 TABLET BY MOUTH EVERY DAY AS NEEDED FOR ANXIETY   Evolocumab  (REPATHA  SURECLICK) 140 MG/ML SOAJ Inject 140 mg into the skin every 14 (fourteen) days.   montelukast  (SINGULAIR ) 10 MG tablet Take 1 tablet (10 mg total) by mouth at bedtime.   buPROPion  (WELLBUTRIN  XL) 300 MG 24 hr tablet Take 300 mg by mouth every morning.   clotrimazole  (LOTRIMIN ) 1 % cream APPLY TO AFFECTED AREA TWICE A DAY   cyclobenzaprine  (FLEXERIL ) 10 MG tablet TAKE 1 TABLET BY MOUTH THREE TIMES A DAY AS NEEDED FOR MUSCLE SPASMS   dexlansoprazole  (DEXILANT ) 60 MG capsule Take 1 capsule (60 mg total) by mouth daily.   doxazosin  (CARDURA ) 4 MG tablet TAKE 1 TABLET BY MOUTH EVERY DAY (Patient not taking: Reported on 12/23/2023)   doxepin  (SINEQUAN ) 50 MG capsule Take 100 mg by mouth at bedtime.   ezetimibe  (ZETIA ) 10 MG tablet TAKE 1 TABLET BY MOUTH EVERY DAY   Ferrous Sulfate (IRON PO) Take by mouth.   FLUoxetine  (PROZAC ) 20 MG capsule Take 60 mg by mouth at bedtime.   fluticasone  (FLONASE ) 50 MCG/ACT nasal spray Place 2 sprays into both nostrils daily as needed for allergies.   INGREZZA  80 MG capsule Take one capsule by mouth daily (Patient taking differently: Take 80 mg by mouth daily.)   leflunomide  (ARAVA ) 20 MG tablet TAKE 1 TABLET BY MOUTH EVERY DAY   LINZESS  72 MCG capsule TAKE 1 CAPSULE BY MOUTH DAILY BEFORE BREAKFAST. (Patient taking differently: Take 72 mcg by mouth daily as needed (constipation).)   methocarbamol  (ROBAXIN ) 500 MG tablet Take 1 tablet (500 mg total) by mouth every 8 (eight) hours as needed for muscle spasms.   metoprolol  succinate (TOPROL -XL) 50 MG 24  hr tablet TAKE 1 TABLET BY MOUTH EVERY DAY WITH A MEAL   ondansetron  (ZOFRAN ) 4 MG tablet Take 1 tablet (4 mg total) by mouth 4 (four) times daily as needed for nausea.   ondansetron  (ZOFRAN ) 4 MG tablet Take 1 tablet (4 mg total) by mouth every 8 (eight) hours as needed.   oxyCODONE -acetaminophen  (PERCOCET) 5-325 MG tablet Take 1 tablet by mouth every 8 (eight) hours as needed for severe pain (pain score 7-10). Ok to fill after 01/01/23   oxyCODONE -acetaminophen  (PERCOCET) 5-325 MG tablet Take 1 tablet by mouth every 8 (eight) hours as needed for severe pain (pain score 7-10). Ok to fill after 01/01/23   spironolactone  (ALDACTONE ) 25 MG tablet Take 25 mg by mouth daily. (Patient taking differently: Take 25 mg by mouth as needed.)   tirzepatide  (ZEPBOUND ) 10 MG/0.5ML injection vial Inject 10 mg into the skin once a week.   valsartan  (DIOVAN ) 320 MG tablet Take 1 tablet (320 mg total) by mouth daily.   White Petrolatum-Mineral Oil (REFRESH P.M. OP) Place 1 drop into both eyes every 6 (six) hours as needed (dry eyes).   No facility-administered encounter medications on file as of 02/24/2024.   Vitals:   02/24/24  1350  BP: 112/80  Pulse: 77  Temp: 98 F (36.7 C)  Height: 5' 3 (1.6 m)  Weight: 196 lb (88.9 kg)  SpO2: 97%  TempSrc: Oral  BMI (Calculated): 34.73    Physical Exam GEN: No acute distress. CV: Regular rate and rhythm, no murmurs. LUNGS: Clear to auscultation bilaterally, no wheezing, normal respiratory effort. SKIN JOINTS: Warm and dry, no rash.   Data Reviewed: Imaging: CTA 10/08/2022-no PE, subsegmental atelectasis at the base Cardiac CT 12/16/2022-visualized lungs appear clear except for subsegmental atelectasis High resolution CT 08/13/2023-no evidence of interstitial lung disease, air trapping, tracheobronchomalacia, aortic, coronary atherosclerosis.-Pulmonary trunk I reviewed the images personally  PFTs: 06/11/2023 FVC 1.83 [86%], FEV1 1.29 [62%], F/F71, TLC 3.37 [68%],  DLCO 14.07 [95%] Severe restriction, bronchodilator response  Labs: CBC  07/22/2023-WBC 7.3, absolute eosinophil count 58 12/07/2023-WBC 6.1, absolute eosinophil count 128  IgE 07/20/2023-32 BNP 07/20/2023-57  Cardiac: Echocardiogram 11/16/2022-LVEF 55-60%, mild LVH, normal RV systolic size and function.  TR is not adequate for assessing PA pressure Assessment & Plan Asthma Currently on Breztri  and Singulair  with some improvement but she experiences persistent exertional dyspnea, particularly during activities like showering. No wheezing is present.  She is low peripheral eosinophils and IgE hence not a candidate for biologic therapy.  Cardiac issues have been previously excluded. Differential diagnosis includes deconditioning as a potential cause of dyspnea. Cardiopulmonary exercise testing was discussed as a diagnostic tool, but she opted to defer. 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 No interstitial lung disease noted on CT scan.   Recommendations: Continue Breztri , Singulair  Exercise program Follow-up high-res CT and PFTs in 6 months  Lonna Coder MD [facility] 02/24/2024, 1:51 PM  CC: Duanne Butler DASEN, MD

## 2024-02-24 NOTE — Telephone Encounter (Signed)
 Copied from CRM [id number]. Topic: Clinical - Medication Question >> Feb 24, 2024 12:41 PM Janet Peterson wrote: Reason for CRM: Pt would like to leave a message for Janet Peterson regarding her clonazepam . Pt's insurance is denying he clonazepam  even though she's been on it for 15 years. They are saying that it interferes with her oxycodone ,but pt said she's been on that for 10 years with no problems. Pt would like for her PCP to file an appeal. Pls call pt.

## 2024-02-24 NOTE — Patient Instructions (Signed)
  VISIT SUMMARY: Today, we discussed your ongoing shortness of breath during exertion, particularly when exercising or showering. Your mild asthma is well-controlled with Singulair , but you continue to experience symptoms.  YOUR PLAN: ASTHMA AND RESTRICTIVE LUNG DISEASE WITH CHRONIC SHORTNESS OF BREATH: Your mild asthma is well-controlled, but you have persistent shortness of breath during activities like showering and exercising. Previous tests have ruled out other significant lung and heart issues. -Continue taking Singulair  10 mg daily. -Maintain your current exercise routine with the recumbent bike. -Gradually increase the intensity of your exercise over time. -Consider a cardiopulmonary exercise test if your shortness of breath worsens or significantly impacts your quality of life. -Plan to repeat your CT scan and lung function test in April or May 2026.

## 2024-02-25 ENCOUNTER — Encounter: Payer: Self-pay | Admitting: Family Medicine

## 2024-02-25 ENCOUNTER — Ambulatory Visit (INDEPENDENT_AMBULATORY_CARE_PROVIDER_SITE_OTHER): Admitting: Family Medicine

## 2024-02-25 VITALS — BP 118/64 | HR 76 | Temp 97.6°F | Ht 63.0 in | Wt 195.0 lb

## 2024-02-25 DIAGNOSIS — Z79899 Other long term (current) drug therapy: Secondary | ICD-10-CM | POA: Diagnosis not present

## 2024-02-25 DIAGNOSIS — Z23 Encounter for immunization: Secondary | ICD-10-CM

## 2024-02-25 MED ORDER — ZEPBOUND 10 MG/0.5ML ~~LOC~~ SOLN: 10.0000 mg | SUBCUTANEOUS | 5 refills | Status: DC

## 2024-02-25 NOTE — Addendum Note (Signed)
 Addended by: ANGELENA RONAL BRADLEY K on: 02/25/2024 02:38 PM   Modules accepted: Orders

## 2024-02-25 NOTE — Progress Notes (Signed)
 Subjective:    Patient ID: Janet Peterson, female    DOB: 03-15-1951, 73 y.o.   MRN: [id number]  HPI Patient is here today to discuss her medications.  Recently received communication from her insurance company concerned about polypharmacy.  The patient is currently taking 1 mg of Klonopin  at night to help her sleep and to help with OCD.  Without the medication she is unable to rest.  She feels extremely anxious without the medication.  She states that she has tried to wean off and in the past but she had a very bad experience.  She also takes doxepin .  Both of these medicines are prescribed by her psychiatrist.  Without the medications she states that she can go a day or 2 without sleeping.  She also has chronic low back pain for which she takes oxycodone .  She takes oxycodone  twice a day.  Occasionally she will take it once a day.  She seldom uses Flexeril  but it is on her medicine list. Past Medical History:  Diagnosis Date   Anemia    Ankylosing spondylitis (HCC)    Anxiety and depression    Cancer (HCC)    basal cell skin cancer   Depression    Dyspnea    Fibromyalgia    GERD (gastroesophageal reflux disease)    Hiatal hernia    per patient, dx by GI    History of basal cell carcinoma excision    FACE, 1992  &  1996   History of hiatal hernia    History of thrush    Hyperlipidemia    Hypertension    Insomnia    Left breast mass    OCD (obsessive compulsive disorder)    Osteopenia    PONV (postoperative nausea and vomiting)    Psoriatic arthritis (HCC)    PVC (premature ventricular contraction)    Rheumatoid arthritis (HCC)    Past Surgical History:  Procedure Laterality Date   BIOPSY  09/29/2019   Procedure: BIOPSY;  Surgeon: Saintclair Jasper, MD;  Location: [facility];  Service: Gastroenterology;;   BREAST EXCISIONAL BIOPSY Right    BREAST EXCISIONAL BIOPSY Left 05/04/2018   BREAST LUMPECTOMY WITH RADIOACTIVE SEED LOCALIZATION Left 05/04/2018   Procedure: LEFT BREAST  LUMPECTOMY WITH RADIOACTIVE SEED LOCALIZATION AND LEFT BREAST NIPPLE BIOPSY;  Surgeon: Curvin Deward MOULD, MD;  Location: [facility];  Service: General;  Laterality: Left;   BREAST SURGERY  05/25/1973   lumpectomy-- benign   BUNIONECTOMY  05/25/1989   CATARACT EXTRACTION W/ INTRAOCULAR LENS  IMPLANT, BILATERAL  05/25/1998   CERVICAL FUSION  07/24/2011   C5 -- C7   CESAREAN SECTION  05/26/1983   COLONOSCOPY WITH PROPOFOL  N/A 09/29/2019   Procedure: COLONOSCOPY WITH PROPOFOL ;  Surgeon: Saintclair Jasper, MD;  Location: [facility];  Service: Gastroenterology;  Laterality: N/A;   DISTAL INTERPHALANGEAL JOINT FUSION Right 03/14/2015   Procedure: RIGHT LONG FINGER DISTAL INTERPHALANGEAL JOINT ARTHRODESIS;  Surgeon: Prentice Pagan, MD;  Location: [facility];  Service: Orthopedics;  Laterality: Right;   ESOPHAGOGASTRODUODENOSCOPY (EGD) WITH PROPOFOL  N/A 09/29/2019   Procedure: ESOPHAGOGASTRODUODENOSCOPY (EGD) WITH PROPOFOL ;  Surgeon: Saintclair Jasper, MD;  Location: [facility];  Service: Gastroenterology;  Laterality: N/A;   EYE SURGERY  05/25/1993   rk (laser surgery), semi cornea transplant, detacted retina,  fluid removal   HYSTEROSCOPY WITH D & C N/A 12/09/2020   Procedure: DILATATION AND CURETTAGE /HYSTEROSCOPY;  Surgeon: Storm Setter, DO;  Location: [facility];  Service: Gynecology;  Laterality: N/A;   KNEE ARTHROSCOPY Left 03/03/2004   POLYPECTOMY  09/29/2019   Procedure: POLYPECTOMY;  Surgeon: Saintclair Jasper, MD;  Location: [facility];  Service: Gastroenterology;;   POSTERIOR VITRECTOMY RIGHT EYE AND LASER   10/27/1999   right foot surgery      SHOULDER SURGERY Right 05/26/1995   SKIN BIOPSY  05/2022   lower stomach   SPINAL FIXATION SURGERY W/ IMPLANT  2013 rod #1//  2014  rod 2   S1 -- T10  (rod #1)//   S1 -- T4 (rod #2)   THORACIC FUSION  03/13/2013   REMOVAL HARDWARE/  BONE GRAFT FUSION T10//  REVISION OF RODS   TOTAL KNEE ARTHROPLASTY Left  12/14/2005   TOTAL KNEE ARTHROPLASTY Right 08/20/2023   Procedure: ARTHROPLASTY, KNEE, TOTAL;  Surgeon: Kay Kemps, MD;  Location: [facility];  Service: Orthopedics;  Laterality: Right;   UVULOPALATOPHARYNGOPLASTY  04/26/2006   w/  TONSILLECTOMY/  TURBINATE REDUCTIONS/  BILATERAL ANTERIOR ETHMOIDECTOMY   Current Outpatient Medications on File Prior to Visit  Medication Sig Dispense Refill   amLODipine  (NORVASC ) 5 MG tablet TAKE 1 TABLET (5 MG TOTAL) BY MOUTH DAILY. 90 tablet 1   Budeson-Glycopyrrol-Formoterol  (BREZTRI  AEROSPHERE) 160-9-4.8 MCG/ACT AERO Inhale 2 puffs into the lungs in the morning and at bedtime. 3 each 3   buPROPion  (WELLBUTRIN  XL) 300 MG 24 hr tablet Take 300 mg by mouth every morning.  1   clonazePAM  (KLONOPIN ) 1 MG tablet TAKE 1 TABLET BY MOUTH EVERY DAY AS NEEDED FOR ANXIETY 30 tablet 1   cyclobenzaprine  (FLEXERIL ) 10 MG tablet TAKE 1 TABLET BY MOUTH THREE TIMES A DAY AS NEEDED FOR MUSCLE SPASMS (Patient taking differently: PRN) 30 tablet 0   dexlansoprazole  (DEXILANT ) 60 MG capsule Take 1 capsule (60 mg total) by mouth daily. 90 capsule 3   doxepin  (SINEQUAN ) 50 MG capsule Take 100 mg by mouth at bedtime.     Evolocumab  (REPATHA  SURECLICK) 140 MG/ML SOAJ Inject 140 mg into the skin every 14 (fourteen) days. 2 mL 11   ezetimibe  (ZETIA ) 10 MG tablet TAKE 1 TABLET BY MOUTH EVERY DAY 90 tablet 2   Ferrous Sulfate (IRON PO) Take by mouth.     FLUoxetine  (PROZAC ) 20 MG capsule Take 60 mg by mouth at bedtime.     fluticasone  (FLONASE ) 50 MCG/ACT nasal spray Place 2 sprays into both nostrils daily as needed for allergies. (Patient taking differently: Place 2 sprays into both nostrils daily as needed for allergies. Using everday) 16 g 11   INGREZZA  80 MG capsule Take one capsule by mouth daily (Patient taking differently: Take 80 mg by mouth daily.) 30 capsule 1   leflunomide  (ARAVA ) 20 MG tablet TAKE 1 TABLET BY MOUTH EVERY DAY 90 tablet 0   LINZESS  72 MCG capsule TAKE 1 CAPSULE BY  MOUTH DAILY BEFORE BREAKFAST. 90 capsule 3   methocarbamol  (ROBAXIN ) 500 MG tablet Take 1 tablet (500 mg total) by mouth every 8 (eight) hours as needed for muscle spasms. 40 tablet 1   metoprolol  succinate (TOPROL -XL) 50 MG 24 hr tablet TAKE 1 TABLET BY MOUTH EVERY DAY WITH A MEAL 90 tablet 0   montelukast  (SINGULAIR ) 10 MG tablet Take 1 tablet (10 mg total) by mouth at bedtime. 30 tablet 11   ondansetron  (ZOFRAN ) 4 MG tablet Take 1 tablet (4 mg total) by mouth 4 (four) times daily as needed for nausea. 90 tablet 1   oxyCODONE -acetaminophen  (PERCOCET) 5-325 MG tablet Take 1 tablet by mouth every 8 (eight)  hours as needed for severe pain (pain score 7-10). Ok to fill after 01/01/23 60 tablet 0   spironolactone  (ALDACTONE ) 25 MG tablet Take 25 mg by mouth daily.     valsartan  (DIOVAN ) 320 MG tablet Take 1 tablet (320 mg total) by mouth daily. 90 tablet 3   White Petrolatum-Mineral Oil (REFRESH P.M. OP) Place 1 drop into both eyes every 6 (six) hours as needed (dry eyes).     clotrimazole  (LOTRIMIN ) 1 % cream APPLY TO AFFECTED AREA TWICE A DAY (Patient not taking: Reported on 02/25/2024) 30 g 0   doxazosin  (CARDURA ) 4 MG tablet TAKE 1 TABLET BY MOUTH EVERY DAY (Patient not taking: Reported on 02/25/2024) 90 tablet 1   No current facility-administered medications on file prior to visit.   Allergies  Allergen Reactions   Latex Rash    Mouth sores   Amlodipine  Besy-Benazepril Hcl Cough    No angioedema   Amoxicillin-Pot Clavulanate Diarrhea and Nausea And Vomiting   Bextra [Valdecoxib] Diarrhea and Nausea And Vomiting    Acid reflux   Hydrochlorothiazide      Low sodium   Lipitor [Atorvastatin Calcium] Other (See Comments)    MYALGIAS   Spironolactone      Hyponatremia   Sulfa Antibiotics Nausea And Vomiting    Cramping and stomach hurts   Zocor [Simvastatin] Other (See Comments)    MYALGIA   Chlorhexidine  Rash    Rash all over body. Should not take topical or mouthwash   Social History    Socioeconomic History   Marital status: Married    Spouse name: Daryl   Number of children: Not on file   Years of education: Not on file   Highest education level: Bachelor's degree (e.g., BA, AB, BS)  Occupational History    Comment: retired  Tobacco Use   Smoking status: Former    Current packs/day: 0.00    Average packs/day: 0.5 packs/day for 10.0 years (5.0 ttl pk-yrs)    Types: Cigarettes    Start date: 03/06/1985    Quit date: 03/07/1995    Years since quitting: 28.9    Passive exposure: Never   Smokeless tobacco: Never  Vaping Use   Vaping status: Never Used  Substance and Sexual Activity   Alcohol use: Yes    Comment: rare   Drug use: Never   Sexual activity: Not Currently  Other Topics Concern   Not on file  Social History Narrative   Lives with husband   Social Drivers of Health   Financial Resource Strain: Low Risk  (08/19/2023)   Overall Financial Resource Strain (CARDIA)    Difficulty of Paying Living Expenses: Not hard at all  Food Insecurity: No Food Insecurity (08/20/2023)   Hunger Vital Sign    Worried About Running Out of Food in the Last Year: Never true    Ran Out of Food in the Last Year: Never true  Transportation Needs: No Transportation Needs (08/20/2023)   PRAPARE - Administrator, Civil Service (Medical): No    Lack of Transportation (Non-Medical): No  Physical Activity: Inactive (08/19/2023)   Exercise Vital Sign    Days of Exercise per Week: 0 days    Minutes of Exercise per Session: 0 min  Stress: Stress Concern Present (08/19/2023)   [employer] of Occupational Health - Occupational Stress Questionnaire    Feeling of Stress : To some extent  Social Connections: Socially Integrated (08/20/2023)   Social Connection and Isolation Panel    Frequency of Communication  with Friends and Family: Twice a week    Frequency of Social Gatherings with Friends and Family: Once a week    Attends Religious Services: More than 4 times  per year    Active Member of [employer] or Organizations: No    Attends Engineer, structural: More than 4 times per year    Marital Status: Married  Catering manager Violence: Not At Risk (08/20/2023)   Humiliation, Afraid, Rape, and Kick questionnaire    Fear of Current or Ex-Partner: No    Emotionally Abused: No    Physically Abused: No    Sexually Abused: No    Review of Systems  All other systems reviewed and are negative.      Objective:   Physical Exam Vitals reviewed.  Constitutional:      Appearance: Normal appearance.  Cardiovascular:     Rate and Rhythm: Normal rate and regular rhythm.     Heart sounds: Normal heart sounds.  Pulmonary:     Effort: Pulmonary effort is normal.     Breath sounds: Normal breath sounds.  Skin:    Findings: Rash is not crusting, papular, pustular or vesicular.  Neurological:     Mental Status: She is alert.           Assessment & Plan:  Polypharmacy I explained to the patient the concerns of her insurance company.  She is 73 years old.  She is on narcotic as well as a benzodiazepine along with a sleeping medication and a muscle relaxer however she takes 2 oxycodone  a day typically first thing in the morning and in the mid afternoon.  She does not take the Klonopin  until right before bed along with the doxepin .  She has been on this regimen for many years and denies any memory loss, dizziness, falls, confusion.  She states that she is unable to stop the doxepin  or the Klonopin  due to severe uncontrolled anxiety or insomnia.  She seldom ever takes the Flexeril .  I recommended that she stop the Flexeril  out of precaution.  We discussed trying to wean down on the Klonopin  but she does not feel comfortable doing that.  She prefers to leave her medications as they are.  We discussed the risk but she feels that she is doing well on this combination and does not want to make a change

## 2024-03-07 ENCOUNTER — Other Ambulatory Visit: Payer: Self-pay | Admitting: Family Medicine

## 2024-03-08 ENCOUNTER — Ambulatory Visit: Admitting: Physician Assistant

## 2024-03-08 DIAGNOSIS — M47816 Spondylosis without myelopathy or radiculopathy, lumbar region: Secondary | ICD-10-CM

## 2024-03-08 DIAGNOSIS — M19042 Primary osteoarthritis, left hand: Secondary | ICD-10-CM

## 2024-03-08 DIAGNOSIS — L409 Psoriasis, unspecified: Secondary | ICD-10-CM

## 2024-03-08 DIAGNOSIS — R5383 Other fatigue: Secondary | ICD-10-CM

## 2024-03-08 DIAGNOSIS — L4050 Arthropathic psoriasis, unspecified: Secondary | ICD-10-CM

## 2024-03-08 DIAGNOSIS — Z96651 Presence of right artificial knee joint: Secondary | ICD-10-CM

## 2024-03-08 DIAGNOSIS — G2581 Restless legs syndrome: Secondary | ICD-10-CM

## 2024-03-08 DIAGNOSIS — M8589 Other specified disorders of bone density and structure, multiple sites: Secondary | ICD-10-CM

## 2024-03-08 DIAGNOSIS — F5101 Primary insomnia: Secondary | ICD-10-CM

## 2024-03-08 DIAGNOSIS — M5134 Other intervertebral disc degeneration, thoracic region: Secondary | ICD-10-CM

## 2024-03-08 DIAGNOSIS — Z96652 Presence of left artificial knee joint: Secondary | ICD-10-CM

## 2024-03-08 DIAGNOSIS — Z8669 Personal history of other diseases of the nervous system and sense organs: Secondary | ICD-10-CM

## 2024-03-08 DIAGNOSIS — Z8679 Personal history of other diseases of the circulatory system: Secondary | ICD-10-CM

## 2024-03-08 DIAGNOSIS — M797 Fibromyalgia: Secondary | ICD-10-CM

## 2024-03-08 DIAGNOSIS — M5030 Other cervical disc degeneration, unspecified cervical region: Secondary | ICD-10-CM

## 2024-03-08 DIAGNOSIS — Z8639 Personal history of other endocrine, nutritional and metabolic disease: Secondary | ICD-10-CM

## 2024-03-08 DIAGNOSIS — Z8659 Personal history of other mental and behavioral disorders: Secondary | ICD-10-CM

## 2024-03-08 DIAGNOSIS — Z79899 Other long term (current) drug therapy: Secondary | ICD-10-CM

## 2024-03-08 DIAGNOSIS — Z8719 Personal history of other diseases of the digestive system: Secondary | ICD-10-CM

## 2024-03-09 ENCOUNTER — Other Ambulatory Visit: Payer: Self-pay | Admitting: Family Medicine

## 2024-03-09 NOTE — Telephone Encounter (Signed)
 Copied from CRM [id number]. Topic: Clinical - Medication Refill >> Mar 09, 2024  4:07 PM Amy B wrote: Medication: INGREZZA  80 MG capsule  Has the patient contacted their pharmacy? Yes (Agent: If no, request that the patient contact the pharmacy for the refill. If patient does not wish to contact the pharmacy document the reason why and proceed with request.) (Agent: If yes, when and what did the pharmacy advise?)  This is the patient's preferred pharmacy:   [facility] (Preferred) - [place], [place] - [address] [place] [address] Phone: [phone] Fax: [phone]  Is this the correct pharmacy for this prescription? Yes If no, delete pharmacy and type the correct one.   Has the prescription been filled recently? No  Is the patient out of the medication? No  Has the patient been seen for an appointment in the last year OR does the patient have an upcoming appointment? Yes  Can we respond through MyChart? Yes  Agent: Please be advised that Rx refills may take up to 3 business days. We ask that you follow-up with your pharmacy.

## 2024-03-13 NOTE — Telephone Encounter (Signed)
 Requested medication (s) are due for refill today: routing for review  Requested medication (s) are on the active medication list: yes  Last refill:  01/15/22  Future visit scheduled: no  Notes to clinic:  Unable to refill per protocol, last refill by another provider. Routing to PCP for approval.     Requested Prescriptions  Pending Prescriptions Disp Refills   valbenazine  (INGREZZA ) 80 MG capsule 30 capsule 1    Sig: Take 1 capsule (80 mg total) by mouth daily.     Off-Protocol Failed - 03/13/2024  8:21 AM      Failed - Medication not assigned to a protocol, review manually.      Passed - Valid encounter within last 12 months    Recent Outpatient Visits           2 weeks ago Polypharmacy   [facility] Duanne, Butler DASEN, MD   2 months ago Seasonal allergies   [place] [facility] Duanne, Butler DASEN, MD   3 months ago Psoriatic arthritis [facility])   [place] [facility] Duanne Butler DASEN, MD   4 months ago High blood sugar   [place] [facility] Duanne Butler DASEN, MD   5 months ago Cellulitis of ankle   [facility] Aletha Bene, MD       Future Appointments             In 3 weeks Cheryl Waddell HERO, PA-C [facility]ology - A Dept Of [place]. [facility]

## 2024-03-17 ENCOUNTER — Other Ambulatory Visit: Payer: Self-pay | Admitting: Family Medicine

## 2024-03-20 ENCOUNTER — Other Ambulatory Visit: Payer: Self-pay | Admitting: Family Medicine

## 2024-03-20 ENCOUNTER — Telehealth: Payer: Self-pay | Admitting: Family Medicine

## 2024-03-20 MED ORDER — VALBENAZINE TOSYLATE 80 MG PO CAPS: 80.0000 mg | ORAL_CAPSULE | Freq: Every day | ORAL | 3 refills | Status: AC

## 2024-03-20 NOTE — Telephone Encounter (Unsigned)
 Copied from CRM #[id number]. Topic: Clinical - Prescription Issue >> Mar 20, 2024  1:35 PM Donee H wrote: Reason for CRM: Patient called regarding medication INGREZZA  80 MG capsule. She states pharmacy is having issues with filling prescription due to needing a new order. Asking for new prescription to be sent to     [facility] (Preferred) - [place], [place] - [address] [place] [address] Phone: [phone] Fax: [phone]

## 2024-03-20 NOTE — Telephone Encounter (Unsigned)
 Copied from CRM [id number]. Topic: Clinical - Medication Refill >> Mar 20, 2024  1:29 PM Donee H wrote: Medication: oxyCODONE -acetaminophen  (PERCOCET) 5-325 MG tablet   Has the patient contacted their pharmacy? No  This is the patient's preferred pharmacy:  [id number] [address], [place] - [address] [place] [address] Phone: [phone] Fax: [phone]    Is this the correct pharmacy for this prescription? Yes  Has the prescription been filled recently? No  Is the patient out of the medication? No, but only has 2 left  Has the patient been seen for an appointment in the last year OR does the patient have an upcoming appointment? Yes  Can we respond through MyChart? Yes  Agent: Please be advised that Rx refills may take up to 3 business days. We ask that you follow-up with your pharmacy.

## 2024-03-21 NOTE — Telephone Encounter (Signed)
 Requested medications are due for refill today.  yes  Requested medications are on the active medications list.  yes  Last refill. 02/10/2024 #60 0 rf  Future visit scheduled.   Yes - next year  Notes to clinic.  Refill not delegated.    Requested Prescriptions  Pending Prescriptions Disp Refills   oxyCODONE -acetaminophen  (PERCOCET) 5-325 MG tablet 60 tablet 0    Sig: Take 1 tablet by mouth every 8 (eight) hours as needed for severe pain (pain score 7-10). Ok to fill after 01/01/23     Not Delegated - Analgesics:  Opioid Agonist Combinations Failed - 03/21/2024  6:03 PM      Failed - This refill cannot be delegated      Failed - Urine Drug Screen completed in last 360 days      Passed - Valid encounter within last 3 months    Recent Outpatient Visits           3 weeks ago Polypharmacy   [place] [facility] Duanne Butler DASEN, MD   2 months ago Seasonal allergies   [place] [facility] Duanne Butler DASEN, MD   3 months ago Psoriatic arthritis [facility])   [place] [facility] Duanne Butler DASEN, MD   4 months ago High blood sugar   [place] [facility] Duanne Butler DASEN, MD   5 months ago Cellulitis of ankle   [place] [facility] Aletha Bene, MD       Future Appointments             In 2 weeks Cheryl Waddell HERO, PA-C [facility] Rhe[facility]ology - A Dept Of [place]. [facility]

## 2024-03-22 NOTE — Progress Notes (Unsigned)
 Office Visit Note  Patient: Janet Peterson             Date of Birth: January 01, 1951           MRN: [id number]             PCP: Duanne Butler DASEN, MD Referring: Duanne Butler DASEN, MD Visit Date: 04/05/2024 Occupation: Data Unavailable  Subjective:  Medication monitoring   History of Present Illness: Janet Peterson is a 73 y.o. female with history of psoriatic arthritis and osteoarthritis.  Patient remains on  Arava  20 mg 1 tablet by mouth daily.  She is tolerating Arava  without any side effects and has not had any recent gaps in therapy.  She denies any signs or symptoms of a psoriatic arthritis flare.  She denies any joint swelling. She denies any active psoriasis at this time.  She denies any increased SI joint pain.  She continues to have chronic lumbar pain for which she takes oxycodone  for pain relief.  Patient states that she experiences a popping sensation in the right knee replacement when rising from a seated position.  She denies any discomfort in the left knee replacement.  She denies any Achilles tendinitis or plantar fasciitis.  She denies any recent or recurrent infections.  She denies any surgeries or hospitalizations.  Activities of Daily Living:  Patient reports morning stiffness for 1 hour.   Patient Denies nocturnal pain.  Difficulty dressing/grooming: Denies Difficulty climbing stairs: Reports Difficulty getting out of chair: Reports Difficulty using hands for taps, buttons, cutlery, and/or writing: Reports  Review of Systems  Constitutional:  Positive for fatigue.  HENT:  Negative for mouth sores and mouth dryness.   Eyes:  Positive for dryness.  Respiratory:  Positive for shortness of breath.   Cardiovascular:  Negative for chest pain and palpitations.  Gastrointestinal:  Positive for constipation. Negative for blood in stool and diarrhea.  Endocrine: Negative for increased urination.  Genitourinary:  Negative for involuntary urination.  Musculoskeletal:  Positive for  joint pain, gait problem, joint pain, joint swelling, myalgias, muscle weakness, morning stiffness, muscle tenderness and myalgias.  Skin:  Negative for color change, rash, hair loss and sensitivity to sunlight.  Allergic/Immunologic: Positive for susceptible to infections.  Neurological:  Positive for headaches. Negative for dizziness.  Hematological:  Negative for swollen glands.  Psychiatric/Behavioral:  Positive for depressed mood and sleep disturbance. The patient is nervous/anxious.     PMFS History:  Patient Active Problem List   Diagnosis Date Noted   Muscle pain 01/20/2024   Osteoarthritis of left glenohumeral joint 01/11/2024   Chronic rhinitis 12/12/2023   Other specified disorders of eustachian tube, bilateral 12/12/2023   Abnormal findings on diagnostic imaging of other specified body structures 09/28/2023   Stiffness of right knee 09/07/2023   Status post total knee replacement, right 08/20/2023   Muscle weakness (generalized) 06/28/2023   Arthritis of right knee 06/03/2023   Tardive dyskinesia 05/31/2023   Acute cystitis without hematuria 04/07/2023   Pain in joint of right knee 04/01/2023   Sensorineural hearing loss, 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  difficile colitis 09/29/2019   Colitis 09/28/2019   Anxiety and depression 09/28/2019   DDD (degenerative disc disease), cervical s/p fusion 11/12/2016   H/O total knee replacement, left 05/06/2016   DDD lumbar spine status post fusion 05/06/2016   Psoriasis 05/05/2016   High risk medication use 05/05/2016   Cervical  post-laminectomy syndrome 12/09/2015   Thoracic postlaminectomy syndrome 12/09/2015   Psoriatic arthritis (HCC) 08/23/2012   Osteopenia 08/23/2012   HLD (hyperlipidemia) 08/23/2012   RLS (restless legs syndrome) 08/23/2012   Insomnia 08/23/2012   Fibromyalgia syndrome 08/12/2012   Low back pain 08/12/2012   Syncope    PVC (premature ventricular contraction)    OCD (obsessive compulsive disorder)    GERD (gastroesophageal reflux disease)    Hypertension     Past Medical History:  Diagnosis Date   Anemia    Ankylosing spondylitis (HCC)    Anxiety and depression    Cancer (HCC)    basal cell skin cancer   Depression    Dyspnea    Fibromyalgia    GERD (gastroesophageal reflux disease)    Hiatal hernia    per patient, dx by GI    History of basal cell carcinoma excision    FACE, 1992  &  1996   History of hiatal hernia    History of thrush    Hyperlipidemia    Hypertension    Insomnia    Left breast mass    OCD (obsessive compulsive disorder)    Osteopenia    PONV (postoperative nausea and vomiting)    Psoriatic arthritis (HCC)    PVC (premature ventricular contraction)    Restrictive lung disease    Rheumatoid arthritis (HCC)    Tardive dyskinesia     Family History  Problem Relation Age of Onset   Diabetes Mother    Heart disease Mother    Diabetes Father    Anuerysm Father    Diabetes Brother    Heart disease Brother    Diabetes Brother    Heart disease Brother    Breast cancer Neg Hx    Past Surgical History:  Procedure Laterality Date   BIOPSY  09/29/2019   Procedure: BIOPSY;  Surgeon: Saintclair Jasper, MD;  Location: [facility];  Service: Gastroenterology;;   BREAST EXCISIONAL BIOPSY Right    BREAST EXCISIONAL BIOPSY Left 05/04/2018   BREAST LUMPECTOMY WITH RADIOACTIVE SEED LOCALIZATION Left 05/04/2018   Procedure: LEFT BREAST LUMPECTOMY WITH RADIOACTIVE SEED LOCALIZATION AND LEFT BREAST NIPPLE BIOPSY;  Surgeon: Curvin Deward MOULD, MD;  Location: [facility];  Service: General;  Laterality: Left;   BREAST SURGERY  05/25/1973   lumpectomy-- benign   BUNIONECTOMY  05/25/1989   CATARACT EXTRACTION W/ INTRAOCULAR LENS  IMPLANT, BILATERAL  05/25/1998   CERVICAL FUSION  07/24/2011   C5 -- C7   CESAREAN SECTION  05/26/1983   COLONOSCOPY WITH PROPOFOL  N/A 09/29/2019   Procedure: COLONOSCOPY WITH PROPOFOL ;  Surgeon: Saintclair Jasper, MD;  Location: [facility];  Service: Gastroenterology;  Laterality: N/A;   DISTAL INTERPHALANGEAL JOINT FUSION Right 03/14/2015   Procedure: RIGHT LONG FINGER DISTAL INTERPHALANGEAL JOINT ARTHRODESIS;  Surgeon: Prentice Pagan, MD;  Location: [facility];  Service: Orthopedics;  Laterality: Right;   ESOPHAGOGASTRODUODENOSCOPY (EGD) WITH PROPOFOL  N/A 09/29/2019   Procedure: ESOPHAGOGASTRODUODENOSCOPY (EGD) WITH PROPOFOL ;  Surgeon: Saintclair Jasper, MD;  Location: [facility];  Service: Gastroenterology;  Laterality: N/A;   EYE SURGERY  05/25/1993   rk (laser surgery), semi cornea transplant, detacted retina,  fluid removal  HYSTEROSCOPY WITH D & C N/A 12/09/2020   Procedure: DILATATION AND CURETTAGE /HYSTEROSCOPY;  Surgeon: Storm Setter, DO;  Location: [facility];  Service: Gynecology;  Laterality: N/A;   KNEE ARTHROSCOPY Left 03/03/2004   POLYPECTOMY  09/29/2019   Procedure: POLYPECTOMY;  Surgeon: Saintclair Jasper, MD;  Location: [facility];  Service: Gastroenterology;;   POSTERIOR VITRECTOMY RIGHT EYE AND LASER   10/27/1999   right foot surgery      SHOULDER SURGERY Right 05/26/1995   SKIN BIOPSY  05/2022   lower stomach   SPINAL FIXATION SURGERY W/ IMPLANT  2013 rod #1//  2014  rod 2   S1 -- T10  (rod #1)//   S1 -- T4 (rod #2)   THORACIC FUSION  03/13/2013   REMOVAL HARDWARE/  BONE GRAFT FUSION T10//  REVISION OF RODS   TOTAL KNEE ARTHROPLASTY Left 12/14/2005   TOTAL KNEE ARTHROPLASTY Right 08/20/2023   Procedure: ARTHROPLASTY, KNEE, TOTAL;  Surgeon: Kay Kemps, MD;  Location:  [facility];  Service: Orthopedics;  Laterality: Right;   UVULOPALATOPHARYNGOPLASTY  04/26/2006   w/  TONSILLECTOMY/  TURBINATE REDUCTIONS/  BILATERAL ANTERIOR ETHMOIDECTOMY   Social History   Tobacco Use   Smoking status: Former    Current packs/day: 0.00    Average packs/day: 0.5 packs/day for 10.0 years (5.0 ttl pk-yrs)    Types: Cigarettes    Start date: 03/06/1985    Quit date: 03/07/1995    Years since quitting: 29.1    Passive exposure: Never   Smokeless tobacco: Never  Vaping Use   Vaping status: Never Used  Substance Use Topics   Alcohol use: Yes    Comment: rare   Drug use: Never   Social History   Social History Narrative   Lives with husband     Immunization History  Administered Date(s) Administered   Fluad Quad(high Dose 65+) 01/26/2019, 03/05/2020, 03/18/2022   Fluad Trivalent(High Dose 65+) 03/16/2023   INFLUENZA, HIGH DOSE SEASONAL PF 03/25/2018, 02/25/2024   Influenza Whole 03/04/2012   Influenza,inj,Quad PF,6+ Mos 02/28/2013, 03/07/2014, 02/28/2015, 03/27/2016, 03/09/2017   Influenza-Unspecified 04/08/2021   PFIZER(Purple Top)SARS-COV-2 Vaccination 08/10/2019   PNEUMOCOCCAL CONJUGATE-20 10/27/2022   Pneumococcal Conjugate-13 09/10/2016   Pneumococcal Polysaccharide-23 03/12/2006, 11/09/2011   Pneumococcal-Unspecified 08/31/2016   Unspecified SARS-COV-2 Vaccination 08/07/2019, 08/28/2019, 03/08/2020   Zoster Recombinant(Shingrix) 08/12/2021, 11/21/2021     Objective: Vital Signs: BP 110/78   Pulse 93   Temp (!) 97.5 F (36.4 C)   Resp 16   Ht 5' 3.5 (1.613 m)   Wt 192 lb 9.6 oz (87.4 kg)   BMI 33.58 kg/m    Physical Exam Vitals and nursing note reviewed.  Constitutional:      Appearance: She is well-developed.  HENT:     Head: Normocephalic and atraumatic.  Eyes:     Conjunctiva/sclera: Conjunctivae normal.  Cardiovascular:     Rate and Rhythm: Normal rate and regular rhythm.     Heart sounds: Normal heart sounds.  Pulmonary:      Effort: Pulmonary effort is normal.     Breath sounds: Normal breath sounds.  Abdominal:     General: Bowel sounds are normal.     Palpations: Abdomen is soft.  Musculoskeletal:     Cervical back: Normal range of motion.  Lymphadenopathy:     Cervical: No cervical adenopathy.  Skin:    General: Skin is warm and dry.     Capillary Refill: Capillary refill takes less than 2 seconds.  Neurological:  Mental Status: She is alert and oriented to person, place, and time.  Psychiatric:        Behavior: Behavior normal.      Musculoskeletal Exam: C-spine, thoracic spine, lumbar spine have good range of motion.  No midline spinal tenderness.  No SI joint tenderness.  Shoulder joints, elbow joints, wrist joints, MCPs, PIPs, DIPs have good range of motion with no synovitis.  Complete fist formation bilaterally.  Hip joints have good range of motion with no groin pain.  Knee joints have good range of motion no warmth or effusion.  Ankle joints have good range of motion no tenderness or joint swelling.  No evidence of Achilles tendinitis or plantar fasciitis.   CDAI Exam: CDAI Score: -- Patient Global: --; Provider Global: -- Swollen: --; Tender: -- Joint Exam 04/05/2024   No joint exam has been documented for this visit   There is currently no information documented on the homunculus. Go to the Rheumatology activity and complete the homunculus joint exam.  Investigation: No additional findings.  Imaging: No results found.  Recent Labs: Lab Results  Component Value Date   WBC 6.1 12/07/2023   HGB 11.7 12/07/2023   PLT 271 12/07/2023   NA 134 (L) 12/07/2023   K 4.9 12/07/2023   CL 100 12/07/2023   CO2 28 12/07/2023   GLUCOSE 98 12/07/2023   BUN 11 12/07/2023   CREATININE 0.80 12/07/2023   BILITOT 0.3 12/07/2023   ALKPHOS 99 05/27/2023   AST 15 12/07/2023   ALT 8 12/07/2023   PROT 6.1 12/07/2023   ALBUMIN  3.6 05/27/2023   CALCIUM 10.1 12/07/2023   GFRAA 82 09/26/2020    QFTBGOLD Indeterminate 08/14/2016   QFTBGOLDPLUS Negative 12/22/2018    Speciality Comments: Simponi  Aria every 8 weeks started Jan 2018  TB gold negative 08/19/16  Procedures:  No procedures performed Allergies: Latex, Amlodipine  besy-benazepril hcl, Amoxicillin-pot clavulanate, Bextra [valdecoxib], Hydrochlorothiazide , Lipitor [atorvastatin calcium], Molds & smuts, Spironolactone , Sulfa antibiotics, Zocor [simvastatin], and Chlorhexidine     Assessment / Plan:     Visit Diagnoses: Psoriatic arthritis (HCC): She has no synovitis or dactylitis on examination today.  She has clinically been doing well taking Arava  20 mg 1 tablet by mouth daily.  She is tolerating Arava  without any side effects and has not had any gaps in therapy.  No recent or recurrent infections.  No Achilles tendinitis or plantar fasciitis.  No active psoriasis at this time.  No SI joint pain currently.  She does have chronic lumbar spinal pain and has been taking Percocet as needed for pain relief. Overall her symptoms are well-controlled taking Arava  as prescribed.  No medication changes will be made at this time.  She was advised to notify us  if she develops any signs or symptoms of a flare.  She will follow-up in the office in 5 months or sooner if needed.  Psoriasis: No active psoriasis at this time.   High risk medication use - Arava  20 mg 1 tablet by mouth daily.  Previously was on Simponi  Aria IV infusion 2 mg/kg every 8 weeks-patient discontinued in March 2021 due to C. Difficile. CBC and CMP updated on 12/07/23. Orders for CBC and CMP released today.  No recent or recurrent infections. Discussed the importance of holding arava  if she develops signs or symptoms of an infection and to resume once the infection has completely cleared.   - Plan: CBC with Differential/Platelet, Comprehensive metabolic panel with GFR  Primary osteoarthritis of both hands: She has PIP  and DIP thickening consistent with osteoarthritis of both  hands.  No synovitis or dactylitis noted on exam.  S/P TKR (total knee replacement), right - [place]co right knee June/July 2023.  Patient underwent a right total knee replacement on 08/20/2023.  She experiences a popping sensation when rising from a seated position.  Mild clicking was noted when moving from a flexed to extended position.  No effusion noted.  Patient has some issues with balance and he benefit from physical therapy for fall prevention and lower extremity strengthening.  H/O total knee replacement, left: Doing well.  No warmth or effusion noted.  DDD (degenerative disc disease), cervical s/p fusion: C-spine has limited range of motion.  DDD (degenerative disc disease), thoracic: Chronic pain.  DDD lumbar spine status post fusion: Chronic pain.  Patient takes oxycodone  for pain relief.  Fibromyalgia - She has intermittent myalgias and muscle tenderness due to fibromyalgia.  She takes methocarbamol  500 mg 1 tablet every 8 hours as needed for muscle spasms.  Other fatigue - Patient requested to have TSH checked today. Plan: TSH  Osteopenia of multiple sites - DEXA scan from April 02 2021 DEXA showed T score -1.3.  BMD 0.857 in the left femoral neck.   Other medical conditions are listed as follows:   Primary insomnia  History of gastroesophageal reflux (GERD)  History of hyperlipidemia  History of hypertension: Blood pressure was 110/78 today in the office.  History of OCD (obsessive compulsive disorder)  RLS (restless legs syndrome)  History of migraine  Orders: Orders Placed This Encounter  Procedures   CBC with Differential/Platelet   Comprehensive metabolic panel with GFR   TSH   No orders of the defined types were placed in this encounter.   Follow-Up Instructions: Return in about 5 months (around 09/03/2024) for Psoriatic arthritis, Osteoarthritis.   Waddell CHRISTELLA Craze, PA-C  Note - This record has been created using Dragon software.  Chart creation errors  have been sought, but may not always  have been located. Such creation errors do not reflect on  the standard of medical care.

## 2024-03-24 MED ORDER — OXYCODONE-ACETAMINOPHEN 5-325 MG PO TABS: 1.0000 | ORAL_TABLET | Freq: Three times a day (TID) | ORAL | 0 refills | Status: DC | PRN

## 2024-04-05 ENCOUNTER — Encounter: Payer: Self-pay | Admitting: Physician Assistant

## 2024-04-05 ENCOUNTER — Ambulatory Visit: Attending: Physician Assistant | Admitting: Physician Assistant

## 2024-04-05 VITALS — BP 110/78 | HR 93 | Temp 97.5°F | Resp 16 | Ht 63.5 in | Wt 192.6 lb

## 2024-04-05 DIAGNOSIS — L4050 Arthropathic psoriasis, unspecified: Secondary | ICD-10-CM | POA: Diagnosis not present

## 2024-04-05 DIAGNOSIS — Z79899 Other long term (current) drug therapy: Secondary | ICD-10-CM | POA: Diagnosis not present

## 2024-04-05 DIAGNOSIS — F5101 Primary insomnia: Secondary | ICD-10-CM

## 2024-04-05 DIAGNOSIS — M8589 Other specified disorders of bone density and structure, multiple sites: Secondary | ICD-10-CM

## 2024-04-05 DIAGNOSIS — M47816 Spondylosis without myelopathy or radiculopathy, lumbar region: Secondary | ICD-10-CM

## 2024-04-05 DIAGNOSIS — M19041 Primary osteoarthritis, right hand: Secondary | ICD-10-CM | POA: Diagnosis not present

## 2024-04-05 DIAGNOSIS — L409 Psoriasis, unspecified: Secondary | ICD-10-CM | POA: Diagnosis not present

## 2024-04-05 DIAGNOSIS — G2581 Restless legs syndrome: Secondary | ICD-10-CM

## 2024-04-05 DIAGNOSIS — Z8639 Personal history of other endocrine, nutritional and metabolic disease: Secondary | ICD-10-CM

## 2024-04-05 DIAGNOSIS — M797 Fibromyalgia: Secondary | ICD-10-CM

## 2024-04-05 DIAGNOSIS — M5030 Other cervical disc degeneration, unspecified cervical region: Secondary | ICD-10-CM

## 2024-04-05 DIAGNOSIS — Z8669 Personal history of other diseases of the nervous system and sense organs: Secondary | ICD-10-CM

## 2024-04-05 DIAGNOSIS — Z8719 Personal history of other diseases of the digestive system: Secondary | ICD-10-CM

## 2024-04-05 DIAGNOSIS — Z8659 Personal history of other mental and behavioral disorders: Secondary | ICD-10-CM

## 2024-04-05 DIAGNOSIS — Z96652 Presence of left artificial knee joint: Secondary | ICD-10-CM

## 2024-04-05 DIAGNOSIS — Z96651 Presence of right artificial knee joint: Secondary | ICD-10-CM

## 2024-04-05 DIAGNOSIS — R5383 Other fatigue: Secondary | ICD-10-CM

## 2024-04-05 DIAGNOSIS — Z8679 Personal history of other diseases of the circulatory system: Secondary | ICD-10-CM

## 2024-04-05 DIAGNOSIS — M19042 Primary osteoarthritis, left hand: Secondary | ICD-10-CM

## 2024-04-05 DIAGNOSIS — M5134 Other intervertebral disc degeneration, thoracic region: Secondary | ICD-10-CM

## 2024-04-05 LAB — CBC WITH DIFFERENTIAL/PLATELET
Absolute Lymphocytes: 1578 {cells}/uL (ref 850–3900)
Absolute Monocytes: 464 {cells}/uL (ref 200–950)
Basophils Absolute: 58 {cells}/uL (ref 0–200)
Basophils Relative: 1 %
Eosinophils Absolute: 128 {cells}/uL (ref 15–500)
Eosinophils Relative: 2.2 %
HCT: 35.3 % (ref 35.0–45.0)
Hemoglobin: 11.8 g/dL (ref 11.7–15.5)
MCH: 32 pg (ref 27.0–33.0)
MCHC: 33.4 g/dL (ref 32.0–36.0)
MCV: 95.7 fL (ref 80.0–100.0)
MPV: 10.4 fL (ref 7.5–12.5)
Monocytes Relative: 8 %
Neutro Abs: 3573 {cells}/uL (ref 1500–7800)
Neutrophils Relative %: 61.6 %
Platelets: 268 Thousand/uL (ref 140–400)
RBC: 3.69 Million/uL — ABNORMAL LOW (ref 3.80–5.10)
RDW: 13 % (ref 11.0–15.0)
Total Lymphocyte: 27.2 %
WBC: 5.8 Thousand/uL (ref 3.8–10.8)

## 2024-04-05 LAB — COMPREHENSIVE METABOLIC PANEL WITH GFR
AG Ratio: 1.6 (calc) (ref 1.0–2.5)
ALT: 7 U/L (ref 6–29)
AST: 13 U/L (ref 10–35)
Albumin: 3.9 g/dL (ref 3.6–5.1)
Alkaline phosphatase (APISO): 98 U/L (ref 37–153)
BUN: 10 mg/dL (ref 7–25)
CO2: 24 mmol/L (ref 20–32)
Calcium: 10 mg/dL (ref 8.6–10.4)
Chloride: 99 mmol/L (ref 98–110)
Creat: 0.74 mg/dL (ref 0.60–1.00)
Globulin: 2.4 g/dL (ref 1.9–3.7)
Glucose, Bld: 99 mg/dL (ref 65–99)
Potassium: 4.5 mmol/L (ref 3.5–5.3)
Sodium: 132 mmol/L — ABNORMAL LOW (ref 135–146)
Total Bilirubin: 0.3 mg/dL (ref 0.2–1.2)
Total Protein: 6.3 g/dL (ref 6.1–8.1)
eGFR: 85 mL/min/1.73m2 (ref >=60)

## 2024-04-05 LAB — TSH: TSH: 0.67 m[IU]/L (ref 0.40–4.50)

## 2024-04-06 ENCOUNTER — Ambulatory Visit: Payer: Self-pay | Admitting: Physician Assistant

## 2024-04-06 NOTE — Progress Notes (Signed)
 TSH WNL RBC count remains low-stable. Rest of CBC WNL Sodium remains low-132, rest of CMP WNL.  Please forward results to PCP as requested.

## 2024-04-11 ENCOUNTER — Other Ambulatory Visit: Payer: Self-pay | Admitting: Family Medicine

## 2024-04-11 DIAGNOSIS — E782 Mixed hyperlipidemia: Secondary | ICD-10-CM

## 2024-04-11 DIAGNOSIS — R0609 Other forms of dyspnea: Secondary | ICD-10-CM

## 2024-04-11 DIAGNOSIS — I10 Essential (primary) hypertension: Secondary | ICD-10-CM

## 2024-04-11 NOTE — Telephone Encounter (Signed)
 Copied from CRM [id number]. Topic: Clinical - Medication Refill >> Apr 11, 2024  1:45 PM Antony S wrote: Medication:  amLODipine  (NORVASC ) 5 MG tablet  Evolocumab  (REPATHA  SURECLICK) 140 MG/ML SOAJ     Has the patient contacted their pharmacy? Yes (Agent: If no, request that the patient contact the pharmacy for the refill. If patient does not wish to contact the pharmacy document the reason why and proceed with request.) (Agent: If yes, when and what did the pharmacy advise?)  This is the patient's preferred pharmacy:  [id number] [place], [place] - [address] [place] [address] Phone: [phone] Fax: [phone]  Is this the correct pharmacy for this prescription? Yes If no, delete pharmacy and type the correct one.   Has the prescription been filled recently? No  Is the patient out of the medication? No  Has the patient been seen for an appointment in the last year OR does the patient have an upcoming appointment? Yes  Can we respond through MyChart? Yes  Agent: Please be advised that Rx refills may take up to 3 business days. We ask that you follow-up with your pharmacy.

## 2024-04-14 MED ORDER — REPATHA SURECLICK 140 MG/ML ~~LOC~~ SOAJ: 1.0000 mL | SUBCUTANEOUS | 11 refills | Status: AC

## 2024-04-14 MED ORDER — AMLODIPINE BESYLATE 5 MG PO TABS: 5.0000 mg | ORAL_TABLET | Freq: Every day | ORAL | 1 refills | Status: AC

## 2024-04-14 NOTE — Telephone Encounter (Signed)
 Requested medication (s) are due for refill today: yes  Requested medication (s) are on the active medication list: yes  Last refill:  10/13/23  Future visit scheduled: yes  Notes to clinic:  Unable to refill per protocol, last refill by another provider.      Requested Prescriptions  Pending Prescriptions Disp Refills   amLODipine  (NORVASC ) 5 MG tablet 90 tablet 1    Sig: Take 1 tablet (5 mg total) by mouth daily.     Cardiovascular: Calcium Channel Blockers 2 Passed - 04/14/2024  8:33 AM      Passed - Last BP in normal range    BP Readings from Last 1 Encounters:  04/05/24 110/78         Passed - Last Heart Rate in normal range    Pulse Readings from Last 1 Encounters:  04/05/24 93         Passed - Valid encounter within last 6 months    Recent Outpatient Visits           1 month ago Polypharmacy   [facility] [facility] Duanne Butler DASEN, MD   3 months ago Seasonal allergies   [place] [facility] Duanne, Butler DASEN, MD   4 months ago Psoriatic arthritis [facility])   [place] [facility] Duanne Butler DASEN, MD   5 months ago High blood sugar   [place] [facility] Duanne Butler DASEN, MD   6 months ago Cellulitis of ankle   [place] [facility] Aletha Bene, MD       Future Appointments             In 4 months Cheryl Waddell CHRISTELLA RIGGERS [facility] - A Dept Of [place]. [facility]             Evolocumab  (REPATHA  SURECLICK) 140 MG/ML SOAJ 2 mL 11    Sig: Inject 140 mg into the skin every 14 (fourteen) days.     Cardiovascular: PCSK9 Inhibitors Failed - 04/14/2024  8:33 AM      Failed - Lipid Panel completed within the last 12 months    Cholesterol, Total  Date Value Ref Range Status  01/20/2023 173 100 - 199 mg/dL Final   LDL Cholesterol (Calc)  Date Value Ref Range Status  06/04/2021 108 (H) mg/dL (calc) Final    Comment:    Reference  range: <100 . Desirable range <100 mg/dL for primary prevention;   <70 mg/dL for patients with CHD or diabetic patients  with > or = 2 CHD risk factors. SABRA LDL-C is now calculated using the Martin-Hopkins  calculation, which is a validated novel method providing  better accuracy than the Friedewald equation in the  estimation of LDL-C.  Gladis APPLETHWAITE et al. SANDREA. 7986;689(80): 2061-2068  (http://education.[employer].com/faq/FAQ164)    LDL Chol Calc (NIH)  Date Value Ref Range Status  01/20/2023 96 0 - 99 mg/dL Final   HDL  Date Value Ref Range Status  01/20/2023 55 >39 mg/dL Final   Triglycerides  Date Value Ref Range Status  01/20/2023 124 0 - 149 mg/dL Final         Passed - Valid encounter within last 12 months    Recent Outpatient Visits           1 month ago Polypharmacy   [place] [facility] Duanne Butler DASEN, MD   3 months ago Seasonal allergies  [place] [facility] Duanne, Butler DASEN, MD   4 months ago Psoriatic arthritis [facility])   [place] [facility] Duanne, Butler DASEN, MD   5 months ago High blood sugar   [place] [facility] Duanne Butler DASEN, MD   6 months ago Cellulitis of ankle   [place] [facility] Aletha Bene, MD       Future Appointments             In 4 months Cheryl Waddell CHRISTELLA RIGGERS [facility] - A Dept Of [place]. [facility]

## 2024-04-24 ENCOUNTER — Telehealth: Payer: Self-pay | Admitting: Family Medicine

## 2024-04-24 NOTE — Telephone Encounter (Signed)
 Copied from CRM [id number]. Topic: Clinical - Medication Refill >> Apr 24, 2024  2:18 PM Everette C wrote: Medication: oxyCODONE -acetaminophen  (PERCOCET) 5-325 MG tablet [[id number]]  Has the patient contacted their pharmacy? No (Agent: If no, request that the patient contact the pharmacy for the refill. If patient does not wish to contact the pharmacy document the reason why and proceed with request.) (Agent: If yes, when and what did the pharmacy advise?)  This is the patient's preferred pharmacy:  [id number] [place], [place] - [address] [place] [address] Phone: [phone] Fax: [phone]  Is this the correct pharmacy for this prescription? Yes If no, delete pharmacy and type the correct one.   Has the prescription been filled recently? No  Is the patient out of the medication? Yes  Has the patient been seen for an appointment in the last year OR does the patient have an upcoming appointment? Yes  Can we respond through MyChart? No  Agent: Please be advised that Rx refills may take up to 3 business days. We ask that you follow-up with your pharmacy.

## 2024-04-25 ENCOUNTER — Other Ambulatory Visit: Payer: Self-pay | Admitting: Family Medicine

## 2024-04-25 ENCOUNTER — Telehealth: Payer: Self-pay

## 2024-04-25 MED ORDER — OXYCODONE-ACETAMINOPHEN 5-325 MG PO TABS: 1.0000 | ORAL_TABLET | Freq: Three times a day (TID) | ORAL | 0 refills | Status: DC | PRN

## 2024-04-25 NOTE — Telephone Encounter (Signed)
 Copied from CRM [id number]. Topic: Clinical - Medication Refill >> Apr 24, 2024  2:18 PM Everette C wrote: Medication: oxyCODONE -acetaminophen  (PERCOCET) 5-325 MG tablet [[id number]]  Has the patient contacted their pharmacy? No (Agent: If no, request that the patient contact the pharmacy for the refill. If patient does not wish to contact the pharmacy document the reason why and proceed with request.) (Agent: If yes, when and what did the pharmacy advise?)  This is the patient's preferred pharmacy:  [id number] [place], [place] - [address] [place] [address] Phone: [phone] Fax: [phone]  Is this the correct pharmacy for this prescription? Yes If no, delete pharmacy and type the correct one.   Has the prescription been filled recently? No  Is the patient out of the medication? Yes  Has the patient been seen for an appointment in the last year OR does the patient have an upcoming appointment? Yes  Can we respond through MyChart? No  Agent: Please be advised that Rx refills may take up to 3 business days. We ask that you follow-up with your pharmacy. >> Apr 25, 2024  3:33 PM Amy B wrote: 2nd attempt:  Patient called again requesting refill of oxyCODONE -acetaminophen  (PERCOCET) 5-325 MG tablet

## 2024-04-27 NOTE — Telephone Encounter (Signed)
 Requested medication (s) are due for refill today: yes  Requested medication (s) are on the active medication list: yes  Last refill:  03/24/24  Future visit scheduled: yes  Notes to clinic:  Unable to refill per protocol, cannot delegate.Unable to refuse, duplicate request.      Requested Prescriptions  Pending Prescriptions Disp Refills   oxyCODONE -acetaminophen  (PERCOCET) 5-325 MG tablet 60 tablet 0    Sig: Take 1 tablet by mouth every 8 (eight) hours as needed for severe pain (pain score 7-10). Ok to fill after 01/01/23     Not Delegated - Analgesics:  Opioid Agonist Combinations Failed - 04/27/2024  2:42 PM      Failed - This refill cannot be delegated      Failed - Urine Drug Screen completed in last 360 days      Passed - Valid encounter within last 3 months    Recent Outpatient Visits           2 months ago Polypharmacy   [place] [facility] Duanne Butler DASEN, MD   4 months ago Seasonal allergies   [place] [facility] Duanne, Butler DASEN, MD   4 months ago Psoriatic arthritis [facility])   [place] [facility] Duanne Butler DASEN, MD   5 months ago High blood sugar   [place] [facility] Duanne Butler DASEN, MD   7 months ago Cellulitis of ankle   [place] [facility] Aletha Bene, MD       Future Appointments             In 4 months Cheryl Waddell CHRISTELLA RIGGERS [place] [facility] - A Dept Of [place]

## 2024-05-01 ENCOUNTER — Ambulatory Visit: Admitting: Family Medicine

## 2024-05-01 ENCOUNTER — Encounter: Payer: Self-pay | Admitting: Family Medicine

## 2024-05-01 VITALS — BP 130/76 | HR 91 | Temp 97.6°F | Ht 63.5 in | Wt 194.0 lb

## 2024-05-01 DIAGNOSIS — I10 Essential (primary) hypertension: Secondary | ICD-10-CM

## 2024-05-01 MED ORDER — ZEPBOUND 15 MG/0.5ML ~~LOC~~ SOLN: 15.0000 mg | SUBCUTANEOUS | 11 refills | Status: AC

## 2024-05-01 NOTE — Progress Notes (Signed)
 Subjective:    Patient ID: Janet Peterson, female    DOB: 1951-04-03, 73 y.o.   MRN: [id number]  HPI Wt Readings from Last 3 Encounters:  05/01/24 194 lb (88 kg)  04/05/24 192 lb 9.6 oz (87.4 kg)  02/25/24 195 lb (88.5 kg)   Patient has been on GLP-1 therapy for 2 years.  Prior to starting she weighed 203 pounds.  At her lowest she has been 192 pounds.  Over the last 2 years she was first on semaglutide .  She is currently on Zepbound  10 mg subcu weekly.  She wants to increase the dose further.  She denies any nausea or vomiting.  She denies any abdominal pain.  I explained to her that there is 12.5 mg and 15 mg.  She wants to push the dose to its maximum amount.  She is paying almost $500 a month for the medication and she wants to try to get the maximum benefit of possible.  I explained to her that it potentially could cause significant nausea however she is willing to try this.  She also has hypertension but ever since she started taking amlodipine  10 mg a day, her blood pressure has been well-controlled at 130/76.  She had a CMP recently that monitored her electrolytes in November and aside from some mild hyponatremia which is a chronic problem for this patient everything was normal. Past Medical History:  Diagnosis Date   Anemia    Ankylosing spondylitis (HCC)    Anxiety and depression    Cancer (HCC)    basal cell skin cancer   Depression    Dyspnea    Fibromyalgia    GERD (gastroesophageal reflux disease)    Hiatal hernia    per patient, dx by GI    History of basal cell carcinoma excision    FACE, 1992  &  1996   History of hiatal hernia    History of thrush    Hyperlipidemia    Hypertension    Insomnia    Left breast mass    OCD (obsessive compulsive disorder)    Osteopenia    PONV (postoperative nausea and vomiting)    Psoriatic arthritis (HCC)    PVC (premature ventricular contraction)    Restrictive lung disease    Rheumatoid arthritis (HCC)    Tardive dyskinesia     Past Surgical History:  Procedure Laterality Date   BIOPSY  09/29/2019   Procedure: BIOPSY;  Surgeon: Saintclair Jasper, MD;  Location: [facility];  Service: Gastroenterology;;   BREAST EXCISIONAL BIOPSY Right    BREAST EXCISIONAL BIOPSY Left 05/04/2018   BREAST LUMPECTOMY WITH RADIOACTIVE SEED LOCALIZATION Left 05/04/2018   Procedure: LEFT BREAST LUMPECTOMY WITH RADIOACTIVE SEED LOCALIZATION AND LEFT BREAST NIPPLE BIOPSY;  Surgeon: Curvin Deward MOULD, MD;  Location: [facility];  Service: General;  Laterality: Left;   BREAST SURGERY  05/25/1973   lumpectomy-- benign   BUNIONECTOMY  05/25/1989   CATARACT EXTRACTION W/ INTRAOCULAR LENS  IMPLANT, BILATERAL  05/25/1998   CERVICAL FUSION  07/24/2011   C5 -- C7   CESAREAN SECTION  05/26/1983   COLONOSCOPY WITH PROPOFOL  N/A 09/29/2019   Procedure: COLONOSCOPY WITH PROPOFOL ;  Surgeon: Saintclair Jasper, MD;  Location: [facility];  Service: Gastroenterology;  Laterality: N/A;   DISTAL INTERPHALANGEAL JOINT FUSION Right 03/14/2015   Procedure: RIGHT LONG FINGER DISTAL INTERPHALANGEAL JOINT ARTHRODESIS;  Surgeon: Prentice Pagan, MD;  Location: [facility];  Service: Orthopedics;  Laterality: Right;   ESOPHAGOGASTRODUODENOSCOPY (EGD)  WITH PROPOFOL  N/A 09/29/2019   Procedure: ESOPHAGOGASTRODUODENOSCOPY (EGD) WITH PROPOFOL ;  Surgeon: Saintclair Jasper, MD;  Location: [facility];  Service: Gastroenterology;  Laterality: N/A;   EYE SURGERY  05/25/1993   rk (laser surgery), semi cornea transplant, detacted retina,  fluid removal   HYSTEROSCOPY WITH D & C N/A 12/09/2020   Procedure: DILATATION AND CURETTAGE /HYSTEROSCOPY;  Surgeon: Storm Setter, DO;  Location: [facility];  Service: Gynecology;  Laterality: N/A;   KNEE ARTHROSCOPY Left 03/03/2004   POLYPECTOMY  09/29/2019   Procedure: POLYPECTOMY;  Surgeon: Saintclair Jasper, MD;  Location: [facility];  Service: Gastroenterology;;   POSTERIOR VITRECTOMY RIGHT EYE AND LASER    10/27/1999   right foot surgery      SHOULDER SURGERY Right 05/26/1995   SKIN BIOPSY  05/2022   lower stomach   SPINAL FIXATION SURGERY W/ IMPLANT  2013 rod #1//  2014  rod 2   S1 -- T10  (rod #1)//   S1 -- T4 (rod #2)   THORACIC FUSION  03/13/2013   REMOVAL HARDWARE/  BONE GRAFT FUSION T10//  REVISION OF RODS   TOTAL KNEE ARTHROPLASTY Left 12/14/2005   TOTAL KNEE ARTHROPLASTY Right 08/20/2023   Procedure: ARTHROPLASTY, KNEE, TOTAL;  Surgeon: Kay Kemps, MD;  Location: [facility];  Service: Orthopedics;  Laterality: Right;   UVULOPALATOPHARYNGOPLASTY  04/26/2006   w/  TONSILLECTOMY/  TURBINATE REDUCTIONS/  BILATERAL ANTERIOR ETHMOIDECTOMY   Current Outpatient Medications on File Prior to Visit  Medication Sig Dispense Refill   amLODipine  (NORVASC ) 5 MG tablet Take 1 tablet (5 mg total) by mouth daily. 90 tablet 1   Budeson-Glycopyrrol-Formoterol  (BREZTRI  AEROSPHERE) 160-9-4.8 MCG/ACT AERO Inhale 2 puffs into the lungs in the morning and at bedtime. 3 each 3   buPROPion  (WELLBUTRIN  XL) 300 MG 24 hr tablet Take 300 mg by mouth every morning.  1   clonazePAM  (KLONOPIN ) 1 MG tablet TAKE 1 TABLET BY MOUTH EVERY DAY AS NEEDED FOR ANXIETY 30 tablet 1   cyclobenzaprine  (FLEXERIL ) 10 MG tablet TAKE 1 TABLET BY MOUTH THREE TIMES A DAY AS NEEDED FOR MUSCLE SPASMS (Patient taking differently: PRN) 30 tablet 0   dexlansoprazole  (DEXILANT ) 60 MG capsule Take 1 capsule (60 mg total) by mouth daily. 90 capsule 3   doxepin  (SINEQUAN ) 50 MG capsule Take 100 mg by mouth at bedtime.     Evolocumab  (REPATHA  SURECLICK) 140 MG/ML SOAJ Inject 140 mg into the skin every 14 (fourteen) days. 2 mL 11   ezetimibe  (ZETIA ) 10 MG tablet TAKE 1 TABLET BY MOUTH EVERY DAY 90 tablet 2   FLUoxetine  (PROZAC ) 20 MG capsule Take 60 mg by mouth at bedtime.     fluticasone  (FLONASE ) 50 MCG/ACT nasal spray Place 2 sprays into both nostrils daily as needed for allergies. (Patient taking differently: Place 2 sprays into both nostrils  daily as needed for allergies. Using everday) 16 g 11   leflunomide  (ARAVA ) 20 MG tablet TAKE 1 TABLET BY MOUTH EVERY DAY 90 tablet 0   LINZESS  72 MCG capsule TAKE 1 CAPSULE BY MOUTH DAILY BEFORE BREAKFAST. 90 capsule 3   methocarbamol  (ROBAXIN ) 500 MG tablet Take 1 tablet (500 mg total) by mouth every 8 (eight) hours as needed for muscle spasms. 40 tablet 1   metoprolol  succinate (TOPROL -XL) 50 MG 24 hr tablet TAKE 1 TABLET BY MOUTH EVERY DAY WITH A MEAL 90 tablet 0   montelukast  (SINGULAIR ) 10 MG tablet Take 1 tablet (10 mg total) by mouth at bedtime. 30 tablet 11  ondansetron  (ZOFRAN ) 4 MG tablet Take 1 tablet (4 mg total) by mouth 4 (four) times daily as needed for nausea. 90 tablet 1   oxyCODONE -acetaminophen  (PERCOCET) 5-325 MG tablet Take 1 tablet by mouth every 8 (eight) hours as needed for severe pain (pain score 7-10). Ok to fill after 01/01/23 60 tablet 0   spironolactone  (ALDACTONE ) 25 MG tablet Take 25 mg by mouth daily.     tirzepatide  (ZEPBOUND ) 10 MG/0.5ML injection vial Inject 10 mg into the skin once a week. 2 mL 5   valbenazine  (INGREZZA ) 80 MG capsule Take 1 capsule (80 mg total) by mouth daily. 90 capsule 3   valsartan  (DIOVAN ) 320 MG tablet Take 1 tablet (320 mg total) by mouth daily. 90 tablet 3   White Petrolatum-Mineral Oil (REFRESH P.M. OP) Place 1 drop into both eyes every 6 (six) hours as needed (dry eyes).     clotrimazole  (LOTRIMIN ) 1 % cream APPLY TO AFFECTED AREA TWICE A DAY (Patient not taking: Reported on 05/01/2024) 30 g 0   doxazosin  (CARDURA ) 4 MG tablet TAKE 1 TABLET BY MOUTH EVERY DAY (Patient not taking: Reported on 04/05/2024) 90 tablet 1   Ferrous Sulfate (IRON PO) Take by mouth. (Patient not taking: Reported on 04/05/2024)     No current facility-administered medications on file prior to visit.   Allergies  Allergen Reactions   Latex Rash    Mouth sores   Amlodipine  Besy-Benazepril Hcl Cough    No angioedema   Amoxicillin-Pot Clavulanate Diarrhea and  Nausea And Vomiting   Bextra [Valdecoxib] Diarrhea and Nausea And Vomiting    Acid reflux   Hydrochlorothiazide      Low sodium   Lipitor [Atorvastatin Calcium] Other (See Comments)    MYALGIAS   Molds & Smuts    Spironolactone      Hyponatremia   Sulfa Antibiotics Nausea And Vomiting    Cramping and stomach hurts   Zocor [Simvastatin] Other (See Comments)    MYALGIA   Chlorhexidine  Rash    Rash all over body. Should not take topical or mouthwash   Social History   Socioeconomic History   Marital status: Married    Spouse name: Daryl   Number of children: Not on file   Years of education: Not on file   Highest education level: Bachelor's degree (e.g., BA, AB, BS)  Occupational History    Comment: retired  Tobacco Use   Smoking status: Former    Current packs/day: 0.00    Average packs/day: 0.5 packs/day for 10.0 years (5.0 ttl pk-yrs)    Types: Cigarettes    Start date: 03/06/1985    Quit date: 03/07/1995    Years since quitting: 29.1    Passive exposure: Never   Smokeless tobacco: Never  Vaping Use   Vaping status: Never Used  Substance and Sexual Activity   Alcohol use: Yes    Comment: rare   Drug use: Never   Sexual activity: Not Currently  Other Topics Concern   Not on file  Social History Narrative   Lives with husband   Social Drivers of Health   Financial Resource Strain: Low Risk  (08/19/2023)   Overall Financial Resource Strain (CARDIA)    Difficulty of Paying Living Expenses: Not hard at all  Food Insecurity: No Food Insecurity (08/20/2023)   Hunger Vital Sign    Worried About Running Out of Food in the Last Year: Never true    Ran Out of Food in the Last Year: Never true  Transportation Needs:  No Transportation Needs (08/20/2023)   PRAPARE - Administrator, Civil Service (Medical): No    Lack of Transportation (Non-Medical): No  Physical Activity: Inactive (08/19/2023)   Exercise Vital Sign    Days of Exercise per Week: 0 days     Minutes of Exercise per Session: 0 min  Stress: Stress Concern Present (08/19/2023)   [employer] of Occupational Health - Occupational Stress Questionnaire    Feeling of Stress : To some extent  Social Connections: Socially Integrated (08/20/2023)   Social Connection and Isolation Panel    Frequency of Communication with Friends and Family: Twice a week    Frequency of Social Gatherings with Friends and Family: Once a week    Attends Religious Services: More than 4 times per year    Active Member of [employer] or Organizations: No    Attends Engineer, Structural: More than 4 times per year    Marital Status: Married  Catering Manager Violence: Not At Risk (08/20/2023)   Humiliation, Afraid, Rape, and Kick questionnaire    Fear of Current or Ex-Partner: No    Emotionally Abused: No    Physically Abused: No    Sexually Abused: No    Review of Systems  All other systems reviewed and are negative.      Objective:   Physical Exam Vitals reviewed.  Constitutional:      Appearance: Normal appearance.  Cardiovascular:     Rate and Rhythm: Normal rate and regular rhythm.     Heart sounds: Normal heart sounds.  Pulmonary:     Effort: Pulmonary effort is normal.     Breath sounds: Normal breath sounds.  Skin:    Findings: Rash is not crusting, papular, pustular or vesicular.  Neurological:     Mental Status: She is alert.           Assessment & Plan:  Primary hypertension I am very happy with her blood pressure today.  We will increase Zepbound  to 15 mg subcu weekly.  I did caution the patient about possible nausea.  Next year when semaglutide  release is an oral formulation of Wegovy , we will transition the patient to this for cost reasons.

## 2024-05-08 LAB — CBC WITH DIFFERENTIAL/PLATELET
Absolute Lymphocytes: 1800 {cells}/uL (ref 850–3900)
Absolute Monocytes: 592 {cells}/uL (ref 200–950)
Basophils Absolute: 61 {cells}/uL (ref 0–200)
Basophils Relative: 1 %
Eosinophils Absolute: 128 {cells}/uL (ref 15–500)
Eosinophils Relative: 2.1 %
HCT: 36.6 % (ref 35.0–45.0)
Hemoglobin: 11.7 g/dL (ref 11.7–15.5)
MCH: 31 pg (ref 27.0–33.0)
MCHC: 32 g/dL (ref 32.0–36.0)
MCV: 96.8 fL (ref 80.0–100.0)
MPV: 10.8 fL (ref 7.5–12.5)
Monocytes Relative: 9.7 %
Neutro Abs: 3520 {cells}/uL (ref 1500–7800)
Neutrophils Relative %: 57.7 %
Platelets: 271 Thousand/uL (ref 140–400)
RBC: 3.78 Million/uL — ABNORMAL LOW (ref 3.80–5.10)
RDW: 13.2 % (ref 11.0–15.0)
Total Lymphocyte: 29.5 %
WBC: 6.1 Thousand/uL (ref 3.8–10.8)

## 2024-05-08 LAB — COMPREHENSIVE METABOLIC PANEL WITH GFR
AG Ratio: 1.9 (calc) (ref 1.0–2.5)
ALT: 8 U/L (ref 6–29)
AST: 15 U/L (ref 10–35)
Albumin: 4 g/dL (ref 3.6–5.1)
Alkaline phosphatase (APISO): 104 U/L (ref 37–153)
BUN: 11 mg/dL (ref 7–25)
CO2: 28 mmol/L (ref 20–32)
Calcium: 10.1 mg/dL (ref 8.6–10.4)
Chloride: 100 mmol/L (ref 98–110)
Creat: 0.8 mg/dL (ref 0.60–1.00)
Globulin: 2.1 g/dL (ref 1.9–3.7)
Glucose, Bld: 98 mg/dL (ref 65–99)
Potassium: 4.9 mmol/L (ref 3.5–5.3)
Sodium: 134 mmol/L — ABNORMAL LOW (ref 135–146)
Total Bilirubin: 0.3 mg/dL (ref 0.2–1.2)
Total Protein: 6.1 g/dL (ref 6.1–8.1)
eGFR: 77 mL/min/1.73m2 (ref >=60)

## 2024-05-08 LAB — TEST AUTHORIZATION

## 2024-05-08 LAB — HEMOGLOBIN A1C

## 2024-05-10 ENCOUNTER — Ambulatory Visit: Admitting: Podiatry

## 2024-05-10 DIAGNOSIS — L853 Xerosis cutis: Secondary | ICD-10-CM

## 2024-05-10 DIAGNOSIS — Q666 Other congenital valgus deformities of feet: Secondary | ICD-10-CM

## 2024-05-10 MED ORDER — AMMONIUM LACTATE 12 % EX LOTN: 1.0000 | TOPICAL_LOTION | CUTANEOUS | 0 refills | Status: AC | PRN

## 2024-05-10 NOTE — Progress Notes (Signed)
 Subjective:  Patient ID: Janet Peterson, female    DOB: [birth year],  MRN: [id number]  Chief Complaint  Patient presents with   Foot Pain    Left foot side of heel wound     73 y.o. female presents with the above complaint.  Patient presents with left heel abrasion likely due to the gait mechanics on the way she is walking.  Is causing some discomfort to the left lateral aspect of the heel.  There is no breakdown of the skin.  Dry xerotic skin noted.  She wanted get it evaluated she currently does not wear any orthotics denies any other acute complaints.  Would like to discuss treatment options for this   Review of Systems: Negative except as noted in the HPI. Denies N/V/F/Ch.  Past Medical History:  Diagnosis Date   Anemia    Ankylosing spondylitis (HCC)    Anxiety and depression    Cancer (HCC)    basal cell skin cancer   Depression    Dyspnea    Fibromyalgia    GERD (gastroesophageal reflux disease)    Hiatal hernia    per patient, dx by GI    History of basal cell carcinoma excision    FACE, 1992  &  1996   History of hiatal hernia    History of thrush    Hyperlipidemia    Hypertension    Insomnia    Left breast mass    OCD (obsessive compulsive disorder)    Osteopenia    PONV (postoperative nausea and vomiting)    Psoriatic arthritis (HCC)    PVC (premature ventricular contraction)    Restrictive lung disease    Rheumatoid arthritis (HCC)    Tardive dyskinesia    Current Medications[1]  Tobacco Use History[2]  Allergies[3] Objective:  There were no vitals filed for this visit. There is no height or weight on file to calculate BMI. Constitutional Well developed. Well nourished.  Vascular Dorsalis pedis pulses palpable bilaterally. Posterior tibial pulses palpable bilaterally. Capillary refill normal to all digits.  No cyanosis or clubbing noted. Pedal hair growth normal.  Neurologic Normal speech. Oriented to person, place, and time. Epicritic sensation  to light touch grossly present bilaterally.  Dermatologic Left heel xerosis noted with abrasion of the.  No ulceration noted.  No breakdown noted.  No clinical signs of infection noted.  Pes planovalgus foot structure noted  Orthopedic: Normal joint ROM without pain or crepitus bilaterally. No visible deformities. No bony tenderness.   Radiographs: None Assessment:   1. Xerosis of skin   2. Pes planovalgus    Plan:  Patient was evaluated and treated and all questions answered.  Xerosis skin without breakdown of underlying skin - All questions or concerns were discussed with the patient excessive detail she will benefit from ammonium lactate  and encouraged her to apply twice a day she states understanding.  This is likely due to the gait mechanics of the way she is walking.  Encouraged her to make shoe gear modification as well as orthotics she states understanding like to proceed with a  Pes planovalgus/foot deformity -I explained to patient the etiology of pes planovalgus and relationship with heel pain/arch pain and various treatment options were discussed.  Given patient foot structure in the setting of heel pain/arch pain I believe patient will benefit from custom-made orthotics to help control the hindfoot motion support the arch of the foot and take the stress away from arches.  Patient agrees with the plan like  to proceed with orthotics -Patient was casted for orthotics   No follow-ups on file.    [1]  Current Outpatient Medications:    ammonium lactate  (AMLACTIN DAILY) 12 % lotion, Apply 1 Application topically as needed., Disp: 400 g, Rfl: 0   amLODipine  (NORVASC ) 5 MG tablet, Take 1 tablet (5 mg total) by mouth daily., Disp: 90 tablet, Rfl: 1   Budeson-Glycopyrrol-Formoterol  (BREZTRI  AEROSPHERE) 160-9-4.8 MCG/ACT AERO, Inhale 2 puffs into the lungs in the morning and at bedtime., Disp: 3 each, Rfl: 3   buPROPion  (WELLBUTRIN  XL) 300 MG 24 hr tablet, Take 300 mg by mouth every  morning., Disp: , Rfl: 1   clonazePAM  (KLONOPIN ) 1 MG tablet, TAKE 1 TABLET BY MOUTH EVERY DAY AS NEEDED FOR ANXIETY, Disp: 30 tablet, Rfl: 1   clotrimazole  (LOTRIMIN ) 1 % cream, APPLY TO AFFECTED AREA TWICE A DAY (Patient not taking: Reported on 05/01/2024), Disp: 30 g, Rfl: 0   cyclobenzaprine  (FLEXERIL ) 10 MG tablet, TAKE 1 TABLET BY MOUTH THREE TIMES A DAY AS NEEDED FOR MUSCLE SPASMS (Patient taking differently: PRN), Disp: 30 tablet, Rfl: 0   dexlansoprazole  (DEXILANT ) 60 MG capsule, Take 1 capsule (60 mg total) by mouth daily., Disp: 90 capsule, Rfl: 3   doxepin  (SINEQUAN ) 50 MG capsule, Take 100 mg by mouth at bedtime., Disp: , Rfl:    Evolocumab  (REPATHA  SURECLICK) 140 MG/ML SOAJ, Inject 140 mg into the skin every 14 (fourteen) days., Disp: 2 mL, Rfl: 11   ezetimibe  (ZETIA ) 10 MG tablet, TAKE 1 TABLET BY MOUTH EVERY DAY, Disp: 90 tablet, Rfl: 2   Ferrous Sulfate (IRON PO), Take by mouth. (Patient not taking: Reported on 04/05/2024), Disp: , Rfl:    FLUoxetine  (PROZAC ) 20 MG capsule, Take 60 mg by mouth at bedtime., Disp: , Rfl:    fluticasone  (FLONASE ) 50 MCG/ACT nasal spray, Place 2 sprays into both nostrils daily as needed for allergies. (Patient taking differently: Place 2 sprays into both nostrils daily as needed for allergies. Using everday), Disp: 16 g, Rfl: 11   leflunomide  (ARAVA ) 20 MG tablet, TAKE 1 TABLET BY MOUTH EVERY DAY, Disp: 90 tablet, Rfl: 0   LINZESS  72 MCG capsule, TAKE 1 CAPSULE BY MOUTH DAILY BEFORE BREAKFAST., Disp: 90 capsule, Rfl: 3   methocarbamol  (ROBAXIN ) 500 MG tablet, Take 1 tablet (500 mg total) by mouth every 8 (eight) hours as needed for muscle spasms., Disp: 40 tablet, Rfl: 1   metoprolol  succinate (TOPROL -XL) 50 MG 24 hr tablet, TAKE 1 TABLET BY MOUTH EVERY DAY WITH A MEAL, Disp: 90 tablet, Rfl: 0   montelukast  (SINGULAIR ) 10 MG tablet, Take 1 tablet (10 mg total) by mouth at bedtime., Disp: 30 tablet, Rfl: 11   ondansetron  (ZOFRAN ) 4 MG tablet, Take 1 tablet  (4 mg total) by mouth 4 (four) times daily as needed for nausea., Disp: 90 tablet, Rfl: 1   oxyCODONE -acetaminophen  (PERCOCET) 5-325 MG tablet, Take 1 tablet by mouth every 8 (eight) hours as needed for severe pain (pain score 7-10). Ok to fill after 01/01/23, Disp: 60 tablet, Rfl: 0   spironolactone  (ALDACTONE ) 25 MG tablet, Take 25 mg by mouth daily., Disp: , Rfl:    Tirzepatide -Weight Management (ZEPBOUND ) 15 MG/0.5ML SOLN, Inject 15 mg into the skin once a week., Disp: 2 mL, Rfl: 11   valbenazine  (INGREZZA ) 80 MG capsule, Take 1 capsule (80 mg total) by mouth daily., Disp: 90 capsule, Rfl: 3   valsartan  (DIOVAN ) 320 MG tablet, Take 1 tablet (320 mg total) by mouth  daily., Disp: 90 tablet, Rfl: 3   White Petrolatum-Mineral Oil (REFRESH P.M. OP), Place 1 drop into both eyes every 6 (six) hours as needed (dry eyes)., Disp: , Rfl:  [2]  Social History Tobacco Use  Smoking Status Former   Current packs/day: 0.00   Average packs/day: 0.5 packs/day for 10.0 years (5.0 ttl pk-yrs)   Types: Cigarettes   Start date: 03/06/1985   Quit date: 03/07/1995   Years since quitting: 29.1   Passive exposure: Never  Smokeless Tobacco Never  [3]  Allergies Allergen Reactions   Latex Rash    Mouth sores   Amlodipine  Besy-Benazepril Hcl Cough    No angioedema   Amoxicillin-Pot Clavulanate Diarrhea and Nausea And Vomiting   Bextra [Valdecoxib] Diarrhea and Nausea And Vomiting    Acid reflux   Hydrochlorothiazide      Low sodium   Lipitor [Atorvastatin Calcium] Other (See Comments)    MYALGIAS   Molds & Smuts    Spironolactone      Hyponatremia   Sulfa Antibiotics Nausea And Vomiting    Cramping and stomach hurts   Zocor [Simvastatin] Other (See Comments)    MYALGIA   Chlorhexidine  Rash    Rash all over body. Should not take topical or mouthwash

## 2024-05-11 ENCOUNTER — Telehealth: Payer: Self-pay | Admitting: Pharmacy Technician

## 2024-05-11 ENCOUNTER — Other Ambulatory Visit (HOSPITAL_COMMUNITY): Payer: Self-pay

## 2024-05-11 NOTE — Telephone Encounter (Signed)
 Pharmacy Patient Advocate Encounter   Received notification from Onbase that prior authorization for INGREZZA  80MG  capsules is due for renewal.   Insurance verification completed.   The patient is insured through [facility].  Action: PA required; PA started via CoverMyMeds. KEY [id number] . Waiting for clinical questions to populate.

## 2024-05-12 NOTE — Telephone Encounter (Signed)
 Pharmacy Patient Advocate Encounter  Received notification from [employer] MEDICARE that Prior Authorization for INGREZZA  80MG  capsules has been DENIED.  Full denial letter will be uploaded to the media tab. See denial reason below.    PA #/Case ID/Reference #: [id number]  **Ingrezza  need to billed to her Medicare Part B**

## 2024-05-19 ENCOUNTER — Other Ambulatory Visit: Payer: Self-pay | Admitting: Family Medicine

## 2024-05-29 ENCOUNTER — Other Ambulatory Visit: Payer: Self-pay | Admitting: Family Medicine

## 2024-05-29 ENCOUNTER — Telehealth: Payer: Self-pay

## 2024-05-29 NOTE — Telephone Encounter (Signed)
 Copied from CRM #[id number]. Topic: Clinical - Medication Refill >> May 29, 2024 12:37 PM Tysheama G wrote: Medication: oxyCODONE -acetaminophen  (PERCOCET) 5-325 MG tablet  Has the patient contacted their pharmacy? No (Agent: If no, request that the patient contact the pharmacy for the refill. If patient does not wish to contact the pharmacy document the reason why and proceed with request.) (Agent: If yes, when and what did the pharmacy advise?)  This is the patient's preferred pharmacy:  [id number] [place], [place] - [address] [place] [address] Phone: [phone] Fax: [phone]    Is this the correct pharmacy for this prescription? Yes If no, delete pharmacy and type the correct one.   Has the prescription been filled recently? No  Is the patient out of the medication? No  Has the patient been seen for an appointment in the last year OR does the patient have an upcoming appointment? Yes  Can we respond through MyChart? No  Agent: Please be advised that Rx refills may take up to 3 business days. We ask that you follow-up with your pharmacy.

## 2024-05-29 NOTE — Telephone Encounter (Signed)
 Copied from CRM [id number]. Topic: Clinical - Prescription Issue >> May 29, 2024 12:11 PM Darshell M wrote: Reason for CRM: Jonette calling to follow up on the [facility] appeal. Specialty Pharmacy [phone]

## 2024-05-30 ENCOUNTER — Telehealth: Payer: Self-pay | Admitting: Family Medicine

## 2024-05-30 ENCOUNTER — Other Ambulatory Visit: Payer: Self-pay | Admitting: Family Medicine

## 2024-05-30 MED ORDER — OXYCODONE-ACETAMINOPHEN 5-325 MG PO TABS: 1.0000 | ORAL_TABLET | Freq: Three times a day (TID) | ORAL | 0 refills | Status: DC | PRN

## 2024-05-30 NOTE — Telephone Encounter (Signed)
 Copied from CRM #[id number]. Topic: Clinical - Medication Refill >> May 29, 2024 12:37 PM Tysheama G wrote: Medication: oxyCODONE -acetaminophen  (PERCOCET) 5-325 MG tablet  Has the patient contacted their pharmacy? No (Agent: If no, request that the patient contact the pharmacy for the refill. If patient does not wish to contact the pharmacy document the reason why and proceed with request.) (Agent: If yes, when and what did the pharmacy advise?)  This is the patient's preferred pharmacy:  [id number] [place], [place] - [address] [place] [address] Phone: [phone] Fax: [phone]    Is this the correct pharmacy for this prescription? Yes If no, delete pharmacy and type the correct one.   Has the prescription been filled recently? No  Is the patient out of the medication? No  Has the patient been seen for an appointment in the last year OR does the patient have an upcoming appointment? Yes  Can we respond through MyChart? No  Agent: Please be advised that Rx refills may take up to 3 business days. We ask that you follow-up with your pharmacy. >> May 30, 2024  2:28 PM Hadassah PARAS wrote: Pt is calling to follow up on medication request. Pt would like this expedited this process.

## 2024-05-30 NOTE — Telephone Encounter (Signed)
 Requested medications are due for refill today.  yes  Requested medications are on the active medications list.  yes  Last refill. 04/25/2024 #60 0 rf  Future visit scheduled.   Yes - a wellness visit is scheduled.  Notes to clinic.  Refill not delegated.    Requested Prescriptions  Pending Prescriptions Disp Refills   oxyCODONE -acetaminophen  (PERCOCET) 5-325 MG tablet 60 tablet 0    Sig: Take 1 tablet by mouth every 8 (eight) hours as needed for severe pain (pain score 7-10). Ok to fill after 01/01/23     Not Delegated - Analgesics:  Opioid Agonist Combinations Failed - 05/30/2024  4:34 PM      Failed - This refill cannot be delegated      Failed - Urine Drug Screen completed in last 360 days      Passed - Valid encounter within last 3 months    Recent Outpatient Visits           4 weeks ago Primary hypertension   [facility] Duanne, Butler DASEN, MD   3 months ago Polypharmacy   [facility] Duanne Butler DASEN, MD   5 months ago Seasonal allergies   [facility] Duanne Butler DASEN, MD   5 months ago Psoriatic arthritis [facility])   [facility] Duanne Butler DASEN, MD   6 months ago High blood sugar   [facility] Pickard, Butler DASEN, MD       Future Appointments             In 3 months Cheryl Waddell CHRISTELLA RIGGERS [facility] - A Dept Of [place]

## 2024-06-01 ENCOUNTER — Other Ambulatory Visit (HOSPITAL_COMMUNITY): Payer: Self-pay

## 2024-06-01 NOTE — Telephone Encounter (Signed)
 Hi MJ, it looks like Janet Peterson filled her Ingrezza  80 mg capsules on 05/15/24 and the next time the insurance will pay again is 06/07/24 according to my test bill.

## 2024-06-02 NOTE — Telephone Encounter (Signed)
 You're welcome.SABRASABRA

## 2024-06-06 ENCOUNTER — Other Ambulatory Visit (HOSPITAL_COMMUNITY): Payer: Self-pay

## 2024-06-06 ENCOUNTER — Telehealth: Payer: Self-pay

## 2024-06-06 NOTE — Telephone Encounter (Signed)
 Hi MJ, I just got off the phone with [facility]. I spoke with [name] who explained that [name] has dual coverage so although we did get an initial denial they reworked the PA through her other medicare part D plan and it was approved with approval dates 05/25/2024 through 05/24/2025 and she also gave me the approval case number which is [id number] and nothing further needs to be done because she recently filled her Ingrezza  and the next refill date is 06/07/24.  Thanks, have a great evening!

## 2024-06-06 NOTE — Telephone Encounter (Signed)
 Copied from CRM #[id number]. Topic: Clinical - Medication Prior Auth >> Jun 06, 2024 12:19 PM Corin V wrote: Reason for CRM: Rockey with Cover my meds advised that original prior authorization for Ingrezza  was denied for patient last month. They are on formulary for medication for patient plan, but office can appeal. Using ID for Cover My meds may allow e-appeal: [id number] If there is appeal denial, patient is eligible for assistance program.

## 2024-06-07 ENCOUNTER — Telehealth: Payer: Self-pay

## 2024-06-07 NOTE — Telephone Encounter (Signed)
 You're welcome.SABRASABRA

## 2024-06-07 NOTE — Telephone Encounter (Signed)
 Copied from CRM [id number]. Topic: Clinical - Medication Question >> Jun 07, 2024  2:19 PM Viola F wrote: Reason for CRM: Patient wants prescription of a high dosage of the WEGOVY  medication [facility] - says this was discussed with Dr. Duanne

## 2024-06-08 ENCOUNTER — Other Ambulatory Visit: Payer: Self-pay | Admitting: Family Medicine

## 2024-06-09 ENCOUNTER — Telehealth: Payer: Self-pay | Admitting: Podiatry

## 2024-06-09 NOTE — Telephone Encounter (Signed)
 Ortho in [place], spoke with pt, appt 06/16/2024 in [place] office

## 2024-06-16 ENCOUNTER — Ambulatory Visit: Admitting: Podiatrist

## 2024-06-16 ENCOUNTER — Emergency Department (HOSPITAL_COMMUNITY)

## 2024-06-16 ENCOUNTER — Emergency Department (HOSPITAL_COMMUNITY)
Admission: EM | Admit: 2024-06-16 | Discharge: 2024-06-16 | Disposition: A | Discharge status: Home / Self Care | Attending: Emergency Medicine | Admitting: Emergency Medicine

## 2024-06-16 ENCOUNTER — Other Ambulatory Visit: Payer: Self-pay

## 2024-06-16 DIAGNOSIS — Z91040 Latex allergy status: Secondary | ICD-10-CM | POA: Insufficient documentation

## 2024-06-16 DIAGNOSIS — Z79899 Other long term (current) drug therapy: Secondary | ICD-10-CM | POA: Diagnosis not present

## 2024-06-16 DIAGNOSIS — L4050 Arthropathic psoriasis, unspecified: Secondary | ICD-10-CM | POA: Diagnosis not present

## 2024-06-16 DIAGNOSIS — S0003XA Contusion of scalp, initial encounter: Secondary | ICD-10-CM | POA: Insufficient documentation

## 2024-06-16 DIAGNOSIS — D649 Anemia, unspecified: Secondary | ICD-10-CM | POA: Insufficient documentation

## 2024-06-16 DIAGNOSIS — F429 Obsessive-compulsive disorder, unspecified: Secondary | ICD-10-CM | POA: Insufficient documentation

## 2024-06-16 DIAGNOSIS — Z981 Arthrodesis status: Secondary | ICD-10-CM | POA: Diagnosis not present

## 2024-06-16 DIAGNOSIS — W19XXXA Unspecified fall, initial encounter: Secondary | ICD-10-CM

## 2024-06-16 DIAGNOSIS — E871 Hypo-osmolality and hyponatremia: Secondary | ICD-10-CM | POA: Diagnosis not present

## 2024-06-16 DIAGNOSIS — M79642 Pain in left hand: Secondary | ICD-10-CM | POA: Diagnosis present

## 2024-06-16 DIAGNOSIS — M069 Rheumatoid arthritis, unspecified: Secondary | ICD-10-CM | POA: Insufficient documentation

## 2024-06-16 DIAGNOSIS — R0789 Other chest pain: Secondary | ICD-10-CM | POA: Diagnosis not present

## 2024-06-16 DIAGNOSIS — W108XXA Fall (on) (from) other stairs and steps, initial encounter: Secondary | ICD-10-CM | POA: Insufficient documentation

## 2024-06-16 DIAGNOSIS — S62625A Displaced fracture of medial phalanx of left ring finger, initial encounter for closed fracture: Secondary | ICD-10-CM | POA: Insufficient documentation

## 2024-06-16 DIAGNOSIS — E785 Hyperlipidemia, unspecified: Secondary | ICD-10-CM | POA: Diagnosis not present

## 2024-06-16 DIAGNOSIS — Y92094 Garage of other non-institutional residence as the place of occurrence of the external cause: Secondary | ICD-10-CM | POA: Insufficient documentation

## 2024-06-16 DIAGNOSIS — M25572 Pain in left ankle and joints of left foot: Secondary | ICD-10-CM | POA: Diagnosis not present

## 2024-06-16 DIAGNOSIS — G2401 Drug induced subacute dyskinesia: Secondary | ICD-10-CM | POA: Diagnosis not present

## 2024-06-16 DIAGNOSIS — I10 Essential (primary) hypertension: Secondary | ICD-10-CM | POA: Insufficient documentation

## 2024-06-16 DIAGNOSIS — S300XXA Contusion of lower back and pelvis, initial encounter: Secondary | ICD-10-CM | POA: Insufficient documentation

## 2024-06-16 DIAGNOSIS — D1779 Benign lipomatous neoplasm of other sites: Secondary | ICD-10-CM | POA: Diagnosis not present

## 2024-06-16 DIAGNOSIS — M5459 Other low back pain: Secondary | ICD-10-CM | POA: Diagnosis present

## 2024-06-16 DIAGNOSIS — K219 Gastro-esophageal reflux disease without esophagitis: Secondary | ICD-10-CM | POA: Diagnosis not present

## 2024-06-16 DIAGNOSIS — Q666 Other congenital valgus deformities of feet: Secondary | ICD-10-CM

## 2024-06-16 LAB — COMPREHENSIVE METABOLIC PANEL WITH GFR
ALT: 9 U/L (ref 0–44)
AST: 18 U/L (ref 15–41)
Albumin: 4 g/dL (ref 3.5–5.0)
Alkaline Phosphatase: 104 U/L (ref 38–126)
Anion gap: 8 (ref 5–15)
BUN: 10 mg/dL (ref 8–23)
CO2: 25 mmol/L (ref 22–32)
Calcium: 9.9 mg/dL (ref 8.9–10.3)
Chloride: 98 mmol/L (ref 98–111)
Creatinine, Ser: 0.88 mg/dL (ref 0.44–1.00)
GFR, Estimated: >60 mL/min
Glucose, Bld: 118 mg/dL — ABNORMAL HIGH (ref 70–99)
Potassium: 4.5 mmol/L (ref 3.5–5.1)
Sodium: 130 mmol/L — ABNORMAL LOW (ref 135–145)
Total Bilirubin: 0.3 mg/dL (ref 0.0–1.2)
Total Protein: 6.5 g/dL (ref 6.5–8.1)

## 2024-06-16 LAB — CBC WITH DIFFERENTIAL/PLATELET
Abs Immature Granulocytes: 0.07 K/uL (ref 0.00–0.07)
Basophils Absolute: 0 K/uL (ref 0.0–0.1)
Basophils Relative: 1 %
Eosinophils Absolute: 0.1 K/uL (ref 0.0–0.5)
Eosinophils Relative: 2 %
HCT: 34.8 % — ABNORMAL LOW (ref 36.0–46.0)
Hemoglobin: 11.4 g/dL — ABNORMAL LOW (ref 12.0–15.0)
Immature Granulocytes: 1 %
Lymphocytes Relative: 16 %
Lymphs Abs: 1.1 K/uL (ref 0.7–4.0)
MCH: 31.9 pg (ref 26.0–34.0)
MCHC: 32.8 g/dL (ref 30.0–36.0)
MCV: 97.5 fL (ref 80.0–100.0)
Monocytes Absolute: 0.7 K/uL (ref 0.1–1.0)
Monocytes Relative: 10 %
Neutro Abs: 5.1 K/uL (ref 1.7–7.7)
Neutrophils Relative %: 70 %
Platelets: 207 K/uL (ref 150–400)
RBC: 3.57 MIL/uL — ABNORMAL LOW (ref 3.87–5.11)
RDW: 13.2 % (ref 11.5–15.5)
WBC: 7.1 K/uL (ref 4.0–10.5)
nRBC: 0 % (ref 0.0–0.2)

## 2024-06-16 LAB — I-STAT CHEM 8, ED
BUN: 9 mg/dL (ref 8–23)
Calcium, Ion: 1.17 mmol/L (ref 1.15–1.40)
Chloride: 97 mmol/L — ABNORMAL LOW (ref 98–111)
Creatinine, Ser: 1 mg/dL (ref 0.44–1.00)
Glucose, Bld: 94 mg/dL (ref 70–99)
HCT: 35 % — ABNORMAL LOW (ref 36.0–46.0)
Hemoglobin: 11.9 g/dL — ABNORMAL LOW (ref 12.0–15.0)
Potassium: 4.4 mmol/L (ref 3.5–5.1)
Sodium: 132 mmol/L — ABNORMAL LOW (ref 135–145)
TCO2: 24 mmol/L (ref 22–32)

## 2024-06-16 LAB — I-STAT CG4 LACTIC ACID, ED: Lactic Acid, Venous: 0.9 mmol/L (ref 0.5–1.9)

## 2024-06-16 MED ORDER — MORPHINE SULFATE (PF) 4 MG/ML IV SOLN
4.0000 mg | Freq: Once | INTRAVENOUS | Status: AC
  Administered 2024-06-16: 4 mg via INTRAVENOUS
  Filled 2024-06-16: qty 1

## 2024-06-16 MED ORDER — OXYCODONE HCL 5 MG PO TABS: 5.0000 mg | ORAL_TABLET | Freq: Four times a day (QID) | ORAL | 0 refills | Status: DC | PRN

## 2024-06-16 MED ORDER — IOHEXOL 350 MG/ML SOLN
75.0000 mL | Freq: Once | INTRAVENOUS | Status: AC | PRN
  Administered 2024-06-16: 75 mL via INTRAVENOUS

## 2024-06-16 MED ORDER — ONDANSETRON HCL 4 MG/2ML IJ SOLN
4.0000 mg | Freq: Once | INTRAMUSCULAR | Status: AC
  Administered 2024-06-16: 4 mg via INTRAVENOUS
  Filled 2024-06-16: qty 2

## 2024-06-16 MED ORDER — SODIUM CHLORIDE 0.9 % IV BOLUS
125.0000 mL | Freq: Once | INTRAVENOUS | Status: AC
  Administered 2024-06-16: 125 mL via INTRAVENOUS

## 2024-06-16 MED ORDER — HYDROMORPHONE HCL 1 MG/ML IJ SOLN
0.5000 mg | Freq: Once | INTRAMUSCULAR | Status: AC
  Administered 2024-06-16: 0.5 mg via INTRAVENOUS
  Filled 2024-06-16: qty 1

## 2024-06-16 MED ORDER — OXYCODONE-ACETAMINOPHEN 5-325 MG PO TABS
2.0000 | ORAL_TABLET | Freq: Once | ORAL | Status: AC
  Administered 2024-06-16: 2 via ORAL
  Filled 2024-06-16: qty 2

## 2024-06-16 MED ORDER — METHOCARBAMOL 500 MG PO TABS: 500.0000 mg | ORAL_TABLET | Freq: Two times a day (BID) | ORAL | 0 refills | Status: AC

## 2024-06-16 NOTE — Progress Notes (Signed)
 ORTHOTIC DISPENSING:   Reason for Visit:         Fitting and Delivery of Custom Fabricated Foot Orthoses Patient Report:            Patient reports comfort and is satisfied with device.   OBJECTIVE DATA: Patient History / Diagnosis:    No change in pathology Provided Device:                     Functional foot orthoses   GOAL OF ORTHOSIS - Improve gait - Decrease energy expenditure - Improve Balance - Provide Triplanar stability of foot complex - Facilitate motion   ACTIONS PERFORMED Patient was fit with custom foot orthoses   Patient was provided with verbal and written instruction and demonstration regarding wear, care, proper fit, function, and use of the orthosis.    Patient was also provided with verbal instruction regarding how to report any failures or malfunctions of the orthosis and necessary follow up care. Patient was also instructed to contact our office regarding any change in status that may affect the function of the orthosis.   Patient demonstrated understanding of all instructions.  Willodean Leven, DPM

## 2024-06-16 NOTE — ED Triage Notes (Signed)
 Pt brought in by EMS. EMS reports they were called because  pt was Walking up stairs from garage when she  fell down 3 steps and hit head. Denies LOC. Hematoma to left posterior head. CO bilateral hip and back pain.Hx chronic back pain.100mcg of fentanyl  given by Ems  20G IV left AC  EMS vitals 132/72 86 98% RA

## 2024-06-16 NOTE — ED Provider Notes (Signed)
 " [place] [facility] AT [facility] Provider Note   CSN: [id number] Arrival date & time: 06/16/24  1601     Patient presents with: [place]   Janet Peterson is a 74 y.o. female.   Patient is a 74 year old female with a history of GERD, hypertension, OCD, rheumatoid arthritis, psoriatic arthritis, hyperlipidemia, anemia and tardive dyskinesia who is presenting today after a fall at home.  She was going from her garage into her kitchen and she had gone up the third step when her leg gave way causing her to fall backwards and land on the concrete floor in her garage.  She was not able to stand or get up after this event.  She denies any loss of consciousness but does report hitting the back of her head on the ground.  She is having head pain, neck pain but her biggest complaint is severe back pain.  She is having lumbar pain as well as some left-sided rib and flank pain.  She reports it is slightly painful to take a deep breath but denies any shortness of breath.  She also has complaints of pain in her left hand as well as pain in her left ankle.  She is able to move her legs and reports her hips do not necessarily hurt but it makes her back feel worse.  She has had prior surgeries in both her back and her neck.  She takes no anticoagulation.  She denies significant headache nausea or vomiting.  Prior to this she was feeling in her normal state of health.  She reports often her legs will go out and that is not unusual.  The history is provided by the patient, the EMS personnel and medical records.  Fall       Prior to Admission medications  Medication Sig Start Date End Date Taking? Authorizing Provider  methocarbamol  (ROBAXIN ) 500 MG tablet Take 1 tablet (500 mg total) by mouth 2 (two) times daily. 06/16/24  Yes Doretha Folks, MD  oxyCODONE  (ROXICODONE ) 5 MG immediate release tablet Take 1 tablet (5 mg total) by mouth every 6 (six) hours as needed for severe pain (pain score  7-10). 06/16/24  Yes Glenetta Kiger, Folks, MD  amLODipine  (NORVASC ) 5 MG tablet Take 1 tablet (5 mg total) by mouth daily. 04/14/24   Duanne Butler DASEN, MD  ammonium lactate  (AMLACTIN DAILY) 12 % lotion Apply 1 Application topically as needed. 05/10/24   Tobie Franky SQUIBB, DPM  Budeson-Glycopyrrol-Formoterol  (BREZTRI  AEROSPHERE) 160-9-4.8 MCG/ACT AERO Inhale 2 puffs into the lungs in the morning and at bedtime. 07/20/23 07/19/24  Mannam, Praveen, MD  buPROPion  (WELLBUTRIN  XL) 300 MG 24 hr tablet Take 300 mg by mouth every morning. 04/07/17   [provider]  clonazePAM  (KLONOPIN ) 1 MG tablet TAKE 1 TABLET BY MOUTH EVERY DAY AS NEEDED FOR ANXIETY 07/07/19   Duanne Butler DASEN, MD  clotrimazole  (LOTRIMIN ) 1 % cream APPLY TO AFFECTED AREA TWICE A DAY Patient not taking: Reported on 05/01/2024 10/07/23   Duanne Butler DASEN, MD  cyclobenzaprine  (FLEXERIL ) 10 MG tablet TAKE 1 TABLET BY MOUTH THREE TIMES A DAY AS NEEDED FOR MUSCLE SPASMS Patient taking differently: PRN 11/19/23   Duanne Butler DASEN, MD  dexlansoprazole  (DEXILANT ) 60 MG capsule Take 1 capsule (60 mg total) by mouth daily. 12/23/23   Duanne Butler DASEN, MD  doxepin  (SINEQUAN ) 50 MG capsule Take 100 mg by mouth at bedtime.    [provider]  Evolocumab  (REPATHA  SURECLICK) 140 MG/ML SOAJ Inject  140 mg into the skin every 14 (fourteen) days. 04/14/24   Duanne Butler DASEN, MD  ezetimibe  (ZETIA ) 10 MG tablet TAKE 1 TABLET BY MOUTH EVERY DAY 12/29/23   Duanne Butler DASEN, MD  Ferrous Sulfate (IRON PO) Take by mouth. Patient not taking: Reported on 04/05/2024    [provider]  FLUoxetine  (PROZAC ) 20 MG capsule Take 60 mg by mouth at bedtime. 09/10/21   [provider]  fluticasone  (FLONASE ) 50 MCG/ACT nasal spray Place 2 sprays into both nostrils daily as needed for allergies. Patient taking differently: Place 2 sprays into both nostrils daily as needed for allergies. Using everday 12/23/23   Duanne Butler DASEN, MD  leflunomide   (ARAVA ) 20 MG tablet TAKE 1 TABLET BY MOUTH EVERY DAY 12/06/23   Cheryl Waddell HERO, PA-C  LINZESS  72 MCG capsule TAKE 1 CAPSULE BY MOUTH DAILY BEFORE BREAKFAST. 04/21/23   Duanne Butler DASEN, MD  metoprolol  succinate (TOPROL -XL) 50 MG 24 hr tablet TAKE 1 TABLET BY MOUTH EVERY DAY WITH A MEAL 05/19/24   Duanne Butler DASEN, MD  montelukast  (SINGULAIR ) 10 MG tablet Take 1 tablet (10 mg total) by mouth at bedtime. 11/01/23   Mannam, Praveen, MD  ondansetron  (ZOFRAN ) 4 MG tablet Take 1 tablet (4 mg total) by mouth 4 (four) times daily as needed for nausea. 05/11/23   Duanne Butler DASEN, MD  oxyCODONE -acetaminophen  (PERCOCET) 5-325 MG tablet Take 1 tablet by mouth every 8 (eight) hours as needed for severe pain (pain score 7-10). Ok to fill after 01/01/23 05/30/24   Duanne Butler DASEN, MD  spironolactone  (ALDACTONE ) 25 MG tablet Take 25 mg by mouth daily. 09/17/23   [provider]  Tirzepatide -Weight Management (ZEPBOUND ) 15 MG/0.5ML SOLN Inject 15 mg into the skin once a week. 05/01/24   Duanne Butler DASEN, MD  valbenazine  (INGREZZA ) 80 MG capsule Take 1 capsule (80 mg total) by mouth daily. 03/20/24   Duanne Butler DASEN, MD  valsartan  (DIOVAN ) 320 MG tablet Take 1 tablet (320 mg total) by mouth daily. 07/20/23   Duanne Butler DASEN, MD  White Petrolatum-Mineral Oil (REFRESH P.M. OP) Place 1 drop into both eyes every 6 (six) hours as needed (dry eyes).    [provider]    Allergies: Latex, Amlodipine  besy-benazepril hcl, Amoxicillin-pot clavulanate, Bextra [valdecoxib], Hydrochlorothiazide , Lipitor [atorvastatin calcium], Molds & smuts, Spironolactone , Sulfa antibiotics, Zocor [simvastatin], and Chlorhexidine     Review of Systems  Updated Vital Signs BP (!) 140/90   Pulse 77   Temp (!) 97.5 F (36.4 C) (Oral)   Resp 16   Ht 5' 3 (1.6 m)   Wt 85.3 kg   SpO2 98%   BMI 33.30 kg/m   Physical Exam Vitals and nursing note reviewed.  Constitutional:      General: She is not in acute distress.     Appearance: She is well-developed.  HENT:     Head: Normocephalic.   Eyes:     Pupils: Pupils are equal, round, and reactive to light.  Cardiovascular:     Rate and Rhythm: Normal rate and regular rhythm.     Heart sounds: Normal heart sounds. No murmur heard.    No friction rub.  Pulmonary:     Effort: Pulmonary effort is normal.     Breath sounds: Normal breath sounds. No wheezing or rales.  Chest:     Chest wall: Tenderness present.  Abdominal:     General: Bowel sounds are normal. There is no distension.     Palpations: Abdomen  is soft.     Tenderness: There is no abdominal tenderness. There is no guarding or rebound.  Musculoskeletal:        General: Tenderness present.       Hands:     Cervical back: Normal range of motion and neck supple. Tenderness present.     Lumbar back: Tenderness present. Decreased range of motion.       Back:     Left ankle: Tenderness present over the lateral malleolus. No medial malleolus or proximal fibula tenderness. Normal range of motion.     Comments: No edema  Skin:    General: Skin is warm and dry.     Findings: No rash.  Neurological:     Mental Status: She is alert and oriented to person, place, and time. Mental status is at baseline.     Cranial Nerves: No cranial nerve deficit.  Psychiatric:        Mood and Affect: Mood normal.        Behavior: Behavior normal.     (all labs ordered are listed, but only abnormal results are displayed) Labs Reviewed  CBC WITH DIFFERENTIAL/PLATELET - Abnormal; Notable for the following components:      Result Value   RBC 3.57 (*)    Hemoglobin 11.4 (*)    HCT 34.8 (*)    All other components within normal limits  COMPREHENSIVE METABOLIC PANEL WITH GFR - Abnormal; Notable for the following components:   Sodium 130 (*)    Glucose, Bld 118 (*)    All other components within normal limits  I-STAT CHEM 8, ED - Abnormal; Notable for the following components:   Sodium 132 (*)    Chloride 97  (*)    Hemoglobin 11.9 (*)    HCT 35.0 (*)    All other components within normal limits  URINALYSIS, ROUTINE W REFLEX MICROSCOPIC  I-STAT CG4 LACTIC ACID, ED    EKG: None  Radiology: CT T-SPINE NO CHARGE Result Date: 06/16/2024 EXAM: CT THORACIC SPINE WITHOUT CONTRAST 06/16/2024 05:50:55 PM TECHNIQUE: CT of the thoracic spine was performed without the administration of intravenous contrast. Multiplanar reformatted images are provided for review. Automated exposure control, iterative reconstruction, and/or weight based adjustment of the mA/kV was utilized to reduce the radiation dose to as low as reasonably achievable. COMPARISON: None available. CLINICAL HISTORY: FINDINGS: BONES AND ALIGNMENT: T4 to S1 and iliac bone surgical hardware fusion. No CT finding to suggest surgical hardware complication. Diffusely decreased bone density. Redemonstration of chronic complete burst fracture of the T10 level with 8 mm retropulsion into the central canal status post laminectomy. Chronic stable T4 anterior wedge compression fracture. Normal alignment. No acute fracture or suspicious bone lesion. DEGENERATIVE CHANGES: Multilevel mild degenerative changes of the spine. C3-C4 severe degenerative changes. Multilevel intervertebral disc space narrowing. SOFT TISSUES: No acute abnormality. IMPRESSION: 1. No acute findings. No CT evidence of complication of the thoracolumbar fusion hardware. 2. Other, non-acute and/or normal findings as above. Electronically signed by: Morgane Naveau MD 06/16/2024 06:33 PM EST RP Workstation: [id number]   CT L-SPINE NO CHARGE Result Date: 06/16/2024 EXAM: CT OF THE LUMBAR SPINE WITHOUT CONTRAST 06/16/2024 05:50:55 PM TECHNIQUE: CT of the lumbar spine was performed without the administration of intravenous contrast. Multiplanar reformatted images are provided for review. Automated exposure control, iterative reconstruction, and/or weight based adjustment of the mA/kV was utilized to  reduce the radiation dose to as low as reasonably achievable. COMPARISON: None available. CLINICAL HISTORY: FINDINGS: BONES AND ALIGNMENT: Normal  vertebral body heights. T4 to S1 and iliac bone surgical hardware fusion. No CT finding to suggest surgical hardware complication. Diffusely decreased bone density. No acute fracture or suspicious bone lesion. Normal alignment. DEGENERATIVE CHANGES: No significant degenerative changes. SOFT TISSUES: No acute abnormality. IMPRESSION: 1. No acute findings. 2. T4 to S1 and iliac bone surgical hardware fusion without CT evidence of hardware complication. Electronically signed by: Morgane Naveau MD 06/16/2024 06:27 PM EST RP Workstation: [id number]   CT CHEST ABDOMEN PELVIS W CONTRAST Result Date: 06/16/2024 EXAM: CT CHEST WITH CONTRAST 06/16/2024 05:50:55 PM TECHNIQUE: CT of the chest was performed with the administration of 75 mL of iohexol  (OMNIPAQUE ) 350 MG/ML injection. Multiplanar reformatted images are provided for review. Automated exposure control, iterative reconstruction, and/or weight based adjustment of the mA/kV was utilized to reduce the radiation dose to as low as reasonably achievable. COMPARISON: None available. CLINICAL HISTORY: Polytrauma, blunt; fall with back and rib/flank pain. FINDINGS: MEDIASTINUM: Borderline prominent heart size. At least 3-vessel coronary artery calcifications. Pericardium is unremarkable. The central airways are clear. The thoracic aorta is normal in caliber. The main pulmonary artery is normal in caliber. Moderate atherosclerotic plaque of the aorta. No pneumomediastinum. No anterior mediastinal hematoma. LYMPH NODES: No mediastinal, hilar or axillary lymphadenopathy. LUNGS AND PLEURA: Bilateral dependent atelectasis. Linear atelectasis versus scarring within the right middle lobe and right upper lobe. No pleural effusion or pneumothorax. SOFT TISSUES/BONES: Old right posterior rib fractures. No acute displaced fracture or  dislocation of the visualized bones. Right shoulder degenerative changes. No soft tissue hematoma. No pelvic diastasis. UPPER ABDOMEN: Heterogeneous fat density left adrenal gland nodule that measures up to 2.8 cm. No right adrenal gland nodule. No small or large bowel thickening or dilatation. The appendix is unremarkable. Colonic diverticulosis. The uterus is unremarkable. No adnexal mass. Limited images of the upper abdomen demonstrates no acute abnormality. IMPRESSION: 1. No acute traumatic findings. 2. 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 3. Other, non-acute and/or normal findings as above. Electronically signed by: Morgane Naveau MD 06/16/2024 06:25 PM EST RP Workstation: [id number]   CT CERVICAL SPINE WO CONTRAST Result Date: 06/16/2024 EXAM: CT CERVICAL SPINE WITHOUT CONTRAST 06/16/2024 05:50:55 PM TECHNIQUE: CT of the cervical spine was performed without the administration of intravenous contrast. Multiplanar reformatted images are provided for review. Automated exposure control, iterative reconstruction, and/or weight based adjustment of the mA/kV was utilized to reduce the radiation dose to as low as reasonably achievable. COMPARISON: None available. CLINICAL HISTORY: Polytrauma, blunt. FINDINGS: BONES AND ALIGNMENT: C5 to C7 anterior cervical discectomy and fusion surgical hardware. No acute fracture or traumatic malalignment. DEGENERATIVE CHANGES: Multilevel facet arthropathy. No severe osseous or foraminal or central canal stenosis. SOFT TISSUES: No prevertebral soft tissue swelling. IMPRESSION: 1. No acute findings. Electronically signed by: Morgane Naveau MD 06/16/2024 06:17 PM EST RP Workstation: [id number]   CT HEAD WO CONTRAST Result Date: 06/16/2024 EXAM: CT HEAD WITHOUT CONTRAST 06/16/2024 05:50:55 PM TECHNIQUE: CT of the head was performed without the administration of intravenous contrast. Automated exposure control, iterative  reconstruction, and/or weight based adjustment of the mA/kV was utilized to reduce the radiation dose to as low as reasonably achievable. COMPARISON: None available. CLINICAL HISTORY: Head trauma, moderate to severe. FINDINGS: BRAIN AND VENTRICLES: No acute hemorrhage. No evidence of acute infarct. Generalized volume loss with proportional passive expansion of extra-axial spaces including ventricular system, cortical sulci and basal cisterns. No hydrocephalus. No extra-axial collection. No mass effect or midline shift. Atherosclerotic  calcifications are present within the cavernous internal carotid and vertebral arteries. ORBITS: Right scleral buckle noted. SINUSES: No acute abnormality. SOFT TISSUES AND SKULL: A 1.5 cm right parietal occipital scalp hematoma. No skull fracture. IMPRESSION: 1. No acute intracranial abnormality. 2. A 1.5 cm right parietal occipital scalp hematoma. Electronically signed by: Morgane Naveau MD 06/16/2024 06:12 PM EST RP Workstation: [id number]   DG Ankle Left Port Result Date: 06/16/2024 EXAM: 1 or 2 VIEW(S) XRAY OF THE ANKLE 06/16/2024 05:22:00 PM CLINICAL HISTORY: Blunt trauma. COMPARISON: None available. FINDINGS: BONES AND JOINTS: Small plantar calcaneal spur. No acute fracture. No malalignment. SOFT TISSUES: Unremarkable. IMPRESSION: 1. No acute fracture or dislocation. Electronically signed by: Morgane Naveau MD 06/16/2024 05:36 PM EST RP Workstation: [id number]   DG Hand Complete Left Result Date: 06/16/2024 EXAM: 3 OR MORE VIEW(S) XRAY OF THE LEFT HAND 06/16/2024 05:22:00 PM COMPARISON: None available. CLINICAL HISTORY: Trauma. FINDINGS: BONES: Acute oblique fracture through the distal margin of the fourth middle phalanx. JOINTS: Moderate diffuse osteoarthritis, greatest throughout the interphalangeal joints. Moderate radiocarpal joint space narrowing with chondrocalcinosis. SOFT TISSUES: Associated overlying soft tissue edema and laceration. No retained radiopaque foreign  body. IMPRESSION: 1. Acute oblique fracture through the distal margin of the fourth middle phalanx with associated overlying soft tissue edema and laceration; no retained radiopaque foreign body. Electronically signed by: Morgane Naveau MD 06/16/2024 05:35 PM EST RP Workstation: [id number]   DG Pelvis Portable Result Date: 06/16/2024 EXAM: 1 OR 2 VIEW(S) XRAY OF THE PELVIS 06/16/2024 05:22:00 PM COMPARISON: None available. CLINICAL HISTORY: Trauma. FINDINGS: BONES AND JOINTS: Posterior fusion hardware throughout the lower lumbar spine and sacrum though the SI joints. No acute fracture. No malalignment. SOFT TISSUES: Unremarkable. IMPRESSION: 1. No evidence of acute traumatic injury. 2. Posterior fusion hardware throughout the lower lumbar spine and sacrum through the SI joints. Electronically signed by: Morgane Naveau MD 06/16/2024 05:33 PM EST RP Workstation: [id number]   DG Chest Port 1 View Result Date: 06/16/2024 EXAM: 1 VIEW(S) XRAY OF THE CHEST 06/16/2024 05:22:00 PM COMPARISON: CXR 05/27/2023. CLINICAL HISTORY: Trauma. FINDINGS: LUNGS AND PLEURA: Low lung volumes. Mild pulmonary edema versus bronchovascular crowding. Questioned right middle lobe airspace opacity versus patient rotation artifact No pleural effusion. No pneumothorax. HEART AND MEDIASTINUM: Cardiomegaly. No acute abnormality of the mediastinal silhouette. BONES AND SOFT TISSUES: Thoracolumbar spine hardware noted. Cervical spine hardware noted. Remote left rib fracture. Patient is rotated. No acute osseous abnormality. IMPRESSION: 1. Low lung volumes. 2. Questioned right middle lobe airspace opacity versus patient rotation artifact 3. Mild pulmonary edema versus bronchovascular crowding. Electronically signed by: Morgane Naveau MD 06/16/2024 05:32 PM EST RP Workstation: [id number]     Procedures   Medications Ordered in the ED  sodium chloride  0.9 % bolus 125 mL (0 mLs Intravenous Stopped 06/16/24 1848)  morphine  (PF) 4 MG/ML  injection 4 mg (4 mg Intravenous Given 06/16/24 1656)  ondansetron  (ZOFRAN ) injection 4 mg (4 mg Intravenous Given 06/16/24 1657)  iohexol  (OMNIPAQUE ) 350 MG/ML injection 75 mL (75 mLs Intravenous Contrast Given 06/16/24 1754)  HYDROmorphone  (DILAUDID ) injection 0.5 mg (0.5 mg Intravenous Given 06/16/24 1830)  oxyCODONE -acetaminophen  (PERCOCET/ROXICET) 5-325 MG per tablet 2 tablet (2 tablets Oral Given 06/16/24 2033)                                    Medical Decision Making Amount and/or Complexity of Data Reviewed Independent Historian: EMS External Data Reviewed: notes. Labs: ordered.  Decision-making details documented in ED Course. Radiology: ordered and independent interpretation performed. Decision-making details documented in ED Course. ECG/medicine tests: ordered and independent interpretation performed. Decision-making details documented in ED Course.  Risk Prescription drug management.   Pt with multiple medical problems and comorbidities and presenting today with a complaint that caries a high risk for morbidity and mortality.  Here today after the above issues and fall.  Patient seem to be in her normal state of health and her leg just gave out.  Did not seem to have a fall related to syncope or acute cardiac or lung issues.  Low suspicion for acute bleed.  Concern for fracture of her lumbar spine or rib fractures.  Patient is also having some chest pain.  Lower suspicion for renal hematoma.  Also concern for possible intracranial hemorrhage as she did injure her head as well.  Lower suspicion for hip fracture.  Patient does not take anticoagulation and mentating normally at this time.  She was given pain control.  Imaging is pending.  I independently interpreted patient's labs Chem-8 without acute findings, lactic acid is normal, CBC with stable hemoglobin of 11 and a normal white count, CMP with mild hyponatremia of 130 but normal creatinine. I have independently visualized and  interpreted pt's images today.  Head CT without intercranial hemorrhage, radiology reports 1.5 cm right parietal occipital scalp hematoma, cervical spine without fracture.  Patient CT of her chest abdomen pelvis shows no acute traumatic findings per radiology and chest x-ray without obvious signs of fracture.  Plain pelvis was negative for fracture and hand image showed a fracture of the middle phalanx of the fourth finger.  Left ankle imaging was negative.  Thoracic and lumbar images negative for fracture.  Discussed all the findings with the patient.  Fingers were buddy taped together.  Will ensure patient is able to ambulate.  She does have chronic pain and takes 5 mg of oxycodone  twice daily.  This is prescribed by her PCP and she is not under a pain contract. Patient was able to ambulate here.  She and her husband are both okay with her going home.  She was given additional pain medicine that she can take at home.  She will follow-up with PCP       Final diagnoses:  Fall, initial encounter  Scalp hematoma, initial encounter  Displaced fracture of middle phalanx of left ring finger, initial encounter for closed fracture  Contusion of lower back, initial encounter    ED Discharge Orders          Ordered    oxyCODONE  (ROXICODONE ) 5 MG immediate release tablet  Every 6 hours PRN        06/16/24 2105    methocarbamol  (ROBAXIN ) 500 MG tablet  2 times daily        06/16/24 2105               Doretha Folks, MD 06/16/24 2108  "

## 2024-06-16 NOTE — ED Notes (Signed)
 Patient transported to CT

## 2024-06-16 NOTE — Discharge Instructions (Signed)
 You do have a broken finger but no other broken bones.  However you will be very sore related to the fall.  You can take your oxycodone  the way you normally do but may have to increase your dose to 10 mg or take the 5 mg more frequently.  You can also add in the muscle relaxer as needed as well as extra strength Tylenol .  If you start having recurrent vomiting, confusion, severe headaches you would need to return to the emergency room.  Make sure you follow-up with your doctor next week once the pain starts to improve.

## 2024-06-20 ENCOUNTER — Ambulatory Visit: Payer: Self-pay | Admitting: Family Medicine

## 2024-06-20 ENCOUNTER — Other Ambulatory Visit: Payer: Self-pay | Admitting: Family Medicine

## 2024-06-20 ENCOUNTER — Telehealth: Payer: Self-pay

## 2024-06-20 MED ORDER — OXYCODONE HCL 5 MG PO TABS: 5.0000 mg | ORAL_TABLET | Freq: Four times a day (QID) | ORAL | 0 refills | Status: AC | PRN

## 2024-06-20 NOTE — Telephone Encounter (Signed)
 FYI Only or Action Required?: FYI only for provider: appointment scheduled on 06/22/24, requesting call from Ronal Bradley and pain medication .  Patient was last seen in primary care on 05/01/2024 by Duanne Butler DASEN, MD.  Called Nurse Triage reporting Back Pain.  Symptoms began several days ago.  Interventions attempted: Prescription medications: Oxycodone , Robaxin .  Symptoms are: unchanged.  Triage Disposition: See PCP When Office is Open (Within 3 Days) (overriding See HCP Within 4 Hours (Or PCP Triage))  Patient/caregiver understands and will follow disposition?: Yes   Reason for Disposition  [1] SEVERE back pain (e.g., excruciating, unable to do any normal activities) AND [2] not improved 2 hours after pain medicine  Answer Assessment - Initial Assessment Questions Pt's husband calling in today, patient is in a great deal of pain from fall on Friday, evaluated in ED. Left ringer finger fracture, all other imaging negative. Denies new or worsening symptoms.  ER f/u scheduled with PCP for 1/29, pt is requesting pain medication to be sent to [facility] in the mean time. She is also requesting a call back from [facility]. Please advise.  1. ONSET: When did the pain begin? (e.g., minutes, hours, days)    Friday, had a fall  2. LOCATION: Where does it hurt? (upper, mid or lower back)     Mid-lower back, rib cage, left ring finger  3. SEVERITY: How bad is the pain?  (e.g., Scale 1-10; mild, moderate, or severe)     9/10  4. PATTERN: Is the pain constant? (e.g., yes, no; constant, intermittent)      Constant  5. CAUSE:  What do you think is causing the back pain?      Had a fall on Friday  6. MEDICINES: What have you taken so far for the pain? (e.g., nothing, acetaminophen , NSAIDS)     Oxycodone  5mg , Robaxin  500 mg.  7. NEUROLOGIC SYMPTOMS: Do you have any weakness, numbness, or problems with bowel/bladder control?     Denies  8. OTHER SYMPTOMS: Do you have any  other symptoms? (e.g., fever, abdomen pain, burning with urination, blood in urine)       Denies  Protocols used: Back Pain-A-AH  Message from Darshell M sent at 06/20/2024  1:23 PM EST  Summary: Sever Pain   Reason for Triage: Patient fell on Friday and was in ER. She is home now put in extreme pain. Has not had Hospital f/u. Was taking oxycodone  5mg  prescribed in ER but is now out of medication.

## 2024-06-20 NOTE — Telephone Encounter (Signed)
 Copied from CRM #[id number]. Topic: General - Other >> Jun 20, 2024  9:24 AM Pinkey ORN wrote: Reason for CRM: Requesting Office Call Back >> Jun 20, 2024  9:25 AM Pinkey ORN wrote: Auston husband [phone] Is requesting an office call from Ronal Bradley. States he needs to speak with her about his wife.

## 2024-06-22 ENCOUNTER — Encounter: Payer: Self-pay | Admitting: Family Medicine

## 2024-06-22 ENCOUNTER — Ambulatory Visit: Admitting: Family Medicine

## 2024-06-22 VITALS — BP 130/82 | HR 87 | Ht 63.0 in | Wt 194.0 lb

## 2024-06-22 DIAGNOSIS — M545 Low back pain, unspecified: Secondary | ICD-10-CM

## 2024-06-22 MED ORDER — OXYCODONE-ACETAMINOPHEN 5-325 MG PO TABS: 1.0000 | ORAL_TABLET | Freq: Three times a day (TID) | ORAL | 0 refills | Status: AC | PRN

## 2024-06-22 NOTE — Progress Notes (Signed)
 "  Subjective:    Patient ID: Janet Peterson, female    DOB: [birth year], 74 y.o.   MRN: [id number]  Fall   Patient recently was walking up the steps going into her house.  She lost her balance and fell directly backwards landing on her back and striking her right occiput on the concrete.  She did not lose consciousness.  However due to severe pain she went to the emergency room.  CT scan of the brain revealed a 1.5 cm hematoma on the right parietal scalp.  CT scan of the neck was negative.  CT scan of the chest was unremarkable.  CT scan of the abdomen and pelvis revealed no acute musculoskeletal injuries.  Patient has a history of lumbar fusion.  Patient reports severe pain in her back roughly at the level of L3-L4 and L5.  The pain is intense and radiates in a bandlike fashion from her left side to her right side.  She denies any saddle anesthesias or bowel or bladder incontinence.  She denies any leg weakness or leg numbness or sciatica symptoms. Past Medical History:  Diagnosis Date   Anemia    Ankylosing spondylitis (HCC)    Anxiety and depression    Cancer (HCC)    basal cell skin cancer   Depression    Dyspnea    Fibromyalgia    GERD (gastroesophageal reflux disease)    Hiatal hernia    per patient, dx by GI    History of basal cell carcinoma excision    FACE, 1992  &  1996   History of hiatal hernia    History of thrush    Hyperlipidemia    Hypertension    Insomnia    Left breast mass    OCD (obsessive compulsive disorder)    Osteopenia    PONV (postoperative nausea and vomiting)    Psoriatic arthritis (HCC)    PVC (premature ventricular contraction)    Restrictive lung disease    Rheumatoid arthritis (HCC)    Tardive dyskinesia    Past Surgical History:  Procedure Laterality Date   BIOPSY  09/29/2019   Procedure: BIOPSY;  Surgeon: Saintclair Jasper, MD;  Location: [facility];  Service: Gastroenterology;;   BREAST EXCISIONAL BIOPSY Right    BREAST EXCISIONAL BIOPSY Left  05/04/2018   BREAST LUMPECTOMY WITH RADIOACTIVE SEED LOCALIZATION Left 05/04/2018   Procedure: LEFT BREAST LUMPECTOMY WITH RADIOACTIVE SEED LOCALIZATION AND LEFT BREAST NIPPLE BIOPSY;  Surgeon: Curvin Deward MOULD, MD;  Location: [facility];  Service: General;  Laterality: Left;   BREAST SURGERY  05/25/1973   lumpectomy-- benign   BUNIONECTOMY  05/25/1989   CATARACT EXTRACTION W/ INTRAOCULAR LENS  IMPLANT, BILATERAL  05/25/1998   CERVICAL FUSION  07/24/2011   C5 -- C7   CESAREAN SECTION  05/26/1983   COLONOSCOPY WITH PROPOFOL  N/A 09/29/2019   Procedure: COLONOSCOPY WITH PROPOFOL ;  Surgeon: Saintclair Jasper, MD;  Location: [facility];  Service: Gastroenterology;  Laterality: N/A;   DISTAL INTERPHALANGEAL JOINT FUSION Right 03/14/2015   Procedure: RIGHT LONG FINGER DISTAL INTERPHALANGEAL JOINT ARTHRODESIS;  Surgeon: Prentice Pagan, MD;  Location: [facility];  Service: Orthopedics;  Laterality: Right;   ESOPHAGOGASTRODUODENOSCOPY (EGD) WITH PROPOFOL  N/A 09/29/2019   Procedure: ESOPHAGOGASTRODUODENOSCOPY (EGD) WITH PROPOFOL ;  Surgeon: Saintclair Jasper, MD;  Location: [facility];  Service: Gastroenterology;  Laterality: N/A;   EYE SURGERY  05/25/1993   rk (laser surgery), semi cornea transplant, detacted retina,  fluid removal   HYSTEROSCOPY WITH D &  C N/A 12/09/2020   Procedure: DILATATION AND CURETTAGE /HYSTEROSCOPY;  Surgeon: Storm Setter, DO;  Location: [facility];  Service: Gynecology;  Laterality: N/A;   KNEE ARTHROSCOPY Left 03/03/2004   POLYPECTOMY  09/29/2019   Procedure: POLYPECTOMY;  Surgeon: Saintclair Jasper, MD;  Location: [facility];  Service: Gastroenterology;;   POSTERIOR VITRECTOMY RIGHT EYE AND LASER   10/27/1999   right foot surgery      SHOULDER SURGERY Right 05/26/1995   SKIN BIOPSY  05/2022   lower stomach   SPINAL FIXATION SURGERY W/ IMPLANT  2013 rod #1//  2014  rod 2   S1 -- T10  (rod #1)//   S1 -- T4 (rod #2)   THORACIC FUSION   03/13/2013   REMOVAL HARDWARE/  BONE GRAFT FUSION T10//  REVISION OF RODS   TOTAL KNEE ARTHROPLASTY Left 12/14/2005   TOTAL KNEE ARTHROPLASTY Right 08/20/2023   Procedure: ARTHROPLASTY, KNEE, TOTAL;  Surgeon: Kay Kemps, MD;  Location: [facility];  Service: Orthopedics;  Laterality: Right;   UVULOPALATOPHARYNGOPLASTY  04/26/2006   w/  TONSILLECTOMY/  TURBINATE REDUCTIONS/  BILATERAL ANTERIOR ETHMOIDECTOMY   Current Outpatient Medications on File Prior to Visit  Medication Sig Dispense Refill   amLODipine  (NORVASC ) 5 MG tablet Take 1 tablet (5 mg total) by mouth daily. 90 tablet 1   ammonium lactate  (AMLACTIN DAILY) 12 % lotion Apply 1 Application topically as needed. 400 g 0   Budeson-Glycopyrrol-Formoterol  (BREZTRI  AEROSPHERE) 160-9-4.8 MCG/ACT AERO Inhale 2 puffs into the lungs in the morning and at bedtime. 3 each 3   buPROPion  (WELLBUTRIN  XL) 300 MG 24 hr tablet Take 300 mg by mouth every morning.  1   clonazePAM  (KLONOPIN ) 1 MG tablet TAKE 1 TABLET BY MOUTH EVERY DAY AS NEEDED FOR ANXIETY 30 tablet 1   cyclobenzaprine  (FLEXERIL ) 10 MG tablet TAKE 1 TABLET BY MOUTH THREE TIMES A DAY AS NEEDED FOR MUSCLE SPASMS (Patient taking differently: PRN) 30 tablet 0   dexlansoprazole  (DEXILANT ) 60 MG capsule Take 1 capsule (60 mg total) by mouth daily. 90 capsule 3   doxepin  (SINEQUAN ) 50 MG capsule Take 100 mg by mouth at bedtime.     Evolocumab  (REPATHA  SURECLICK) 140 MG/ML SOAJ Inject 140 mg into the skin every 14 (fourteen) days. 2 mL 11   ezetimibe  (ZETIA ) 10 MG tablet TAKE 1 TABLET BY MOUTH EVERY DAY 90 tablet 2   FLUoxetine  (PROZAC ) 20 MG capsule Take 60 mg by mouth at bedtime.     fluticasone  (FLONASE ) 50 MCG/ACT nasal spray Place 2 sprays into both nostrils daily as needed for allergies. (Patient taking differently: Place 2 sprays into both nostrils daily as needed for allergies. Using everday) 16 g 11   leflunomide  (ARAVA ) 20 MG tablet TAKE 1 TABLET BY MOUTH EVERY DAY 90 tablet 0   LINZESS  72  MCG capsule TAKE 1 CAPSULE BY MOUTH DAILY BEFORE BREAKFAST. 90 capsule 3   methocarbamol  (ROBAXIN ) 500 MG tablet Take 1 tablet (500 mg total) by mouth 2 (two) times daily. 20 tablet 0   metoprolol  succinate (TOPROL -XL) 50 MG 24 hr tablet TAKE 1 TABLET BY MOUTH EVERY DAY WITH A MEAL 90 tablet 0   montelukast  (SINGULAIR ) 10 MG tablet Take 1 tablet (10 mg total) by mouth at bedtime. 30 tablet 11   ondansetron  (ZOFRAN ) 4 MG tablet Take 1 tablet (4 mg total) by mouth 4 (four) times daily as needed for nausea. 90 tablet 1   oxyCODONE  (ROXICODONE ) 5 MG immediate release tablet Take 1 tablet (  5 mg total) by mouth every 6 (six) hours as needed for severe pain (pain score 7-10). 15 tablet 0   spironolactone  (ALDACTONE ) 25 MG tablet Take 25 mg by mouth daily.     Tirzepatide -Weight Management (ZEPBOUND ) 15 MG/0.5ML SOLN Inject 15 mg into the skin once a week. 2 mL 11   valbenazine  (INGREZZA ) 80 MG capsule Take 1 capsule (80 mg total) by mouth daily. 90 capsule 3   valsartan  (DIOVAN ) 320 MG tablet Take 1 tablet (320 mg total) by mouth daily. 90 tablet 3   White Petrolatum-Mineral Oil (REFRESH P.M. OP) Place 1 drop into both eyes every 6 (six) hours as needed (dry eyes).     clotrimazole  (LOTRIMIN ) 1 % cream APPLY TO AFFECTED AREA TWICE A DAY (Patient not taking: Reported on 05/01/2024) 30 g 0   Ferrous Sulfate (IRON PO) Take by mouth. (Patient not taking: Reported on 06/22/2024)     No current facility-administered medications on file prior to visit.   Allergies  Allergen Reactions   Latex Rash    Mouth sores   Amlodipine  Besy-Benazepril Hcl Cough    No angioedema   Amoxicillin-Pot Clavulanate Diarrhea and Nausea And Vomiting   Bextra [Valdecoxib] Diarrhea and Nausea And Vomiting    Acid reflux   Hydrochlorothiazide      Low sodium   Lipitor [Atorvastatin Calcium] Other (See Comments)    MYALGIAS   Molds & Smuts    Spironolactone      Hyponatremia   Sulfa Antibiotics Nausea And Vomiting    Cramping  and stomach hurts   Zocor [Simvastatin] Other (See Comments)    MYALGIA   Chlorhexidine  Rash    Rash all over body. Should not take topical or mouthwash   Social History   Socioeconomic History   Marital status: Married    Spouse name: Daryl   Number of children: Not on file   Years of education: Not on file   Highest education level: Bachelor's degree (e.g., BA, AB, BS)  Occupational History    Comment: retired  Tobacco Use   Smoking status: Former    Current packs/day: 0.00    Average packs/day: 0.5 packs/day for 10.0 years (5.0 ttl pk-yrs)    Types: Cigarettes    Start date: 03/06/1985    Quit date: 03/07/1995    Years since quitting: 29.3    Passive exposure: Never   Smokeless tobacco: Never  Vaping Use   Vaping status: Never Used  Substance and Sexual Activity   Alcohol use: Yes    Comment: rare   Drug use: Never   Sexual activity: Not Currently  Other Topics Concern   Not on file  Social History Narrative   Lives with husband   Social Drivers of Health   Tobacco Use: Medium Risk (06/22/2024)   Patient History    Smoking Tobacco Use: Former    Smokeless Tobacco Use: Never    Passive Exposure: Never  Physicist, Medical Strain: Low Risk (08/19/2023)   Overall Financial Resource Strain (CARDIA)    Difficulty of Paying Living Expenses: Not hard at all  Food Insecurity: No Food Insecurity (08/20/2023)   Hunger Vital Sign    Worried About Running Out of Food in the Last Year: Never true    Ran Out of Food in the Last Year: Never true  Transportation Needs: No Transportation Needs (08/20/2023)   PRAPARE - Administrator, Civil Service (Medical): No    Lack of Transportation (Non-Medical): No  Physical Activity: Inactive (08/19/2023)  Exercise Vital Sign    Days of Exercise per Week: 0 days    Minutes of Exercise per Session: 0 min  Stress: Stress Concern Present (08/19/2023)   [employer] of Occupational Health - Occupational Stress Questionnaire     Feeling of Stress : To some extent  Social Connections: Socially Integrated (08/20/2023)   Social Connection and Isolation Panel    Frequency of Communication with Friends and Family: Twice a week    Frequency of Social Gatherings with Friends and Family: Once a week    Attends Religious Services: More than 4 times per year    Active Member of [employer] or Organizations: No    Attends Engineer, Structural: More than 4 times per year    Marital Status: Married  Catering Manager Violence: Not At Risk (08/20/2023)   Humiliation, Afraid, Rape, and Kick questionnaire    Fear of Current or Ex-Partner: No    Emotionally Abused: No    Physically Abused: No    Sexually Abused: No  Depression (PHQ2-9): Low Risk (02/25/2024)   Depression (PHQ2-9)    PHQ-2 Score: 0  Alcohol Screen: Low Risk (08/19/2023)   Alcohol Screen    Last Alcohol Screening Score (AUDIT): 2  Housing: Low Risk (08/20/2023)   Housing Stability Vital Sign    Unable to Pay for Housing in the Last Year: No    Number of Times Moved in the Last Year: 0    Homeless in the Last Year: No  Utilities: Not At Risk (08/20/2023)   [facility] Utilities    Threatened with loss of utilities: No  Health Literacy: Adequate Health Literacy (08/19/2023)   B1300 Health Literacy    Frequency of need for help with medical instructions: Never    Review of Systems  All other systems reviewed and are negative.      Objective:   Physical Exam Vitals reviewed.  Constitutional:      Appearance: Normal appearance.  Cardiovascular:     Rate and Rhythm: Normal rate and regular rhythm.     Heart sounds: Normal heart sounds.  Pulmonary:     Effort: Pulmonary effort is normal.     Breath sounds: Normal breath sounds.  Musculoskeletal:     Lumbar back: Spasms and tenderness present. No bony tenderness. Decreased range of motion.       Back:  Skin:    Findings: Rash is not crusting, papular, pustular or vesicular.  Neurological:     Mental  Status: She is alert.           Assessment & Plan:  Acute bilateral low back pain without sciatica I believe this is primarily a muscle contusion.  There is no evidence of cauda equina syndrome or lumbar radiculopathy.  Patient denies any hematuria.  She denies any melena or hematochezia.  She denies any pleurisy or shortness of breath.  Therefore we will treat the pain symptomatically with Percocet 5/325 1 p.o. every 6 hours as needed pain. "

## 2024-06-25 ENCOUNTER — Other Ambulatory Visit: Payer: Self-pay | Admitting: Family Medicine

## 2024-06-25 DIAGNOSIS — I10 Essential (primary) hypertension: Secondary | ICD-10-CM

## 2024-06-27 ENCOUNTER — Ambulatory Visit: Payer: Self-pay

## 2024-06-27 NOTE — Telephone Encounter (Signed)
 FYI Only or Action Required?: Action required by provider: declines appt, requesting antibiotics.  Patient was last seen in primary care on 06/22/2024 by Duanne Butler DASEN, MD.  Called Nurse Triage reporting No chief complaint on file..  Symptoms began today.  Interventions attempted: Prescription medications: oxy.  Symptoms are: gradually worsening.  Triage Disposition: No disposition on file.  Patient/caregiver understands and will follow disposition?:   Reason for Triage: possible uti. Pain in kidney area, itching, irration, frequently urinating  Reason for Disposition  Side (flank) or lower back pain present  Answer Assessment - Initial Assessment Questions Pt asking if husband can pick up a urine cup and bring urine back or if Dr Duanne would call in antibiotics. Explained that she would need to come in for eval. She declined any appt. States will call back later.   1. SEVERITY: How bad is the pain?  (e.g., Scale 1-10; mild, moderate, or severe)     Moderate 2. FREQUENCY: How many times have you had painful urination today?      Urinating q45 min 3. PATTERN: Is pain present every time you urinate or just sometimes?      yes 5. FEVER: Do you have a fever? If Yes, ask: What is your temperature, how was it measured, and when did it start?     denes 7. CAUSE: What do you think is causing the painful urination?  (e.g., UTI, scratch, Herpes sore)     UTI 8. OTHER SYMPTOMS: Do you have any other symptoms? (e.g., blood in urine, flank pain, genital sores, urgency, vaginal discharge)     Low back tenderness  Protocols used: Urination Pain - Female-A-AH

## 2024-06-28 ENCOUNTER — Other Ambulatory Visit

## 2024-06-28 DIAGNOSIS — R300 Dysuria: Secondary | ICD-10-CM

## 2024-06-29 ENCOUNTER — Ambulatory Visit: Payer: Self-pay | Admitting: Family Medicine

## 2024-06-29 ENCOUNTER — Other Ambulatory Visit: Payer: Self-pay | Admitting: Family Medicine

## 2024-06-29 LAB — URINALYSIS, ROUTINE W REFLEX MICROSCOPIC
Bacteria, UA: NONE SEEN /HPF
Bilirubin Urine: NEGATIVE
Glucose, UA: NEGATIVE
Hgb urine dipstick: NEGATIVE
Hyaline Cast: NONE SEEN /LPF
Ketones, ur: NEGATIVE
Nitrite: NEGATIVE
Protein, ur: NEGATIVE
Specific Gravity, Urine: 1.007 (ref 1.001–1.035)
pH: 7 (ref 5.0–8.0)

## 2024-06-29 LAB — MICROSCOPIC MESSAGE

## 2024-06-29 LAB — URINE CULTURE
MICRO NUMBER:: 17548168
SPECIMEN QUALITY:: ADEQUATE

## 2024-06-29 MED ORDER — NITROFURANTOIN MONOHYD MACRO 100 MG PO CAPS: 100.0000 mg | ORAL_CAPSULE | Freq: Two times a day (BID) | ORAL | 0 refills | Status: AC

## 2024-08-24 ENCOUNTER — Encounter

## 2024-09-04 ENCOUNTER — Ambulatory Visit: Admitting: Physician Assistant
